# Patient Record
Sex: Female | Born: 1935
Health system: Southern US, Community
[De-identification: ages and names within clinical notes are randomized; demographics above are authoritative.]

## PROBLEM LIST (undated history)

## (undated) DIAGNOSIS — N182 Chronic kidney disease, stage 2 (mild): Secondary | ICD-10-CM

## (undated) DIAGNOSIS — I10 Essential (primary) hypertension: Secondary | ICD-10-CM

## (undated) DIAGNOSIS — F419 Anxiety disorder, unspecified: Secondary | ICD-10-CM

## (undated) DIAGNOSIS — I639 Cerebral infarction, unspecified: Secondary | ICD-10-CM

## (undated) DIAGNOSIS — I4891 Unspecified atrial fibrillation: Secondary | ICD-10-CM

## (undated) DIAGNOSIS — K449 Diaphragmatic hernia without obstruction or gangrene: Secondary | ICD-10-CM

## (undated) DIAGNOSIS — K589 Irritable bowel syndrome without diarrhea: Secondary | ICD-10-CM

## (undated) DIAGNOSIS — K219 Gastro-esophageal reflux disease without esophagitis: Secondary | ICD-10-CM

## (undated) DIAGNOSIS — E119 Type 2 diabetes mellitus without complications: Secondary | ICD-10-CM

## (undated) DIAGNOSIS — E785 Hyperlipidemia, unspecified: Secondary | ICD-10-CM

## (undated) DIAGNOSIS — I69320 Aphasia following cerebral infarction: Secondary | ICD-10-CM

## (undated) DIAGNOSIS — E559 Vitamin D deficiency, unspecified: Secondary | ICD-10-CM

## (undated) HISTORY — DX: Aphasia following cerebral infarction: I69.320

## (undated) HISTORY — PX: CATARACT EXTRACTION, BILATERAL: SHX1313

## (undated) HISTORY — DX: Unspecified atrial fibrillation: I48.91

## (undated) HISTORY — DX: Essential (primary) hypertension: I10

## (undated) HISTORY — DX: Gastro-esophageal reflux disease without esophagitis: K21.9

## (undated) HISTORY — DX: Cerebral infarction, unspecified: I63.9

## (undated) HISTORY — DX: Diaphragmatic hernia without obstruction or gangrene: K44.9

## (undated) HISTORY — DX: Hyperlipidemia, unspecified: E78.5

## (undated) HISTORY — DX: Type 2 diabetes mellitus without complications: E11.9

## (undated) HISTORY — PX: CHOLECYSTECTOMY: SHX55

## (undated) HISTORY — PX: TONSILLECTOMY: SUR1361

## (undated) HISTORY — DX: Irritable bowel syndrome, unspecified: K58.9

## (undated) HISTORY — DX: Vitamin D deficiency, unspecified: E55.9

## (undated) HISTORY — DX: Anxiety disorder, unspecified: F41.9

---

## 1997-10-09 ENCOUNTER — Other Ambulatory Visit: Admission: RE | Admit: 1997-10-09 | Discharge: 1997-10-09 | Payer: Self-pay | Admitting: Internal Medicine

## 1997-11-02 ENCOUNTER — Other Ambulatory Visit: Admission: RE | Admit: 1997-11-02 | Discharge: 1997-11-02 | Payer: Self-pay | Admitting: Internal Medicine

## 1997-11-19 ENCOUNTER — Ambulatory Visit (HOSPITAL_COMMUNITY): Admission: RE | Admit: 1997-11-19 | Discharge: 1997-11-19 | Payer: Self-pay | Admitting: Internal Medicine

## 1998-12-09 ENCOUNTER — Encounter: Payer: Self-pay | Admitting: Internal Medicine

## 1998-12-09 ENCOUNTER — Ambulatory Visit (HOSPITAL_COMMUNITY): Admission: RE | Admit: 1998-12-09 | Discharge: 1998-12-09 | Payer: Self-pay | Admitting: Internal Medicine

## 1999-05-06 ENCOUNTER — Other Ambulatory Visit: Admission: RE | Admit: 1999-05-06 | Discharge: 1999-05-06 | Payer: Self-pay | Admitting: Internal Medicine

## 1999-12-12 ENCOUNTER — Ambulatory Visit (HOSPITAL_COMMUNITY): Admission: RE | Admit: 1999-12-12 | Discharge: 1999-12-12 | Payer: Self-pay | Admitting: Internal Medicine

## 1999-12-12 ENCOUNTER — Encounter: Payer: Self-pay | Admitting: Internal Medicine

## 2000-12-13 ENCOUNTER — Encounter: Payer: Self-pay | Admitting: Internal Medicine

## 2000-12-13 ENCOUNTER — Ambulatory Visit (HOSPITAL_COMMUNITY): Admission: RE | Admit: 2000-12-13 | Discharge: 2000-12-13 | Payer: Self-pay | Admitting: Internal Medicine

## 2001-12-26 ENCOUNTER — Ambulatory Visit (HOSPITAL_COMMUNITY): Admission: RE | Admit: 2001-12-26 | Discharge: 2001-12-26 | Payer: Self-pay | Admitting: Internal Medicine

## 2001-12-26 ENCOUNTER — Encounter: Payer: Self-pay | Admitting: Internal Medicine

## 2002-12-31 ENCOUNTER — Ambulatory Visit (HOSPITAL_COMMUNITY): Admission: RE | Admit: 2002-12-31 | Discharge: 2002-12-31 | Payer: Self-pay | Admitting: Internal Medicine

## 2002-12-31 ENCOUNTER — Encounter: Payer: Self-pay | Admitting: Internal Medicine

## 2003-05-30 HISTORY — PX: COLONOSCOPY: SHX174

## 2003-05-30 HISTORY — PX: UPPER GASTROINTESTINAL ENDOSCOPY: SHX188

## 2004-01-04 ENCOUNTER — Ambulatory Visit (HOSPITAL_COMMUNITY): Admission: RE | Admit: 2004-01-04 | Discharge: 2004-01-04 | Payer: Self-pay | Admitting: Internal Medicine

## 2004-01-05 ENCOUNTER — Ambulatory Visit (HOSPITAL_COMMUNITY): Admission: RE | Admit: 2004-01-05 | Discharge: 2004-01-05 | Payer: Self-pay | Admitting: Gastroenterology

## 2004-01-21 ENCOUNTER — Ambulatory Visit (HOSPITAL_COMMUNITY): Admission: RE | Admit: 2004-01-21 | Discharge: 2004-01-21 | Payer: Self-pay | Admitting: Gastroenterology

## 2004-09-06 ENCOUNTER — Other Ambulatory Visit: Admission: RE | Admit: 2004-09-06 | Discharge: 2004-09-06 | Payer: Self-pay | Admitting: Internal Medicine

## 2005-01-11 ENCOUNTER — Ambulatory Visit (HOSPITAL_COMMUNITY): Admission: RE | Admit: 2005-01-11 | Discharge: 2005-01-11 | Payer: Self-pay | Admitting: Internal Medicine

## 2006-01-12 ENCOUNTER — Ambulatory Visit (HOSPITAL_COMMUNITY): Admission: RE | Admit: 2006-01-12 | Discharge: 2006-01-12 | Payer: Self-pay | Admitting: Internal Medicine

## 2007-01-16 ENCOUNTER — Ambulatory Visit (HOSPITAL_COMMUNITY): Admission: RE | Admit: 2007-01-16 | Discharge: 2007-01-16 | Payer: Self-pay | Admitting: Internal Medicine

## 2007-06-07 ENCOUNTER — Ambulatory Visit (HOSPITAL_COMMUNITY): Admission: RE | Admit: 2007-06-07 | Discharge: 2007-06-07 | Payer: Self-pay | Admitting: Internal Medicine

## 2008-01-17 ENCOUNTER — Ambulatory Visit (HOSPITAL_COMMUNITY): Admission: RE | Admit: 2008-01-17 | Discharge: 2008-01-17 | Payer: Self-pay | Admitting: Internal Medicine

## 2009-01-18 ENCOUNTER — Ambulatory Visit (HOSPITAL_COMMUNITY): Admission: RE | Admit: 2009-01-18 | Discharge: 2009-01-18 | Payer: Self-pay | Admitting: Internal Medicine

## 2009-05-02 ENCOUNTER — Emergency Department (HOSPITAL_COMMUNITY): Admission: EM | Admit: 2009-05-02 | Discharge: 2009-05-03 | Payer: Self-pay | Admitting: Emergency Medicine

## 2010-01-20 ENCOUNTER — Ambulatory Visit (HOSPITAL_COMMUNITY): Admission: RE | Admit: 2010-01-20 | Discharge: 2010-01-20 | Payer: Self-pay | Admitting: Internal Medicine

## 2010-02-21 ENCOUNTER — Inpatient Hospital Stay (HOSPITAL_COMMUNITY): Admission: EM | Admit: 2010-02-21 | Discharge: 2010-02-25 | Payer: Self-pay | Admitting: Emergency Medicine

## 2010-02-21 ENCOUNTER — Ambulatory Visit: Payer: Self-pay | Admitting: Cardiology

## 2010-02-22 ENCOUNTER — Encounter (INDEPENDENT_AMBULATORY_CARE_PROVIDER_SITE_OTHER): Payer: Self-pay | Admitting: Diagnostic Neuroimaging

## 2010-02-22 ENCOUNTER — Ambulatory Visit: Payer: Self-pay | Admitting: Physical Medicine & Rehabilitation

## 2010-02-25 ENCOUNTER — Inpatient Hospital Stay (HOSPITAL_COMMUNITY)
Admission: EM | Admit: 2010-02-25 | Discharge: 2010-03-03 | Payer: Self-pay | Source: Home / Self Care | Admitting: Physical Medicine & Rehabilitation

## 2010-03-07 ENCOUNTER — Ambulatory Visit: Payer: Self-pay | Admitting: Cardiology

## 2010-03-07 DIAGNOSIS — Z8673 Personal history of transient ischemic attack (TIA), and cerebral infarction without residual deficits: Secondary | ICD-10-CM | POA: Insufficient documentation

## 2010-03-07 LAB — CONVERTED CEMR LAB: POC INR: 6.2

## 2010-03-08 ENCOUNTER — Encounter
Admission: RE | Admit: 2010-03-08 | Discharge: 2010-05-18 | Payer: Self-pay | Source: Home / Self Care | Attending: Physical Medicine & Rehabilitation | Admitting: Physical Medicine & Rehabilitation

## 2010-03-11 ENCOUNTER — Ambulatory Visit: Payer: Self-pay | Admitting: Cardiology

## 2010-03-15 DIAGNOSIS — I10 Essential (primary) hypertension: Secondary | ICD-10-CM | POA: Insufficient documentation

## 2010-03-15 DIAGNOSIS — I4891 Unspecified atrial fibrillation: Secondary | ICD-10-CM | POA: Insufficient documentation

## 2010-03-15 DIAGNOSIS — I69920 Aphasia following unspecified cerebrovascular disease: Secondary | ICD-10-CM | POA: Insufficient documentation

## 2010-03-15 DIAGNOSIS — F411 Generalized anxiety disorder: Secondary | ICD-10-CM | POA: Insufficient documentation

## 2010-03-15 DIAGNOSIS — E1129 Type 2 diabetes mellitus with other diabetic kidney complication: Secondary | ICD-10-CM | POA: Insufficient documentation

## 2010-03-16 ENCOUNTER — Ambulatory Visit: Payer: Self-pay | Admitting: Internal Medicine

## 2010-03-16 LAB — CONVERTED CEMR LAB: POC INR: 2.8

## 2010-03-17 ENCOUNTER — Ambulatory Visit: Payer: Self-pay | Admitting: Cardiology

## 2010-03-22 ENCOUNTER — Encounter
Admission: RE | Admit: 2010-03-22 | Discharge: 2010-03-28 | Payer: Self-pay | Source: Home / Self Care | Attending: Physical Medicine & Rehabilitation | Admitting: Physical Medicine & Rehabilitation

## 2010-03-23 ENCOUNTER — Ambulatory Visit: Payer: Self-pay | Admitting: Internal Medicine

## 2010-03-23 LAB — CONVERTED CEMR LAB: POC INR: 3.4

## 2010-03-28 ENCOUNTER — Ambulatory Visit: Payer: Self-pay | Admitting: Physical Medicine & Rehabilitation

## 2010-03-30 ENCOUNTER — Ambulatory Visit: Payer: Self-pay | Admitting: Cardiology

## 2010-04-05 ENCOUNTER — Encounter: Payer: Self-pay | Admitting: Cardiology

## 2010-04-12 ENCOUNTER — Ambulatory Visit: Payer: Self-pay | Admitting: Cardiovascular Disease

## 2010-04-12 LAB — CONVERTED CEMR LAB: POC INR: 3.3

## 2010-04-14 ENCOUNTER — Ambulatory Visit (HOSPITAL_COMMUNITY)
Admission: RE | Admit: 2010-04-14 | Discharge: 2010-04-14 | Payer: Self-pay | Source: Home / Self Care | Admitting: Physical Medicine & Rehabilitation

## 2010-04-26 ENCOUNTER — Ambulatory Visit: Payer: Self-pay | Admitting: Cardiology

## 2010-04-26 LAB — CONVERTED CEMR LAB: POC INR: 2

## 2010-05-10 ENCOUNTER — Ambulatory Visit: Payer: Self-pay | Admitting: Internal Medicine

## 2010-06-07 ENCOUNTER — Ambulatory Visit: Admission: RE | Admit: 2010-06-07 | Discharge: 2010-06-07 | Payer: Self-pay | Source: Home / Self Care

## 2010-06-18 ENCOUNTER — Encounter: Payer: Self-pay | Admitting: Physical Medicine & Rehabilitation

## 2010-06-21 ENCOUNTER — Ambulatory Visit: Admission: RE | Admit: 2010-06-21 | Discharge: 2010-06-21 | Payer: Self-pay | Source: Home / Self Care

## 2010-06-21 ENCOUNTER — Ambulatory Visit
Admission: RE | Admit: 2010-06-21 | Discharge: 2010-06-21 | Payer: Self-pay | Source: Home / Self Care | Attending: Cardiology | Admitting: Cardiology

## 2010-06-21 ENCOUNTER — Encounter: Payer: Self-pay | Admitting: Cardiology

## 2010-06-21 ENCOUNTER — Encounter
Admission: RE | Admit: 2010-06-21 | Discharge: 2010-06-28 | Payer: Self-pay | Source: Home / Self Care | Attending: Physical Medicine & Rehabilitation | Admitting: Physical Medicine & Rehabilitation

## 2010-06-28 ENCOUNTER — Ambulatory Visit
Admission: RE | Admit: 2010-06-28 | Discharge: 2010-06-28 | Payer: Self-pay | Source: Home / Self Care | Attending: Physical Medicine & Rehabilitation | Admitting: Physical Medicine & Rehabilitation

## 2010-06-28 NOTE — Medication Information (Signed)
Summary: ccn/g d  Anticoagulant Therapy  Managed by: Weston Brass, PharmD Referring MD: Daleen Squibb Supervising MD: Antoine Poche MD, [name] Indication 1: CVA Indication 2: Atrial Fibrillation Lab Used: [facility] [place] Site: [address] PT 86.4 INR POC 6.2 INR RANGE 2.0-3.0  Dietary changes: yes       Details: on nectar-thick liquids  Health status changes: no    Bleeding/hemorrhagic complications: no    Recent/future hospitalizations: yes       Details: was in hospital and inpatient rehab for CVA- likely cardioembolic; now aphasic  Any changes in medication regimen? no    Recent/future dental: no  Any missed doses?: no       Is patient compliant with meds? yes      Comments: Recent CVA (10/3). Received Coumadin 5 mg x 3 days inpatient at [facility] and 3 mg x2, 4 mg x2 since discharge.   Allergies (verified): No Known Drug Allergies  Anticoagulation Management History:      The patient is taking warfarin and comes in today for a routine follow up visit.  Positive risk factors for bleeding include an age of 75 years or older and history of CVA/TIA.  The bleeding index is 'intermediate risk'.  Positive CHADS2 values include Age > 75 years old and Prior Stroke/CVA/TIA.  Today's INR is 8.1.  Prothrombin time is 86.4.  Anticoagulation responsible provider: Antoine Poche MD, [name].  INR POC: 6.2.  Cuvette Lot#: [id number].    Anticoagulation Management Assessment/Plan:      The patient's current anticoagulation dose is Warfarin sodium 2 mg tabs: Use as directed by Anticoagualtion Clinic.  The target INR is 2.0-3.0.  The next INR is due 03/11/2010.  Anticoagulation instructions were given to patient's husband.  Results were reviewed/authorized by Weston Brass, PharmD.  She was notified by Weston Brass PharmD.         Current Anticoagulation Instructions: INR from lab- 8.1  Spoke with pt's husband.  Instructed to hold Coumadin until appt on Friday.  Go to ER with any signs of  bleeding. Weston Brass PharmD  March 07, 2010 4:16 PM

## 2010-06-28 NOTE — Medication Information (Signed)
Summary: rov/cs  Anticoagulant Therapy  Managed by: Bethena Midget, RN, BSN Referring MD: Daleen Squibb PCP: Lucky Cowboy Supervising MD: Riley Kill MD, Maisie Fus Indication 1: CVA Indication 2: Atrial Fibrillation Lab Used: [facility] [place] Site: [address] INR POC 2.1 INR RANGE 2.0-3.0  Dietary changes: no    Health status changes: no    Bleeding/hemorrhagic complications: no    Recent/future hospitalizations: no    Any changes in medication regimen? no    Recent/future dental: no  Any missed doses?: no       Is patient compliant with meds? yes       Allergies: No Known Drug Allergies  Anticoagulation Management History:      The patient is taking warfarin and comes in today for a routine follow up visit.  Positive risk factors for bleeding include an age of 75 years or older, history of CVA/TIA, and presence of serious comorbidities.  The bleeding index is 'high risk'.  Positive CHADS2 values include History of HTN, Age > 6 years old, History of Diabetes, and Prior Stroke/CVA/TIA.  Her last INR was 8.1 ratio.  Anticoagulation responsible provider: Riley Kill MD, Maisie Fus.  INR POC: 2.1.  Cuvette Lot#: [id number].  Exp: 03/2011.    Anticoagulation Management Assessment/Plan:      The patient's current anticoagulation dose is Warfarin sodium 2 mg tabs: Use as directed by Anticoagualtion Clinic.  The target INR is 2.0-3.0.  The next INR is due 04/12/2010.  Anticoagulation instructions were given to patient's husband.  Results were reviewed/authorized by Bethena Midget, RN, BSN.  She was notified by Bethena Midget, RN, BSN.         Prior Anticoagulation Instructions: INR 3.4  Skip tomorrow's dose, then take 1 tablet every day of the week, except 1/2 tablet on Sunday.  Return to clinic in 1 week.   Current Anticoagulation Instructions: INR 2.1 Continue 2mg s everyday except  1mg s on Sundays. Recheck in two week.

## 2010-06-28 NOTE — Assessment & Plan Note (Signed)
Summary: eph. gd   Visit Type:  EPH Primary Provider:  Lucky Cowboy  CC:  some LUQ cp since out of the hospital...denies any sob or edema...does state she has some headaches every now and then.  History of Present Illness: [name] returns after suffering an embolic stroke affecting her left middle 3 watery. She presented with in balance, a fascia, and difficulty swallowing.  During her hospitalization, she went into paroxysmal atrial fibrillation. We were consulted. 2-D echocardiogram showed EF of 55-60%, aortic sclerosis without stenosis, mild mitral valve regurgitation and annular calcification. She was begun on Coumadin.  She has done remarkably well since discharge. Other than her speech she has continued to do her routine activities. She was to go back to water aerobics next week. Her swallowing has improved.  Current Medications (verified): 1)  Metoprolol Tartrate 50 Mg Tabs (Metoprolol Tartrate) .... Take One Tablet By Mouth Twice A Day 2)  Warfarin Sodium 2 Mg Tabs (Warfarin Sodium) .... Use As Directed By Anticoagualtion Clinic 3)  Tylenol 325 Mg Tabs (Acetaminophen) .Marland Kitchen.. 1-2 Tabs Q 4 Hour As Needed 4)  Alprazolam 0.5 Mg Tabs (Alprazolam) .... 1/2 -1 Tab Once Daily As Needed  Allergies (verified): No Known Drug Allergies  Past History:  Past Medical History: Last updated: 03/15/2010 ATRIAL FIBRILLATION (ICD-427.31) APHASIA DUE TO CEREBROVASCULAR DISEASE (ICD-438.11) CVA (ICD-434.91) HYPERLIPIDEMIA (ICD-272.4) HYPERTENSION (ICD-401.9) ANXIETY DISORDER (ICD-300.00) DIABETES MELLITUS, TYPE II (ICD-250.00) HIATAL HERNIA (ICD-553.3)      Past Surgical History: Last updated: 03/15/2010  Upper endoscopy....2005 Colonoscopy....2005  Family History: Last updated: 03/15/2010 Family history is noncontributory  and unknown as the patient is aphasic.   Social History: Last updated: 03/15/2010  The patient is married, lives in two-level home with   two steps at  entry.  Bedroom on first level.  Does not use any tobacco  or alcohol.   Review of Systems       negative other than history of present illness  Vital Signs:  Patient profile:   77 year old female Height:      63 inches Weight:      126 pounds BMI:     22.40 Pulse rate:   67 / minute Pulse rhythm:   irregular BP sitting:   142 / 80  (left arm) Cuff size:   large  Vitals Entered By: Danielle Rankin, CMA (March 17, 2010 4:07 PM)  Physical Exam  General:  Well developed, well nourished, in no acute distress. Head:  normocephalic and atraumatic Eyes:  PERRLA/EOM intact; conjunctiva and lids normal. Neck:  Neck supple, no JVD. No masses, thyromegaly or abnormal cervical nodes. Lungs:  Clear bilaterally to auscultation and percussion. Heart:  PMI nondisplaced, irregular rate and rhythm, variable S1-S2, no carotid bruits Msk:  Back normal, normal gait. Muscle strength and tone normal. Pulses:  pulses normal in all 4 extremities Extremities:  No clubbing or cyanosis. Neurologic:  a phasic, otherwise unremarkable. Skin:  Intact without lesions or rashes. Psych:  Normal affect.   Problems:  Medical Problems Added: 1)  Dx of Hyperlipidemia-mixed  (ICD-272.4) 2)  Dx of Coumadin Therapy  (ICD-V58.61)  EKG  Procedure date:  03/17/2010  Findings:      atrial fibrillation,well-controlled ventricular rate  Impression & Recommendations:  Problem # 1:  ATRIAL FIBRILLATION (ICD-427.31) Assessment Unchanged rate control and anticoagulation our goal. Her updated medication list for this problem includes:    Metoprolol Tartrate 50 Mg Tabs (Metoprolol tartrate) .Marland Kitchen... Take one tablet by mouth twice a day  Warfarin Sodium 2 Mg Tabs (Warfarin sodium) ..... Use as directed by anticoagualtion clinic  Problem # 2:  CVA (ICD-434.91) Assessment: Unchanged  Her updated medication list for this problem includes:    Warfarin Sodium 2 Mg Tabs (Warfarin sodium) ..... Use as directed by  anticoagualtion clinic  Problem # 3:  APHASIA DUE TO CEREBROVASCULAR DISEASE (ICD-438.11) Assessment: Unchanged she has a followup with speech therapy next Monday. Her updated medication list for this problem includes:    Warfarin Sodium 2 Mg Tabs (Warfarin sodium) ..... Use as directed by anticoagualtion clinic  Problem # 4:  COUMADIN THERAPY (ICD-V58.61) Assessment: Unchanged continue to have this monitored here at our office.  Problem # 5:  HYPERLIPIDEMIA-MIXED (ICD-272.4) Assessment: Deteriorated  I have asked her to start back on her Crestor. Followup blood work with Dr.McKeown.  Her updated medication list for this problem includes:    Crestor 20 Mg Tabs (Rosuvastatin calcium) .Marland Kitchen... Take 1 tablet by mouth every evening  Patient Instructions: 1)  Your physician recommends that you schedule a follow-up appointment in: 3 months 2)  Your physician has recommended you make the following change in your medication: please start crestor 20mg  1 tab by mouth every evening Prescriptions: CRESTOR 20 MG TABS (ROSUVASTATIN CALCIUM) take 1 tablet by mouth every evening  #30 x 9   Entered by:   Ledon Snare, RN   Authorized by:   Gaylord Shih, MD, [facility]   Signed by:   Ledon Snare, RN on 03/17/2010   Method used:   Electronically to        [facility]  [phone]* (retail)       [address]       [place], [place]  [address]       Ph: [phone] or [phone]       Fax: [phone]   RxID:   [id number]

## 2010-06-28 NOTE — Medication Information (Signed)
Summary: rov/ewj  Anticoagulant Therapy  Managed by: Bethena Midget, RN, BSN Referring MD: Leanord Asal MD: Graciela Husbands MD, Viviann Spare Indication 1: CVA Indication 2: Atrial Fibrillation Lab Used: [facility] [place] Site: [address] INR POC 2.8 INR RANGE 2.0-3.0  Dietary changes: yes       Details: Eating soft diet at present post CVA, encouraged incorporating soft steamed broccolli  Health status changes: no    Bleeding/hemorrhagic complications: no    Recent/future hospitalizations: no    Any changes in medication regimen? no    Recent/future dental: no  Any missed doses?: no       Is patient compliant with meds? yes       Allergies: No Known Drug Allergies  Anticoagulation Management History:      The patient is taking warfarin and comes in today for a routine follow up visit.  Positive risk factors for bleeding include an age of 75 years or older, history of CVA/TIA, and presence of serious comorbidities.  The bleeding index is 'high risk'.  Positive CHADS2 values include History of HTN, Age > 81 years old, History of Diabetes, and Prior Stroke/CVA/TIA.  Her last INR was 8.1 ratio.  Anticoagulation responsible Burhan Barham: Graciela Husbands MD, Viviann Spare.  INR POC: 2.8.  Cuvette Lot#: [id number].  Exp: 03/2011.    Anticoagulation Management Assessment/Plan:      The patient's current anticoagulation dose is Warfarin sodium 2 mg tabs: Use as directed by Anticoagualtion Clinic.  The target INR is 2.0-3.0.  The next INR is due 03/23/2010.  Anticoagulation instructions were given to patient's husband.  Results were reviewed/authorized by Bethena Midget, RN, BSN.  She was notified by Bethena Midget, RN, BSN.         Prior Anticoagulation Instructions: INR 2.3  Start taking 2mg  daily.  Recheck on Wednesday 03/16/10.  Current Anticoagulation Instructions: INR 2.8 Continue 2mg s everyday. Recheck in one week.

## 2010-06-28 NOTE — Medication Information (Signed)
Summary: rov/sp  Anticoagulant Therapy  Managed by: Cloyde Reams, RN, BSN Referring MD: Leanord Asal MD: Riley Kill MD, Maisie Fus Indication 1: CVA Indication 2: Atrial Fibrillation Lab Used: [facility] [place] Site: [address] INR POC 2.3 INR RANGE 2.0-3.0  Dietary changes: no    Health status changes: no    Bleeding/hemorrhagic complications: no    Recent/future hospitalizations: no    Any changes in medication regimen? no    Recent/future dental: no  Any missed doses?: no       Is patient compliant with meds? yes      Comments: Pt has previously been given 29mg  in 7 days.   Allergies: No Known Drug Allergies  Anticoagulation Management History:      The patient is taking warfarin and comes in today for a routine follow up visit.  Positive risk factors for bleeding include an age of 75 years or older and history of CVA/TIA.  The bleeding index is 'intermediate risk'.  Positive CHADS2 values include Age > 46 years old and Prior Stroke/CVA/TIA.  Her last INR was 8.1 ratio.  Anticoagulation responsible provider: Riley Kill MD, Maisie Fus.  INR POC: 2.3.  Cuvette Lot#: [id number].  Exp: 03/2011.    Anticoagulation Management Assessment/Plan:      The patient's current anticoagulation dose is Warfarin sodium 2 mg tabs: Use as directed by Anticoagualtion Clinic.  The target INR is 2.0-3.0.  The next INR is due 03/16/2010.  Anticoagulation instructions were given to patient's husband.  Results were reviewed/authorized by Cloyde Reams, RN, BSN.  She was notified by Cloyde Reams, RN, BSN.         Prior Anticoagulation Instructions: INR from lab- 8.1  Spoke with pt's husband.  Instructed to hold Coumadin until appt on Friday.  Go to ER with any signs of bleeding. Weston Brass PharmD  March 07, 2010 4:16 PM   Current Anticoagulation Instructions: INR 2.3  Start taking 2mg  daily.  Recheck on Wednesday 03/16/10.

## 2010-06-28 NOTE — Medication Information (Signed)
Summary: rov/tm  Anticoagulant Therapy  Managed by: Bethena Midget, RN, BSN Referring MD: Daleen Squibb PCP: Lucky Cowboy Supervising MD: Excell Seltzer MD, Casimiro Needle Indication 1: CVA Indication 2: Atrial Fibrillation Lab Used: [facility] [place] Site: [address] INR POC 3.3 INR RANGE 2.0-3.0  Dietary changes: no    Health status changes: no    Bleeding/hemorrhagic complications: no    Recent/future hospitalizations: no    Any changes in medication regimen? no    Recent/future dental: no  Any missed doses?: no       Is patient compliant with meds? yes       Allergies: No Known Drug Allergies  Anticoagulation Management History:      The patient is taking warfarin and comes in today for a routine follow up visit.  Positive risk factors for bleeding include an age of 75 years or older, history of CVA/TIA, and presence of serious comorbidities.  The bleeding index is 'high risk'.  Positive CHADS2 values include History of HTN, Age > 58 years old, History of Diabetes, and Prior Stroke/CVA/TIA.  Her last INR was 8.1 ratio.  Anticoagulation responsible Lisa Hopkins: Excell Seltzer MD, Casimiro Needle.  INR POC: 3.3.  Cuvette Lot#: [id number].  Exp: 04/2011.    Anticoagulation Management Assessment/Plan:      The patient's current anticoagulation dose is Warfarin sodium 2 mg tabs: Use as directed by Anticoagualtion Clinic.  The target INR is 2.0-3.0.  The next INR is due 04/26/2010.  Anticoagulation instructions were given to patient's husband.  Results were reviewed/authorized by Bethena Midget, RN, BSN.  She was notified by Bethena Midget, RN, BSN.         Prior Anticoagulation Instructions: INR 2.1 Continue 2mg s everyday except  1mg s on Sundays. Recheck in two week.   Current Anticoagulation Instructions: INR 3.3 Skip Wednesday's dose then change dose to 2mg s everyday except 1mg s on Sundays and Wednesdays. Recheck in 2 weeks.

## 2010-06-28 NOTE — Medication Information (Signed)
Summary: rov/tm  Anticoagulant Therapy  Managed by: Bethena Midget, RN, BSN Referring MD: Daleen Squibb PCP: [facility] Cowboy Supervising MD: Ladona Ridgel MD, Sharlot Gowda Indication 1: CVA Indication 2: Atrial Fibrillation Lab Used: [facility] [place] Site: [address] INR POC 3.4 INR RANGE 2.0-3.0  Dietary changes: no    Health status changes: no    Bleeding/hemorrhagic complications: no    Recent/future hospitalizations: no    Any changes in medication regimen? no    Recent/future dental: no  Any missed doses?: no       Is patient compliant with meds? yes       Allergies: No Known Drug Allergies  Anticoagulation Management History:      The patient is taking warfarin and comes in today for a routine follow up visit.  Positive risk factors for bleeding include an age of 75 years or older, history of CVA/TIA, and presence of serious comorbidities.  The bleeding index is 'high risk'.  Positive CHADS2 values include History of HTN, Age > 52 years old, History of Diabetes, and Prior Stroke/CVA/TIA.  Her last INR was 8.1 ratio.  Anticoagulation responsible provider: Ladona Ridgel MD, Sharlot Gowda.  INR POC: 3.4.  Cuvette Lot#: [id number].  Exp: 04/2011.    Anticoagulation Management Assessment/Plan:      The patient's current anticoagulation dose is Warfarin sodium 2 mg tabs: Use as directed by Anticoagualtion Clinic.  The target INR is 2.0-3.0.  The next INR is due 03/30/2010.  Anticoagulation instructions were given to patient's husband.  Results were reviewed/authorized by Bethena Midget, RN, BSN.  She was notified by Haynes Hoehn, PharmD candidate.         Prior Anticoagulation Instructions: INR 2.8 Continue 2mg s everyday. Recheck in one week.   Current Anticoagulation Instructions: INR 3.4  Skip tomorrow's dose, then take 1 tablet every day of the week, except 1/2 tablet on Sunday.  Return to clinic in 1 week.

## 2010-06-28 NOTE — Medication Information (Signed)
Summary: rov/tm  Anticoagulant Therapy  Managed by: Weston Brass, PharmD Referring MD: Daleen Squibb PCP: [facility] Cowboy Supervising MD: Myrtis Ser MD, Tinnie Gens Indication 1: CVA Indication 2: Atrial Fibrillation Lab Used: [facility] [place] Site: [address] INR POC 2.0 INR RANGE 2.0-3.0  Dietary changes: no    Health status changes: no    Bleeding/hemorrhagic complications: yes       Details: blood in urine x 2 times but resolved now  Recent/future hospitalizations: no    Any changes in medication regimen? no    Recent/future dental: no  Any missed doses?: no       Is patient compliant with meds? yes       Allergies: No Known Drug Allergies  Anticoagulation Management History:      The patient is taking warfarin and comes in today for a routine follow up visit.  Positive risk factors for bleeding include an age of 75 years or older, history of CVA/TIA, and presence of serious comorbidities.  The bleeding index is 'high risk'.  Positive CHADS2 values include History of HTN, Age > 42 years old, History of Diabetes, and Prior Stroke/CVA/TIA.  Her last INR was 8.1 ratio.  Anticoagulation responsible provider: Myrtis Ser MD, Tinnie Gens.  INR POC: 2.0.  Cuvette Lot#: [id number].  Exp: 04/2011.    Anticoagulation Management Assessment/Plan:      The patient's current anticoagulation dose is Warfarin sodium 2 mg tabs: Use as directed by Anticoagualtion Clinic.  The target INR is 2.0-3.0.  The next INR is due 05/10/2010.  Anticoagulation instructions were given to patient's husband.  Results were reviewed/authorized by Weston Brass, PharmD.  She was notified by Weston Brass PharmD.         Prior Anticoagulation Instructions: INR 3.3 Skip Wednesday's dose then change dose to 2mg s everyday except 1mg s on Sundays and Wednesdays. Recheck in 2 weeks.  Current Anticoagulation Instructions: INR 2.0  Continue same dose of 1 tablet every day except 1/2 tablet on Sunday and Wednesday.  Recheck INR in  2 weeks.

## 2010-06-28 NOTE — Letter (Signed)
Summary: Generic Letter  Architectural technologist, Main Office  [address]   [place], [place] [address]   Phone: [phone]  Fax: [phone]        March 17, 2010 MRN: [id number]    Lisa Hopkins [address] [place], [place]  [address]    To Whom it May Concern---  ms Foti may return to water aerobics Wed--03/23/10       Sincerely,  Ledon Snare, RN  This letter has been electronically signed by your physician.

## 2010-06-30 NOTE — Assessment & Plan Note (Signed)
Summary: 3 month rov.sl   Visit Type:  3 mo follow up Primary Provider:  Lucky Cowboy  CC:  edema in feet. no other complaints today.  History of Present Illness: Lisa Hopkins returns today for close followup of her chronic atrial fibrillation, history of embolic stroke in September with residual aphasia, and anticoagulation.  Her speech is improved some but she still having some difficulty getting words out. Her husband and her would like to look into switching to Pradaxa. I will ask the Coumadin clinic to address.  She denies any palpitations, chest pain, presyncope or syncope. She denies any bleeding.  Current Medications (verified): 1)  Metoprolol Tartrate 50 Mg Tabs (Metoprolol Tartrate) .... Take One Tablet By Mouth Twice A Day 2)  Warfarin Sodium 2 Mg Tabs (Warfarin Sodium) .... Use As Directed By Anticoagualtion Clinic 3)  Tylenol 325 Mg Tabs (Acetaminophen) .Marland Kitchen.. 1-2 Tabs Q 4 Hour As Needed 4)  Crestor 20 Mg Tabs (Rosuvastatin Calcium) .... Take 1 Tablet By Mouth Every Evening 5)  Vitamin D3 2000 Unit Caps (Cholecalciferol) .... Once Daily 6)  Fish Oil 1000 Mg Caps (Omega-3 Fatty Acids) .... Once Daily  Allergies (verified): No Known Drug Allergies  Past History:  Past Medical History: Last updated: 03/15/2010 ATRIAL FIBRILLATION (ICD-427.31) APHASIA DUE TO CEREBROVASCULAR DISEASE (ICD-438.11) CVA (ICD-434.91) HYPERLIPIDEMIA (ICD-272.4) HYPERTENSION (ICD-401.9) ANXIETY DISORDER (ICD-300.00) DIABETES MELLITUS, TYPE II (ICD-250.00) HIATAL HERNIA (ICD-553.3)      Past Surgical History: Last updated: 03/15/2010  Upper endoscopy....2005 Colonoscopy....2005  Family History: Last updated: 03/15/2010 Family history is noncontributory  and unknown as the patient is aphasic.   Social History: Last updated: 03/15/2010  The patient is married, lives in two-level home with   two steps at entry.  Bedroom on first level.  Does not use any tobacco  or alcohol.    Review of Systems       negative other than history of present illness  Vital Signs:  Patient profile:   77 year old female Height:      63 inches Weight:      122 pounds BMI:     21.69 Pulse rate:   73 / minute Pulse rhythm:   irregular BP sitting:   122 / 66  (left arm) Cuff size:   large  Vitals Entered By: Lisa Hopkins, CMA (June 21, 2010 10:34 AM)  Physical Exam  General:  Well developed, well nourished, in no acute distress. Head:  normocephalic and atraumatic Eyes:  PERRLA/EOM intact; conjunctiva and lids normal. Neck:  Neck supple, no JVD. No masses, thyromegaly or abnormal cervical nodes. Lungs:  Clear bilaterally to auscultation and percussion. Heart:  PMI nondisplaced, irregular rate and rhythm, variable S1-S2, no carotid bruits Msk:  Back normal, normal gait. Muscle strength and tone normal. Pulses:  pulses normal in all 4 extremities Extremities:  No clubbing or cyanosis. Neurologic:  Alert and oriented x 3. Skin:  Intact without lesions or rashes. Psych:  Normal affect.   Impression & Recommendations:  Problem # 1:  ATRIAL FIBRILLATION (ICD-427.31) Assessment Unchanged  Her updated medication list for this problem includes:    Metoprolol Tartrate 50 Mg Tabs (Metoprolol tartrate) .Marland Kitchen... Take one tablet by mouth twice a day    Warfarin Sodium 2 Mg Tabs (Warfarin sodium) ..... Use as directed by anticoagualtion clinic  Orders: EKG w/ Interpretation (93000)  Problem # 2:  COUMADIN THERAPY (ICD-V58.61) Assessment: Unchanged  Problem # 3:  CVA (ICD-434.91) Assessment: Unchanged  Her updated medication list for this problem  includes:    Warfarin Sodium 2 Mg Tabs (Warfarin sodium) ..... Use as directed by anticoagualtion clinic  Patient Instructions: 1)  Your physician recommends that you schedule a follow-up appointment in: September 2012 with Dr. Daleen Squibb 2)  Your physician recommends that you continue on your current medications as directed. Please  refer to the Current Medication list given to you today.

## 2010-06-30 NOTE — Medication Information (Signed)
Summary: rov/sp  Anticoagulant Therapy  Managed by: Weston Brass, PharmD Referring MD: Daleen Squibb PCP: Lucky Cowboy Supervising MD: Shirlee Latch MD, Janyra Barillas Indication 1: CVA Indication 2: Atrial Fibrillation Lab Used: [facility] [place] Site: [address] INR RANGE 2.0-3.0  Dietary changes: no    Health status changes: no    Bleeding/hemorrhagic complications: no    Recent/future hospitalizations: no    Any changes in medication regimen? no    Recent/future dental: no  Any missed doses?: no       Is patient compliant with meds? yes       Allergies: No Known Drug Allergies  Anticoagulation Management History:      Positive risk factors for bleeding include an age of 12 years or older, history of CVA/TIA, and presence of serious comorbidities.  The bleeding index is 'high risk'.  Positive CHADS2 values include History of HTN, Age > 60 years old, History of Diabetes, and Prior Stroke/CVA/TIA.  Her last INR was 8.1 ratio.  Anticoagulation responsible provider: Shirlee Latch MD, Aileen Amore.  Cuvette Lot#: [id number].  Exp: 05/2011.    Anticoagulation Management Assessment/Plan:      The patient's current anticoagulation dose is Warfarin sodium 2 mg tabs: Use as directed by Anticoagualtion Clinic.  The target INR is 2.0-3.0.  The next INR is due 06/07/2010.  Anticoagulation instructions were given to patient's husband.  Results were reviewed/authorized by Weston Brass, PharmD.         Prior Anticoagulation Instructions: INR 2.0  Continue same dose of 1 tablet every day except 1/2 tablet on Sunday and Wednesday.  Recheck INR in 2 weeks.   Current Anticoagulation Instructions: INR 2.1 The patient is to continue with the same dose of coumadin.  This dosage includes:  Take 1/2 tablet (1mg ) on Mon and Wed Take 1 tablet (2mg ) on the rest of the days Recheck INR in 4 weeks.

## 2010-06-30 NOTE — Medication Information (Signed)
Summary: rov/mwb  Anticoagulant Therapy  Managed by: Weston Brass, PharmD Referring MD: Daleen Squibb PCP: Lucky Cowboy Supervising MD: Gala Romney MD, Reuel Boom Indication 1: CVA Indication 2: Atrial Fibrillation Lab Used: [facility] [place] Site: [address] INR POC 1.5 INR RANGE 2.0-3.0  Dietary changes: no    Health status changes: no    Bleeding/hemorrhagic complications: no    Recent/future hospitalizations: no    Any changes in medication regimen? no    Recent/future dental: no  Any missed doses?: no       Is patient compliant with meds? yes       Allergies: No Known Drug Allergies  Anticoagulation Management History:      The patient is taking warfarin and comes in today for a routine follow up visit.  Positive risk factors for bleeding include an age of 75 years or older, history of CVA/TIA, and presence of serious comorbidities.  The bleeding index is 'high risk'.  Positive CHADS2 values include History of HTN, Age > 63 years old, History of Diabetes, and Prior Stroke/CVA/TIA.  Her last INR was 8.1 ratio.  Anticoagulation responsible provider: Shaheen Star MD, Reuel Boom.  INR POC: 1.5.  Cuvette Lot#: [id number].  Exp: 05/2011.    Anticoagulation Management Assessment/Plan:      The patient's current anticoagulation dose is Warfarin sodium 2 mg tabs: Use as directed by Anticoagualtion Clinic.  The target INR is 2.0-3.0.  The next INR is due 06/21/2010.  Anticoagulation instructions were given to patient's husband.  Results were reviewed/authorized by Weston Brass, PharmD.  She was notified by Linward Headland, PharmD candidate.         Prior Anticoagulation Instructions: INR 2.1 The patient is to continue with the same dose of coumadin.  This dosage includes:  Take 1/2 tablet (1mg ) on Mon and Wed Take 1 tablet (2mg ) on the rest of the days Recheck INR in 4 weeks.   Current Anticoagulation Instructions: INR 1.5 (goal INR: 2-3)  Take an extra half tablet today.  Take 1  tablet everyday except 1/2 tablet on Sundays.  Recheck in 2 weeks

## 2010-06-30 NOTE — Medication Information (Signed)
Summary: rov  Anticoagulant Therapy  Managed by: Weston Brass, PharmD Referring MD: Daleen Squibb PCP: Lucky Cowboy Supervising MD: Eden Emms MD, Theron Arista Indication 1: CVA Indication 2: Atrial Fibrillation Lab Used: [facility] [place] Site: [address] INR POC 1.6 INR RANGE 2.0-3.0  Dietary changes: no    Health status changes: no    Bleeding/hemorrhagic complications: no    Recent/future hospitalizations: no    Any changes in medication regimen? no    Recent/future dental: no  Any missed doses?: yes     Details: Missed last Wednesday, pts husband gave her an extra half tablet on Thursday   Is patient compliant with meds? yes       Allergies: No Known Drug Allergies  Anticoagulation Management History:      The patient is taking warfarin and comes in today for a routine follow up visit.  Positive risk factors for bleeding include an age of 75 years or older, history of CVA/TIA, and presence of serious comorbidities.  The bleeding index is 'high risk'.  Positive CHADS2 values include History of HTN, Age > 75 years old, History of Diabetes, and Prior Stroke/CVA/TIA.  Her last INR was 8.1 ratio.  Anticoagulation responsible provider: Eden Emms MD, Theron Arista.  INR POC: 1.6.  Cuvette Lot#: [id number].  Exp: 05/2011.    Anticoagulation Management Assessment/Plan:      The patient's current anticoagulation dose is Warfarin sodium 2 mg tabs: Use as directed by Anticoagualtion Clinic.  The target INR is 2.0-3.0.  The next INR is due 07/01/2010.  Anticoagulation instructions were given to patient's husband.  Results were reviewed/authorized by Weston Brass, PharmD.  She was notified by Stephannie Peters, PharmD Candidate .         Prior Anticoagulation Instructions: INR 1.5 (goal INR: 2-3)  Take an extra half tablet today.  Take 1 tablet everyday except 1/2 tablet on Sundays.  Recheck in 2 weeks  Current Anticoagulation Instructions: INR 1.6 Coumadin 2 mg tablets - Take 1.5 tablets on  1/24 and 1/25, then increase dose to 1 tablet every day. Return to clinic in 2 weeks

## 2010-07-01 ENCOUNTER — Encounter (INDEPENDENT_AMBULATORY_CARE_PROVIDER_SITE_OTHER): Payer: Medicare Other

## 2010-07-01 ENCOUNTER — Encounter: Payer: Self-pay | Admitting: Cardiology

## 2010-07-01 DIAGNOSIS — Z7901 Long term (current) use of anticoagulants: Secondary | ICD-10-CM

## 2010-07-01 DIAGNOSIS — I4891 Unspecified atrial fibrillation: Secondary | ICD-10-CM

## 2010-07-06 NOTE — Medication Information (Signed)
Summary: rov/ewj  Anticoagulant Therapy  Managed by: Windell Hummingbird, RN Referring MD: Daleen Squibb PCP: Lucky Cowboy Supervising MD: Jens Som MD, Arlys John Indication 1: CVA Indication 2: Atrial Fibrillation Lab Used: [facility] [place] [facility] Site: [address] INR POC 2.4 INR RANGE 2.0-3.0  Dietary changes: no    Health status changes: no    Bleeding/hemorrhagic complications: no    Recent/future hospitalizations: no    Any changes in medication regimen? no    Recent/future dental: no  Any missed doses?: no       Is patient compliant with meds? yes       Allergies: No Known Drug Allergies  Anticoagulation Management History:      The patient is taking warfarin and comes in today for a routine follow up visit.  Positive risk factors for bleeding include an age of 75 years or older, history of CVA/TIA, and presence of serious comorbidities.  The bleeding index is 'high risk'.  Positive CHADS2 values include History of HTN, Age > 52 years old, History of Diabetes, and Prior Stroke/CVA/TIA.  Her last INR was 8.1 ratio.  Anticoagulation responsible provider: Jens Som MD, Arlys John.  INR POC: 2.4.  Cuvette Lot#: [id number].  Exp: 05/2011.    Anticoagulation Management Assessment/Plan:      The patient's current anticoagulation dose is Warfarin sodium 2 mg tabs: Use as directed by Anticoagualtion Clinic.  The target INR is 2.0-3.0.  The next INR is due 07/15/2010.  Anticoagulation instructions were given to patient's husband.  Results were reviewed/authorized by Windell Hummingbird, RN.  She was notified by Windell Hummingbird, RN.        Coagulation management information includes: cell [phone].  Prior Anticoagulation Instructions: INR 1.6 Coumadin 2 mg tablets - Take 1.5 tablets on 1/24 and 1/25, then increase dose to 1 tablet every day. Return to clinic in 2 weeks   Current Anticoagulation Instructions: INR 2.4 Continue taking 1 tablet every day. Check INR in 2 weeks.

## 2010-07-12 DIAGNOSIS — I6350 Cerebral infarction due to unspecified occlusion or stenosis of unspecified cerebral artery: Secondary | ICD-10-CM

## 2010-07-12 DIAGNOSIS — I4891 Unspecified atrial fibrillation: Secondary | ICD-10-CM

## 2010-07-15 ENCOUNTER — Encounter: Payer: Self-pay | Admitting: Cardiology

## 2010-07-15 LAB — CONVERTED CEMR LAB: POC INR: 1.8

## 2010-07-18 ENCOUNTER — Encounter: Payer: Self-pay | Admitting: Cardiology

## 2010-07-26 NOTE — Medication Information (Signed)
Summary: Coumadin Clinic  Anticoagulant Therapy  Managed by: Bethena Midget, RN, BSN Referring MD: Daleen Squibb PCP: Lucky Cowboy Supervising MD: Daleen Squibb MD, Maisie Fus Indication 1: CVA Indication 2: Atrial Fibrillation Lab Used: [facility] [place] Site: [address] PT 17.7 INR POC 1.8 INR RANGE 2.0-3.0  Dietary changes: no    Health status changes: no    Bleeding/hemorrhagic complications: no    Recent/future hospitalizations: no    Any changes in medication regimen? no    Recent/future dental: no  Any missed doses?: no       Is patient compliant with meds? yes       Allergies: No Known Drug Allergies  Anticoagulation Management History:      Her anticoagulation is being managed by telephone today.  Positive risk factors for bleeding include an age of 75 years or older, history of CVA/TIA, and presence of serious comorbidities.  The bleeding index is 'high risk'.  Positive CHADS2 values include History of HTN, Age > 90 years old, History of Diabetes, and Prior Stroke/CVA/TIA.  Her last INR was 8.1 ratio.  Prothrombin time is 17.7.  Anticoagulation responsible provider: Daleen Squibb MD, Maisie Fus.  INR POC: 1.8.    Anticoagulation Management Assessment/Plan:      The patient's current anticoagulation dose is Warfarin sodium 2 mg tabs: Use as directed by Anticoagualtion Clinic.  The target INR is 2.0-3.0.  The next INR is due 08/01/2010.  Anticoagulation instructions were given to patient's husband.  Results were reviewed/authorized by Bethena Midget, RN, BSN.  She was notified by Bethena Midget, RN, BSN.         Prior Anticoagulation Instructions: INR 2.4 Continue taking 1 tablet every day. Check INR in 2 weeks.  Current Anticoagulation Instructions: INR 1.8 LMOM to call for dosing.Bethena Midget, RN, BSN  July 18, 2010 1:04 PM  Today take 3mg s then resume 2mg s daily. Recheck in 2 weeks.

## 2010-08-01 ENCOUNTER — Encounter: Payer: Self-pay | Admitting: Cardiology

## 2010-08-01 ENCOUNTER — Encounter (INDEPENDENT_AMBULATORY_CARE_PROVIDER_SITE_OTHER): Payer: Medicare Other

## 2010-08-01 DIAGNOSIS — I4891 Unspecified atrial fibrillation: Secondary | ICD-10-CM

## 2010-08-01 DIAGNOSIS — Z7901 Long term (current) use of anticoagulants: Secondary | ICD-10-CM

## 2010-08-01 DIAGNOSIS — I6789 Other cerebrovascular disease: Secondary | ICD-10-CM

## 2010-08-09 NOTE — Medication Information (Signed)
Summary: rov/tm  Anticoagulant Therapy  Managed by: Cloyde Reams, RN, BSN Referring MD: Daleen Squibb PCP: Lucky Cowboy Supervising MD: Daleen Squibb MD, Maisie Fus Indication 1: CVA Indication 2: Atrial Fibrillation Lab Used: [facility] [place] Site: [address] INR POC 2.4 INR RANGE 2.0-3.0  Dietary changes: no    Health status changes: no    Bleeding/hemorrhagic complications: no    Recent/future hospitalizations: no    Any changes in medication regimen? no    Recent/future dental: no  Any missed doses?: no       Is patient compliant with meds? yes       Allergies: No Known Drug Allergies  Anticoagulation Management History:      The patient is taking warfarin and comes in today for a routine follow up visit.  Positive risk factors for bleeding include an age of 75 years or older, history of CVA/TIA, and presence of serious comorbidities.  The bleeding index is 'high risk'.  Positive CHADS2 values include History of HTN, Age > 38 years old, History of Diabetes, and Prior Stroke/CVA/TIA.  Her last INR was 8.1 ratio.  Anticoagulation responsible provider: Daleen Squibb MD, Maisie Fus.  INR POC: 2.4.  Cuvette Lot#: [id number].  Exp: 05/2011.    Anticoagulation Management Assessment/Plan:      The patient's current anticoagulation dose is Warfarin sodium 2 mg tabs: Use as directed by Anticoagualtion Clinic.  The target INR is 2.0-3.0.  The next INR is due 08/23/2010.  Anticoagulation instructions were given to patient's husband.  Results were reviewed/authorized by Cloyde Reams, RN, BSN.  She was notified by Cloyde Reams RN.         Prior Anticoagulation Instructions: INR 1.8 LMOM to call for dosing.Bethena Midget, RN, BSN  July 18, 2010 1:04 PM  Today take 3mg s then resume 2mg s daily. Recheck in 2 weeks.   Current Anticoagulation Instructions: INR 2.4  Continue on same dosage 2mg  daily.  Recheck in 3 weeks.

## 2010-08-11 LAB — LIPID PANEL
Cholesterol: 113 mg/dL (ref 0–200)
HDL: 43 mg/dL (ref >39)
Total CHOL/HDL Ratio: 2.6 RATIO
VLDL: 27 mg/dL (ref 0–40)

## 2010-08-11 LAB — CARDIAC PANEL(CRET KIN+CKTOT+MB+TROPI)
CK, MB: 2.1 ng/mL (ref 0.3–4.0)
Relative Index: 1.3 (ref 0.0–2.5)

## 2010-08-11 LAB — GLUCOSE, CAPILLARY
Glucose-Capillary: 104 mg/dL — ABNORMAL HIGH (ref 70–99)
Glucose-Capillary: 104 mg/dL — ABNORMAL HIGH (ref 70–99)
Glucose-Capillary: 107 mg/dL — ABNORMAL HIGH (ref 70–99)
Glucose-Capillary: 114 mg/dL — ABNORMAL HIGH (ref 70–99)
Glucose-Capillary: 133 mg/dL — ABNORMAL HIGH (ref 70–99)
Glucose-Capillary: 139 mg/dL — ABNORMAL HIGH (ref 70–99)
Glucose-Capillary: 140 mg/dL — ABNORMAL HIGH (ref 70–99)
Glucose-Capillary: 140 mg/dL — ABNORMAL HIGH (ref 70–99)
Glucose-Capillary: 149 mg/dL — ABNORMAL HIGH (ref 70–99)
Glucose-Capillary: 161 mg/dL — ABNORMAL HIGH (ref 70–99)
Glucose-Capillary: 165 mg/dL — ABNORMAL HIGH (ref 70–99)
Glucose-Capillary: 101 mg/dL — ABNORMAL HIGH (ref 70–99)
Glucose-Capillary: 102 mg/dL — ABNORMAL HIGH (ref 70–99)
Glucose-Capillary: 105 mg/dL — ABNORMAL HIGH (ref 70–99)
Glucose-Capillary: 105 mg/dL — ABNORMAL HIGH (ref 70–99)
Glucose-Capillary: 108 mg/dL — ABNORMAL HIGH (ref 70–99)
Glucose-Capillary: 110 mg/dL — ABNORMAL HIGH (ref 70–99)
Glucose-Capillary: 115 mg/dL — ABNORMAL HIGH (ref 70–99)
Glucose-Capillary: 118 mg/dL — ABNORMAL HIGH (ref 70–99)
Glucose-Capillary: 120 mg/dL — ABNORMAL HIGH (ref 70–99)
Glucose-Capillary: 95 mg/dL (ref 70–99)

## 2010-08-11 LAB — CBC
HCT: 39.7 % (ref 36.0–46.0)
HCT: 39.6 % (ref 36.0–46.0)
Hemoglobin: 13.6 g/dL (ref 12.0–15.0)
Hemoglobin: 13.5 g/dL (ref 12.0–15.0)
MCH: 31.3 pg (ref 26.0–34.0)
MCH: 31.8 pg (ref 26.0–34.0)
MCHC: 34.1 g/dL (ref 30.0–36.0)
MCHC: 34.5 g/dL (ref 30.0–36.0)
MCV: 92.5 fL (ref 78.0–100.0)
Platelets: 197 10*3/uL (ref 150–400)
RBC: 4.32 MIL/uL (ref 3.87–5.11)
RDW: 12.9 % (ref 11.5–15.5)
WBC: 6.7 10*3/uL (ref 4.0–10.5)

## 2010-08-11 LAB — PROTIME-INR
INR: 1.16 (ref 0.00–1.49)
INR: 1.36 (ref 0.00–1.49)
INR: 1.89 — ABNORMAL HIGH (ref 0.00–1.49)
Prothrombin Time: 15 seconds (ref 11.6–15.2)
Prothrombin Time: 22.6 seconds — ABNORMAL HIGH (ref 11.6–15.2)
Prothrombin Time: 29.1 seconds — ABNORMAL HIGH (ref 11.6–15.2)

## 2010-08-11 LAB — DIFFERENTIAL
Basophils Relative: 1 % (ref 0–1)
Eosinophils Absolute: 0.4 10*3/uL (ref 0.0–0.7)
Eosinophils Relative: 3 % (ref 0–5)
Lymphs Abs: 2.8 10*3/uL (ref 0.7–4.0)
Monocytes Absolute: 0.5 10*3/uL (ref 0.1–1.0)
Monocytes Relative: 8 % (ref 3–12)
Monocytes Relative: 6 % (ref 3–12)
Neutrophils Relative %: 45 % (ref 43–77)
Neutrophils Relative %: 67 % (ref 43–77)

## 2010-08-11 LAB — BASIC METABOLIC PANEL
BUN: 10 mg/dL (ref 6–23)
BUN: 11 mg/dL (ref 6–23)
CO2: 23 mEq/L (ref 19–32)
CO2: 27 mEq/L (ref 19–32)
Calcium: 8.8 mg/dL (ref 8.4–10.5)
Creatinine, Ser: 0.65 mg/dL (ref 0.4–1.2)
GFR calc non Af Amer: >60 mL/min (ref >60)
GFR calc non Af Amer: >60 mL/min (ref >60)
Glucose, Bld: 117 mg/dL — ABNORMAL HIGH (ref 70–99)
Glucose, Bld: 118 mg/dL — ABNORMAL HIGH (ref 70–99)
Potassium: 4 mEq/L (ref 3.5–5.1)
Sodium: 137 mEq/L (ref 135–145)

## 2010-08-11 LAB — CK TOTAL AND CKMB (NOT AT ARMC): Total CK: 149 U/L (ref 7–177)

## 2010-08-11 LAB — COMPREHENSIVE METABOLIC PANEL
ALT: 21 U/L (ref 0–35)
ALT: 23 U/L (ref 0–35)
CO2: 24 mEq/L (ref 19–32)
Calcium: 9.1 mg/dL (ref 8.4–10.5)
Calcium: 9.4 mg/dL (ref 8.4–10.5)
Creatinine, Ser: 0.72 mg/dL (ref 0.4–1.2)
GFR calc Af Amer: >60 mL/min (ref >60)
GFR calc non Af Amer: >60 mL/min (ref >60)
Glucose, Bld: 105 mg/dL — ABNORMAL HIGH (ref 70–99)
Glucose, Bld: 120 mg/dL — ABNORMAL HIGH (ref 70–99)
Sodium: 141 mEq/L (ref 135–145)
Total Bilirubin: 0.9 mg/dL (ref 0.3–1.2)
Total Protein: 5.7 g/dL — ABNORMAL LOW (ref 6.0–8.3)

## 2010-08-11 LAB — HEMOGLOBIN A1C: Hgb A1c MFr Bld: 6.5 % — ABNORMAL HIGH (ref <5.7)

## 2010-08-11 LAB — PHOSPHORUS: Phosphorus: 3.2 mg/dL (ref 2.3–4.6)

## 2010-08-11 LAB — APTT: aPTT: 28 seconds (ref 24–37)

## 2010-08-11 LAB — MRSA PCR SCREENING: MRSA by PCR: NEGATIVE

## 2010-08-11 LAB — TROPONIN I: Troponin I: 0.02 ng/mL (ref 0.00–0.06)

## 2010-08-11 LAB — MAGNESIUM: Magnesium: 1.9 mg/dL (ref 1.5–2.5)

## 2010-08-23 ENCOUNTER — Ambulatory Visit (INDEPENDENT_AMBULATORY_CARE_PROVIDER_SITE_OTHER): Payer: Medicare Other | Admitting: *Deleted

## 2010-08-23 DIAGNOSIS — I4891 Unspecified atrial fibrillation: Secondary | ICD-10-CM

## 2010-08-23 DIAGNOSIS — I6350 Cerebral infarction due to unspecified occlusion or stenosis of unspecified cerebral artery: Secondary | ICD-10-CM

## 2010-08-23 NOTE — Patient Instructions (Signed)
INR 2.3  Continue same dose of 1 tablet every day.  Recheck INR in 4 weeks.

## 2010-08-30 LAB — CBC
HCT: 47.9 % — ABNORMAL HIGH (ref 36.0–46.0)
Hemoglobin: 16.5 g/dL — ABNORMAL HIGH (ref 12.0–15.0)
MCHC: 34.4 g/dL (ref 30.0–36.0)
MCV: 95.9 fL (ref 78.0–100.0)
RBC: 4.99 MIL/uL (ref 3.87–5.11)
RDW: 12.7 % (ref 11.5–15.5)

## 2010-08-30 LAB — BASIC METABOLIC PANEL
CO2: 26 mEq/L (ref 19–32)
Chloride: 102 mEq/L (ref 96–112)
GFR calc Af Amer: >60 mL/min (ref >60)
Glucose, Bld: 120 mg/dL — ABNORMAL HIGH (ref 70–99)
Sodium: 138 mEq/L (ref 135–145)

## 2010-08-30 LAB — DIFFERENTIAL
Basophils Relative: 0 % (ref 0–1)
Eosinophils Absolute: 0.1 10*3/uL (ref 0.0–0.7)
Eosinophils Relative: 2 % (ref 0–5)
Monocytes Absolute: 0.5 10*3/uL (ref 0.1–1.0)
Monocytes Relative: 6 % (ref 3–12)

## 2010-09-20 ENCOUNTER — Ambulatory Visit (INDEPENDENT_AMBULATORY_CARE_PROVIDER_SITE_OTHER): Payer: Medicare Other | Admitting: *Deleted

## 2010-09-20 DIAGNOSIS — I4891 Unspecified atrial fibrillation: Secondary | ICD-10-CM

## 2010-09-20 DIAGNOSIS — I6350 Cerebral infarction due to unspecified occlusion or stenosis of unspecified cerebral artery: Secondary | ICD-10-CM

## 2010-10-14 NOTE — Op Note (Signed)
NAME:  Lisa Hopkins, Lisa Hopkins                      ACCOUNT NO.:  [id number]   MEDICAL RECORD NO.:  [id number]                   PATIENT TYPE:  AMB   LOCATION:  ENDO                                 FACILITY:  [facility]   PHYSICIAN:  Bernette Redbird, M.D.                DATE OF BIRTH:  Apr 02, 1925   DATE OF PROCEDURE:  01/21/2004  DATE OF DISCHARGE:                                 OPERATIVE REPORT   PROCEDURE:  Colonoscopy.   INDICATIONS:  A 77 year old female for colon cancer screening.   FINDINGS:  Normal exam to the cecum.   PROCEDURE:  The nature, purpose, and risks of the procedure have been  discussed with the patient who provided written consent. Sedation was  fentanyl 50 mcg and Versed 6 mg IV prior to enduring this procedure and the  upper endoscopy which proceeded it. She had a baseline bradycardia in the  50s and went down in the mid 40s during this exam without evidence of  clinical instability.   The Olympus adjustable tension pediatric video colonoscope was advanced  through slightly fixated sigmoid region, turning the patient into the supine  position to facilitate passage. The cecum was reached with the help of some  external abdominal compression, and the cecum was identified by  visualization of the appendiceal orifice. Pull back was then performed. The  quality of the prep was excellent. It was felt that all areas were well  seen. There was minimal sigmoid diverticulosis, but this was otherwise a  normal examination, without evidence of polyps, cancer, colitis, or vascular  malformations. Retroflexion of the rectum was unremarkable.   No biopsies were obtained. The patient tolerated the procedure well, and  there were no apparent complications.   IMPRESSION:  Screening colonoscopy without worrisome findings (V12.72).  Minimal diverticulosis present.   PLAN:  Consider sigmoidoscopic reevaluation in 5 years.                                               Bernette Redbird,  M.D.    RB/MEDQ  D:  01/21/2004  T:  01/22/2004  Job:  [id number]   cc:   Lucky Cowboy, M.D.  [address], [address]  [place], [place] [address]  Fax: [phone]

## 2010-10-14 NOTE — Op Note (Signed)
NAME:  Lisa Hopkins, Lisa Hopkins                      ACCOUNT NO.:  [id number]   MEDICAL RECORD NO.:  [id number]                   PATIENT TYPE:  AMB   LOCATION:  ENDO                                 FACILITY:  [facility]   PHYSICIAN:  Bernette Redbird, M.D.                DATE OF BIRTH:  07-07-1924   DATE OF PROCEDURE:  01/21/2004  DATE OF DISCHARGE:                                 OPERATIVE REPORT   PROCEDURE:  Upper endoscopy.   INDICATIONS:  A 77 year old with abnormal appearance of the biliary tract on  CT scanning, (dilated biliary ductal system), with negative MRCP, raising  the question of the possibility of an ampullary neoplasm leading to this  clinical scenario.   FINDINGS:  Normal-appearing papilla.   PROCEDURE:  The nature, purpose, risk of the procedure were familiar to the  patient from prior examinations.  She provided written consent.  Sedation  was fentanyl 37.5 mcg and Versed 3.5 mg IV without arrhythmias or  desaturation.  The Olympus small caliber adult video endoscope was easily  passed under direct vision.  The vocal cords were very briefly seen and  appeared grossly normal.  The esophagus was readily entered and had normal  mucosa.  There was no evidence of reflux esophagitis, Barrett's esophagus,  varices, infection or neoplasia.   However, there was a huge hiatal hernia as previously noted, with the  squamocolumnar junction at about 27 cm from the mouth.  Below this was a  very large hiatal hernia, more or less flopped over on itself, thereby  making it hard to enter the abdominal portion of the stomach.  This was  ultimately accomplished.  The gastric mucosa both in the hiatal hernia pouch  and in the main portion of the stomach was normal, without evidence of any  erosions, ulcers, polyps, masses or gastritis.  A retroflexed view showed a  very patulous, (8-10 cm), diaphragmatic hiatus with the herniated portion of  the stomach superior to the lower esophageal  sphincter region, suggesting a  probable para-esophageal component to the hiatal hernia.  The pylorus,  duodenal bulb and second and third portions of the duodenum looked normal.  I was able to identify what I believe was the major papilla which had a  normal tangential appearance.  I did not see any evidence of mass effect,  tumor or note any friability in that area although of course, a  perpendicular view was not possible with the end viewing gastroscope.  We  had considered using the duodenoscope for this patient but it was felt that  with her large hiatal hernia and altered anatomy, passage of the scope out  of the hiatal hernia sac would be difficult and would expose the patient to  additional risk without probably making a difference in our clinical  impression, so the gastroscope was removed and the procedure was terminated  at that point.  No biopsies were obtained.  The  patient tolerated the  procedure well and there were no apparent complications.   IMPRESSION:  1. Abnormal appearance of the biliary system without worrisome findings of     the papilla on current examination.     Thus, it does not appear that an ampullary tumor is accounting for the     dilated ductal system.  2. Large hiatal hernia with possible para-esophageal component.   PLAN:  Proceed to screening colonoscopy.                                               Bernette Redbird, M.D.    RB/MEDQ  D:  01/21/2004  T:  01/22/2004  Job:  [id number]   cc:   Lucky Cowboy, M.D.  [address]  [place], [place] [address]  Fax: [phone]

## 2010-10-18 ENCOUNTER — Ambulatory Visit (INDEPENDENT_AMBULATORY_CARE_PROVIDER_SITE_OTHER): Payer: Medicare Other | Admitting: *Deleted

## 2010-10-18 DIAGNOSIS — I6350 Cerebral infarction due to unspecified occlusion or stenosis of unspecified cerebral artery: Secondary | ICD-10-CM

## 2010-10-18 DIAGNOSIS — I4891 Unspecified atrial fibrillation: Secondary | ICD-10-CM

## 2010-10-18 LAB — POCT INR: INR: 1.9

## 2010-11-15 ENCOUNTER — Ambulatory Visit (INDEPENDENT_AMBULATORY_CARE_PROVIDER_SITE_OTHER): Payer: Medicare Other | Admitting: *Deleted

## 2010-11-15 DIAGNOSIS — I6350 Cerebral infarction due to unspecified occlusion or stenosis of unspecified cerebral artery: Secondary | ICD-10-CM

## 2010-11-15 DIAGNOSIS — I4891 Unspecified atrial fibrillation: Secondary | ICD-10-CM

## 2010-12-02 ENCOUNTER — Other Ambulatory Visit: Payer: Self-pay | Admitting: Cardiology

## 2010-12-06 ENCOUNTER — Ambulatory Visit (INDEPENDENT_AMBULATORY_CARE_PROVIDER_SITE_OTHER): Payer: Medicare Other | Admitting: *Deleted

## 2010-12-06 DIAGNOSIS — I6350 Cerebral infarction due to unspecified occlusion or stenosis of unspecified cerebral artery: Secondary | ICD-10-CM

## 2010-12-06 DIAGNOSIS — I4891 Unspecified atrial fibrillation: Secondary | ICD-10-CM

## 2010-12-06 LAB — PROTIME-INR
INR: 5.11 (ref <1.50)
Prothrombin Time: 47.9 seconds — ABNORMAL HIGH (ref 11.6–15.2)

## 2010-12-13 ENCOUNTER — Other Ambulatory Visit (HOSPITAL_COMMUNITY): Payer: Self-pay | Admitting: Internal Medicine

## 2010-12-13 ENCOUNTER — Encounter: Payer: Medicare Other | Admitting: *Deleted

## 2010-12-13 ENCOUNTER — Ambulatory Visit (INDEPENDENT_AMBULATORY_CARE_PROVIDER_SITE_OTHER): Payer: Medicare Other | Admitting: *Deleted

## 2010-12-13 DIAGNOSIS — Z1231 Encounter for screening mammogram for malignant neoplasm of breast: Secondary | ICD-10-CM

## 2010-12-13 DIAGNOSIS — I6350 Cerebral infarction due to unspecified occlusion or stenosis of unspecified cerebral artery: Secondary | ICD-10-CM

## 2010-12-13 DIAGNOSIS — I4891 Unspecified atrial fibrillation: Secondary | ICD-10-CM

## 2010-12-13 LAB — POCT INR: INR: 3.5

## 2010-12-27 ENCOUNTER — Ambulatory Visit (INDEPENDENT_AMBULATORY_CARE_PROVIDER_SITE_OTHER): Payer: Medicare Other | Admitting: *Deleted

## 2010-12-27 DIAGNOSIS — I4891 Unspecified atrial fibrillation: Secondary | ICD-10-CM

## 2010-12-27 DIAGNOSIS — I6350 Cerebral infarction due to unspecified occlusion or stenosis of unspecified cerebral artery: Secondary | ICD-10-CM

## 2010-12-27 LAB — POCT INR: INR: 2.7

## 2011-01-06 ENCOUNTER — Encounter: Payer: Self-pay | Admitting: Cardiology

## 2011-01-23 ENCOUNTER — Ambulatory Visit (INDEPENDENT_AMBULATORY_CARE_PROVIDER_SITE_OTHER): Payer: Medicare Other | Admitting: *Deleted

## 2011-01-23 ENCOUNTER — Ambulatory Visit (HOSPITAL_COMMUNITY)
Admission: RE | Admit: 2011-01-23 | Discharge: 2011-01-23 | Disposition: A | Discharge status: Home / Self Care | Payer: Medicare Other | Source: Ambulatory Visit | Attending: Internal Medicine | Admitting: Internal Medicine

## 2011-01-23 DIAGNOSIS — I4891 Unspecified atrial fibrillation: Secondary | ICD-10-CM

## 2011-01-23 DIAGNOSIS — Z1231 Encounter for screening mammogram for malignant neoplasm of breast: Secondary | ICD-10-CM

## 2011-01-23 DIAGNOSIS — I6350 Cerebral infarction due to unspecified occlusion or stenosis of unspecified cerebral artery: Secondary | ICD-10-CM

## 2011-01-23 LAB — POCT INR: INR: 2.7

## 2011-02-02 ENCOUNTER — Encounter: Payer: Self-pay | Admitting: Cardiology

## 2011-02-02 ENCOUNTER — Ambulatory Visit (INDEPENDENT_AMBULATORY_CARE_PROVIDER_SITE_OTHER): Payer: Medicare Other | Admitting: Cardiology

## 2011-02-02 VITALS — BP 120/75 | HR 68 | Resp 14 | Ht 59.0 in | Wt 121.0 lb

## 2011-02-02 DIAGNOSIS — I4891 Unspecified atrial fibrillation: Secondary | ICD-10-CM

## 2011-02-02 DIAGNOSIS — I6350 Cerebral infarction due to unspecified occlusion or stenosis of unspecified cerebral artery: Secondary | ICD-10-CM

## 2011-02-02 NOTE — Assessment & Plan Note (Signed)
Stable. Continue anticoagulation.

## 2011-02-02 NOTE — Assessment & Plan Note (Signed)
Stable. No change in treatment. 

## 2011-02-02 NOTE — Patient Instructions (Signed)
Your physician recommends that you schedule a follow-up appointment in: as needed or in 1 year with Dr. Daleen Squibb.

## 2011-02-02 NOTE — Progress Notes (Signed)
HPI Lisa Hopkins returns today for evaluation of her chronic A. Fib and anticoagulation. She has no complaints except for occasional palpitations. He's had no symptoms of recurrent stroke or TIAs. He very compliant with her medications.  EKG today shows chronic A. Fib with a well-controlled ventricular rate. Stable. Past Medical History  Diagnosis Date  . Atrial fibrillation   . Aphasia as late effect of cerebrovascular accident   . CVA (cerebral infarction)   . Hyperlipidemia   . HTN (hypertension)   . Anxiety   . Diabetes mellitus type II   . Hiatal hernia     Past Surgical History  Procedure Date  . Upper gastrointestinal endoscopy 2005  . Colonoscopy 2005    No family history on file.  History   Social History  . Marital Status: Married    Spouse Name: N/A    Number of Children: N/A  . Years of Education: N/A   Occupational History  . Not on file.   Social History Main Topics  . Smoking status: Never Smoker   . Smokeless tobacco: Not on file  . Alcohol Use: No  . Drug Use: Not on file  . Sexually Active: Not on file   Other Topics Concern  . Not on file   Social History Narrative  . No narrative on file    No Known Allergies  Current Outpatient Prescriptions  Medication Sig Dispense Refill  . acetaminophen (TYLENOL) 325 MG tablet Take 650 mg by mouth every 4 (four) hours as needed.        . Cholecalciferol (VITAMIN D) 2000 UNITS CAPS Take 1 capsule by mouth daily.        . metoprolol (LOPRESSOR) 50 MG tablet Take 50 mg by mouth 2 (two) times daily.        . nitrofurantoin (MACRODANTIN) 100 MG capsule Take 100 mg by mouth 2 (two) times daily.        . rosuvastatin (CRESTOR) 20 MG tablet Take 20 mg by mouth every evening.        . warfarin (COUMADIN) 2 MG tablet AS DIRECTED BY  ANTICOAGULATION  CLINIC  35 tablet  3    ROS Negative other than HPI.   PE General Appearance: well developed, well nourished in no acute distress, frail HEENT: symmetrical  face, PERRLA, good dentition  Neck: no JVD, thyromegaly, or adenopathy, trachea midline Chest: symmetric without deformity Cardiac: PMI non-displaced, irregular rate and rhythm, normal S1, S2, no gallop or murmur Lung: clear to ausculation and percussion Vascular: all pulses full without bruits  Abdominal: nondistended, nontender, good bowel sounds, no HSM, no bruits Extremities: no cyanosis, clubbing or edema, no sign of DVT, no varicosities  Skin: normal color, no rashes Neuro: alert and oriented x 3, non-focal Pysch: normal affect  Filed Vitals:   02/02/11 0947  BP: 120/75  Pulse: 68  Resp: 14  Height: 4\' 11"  (1.499 m)  Weight: 121 lb (54.885 kg)    EKG  Labs and Studies Reviewed.   Lab Results  Component Value Date   WBC 6.7 02/28/2010   HGB 13.6 02/28/2010   HCT 39.7 02/28/2010   MCV 92.5 02/28/2010   PLT 247 02/28/2010      Chemistry      Component Value Date/Time   NA 141 02/28/2010 0549   K 3.5 02/28/2010 0549   CL 107 02/28/2010 0549   CO2 27 02/28/2010 0549   BUN 13 02/28/2010 0549   CREATININE 0.66 02/28/2010 0549  Component Value Date/Time   CALCIUM 9.1 02/28/2010 0549   ALKPHOS 64 02/28/2010 0549   AST 26 02/28/2010 0549   ALT 21 02/28/2010 0549   BILITOT 0.6 02/28/2010 0549       Lab Results  Component Value Date   CHOL  Value: 113        ATP III CLASSIFICATION:  <200     mg/dL   Desirable  409-811  mg/dL   Borderline High  >=914    mg/dL   High        7/82/9562   Lab Results  Component Value Date   HDL 43 02/22/2010   Lab Results  Component Value Date   LDLCALC  Value: 43        Total Cholesterol/HDL:CHD Risk Coronary Heart Disease Risk Table                     Men   Women  1/2 Average Risk   3.4   3.3  Average Risk       5.0   4.4  2 X Average Risk   9.6   7.1  3 X Average Risk  23.4   11.0        Use the calculated Patient Ratio above and the CHD Risk Table to determine the patient's CHD Risk.        ATP III CLASSIFICATION (LDL):  <100     mg/dL    Optimal  130-865  mg/dL   Near or Above                    Optimal  130-159  mg/dL   Borderline  784-696  mg/dL   High  >295     mg/dL   Very High 2/84/1324   Lab Results  Component Value Date   TRIG 136 02/22/2010   Lab Results  Component Value Date   CHOLHDL 2.6 02/22/2010   Lab Results  Component Value Date   HGBA1C  Value: 6.5 (NOTE)                                                                       According to the ADA Clinical Practice Recommendations for 2011, when HbA1c is used as a screening test:   >=6.5%   Diagnostic of Diabetes Mellitus           (if abnormal result  is confirmed)  5.7-6.4%   Increased risk of developing Diabetes Mellitus  References:Diagnosis and Classification of Diabetes Mellitus,Diabetes Care,2011,34(Suppl 1):S62-S69 and Standards of Medical Care in         Diabetes - 2011,Diabetes Care,2011,34  (Suppl 1):S11-S61.* 02/21/2010   Lab Results  Component Value Date   ALT 21 02/28/2010   AST 26 02/28/2010   ALKPHOS 64 02/28/2010   BILITOT 0.6 02/28/2010   No results found for this basename: TSH

## 2011-02-14 ENCOUNTER — Encounter: Payer: Medicare Other | Attending: Physical Medicine & Rehabilitation | Admitting: Physical Medicine & Rehabilitation

## 2011-02-14 DIAGNOSIS — I6330 Cerebral infarction due to thrombosis of unspecified cerebral artery: Secondary | ICD-10-CM

## 2011-02-14 DIAGNOSIS — I69920 Aphasia following unspecified cerebrovascular disease: Secondary | ICD-10-CM | POA: Insufficient documentation

## 2011-02-14 DIAGNOSIS — R482 Apraxia: Secondary | ICD-10-CM | POA: Insufficient documentation

## 2011-02-14 NOTE — Assessment & Plan Note (Signed)
HISTORY:  Lisa Hopkins is back regarding her left MCA infarct.  She had been doing quite well.  Still having some language issues, but is working through these and really doing quite well in general.  She had some concerns over family members suggesting that she was transferring out of her car too slowly.  She is driving during the day without any issues. She does some exercises on her own for her speech.  REVIEW OF SYSTEMS:  Notable for the above.  Full 12-point review is written health and history section of the chart.  Does report some intermittent dizziness.  SOCIAL HISTORY:  The patient is married, living with her husband here today again.  PHYSICAL EXAMINATION:  VITAL SIGNS:  Blood pressure is 134/71, pulse 68, respiratory rate 18, and she is satting 95% on room air. GENERAL:  The patient is pleasant and alert.  She has some word-finding deficits and occasionally substituting correctly words.  The most part when she takes her time she does very well with her expression.  She has good balance today and strength is 5/5. HEART:  Regular. CHEST:  Clear. ABDOMEN:  Soft and nontender.  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  PLAN: 1. Really have nothing new to offer.  I gave her written prescription     which she states her ability to drive locally during the day     without supervision. 2. Recommend ongoing speech and language exercises at home.  She will     call with any problems or questions.     Ranelle Oyster, M.D. Electronically Signed    ZTS/MedQ D:  02/14/2011 11:14:02  T:  02/14/2011 14:46:15  Job #:  [id number]  cc:   Lucky Cowboy, M.D. Fax: [phone]

## 2011-02-21 ENCOUNTER — Ambulatory Visit (INDEPENDENT_AMBULATORY_CARE_PROVIDER_SITE_OTHER): Payer: Medicare Other | Admitting: *Deleted

## 2011-02-21 DIAGNOSIS — I4891 Unspecified atrial fibrillation: Secondary | ICD-10-CM

## 2011-02-21 DIAGNOSIS — I6350 Cerebral infarction due to unspecified occlusion or stenosis of unspecified cerebral artery: Secondary | ICD-10-CM

## 2011-02-28 ENCOUNTER — Ambulatory Visit (INDEPENDENT_AMBULATORY_CARE_PROVIDER_SITE_OTHER): Payer: Medicare Other | Admitting: *Deleted

## 2011-02-28 DIAGNOSIS — I6350 Cerebral infarction due to unspecified occlusion or stenosis of unspecified cerebral artery: Secondary | ICD-10-CM

## 2011-02-28 DIAGNOSIS — I4891 Unspecified atrial fibrillation: Secondary | ICD-10-CM

## 2011-03-25 ENCOUNTER — Other Ambulatory Visit: Payer: Self-pay | Admitting: Cardiology

## 2011-03-28 ENCOUNTER — Ambulatory Visit (INDEPENDENT_AMBULATORY_CARE_PROVIDER_SITE_OTHER): Payer: Medicare Other | Admitting: *Deleted

## 2011-03-28 DIAGNOSIS — I4891 Unspecified atrial fibrillation: Secondary | ICD-10-CM

## 2011-03-28 DIAGNOSIS — I6350 Cerebral infarction due to unspecified occlusion or stenosis of unspecified cerebral artery: Secondary | ICD-10-CM

## 2011-05-09 ENCOUNTER — Encounter: Payer: Medicare Other | Admitting: *Deleted

## 2011-05-10 ENCOUNTER — Ambulatory Visit (INDEPENDENT_AMBULATORY_CARE_PROVIDER_SITE_OTHER): Payer: Medicare Other | Admitting: *Deleted

## 2011-05-10 DIAGNOSIS — I6350 Cerebral infarction due to unspecified occlusion or stenosis of unspecified cerebral artery: Secondary | ICD-10-CM

## 2011-05-10 DIAGNOSIS — I4891 Unspecified atrial fibrillation: Secondary | ICD-10-CM

## 2011-05-10 LAB — POCT INR: INR: 2.4

## 2011-06-20 ENCOUNTER — Ambulatory Visit (INDEPENDENT_AMBULATORY_CARE_PROVIDER_SITE_OTHER): Payer: Medicare Other

## 2011-06-20 DIAGNOSIS — I4891 Unspecified atrial fibrillation: Secondary | ICD-10-CM

## 2011-06-20 DIAGNOSIS — I6350 Cerebral infarction due to unspecified occlusion or stenosis of unspecified cerebral artery: Secondary | ICD-10-CM

## 2011-08-08 ENCOUNTER — Ambulatory Visit (INDEPENDENT_AMBULATORY_CARE_PROVIDER_SITE_OTHER): Payer: Medicare Other | Admitting: Pharmacist

## 2011-08-08 DIAGNOSIS — I4891 Unspecified atrial fibrillation: Secondary | ICD-10-CM

## 2011-08-08 DIAGNOSIS — I6350 Cerebral infarction due to unspecified occlusion or stenosis of unspecified cerebral artery: Secondary | ICD-10-CM

## 2011-09-14 ENCOUNTER — Ambulatory Visit (INDEPENDENT_AMBULATORY_CARE_PROVIDER_SITE_OTHER): Payer: Medicare Other

## 2011-09-14 DIAGNOSIS — I6350 Cerebral infarction due to unspecified occlusion or stenosis of unspecified cerebral artery: Secondary | ICD-10-CM

## 2011-09-14 DIAGNOSIS — I4891 Unspecified atrial fibrillation: Secondary | ICD-10-CM

## 2011-09-14 LAB — POCT INR: INR: 1.5

## 2011-10-05 ENCOUNTER — Ambulatory Visit (INDEPENDENT_AMBULATORY_CARE_PROVIDER_SITE_OTHER): Payer: Medicare Other | Admitting: *Deleted

## 2011-10-05 DIAGNOSIS — I6350 Cerebral infarction due to unspecified occlusion or stenosis of unspecified cerebral artery: Secondary | ICD-10-CM

## 2011-10-05 DIAGNOSIS — I4891 Unspecified atrial fibrillation: Secondary | ICD-10-CM

## 2011-10-05 LAB — POCT INR: INR: 3

## 2011-10-10 ENCOUNTER — Other Ambulatory Visit: Payer: Self-pay | Admitting: Cardiology

## 2011-10-26 ENCOUNTER — Ambulatory Visit (INDEPENDENT_AMBULATORY_CARE_PROVIDER_SITE_OTHER): Payer: Medicare Other

## 2011-10-26 DIAGNOSIS — I4891 Unspecified atrial fibrillation: Secondary | ICD-10-CM

## 2011-10-26 DIAGNOSIS — I6350 Cerebral infarction due to unspecified occlusion or stenosis of unspecified cerebral artery: Secondary | ICD-10-CM

## 2011-10-26 LAB — POCT INR: INR: 3.4

## 2011-11-09 ENCOUNTER — Encounter (HOSPITAL_COMMUNITY): Payer: Self-pay | Admitting: Emergency Medicine

## 2011-11-09 ENCOUNTER — Emergency Department (HOSPITAL_COMMUNITY)
Admission: EM | Admit: 2011-11-09 | Discharge: 2011-11-09 | Disposition: A | Discharge status: Home / Self Care | Payer: Medicare Other | Attending: Emergency Medicine | Admitting: Emergency Medicine

## 2011-11-09 DIAGNOSIS — Z8673 Personal history of transient ischemic attack (TIA), and cerebral infarction without residual deficits: Secondary | ICD-10-CM | POA: Insufficient documentation

## 2011-11-09 DIAGNOSIS — E785 Hyperlipidemia, unspecified: Secondary | ICD-10-CM | POA: Insufficient documentation

## 2011-11-09 DIAGNOSIS — G589 Mononeuropathy, unspecified: Secondary | ICD-10-CM | POA: Insufficient documentation

## 2011-11-09 DIAGNOSIS — Z7901 Long term (current) use of anticoagulants: Secondary | ICD-10-CM | POA: Insufficient documentation

## 2011-11-09 DIAGNOSIS — G629 Polyneuropathy, unspecified: Secondary | ICD-10-CM

## 2011-11-09 DIAGNOSIS — E119 Type 2 diabetes mellitus without complications: Secondary | ICD-10-CM | POA: Insufficient documentation

## 2011-11-09 DIAGNOSIS — I4891 Unspecified atrial fibrillation: Secondary | ICD-10-CM | POA: Insufficient documentation

## 2011-11-09 DIAGNOSIS — I10 Essential (primary) hypertension: Secondary | ICD-10-CM | POA: Insufficient documentation

## 2011-11-09 DIAGNOSIS — Z79899 Other long term (current) drug therapy: Secondary | ICD-10-CM | POA: Insufficient documentation

## 2011-11-09 DIAGNOSIS — R209 Unspecified disturbances of skin sensation: Secondary | ICD-10-CM | POA: Insufficient documentation

## 2011-11-09 LAB — DIFFERENTIAL
Basophils Relative: 1 % (ref 0–1)
Lymphs Abs: 3 10*3/uL (ref 0.7–4.0)
Monocytes Absolute: 0.7 10*3/uL (ref 0.1–1.0)
Monocytes Relative: 10 % (ref 3–12)
Neutro Abs: 2.7 10*3/uL (ref 1.7–7.7)

## 2011-11-09 LAB — CARDIAC PANEL(CRET KIN+CKTOT+MB+TROPI)
CK, MB: 2.1 ng/mL (ref 0.3–4.0)
Troponin I: <0.3 ng/mL (ref <0.30)

## 2011-11-09 LAB — BASIC METABOLIC PANEL
BUN: 18 mg/dL (ref 6–23)
Chloride: 100 mEq/L (ref 96–112)
Creatinine, Ser: 0.83 mg/dL (ref 0.50–1.10)
GFR calc Af Amer: 73 mL/min — ABNORMAL LOW (ref >90)
Glucose, Bld: 106 mg/dL — ABNORMAL HIGH (ref 70–99)

## 2011-11-09 LAB — CBC
HCT: 45.9 % (ref 36.0–46.0)
Hemoglobin: 15.6 g/dL — ABNORMAL HIGH (ref 12.0–15.0)
MCH: 31.8 pg (ref 26.0–34.0)
MCHC: 34 g/dL (ref 30.0–36.0)

## 2011-11-09 LAB — PROTIME-INR: INR: 2.15 — ABNORMAL HIGH (ref 0.00–1.49)

## 2011-11-09 NOTE — ED Notes (Signed)
Pt. Is alert and oriented, denies pain but claimed of numbness on left arm, skin intact.

## 2011-11-09 NOTE — ED Notes (Signed)
Patient states that she has had numbness and tinglining to her left arm. The patient reports that it started last night and continued until this am. The patient denies any of these complaints at this time.

## 2011-11-09 NOTE — Discharge Instructions (Signed)
As discussed, it is very important that you speak with your physician tomorrow to insure appropriate ongoing care of your neuropathy.  Please be sure to discuss how he did overnight, and to consider additional medication he continues for relief of your condition.  You have a neuropathy of the ulnar nerve.

## 2011-11-09 NOTE — ED Provider Notes (Signed)
History     CSN: [id number]  Arrival date & time 11/09/11  1732   First MD Initiated Contact with Patient 11/09/11 2101      Chief Complaint  Patient presents with  . Numbness    (Consider location/radiation/quality/duration/timing/severity/associated sxs/prior treatment) HPI The patient presents with concerns of numbness in her left arm.  She notes that twice in the past 24 hours, with no current symptoms, she has had dysesthesia throughout the left arm.  The dysesthesia pins from the shoulder to the distal edge of the fourth and fifth digits.  She denies any pain, weakness, chest pain, dyspnea, lightheadedness. She states that she frequently falls asleep with her head in full flexion or cocked to the side.  As she has no current Sx, there have been no attempts at medication. Past Medical History  Diagnosis Date  . Atrial fibrillation   . Aphasia as late effect of cerebrovascular accident   . CVA (cerebral infarction)   . Hyperlipidemia   . HTN (hypertension)   . Anxiety   . Diabetes mellitus type II   . Hiatal hernia     Past Surgical History  Procedure Date  . Upper gastrointestinal endoscopy 2005  . Colonoscopy 2005    History reviewed. No pertinent family history.  History  Substance Use Topics  . Smoking status: Never Smoker   . Smokeless tobacco: Not on file  . Alcohol Use: No    OB History    Grav Para Term Preterm Abortions TAB SAB Ect Mult Living                  Review of Systems  Constitutional:       HPI  HENT:       HPI otherwise negative  Eyes: Negative.   Respiratory:       HPI, otherwise negative  Cardiovascular:       HPI, otherwise nmegative  Gastrointestinal: Negative for vomiting.  Genitourinary:       HPI, otherwise negative  Musculoskeletal:       HPI, otherwise negative  Skin: Negative.   Neurological: Negative for syncope.    Allergies  Review of patient's allergies indicates no known allergies.  Home Medications    Current Outpatient Rx  Name Route Sig Dispense Refill  . ACETAMINOPHEN 325 MG PO TABS Oral Take 650 mg by mouth every 4 (four) hours as needed.      Marland Kitchen VITAMIN D 2000 UNITS PO CAPS Oral Take 1 capsule by mouth daily.      Marland Kitchen METOPROLOL TARTRATE 50 MG PO TABS Oral Take 50 mg by mouth 2 (two) times daily.      Marland Kitchen ROSUVASTATIN CALCIUM 20 MG PO TABS Oral Take 20 mg by mouth every evening.      . WARFARIN SODIUM 2 MG PO TABS Oral Take 1-2 mg by mouth See admin instructions. 2mg  daily except Friday takes 1mg       BP 164/80  Pulse 77  Temp 97.8 F (36.6 C) (Oral)  Resp 17  SpO2 96%  Physical Exam  Nursing note and vitals reviewed. Constitutional: She is oriented to person, place, and time. She appears well-developed and well-nourished. No distress.  HENT:  Head: Normocephalic and atraumatic.  Eyes: Conjunctivae and EOM are normal.  Cardiovascular: Normal rate.  An irregularly irregular rhythm present.  Pulmonary/Chest: Effort normal and breath sounds normal. No stridor. No respiratory distress.  Abdominal: She exhibits no distension.  Musculoskeletal: She exhibits no edema.  Neurological: She is  alert and oriented to person, place, and time. She has normal strength. She displays no atrophy and no tremor. No cranial nerve deficit or sensory deficit. She displays no seizure activity. Coordination normal.       She notes the dysesthesia throughout the left ulnar nerve distribution, but has objective sensation intact  Skin: Skin is warm and dry.  Psychiatric: She has a normal mood and affect.    ED Course  Procedures (including critical care time)  Labs Reviewed  APTT - Abnormal; Notable for the following:    aPTT 45 (*)     All other components within normal limits  PROTIME-INR - Abnormal; Notable for the following:    Prothrombin Time 24.4 (*)     INR 2.15 (*)     All other components within normal limits  CBC - Abnormal; Notable for the following:    Hemoglobin 15.6 (*)     All  other components within normal limits  DIFFERENTIAL - Abnormal; Notable for the following:    Neutrophils Relative 41 (*)     All other components within normal limits  BASIC METABOLIC PANEL - Abnormal; Notable for the following:    Glucose, Bld 106 (*)     GFR calc non Af Amer 63 (*)     GFR calc Af Amer 73 (*)     All other components within normal limits  CARDIAC PANEL(CRET KIN+CKTOT+MB+TROPI)   No results found.   1. Neuropathy      Date: 11/09/2011  Rate: 84  Rhythm: atrial fibrillation  QRS Axis: right  Intervals: afib  ST/T Wave abnormalities: nonspecific T wave changes  Conduction Disutrbances:none  Narrative Interpretation:   Old EKG Reviewed: unchanged  ABNORMAL  Cardiac: 75afib, abnormal  Pulse ox: 99% ra, normal  MDM  This very pleasant elderly female presents with concerns over new dysesthesia in her left arm.  Given the patient's description, and insistence on symptoms only throughout his duration of the ulnar nerve there is suspicion of radiculopathy.  Although the patient is A. fib, she is appropriately anticoagulated and absence of any other notable complaints or physical exam findings his reassuring.  I discussed, at length, with the patient and her family members the need for further evaluation and management, specifically therapy possibly via orthopedics or her primary care physician to decrease inflammation.  The patient cannot take ibuprofen, was advised to use ice packs while continuing to have her condition managed by her other physicians.      Gerhard Munch, MD 11/09/11 2355

## 2011-11-16 ENCOUNTER — Ambulatory Visit (INDEPENDENT_AMBULATORY_CARE_PROVIDER_SITE_OTHER): Payer: Medicare Other | Admitting: *Deleted

## 2011-11-16 DIAGNOSIS — I4891 Unspecified atrial fibrillation: Secondary | ICD-10-CM

## 2011-11-16 DIAGNOSIS — I6350 Cerebral infarction due to unspecified occlusion or stenosis of unspecified cerebral artery: Secondary | ICD-10-CM

## 2011-12-07 ENCOUNTER — Ambulatory Visit (INDEPENDENT_AMBULATORY_CARE_PROVIDER_SITE_OTHER): Payer: Medicare Other

## 2011-12-07 DIAGNOSIS — I6350 Cerebral infarction due to unspecified occlusion or stenosis of unspecified cerebral artery: Secondary | ICD-10-CM

## 2011-12-07 DIAGNOSIS — I4891 Unspecified atrial fibrillation: Secondary | ICD-10-CM

## 2012-01-02 ENCOUNTER — Other Ambulatory Visit (HOSPITAL_COMMUNITY): Payer: Self-pay | Admitting: Internal Medicine

## 2012-01-02 DIAGNOSIS — Z1231 Encounter for screening mammogram for malignant neoplasm of breast: Secondary | ICD-10-CM

## 2012-01-04 ENCOUNTER — Ambulatory Visit (INDEPENDENT_AMBULATORY_CARE_PROVIDER_SITE_OTHER): Payer: Medicare Other | Admitting: *Deleted

## 2012-01-04 DIAGNOSIS — I6350 Cerebral infarction due to unspecified occlusion or stenosis of unspecified cerebral artery: Secondary | ICD-10-CM

## 2012-01-04 DIAGNOSIS — I4891 Unspecified atrial fibrillation: Secondary | ICD-10-CM

## 2012-01-18 ENCOUNTER — Ambulatory Visit (INDEPENDENT_AMBULATORY_CARE_PROVIDER_SITE_OTHER): Payer: Medicare Other | Admitting: Pharmacist

## 2012-01-18 DIAGNOSIS — I6350 Cerebral infarction due to unspecified occlusion or stenosis of unspecified cerebral artery: Secondary | ICD-10-CM

## 2012-01-18 DIAGNOSIS — I4891 Unspecified atrial fibrillation: Secondary | ICD-10-CM

## 2012-01-18 LAB — POCT INR: INR: 2.3

## 2012-01-26 ENCOUNTER — Ambulatory Visit (HOSPITAL_COMMUNITY)
Admission: RE | Admit: 2012-01-26 | Discharge: 2012-01-26 | Disposition: A | Discharge status: Home / Self Care | Payer: Medicare Other | Source: Ambulatory Visit | Attending: Internal Medicine | Admitting: Internal Medicine

## 2012-01-26 DIAGNOSIS — Z1231 Encounter for screening mammogram for malignant neoplasm of breast: Secondary | ICD-10-CM | POA: Insufficient documentation

## 2012-02-15 ENCOUNTER — Ambulatory Visit (INDEPENDENT_AMBULATORY_CARE_PROVIDER_SITE_OTHER): Payer: Medicare Other

## 2012-02-15 DIAGNOSIS — I6350 Cerebral infarction due to unspecified occlusion or stenosis of unspecified cerebral artery: Secondary | ICD-10-CM

## 2012-02-15 DIAGNOSIS — I4891 Unspecified atrial fibrillation: Secondary | ICD-10-CM

## 2012-02-15 LAB — POCT INR: INR: 2.7

## 2012-03-12 ENCOUNTER — Encounter (HOSPITAL_COMMUNITY): Payer: Self-pay | Admitting: *Deleted

## 2012-03-12 ENCOUNTER — Inpatient Hospital Stay (HOSPITAL_COMMUNITY)
Admission: EM | Admit: 2012-03-12 | Discharge: 2012-03-17 | DRG: 392 | Disposition: A | Discharge status: Home Health | Payer: Medicare Other | Attending: Internal Medicine | Admitting: Internal Medicine

## 2012-03-12 ENCOUNTER — Observation Stay (HOSPITAL_COMMUNITY): Payer: Medicare Other

## 2012-03-12 DIAGNOSIS — D689 Coagulation defect, unspecified: Secondary | ICD-10-CM

## 2012-03-12 DIAGNOSIS — K922 Gastrointestinal hemorrhage, unspecified: Secondary | ICD-10-CM

## 2012-03-12 DIAGNOSIS — Z8673 Personal history of transient ischemic attack (TIA), and cerebral infarction without residual deficits: Secondary | ICD-10-CM | POA: Diagnosis present

## 2012-03-12 DIAGNOSIS — E1129 Type 2 diabetes mellitus with other diabetic kidney complication: Secondary | ICD-10-CM | POA: Diagnosis present

## 2012-03-12 DIAGNOSIS — E860 Dehydration: Secondary | ICD-10-CM

## 2012-03-12 DIAGNOSIS — I69920 Aphasia following unspecified cerebrovascular disease: Secondary | ICD-10-CM

## 2012-03-12 DIAGNOSIS — I4891 Unspecified atrial fibrillation: Secondary | ICD-10-CM

## 2012-03-12 DIAGNOSIS — F411 Generalized anxiety disorder: Secondary | ICD-10-CM

## 2012-03-12 DIAGNOSIS — K529 Noninfective gastroenteritis and colitis, unspecified: Principal | ICD-10-CM | POA: Diagnosis present

## 2012-03-12 DIAGNOSIS — K625 Hemorrhage of anus and rectum: Secondary | ICD-10-CM

## 2012-03-12 DIAGNOSIS — Z7901 Long term (current) use of anticoagulants: Secondary | ICD-10-CM

## 2012-03-12 DIAGNOSIS — R1031 Right lower quadrant pain: Secondary | ICD-10-CM

## 2012-03-12 DIAGNOSIS — I6350 Cerebral infarction due to unspecified occlusion or stenosis of unspecified cerebral artery: Secondary | ICD-10-CM

## 2012-03-12 DIAGNOSIS — I10 Essential (primary) hypertension: Secondary | ICD-10-CM | POA: Diagnosis present

## 2012-03-12 DIAGNOSIS — R1032 Left lower quadrant pain: Secondary | ICD-10-CM

## 2012-03-12 DIAGNOSIS — E119 Type 2 diabetes mellitus without complications: Secondary | ICD-10-CM | POA: Diagnosis present

## 2012-03-12 DIAGNOSIS — K449 Diaphragmatic hernia without obstruction or gangrene: Secondary | ICD-10-CM

## 2012-03-12 DIAGNOSIS — E785 Hyperlipidemia, unspecified: Secondary | ICD-10-CM

## 2012-03-12 LAB — CBC WITH DIFFERENTIAL/PLATELET
Basophils Absolute: 0 10*3/uL (ref 0.0–0.1)
Basophils Relative: 0 % (ref 0–1)
Eosinophils Absolute: 0 10*3/uL (ref 0.0–0.7)
Eosinophils Relative: 0 % (ref 0–5)
HCT: 47.5 % — ABNORMAL HIGH (ref 36.0–46.0)
Hemoglobin: 16.9 g/dL — ABNORMAL HIGH (ref 12.0–15.0)
Lymphocytes Relative: 13 % (ref 12–46)
Lymphs Abs: 1.7 10*3/uL (ref 0.7–4.0)
MCH: 33 pg (ref 26.0–34.0)
MCHC: 35.6 g/dL (ref 30.0–36.0)
MCV: 92.8 fL (ref 78.0–100.0)
Monocytes Absolute: 0.9 10*3/uL (ref 0.1–1.0)
Monocytes Relative: 7 % (ref 3–12)
Neutro Abs: 10.5 10*3/uL — ABNORMAL HIGH (ref 1.7–7.7)
Neutrophils Relative %: 80 % — ABNORMAL HIGH (ref 43–77)
Platelets: 195 10*3/uL (ref 150–400)
RBC: 5.12 MIL/uL — ABNORMAL HIGH (ref 3.87–5.11)
RDW: 12.7 % (ref 11.5–15.5)
WBC: 13.2 10*3/uL — ABNORMAL HIGH (ref 4.0–10.5)

## 2012-03-12 LAB — BASIC METABOLIC PANEL WITH GFR
BUN: 14 mg/dL (ref 6–23)
CO2: 26 meq/L (ref 19–32)
Calcium: 9.8 mg/dL (ref 8.4–10.5)
Chloride: 100 meq/L (ref 96–112)
Creatinine, Ser: 0.72 mg/dL (ref 0.50–1.10)
GFR calc Af Amer: 88 mL/min — ABNORMAL LOW
GFR calc non Af Amer: 76 mL/min — ABNORMAL LOW
Glucose, Bld: 140 mg/dL — ABNORMAL HIGH (ref 70–99)
Potassium: 3.9 meq/L (ref 3.5–5.1)
Sodium: 138 meq/L (ref 135–145)

## 2012-03-12 LAB — PROTIME-INR
INR: 2.24 — ABNORMAL HIGH (ref 0.00–1.49)
Prothrombin Time: 23.8 s — ABNORMAL HIGH (ref 11.6–15.2)

## 2012-03-12 LAB — GLUCOSE, CAPILLARY: Glucose-Capillary: 129 mg/dL — ABNORMAL HIGH (ref 70–99)

## 2012-03-12 LAB — TYPE AND SCREEN: ABO/RH(D): O POS

## 2012-03-12 LAB — APTT: aPTT: 42 s — ABNORMAL HIGH (ref 24–37)

## 2012-03-12 MED ORDER — HYDROCODONE-ACETAMINOPHEN 5-325 MG PO TABS: 1.0000 | ORAL_TABLET | ORAL | Status: DC | PRN

## 2012-03-12 MED ORDER — ACETAMINOPHEN 325 MG PO TABS
650.0000 mg | ORAL_TABLET | Freq: Four times a day (QID) | ORAL | Status: DC | PRN
  Administered 2012-03-13: 650 mg via ORAL
  Filled 2012-03-12: qty 2

## 2012-03-12 MED ORDER — HYDROMORPHONE HCL PF 1 MG/ML IJ SOLN: 1.0000 mg | INTRAMUSCULAR | Status: DC | PRN

## 2012-03-12 MED ORDER — ONDANSETRON HCL 4 MG PO TABS
4.0000 mg | ORAL_TABLET | Freq: Four times a day (QID) | ORAL | Status: DC | PRN
  Filled 2012-03-12: qty 1

## 2012-03-12 MED ORDER — ONDANSETRON HCL 4 MG/2ML IJ SOLN
4.0000 mg | Freq: Four times a day (QID) | INTRAMUSCULAR | Status: DC | PRN
  Administered 2012-03-15: 4 mg via INTRAVENOUS
  Filled 2012-03-12: qty 2

## 2012-03-12 MED ORDER — SODIUM CHLORIDE 0.9 % IV BOLUS (SEPSIS)
1000.0000 mL | Freq: Once | INTRAVENOUS | Status: AC
  Administered 2012-03-12: 1000 mL via INTRAVENOUS

## 2012-03-12 MED ORDER — PANTOPRAZOLE SODIUM 40 MG PO TBEC
40.0000 mg | DELAYED_RELEASE_TABLET | Freq: Two times a day (BID) | ORAL | Status: DC
  Administered 2012-03-12 – 2012-03-17 (×11): 40 mg via ORAL
  Filled 2012-03-12 (×13): qty 1

## 2012-03-12 MED ORDER — ATORVASTATIN CALCIUM 20 MG PO TABS
20.0000 mg | ORAL_TABLET | ORAL | Status: DC
  Administered 2012-03-12 – 2012-03-16 (×3): 20 mg via ORAL
  Filled 2012-03-12 (×3): qty 1

## 2012-03-12 MED ORDER — INSULIN ASPART 100 UNIT/ML ~~LOC~~ SOLN
0.0000 [IU] | Freq: Every day | SUBCUTANEOUS | Status: DC
  Administered 2012-03-16: 1 [IU] via SUBCUTANEOUS

## 2012-03-12 MED ORDER — INSULIN ASPART 100 UNIT/ML ~~LOC~~ SOLN
0.0000 [IU] | Freq: Three times a day (TID) | SUBCUTANEOUS | Status: DC
  Administered 2012-03-13 (×3): 1 [IU] via SUBCUTANEOUS
  Administered 2012-03-14: 2 [IU] via SUBCUTANEOUS
  Administered 2012-03-15: 1 [IU] via SUBCUTANEOUS
  Administered 2012-03-16: 2 [IU] via SUBCUTANEOUS
  Administered 2012-03-16: 1 [IU] via SUBCUTANEOUS

## 2012-03-12 MED ORDER — SODIUM CHLORIDE 0.9 % IJ SOLN
3.0000 mL | Freq: Two times a day (BID) | INTRAMUSCULAR | Status: DC
  Administered 2012-03-12 – 2012-03-16 (×4): 3 mL via INTRAVENOUS

## 2012-03-12 MED ORDER — SODIUM CHLORIDE 0.9 % IV SOLN
INTRAVENOUS | Status: DC
  Administered 2012-03-12: 75 mL/h via INTRAVENOUS
  Administered 2012-03-13: 09:00:00 via INTRAVENOUS

## 2012-03-12 MED ORDER — IOHEXOL 300 MG/ML  SOLN
80.0000 mL | Freq: Once | INTRAMUSCULAR | Status: AC | PRN
  Administered 2012-03-12: 80 mL via INTRAVENOUS

## 2012-03-12 MED ORDER — ACETAMINOPHEN 650 MG RE SUPP: 650.0000 mg | Freq: Four times a day (QID) | RECTAL | Status: DC | PRN

## 2012-03-12 MED ORDER — METRONIDAZOLE IN NACL 5-0.79 MG/ML-% IV SOLN
500.0000 mg | Freq: Three times a day (TID) | INTRAVENOUS | Status: DC
  Administered 2012-03-12 – 2012-03-16 (×11): 500 mg via INTRAVENOUS
  Filled 2012-03-12 (×13): qty 100

## 2012-03-12 MED ORDER — METOPROLOL TARTRATE 50 MG PO TABS
50.0000 mg | ORAL_TABLET | Freq: Two times a day (BID) | ORAL | Status: DC
  Administered 2012-03-12 – 2012-03-17 (×10): 50 mg via ORAL
  Filled 2012-03-12 (×11): qty 1

## 2012-03-12 MED ORDER — ALUM & MAG HYDROXIDE-SIMETH 200-200-20 MG/5ML PO SUSP
30.0000 mL | Freq: Four times a day (QID) | ORAL | Status: DC | PRN
Start: 2012-03-12 — End: 2012-03-17

## 2012-03-12 MED ORDER — CIPROFLOXACIN IN D5W 400 MG/200ML IV SOLN
400.0000 mg | Freq: Two times a day (BID) | INTRAVENOUS | Status: DC
  Administered 2012-03-12 – 2012-03-15 (×7): 400 mg via INTRAVENOUS
  Filled 2012-03-12 (×10): qty 200

## 2012-03-12 NOTE — Progress Notes (Signed)
Upon assessment, RN found a small amount of blood on the backside of the patient's gown and blood on the patient's bedpad. Patient is not actively bleeding. RN will monitor patient closely for more bleeding.

## 2012-03-12 NOTE — ED Notes (Signed)
Pt reports she started having rectal bleeding last night. Reports weakness. Pt sometimes slow to answer, unsure if this is pts baseline. Hand grips and leg pushes strong bil. Upon rectal temperature 99.2 pt has noticeable dried blood around rectum. Pt reports lower abdominal pain, cannot rate.

## 2012-03-12 NOTE — Progress Notes (Addendum)
CT scan of abdomen reviewed as requested during sign out by Dr. Isidoro Donning, I now see she has already reviewed this and started patient on empiric cipro and flagyl for treatment.  Will check back later this evening to see if C.Diff PCR is back.  If C.Diff PCR is back and positive then I would plan to D/C cipro, continue flagyl, and would then add PO vancomycin for aggressive treatment given the severe symptoms and appearance on CT scan of her colitis.

## 2012-03-12 NOTE — Plan of Care (Signed)
Reviewed CT abd and pelvis consistent with severe colitis. I called in the patient's room to review the results, talked to patient's husband and RN. I will start her on Cipro and flagyl IV. GI will follow in AM. Check C diff PCR.   Khristi Schiller M.D. [facility] 03/12/2012, 7:51 PM  Pager: [phone]

## 2012-03-12 NOTE — H&P (Signed)
History and Physical       Hospital Admission Note Date: 03/12/2012  Patient name: Lisa Hopkins Medical record number: [id number] Date of birth: September 18, 1924 Age: 78 y.o. Gender: female PCP: Nadean Corwin, MD    Chief Complaint:  Rectal bleeding last night with abdominal pain for last several days  HPI: Patient is 78 year old female with history of atrial fibrillation on Coumadin, history of CVA, hypertension, hyperlipidemia, diabetes mellitus, hiatal hernia, GERD presented to ED with a several episodes of rectal bleeding last night. History was obtained from the patient, her husband and son present in the room. Her husband also reported that patient has been complaining of lower abdominal pain for several days, intermittent crampy and worse after eating dinner and at night. She denied any diarrhea, fever, chills, nausea, vomiting or hematemesis. Patient had two episodes of large amount of rectal bleeding. She's not currently actively bleeding. Hemoglobin is 16.9 on the labs checked in the ED with a hematocrit of 37.5, leukocytosis with white count of 13.2, no imaging is available.  Review of Systems:  Constitutional: Denies fever, chills, diaphoresis, and fatigue.  HEENT: Denies photophobia, eye pain, redness, hearing loss, ear pain, congestion, sore throat, rhinorrhea, sneezing, mouth sores, trouble swallowing, neck pain, neck stiffness and tinnitus.   Respiratory: Denies SOB, DOE, cough, chest tightness,  and wheezing.   Cardiovascular: Denies chest pain, palpitations and leg swelling.  Gastrointestinal: Please see history of present illness Genitourinary: Denies dysuria, urgency, frequency, hematuria, flank pain and difficulty urinating.  Musculoskeletal: Denies myalgias, back pain, joint swelling, arthralgias and gait problem.  Skin: Denies pallor, rash and wound.  Neurological: Denies dizziness, seizures, syncope,  weakness, light-headedness, numbness and headaches.  Hematological: Denies adenopathy. Easy bruising, personal or family bleeding history  Psychiatric/Behavioral: Denies suicidal ideation, mood changes, confusion, nervousness, sleep disturbance and agitation  Past Medical History: Past Medical History  Diagnosis Date  . Atrial fibrillation   . Aphasia as late effect of cerebrovascular accident   . CVA (cerebral infarction)   . Hyperlipidemia   . HTN (hypertension)   . Anxiety   . Diabetes mellitus type II   . Hiatal hernia    Past Surgical History  Procedure Date  . Upper gastrointestinal endoscopy 2005  . Colonoscopy 2005  . Cholecystectomy   . Cataract extraction, bilateral   . Tonsillectomy     Medications: Prior to Admission medications   Medication Sig Start Date End Date Taking? Authorizing Provider  acetaminophen (TYLENOL) 325 MG tablet Take 650 mg by mouth every 4 (four) hours as needed.     Yes Historical Provider, MD  alum hydroxide-mag trisilicate (ANTACID) 80-20 MG CHEW Chew 1 tablet by mouth 3 (three) times daily as needed.   Yes Historical Provider, MD  Cholecalciferol (VITAMIN D) 2000 UNITS CAPS Take 2 capsules by mouth daily.    Yes Historical Provider, MD  metoprolol (LOPRESSOR) 50 MG tablet Take 50 mg by mouth 2 (two) times daily.     Yes Historical Provider, MD  rosuvastatin (CRESTOR) 20 MG tablet Take 20 mg by mouth every evening.    Yes Historical Provider, MD  warfarin (COUMADIN) 2 MG tablet Take 1-2 mg by mouth See admin instructions. 2mg  daily except Friday takes 1mg    Yes Historical Provider, MD    Allergies:  No Known Allergies  Social History:  reports that she has never smoked. She has never used smokeless tobacco. She reports that she does not drink alcohol. Her drug history not on file.patient lives at home  with her husband and is functional with ADL's  Family History: History reviewed. Both parents deceased, no history of GI CA in patient's  immediate family  Physical Exam: Blood pressure 173/82, pulse 101, temperature 97.9 F (36.6 C), temperature source Rectal, resp. rate 18, SpO2 98.00%. General: Alert, awake, oriented x3, in no acute distress. HEENT: normocephalic, atraumatic, anicteric sclera, pink conjunctiva, pupils equal and reactive to light and accomodation, oropharynx clear Neck: supple, no masses or lymphadenopathy, no goiter, no bruits  Heart: iregularly iregular, tachy Lungs: Clear to auscultation bilaterally, no wheezing, rales or rhonchi. Abdomen: Soft, bilateral lower quadrant tenderness, nondistended, positive bowel sounds, no masses. Extremities: No clubbing, cyanosis or edema with positive pedal pulses. Neuro: Grossly intact, no focal neurological deficits, strength 5/5 upper and lower extremities bilaterally Psych: alert and oriented x 3, normal mood and affect Skin: no rashes or lesions, warm and dry   LABS on Admission:  Basic Metabolic Panel:  Lab 03/12/12 1610  NA 138  K 3.9  CL 100  CO2 26  GLUCOSE 140*  BUN 14  CREATININE 0.72  CALCIUM 9.8  MG --  PHOS --   CBC:  Lab 03/12/12 1250  WBC 13.2*  NEUTROABS 10.5*  HGB 16.9*  HCT 47.5*  MCV 92.8  PLT 195   CBG:  Lab 03/12/12 1218  GLUCAP 129*     Radiological Exams on Admission: No results found.  Assessment/Plan Principal Problem:  *Lower GI bleed with lower abdominal pain: Differential diagnoses include infectious/inflammatory/ischemic colitis given abdominal pain, rectal bleeding and leukocytosis vs due to anticoagulation. Patient has no prior history of GI bleed. - admit to telemetry, serial H&H, I will obtain stat CT abdomen and pelvis for further workup  - Will continue gentle hydration, place on clear liquid diet, pain control and antiemetics - Discussed with gastroenterology Deboraha Sprang), Dr Evette Cristal, will follow patient in AM, pending imaging currently - Hold coumadin  Active Problems:  DIABETES MELLITUS, TYPE II -  Obtain hemoglobin A1c, place on sliding scale insulin   hyperlipidemia: Continue Lipitor    HYPERTENSION: - Restart metoprolol    Atrial fibrillation:  - Will restart metoprolol for rate control - Hold Coumadin. Since patient is not currently actively bleeding, hemoglobin is stable, there is no need for urgent reversal as she has a history of CVA. INR is therapeutic at 2.24   CVA: Stable - Hold Coumadin for now    HIATAL HERNIA with GERD - Place on PPI   Dehydration: Continue gentle hydration  DVT prophylaxis: SCD's  CODE STATUS: FC  Further plan will depend as patient's clinical course evolves and further radiologic and laboratory data become available.   Time Spent on Admission: 1 hour  Mattias Walmsley M.D. [facility] 03/12/2012, 4:07 PM Pager: [phone]  If 7PM-7AM, please contact night-coverage [email or web address] Password [email or web address]

## 2012-03-12 NOTE — ED Notes (Signed)
Report given to grace, rn

## 2012-03-12 NOTE — ED Provider Notes (Signed)
History     CSN: [id number]  Arrival date & time 03/12/12  1146   First MD Initiated Contact with Patient 03/12/12 1223      Chief Complaint  Patient presents with  . Rectal Bleeding    (Consider location/radiation/quality/duration/timing/severity/associated sxs/prior treatment) HPI Pt with history of atrial fibrillation brought to the ED with husband. Pt reports she has had several episodes of bright red blood per rectum over night, none since this AM. She denies any lightheadedness or dizziness. She has had some lower abdominal pain. No difficulty urinating.   Past Medical History  Diagnosis Date  . Atrial fibrillation   . Aphasia as late effect of cerebrovascular accident   . CVA (cerebral infarction)   . Hyperlipidemia   . HTN (hypertension)   . Anxiety   . Diabetes mellitus type II   . Hiatal hernia     Past Surgical History  Procedure Date  . Upper gastrointestinal endoscopy 2005  . Colonoscopy 2005    History reviewed. No pertinent family history.  History  Substance Use Topics  . Smoking status: Never Smoker   . Smokeless tobacco: Not on file  . Alcohol Use: No    OB History    Grav Para Term Preterm Abortions TAB SAB Ect Mult Living                  Review of Systems All other systems reviewed and are negative except as noted in HPI.   Allergies  Review of patient's allergies indicates no known allergies.  Home Medications   Current Outpatient Rx  Name Route Sig Dispense Refill  . ACETAMINOPHEN 325 MG PO TABS Oral Take 650 mg by mouth every 4 (four) hours as needed.      Marland Kitchen ALUM HYDROXIDE-MAG TRISILICATE 80-20 MG PO CHEW Oral Chew 1 tablet by mouth 3 (three) times daily as needed.    Marland Kitchen VITAMIN D 2000 UNITS PO CAPS Oral Take 2 capsules by mouth daily.     Marland Kitchen METOPROLOL TARTRATE 50 MG PO TABS Oral Take 50 mg by mouth 2 (two) times daily.      Marland Kitchen ROSUVASTATIN CALCIUM 20 MG PO TABS Oral Take 20 mg by mouth every evening.     . WARFARIN SODIUM 2 MG  PO TABS Oral Take 1-2 mg by mouth See admin instructions. 2mg  daily except Friday takes 1mg       BP 179/99  Pulse 73  Temp 99.2 F (37.3 C) (Rectal)  Resp 18  SpO2 96%  Physical Exam  Nursing note and vitals reviewed. Constitutional: She appears well-developed and well-nourished.  HENT:  Head: Normocephalic and atraumatic.  Eyes: EOM are normal. Pupils are equal, round, and reactive to light.  Neck: Normal range of motion. Neck supple.  Cardiovascular: Normal rate, normal heart sounds and intact distal pulses.   Pulmonary/Chest: Effort normal and breath sounds normal.  Abdominal: Bowel sounds are normal. She exhibits no distension and no mass. There is tenderness (lower abdomen tender). There is no rebound and no guarding.  Genitourinary: Guaiac positive stool.  Musculoskeletal: Normal range of motion. She exhibits no edema and no tenderness.  Neurological: She is alert. She has normal strength. No cranial nerve deficit or sensory deficit.  Skin: Skin is warm and dry. No rash noted.  Psychiatric: She has a normal mood and affect.    ED Course  Procedures (including critical care time)  Labs Reviewed  CBC WITH DIFFERENTIAL - Abnormal; Notable for the following:    WBC  13.2 (*)     RBC 5.12 (*)     Hemoglobin 16.9 (*)     HCT 47.5 (*)     Neutrophils Relative 80 (*)     Neutro Abs 10.5 (*)     All other components within normal limits  BASIC METABOLIC PANEL - Abnormal; Notable for the following:    Glucose, Bld 140 (*)     GFR calc non Af Amer 76 (*)     GFR calc Af Amer 88 (*)     All other components within normal limits  PROTIME-INR - Abnormal; Notable for the following:    Prothrombin Time 23.8 (*)     INR 2.24 (*)     All other components within normal limits  APTT - Abnormal; Notable for the following:    aPTT 42 (*)     All other components within normal limits  GLUCOSE, CAPILLARY - Abnormal; Notable for the following:    Glucose-Capillary 129 (*)     All  other components within normal limits  TYPE AND SCREEN  ABO/RH   No results found.   No diagnosis found.    MDM   Date: 03/12/2012  Rate: 88  Rhythm: atrial fibrillation  QRS Axis: normal  Intervals: normal  ST/T Wave abnormalities: nonspecific T wave changes  Conduction Disutrbances:none  Narrative Interpretation:   Old EKG Reviewed: unchanged  2:35 PM Hgb here is high, likely hemo-concentrated. Will continue with hydration and admit for observation. Discussed with Dr. Isidoro Donning who agrees with plan to hold off on reversing coumadin for now due to hemodynamic stability and lack of continued bleeding now.         Vyla Pint B. Bernette Mayers, MD 03/12/12 1436

## 2012-03-12 NOTE — ED Notes (Signed)
Pt alert. Respirations even and unlabored, bilateral symmetrical rise and fall of chest. Skin warm and dry. In no acute distress. Denies needs.   

## 2012-03-13 DIAGNOSIS — I69920 Aphasia following unspecified cerebrovascular disease: Secondary | ICD-10-CM

## 2012-03-13 DIAGNOSIS — F411 Generalized anxiety disorder: Secondary | ICD-10-CM

## 2012-03-13 LAB — BASIC METABOLIC PANEL
BUN: 8 mg/dL (ref 6–23)
Chloride: 102 mEq/L (ref 96–112)
Glucose, Bld: 157 mg/dL — ABNORMAL HIGH (ref 70–99)
Potassium: 3.5 mEq/L (ref 3.5–5.1)

## 2012-03-13 LAB — CBC
HCT: 44.2 % (ref 36.0–46.0)
Hemoglobin: 15.3 g/dL — ABNORMAL HIGH (ref 12.0–15.0)
MCH: 32.1 pg (ref 26.0–34.0)
MCHC: 34.6 g/dL (ref 30.0–36.0)

## 2012-03-13 LAB — URINE CULTURE: Colony Count: >=100000

## 2012-03-13 LAB — GLUCOSE, CAPILLARY
Glucose-Capillary: 113 mg/dL — ABNORMAL HIGH (ref 70–99)
Glucose-Capillary: 125 mg/dL — ABNORMAL HIGH (ref 70–99)
Glucose-Capillary: 134 mg/dL — ABNORMAL HIGH (ref 70–99)

## 2012-03-13 MED ORDER — SODIUM CHLORIDE 0.9 % IV SOLN
INTRAVENOUS | Status: AC
  Administered 2012-03-13: 21:00:00 via INTRAVENOUS

## 2012-03-13 NOTE — Progress Notes (Signed)
4:50 AM update: No results yet on C.Diff PCR.

## 2012-03-13 NOTE — Progress Notes (Signed)
Patient ID: Lisa Hopkins, female   DOB: 21-Jul-1924, 78 y.o.   MRN: [id number]  [facility] PROGRESS NOTE  Lisa Hopkins:[id number] DOB: [birth year] DOA: 03/12/2012 PCP: Nadean Corwin, MD  Brief narrative: Patient is 78 year old female with history of atrial fibrillation on Coumadin, history of CVA, hypertension, hyperlipidemia, diabetes mellitus, hiatal hernia, GERD presented to ED with a several episodes of rectal bleeding night prior to admission. History was obtained from the patient, her husband and son present in the room. Her husband also reported that patient has been complaining of lower abdominal pain for several days, intermittent crampy and worse after eating dinner and at night. She denied any diarrhea, fever, chills, nausea, vomiting or hematemesis.  Principal Problem:  *Lower GI bleed - unclear etiology and possibly related to colitis - C. Diff pending at this time - continue supportive care as well as antibiotics - CBC in AM  Active Problems:  DIABETES MELLITUS, TYPE II - trend CBG's   Other and unspecified hyperlipidemia - continue statin   HYPERTENSION - stable - continue to monitor vitals per floor protocol   Atrial fibrillation - reate controlled  Consultants:  GI  Procedures/Studies: Ct Abdomen Pelvis W Contrast 03/12/2012   IMPRESSION:   1.  Marked wall thickening and mucosal enhancement in the descending and sigmoid colon, compatible with severe colitis. Surrounding stranding and mild ascites.  The celiac trunk and mesenteric arteries appear patent, and no intramural gas is observed in the colon.  This colitis is probably infectious rather than ischemic, and if the patient has been on recent antibiotics then checking for C difficile toxins may be warranted.  I doubt that the appearance is due to bowel wall hematoma.  2.  Large hiatal hernia, chronic.   Antibiotics:  Flagyl 10/15 -->  Cipro 10/15 -->  Code Status:  Full Family Communication: Pt at bedside Disposition Plan: Home when medically stable  HPI/Subjective: No events overnight.   Objective: Filed Vitals:   03/12/12 1700 03/12/12 2045 03/13/12 0447 03/13/12 1331  BP:  153/97 142/71 107/77  Pulse:  75 78 70  Temp:  99.3 F (37.4 C) 98.8 F (37.1 C) 98 F (36.7 C)  TempSrc:  Oral Oral Oral  Resp:  18 16 18   Height: 5\' 4"  (1.626 m)     Weight: 55.4 kg (122 lb 2.2 oz)  55.974 kg (123 lb 6.4 oz)   SpO2:  98% 96% 98%    Intake/Output Summary (Last 24 hours) at 03/13/12 1743 Last data filed at 03/13/12 1600  Gross per 24 hour  Intake   1870 ml  Output   2125 ml  Net   -255 ml    Exam:   General:  Pt is alert, follows commands appropriately, not in acute distress  Cardiovascular: Regular rate and rhythm, S1/S2, no murmurs, no rubs, no gallops  Respiratory: Clear to auscultation bilaterally, no wheezing, no crackles, no rhonchi  Abdomen: Soft, non tender, non distended, bowel sounds present, no guarding  Extremities: No edema, pulses DP and PT palpable bilaterally  Neuro: Grossly nonfocal  Data Reviewed: Basic Metabolic Panel:  Lab 03/13/12 9562 03/12/12 1250  NA 136 138  K 3.5 3.9  CL 102 100  CO2 23 26  GLUCOSE 157* 140*  BUN 8 14  CREATININE 0.80 0.72  CALCIUM 8.6 9.8  MG -- --  PHOS -- --   CBC:  Lab 03/13/12 0835 03/13/12 0100 03/12/12 1710 03/12/12 1250  WBC 13.9* -- -- 13.2*  NEUTROABS -- -- --  10.5*  HGB 15.3* 15.1* 16.9* 16.9*  HCT 44.2 44.3 47.9* 47.5*  MCV 92.9 -- -- 92.8  PLT 200 -- -- 195   CBG:  Lab 03/13/12 1144 03/13/12 0755 03/12/12 2041 03/12/12 1749 03/12/12 1218  GLUCAP 144* 125* 113* 113* 129*    No results found for this or any previous visit (from the past 240 hour(s)).   Scheduled Meds:   . atorvastatin  20 mg Oral QODAY  . ciprofloxacin  400 mg Intravenous Q12H  . insulin aspart  0-5 Units Subcutaneous QHS  . insulin aspart  0-9 Units Subcutaneous TID WC  . metoprolol   50 mg Oral BID  . metronidazole  500 mg Intravenous Q8H  . pantoprazole  40 mg Oral BID AC  . sodium chloride  3 mL Intravenous Q12H   Continuous Infusions:   . sodium chloride 75 mL/hr at 03/13/12 0845     Lisa Presto, MD  [facility] Pager [phone]  If 7PM-7AM, please contact night-coverage [email or web address] Password [email or web address] 03/13/2012, 5:43 PM   LOS: 1 day

## 2012-03-13 NOTE — Consult Note (Signed)
Subjective:   HPI  The patient is an 78 year old female who presented to the emergency room with complaints of rectal bleeding. She had been having some abdominal pain for a few days with associated bloating and cramping and then started to pass blood in the stool. She does have a history of a prior CVA and atrial fibrillation and has been on Coumadin. A CT scan of the abdomen was done which shows wall thickening of the sigmoid and descending colon compatible with colitis. The etiology of this colitis could be infectious versus ischemic. She does not recall being on any antibiotics in the last few months.  Review of Systems Denies chest pain or shortness of breath  Past Medical History  Diagnosis Date  . Atrial fibrillation   . Aphasia as late effect of cerebrovascular accident   . CVA (cerebral infarction)   . Hyperlipidemia   . HTN (hypertension)   . Anxiety   . Diabetes mellitus type II   . Hiatal hernia    Past Surgical History  Procedure Date  . Upper gastrointestinal endoscopy 2005  . Colonoscopy 2005  . Cholecystectomy   . Cataract extraction, bilateral   . Tonsillectomy    History   Social History  . Marital Status: Married    Spouse Name: N/A    Number of Children: N/A  . Years of Education: N/A   Occupational History  . Not on file.   Social History Main Topics  . Smoking status: Never Smoker   . Smokeless tobacco: Never Used  . Alcohol Use: No  . Drug Use: Not on file  . Sexually Active: No   Other Topics Concern  . Not on file   Social History Narrative  . No narrative on file   family history is not on file. Current facility-administered medications:0.9 %  sodium chloride infusion, , Intravenous, Continuous, Ripudeep K Rai, MD, Last Rate: 75 mL/hr at 03/13/12 0845;  acetaminophen (TYLENOL) suppository 650 mg, 650 mg, Rectal, Q6H PRN, Ripudeep K Rai, MD;  acetaminophen (TYLENOL) tablet 650 mg, 650 mg, Oral, Q6H PRN, Ripudeep K Rai, MD alum & mag  hydroxide-simeth (MAALOX/MYLANTA) 200-200-20 MG/5ML suspension 30 mL, 30 mL, Oral, Q6H PRN, Ripudeep K Rai, MD;  atorvastatin (LIPITOR) tablet 20 mg, 20 mg, Oral, QODAY, Ripudeep K Rai, MD, 20 mg at 03/12/12 1753;  ciprofloxacin (CIPRO) IVPB 400 mg, 400 mg, Intravenous, Q12H, Ripudeep K Rai, MD, 400 mg at 03/12/12 2143;  HYDROcodone-acetaminophen (NORCO/VICODIN) 5-325 MG per tablet 1-2 tablet, 1-2 tablet, Oral, Q4H PRN, Ripudeep K Rai, MD HYDROmorphone (DILAUDID) injection 1 mg, 1 mg, Intravenous, Q4H PRN, Ripudeep K Rai, MD;  insulin aspart (novoLOG) injection 0-5 Units, 0-5 Units, Subcutaneous, QHS, Ripudeep K Rai, MD;  insulin aspart (novoLOG) injection 0-9 Units, 0-9 Units, Subcutaneous, TID WC, Ripudeep K Rai, MD, 1 Units at 03/13/12 0845;  iohexol (OMNIPAQUE) 300 MG/ML solution 80 mL, 80 mL, Intravenous, Once PRN, Medication Radiologist, MD, 80 mL at 03/12/12 1852 metoprolol (LOPRESSOR) tablet 50 mg, 50 mg, Oral, BID, Ripudeep K Rai, MD, 50 mg at 03/12/12 1752;  metroNIDAZOLE (FLAGYL) IVPB 500 mg, 500 mg, Intravenous, Q8H, Ripudeep K Rai, MD, 500 mg at 03/13/12 0530;  ondansetron (ZOFRAN) injection 4 mg, 4 mg, Intravenous, Q6H PRN, Ripudeep K Rai, MD;  ondansetron (ZOFRAN) tablet 4 mg, 4 mg, Oral, Q6H PRN, Ripudeep K Rai, MD pantoprazole (PROTONIX) EC tablet 40 mg, 40 mg, Oral, BID AC, Ripudeep K Rai, MD, 40 mg at 03/13/12 0845;  sodium chloride 0.9 %  bolus 1,000 mL, 1,000 mL, Intravenous, Once, Charles B. Bernette Mayers, MD, 1,000 mL at 03/12/12 1250;  sodium chloride 0.9 % injection 3 mL, 3 mL, Intravenous, Q12H, Ripudeep K Rai, MD, 3 mL at 03/12/12 2144 No Known Allergies   Objective:     BP 142/71  Pulse 78  Temp 98.8 F (37.1 C) (Oral)  Resp 16  Ht 5\' 4"  (1.626 m)  Wt 55.974 kg (123 lb 6.4 oz)  BMI 21.18 kg/m2  SpO2 96%  She is in no acute distress  Heart irregular rhythm  Lungs clear  Abdomen: Bowel sounds are present, soft, mild tenderness in the lower abdomen without rebound or  guarding  Laboratory No components found with this basename: d1      Assessment:     Rectal bleeding secondary to colitis in the sigmoid and descending colon. This could be infectious versus ischemic.      Plan:     Supportive care. Check Clostridium difficile PCR test. Empiric Cipro and Flagyl until we know the results of the Clostridium difficile PCR test. Bowel rest for now.    Component Value Date/Time   WBC 13.9* 03/13/2012 0835   HGB 15.3* 03/13/2012 0835   HCT 44.2 03/13/2012 0835   PLT 200 03/13/2012 0835   ALT 21 02/28/2010 0549   AST 26 02/28/2010 0549   NA 136 03/13/2012 0835   K 3.5 03/13/2012 0835   CL 102 03/13/2012 0835   CREATININE 0.80 03/13/2012 0835   BUN 8 03/13/2012 0835   CO2 23 03/13/2012 0835   CALCIUM 8.6 03/13/2012 0835   PHOS 3.2 02/21/2010 2024   ALKPHOS 64 02/28/2010 0549

## 2012-03-13 NOTE — Care Management Note (Signed)
    Page 1 of 1   03/17/2012     11:55:35 AM   CARE MANAGEMENT NOTE 03/17/2012  Patient:  Lisa Hopkins, Lisa Hopkins   Account Number:  [id number]  Date Initiated:  03/13/2012  Documentation initiated by:  Lanier Clam  Subjective/Objective Assessment:   ADMITTED W/RECTAL BLEED.COLITIS.     Action/Plan:   FROM HOME W/SPOUSE.INDEP PTA.   Anticipated DC Date:  03/14/2012   Anticipated DC Plan:  HOME/SELF CARE      DC Planning Services  CM consult      Choice offered to / List presented to:             Status of service:  Completed, signed off Medicare Important Message given?   (If response is "NO", the following Medicare IM given date fields will be blank) Date Medicare IM given:   Date Additional Medicare IM given:    Discharge Disposition:    Per UR Regulation:  Reviewed for med. necessity/level of care/duration of stay  If discussed at Long Length of Stay Meetings, dates discussed:    Comments:  03/17/2012 Raynelle Bring BSN CCM [phone] Pt for discharge today/no [facility] needs or orders noted   03/13/12 KATHY MAHABIR RN,BSN NCM [phone] GI FOLLOWING-?INFECTIOUS VS ISCHEMIC.

## 2012-03-14 LAB — GLUCOSE, CAPILLARY
Glucose-Capillary: 118 mg/dL — ABNORMAL HIGH (ref 70–99)
Glucose-Capillary: 151 mg/dL — ABNORMAL HIGH (ref 70–99)
Glucose-Capillary: 151 mg/dL — ABNORMAL HIGH (ref 70–99)

## 2012-03-14 LAB — BASIC METABOLIC PANEL
BUN: 6 mg/dL (ref 6–23)
CO2: 26 mEq/L (ref 19–32)
Calcium: 8.5 mg/dL (ref 8.4–10.5)
Chloride: 107 mEq/L (ref 96–112)
Creatinine, Ser: 0.83 mg/dL (ref 0.50–1.10)
Glucose, Bld: 115 mg/dL — ABNORMAL HIGH (ref 70–99)

## 2012-03-14 LAB — CBC
HCT: 39.6 % (ref 36.0–46.0)
MCH: 31.7 pg (ref 26.0–34.0)
MCV: 93 fL (ref 78.0–100.0)
Platelets: 188 10*3/uL (ref 150–400)
RBC: 4.26 MIL/uL (ref 3.87–5.11)
WBC: 11.9 10*3/uL — ABNORMAL HIGH (ref 4.0–10.5)

## 2012-03-14 MED ORDER — ZOLPIDEM TARTRATE 5 MG PO TABS
5.0000 mg | ORAL_TABLET | Freq: Every evening | ORAL | Status: DC | PRN
  Administered 2012-03-14: 5 mg via ORAL
  Filled 2012-03-14: qty 1

## 2012-03-14 NOTE — Progress Notes (Signed)
[facility] Progress Note  Subjective: Some generalized abdominal discomfort  Objective: Vital signs in last 24 hours: Temp:  [97.8 F (36.6 C)-98 F (36.7 C)] 97.8 F (36.6 C) (10/17 0438) Pulse Rate:  [59-78] 78  (10/17 1022) Resp:  [18] 18  (10/17 0438) BP: (107-148)/(59-87) 134/59 mmHg (10/17 1022) SpO2:  [95 %-98 %] 95 % (10/17 0438) Weight change:    PE: No distress Abdomen: bowel sounds present, soft, mild discomfort Lab Results: Results for orders placed during the hospital encounter of 03/12/12 (from the past 24 hour(s))  GLUCOSE, CAPILLARY     Status: Abnormal   Collection Time   03/13/12 11:44 AM      Component Value Range   Glucose-Capillary 144 (*) 70 - 99 mg/dL   Comment 1 Notify RN    GLUCOSE, CAPILLARY     Status: Abnormal   Collection Time   03/13/12  4:36 PM      Component Value Range   Glucose-Capillary 134 (*) 70 - 99 mg/dL   Comment 1 Notify RN    GLUCOSE, CAPILLARY     Status: Abnormal   Collection Time   03/13/12  9:01 PM      Component Value Range   Glucose-Capillary 122 (*) 70 - 99 mg/dL  CBC     Status: Abnormal   Collection Time   03/14/12  4:12 AM      Component Value Range   WBC 11.9 (*) 4.0 - 10.5 K/uL   RBC 4.26  3.87 - 5.11 MIL/uL   Hemoglobin 13.5  12.0 - 15.0 g/dL   HCT 95.6  21.3 - 08.6 %   MCV 93.0  78.0 - 100.0 fL   MCH 31.7  26.0 - 34.0 pg   MCHC 34.1  30.0 - 36.0 g/dL   RDW 57.8  46.9 - 62.9 %   Platelets 188  150 - 400 K/uL  BASIC METABOLIC PANEL     Status: Abnormal   Collection Time   03/14/12  4:12 AM      Component Value Range   Sodium 139  135 - 145 mEq/L   Potassium 3.6  3.5 - 5.1 mEq/L   Chloride 107  96 - 112 mEq/L   CO2 26  19 - 32 mEq/L   Glucose, Bld 115 (*) 70 - 99 mg/dL   BUN 6  6 - 23 mg/dL   Creatinine, Ser 5.28  0.50 - 1.10 mg/dL   Calcium 8.5  8.4 - 41.3 mg/dL   GFR calc non Af Amer 63 (*) >90 mL/min   GFR calc Af Amer 72 (*) >90 mL/min  GLUCOSE, CAPILLARY     Status: Abnormal   Collection Time   03/14/12  8:16 AM      Component Value Range   Glucose-Capillary 118 (*) 70 - 99 mg/dL  GLUCOSE, CAPILLARY     Status: Abnormal   Collection Time   03/14/12  8:29 AM      Component Value Range   Glucose-Capillary 161 (*) 70 - 99 mg/dL   Comment 1 Notify RN      Studies/Results: @RISRSLT24 @    Assessment: Colitis, ischemic vs infectious  Plan: Continue antibiotics, and supportive care, follow clinically.    GANEM,SALEM F 03/14/2012, 11:06 AM  Lab Results  Component Value Date   HGB 13.5 03/14/2012   HGB 15.3* 03/13/2012   HGB 15.1* 03/13/2012   HCT 39.6 03/14/2012   HCT 44.2 03/13/2012   HCT 44.3 03/13/2012   ALKPHOS 64 02/28/2010  ALKPHOS 84 02/21/2010   AST 26 02/28/2010   AST 27 02/21/2010   ALT 21 02/28/2010   ALT 23 02/21/2010

## 2012-03-14 NOTE — Progress Notes (Signed)
Patient ID: Lisa Hopkins, female   DOB: 01-Dec-1924, 78 y.o.   MRN: [id number]  [facility] PROGRESS NOTE  GENEVIVE FARES JYN:[id number] DOB: 02/23/1925 DOA: 03/12/2012 PCP: Nadean Corwin, MD  Brief narrative:  Patient is 78 year old female with history of atrial fibrillation on Coumadin, history of CVA, hypertension, hyperlipidemia, diabetes mellitus, hiatal hernia, GERD presented to ED with a several episodes of rectal bleeding night prior to admission. History was obtained from the patient, her husband and son present in the room. Her husband also reported that patient has been complaining of lower abdominal pain for several days, intermittent crampy and worse after eating dinner and at night. She denied any diarrhea, fever, chills, nausea, vomiting or hematemesis.   Principal Problem:  *Lower GI bleed  - related to colitis  - C. Diff pending at this time but current antibiotic therapy adequate in coverage - continue supportive care as well as antibiotics  - CBC in AM  Active Problems:  DIABETES MELLITUS, TYPE II  - trend CBG's  Other and unspecified hyperlipidemia  - continue statin  HYPERTENSION  - stable  - continue to monitor vitals per floor protocol  Atrial fibrillation  - reate controlled   Consultants:  GI  Procedures/Studies:  Ct Abdomen Pelvis W Contrast  03/12/2012  IMPRESSION:  1. Marked wall thickening and mucosal enhancement in the descending and sigmoid colon, compatible with severe colitis. Surrounding stranding and mild ascites. The celiac trunk and mesenteric arteries appear patent, and no intramural gas is observed in the colon. This colitis is probably infectious rather than ischemic, and if the patient has been on recent antibiotics then checking for C difficile toxins may be warranted. I doubt that the appearance is due to bowel wall hematoma.  2. Large hiatal hernia, chronic.   Antibiotics:  Flagyl 10/15 -->  Cipro 10/15  -->  Code Status: Full  Family Communication: Pt at bedside  Disposition Plan: Home when medically stable   HPI/Subjective: No events overnight.   Objective: Filed Vitals:   03/13/12 2103 03/14/12 0438 03/14/12 1022 03/14/12 1434  BP: 148/87 134/59 134/59 150/60  Pulse: 68 59 78 62  Temp: 97.9 F (36.6 C) 97.8 F (36.6 C)  97.6 F (36.4 C)  TempSrc: Oral Oral  Oral  Resp: 18 18  16   Height:      Weight:      SpO2: 95% 95%  97%    Intake/Output Summary (Last 24 hours) at 03/14/12 1544 Last data filed at 03/14/12 1321  Gross per 24 hour  Intake   2280 ml  Output   2125 ml  Net    155 ml    Exam:   General:  Pt is alert, follows commands appropriately, not in acute distress  Cardiovascular: Regular rate and rhythm, S1/S2, no murmurs, no rubs, no gallops  Respiratory: Clear to auscultation bilaterally, no wheezing, no crackles, no rhonchi  Abdomen: Soft, non tender, non distended, bowel sounds present, no guarding  Extremities: No edema, pulses DP and PT palpable bilaterally  Neuro: Grossly nonfocal  Data Reviewed: Basic Metabolic Panel:  Lab 03/14/12 8657 03/13/12 0835 03/12/12 1250  NA 139 136 138  K 3.6 3.5 3.9  CL 107 102 100  CO2 26 23 26   GLUCOSE 115* 157* 140*  BUN 6 8 14   CREATININE 0.83 0.80 0.72  CALCIUM 8.5 8.6 9.8  MG -- -- --  PHOS -- -- --   Liver Function Tests: No results found for this basename: AST:5,ALT:5,ALKPHOS:5,BILITOT:5,PROT:5,ALBUMIN:5 in  the last 168 hours No results found for this basename: LIPASE:5,AMYLASE:5 in the last 168 hours No results found for this basename: AMMONIA:5 in the last 168 hours CBC:  Lab 03/14/12 0412 03/13/12 0835 03/13/12 0100 03/12/12 1710 03/12/12 1250  WBC 11.9* 13.9* -- -- 13.2*  NEUTROABS -- -- -- -- 10.5*  HGB 13.5 15.3* 15.1* 16.9* 16.9*  HCT 39.6 44.2 44.3 47.9* 47.5*  MCV 93.0 92.9 -- -- 92.8  PLT 188 200 -- -- 195   Cardiac Enzymes: No results found for this basename:  CKTOTAL:5,CKMB:5,CKMBINDEX:5,TROPONINI:5 in the last 168 hours BNP: No components found with this basename: POCBNP:5 CBG:  Lab 03/14/12 1150 03/14/12 0829 03/14/12 0816 03/13/12 2101 03/13/12 1636  GLUCAP 151* 161* 118* 122* 134*    Recent Results (from the past 240 hour(s))  URINE CULTURE     Status: Normal   Collection Time   03/12/12  7:03 PM      Component Value Range Status Comment   Specimen Description URINE, CLEAN CATCH   Final    Special Requests NONE   Final    Culture  Setup Time 03/13/2012 01:48   Final    Colony Count >=100,000 COLONIES/ML   Final    Culture     Final    Value: Multiple bacterial morphotypes present, none predominant. Suggest appropriate recollection if clinically indicated.   Report Status 03/13/2012 FINAL   Final      Scheduled Meds:   . atorvastatin  20 mg Oral QODAY  . ciprofloxacin  400 mg Intravenous Q12H  . insulin aspart  0-5 Units Subcutaneous QHS  . insulin aspart  0-9 Units Subcutaneous TID WC  . metoprolol  50 mg Oral BID  . metronidazole  500 mg Intravenous Q8H  . pantoprazole  40 mg Oral BID AC  . sodium chloride  3 mL Intravenous Q12H   Continuous Infusions:   . sodium chloride Stopped (03/14/12 0416)  . DISCONTD: sodium chloride 75 mL/hr at 03/13/12 0845     Debbora Presto, MD  [facility] Pager [phone]  If 7PM-7AM, please contact night-coverage [email or web address] Password [email or web address] 03/14/2012, 3:44 PM   LOS: 2 days

## 2012-03-14 NOTE — Evaluation (Signed)
Physical Therapy Evaluation Patient Details Name: Lisa Hopkins MRN: [id number] DOB: 1924-06-26 Today's Date: 03/14/2012 Time: 0943-1000 PT Time Calculation (min): 17 min  PT Assessment / Plan / Recommendation Clinical Impression  Pt. was admitted 10/15 with rectal bleed from colitis. Pt. has a h/o stroke with mild expressive deficits. Pt. reports being independent PTA with no AD. Pt. will benefit from PT while in hospital. Spouse not present to discuss DC plans. Pt may benefit fromHHPT at DC unless  spouse unable to provide 24/7 supervision. rec. OT consuklt in hospital.    PT Assessment  Patient needs continued PT services    Follow Up Recommendations  Home health PT    Does the patient have the potential to tolerate intense rehabilitation      Barriers to Discharge        Equipment Recommendations  Rolling walker with 5" wheels    Recommendations for Other Services OT consult   Frequency Min 3X/week    Precautions / Restrictions Precautions Precautions: Fall Precaution Comments: contact orange   Pertinent Vitals/Pain No c/o      Mobility  Transfers Transfers: Sit to Stand;Stand to Sit Sit to Stand: 4: Min guard;From chair/3-in-1;Without upper extremity assist Stand to Sit: 4: Min guard;To chair/3-in-1;With upper extremity assist;With armrests Ambulation/Gait Ambulation/Gait Assistance: 4: Min assist Ambulation Distance (Feet): 50 Feet Assistive device: 1 person hand held assist Ambulation/Gait Assistance Details: pt required steady assist, pt held onto bed, leaned onto window seat to look outmof window  Gait Pattern: Step-through pattern;Decreased stride length Gait velocity: decreased General Gait Details: at times pt would laterally shift to attempt to regain balance.    Shoulder Instructions     Exercises     PT Diagnosis: Difficulty walking;Generalized weakness  PT Problem List: Decreased strength;Decreased activity tolerance;Decreased  balance;Decreased mobility;Decreased safety awareness;Decreased knowledge of use of DME PT Treatment Interventions: DME instruction;Gait training;Stair training;Functional mobility training;Therapeutic activities;Patient/family education   PT Goals Acute Rehab PT Goals PT Goal Formulation: With patient Time For Goal Achievement: 03/28/12 Potential to Achieve Goals: Good Pt will go Supine/Side to Sit: Independently PT Goal: Supine/Side to Sit - Progress: Goal set today Pt will go Sit to Supine/Side: Independently PT Goal: Sit to Supine/Side - Progress: Goal set today Pt will go Sit to Stand: with supervision PT Goal: Sit to Stand - Progress: Goal set today Pt will go Stand to Sit: with supervision PT Goal: Stand to Sit - Progress: Goal set today Pt will Ambulate: >150 feet;with supervision;with least restrictive assistive device PT Goal: Ambulate - Progress: Goal set today Pt will Go Up / Down Stairs: 3-5 stairs;with supervision;with rail(s) PT Goal: Up/Down Stairs - Progress: Goal set today  Visit Information  Last PT Received On: 03/14/12 Assistance Needed: +1    Subjective Data  Subjective: that made me tired. Patient Stated Goal: i want to go home.   Prior Functioning  Home Living Lives With: Spouse Available Help at Discharge: Family Type of Home: House Home Access: Stairs to enter Secretary/administrator of Steps: 4-6 Entrance Stairs-Rails: Right Home Layout: One level Firefighter: Standard Home Adaptive Equipment: None Prior Function Level of Independence: Independent Able to Take Stairs?: Yes Driving: Yes Vocation: Retired Musician: Expressive difficulties (some wordfinding difficulty)    Cognition  Overall Cognitive Status: Impaired Arousal/Alertness: Awake/alert Orientation Level: Appears intact for tasks assessed Behavior During Session: [facility] for tasks performed Cognition - Other Comments: slow reponses to questions      Extremity/Trunk Assessment Right Upper Extremity Assessment RUE  ROM/Strength/Tone: [facility] for tasks assessed RUE Sensation: WFL - Light Touch Left Upper Extremity Assessment LUE ROM/Strength/Tone: WFL for tasks assessed LUE Sensation: WFL - Light Touch Right Lower Extremity Assessment RLE ROM/Strength/Tone: WFL for tasks assessed RLE Sensation: WFL - Light Touch Left Lower Extremity Assessment LLE ROM/Strength/Tone: WFL for tasks assessed LLE Sensation: WFL - Light Touch   Balance Static Sitting Balance Static Sitting - Balance Support: Feet unsupported;No upper extremity supported Static Sitting - Level of Assistance: 6: Modified independent (Device/Increase time) Static Standing Balance Static Standing - Balance Support: No upper extremity supported Static Standing - Level of Assistance: 4: Min assist Static Standing - Comment/# of Minutes: pt wavers while standing , gentle steady assist fromPT, noted more loss with turns  End of Session PT - End of Session Activity Tolerance: Patient limited by fatigue Patient left: in chair;with call bell/phone within reach;with chair alarm set Nurse Communication: Mobility status  GP     Rada Hay 03/14/2012, 10:21 AM  [phone]

## 2012-03-15 LAB — CBC
HCT: 40.3 % (ref 36.0–46.0)
Hemoglobin: 13.8 g/dL (ref 12.0–15.0)
MCH: 31.8 pg (ref 26.0–34.0)
MCHC: 34.2 g/dL (ref 30.0–36.0)
MCV: 92.9 fL (ref 78.0–100.0)
Platelets: 193 K/uL (ref 150–400)
RBC: 4.34 MIL/uL (ref 3.87–5.11)
RDW: 12.8 % (ref 11.5–15.5)
WBC: 9.8 K/uL (ref 4.0–10.5)

## 2012-03-15 LAB — GLUCOSE, CAPILLARY
Glucose-Capillary: 112 mg/dL — ABNORMAL HIGH (ref 70–99)
Glucose-Capillary: 150 mg/dL — ABNORMAL HIGH (ref 70–99)
Glucose-Capillary: 105 mg/dL — ABNORMAL HIGH (ref 70–99)
Glucose-Capillary: 120 mg/dL — ABNORMAL HIGH (ref 70–99)

## 2012-03-15 LAB — BASIC METABOLIC PANEL WITH GFR
BUN: 5 mg/dL — ABNORMAL LOW (ref 6–23)
CO2: 26 meq/L (ref 19–32)
Calcium: 8.8 mg/dL (ref 8.4–10.5)
Chloride: 105 meq/L (ref 96–112)
Creatinine, Ser: 0.79 mg/dL (ref 0.50–1.10)
GFR calc Af Amer: 86 mL/min — ABNORMAL LOW
GFR calc non Af Amer: 74 mL/min — ABNORMAL LOW
Glucose, Bld: 112 mg/dL — ABNORMAL HIGH (ref 70–99)
Potassium: 3.4 meq/L — ABNORMAL LOW (ref 3.5–5.1)
Sodium: 139 meq/L (ref 135–145)

## 2012-03-15 MED ORDER — HYDRALAZINE HCL 20 MG/ML IJ SOLN
5.0000 mg | Freq: Four times a day (QID) | INTRAMUSCULAR | Status: DC | PRN
  Administered 2012-03-15: 5 mg via INTRAVENOUS
  Filled 2012-03-15: qty 1

## 2012-03-15 MED ORDER — POTASSIUM CHLORIDE CRYS ER 20 MEQ PO TBCR
40.0000 meq | EXTENDED_RELEASE_TABLET | Freq: Once | ORAL | Status: AC
  Administered 2012-03-15: 40 meq via ORAL
  Filled 2012-03-15: qty 2

## 2012-03-15 NOTE — Progress Notes (Signed)
[facility] Progress Note  Subjective: Feels better. Tolerating solid food.  Objective: Vital signs in last 24 hours: Temp:  [97.3 F (36.3 C)-98.8 F (37.1 C)] 98.8 F (37.1 C) (10/18 0509) Pulse Rate:  [60-78] 60  (10/18 0509) Resp:  [16] 16  (10/18 0509) BP: (120-161)/(59-90) 135/66 mmHg (10/18 0509) SpO2:  [95 %-97 %] 95 % (10/18 0509) Weight change:    PE: Abdomen with bowel sounds, soft Lab Results: Results for orders placed during the hospital encounter of 03/12/12 (from the past 24 hour(s))  GLUCOSE, CAPILLARY     Status: Abnormal   Collection Time   03/14/12 11:50 AM      Component Value Range   Glucose-Capillary 151 (*) 70 - 99 mg/dL  GLUCOSE, CAPILLARY     Status: Normal   Collection Time   03/14/12  3:57 PM      Component Value Range   Glucose-Capillary 74  70 - 99 mg/dL  GLUCOSE, CAPILLARY     Status: Abnormal   Collection Time   03/14/12  9:40 PM      Component Value Range   Glucose-Capillary 151 (*) 70 - 99 mg/dL  CBC     Status: Normal   Collection Time   03/15/12  4:15 AM      Component Value Range   WBC 9.8  4.0 - 10.5 K/uL   RBC 4.34  3.87 - 5.11 MIL/uL   Hemoglobin 13.8  12.0 - 15.0 g/dL   HCT 16.1  09.6 - 04.5 %   MCV 92.9  78.0 - 100.0 fL   MCH 31.8  26.0 - 34.0 pg   MCHC 34.2  30.0 - 36.0 g/dL   RDW 40.9  81.1 - 91.4 %   Platelets 193  150 - 400 K/uL  BASIC METABOLIC PANEL     Status: Abnormal   Collection Time   03/15/12  4:15 AM      Component Value Range   Sodium 139  135 - 145 mEq/L   Potassium 3.4 (*) 3.5 - 5.1 mEq/L   Chloride 105  96 - 112 mEq/L   CO2 26  19 - 32 mEq/L   Glucose, Bld 112 (*) 70 - 99 mg/dL   BUN 5 (*) 6 - 23 mg/dL   Creatinine, Ser 7.82  0.50 - 1.10 mg/dL   Calcium 8.8  8.4 - 95.6 mg/dL   GFR calc non Af Amer 74 (*) >90 mL/min   GFR calc Af Amer 86 (*) >90 mL/min  GLUCOSE, CAPILLARY     Status: Abnormal   Collection Time   03/15/12  7:37 AM      Component Value Range   Glucose-Capillary 112  (*) 70 - 99 mg/dL    Studies/Results: @RISRSLT24 @    Assessment: Colitis. Ischemic vs infectious. Clinically improving  Plan: If she tolerates diet and continues to improve, she can probably go home tomorrow.    GANEM,SALEM F 03/15/2012, 9:10 AM

## 2012-03-15 NOTE — Progress Notes (Signed)
Physical Therapy Treatment Patient Details Name: Lisa Hopkins MRN: [id number] DOB: 1925/05/28 Today's Date: 03/15/2012 Time: 7829-5621 PT Time Calculation (min): 16 min  PT Assessment / Plan / Recommendation Comments on Treatment Session  Pt ambulated in hallway requiring min assist at times for slight unsteadiness.  Pt reports feeling tired and weak and too fatigued to perform exercises so assisted back to bed.    Follow Up Recommendations  Home health PT     Does the patient have the potential to tolerate intense rehabilitation     Barriers to Discharge        Equipment Recommendations  Rolling walker with 5" wheels    Recommendations for Other Services    Frequency     Plan Discharge plan remains appropriate;Frequency remains appropriate    Precautions / Restrictions Precautions Precautions: Fall   Pertinent Vitals/Pain Pt reports no pain at rest.    Mobility  Bed Mobility Bed Mobility: Supine to Sit;Sit to Supine Supine to Sit: 4: Min assist;HOB elevated Sit to Supine: 5: Supervision Details for Bed Mobility Assistance: min for trunk  Transfers Transfers: Sit to Stand;Stand to Sit Sit to Stand: 4: Min guard;From bed;With upper extremity assist Stand to Sit: 4: Min guard;To bed;With upper extremity assist Details for Transfer Assistance: verbal cues for safe technique, also cued to bring RW when backing up to bed Ambulation/Gait Ambulation/Gait Assistance: 4: Min assist Ambulation Distance (Feet): 400 Feet Assistive device: Rolling walker Ambulation/Gait Assistance Details: min assist x3 for slight LOB, otherwise min/guard, verbal cues for use of RW Gait Pattern: Step-through pattern;Decreased stride length Gait velocity: decreased    Exercises     PT Diagnosis:    PT Problem List:   PT Treatment Interventions:     PT Goals Acute Rehab PT Goals PT Goal: Supine/Side to Sit - Progress: Progressing toward goal PT Goal: Sit to Supine/Side - Progress:  Progressing toward goal PT Goal: Sit to Stand - Progress: Progressing toward goal PT Goal: Stand to Sit - Progress: Progressing toward goal PT Goal: Ambulate - Progress: Progressing toward goal  Visit Information  Last PT Received On: 03/15/12 Assistance Needed: +1    Subjective Data  Subjective: I'm worn out.   Cognition  Overall Cognitive Status: Appears within functional limits for tasks assessed/performed    Balance     End of Session PT - End of Session Activity Tolerance: Patient limited by fatigue Patient left: in bed;with call bell/phone within reach;Other (comment) (RN to check IV)   GP     Dariella Gillihan,KATHrine E 03/15/2012, 3:11 PM Pager: [phone]

## 2012-03-15 NOTE — Progress Notes (Signed)
Pt declined PT this am due to "not feeling well" and c/o nausea.  NT in room assisting pt back to bed. Felecia Shelling  PTA [facility]  Acute  Rehab Pager     [phone]

## 2012-03-15 NOTE — Progress Notes (Signed)
Patient ID: Lisa Hopkins, female   DOB: [birth year], 78 y.o.   MRN: [id number]  [facility] PROGRESS NOTE  RAZAN SILER ONG:[id number] DOB: 12-Feb-1925 DOA: 03/12/2012 PCP: Nadean Corwin, MD  Brief narrative:  Patient is 78 year old female with history of atrial fibrillation on Coumadin, history of CVA, hypertension, hyperlipidemia, diabetes mellitus, hiatal hernia, GERD presented to ED with a several episodes of rectal bleeding night prior to admission. History was obtained from the patient, her husband and son present in the room. Her husband also reported that patient has been complaining of lower abdominal pain for several days, intermittent crampy and worse after eating dinner and at night. She denied any diarrhea, fever, chills, nausea, vomiting or hematemesis.   Principal Problem:  *Lower GI bleed  - related to colitis  - continue supportive care as well as antibiotics  - CBC in AM  Active Problems:  DIABETES MELLITUS, TYPE II  - trend CBG's  Other and unspecified hyperlipidemia  - continue statin  HYPERTENSION  - stable  - continue to monitor vitals per floor protocol  Atrial fibrillation  - reate controlled   Consultants:  GI  Procedures/Studies:  Ct Abdomen Pelvis W Contrast  03/12/2012  IMPRESSION:  1. Marked wall thickening and mucosal enhancement in the descending and sigmoid colon, compatible with severe colitis. Surrounding stranding and mild ascites. The celiac trunk and mesenteric arteries appear patent, and no intramural gas is observed in the colon. This colitis is probably infectious rather than ischemic, and if the patient has been on recent antibiotics then checking for C difficile toxins may be warranted. I doubt that the appearance is due to bowel wall hematoma.  2. Large hiatal hernia, chronic.   Antibiotics:  Flagyl 10/15 -->  Cipro 10/15 -->  Code Status: Full  Family Communication: Pt at bedside  Disposition Plan: Home likely  in AM  HPI/Subjective: No events overnight.   Objective: Filed Vitals:   03/14/12 2120 03/14/12 2355 03/15/12 0053 03/15/12 0509  BP: 154/87 161/90 120/66 135/66  Pulse: 76   60  Temp: 97.3 F (36.3 C)   98.8 F (37.1 C)  TempSrc: Oral   Oral  Resp: 16   16  Height:      Weight:      SpO2: 97%   95%    Intake/Output Summary (Last 24 hours) at 03/15/12 1152 Last data filed at 03/15/12 0947  Gross per 24 hour  Intake   1380 ml  Output   1775 ml  Net   -395 ml    Exam:   General:  Pt is alert, follows commands appropriately, not in acute distress  Cardiovascular: Regular rate and rhythm, S1/S2, no murmurs, no rubs, no gallops  Respiratory: Clear to auscultation bilaterally, no wheezing, no crackles, no rhonchi  Abdomen: Soft, non tender, non distended, bowel sounds present, no guarding  Extremities: No edema, pulses DP and PT palpable bilaterally  Neuro: Grossly nonfocal  Data Reviewed: Basic Metabolic Panel:  Lab 03/15/12 4401 03/14/12 0412 03/13/12 0835 03/12/12 1250  NA 139 139 136 138  K 3.4* 3.6 3.5 3.9  CL 105 107 102 100  CO2 26 26 23 26   GLUCOSE 112* 115* 157* 140*  BUN 5* 6 8 14   CREATININE 0.79 0.83 0.80 0.72  CALCIUM 8.8 8.5 8.6 9.8  MG -- -- -- --  PHOS -- -- -- --   Liver Function Tests: No results found for this basename: AST:5,ALT:5,ALKPHOS:5,BILITOT:5,PROT:5,ALBUMIN:5 in the last 168 hours No results found  for this basename: LIPASE:5,AMYLASE:5 in the last 168 hours No results found for this basename: AMMONIA:5 in the last 168 hours CBC:  Lab 03/15/12 0415 03/14/12 0412 03/13/12 0835 03/13/12 0100 03/12/12 1710 03/12/12 1250  WBC 9.8 11.9* 13.9* -- -- 13.2*  NEUTROABS -- -- -- -- -- 10.5*  HGB 13.8 13.5 15.3* 15.1* 16.9* --  HCT 40.3 39.6 44.2 44.3 47.9* --  MCV 92.9 93.0 92.9 -- -- 92.8  PLT 193 188 200 -- -- 195   Cardiac Enzymes: No results found for this basename: CKTOTAL:5,CKMB:5,CKMBINDEX:5,TROPONINI:5 in the last 168  hours BNP: No components found with this basename: POCBNP:5 CBG:  Lab 03/15/12 0737 03/14/12 2140 03/14/12 1557 03/14/12 1150 03/14/12 0829  GLUCAP 112* 151* 74 151* 161*    Recent Results (from the past 240 hour(s))  URINE CULTURE     Status: Normal   Collection Time   03/12/12  7:03 PM      Component Value Range Status Comment   Specimen Description URINE, CLEAN CATCH   Final    Special Requests NONE   Final    Culture  Setup Time 03/13/2012 01:48   Final    Colony Count >=100,000 COLONIES/ML   Final    Culture     Final    Value: Multiple bacterial morphotypes present, none predominant. Suggest appropriate recollection if clinically indicated.   Report Status 03/13/2012 FINAL   Final      Scheduled Meds:   . atorvastatin  20 mg Oral QODAY  . ciprofloxacin  400 mg Intravenous Q12H  . insulin aspart  0-5 Units Subcutaneous QHS  . insulin aspart  0-9 Units Subcutaneous TID WC  . metoprolol  50 mg Oral BID  . metronidazole  500 mg Intravenous Q8H  . pantoprazole  40 mg Oral BID AC  . potassium chloride  40 mEq Oral Once  . sodium chloride  3 mL Intravenous Q12H   Continuous Infusions:    Debbora Presto, MD  [facility] Pager [phone]  If 7PM-7AM, please contact night-coverage [email or web address] Password [email or web address] 03/15/2012, 11:52 AM   LOS: 3 days

## 2012-03-15 NOTE — Progress Notes (Signed)
Patient evaluated for long-term disease management services with [facility] as benefit of her [facility] insurance. Spoke with patient and husband to explain to services. Patient will receive a post discharge transition of care call and will be evaluated for monthly home visits for assessments and for education.      Raiford Noble, RN,BSN, [facility] Liaison, [phone]

## 2012-03-16 LAB — BASIC METABOLIC PANEL
CO2: 24 mEq/L (ref 19–32)
Calcium: 9 mg/dL (ref 8.4–10.5)
GFR calc non Af Amer: 76 mL/min — ABNORMAL LOW (ref >90)
Potassium: 3.5 mEq/L (ref 3.5–5.1)
Sodium: 137 mEq/L (ref 135–145)

## 2012-03-16 LAB — GLUCOSE, CAPILLARY

## 2012-03-16 LAB — CBC
Hemoglobin: 14.6 g/dL (ref 12.0–15.0)
MCH: 32.9 pg (ref 26.0–34.0)
MCHC: 35.9 g/dL (ref 30.0–36.0)
Platelets: 250 10*3/uL (ref 150–400)
RBC: 4.44 MIL/uL (ref 3.87–5.11)

## 2012-03-16 MED ORDER — CIPROFLOXACIN HCL 500 MG PO TABS
500.0000 mg | ORAL_TABLET | Freq: Two times a day (BID) | ORAL | Status: DC
  Administered 2012-03-16 – 2012-03-17 (×2): 500 mg via ORAL
  Filled 2012-03-16 (×4): qty 1

## 2012-03-16 MED ORDER — METRONIDAZOLE 500 MG PO TABS
500.0000 mg | ORAL_TABLET | Freq: Three times a day (TID) | ORAL | Status: DC
  Administered 2012-03-16 – 2012-03-17 (×3): 500 mg via ORAL
  Filled 2012-03-16 (×6): qty 1

## 2012-03-16 MED ORDER — POLYETHYLENE GLYCOL 3350 17 G PO PACK
17.0000 g | PACK | Freq: Every day | ORAL | Status: DC
  Administered 2012-03-16 – 2012-03-17 (×2): 17 g via ORAL
  Filled 2012-03-16 (×2): qty 1

## 2012-03-16 NOTE — Progress Notes (Signed)
Patient ID: Lisa Hopkins, female   DOB: [birth year], 78 y.o.   MRN: [id number]  [facility] PROGRESS NOTE  Lisa Hopkins WUJ:[id number] DOB: July 09, 1924 DOA: 03/12/2012 PCP: Nadean Corwin, MD  Brief narrative:  Patient is 78 year old female with history of atrial fibrillation on Coumadin, history of CVA, hypertension, hyperlipidemia, diabetes mellitus, hiatal hernia, GERD presented to ED with a several episodes of rectal bleeding night prior to admission. History was obtained from the patient, her husband and son present in the room. Her husband also reported that patient has been complaining of lower abdominal pain for several days, intermittent crampy and worse after eating dinner and at night. She denied any diarrhea, fever, chills, nausea, vomiting or hematemesis.   Principal Problem:  *Lower GI bleed  - related to colitis  - pt clinically improving and tolerating current diet well - continue supportive care as well as antibiotics  - CBC in AM  Active Problems:  DIABETES MELLITUS, TYPE II  - trend CBG's  Other and unspecified hyperlipidemia  - continue statin  HYPERTENSION  - stable  - continue to monitor vitals per floor protocol  Atrial fibrillation  - reate controlled   Consultants:  GI  Procedures/Studies:  Ct Abdomen Pelvis W Contrast  03/12/2012  IMPRESSION:  1. Marked wall thickening and mucosal enhancement in the descending and sigmoid colon, compatible with severe colitis. Surrounding stranding and mild ascites. The celiac trunk and mesenteric arteries appear patent, and no intramural gas is observed in the colon. This colitis is probably infectious rather than ischemic, and if the patient has been on recent antibiotics then checking for C difficile toxins may be warranted. I doubt that the appearance is due to bowel wall hematoma.  2. Large hiatal hernia, chronic.   Antibiotics:  Flagyl 10/15 -->  Cipro 10/15 -->  Code Status: Full  Family  Communication: Pt at bedside  Disposition Plan: Home likely in AM   HPI/Subjective: No events overnight.   Objective: Filed Vitals:   03/15/12 1407 03/15/12 2158 03/16/12 0504 03/16/12 1356  BP: 108/57 110/78 147/80 155/80  Pulse: 96 97 72 67  Temp: 97.5 F (36.4 C) 97.7 F (36.5 C) 97.2 F (36.2 C) 97.8 F (36.6 C)  TempSrc: Oral Oral Oral Oral  Resp: 18 18 18 16   Height:      Weight:      SpO2: 96% 96% 93% 99%    Intake/Output Summary (Last 24 hours) at 03/16/12 2015 Last data filed at 03/16/12 1900  Gross per 24 hour  Intake    783 ml  Output    600 ml  Net    183 ml    Exam:   General:  Pt is alert, follows commands appropriately, not in acute distress  Cardiovascular: Regular rate and rhythm, S1/S2, no murmurs, no rubs, no gallops  Respiratory: Clear to auscultation bilaterally, no wheezing, no crackles, no rhonchi  Abdomen: Soft, non tender, non distended, bowel sounds present, no guarding  Extremities: No edema, pulses DP and PT palpable bilaterally  Neuro: Grossly nonfocal  Data Reviewed: Basic Metabolic Panel:  Lab 03/16/12 9562 03/15/12 0415 03/14/12 0412 03/13/12 0835 03/12/12 1250  NA 137 139 139 136 138  K 3.5 3.4* 3.6 3.5 3.9  CL 104 105 107 102 100  CO2 24 26 26 23 26   GLUCOSE 113* 112* 115* 157* 140*  BUN 9 5* 6 8 14   CREATININE 0.73 0.79 0.83 0.80 0.72  CALCIUM 9.0 8.8 8.5 8.6 9.8  MG -- -- -- -- --  PHOS -- -- -- -- --   CBC:  Lab 03/16/12 0525 03/15/12 0415 03/14/12 0412 03/13/12 0835 03/13/12 0100 03/12/12 1250  WBC 7.5 9.8 11.9* 13.9* -- 13.2*  NEUTROABS -- -- -- -- -- 10.5*  HGB 14.6 13.8 13.5 15.3* 15.1* --  HCT 40.7 40.3 39.6 44.2 44.3 --  MCV 91.7 92.9 93.0 92.9 -- 92.8  PLT 250 193 188 200 -- 195   CBG:  Lab 03/16/12 1711 03/16/12 1124 03/16/12 0754 03/15/12 2236 03/15/12 1607  GLUCAP 158* 151* 111* 120* 105*    Recent Results (from the past 240 hour(s))  URINE CULTURE     Status: Normal   Collection Time    03/12/12  7:03 PM      Component Value Range Status Comment   Specimen Description URINE, CLEAN CATCH   Final    Special Requests NONE   Final    Culture  Setup Time 03/13/2012 01:48   Final    Colony Count >=100,000 COLONIES/ML   Final    Culture     Final    Value: Multiple bacterial morphotypes present, none predominant. Suggest appropriate recollection if clinically indicated.   Report Status 03/13/2012 FINAL   Final      Scheduled Meds:   . atorvastatin  20 mg Oral QODAY  . ciprofloxacin  500 mg Oral BID  . insulin aspart  0-5 Units Subcutaneous QHS  . insulin aspart  0-9 Units Subcutaneous TID WC  . metoprolol  50 mg Oral BID  . metroNIDAZOLE  500 mg Oral Q8H  . pantoprazole  40 mg Oral BID AC  . polyethylene glycol  17 g Oral Daily  . sodium chloride  3 mL Intravenous Q12H  . DISCONTD: ciprofloxacin  400 mg Intravenous Q12H  . DISCONTD: metronidazole  500 mg Intravenous Q8H   Continuous Infusions:    Lisa Presto, MD  [facility] Pager [phone]  If 7PM-7AM, please contact night-coverage [email or web address] Password [email or web address] 03/16/2012, 8:15 PM   LOS: 4 days

## 2012-03-16 NOTE — Progress Notes (Signed)
She is feeling better. Tolerating food.  Abdomen. Soft with just some mild discomfort.  IMP: Doing well  Plan. I think she can be discharged. Follow up with her pcp Dr Astrid Divine

## 2012-03-17 LAB — BASIC METABOLIC PANEL
BUN: 13 mg/dL (ref 6–23)
Calcium: 9.1 mg/dL (ref 8.4–10.5)
Creatinine, Ser: 0.89 mg/dL (ref 0.50–1.10)
GFR calc Af Amer: 67 mL/min — ABNORMAL LOW (ref >90)
GFR calc non Af Amer: 57 mL/min — ABNORMAL LOW (ref >90)
Potassium: 3.8 mEq/L (ref 3.5–5.1)

## 2012-03-17 LAB — CBC
HCT: 40.7 % (ref 36.0–46.0)
MCHC: 34.4 g/dL (ref 30.0–36.0)
RDW: 12.8 % (ref 11.5–15.5)

## 2012-03-17 MED ORDER — HYDROCODONE-ACETAMINOPHEN 5-325 MG PO TABS: 1.0000 | ORAL_TABLET | ORAL | Status: DC | PRN

## 2012-03-17 MED ORDER — POLYETHYLENE GLYCOL 3350 17 G PO PACK: 17.0000 g | PACK | Freq: Every day | ORAL | Status: DC

## 2012-03-17 MED ORDER — PANTOPRAZOLE SODIUM 40 MG PO TBEC: 40.0000 mg | DELAYED_RELEASE_TABLET | Freq: Every day | ORAL | Status: DC

## 2012-03-17 MED ORDER — CIPROFLOXACIN HCL 500 MG PO TABS: 500.0000 mg | ORAL_TABLET | Freq: Two times a day (BID) | ORAL | Status: DC

## 2012-03-17 MED ORDER — METRONIDAZOLE 500 MG PO TABS: 500.0000 mg | ORAL_TABLET | Freq: Three times a day (TID) | ORAL | Status: DC

## 2012-03-17 NOTE — Discharge Summary (Signed)
Physician Discharge Summary  Lisa Hopkins ZOX:[id number] DOB: 08-03-1924 DOA: 03/12/2012  PCP: Nadean Corwin, MD  Admit date: 03/12/2012 Discharge date: 03/17/2012  Recommendations for Outpatient Follow-up:  1. Pt will need to follow up with PCP in 2-3 weeks post discharge 2. Please obtain BMP to evaluate electrolytes and kidney function 3. Please also check CBC to evaluate Hg and Hct levels 4. Pt was discharged home on Flagyl and Ciprofloxacin to complete the therapy for 10 more days  Discharge Diagnoses: Lower GI bleed secondary to acute infectious colitis Principal Problem:  *Lower GI bleed Active Problems:  DIABETES MELLITUS, TYPE II  Other and unspecified hyperlipidemia  HYPERTENSION  Atrial fibrillation  CVA  HIATAL HERNIA  Abdominal pain, bilateral lower quadrant  Dehydration  Discharge Condition: Stable  Diet recommendation: Heart healthy diet discussed in details   Brief narrative:  Patient is 78 year old female with history of atrial fibrillation on Coumadin, history of CVA, hypertension, hyperlipidemia, diabetes mellitus, hiatal hernia, GERD presented to ED with a several episodes of rectal bleeding night prior to admission. History was obtained from the patient, her husband and son present in the room. Her husband also reported that patient has been complaining of lower abdominal pain for several days, intermittent crampy and worse after eating dinner and at night. She denied any diarrhea, fever, chills, nausea, vomiting or hematemesis.   Principal Problem:  *Lower GI bleed  - related to colitis  - pt clinically improving and tolerating current diet well  - continued supportive care as well as antibiotics  - pt responded well to the therapy and was feeling back to her usual health baseline Active Problems:  DIABETES MELLITUS, TYPE II  - trended CBG's during the hospital stay and was within target range  Other and unspecified hyperlipidemia  -  continue statin  HYPERTENSION  - stable  - continue to monitor vitals per floor protocol  Atrial fibrillation  - rate controlled   Consultants:  GI  Procedures/Studies:  Ct Abdomen Pelvis W Contrast  03/12/2012  IMPRESSION:  1. Marked wall thickening and mucosal enhancement in the descending and sigmoid colon, compatible with severe colitis. Surrounding stranding and mild ascites. The celiac trunk and mesenteric arteries appear patent, and no intramural gas is observed in the colon. This colitis is probably infectious rather than ischemic, and if the patient has been on recent antibiotics then checking for C difficile toxins may be warranted. I doubt that the appearance is due to bowel wall hematoma.  2. Large hiatal hernia, chronic.   Antibiotics:  Flagyl 10/15 -->  Cipro 10/15 -->  Code Status: Full  Family Communication: Pt at bedside  Disposition Plan: Home today  Discharge Exam: Filed Vitals:   03/17/12 0533  BP: 127/98  Pulse: 78  Temp: 97.7 F (36.5 C)  Resp: 16   Filed Vitals:   03/16/12 0504 03/16/12 1356 03/16/12 2115 03/17/12 0533  BP: 147/80 155/80 149/83 127/98  Pulse: 72 67 69 78  Temp: 97.2 F (36.2 C) 97.8 F (36.6 C) 97.4 F (36.3 C) 97.7 F (36.5 C)  TempSrc: Oral Oral Oral Oral  Resp: 18 16 18 16   Height:      Weight:      SpO2: 93% 99% 97% 96%    General: Pt is alert, follows commands appropriately, not in acute distress Cardiovascular: Regular rate and rhythm, S1/S2 +, no murmurs, no rubs, no gallops Respiratory: Clear to auscultation bilaterally, no wheezing, no crackles, no rhonchi Abdominal: Soft, non tender, non distended,  bowel sounds +, no guarding Extremities: no edema, no cyanosis, pulses palpable bilaterally DP and PT Neuro: Grossly nonfocal  Discharge Instructions  Discharge Orders    Future Orders Please Complete By Expires   Diet - low sodium heart healthy      Increase activity slowly          Medication List     As  of 03/17/2012  9:59 AM    TAKE these medications         acetaminophen 325 MG tablet   Commonly known as: TYLENOL   Take 650 mg by mouth every 4 (four) hours as needed.      ANTACID 80-20 MG Chew   Generic drug: alum hydroxide-mag trisilicate   Chew 1 tablet by mouth 3 (three) times daily as needed.      ciprofloxacin 500 MG tablet   Commonly known as: CIPRO   Take 1 tablet (500 mg total) by mouth 2 (two) times daily.      HYDROcodone-acetaminophen 5-325 MG per tablet   Commonly known as: NORCO/VICODIN   Take 1-2 tablets by mouth every 4 (four) hours as needed.      metoprolol 50 MG tablet   Commonly known as: LOPRESSOR   Take 50 mg by mouth 2 (two) times daily.      metroNIDAZOLE 500 MG tablet   Commonly known as: FLAGYL   Take 1 tablet (500 mg total) by mouth every 8 (eight) hours.      pantoprazole 40 MG tablet   Commonly known as: PROTONIX   Take 1 tablet (40 mg total) by mouth daily.      polyethylene glycol packet   Commonly known as: MIRALAX / GLYCOLAX   Take 17 g by mouth daily.      rosuvastatin 20 MG tablet   Commonly known as: CRESTOR   Take 20 mg by mouth every evening.      Vitamin D 2000 UNITS Caps   Take 2 capsules by mouth daily.      warfarin 2 MG tablet   Commonly known as: COUMADIN   Take 1-2 mg by mouth See admin instructions. 2mg  daily except Friday takes 1mg            Follow-up Information    Follow up with MCKEOWN,WILLIAM DAVID, MD. In 2 weeks.   Contact information:   [address] [place] [address] [phone]       Follow up with Graylin Shiver, MD. In 2 weeks.   Contact information:   [address], [address] [place] [address] [phone]           The results of significant diagnostics from this hospitalization (including imaging, microbiology, ancillary and laboratory) are listed below for reference.     Microbiology: Recent Results (from the past 240 hour(s))  URINE CULTURE      Status: Normal   Collection Time   03/12/12  7:03 PM      Component Value Range Status Comment   Specimen Description URINE, CLEAN CATCH   Final    Special Requests NONE   Final    Culture  Setup Time 03/13/2012 01:48   Final    Colony Count >=100,000 COLONIES/ML   Final    Culture     Final    Value: Multiple bacterial morphotypes present, none predominant. Suggest appropriate recollection if clinically indicated.   Report Status 03/13/2012 FINAL   Final      Labs: Basic Metabolic Panel:  Lab 03/17/12  0530 03/16/12 0525 03/15/12 0415 03/14/12 0412 03/13/12 0835  NA 136 137 139 139 136  K 3.8 3.5 3.4* 3.6 3.5  CL 103 104 105 107 102  CO2 23 24 26 26 23   GLUCOSE 116* 113* 112* 115* 157*  BUN 13 9 5* 6 8  CREATININE 0.89 0.73 0.79 0.83 0.80  CALCIUM 9.1 9.0 8.8 8.5 8.6  MG -- -- -- -- --  PHOS -- -- -- -- --   Liver Function Tests: No results found for this basename: AST:5,ALT:5,ALKPHOS:5,BILITOT:5,PROT:5,ALBUMIN:5 in the last 168 hours No results found for this basename: LIPASE:5,AMYLASE:5 in the last 168 hours No results found for this basename: AMMONIA:5 in the last 168 hours CBC:  Lab 03/17/12 0530 03/16/12 0525 03/15/12 0415 03/14/12 0412 03/13/12 0835 03/12/12 1250  WBC 6.9 7.5 9.8 11.9* 13.9* --  NEUTROABS -- -- -- -- -- 10.5*  HGB 14.0 14.6 13.8 13.5 15.3* --  HCT 40.7 40.7 40.3 39.6 44.2 --  MCV 92.3 91.7 92.9 93.0 92.9 --  PLT 230 250 193 188 200 --   Cardiac Enzymes: No results found for this basename: CKTOTAL:5,CKMB:5,CKMBINDEX:5,TROPONINI:5 in the last 168 hours BNP: BNP (last 3 results) No results found for this basename: PROBNP:3 in the last 8760 hours CBG:  Lab 03/17/12 0731 03/16/12 2138 03/16/12 1711 03/16/12 1124 03/16/12 0754  GLUCAP 112* 116* 158* 151* 111*     SIGNED: Time coordinating discharge: Over 30 minutes  Debbora Presto, MD  [facility] 03/17/2012, 9:59 AM Pager [phone]  If 7PM-7AM, please contact  night-coverage [email or web address] Password [email or web address]

## 2012-03-21 ENCOUNTER — Other Ambulatory Visit: Payer: Self-pay | Admitting: *Deleted

## 2012-03-21 ENCOUNTER — Other Ambulatory Visit: Payer: Self-pay | Admitting: Cardiology

## 2012-04-02 ENCOUNTER — Ambulatory Visit (INDEPENDENT_AMBULATORY_CARE_PROVIDER_SITE_OTHER): Payer: Medicare Other

## 2012-04-02 DIAGNOSIS — I4891 Unspecified atrial fibrillation: Secondary | ICD-10-CM

## 2012-04-02 DIAGNOSIS — I6350 Cerebral infarction due to unspecified occlusion or stenosis of unspecified cerebral artery: Secondary | ICD-10-CM

## 2012-04-02 LAB — POCT INR: INR: 2.6

## 2012-04-19 ENCOUNTER — Ambulatory Visit (INDEPENDENT_AMBULATORY_CARE_PROVIDER_SITE_OTHER): Payer: Medicare Other | Admitting: *Deleted

## 2012-04-19 DIAGNOSIS — I4891 Unspecified atrial fibrillation: Secondary | ICD-10-CM

## 2012-04-19 DIAGNOSIS — I6350 Cerebral infarction due to unspecified occlusion or stenosis of unspecified cerebral artery: Secondary | ICD-10-CM

## 2012-04-19 LAB — POCT INR: INR: 2.6

## 2012-05-16 ENCOUNTER — Ambulatory Visit (INDEPENDENT_AMBULATORY_CARE_PROVIDER_SITE_OTHER): Payer: Medicare Other

## 2012-05-16 DIAGNOSIS — I6350 Cerebral infarction due to unspecified occlusion or stenosis of unspecified cerebral artery: Secondary | ICD-10-CM

## 2012-05-16 DIAGNOSIS — I4891 Unspecified atrial fibrillation: Secondary | ICD-10-CM

## 2012-05-16 LAB — POCT INR: INR: 2.6

## 2012-06-27 ENCOUNTER — Ambulatory Visit (INDEPENDENT_AMBULATORY_CARE_PROVIDER_SITE_OTHER): Payer: Medicare Other

## 2012-06-27 DIAGNOSIS — I4891 Unspecified atrial fibrillation: Secondary | ICD-10-CM

## 2012-06-27 DIAGNOSIS — I6350 Cerebral infarction due to unspecified occlusion or stenosis of unspecified cerebral artery: Secondary | ICD-10-CM

## 2012-06-27 LAB — POCT INR: INR: 2.6

## 2012-06-27 MED ORDER — WARFARIN SODIUM 2 MG PO TABS: ORAL_TABLET | ORAL | Status: DC

## 2012-06-28 MED ORDER — WARFARIN SODIUM 2 MG PO TABS: ORAL_TABLET | ORAL | Status: DC

## 2012-06-28 NOTE — Addendum Note (Signed)
Addended by: Migdalia Dk on: 06/28/2012 03:05 PM   Modules accepted: Orders

## 2012-08-09 ENCOUNTER — Ambulatory Visit (INDEPENDENT_AMBULATORY_CARE_PROVIDER_SITE_OTHER): Payer: Medicare Other | Admitting: *Deleted

## 2012-08-09 DIAGNOSIS — I6350 Cerebral infarction due to unspecified occlusion or stenosis of unspecified cerebral artery: Secondary | ICD-10-CM

## 2012-08-09 DIAGNOSIS — I4891 Unspecified atrial fibrillation: Secondary | ICD-10-CM

## 2012-08-09 LAB — POCT INR: INR: 3.2

## 2012-08-22 ENCOUNTER — Ambulatory Visit (INDEPENDENT_AMBULATORY_CARE_PROVIDER_SITE_OTHER): Payer: Medicare Other

## 2012-08-22 DIAGNOSIS — I6350 Cerebral infarction due to unspecified occlusion or stenosis of unspecified cerebral artery: Secondary | ICD-10-CM

## 2012-08-22 DIAGNOSIS — I4891 Unspecified atrial fibrillation: Secondary | ICD-10-CM

## 2012-09-19 ENCOUNTER — Ambulatory Visit (INDEPENDENT_AMBULATORY_CARE_PROVIDER_SITE_OTHER): Payer: Medicare Other | Admitting: *Deleted

## 2012-09-19 DIAGNOSIS — I6350 Cerebral infarction due to unspecified occlusion or stenosis of unspecified cerebral artery: Secondary | ICD-10-CM

## 2012-09-19 DIAGNOSIS — I4891 Unspecified atrial fibrillation: Secondary | ICD-10-CM

## 2012-10-10 ENCOUNTER — Ambulatory Visit (INDEPENDENT_AMBULATORY_CARE_PROVIDER_SITE_OTHER): Payer: Medicare Other

## 2012-10-10 DIAGNOSIS — I4891 Unspecified atrial fibrillation: Secondary | ICD-10-CM

## 2012-10-10 DIAGNOSIS — I6350 Cerebral infarction due to unspecified occlusion or stenosis of unspecified cerebral artery: Secondary | ICD-10-CM

## 2012-10-31 ENCOUNTER — Ambulatory Visit (INDEPENDENT_AMBULATORY_CARE_PROVIDER_SITE_OTHER): Payer: Medicare Other | Admitting: *Deleted

## 2012-10-31 DIAGNOSIS — I4891 Unspecified atrial fibrillation: Secondary | ICD-10-CM

## 2012-10-31 DIAGNOSIS — I6350 Cerebral infarction due to unspecified occlusion or stenosis of unspecified cerebral artery: Secondary | ICD-10-CM

## 2012-11-12 ENCOUNTER — Ambulatory Visit (INDEPENDENT_AMBULATORY_CARE_PROVIDER_SITE_OTHER): Payer: Medicare Other | Admitting: *Deleted

## 2012-11-12 DIAGNOSIS — I4891 Unspecified atrial fibrillation: Secondary | ICD-10-CM

## 2012-11-12 DIAGNOSIS — I6350 Cerebral infarction due to unspecified occlusion or stenosis of unspecified cerebral artery: Secondary | ICD-10-CM

## 2012-11-28 ENCOUNTER — Ambulatory Visit (INDEPENDENT_AMBULATORY_CARE_PROVIDER_SITE_OTHER): Payer: Medicare Other | Admitting: *Deleted

## 2012-11-28 DIAGNOSIS — I6350 Cerebral infarction due to unspecified occlusion or stenosis of unspecified cerebral artery: Secondary | ICD-10-CM

## 2012-11-28 DIAGNOSIS — I4891 Unspecified atrial fibrillation: Secondary | ICD-10-CM

## 2012-12-19 ENCOUNTER — Ambulatory Visit (INDEPENDENT_AMBULATORY_CARE_PROVIDER_SITE_OTHER): Payer: Medicare Other

## 2012-12-19 DIAGNOSIS — I4891 Unspecified atrial fibrillation: Secondary | ICD-10-CM

## 2012-12-19 DIAGNOSIS — I6350 Cerebral infarction due to unspecified occlusion or stenosis of unspecified cerebral artery: Secondary | ICD-10-CM

## 2012-12-19 LAB — POCT INR: INR: 3.6

## 2012-12-24 ENCOUNTER — Telehealth: Payer: Self-pay | Admitting: *Deleted

## 2012-12-24 MED ORDER — WARFARIN SODIUM 2 MG PO TABS: ORAL_TABLET | ORAL | Status: DC

## 2012-12-24 NOTE — Telephone Encounter (Signed)
Wants RX on Warfarin

## 2012-12-24 NOTE — Addendum Note (Signed)
Addended by: Raul Del on: 12/24/2012 04:36 PM   Modules accepted: Orders

## 2012-12-24 NOTE — Telephone Encounter (Signed)
Pt called again for refill on Warfarin

## 2012-12-31 ENCOUNTER — Other Ambulatory Visit (HOSPITAL_COMMUNITY): Payer: Self-pay | Admitting: Internal Medicine

## 2012-12-31 DIAGNOSIS — Z1231 Encounter for screening mammogram for malignant neoplasm of breast: Secondary | ICD-10-CM

## 2013-01-02 ENCOUNTER — Ambulatory Visit (INDEPENDENT_AMBULATORY_CARE_PROVIDER_SITE_OTHER): Payer: Medicare Other

## 2013-01-02 DIAGNOSIS — I6350 Cerebral infarction due to unspecified occlusion or stenosis of unspecified cerebral artery: Secondary | ICD-10-CM

## 2013-01-02 DIAGNOSIS — I4891 Unspecified atrial fibrillation: Secondary | ICD-10-CM

## 2013-01-02 LAB — POCT INR: INR: 3.5

## 2013-01-16 ENCOUNTER — Ambulatory Visit (INDEPENDENT_AMBULATORY_CARE_PROVIDER_SITE_OTHER): Payer: Medicare Other | Admitting: *Deleted

## 2013-01-16 DIAGNOSIS — I4891 Unspecified atrial fibrillation: Secondary | ICD-10-CM

## 2013-01-16 DIAGNOSIS — I6350 Cerebral infarction due to unspecified occlusion or stenosis of unspecified cerebral artery: Secondary | ICD-10-CM

## 2013-01-28 ENCOUNTER — Ambulatory Visit (HOSPITAL_COMMUNITY)
Admission: RE | Admit: 2013-01-28 | Discharge: 2013-01-28 | Disposition: A | Discharge status: Home / Self Care | Payer: Medicare Other | Source: Ambulatory Visit | Attending: Internal Medicine | Admitting: Internal Medicine

## 2013-01-28 DIAGNOSIS — Z1231 Encounter for screening mammogram for malignant neoplasm of breast: Secondary | ICD-10-CM | POA: Insufficient documentation

## 2013-02-13 ENCOUNTER — Ambulatory Visit (INDEPENDENT_AMBULATORY_CARE_PROVIDER_SITE_OTHER): Payer: Medicare Other | Admitting: Pharmacist

## 2013-02-13 DIAGNOSIS — I6350 Cerebral infarction due to unspecified occlusion or stenosis of unspecified cerebral artery: Secondary | ICD-10-CM

## 2013-02-13 DIAGNOSIS — I4891 Unspecified atrial fibrillation: Secondary | ICD-10-CM

## 2013-02-13 LAB — PROTIME-INR
INR: 8.4 ratio (ref 0.8–1.0)
Prothrombin Time: 84.8 s (ref 10.2–12.4)

## 2013-02-13 LAB — POCT INR: INR: >8

## 2013-02-18 ENCOUNTER — Ambulatory Visit (INDEPENDENT_AMBULATORY_CARE_PROVIDER_SITE_OTHER): Payer: Medicare Other

## 2013-02-18 DIAGNOSIS — I6350 Cerebral infarction due to unspecified occlusion or stenosis of unspecified cerebral artery: Secondary | ICD-10-CM

## 2013-02-18 DIAGNOSIS — I4891 Unspecified atrial fibrillation: Secondary | ICD-10-CM

## 2013-02-25 ENCOUNTER — Ambulatory Visit (INDEPENDENT_AMBULATORY_CARE_PROVIDER_SITE_OTHER): Payer: Medicare Other

## 2013-02-25 DIAGNOSIS — I4891 Unspecified atrial fibrillation: Secondary | ICD-10-CM

## 2013-02-25 DIAGNOSIS — I6350 Cerebral infarction due to unspecified occlusion or stenosis of unspecified cerebral artery: Secondary | ICD-10-CM

## 2013-03-07 ENCOUNTER — Ambulatory Visit (INDEPENDENT_AMBULATORY_CARE_PROVIDER_SITE_OTHER): Payer: Medicare Other | Admitting: Pharmacist

## 2013-03-07 DIAGNOSIS — I4891 Unspecified atrial fibrillation: Secondary | ICD-10-CM

## 2013-03-07 DIAGNOSIS — I6350 Cerebral infarction due to unspecified occlusion or stenosis of unspecified cerebral artery: Secondary | ICD-10-CM

## 2013-03-07 LAB — POCT INR: INR: 1.8

## 2013-03-17 ENCOUNTER — Ambulatory Visit (INDEPENDENT_AMBULATORY_CARE_PROVIDER_SITE_OTHER): Payer: Medicare Other | Admitting: General Practice

## 2013-03-17 DIAGNOSIS — I4891 Unspecified atrial fibrillation: Secondary | ICD-10-CM

## 2013-03-17 DIAGNOSIS — I6350 Cerebral infarction due to unspecified occlusion or stenosis of unspecified cerebral artery: Secondary | ICD-10-CM

## 2013-03-27 ENCOUNTER — Ambulatory Visit (INDEPENDENT_AMBULATORY_CARE_PROVIDER_SITE_OTHER): Payer: Medicare Other | Admitting: General Practice

## 2013-03-27 DIAGNOSIS — I6350 Cerebral infarction due to unspecified occlusion or stenosis of unspecified cerebral artery: Secondary | ICD-10-CM

## 2013-03-27 DIAGNOSIS — I4891 Unspecified atrial fibrillation: Secondary | ICD-10-CM

## 2013-04-08 ENCOUNTER — Ambulatory Visit (INDEPENDENT_AMBULATORY_CARE_PROVIDER_SITE_OTHER): Payer: Medicare Other | Admitting: General Practice

## 2013-04-08 DIAGNOSIS — I4891 Unspecified atrial fibrillation: Secondary | ICD-10-CM

## 2013-04-08 DIAGNOSIS — I6350 Cerebral infarction due to unspecified occlusion or stenosis of unspecified cerebral artery: Secondary | ICD-10-CM

## 2013-04-22 ENCOUNTER — Ambulatory Visit (INDEPENDENT_AMBULATORY_CARE_PROVIDER_SITE_OTHER): Payer: Medicare Other | Admitting: General Practice

## 2013-04-22 DIAGNOSIS — I6350 Cerebral infarction due to unspecified occlusion or stenosis of unspecified cerebral artery: Secondary | ICD-10-CM

## 2013-04-22 DIAGNOSIS — I4891 Unspecified atrial fibrillation: Secondary | ICD-10-CM

## 2013-04-22 LAB — POCT INR: INR: 2

## 2013-04-27 ENCOUNTER — Encounter: Payer: Self-pay | Admitting: Physician Assistant

## 2013-04-27 DIAGNOSIS — E559 Vitamin D deficiency, unspecified: Secondary | ICD-10-CM | POA: Insufficient documentation

## 2013-04-27 DIAGNOSIS — I10 Essential (primary) hypertension: Secondary | ICD-10-CM

## 2013-04-27 DIAGNOSIS — E782 Mixed hyperlipidemia: Secondary | ICD-10-CM | POA: Insufficient documentation

## 2013-04-27 DIAGNOSIS — E785 Hyperlipidemia, unspecified: Secondary | ICD-10-CM

## 2013-04-27 DIAGNOSIS — E1169 Type 2 diabetes mellitus with other specified complication: Secondary | ICD-10-CM | POA: Insufficient documentation

## 2013-04-30 ENCOUNTER — Ambulatory Visit (INDEPENDENT_AMBULATORY_CARE_PROVIDER_SITE_OTHER): Payer: Medicare Other | Admitting: Physician Assistant

## 2013-04-30 ENCOUNTER — Encounter: Payer: Self-pay | Admitting: Physician Assistant

## 2013-04-30 VITALS — BP 126/62 | HR 68 | Temp 98.7°F | Resp 16 | Ht 62.0 in | Wt 119.0 lb

## 2013-04-30 DIAGNOSIS — E785 Hyperlipidemia, unspecified: Secondary | ICD-10-CM

## 2013-04-30 DIAGNOSIS — E559 Vitamin D deficiency, unspecified: Secondary | ICD-10-CM

## 2013-04-30 DIAGNOSIS — R7309 Other abnormal glucose: Secondary | ICD-10-CM

## 2013-04-30 DIAGNOSIS — E119 Type 2 diabetes mellitus without complications: Secondary | ICD-10-CM

## 2013-04-30 DIAGNOSIS — E782 Mixed hyperlipidemia: Secondary | ICD-10-CM

## 2013-04-30 DIAGNOSIS — I10 Essential (primary) hypertension: Secondary | ICD-10-CM

## 2013-04-30 DIAGNOSIS — K219 Gastro-esophageal reflux disease without esophagitis: Secondary | ICD-10-CM

## 2013-04-30 LAB — CBC WITH DIFFERENTIAL/PLATELET
Eosinophils Relative: 1 % (ref 0–5)
HCT: 43.4 % (ref 36.0–46.0)
Lymphocytes Relative: 36 % (ref 12–46)
Lymphs Abs: 2.7 10*3/uL (ref 0.7–4.0)
MCV: 94.3 fL (ref 78.0–100.0)
Monocytes Absolute: 0.7 10*3/uL (ref 0.1–1.0)
Neutro Abs: 4 10*3/uL (ref 1.7–7.7)
RBC: 4.6 MIL/uL (ref 3.87–5.11)
RDW: 13.5 % (ref 11.5–15.5)
WBC: 7.5 10*3/uL (ref 4.0–10.5)

## 2013-04-30 LAB — BASIC METABOLIC PANEL WITH GFR
BUN: 19 mg/dL (ref 6–23)
CO2: 33 mEq/L — ABNORMAL HIGH (ref 19–32)
Chloride: 98 mEq/L (ref 96–112)
Creat: 0.89 mg/dL (ref 0.50–1.10)
GFR, Est African American: 68 mL/min
Glucose, Bld: 125 mg/dL — ABNORMAL HIGH (ref 70–99)
Potassium: 3.9 mEq/L (ref 3.5–5.3)
Sodium: 140 mEq/L (ref 135–145)

## 2013-04-30 LAB — HEPATIC FUNCTION PANEL
AST: 23 U/L (ref 0–37)
Albumin: 4.3 g/dL (ref 3.5–5.2)
Alkaline Phosphatase: 69 U/L (ref 39–117)
Bilirubin, Direct: 0.2 mg/dL (ref 0.0–0.3)
Total Protein: 7.2 g/dL (ref 6.0–8.3)

## 2013-04-30 NOTE — Patient Instructions (Addendum)
Hyoscyamine is best for stomach cramping, bloating, and nausea but it can cause constipation.  Hydrococo/Acetamine 5/325- is more for joint pain Take pantoprazole before food on an empty stomach.  Follow up with Dr. Matthias Hughs  Diet for Gastroesophageal Reflux Disease, Adult Reflux (acid reflux) is when acid from your stomach flows up into the esophagus. When acid comes in contact with the esophagus, the acid causes irritation and soreness (inflammation) in the esophagus. When reflux happens often or so severely that it causes damage to the esophagus, it is called gastroesophageal reflux disease (GERD). Nutrition therapy can help ease the discomfort of GERD. FOODS OR DRINKS TO AVOID OR LIMIT  Smoking or chewing tobacco. Nicotine is one of the most potent stimulants to acid production in the gastrointestinal tract.  Caffeinated and decaffeinated coffee and black tea.  Regular or low-calorie carbonated beverages or energy drinks (caffeine-free carbonated beverages are allowed).   Strong spices, such as black pepper, white pepper, red pepper, cayenne, curry powder, and chili powder.  Peppermint or spearmint.  Chocolate.  High-fat foods, including meats and fried foods. Extra added fats including oils, butter, salad dressings, and nuts. Limit these to less than 8 tsp per day.  Fruits and vegetables if they are not tolerated, such as citrus fruits or tomatoes.  Alcohol.  Any food that seems to aggravate your condition. If you have questions regarding your diet, call your caregiver or a registered dietitian. OTHER THINGS THAT MAY HELP GERD INCLUDE:   Eating your meals slowly, in a relaxed setting.  Eating 5 to 6 small meals per day instead of 3 large meals.  Eliminating food for a period of time if it causes distress.  Not lying down until 3 hours after eating a meal.  Keeping the head of your bed raised 6 to 9 inches (15 to 23 cm) by using a foam wedge or blocks under the legs  of the bed. Lying flat may make symptoms worse.  Being physically active. Weight loss may be helpful in reducing reflux in overweight or obese adults.  Wear loose fitting clothing EXAMPLE MEAL PLAN This meal plan is approximately 2,000 calories based on https://www.bern[facility].org/ meal planning guidelines. Breakfast   cup cooked oatmeal.  1 cup strawberries.  1 cup low-fat milk.  1 oz almonds. Snack  1 cup cucumber slices.  6 oz yogurt (made from low-fat or fat-free milk). Lunch  2 slice whole-wheat bread.  2 oz sliced [place].  2 tsp mayonnaise.  1 cup blueberries.  1 cup snap peas. Snack  6 whole-wheat crackers.  1 oz string cheese. Dinner   cup brown rice.  1 cup mixed veggies.  1 tsp olive oil.  3 oz grilled fish. Document Released: 05/15/2005 Document Revised: 08/07/2011 Document Reviewed: 03/31/2011 [facility] Patient Information 2014 [place], [place].

## 2013-04-30 NOTE — Progress Notes (Signed)
HPI Patient presents for 3 month follow up with hypertension, hyperlipidemia, prediabetes and vitamin D. Patient's blood pressure has been controlled at home. Patient denies chest pain, shortness of breath, dizziness.  Patient's cholesterol is diet controlled. In addition they are on crestor 20 and denies myalgias. The cholesterol last visit was 83. The patient has been working on diet and exercise for prediabetes, and denies changes in vision, polys, and paresthesias.  A1C 6.5 (6.3) When she eats, 30 mins later she continues to have GERD and ab bloating. She is on Protonix, levsin and still not helping.  Patient is on Vitamin D supplement. Vitamin D 88 Current Medications:  Current Outpatient Prescriptions on File Prior to Visit  Medication Sig Dispense Refill  . acetaminophen (TYLENOL) 325 MG tablet Take 650 mg by mouth every 4 (four) hours as needed.        Marland Kitchen alum hydroxide-mag trisilicate (ANTACID) 80-20 MG CHEW Chew 1 tablet by mouth 3 (three) times daily as needed.      . Cholecalciferol (VITAMIN D) 2000 UNITS CAPS Take 2 capsules by mouth daily.       . hydrochlorothiazide (HYDRODIURIL) 25 MG tablet Take 25 mg by mouth every other day.      Marland Kitchen HYDROcodone-acetaminophen (NORCO/VICODIN) 5-325 MG per tablet Take 1-2 tablets by mouth every 4 (four) hours as needed.  30 tablet  0  . metoprolol (LOPRESSOR) 50 MG tablet Take 50 mg by mouth 2 (two) times daily.        . pantoprazole (PROTONIX) 40 MG tablet Take 1 tablet (40 mg total) by mouth daily.  30 tablet  0  . polyethylene glycol (MIRALAX / GLYCOLAX) packet Take 17 g by mouth daily.  14 each  1  . rosuvastatin (CRESTOR) 20 MG tablet Take 20 mg by mouth every evening.       . warfarin (COUMADIN) 2 MG tablet Take as directed by anticoagulation clinic  35 tablet  3   No current facility-administered medications on file prior to visit.   Medical History:  Past Medical History  Diagnosis Date  . Atrial fibrillation   . Aphasia as late  effect of cerebrovascular accident   . CVA (cerebral infarction)   . Hyperlipidemia   . HTN (hypertension)   . Anxiety   . Diabetes mellitus type II   . Hiatal hernia   . GERD (gastroesophageal reflux disease)   . IBS (irritable bowel syndrome)   . Vitamin D deficiency    Allergies:  Allergies  Allergen Reactions  . Ace Inhibitors     Cough  . Fosamax [Alendronate Sodium]     Heart burn  . Norvasc [Amlodipine Besylate]     edema  . Zocor [Simvastatin]     Elevated CPK    ROS Constitutional: Denies fever, chills, weight loss/gain, headaches, insomnia, fatigue, night sweats, and change in appetite. Eyes: Denies redness, blurred vision, diplopia, discharge, itchy, watery eyes.  ENT: Denies discharge, congestion, post nasal drip, sore throat, earache, dental pain, Tinnitus, Vertigo, Sinus pain, snoring.  Cardio: Denies chest pain, palpitations, irregular heartbeat,  dyspnea, diaphoresis, orthopnea, PND, claudication, edema Respiratory: denies cough, dyspnea,pleurisy, hoarseness, wheezing.  Gastrointestinal: + GERD, bloating, mild dysphagia, cramps. Denies pain, nausea, vomiting, diarrhea, constipation, hematemesis, melena, hematochezia,  hemorrhoids Genitourinary: Denies dysuria, frequency, urgency, nocturia, hesitancy, discharge, hematuria, flank pain Musculoskeletal: Denies arthralgia, myalgia, stiffness, Jt. Swelling, pain, limp, and strain/sprain. Skin: Denies pruritis, rash, hives, warts, acne, eczema, changing in skin lesion Neuro: Denies Weakness, tremor, incoordination, spasms, paresthesia, pain  Psychiatric: Denies confusion, memory loss, sensory loss Endocrine: Denies change in weight, skin, hair change, nocturia, and paresthesia, Diabetic Polys, Denies visual blurring, hyper /hypo glycemic episodes.  Heme/Lymph: Denies Excessive bleeding, bruising, enlarged lymph nodes  Family history- Review and unchanged Social history- Review and unchanged Physical Exam: Filed  Vitals:   04/30/13 1327  BP: 126/62  Pulse: 68  Temp: 98.7 F (37.1 C)  Resp: 16   Filed Weights   04/30/13 1327  Weight: 119 lb (53.978 kg)   General Appearance: Well nourished, in no apparent distress. Eyes: PERRLA, EOMs, conjunctiva no swelling or erythema, normal fundi and vessels. Sinuses: No Frontal/maxillary tenderness ENT/Mouth: Ext aud canals clear, with TMs without erythema, bulging.No erythema, swelling, or exudate on post pharynx.  Tonsils not swollen or erythematous. Hearing normal.  Neck: Supple, thyroid normal.  Respiratory: Respiratory effort normal, BS equal bilaterally without rales, rhonchi, wheezing or stridor.  Cardio: Heart sounds normal, irrefular rate and rhythm without murmurs, rubs or gallops.  Abdomen: Flat, soft, with bowel sounds.+ epigastric tenderness without rebound, hernias, masses, or organomegaly.  Lymphatics: Non tender without lymphadenopathy.  Musculoskeletal: Full ROM all peripheral extremities, joint stability, 5/5 strength, and normal gait. Skin: Warm, dry without rashes, lesions, ecchymosis.  Neuro: Cranial nerves intact, reflexes equal bilaterally. Normal muscle tone, no cerebellar symptoms. Sensation intact.  Psych: Awake and oriented X 3, normal affect, Insight and Judgment appropriate.   Assessment and Plan:  Hypertension: Continue medication, monitor blood pressure at home. Continue DASH diet. Cholesterol: Continue diet and exercise. Check cholesterol.  Pre-diabetes-Continue diet and exercise. Check A1C Vitamin D Def- check level and continue medications.  GERD- check Hpylori, cont protonix and follow up with GI.  Afib- follows up with coumadin clinic  Quentin Mulling 1:35 PM

## 2013-05-01 ENCOUNTER — Ambulatory Visit: Payer: Self-pay | Admitting: Physician Assistant

## 2013-05-01 LAB — HELICOBACTER PYLORI ABS-IGG+IGA, BLD
H Pylori IgG: 0.57 {ISR}
HELICOBACTER PYLORI AB, IGA: 1.8 U/mL (ref <9.0)

## 2013-05-01 LAB — HEMOGLOBIN A1C: Hgb A1c MFr Bld: 6.4 % — ABNORMAL HIGH (ref <5.7)

## 2013-05-01 LAB — TSH: TSH: 0.624 u[IU]/mL (ref 0.350–4.500)

## 2013-05-02 ENCOUNTER — Telehealth: Payer: Self-pay | Admitting: *Deleted

## 2013-05-06 NOTE — Telephone Encounter (Signed)
DONE

## 2013-05-13 ENCOUNTER — Ambulatory Visit (INDEPENDENT_AMBULATORY_CARE_PROVIDER_SITE_OTHER): Payer: Medicare Other | Admitting: *Deleted

## 2013-05-13 DIAGNOSIS — I4891 Unspecified atrial fibrillation: Secondary | ICD-10-CM

## 2013-05-13 DIAGNOSIS — I6350 Cerebral infarction due to unspecified occlusion or stenosis of unspecified cerebral artery: Secondary | ICD-10-CM

## 2013-05-13 LAB — POCT INR: INR: 1.9

## 2013-05-26 ENCOUNTER — Other Ambulatory Visit: Payer: Self-pay | Admitting: Physician Assistant

## 2013-05-26 MED ORDER — ROSUVASTATIN CALCIUM 20 MG PO TABS: 20.0000 mg | ORAL_TABLET | Freq: Every evening | ORAL | Status: DC

## 2013-05-29 ENCOUNTER — Other Ambulatory Visit: Payer: Self-pay | Admitting: Internal Medicine

## 2013-05-30 ENCOUNTER — Other Ambulatory Visit: Payer: Self-pay | Admitting: Internal Medicine

## 2013-06-03 ENCOUNTER — Ambulatory Visit (INDEPENDENT_AMBULATORY_CARE_PROVIDER_SITE_OTHER): Payer: Medicare Other | Admitting: *Deleted

## 2013-06-03 DIAGNOSIS — I6350 Cerebral infarction due to unspecified occlusion or stenosis of unspecified cerebral artery: Secondary | ICD-10-CM

## 2013-06-03 DIAGNOSIS — I4891 Unspecified atrial fibrillation: Secondary | ICD-10-CM

## 2013-06-03 LAB — POCT INR: INR: 2.1

## 2013-06-18 ENCOUNTER — Encounter: Payer: Self-pay | Admitting: Cardiology

## 2013-06-18 ENCOUNTER — Ambulatory Visit (INDEPENDENT_AMBULATORY_CARE_PROVIDER_SITE_OTHER): Payer: Medicare Other | Admitting: Cardiology

## 2013-06-18 ENCOUNTER — Encounter: Payer: Self-pay | Admitting: Gastroenterology

## 2013-06-18 VITALS — BP 126/76 | HR 74 | Ht 62.0 in | Wt 121.0 lb

## 2013-06-18 DIAGNOSIS — R109 Unspecified abdominal pain: Secondary | ICD-10-CM

## 2013-06-18 DIAGNOSIS — I4891 Unspecified atrial fibrillation: Secondary | ICD-10-CM

## 2013-06-18 DIAGNOSIS — K219 Gastro-esophageal reflux disease without esophagitis: Secondary | ICD-10-CM

## 2013-06-18 NOTE — Patient Instructions (Addendum)
**Note De-Identified  Obfuscation** Your physician recommends that you continue on your current medications as directed. Please refer to the Current Medication list given to you today.  Your physician is referring you to a Gastroenterologist for your abdominal pain and reflux  Your physician wants you to follow-up in: 1 year. You will receive a reminder letter in the mail two months in advance. If you don't receive a letter, please call our office to schedule the follow-up appointment.

## 2013-06-18 NOTE — Progress Notes (Signed)
Patient ID: Lisa Hopkins, female   DOB: 15-Sep-1924, 80 y.o.   MRN: [id number]     Patient Name: Lisa Hopkins Date of Encounter: 06/18/2013  Primary Care Provider:  Nadean Corwin, MD Primary Cardiologist:  Tobias Alexander, H   Problem List   Past Medical History  Diagnosis Date  . Atrial fibrillation   . Aphasia as late effect of cerebrovascular accident   . CVA (cerebral infarction)   . Hyperlipidemia   . HTN (hypertension)   . Anxiety   . Diabetes mellitus type II   . Hiatal hernia   . GERD (gastroesophageal reflux disease)   . IBS (irritable bowel syndrome)   . Vitamin D deficiency    Past Surgical History  Procedure Laterality Date  . Upper gastrointestinal endoscopy  2005  . Colonoscopy  2005  . Cholecystectomy    . Cataract extraction, bilateral    . Tonsillectomy      Allergies  Allergies  Allergen Reactions  . Ace Inhibitors     Cough  . Fosamax [Alendronate Sodium]     Heart burn  . Norvasc [Amlodipine Besylate]     edema  . Zocor [Simvastatin]     Elevated CPK    HPI  Lisa Hopkins is a pleasant 80 year old female with h/o chronic A. Fib and anticoagulation post CVA in 2010. She has been stable since then, no recurrent stroke, no DOE, CP, LE edema or DOE or palpitations.  She very compliant with her medications. She is complaining of lower abdominal pain that progresses to epigastric pain and retrosternal burning. She used to experience it daily after dinner, but recently it has become less frequent. She denies change in apettite, diarhea, bloody or black tarry stools.  She has h/o GERD and acute ischemic colitis 2 years ago, it seems that she didn't follow with a gastroenterologist since then.  Home Medications  Prior to Admission medications   Medication Sig Start Date End Date Taking? Authorizing Provider  acetaminophen (TYLENOL) 325 MG tablet Take 650 mg by mouth every 4 (four) hours as needed.     Yes Historical Provider, MD   alum hydroxide-mag trisilicate (ANTACID) 80-20 MG CHEW Chew 1 tablet by mouth 3 (three) times daily as needed.   Yes Historical Provider, MD  Cholecalciferol (VITAMIN D) 2000 UNITS CAPS Take 2 capsules by mouth daily.    Yes Historical Provider, MD  hydrochlorothiazide (HYDRODIURIL) 25 MG tablet TAKE ONE TABLET BY MOUTH IN THE MORNING FOR BLOOD PRESSURE AND FLUID 05/30/13  Yes Lucky Cowboy, MD  HYDROcodone-acetaminophen (NORCO/VICODIN) 5-325 MG per tablet Take 1-2 tablets by mouth every 4 (four) hours as needed. 03/17/12  Yes Dorothea Ogle, MD  metoprolol (LOPRESSOR) 50 MG tablet Take 50 mg by mouth 2 (two) times daily.     Yes Historical Provider, MD  pantoprazole (PROTONIX) 40 MG tablet Take 1 tablet (40 mg total) by mouth daily. 03/17/12  Yes Dorothea Ogle, MD  polyethylene glycol (MIRALAX / Ethelene Hal) packet Take 17 g by mouth daily. 03/17/12  Yes Dorothea Ogle, MD  rosuvastatin (CRESTOR) 20 MG tablet Take 1 tablet (20 mg total) by mouth every evening. 05/26/13  Yes Quentin Mulling, PA-C  warfarin (COUMADIN) 2 MG tablet Take as directed by anticoagulation clinic 12/24/12  Yes Gaylord Shih, MD    Family History  Family History  Problem Relation Age of Onset  . Diabetes Mother   . Stroke Mother   . Hypertension Brother   . Heart  disease Brother   . Arthritis Sister     Rhematoid  . Arthritis Sister     Rhematoid    Social History  History   Social History  . Marital Status: Married    Spouse Name: N/A    Number of Children: N/A  . Years of Education: N/A   Occupational History  . Not on file.   Social History Main Topics  . Smoking status: Never Smoker   . Smokeless tobacco: Never Used  . Alcohol Use: No  . Drug Use: No  . Sexual Activity: No   Other Topics Concern  . Not on file   Social History Narrative  . No narrative on file     Review of Systems, as per HPI, otherwise negative General:  No chills, fever, night sweats or weight changes.  Cardiovascular:   No chest pain, dyspnea on exertion, edema, orthopnea, palpitations, paroxysmal nocturnal dyspnea. Dermatological: No rash, lesions/masses Respiratory: No cough, dyspnea Urologic: No hematuria, dysuria Abdominal:   No nausea, vomiting, diarrhea, bright red blood per rectum, melena, or hematemesis Neurologic:  No visual changes, wkns, changes in mental status. All other systems reviewed and are otherwise negative except as noted above.  Physical Exam  Blood pressure 126/76, pulse 74, height 5\' 2"  (1.575 m), weight 121 lb (54.885 kg).  General: Pleasant, NAD Psych: Normal affect. Neuro: Alert and oriented X 3. Moves all extremities spontaneously. HEENT: Normal  Neck: Supple without bruits or JVD. Lungs:  Resp regular and unlabored, CTA. Heart: RRR no s3, s4, or murmurs. Abdomen: Soft, non-tender, non-distended, BS + x 4.  Extremities: No clubbing, cyanosis or edema. DP/PT/Radials 2+ and equal bilaterally.  Labs:  No results found for this basename: CKTOTAL, CKMB, TROPONINI,  in the last 72 hours Lab Results  Component Value Date   WBC 7.5 04/30/2013   HGB 15.2* 04/30/2013   HCT 43.4 04/30/2013   MCV 94.3 04/30/2013   PLT 241 04/30/2013   No results found for this basename: NA, K, CL, CO2, BUN, CREATININE, CALCIUM, LABALBU, PROT, BILITOT, ALKPHOS, ALT, AST, GLUCOSE,  in the last 168 hours Lab Results  Component Value Date   CHOL 163 04/30/2013   HDL 50 04/30/2013   LDLCALC 81 04/30/2013   TRIG 161* 04/30/2013   No results found for this basename: DDIMER   No components found with this basename: POCBNP,   Accessory Clinical Findings  echocardiogram  ECG - Atrial fibrillation, non-specific ST-T wave changes  Procedures/Studies:  Ct Abdomen Pelvis W Contrast  03/12/2012  IMPRESSION:  1. Marked wall thickening and mucosal enhancement in the descending and sigmoid colon, compatible with severe colitis. Surrounding stranding and mild ascites. The celiac trunk and mesenteric arteries  appear patent, and no intramural gas is observed in the colon. This colitis is probably infectious rather than ischemic, and if the patient has been on recent antibiotics then checking for C difficile toxins may be warranted. I doubt that the appearance is due to bowel wall hematoma.  2. Large hiatal hernia, chronic.    Echocardiogram 02/22/2013  - Left ventricle: The cavity size was normal. Systolic function was normal. The estimated ejection fraction was in the range of 55% to 60%. Wall motion was normal; there were no regional wall motion abnormalities. Doppler parameters are consistent with abnormal left ventricular relaxation (grade 1 diastolic dysfunction). Doppler parameters are consistent with elevated mean left atrial filling pressure. - Aortic valve: Mild regurgitation. - Mitral valve: Moderately calcified annulus. Mild regurgitation. - Right ventricle:  Systolic pressure was increased. - Right atrium: The atrium was mildly dilated. - Tricuspid valve: Mild regurgitation. - Pulmonary arteries: PA peak pressure: 46mm Hg (S). Impressions:  - The right ventricular systolic pressure was increased consistent with moderate pulmonary hypertension.    Assessment & Plan  1. Chronic a-fib, h/o CVA on chronic anticoagulation with coumadine - rate controlled, complaint to her meds, no more strokes  2. Hypertension - well controlled  3. Hyperlipidemia - at goal  4. Pulmonary hypertension - the patient is asymptomatic and well compensated  5. Abdominal pain - we will refer to GI specialist considering her h/o GERD and infectious colitis in 2013 with no follow up.  In case the patient requires EGD or colonoscopy there is no contraindication from cardiac standpoint to undergo such surgery.  Follow up in 1 year  Lars Masson, MD, [facility] 06/18/2013, 4:03 PM

## 2013-06-20 ENCOUNTER — Telehealth: Payer: Self-pay | Admitting: *Deleted

## 2013-06-20 ENCOUNTER — Other Ambulatory Visit: Payer: Self-pay | Admitting: Cardiology

## 2013-06-20 ENCOUNTER — Other Ambulatory Visit: Payer: Self-pay | Admitting: Internal Medicine

## 2013-06-20 NOTE — Telephone Encounter (Signed)
Patient called. She was seen by Dr Nelson,cardiologist, and was referred to Dr Melvia Heapsobert Kaplan.  Pt states she does not want to GI at this time and asked our office to cancel appointment. OK per Dr Oneta RackMcKeown and done.

## 2013-06-23 ENCOUNTER — Ambulatory Visit: Payer: Medicare Other | Admitting: Gastroenterology

## 2013-06-26 ENCOUNTER — Ambulatory Visit: Payer: Medicare Other | Admitting: Gastroenterology

## 2013-07-01 ENCOUNTER — Ambulatory Visit (INDEPENDENT_AMBULATORY_CARE_PROVIDER_SITE_OTHER): Payer: Medicare Other

## 2013-07-01 DIAGNOSIS — Z79899 Other long term (current) drug therapy: Secondary | ICD-10-CM | POA: Insufficient documentation

## 2013-07-01 DIAGNOSIS — Z5181 Encounter for therapeutic drug level monitoring: Secondary | ICD-10-CM

## 2013-07-01 DIAGNOSIS — I6350 Cerebral infarction due to unspecified occlusion or stenosis of unspecified cerebral artery: Secondary | ICD-10-CM

## 2013-07-01 DIAGNOSIS — I4891 Unspecified atrial fibrillation: Secondary | ICD-10-CM

## 2013-07-01 LAB — POCT INR: INR: 1.7

## 2013-07-09 ENCOUNTER — Other Ambulatory Visit: Payer: Self-pay | Admitting: Physician Assistant

## 2013-07-25 ENCOUNTER — Ambulatory Visit (INDEPENDENT_AMBULATORY_CARE_PROVIDER_SITE_OTHER): Payer: Medicare Other | Admitting: Pharmacist

## 2013-07-25 DIAGNOSIS — Z5181 Encounter for therapeutic drug level monitoring: Secondary | ICD-10-CM

## 2013-07-25 DIAGNOSIS — I4891 Unspecified atrial fibrillation: Secondary | ICD-10-CM

## 2013-07-25 DIAGNOSIS — I6350 Cerebral infarction due to unspecified occlusion or stenosis of unspecified cerebral artery: Secondary | ICD-10-CM

## 2013-07-25 LAB — POCT INR: INR: 2.1

## 2013-07-31 ENCOUNTER — Ambulatory Visit: Payer: Self-pay | Admitting: Internal Medicine

## 2013-08-05 ENCOUNTER — Ambulatory Visit (INDEPENDENT_AMBULATORY_CARE_PROVIDER_SITE_OTHER): Payer: Medicare Other | Admitting: Pharmacist

## 2013-08-05 DIAGNOSIS — I6350 Cerebral infarction due to unspecified occlusion or stenosis of unspecified cerebral artery: Secondary | ICD-10-CM

## 2013-08-05 DIAGNOSIS — I4891 Unspecified atrial fibrillation: Secondary | ICD-10-CM

## 2013-08-05 DIAGNOSIS — Z5181 Encounter for therapeutic drug level monitoring: Secondary | ICD-10-CM

## 2013-08-05 LAB — POCT INR: INR: 2

## 2013-08-07 ENCOUNTER — Encounter: Payer: Self-pay | Admitting: Internal Medicine

## 2013-08-07 ENCOUNTER — Ambulatory Visit (INDEPENDENT_AMBULATORY_CARE_PROVIDER_SITE_OTHER): Payer: Medicare Other | Admitting: Internal Medicine

## 2013-08-07 VITALS — BP 120/64 | HR 60 | Temp 97.7°F | Resp 16 | Ht 62.0 in | Wt 122.4 lb

## 2013-08-07 DIAGNOSIS — Z79899 Other long term (current) drug therapy: Secondary | ICD-10-CM

## 2013-08-07 DIAGNOSIS — I10 Essential (primary) hypertension: Secondary | ICD-10-CM

## 2013-08-07 DIAGNOSIS — E785 Hyperlipidemia, unspecified: Secondary | ICD-10-CM

## 2013-08-07 DIAGNOSIS — E1129 Type 2 diabetes mellitus with other diabetic kidney complication: Secondary | ICD-10-CM

## 2013-08-07 DIAGNOSIS — E559 Vitamin D deficiency, unspecified: Secondary | ICD-10-CM

## 2013-08-07 LAB — BASIC METABOLIC PANEL WITH GFR
BUN: 13 mg/dL (ref 6–23)
CALCIUM: 10 mg/dL (ref 8.4–10.5)
CO2: 34 meq/L — AB (ref 19–32)
Chloride: 98 mEq/L (ref 96–112)
Creat: 0.8 mg/dL (ref 0.50–1.10)
GFR, EST AFRICAN AMERICAN: 77 mL/min
GFR, Est Non African American: 67 mL/min
Glucose, Bld: 126 mg/dL — ABNORMAL HIGH (ref 70–99)
Potassium: 3.6 mEq/L (ref 3.5–5.3)
SODIUM: 139 meq/L (ref 135–145)

## 2013-08-07 LAB — CBC WITH DIFFERENTIAL/PLATELET
BASOS PCT: 1 % (ref 0–1)
Basophils Absolute: 0.1 10*3/uL (ref 0.0–0.1)
Eosinophils Absolute: 0.1 10*3/uL (ref 0.0–0.7)
Eosinophils Relative: 2 % (ref 0–5)
HCT: 43.9 % (ref 36.0–46.0)
HEMOGLOBIN: 15 g/dL (ref 12.0–15.0)
LYMPHS ABS: 2.5 10*3/uL (ref 0.7–4.0)
Lymphocytes Relative: 38 % (ref 12–46)
MCH: 31.5 pg (ref 26.0–34.0)
MCHC: 34.2 g/dL (ref 30.0–36.0)
MCV: 92.2 fL (ref 78.0–100.0)
Monocytes Absolute: 0.7 10*3/uL (ref 0.1–1.0)
Monocytes Relative: 10 % (ref 3–12)
NEUTROS ABS: 3.2 10*3/uL (ref 1.7–7.7)
NEUTROS PCT: 49 % (ref 43–77)
Platelets: 209 10*3/uL (ref 150–400)
RBC: 4.76 MIL/uL (ref 3.87–5.11)
RDW: 13.4 % (ref 11.5–15.5)
WBC: 6.6 10*3/uL (ref 4.0–10.5)

## 2013-08-07 LAB — LIPID PANEL
CHOL/HDL RATIO: 2.5 ratio
Cholesterol: 118 mg/dL (ref 0–200)
HDL: 47 mg/dL (ref >39)
LDL Cholesterol: 50 mg/dL (ref 0–99)
Triglycerides: 107 mg/dL (ref <150)
VLDL: 21 mg/dL (ref 0–40)

## 2013-08-07 LAB — HEPATIC FUNCTION PANEL
ALT: 19 U/L (ref 0–35)
AST: 23 U/L (ref 0–37)
Albumin: 4.2 g/dL (ref 3.5–5.2)
Alkaline Phosphatase: 62 U/L (ref 39–117)
BILIRUBIN INDIRECT: 0.5 mg/dL (ref 0.2–1.2)
BILIRUBIN TOTAL: 0.7 mg/dL (ref 0.2–1.2)
Bilirubin, Direct: 0.2 mg/dL (ref 0.0–0.3)
Total Protein: 6.8 g/dL (ref 6.0–8.3)

## 2013-08-07 LAB — HEMOGLOBIN A1C
Hgb A1c MFr Bld: 6.5 % — ABNORMAL HIGH (ref <5.7)
Mean Plasma Glucose: 140 mg/dL — ABNORMAL HIGH (ref <117)

## 2013-08-07 LAB — TSH: TSH: 0.709 u[IU]/mL (ref 0.350–4.500)

## 2013-08-07 LAB — MAGNESIUM: MAGNESIUM: 1.8 mg/dL (ref 1.5–2.5)

## 2013-08-07 NOTE — Progress Notes (Signed)
Patient ID: Lisa Hopkins, female   DOB: 01/29/1925, 80 y.o.   MRN: [id number]    This very nice 80 y.o. MWF presents for 3 month follow up with Hypertension,Hx/o Embolic CVA associated with pAfib, Hyperlipidemia, GERD, Pre-Diabetes and Vitamin D Deficiency.    HTN predates since 821993. BP has been controlled at home. Today's BP: 120/64 mmHg.  She had a negative Heart Cath in 1992. Patient denies any cardiac type chest pain, palpitations, dyspnea/orthopnea/PND, dizziness, claudication, or dependent edema.   Hyperlipidemia is controlled with diet & meds. Last lipids at goal as below.  Patient denies myalgias or other med SE's.  Lab Results  Component Value Date   CHOL 163 04/30/2013   HDL 50 04/30/2013   LDLCALC 81 04/30/2013   TRIG 161* 04/30/2013   CHOLHDL 3.3 04/30/2013    Also, the patient has history of PreDiabetes with A1c 6.4% since 2010 and with last A1c of 6.5% in Dec 2014. Patient denies any symptoms of reactive hypoglycemia, diabetic polys, paresthesias or visual blurring.   Other problems include GERD controlled with diet and meds. Further, Patient has history of Vitamin D Deficiency of 35 in 2008 and last vitamin D was 3388 in July 2014. Patient supplements vitamin D without any suspected side-effects.  Medication Sig  . acetaminophen (TYLENOL) 325 MG tablet Take 650 mg by mouth every 4 (four) hours as needed.    Marland Kitchen. alum hydroxide-mag trisilicate (ANTACID) 80-20 MG CHEW Chew 1 tablet by mouth 3 (three) times daily as needed.  . Cholecalciferol (VITAMIN D) 2000 UNITS CAPS Take 1 capsule by mouth daily.   . hydrochlorothiazide (HYDRODIURIL) 25 MG tablet TAKE ONE TABLET BY MOUTH IN THE MORNING FOR BLOOD PRESSURE AND FLUID  . HYDROcodone-acetaminophen (NORCO/VICODIN) 5-325 MG per tablet Take 1-2 tablets by mouth every 4 (four) hours as needed.  . metoprolol (LOPRESSOR) 50 MG tablet TAKE ONE TABLET BY MOUTH TWICE DAILY  . pantoprazole (PROTONIX) 40 MG tablet Take 1 tablet (40 mg total) by  mouth daily.  . polyethylene glycol (MIRALAX / GLYCOLAX) packet Take 17 g by mouth daily.  . ranitidine (ZANTAC) 300 MG tablet TAKE ONE TABLET BY MOUTH TWICE DAILY  . rosuvastatin (CRESTOR) 20 MG tablet Take 1 tablet (20 mg total) by mouth every evening.  . warfarin (COUMADIN) 2 MG tablet TAKE AS DIRECTED BY ANTICOAGULATION CLINIC    Allergies  Allergen Reactions  . Ace Inhibitors     Cough  . Fosamax [Alendronate Sodium]     Heart burn  . Norvasc [Amlodipine Besylate]     edema  . Zocor [Simvastatin]     Elevated CPK    PMHx:   Past Medical History  Diagnosis Date  . Atrial fibrillation   . Aphasia as late effect of cerebrovascular accident   . CVA (cerebral infarction)   . Hyperlipidemia   . HTN (hypertension)   . Anxiety   . Diabetes mellitus type II   . Hiatal hernia   . GERD (gastroesophageal reflux disease)   . IBS (irritable bowel syndrome)   . Vitamin D deficiency     FHx:    Reviewed / unchanged  SHx:    Reviewed / unchanged  Systems Review: Constitutional: Denies fever, chills, wt changes, headaches, insomnia, fatigue, night sweats, change in appetite. Eyes: Denies redness, blurred vision, diplopia, discharge, itchy, watery eyes.  ENT: Denies discharge, congestion, post nasal drip, epistaxis, sore throat, earache, hearing loss, dental pain, tinnitus, vertigo, sinus pain, snoring.  CV: Denies chest pain, palpitations,  irregular heartbeat, syncope, dyspnea, diaphoresis, orthopnea, PND, claudication, edema. Respiratory: denies cough, dyspnea, DOE, pleurisy, hoarseness, laryngitis, wheezing.  Gastrointestinal: Denies dysphagia, odynophagia, heartburn, reflux, water brash, abdominal pain or cramps, nausea, vomiting, bloating, diarrhea, constipation, hematemesis, melena, hematochezia,  or hemorrhoids. Genitourinary: Denies dysuria, frequency, urgency, nocturia, hesitancy, discharge, hematuria, flank pain. Musculoskeletal: Denies arthralgias, myalgias, stiffness, jt.  swelling, pain, limp, strain/sprain.  Skin: Denies pruritus, rash, hives, warts, acne, eczema, change in skin lesion(s). Neuro: No weakness, tremor, incoordination, spasms, paresthesia, or pain. Psychiatric: Denies confusion, memory loss, or sensory loss. Endo: Denies change in weight, skin, hair change.  Heme/Lymph: No excessive bleeding, bruising, orenlarged lymph nodes.   Exam:  BP 120/64  Pulse 60  Temp(Src) 97.7 F (36.5 C) (Temporal)  Resp 16  Ht 5\' 2"  (1.575 m)  Wt 122 lb 6.4 oz (55.52 kg)  BMI 22.38 kg/m2  Appears well nourished - in no distress. Eyes: PERRLA, EOMs, conjunctiva no swelling or erythema. Sinuses: No frontal/maxillary tenderness ENT/Mouth: EAC's clear, TM's nl w/o erythema, bulging. Nares clear w/o erythema, swelling, exudates. Oropharynx clear without erythema or exudates. Oral hygiene is good. Tongue normal, non obstructing. Hearing intact.  Neck: Supple. Thyroid nl. Car 2+/2+ without bruits, nodes or JVD. Chest: Respirations nl with BS clear & equal w/o rales, rhonchi, wheezing or stridor.  Cor: Heart sounds normal w/ regular rate and rhythm without sig. murmurs, gallops, clicks, or rubs. Peripheral pulses normal and equal  without edema.  Abdomen: Soft & bowel sounds normal. Non-tender w/o guarding, rebound, hernias, masses, or organomegaly.  Lymphatics: Unremarkable.  Musculoskeletal: Full ROM all peripheral extremities, joint stability, 5/5 strength, and normal gait.  Skin: Warm, dry without exposed rashes, lesions, ecchymosis apparent.  Neuro: Cranial nerves intact, reflexes equal bilaterally. Sensory-motor testing grossly intact. Tendon reflexes grossly intact. Mild dysnomia and word search. Pysch: Alert & oriented x 3. Insight and judgement nl & appropriate. No ideations.  Assessment and Plan:  1. Hypertension - Continue monitor blood pressure at home. Continue diet/meds same.  2. Hyperlipidemia - Continue diet/meds, exercise,& lifestyle  modifications. Continue monitor periodic cholesterol/liver & renal functions   3. Pre-diabetes/Insulin Resistance - Continue diet, exercise, lifestyle modifications. Monitor appropriate labs.  4. Vitamin D Deficiency - Continue supplementation.  5. CVA  Recommended regular exercise, BP monitoring, weight control, and discussed med and SE's. Recommended labs to assess and monitor clinical status. Further disposition pending results of labs.

## 2013-08-07 NOTE — Patient Instructions (Signed)

## 2013-08-08 ENCOUNTER — Telehealth: Payer: Self-pay | Admitting: *Deleted

## 2013-08-08 LAB — INSULIN, FASTING: Insulin fasting, serum: 19 u[IU]/mL (ref 3–28)

## 2013-08-08 LAB — VITAMIN D 25 HYDROXY (VIT D DEFICIENCY, FRACTURES): Vit D, 25-Hydroxy: 100 ng/mL — ABNORMAL HIGH (ref 30–89)

## 2013-08-08 MED ORDER — FLUCONAZOLE 150 MG PO TABS: 150.0000 mg | ORAL_TABLET | ORAL | Status: DC

## 2013-08-08 NOTE — Telephone Encounter (Signed)
Spouse called and states patient's vaginal area red and irritated.  RX sent to  [facility] for Diflucan 150 mg per Dr Oneta RackMcKeown.

## 2013-08-22 ENCOUNTER — Ambulatory Visit (INDEPENDENT_AMBULATORY_CARE_PROVIDER_SITE_OTHER): Payer: Medicare Other | Admitting: Pharmacist

## 2013-08-22 DIAGNOSIS — I4891 Unspecified atrial fibrillation: Secondary | ICD-10-CM

## 2013-08-22 DIAGNOSIS — Z5181 Encounter for therapeutic drug level monitoring: Secondary | ICD-10-CM

## 2013-08-22 DIAGNOSIS — I6350 Cerebral infarction due to unspecified occlusion or stenosis of unspecified cerebral artery: Secondary | ICD-10-CM

## 2013-08-22 LAB — POCT INR: INR: 3.1

## 2013-08-28 ENCOUNTER — Other Ambulatory Visit: Payer: Self-pay | Admitting: Internal Medicine

## 2013-08-28 MED ORDER — TRIAMCINOLONE ACETONIDE 0.1 % EX CREA: 1.0000 "application " | TOPICAL_CREAM | Freq: Two times a day (BID) | CUTANEOUS | Status: DC

## 2013-09-12 ENCOUNTER — Ambulatory Visit (INDEPENDENT_AMBULATORY_CARE_PROVIDER_SITE_OTHER): Payer: Medicare Other | Admitting: *Deleted

## 2013-09-12 DIAGNOSIS — I4891 Unspecified atrial fibrillation: Secondary | ICD-10-CM

## 2013-09-12 DIAGNOSIS — I6350 Cerebral infarction due to unspecified occlusion or stenosis of unspecified cerebral artery: Secondary | ICD-10-CM

## 2013-09-12 DIAGNOSIS — Z5181 Encounter for therapeutic drug level monitoring: Secondary | ICD-10-CM

## 2013-09-12 LAB — POCT INR: INR: 1.5

## 2013-09-15 ENCOUNTER — Other Ambulatory Visit: Payer: Self-pay | Admitting: Internal Medicine

## 2013-09-26 ENCOUNTER — Ambulatory Visit (INDEPENDENT_AMBULATORY_CARE_PROVIDER_SITE_OTHER): Payer: Medicare Other

## 2013-09-26 DIAGNOSIS — I4891 Unspecified atrial fibrillation: Secondary | ICD-10-CM

## 2013-09-26 DIAGNOSIS — Z5181 Encounter for therapeutic drug level monitoring: Secondary | ICD-10-CM

## 2013-09-26 DIAGNOSIS — I6350 Cerebral infarction due to unspecified occlusion or stenosis of unspecified cerebral artery: Secondary | ICD-10-CM

## 2013-09-26 LAB — POCT INR: INR: 2.1

## 2013-10-10 ENCOUNTER — Ambulatory Visit (INDEPENDENT_AMBULATORY_CARE_PROVIDER_SITE_OTHER): Payer: Medicare Other

## 2013-10-10 DIAGNOSIS — I4891 Unspecified atrial fibrillation: Secondary | ICD-10-CM

## 2013-10-10 DIAGNOSIS — I6350 Cerebral infarction due to unspecified occlusion or stenosis of unspecified cerebral artery: Secondary | ICD-10-CM

## 2013-10-10 DIAGNOSIS — Z5181 Encounter for therapeutic drug level monitoring: Secondary | ICD-10-CM

## 2013-10-10 LAB — POCT INR: INR: 2

## 2013-10-27 ENCOUNTER — Other Ambulatory Visit: Payer: Self-pay | Admitting: Physician Assistant

## 2013-11-06 ENCOUNTER — Other Ambulatory Visit: Payer: Self-pay | Admitting: Physician Assistant

## 2013-11-06 ENCOUNTER — Ambulatory Visit (INDEPENDENT_AMBULATORY_CARE_PROVIDER_SITE_OTHER): Payer: Medicare Other

## 2013-11-06 DIAGNOSIS — Z5181 Encounter for therapeutic drug level monitoring: Secondary | ICD-10-CM

## 2013-11-06 DIAGNOSIS — I6350 Cerebral infarction due to unspecified occlusion or stenosis of unspecified cerebral artery: Secondary | ICD-10-CM

## 2013-11-06 DIAGNOSIS — I4891 Unspecified atrial fibrillation: Secondary | ICD-10-CM

## 2013-11-06 LAB — POCT INR: INR: 2.3

## 2013-11-11 ENCOUNTER — Encounter: Payer: Self-pay | Admitting: Emergency Medicine

## 2013-11-11 ENCOUNTER — Ambulatory Visit (INDEPENDENT_AMBULATORY_CARE_PROVIDER_SITE_OTHER): Payer: Medicare Other | Admitting: Emergency Medicine

## 2013-11-11 VITALS — BP 120/68 | HR 66 | Temp 98.2°F | Resp 16 | Ht 62.0 in | Wt 122.0 lb

## 2013-11-11 DIAGNOSIS — I10 Essential (primary) hypertension: Secondary | ICD-10-CM

## 2013-11-11 DIAGNOSIS — I4891 Unspecified atrial fibrillation: Secondary | ICD-10-CM

## 2013-11-11 DIAGNOSIS — K219 Gastro-esophageal reflux disease without esophagitis: Secondary | ICD-10-CM

## 2013-11-11 DIAGNOSIS — E782 Mixed hyperlipidemia: Secondary | ICD-10-CM

## 2013-11-11 DIAGNOSIS — R7309 Other abnormal glucose: Secondary | ICD-10-CM

## 2013-11-11 DIAGNOSIS — R002 Palpitations: Secondary | ICD-10-CM

## 2013-11-11 LAB — CBC WITH DIFFERENTIAL/PLATELET
Basophils Absolute: 0.1 10*3/uL (ref 0.0–0.1)
Basophils Relative: 1 % (ref 0–1)
Eosinophils Absolute: 0.1 10*3/uL (ref 0.0–0.7)
Eosinophils Relative: 2 % (ref 0–5)
HEMATOCRIT: 43.8 % (ref 36.0–46.0)
Hemoglobin: 14.9 g/dL (ref 12.0–15.0)
LYMPHS ABS: 2.8 10*3/uL (ref 0.7–4.0)
LYMPHS PCT: 39 % (ref 12–46)
MCH: 31.5 pg (ref 26.0–34.0)
MCHC: 34 g/dL (ref 30.0–36.0)
MCV: 92.6 fL (ref 78.0–100.0)
MONO ABS: 0.5 10*3/uL (ref 0.1–1.0)
MONOS PCT: 7 % (ref 3–12)
NEUTROS PCT: 51 % (ref 43–77)
Neutro Abs: 3.7 10*3/uL (ref 1.7–7.7)
Platelets: 203 10*3/uL (ref 150–400)
RBC: 4.73 MIL/uL (ref 3.87–5.11)
RDW: 13.5 % (ref 11.5–15.5)
WBC: 7.3 10*3/uL (ref 4.0–10.5)

## 2013-11-11 LAB — HEPATIC FUNCTION PANEL
ALBUMIN: 4 g/dL (ref 3.5–5.2)
ALT: 18 U/L (ref 0–35)
AST: 23 U/L (ref 0–37)
Alkaline Phosphatase: 61 U/L (ref 39–117)
Bilirubin, Direct: 0.2 mg/dL (ref 0.0–0.3)
Indirect Bilirubin: 0.6 mg/dL (ref 0.2–1.2)
TOTAL PROTEIN: 6.7 g/dL (ref 6.0–8.3)
Total Bilirubin: 0.8 mg/dL (ref 0.2–1.2)

## 2013-11-11 LAB — LIPID PANEL
Cholesterol: 119 mg/dL (ref 0–200)
HDL: 43 mg/dL (ref >39)
LDL Cholesterol: 56 mg/dL (ref 0–99)
TRIGLYCERIDES: 99 mg/dL (ref <150)
Total CHOL/HDL Ratio: 2.8 Ratio
VLDL: 20 mg/dL (ref 0–40)

## 2013-11-11 LAB — BASIC METABOLIC PANEL WITH GFR
BUN: 14 mg/dL (ref 6–23)
CO2: 31 meq/L (ref 19–32)
Calcium: 9.8 mg/dL (ref 8.4–10.5)
Chloride: 101 mEq/L (ref 96–112)
Creat: 0.91 mg/dL (ref 0.50–1.10)
GFR, Est African American: 66 mL/min
GFR, Est Non African American: 57 mL/min — ABNORMAL LOW
Glucose, Bld: 111 mg/dL — ABNORMAL HIGH (ref 70–99)
Potassium: 4.2 mEq/L (ref 3.5–5.3)
SODIUM: 140 meq/L (ref 135–145)

## 2013-11-11 NOTE — Progress Notes (Signed)
Subjective:    Patient ID: Lisa Hopkins, female    DOB: Jul 30, 1924, 80 y.o.   MRN: [id number]  HPI Comments: 80 yo WF presents for 3 month F/U for HTN, Cholesterol, Pre-Dm, D. Deficient. She is only 1/2 Crestor QHS. She has not been taking Protonix BID as directed and has noticed increased Reflux/ epigastric pressure. She notes she keeps active at home. She is not checking BP. She denies any CV complaints otherwise.  WBC             6.6   08/07/2013 HGB            15.0   08/07/2013 HCT            43.9   08/07/2013 PLT             209   08/07/2013 GLUCOSE         126   08/07/2013 CHOL            118   08/07/2013 TRIG            107   08/07/2013 HDL              47   08/07/2013 LDLCALC          50   08/07/2013 ALT              19   08/07/2013 AST              23   08/07/2013 NA              139   08/07/2013 K               3.6   08/07/2013 CL               98   08/07/2013 CREATININE     0.80   08/07/2013 BUN              13   08/07/2013 CO2              34   08/07/2013 TSH           0.709   08/07/2013 INR             2.3   11/06/2013 HGBA1C          6.5   08/07/2013   Hyperlipidemia  Gastrophageal Reflux     Medication List       This list is accurate as of: 11/11/13 11:59 PM.  Always use your most recent med list.               acetaminophen 325 MG tablet  Commonly known as:  TYLENOL  Take 650 mg by mouth every 4 (four) hours as needed.     hydrochlorothiazide 25 MG tablet  Commonly known as:  HYDRODIURIL  TAKE ONE TABLET BY MOUTH IN THE MORNING FOR BLOOD PRESSURE AND FLUID     HYDROcodone-acetaminophen 5-325 MG per tablet  Commonly known as:  NORCO/VICODIN  Take 1-2 tablets by mouth every 4 (four) hours as needed.     metoprolol 50 MG tablet  Commonly known as:  LOPRESSOR  TAKE ONE TABLET BY MOUTH TWICE DAILY     pantoprazole 40 MG tablet  Commonly known as:  PROTONIX  TAKE ONE TABLET BY MOUTH ONCE DAILY FOR  ACID  INDIGESTION     polyethylene glycol packet   Commonly known as:  MIRALAX / GLYCOLAX  Take 17  g by mouth daily.     ranitidine 300 MG tablet  Commonly known as:  ZANTAC  TAKE ONE TABLET BY MOUTH TWICE DAILY     rosuvastatin 20 MG tablet  Commonly known as:  CRESTOR  Take 1 tablet (20 mg total) by mouth every evening.     triamcinolone cream 0.1 %  Commonly known as:  KENALOG  Apply 1 application topically 2 (two) times daily. To skin rash     Vitamin D 2000 UNITS Caps  Take 1 capsule by mouth daily.     warfarin 2 MG tablet  Commonly known as:  COUMADIN  TAKE AS DIRECTED BY ANTICOAGULATION CLINIC       Allergies  Allergen Reactions  . Ace Inhibitors     Cough  . Fosamax [Alendronate Sodium]     Heart burn  . Norvasc [Amlodipine Besylate]     edema  . Zocor [Simvastatin]     Elevated CPK   Past Medical History  Diagnosis Date  . Atrial fibrillation   . Aphasia as late effect of cerebrovascular accident   . CVA (cerebral infarction)   . Hyperlipidemia   . HTN (hypertension)   . Anxiety   . Diabetes mellitus type II   . Hiatal hernia   . GERD (gastroesophageal reflux disease)   . IBS (irritable bowel syndrome)   . Vitamin D deficiency       Review of Systems  Gastrointestinal:       Genella RifeGerd  All other systems reviewed and are negative.  BP 120/68  Pulse 66  Temp(Src) 98.2 F (36.8 C) (Temporal)  Resp 16  Ht 5\' 2"  (1.575 m)  Wt 122 lb (55.339 kg)  BMI 22.31 kg/m2     Objective:   Physical Exam  Nursing note and vitals reviewed. Constitutional: She is oriented to person, place, and time. She appears well-developed and well-nourished. No distress.  HENT:  Head: Normocephalic and atraumatic.  Right Ear: External ear normal.  Left Ear: External ear normal.  Nose: Nose normal.  Mouth/Throat: Oropharynx is clear and moist.  Eyes: Conjunctivae and EOM are normal.  Neck: Normal range of motion. Neck supple. No JVD present. No thyromegaly present.  Cardiovascular: Normal rate, normal heart sounds  and intact distal pulses.   AFIB no change  Pulmonary/Chest: Effort normal and breath sounds normal.  Abdominal: Soft. Bowel sounds are normal. She exhibits no distension. There is no tenderness. There is no rebound.  Musculoskeletal: Normal range of motion. She exhibits no edema and no tenderness.  Lymphadenopathy:    She has no cervical adenopathy.  Neurological: She is alert and oriented to person, place, and time. No cranial nerve deficit.  Skin: Skin is warm and dry. No rash noted. No erythema. No pallor.  Psychiatric: She has a normal mood and affect. Her behavior is normal. Judgment and thought content normal.    EKG NSCSPT WNL       Assessment & Plan:  1.  3 month F/U for HTN, Cholesterol, Pre-Dm, D. Deficient. Needs healthy diet, cardio QD and obtain healthy weight. Check Labs, Check BP if >130/80 call office   2.? Palpitations- Patient asymptomatic, EKG stable will continue to monitor. ? If relfux contributing to epigastric pressure, w/c if SX increase or ER.   3.? GERD- Diet/ hygiene discussed, May try OTC Nexium and call with results, increase Protonix to BID

## 2013-11-11 NOTE — Patient Instructions (Addendum)
Diet for Gastroesophageal Reflux Disease, Adult Reflux is when stomach acid flows up into the esophagus. The esophagus becomes irritated and sore (inflammation). When reflux happens often and is severe, it is called gastroesophageal reflux disease (GERD). What you eat can help ease any discomfort caused by GERD. FOODS OR DRINKS TO AVOID OR LIMIT  Coffee and black tea, with or without caffeine.  Bubbly (carbonated) drinks with caffeine or energy drinks.  Strong spices, such as pepper, cayenne pepper, curry, or chili powder.  Peppermint or spearmint.  Chocolate.  High-fat foods, such as meats, fried food, oils, butter, or nuts.  Fruits and vegetables that cause discomfort. This includes citrus fruits and tomatoes.  Alcohol. If a certain food or drink irritates your GERD, avoid eating or drinking it. THINGS THAT MAY HELP GERD INCLUDE:  Eat meals slowly.  Eat 5 to 6 small meals a day, not 3 large meals.  Do not eat food for a certain amount of time if it causes discomfort.  Wait 3 hours after eating before lying down.  Keep the head of your bed raised 6 to 9 inches (15 23 centimeters). Put a foam wedge or blocks under the legs of the bed.  Stay active. Weight loss, if needed, may help ease your discomfort.  Wear loose-fitting clothing.  Do not smoke or chew tobacco. Document Released: 11/14/2011 Document Reviewed: 11/14/2011 [facility] CenterExitCare Patient Information 2014 [place], [place]. Palpitations  A palpitation is the feeling that your heartbeat is irregular. It may feel like your heart is fluttering or skipping a beat. It may also feel like your heart is beating faster than normal. This is usually not a serious problem. In some cases, you may need more medical tests. HOME CARE  Avoid:  Caffeine in coffee, tea, soft drinks, diet pills, and energy drinks.  Chocolate.  Alcohol.  Stop smoking if you smoke.  Reduce your stress and anxiety. Try:  A method that measures bodily  functions so you can learn to control them (biofeedback).  Yoga.  Meditation.  Physical activity such as swimming, jogging, or walking.  Get plenty of rest and sleep. GET HELP RIGHT AWAY IF:   You have chest pain.  You feel short of breath.  You have a very bad headache.  You feel dizzy or pass out (faint).  Your fast or irregular heartbeat continues after 24 hours.  Your palpitations occur more often. MAKE SURE YOU:   Understand these instructions.  Will watch your condition.  Will get help right away if you are not doing well or get worse. Document Released: 02/22/2008 Document Revised: 11/14/2011 Document Reviewed: 07/14/2011 [facility] Patient Information 2014 [place]Care, [place].

## 2013-11-12 LAB — HEMOGLOBIN A1C
HEMOGLOBIN A1C: 6.8 % — AB (ref <5.7)
MEAN PLASMA GLUCOSE: 148 mg/dL — AB (ref <117)

## 2013-11-12 LAB — TSH: TSH: 0.419 u[IU]/mL (ref 0.350–4.500)

## 2013-11-13 ENCOUNTER — Other Ambulatory Visit: Payer: Self-pay | Admitting: Emergency Medicine

## 2013-11-13 MED ORDER — HYDROCODONE-ACETAMINOPHEN 5-325 MG PO TABS: 1.0000 | ORAL_TABLET | ORAL | Status: DC | PRN

## 2013-11-18 ENCOUNTER — Other Ambulatory Visit: Payer: Self-pay | Admitting: Emergency Medicine

## 2013-11-18 ENCOUNTER — Other Ambulatory Visit: Payer: Self-pay

## 2013-11-18 MED ORDER — ROSUVASTATIN CALCIUM 20 MG PO TABS: 20.0000 mg | ORAL_TABLET | Freq: Every evening | ORAL | Status: DC

## 2013-11-18 MED ORDER — PANTOPRAZOLE SODIUM 40 MG PO TBEC: DELAYED_RELEASE_TABLET | ORAL | Status: DC

## 2013-12-02 ENCOUNTER — Other Ambulatory Visit: Payer: Self-pay | Admitting: Internal Medicine

## 2013-12-11 ENCOUNTER — Ambulatory Visit (INDEPENDENT_AMBULATORY_CARE_PROVIDER_SITE_OTHER): Payer: Medicare Other | Admitting: *Deleted

## 2013-12-11 DIAGNOSIS — I4891 Unspecified atrial fibrillation: Secondary | ICD-10-CM

## 2013-12-11 DIAGNOSIS — Z5181 Encounter for therapeutic drug level monitoring: Secondary | ICD-10-CM

## 2013-12-11 DIAGNOSIS — I6350 Cerebral infarction due to unspecified occlusion or stenosis of unspecified cerebral artery: Secondary | ICD-10-CM

## 2013-12-11 LAB — POCT INR: INR: 2.5

## 2013-12-15 ENCOUNTER — Other Ambulatory Visit: Payer: Self-pay | Admitting: Internal Medicine

## 2013-12-26 ENCOUNTER — Other Ambulatory Visit: Payer: Self-pay | Admitting: Cardiology

## 2013-12-30 ENCOUNTER — Other Ambulatory Visit: Payer: Self-pay | Admitting: Physician Assistant

## 2014-01-22 ENCOUNTER — Emergency Department (HOSPITAL_BASED_OUTPATIENT_CLINIC_OR_DEPARTMENT_OTHER): Payer: Medicare Other

## 2014-01-22 ENCOUNTER — Encounter (HOSPITAL_BASED_OUTPATIENT_CLINIC_OR_DEPARTMENT_OTHER): Payer: Self-pay | Admitting: Emergency Medicine

## 2014-01-22 ENCOUNTER — Emergency Department (HOSPITAL_BASED_OUTPATIENT_CLINIC_OR_DEPARTMENT_OTHER)
Admission: EM | Admit: 2014-01-22 | Discharge: 2014-01-22 | Disposition: A | Discharge status: Home / Self Care | Payer: Medicare Other | Attending: Emergency Medicine | Admitting: Emergency Medicine

## 2014-01-22 DIAGNOSIS — W010XXA Fall on same level from slipping, tripping and stumbling without subsequent striking against object, initial encounter: Secondary | ICD-10-CM | POA: Insufficient documentation

## 2014-01-22 DIAGNOSIS — E785 Hyperlipidemia, unspecified: Secondary | ICD-10-CM | POA: Diagnosis not present

## 2014-01-22 DIAGNOSIS — S7000XA Contusion of unspecified hip, initial encounter: Secondary | ICD-10-CM | POA: Diagnosis not present

## 2014-01-22 DIAGNOSIS — S79929A Unspecified injury of unspecified thigh, initial encounter: Secondary | ICD-10-CM

## 2014-01-22 DIAGNOSIS — Y929 Unspecified place or not applicable: Secondary | ICD-10-CM | POA: Insufficient documentation

## 2014-01-22 DIAGNOSIS — E119 Type 2 diabetes mellitus without complications: Secondary | ICD-10-CM | POA: Diagnosis not present

## 2014-01-22 DIAGNOSIS — Z8659 Personal history of other mental and behavioral disorders: Secondary | ICD-10-CM | POA: Insufficient documentation

## 2014-01-22 DIAGNOSIS — IMO0002 Reserved for concepts with insufficient information to code with codable children: Secondary | ICD-10-CM | POA: Insufficient documentation

## 2014-01-22 DIAGNOSIS — S0993XA Unspecified injury of face, initial encounter: Secondary | ICD-10-CM | POA: Insufficient documentation

## 2014-01-22 DIAGNOSIS — S0990XA Unspecified injury of head, initial encounter: Secondary | ICD-10-CM | POA: Diagnosis not present

## 2014-01-22 DIAGNOSIS — K219 Gastro-esophageal reflux disease without esophagitis: Secondary | ICD-10-CM | POA: Diagnosis not present

## 2014-01-22 DIAGNOSIS — Z8673 Personal history of transient ischemic attack (TIA), and cerebral infarction without residual deficits: Secondary | ICD-10-CM | POA: Insufficient documentation

## 2014-01-22 DIAGNOSIS — S79919A Unspecified injury of unspecified hip, initial encounter: Secondary | ICD-10-CM | POA: Diagnosis present

## 2014-01-22 DIAGNOSIS — I10 Essential (primary) hypertension: Secondary | ICD-10-CM | POA: Insufficient documentation

## 2014-01-22 DIAGNOSIS — Z7901 Long term (current) use of anticoagulants: Secondary | ICD-10-CM | POA: Insufficient documentation

## 2014-01-22 DIAGNOSIS — W1809XA Striking against other object with subsequent fall, initial encounter: Secondary | ICD-10-CM | POA: Insufficient documentation

## 2014-01-22 DIAGNOSIS — I4891 Unspecified atrial fibrillation: Secondary | ICD-10-CM | POA: Diagnosis not present

## 2014-01-22 DIAGNOSIS — Z79899 Other long term (current) drug therapy: Secondary | ICD-10-CM | POA: Insufficient documentation

## 2014-01-22 DIAGNOSIS — S7002XA Contusion of left hip, initial encounter: Secondary | ICD-10-CM

## 2014-01-22 DIAGNOSIS — Y9389 Activity, other specified: Secondary | ICD-10-CM | POA: Diagnosis not present

## 2014-01-22 DIAGNOSIS — S199XXA Unspecified injury of neck, initial encounter: Secondary | ICD-10-CM

## 2014-01-22 HISTORY — DX: Cerebral infarction, unspecified: I63.9

## 2014-01-22 NOTE — ED Notes (Signed)
Pt sts she slipped and fell 1 week ago and has had right hip pain since. She sts the pain is worse when she walks. No bruising noted.

## 2014-01-22 NOTE — Discharge Instructions (Signed)
Contusion °A contusion is a deep bruise. Contusions are the result of an injury that caused bleeding under the skin. The contusion may turn blue, purple, or yellow. Minor injuries will give you a painless contusion, but more severe contusions may stay painful and swollen for a few weeks.  °CAUSES  °A contusion is usually caused by a blow, trauma, or direct force to an area of the body. °SYMPTOMS  °· Swelling and redness of the injured area. °· Bruising of the injured area. °· Tenderness and soreness of the injured area. °· Pain. °DIAGNOSIS  °The diagnosis can be made by taking a history and physical exam. An X-ray, CT scan, or MRI may be needed to determine if there were any associated injuries, such as fractures. °TREATMENT  °Specific treatment will depend on what area of the body was injured. In general, the best treatment for a contusion is resting, icing, elevating, and applying cold compresses to the injured area. Over-the-counter medicines may also be recommended for pain control. Ask your caregiver what the best treatment is for your contusion. °HOME CARE INSTRUCTIONS  °· Put ice on the injured area. °¨ Put ice in a plastic bag. °¨ Place a towel between your skin and the bag. °¨ Leave the ice on for 15-20 minutes, 3-4 times a day, or as directed by your health care provider. °· Only take over-the-counter or prescription medicines for pain, discomfort, or fever as directed by your caregiver. Your caregiver may recommend avoiding anti-inflammatory medicines (aspirin, ibuprofen, and naproxen) for 48 hours because these medicines may increase bruising. °· Rest the injured area. °· If possible, elevate the injured area to reduce swelling. °SEEK IMMEDIATE MEDICAL CARE IF:  °· You have increased bruising or swelling. °· You have pain that is getting worse. °· Your swelling or pain is not relieved with medicines. °MAKE SURE YOU:  °· Understand these instructions. °· Will watch your condition. °· Will get help right  away if you are not doing well or get worse. °Document Released: 02/22/2005 Document Revised: 05/20/2013 Document Reviewed: 03/20/2011 °ExitCare® Patient Information ©2015 [employer] LLC. This information is not intended to replace advice given to you by your health care provider. Make sure you discuss any questions you have with your health care provider. ° °

## 2014-01-22 NOTE — ED Provider Notes (Signed)
CSN: [id number]     Arrival date & time 01/22/14  1334 History   First MD Initiated Contact with Patient 01/22/14 1351     Chief Complaint  Patient presents with  . Hip Pain     (Consider location/radiation/quality/duration/timing/severity/associated sxs/prior Treatment) HPI Comments: Patient presents with right hip pain. She states about a week ago she tripped and fell over a table. She's had some constant throbbing pain in her left hip since then. It's worse with ambulation. She does state that she hit her head when she fell and she's had some intermittent headaches since then. She also has some pain in her lower neck. She denies any numbness or weakness in her extremities. She denies any other injuries from the fall. Of note she is on Coumadin.  Patient is a 80 y.o. female presenting with hip pain.  Hip Pain Associated symptoms include headaches. Pertinent negatives include no chest pain, no abdominal pain and no shortness of breath.    Past Medical History  Diagnosis Date  . Atrial fibrillation   . Aphasia as late effect of cerebrovascular accident   . CVA (cerebral infarction)   . Hyperlipidemia   . HTN (hypertension)   . Anxiety   . Diabetes mellitus type II   . Hiatal hernia   . GERD (gastroesophageal reflux disease)   . IBS (irritable bowel syndrome)   . Vitamin D deficiency   . Stroke    Past Surgical History  Procedure Laterality Date  . Upper gastrointestinal endoscopy  2005  . Colonoscopy  2005  . Cholecystectomy    . Cataract extraction, bilateral    . Tonsillectomy     Family History  Problem Relation Age of Onset  . Diabetes Mother   . Stroke Mother   . Hypertension Brother   . Heart disease Brother   . Arthritis Sister     Rhematoid  . Arthritis Sister     Rhematoid   History  Substance Use Topics  . Smoking status: Never Smoker   . Smokeless tobacco: Never Used  . Alcohol Use: No   OB History   Grav Para Term Preterm Abortions TAB SAB Ect  Mult Living                 Review of Systems  Constitutional: Negative for fever, chills, diaphoresis and fatigue.  HENT: Negative for congestion, rhinorrhea and sneezing.   Eyes: Negative.   Respiratory: Negative for cough, chest tightness and shortness of breath.   Cardiovascular: Negative for chest pain and leg swelling.  Gastrointestinal: Negative for nausea, vomiting, abdominal pain, diarrhea and blood in stool.  Genitourinary: Negative for frequency, hematuria, flank pain and difficulty urinating.  Musculoskeletal: Positive for arthralgias and neck pain. Negative for back pain.  Skin: Negative for rash.  Neurological: Positive for headaches. Negative for dizziness, speech difficulty, weakness and numbness.      Allergies  Ace inhibitors; Fosamax; Norvasc; and Zocor  Home Medications   Prior to Admission medications   Medication Sig Start Date End Date Taking? Authorizing Provider  acetaminophen (TYLENOL) 325 MG tablet Take 650 mg by mouth every 4 (four) hours as needed.      Historical Provider, MD  Cholecalciferol (VITAMIN D) 2000 UNITS CAPS Take 1 capsule by mouth daily.     Historical Provider, MD  hydrochlorothiazide (HYDRODIURIL) 25 MG tablet TAKE ONE TABLET BY MOUTH IN THE MORNING FOR BLOOD PRESSURE AND FLUID 12/02/13   Quentin Mulling, PA-C  HYDROcodone-acetaminophen (NORCO/VICODIN) 5-325 MG per tablet  Take 1-2 tablets by mouth every 4 (four) hours as needed. 11/13/13   Melissa R Smith, PA-C  metoprolol (LOPRESSOR) 50 MG tablet TAKE ONE TABLET BY MOUTH TWICE DAILY 10/27/13   Lucky Cowboy, MD  pantoprazole (PROTONIX) 40 MG tablet TAKE ONE TABLET BY MOUTH ONCE DAILY FOR  ACID  INDIGESTION 11/18/13   Melissa R Smith, PA-C  pantoprazole (PROTONIX) 40 MG tablet TAKE ONE TABLET BY MOUTH TWICE DAILY FOR ACID INDIGESTION 12/15/13   Melissa R Smith, PA-C  polyethylene glycol (MIRALAX / GLYCOLAX) packet Take 17 g by mouth daily. 03/17/12   Dorothea Ogle, MD  ranitidine (ZANTAC) 300  MG tablet TAKE ONE TABLET BY MOUTH TWICE DAILY 12/30/13   Lucky Cowboy, MD  rosuvastatin (CRESTOR) 20 MG tablet Take 1 tablet (20 mg total) by mouth every evening. 11/18/13   Quentin Mulling, PA-C  triamcinolone cream (KENALOG) 0.1 % Apply 1 application topically 2 (two) times daily. To skin rash 08/28/13   Lucky Cowboy, MD  warfarin (COUMADIN) 2 MG tablet TAKE AS DIRECTED BY ANTICOAGULATION CLINIC 12/26/13   Lars Masson, MD   BP 146/75  Pulse 65  Temp(Src) 97.6 F (36.4 C) (Oral)  Resp 18  Ht  (1.6 m)  Wt 120 lb (54.432 kg)  BMI 21.26 kg/m2  SpO2 95% Physical Exam  Constitutional: She is oriented to person, place, and time. She appears well-developed and well-nourished.  HENT:  Head: Normocephalic and atraumatic.  Eyes: Pupils are equal, round, and reactive to light.  Neck: Normal range of motion. Neck supple.  Mild tenderness to the lower cervical spine. No pain to the thoracic or lumbosacral spine. No step-offs or deformities.  Cardiovascular: Normal rate, regular rhythm and normal heart sounds.   Pulmonary/Chest: Effort normal and breath sounds normal. No respiratory distress. She has no wheezes. She has no rales. She exhibits no tenderness.  Abdominal: Soft. Bowel sounds are normal. There is no tenderness. There is no rebound and no guarding.  Musculoskeletal: Normal range of motion. She exhibits no edema.  Mild pain or range of motion the right hip. There is no pain on palpation of the pelvis. She's neurovascularly intact distally. There is no pain to the knee or ankle. There is no other pain on palpation or range of motion extremities  Lymphadenopathy:    She has no cervical adenopathy.  Neurological: She is alert and oriented to person, place, and time.  Skin: Skin is warm and dry. No rash noted.  Psychiatric: She has a normal mood and affect.    ED Course  Procedures (including critical care time) Labs Review Labs Reviewed - No data to display  Imaging  Review Dg Hip Complete Right  01/22/2014   CLINICAL DATA:  Pain post trauma  EXAM: RIGHT HIP - COMPLETE 2+ VIEW  COMPARISON:  None.  FINDINGS: Frontal pelvis as well as frontal and lateral right hip images were obtained. There is no fracture or dislocation. Hip joint spaces appear normal bilaterally. There is osteoarthritic change in the pubic symphysis region. No erosive change.  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 Question prior trauma in this area. Hip joints appear symmetric and within normal limits bilaterally. No acute fracture or dislocation.   Electronically Signed   By: Bretta Bang M.D.   On: 01/22/2014 14:34   Ct Head Wo Contrast  01/22/2014   CLINICAL DATA:  Status post fall. Posterior neck pain. Facial pain.  EXAM: CT HEAD WITHOUT CONTRAST  CT CERVICAL SPINE WITHOUT CONTRAST  TECHNIQUE: Multidetector CT imaging of the head and cervical spine was performed following the standard protocol without intravenous contrast. Multiplanar CT image reconstructions of the cervical spine were also generated.  COMPARISON:  CT head and cervical spine 02/21/2010.  FINDINGS: CT HEAD FINDINGS  Cortical atrophy and chronic microvascular ischemic change are again seen. Remote right cerebellar and left MCA infarct involving the anterior sylvian division are again seen. There is no evidence of acute infarction, hemorrhage, mass lesion, mass effect, midline shift or abnormal extra-axial fluid collection. No hydrocephalus or pneumocephalus. The calvarium is intact. Imaged paranasal sinuses and mastoid air cells are clear.  CT CERVICAL SPINE FINDINGS  No fracture or malalignment of the cervical spine is identified. Loss of disc space height and endplate spurring are worst at C4-5, C5-6 and C6-7. There is some scarring in the lung apices, unchanged. Low attenuating lesion in the right lobe of the thyroid measuring 0.9 cm is incidentally noted.  IMPRESSION: No acute finding head or cervical spine.    Electronically Signed   By: Drusilla Kanner M.D.   On: 01/22/2014 14:41   Ct Cervical Spine Wo Contrast  01/22/2014   CLINICAL DATA:  Status post fall. Posterior neck pain. Facial pain.  EXAM: CT HEAD WITHOUT CONTRAST  CT CERVICAL SPINE WITHOUT CONTRAST  TECHNIQUE: Multidetector CT imaging of the head and cervical spine was performed following the standard protocol without intravenous contrast. Multiplanar CT image reconstructions of the cervical spine were also generated.  COMPARISON:  CT head and cervical spine 02/21/2010.  FINDINGS: CT HEAD FINDINGS  Cortical atrophy and chronic microvascular ischemic change are again seen. Remote right cerebellar and left MCA infarct involving the anterior sylvian division are again seen. There is no evidence of acute infarction, hemorrhage, mass lesion, mass effect, midline shift or abnormal extra-axial fluid collection. No hydrocephalus or pneumocephalus. The calvarium is intact. Imaged paranasal sinuses and mastoid air cells are clear.  CT CERVICAL SPINE FINDINGS  No fracture or malalignment of the cervical spine is identified. Loss of disc space height and endplate spurring are worst at C4-5, C5-6 and C6-7. There is some scarring in the lung apices, unchanged. Low attenuating lesion in the right lobe of the thyroid measuring 0.9 cm is incidentally noted.  IMPRESSION: No acute finding head or cervical spine.   Electronically Signed   By: Drusilla Kanner M.D.   On: 01/22/2014 14:41     EKG Interpretation None      MDM   Final diagnoses:  Contusion, hip, left, initial encounter    Patient presents after a fall. I did a head CT given that she did hit her head and she's on Coumadin. Her head CT is negative. X-rays of her cervical spine did not show any evidence of injuries. She does have a lesion in the thyroid which I did advise the patient and her primary care physician can keep an eye on this. She is able to ambulate in the ED. There is no evidence of  fracture seen on x-rays. She was discharged home in good condition. She was encouraged to followup with her primary care physician about a week to reassess the pain. She has hydrocodone at home to use for pain, therefore she is not requesting any new pain medications.    Rolan Bucco, MD 01/22/14 [id number]

## 2014-01-22 NOTE — ED Notes (Signed)
Pt ambulated unassisted and with steady gait around exam room. Husband sts pt is ambulating mostly per her norm but he can tell it hurts her to walk.

## 2014-01-23 ENCOUNTER — Ambulatory Visit (INDEPENDENT_AMBULATORY_CARE_PROVIDER_SITE_OTHER): Payer: Medicare Other

## 2014-01-23 DIAGNOSIS — Z5181 Encounter for therapeutic drug level monitoring: Secondary | ICD-10-CM

## 2014-01-23 DIAGNOSIS — I4891 Unspecified atrial fibrillation: Secondary | ICD-10-CM

## 2014-01-23 DIAGNOSIS — I6350 Cerebral infarction due to unspecified occlusion or stenosis of unspecified cerebral artery: Secondary | ICD-10-CM

## 2014-01-23 LAB — POCT INR: INR: 2.7

## 2014-01-31 ENCOUNTER — Other Ambulatory Visit: Payer: Self-pay | Admitting: Internal Medicine

## 2014-02-17 ENCOUNTER — Ambulatory Visit (INDEPENDENT_AMBULATORY_CARE_PROVIDER_SITE_OTHER): Payer: Medicare Other | Admitting: Internal Medicine

## 2014-02-17 ENCOUNTER — Encounter: Payer: Self-pay | Admitting: Internal Medicine

## 2014-02-17 VITALS — BP 122/74 | HR 70 | Temp 98.6°F | Resp 16 | Ht 62.0 in | Wt 123.0 lb

## 2014-02-17 DIAGNOSIS — Z1331 Encounter for screening for depression: Secondary | ICD-10-CM

## 2014-02-17 DIAGNOSIS — E559 Vitamin D deficiency, unspecified: Secondary | ICD-10-CM

## 2014-02-17 DIAGNOSIS — I6350 Cerebral infarction due to unspecified occlusion or stenosis of unspecified cerebral artery: Secondary | ICD-10-CM

## 2014-02-17 DIAGNOSIS — E785 Hyperlipidemia, unspecified: Secondary | ICD-10-CM

## 2014-02-17 DIAGNOSIS — E1129 Type 2 diabetes mellitus with other diabetic kidney complication: Secondary | ICD-10-CM

## 2014-02-17 DIAGNOSIS — Z0000 Encounter for general adult medical examination without abnormal findings: Secondary | ICD-10-CM

## 2014-02-17 DIAGNOSIS — Z79899 Other long term (current) drug therapy: Secondary | ICD-10-CM

## 2014-02-17 DIAGNOSIS — I482 Chronic atrial fibrillation, unspecified: Secondary | ICD-10-CM

## 2014-02-17 DIAGNOSIS — Z789 Other specified health status: Secondary | ICD-10-CM

## 2014-02-17 DIAGNOSIS — I10 Essential (primary) hypertension: Secondary | ICD-10-CM

## 2014-02-17 DIAGNOSIS — Z1212 Encounter for screening for malignant neoplasm of rectum: Secondary | ICD-10-CM

## 2014-02-17 LAB — CBC WITH DIFFERENTIAL/PLATELET
BASOS ABS: 0.1 10*3/uL (ref 0.0–0.1)
BASOS PCT: 1 % (ref 0–1)
Eosinophils Absolute: 0.1 10*3/uL (ref 0.0–0.7)
Eosinophils Relative: 1 % (ref 0–5)
HEMATOCRIT: 42.9 % (ref 36.0–46.0)
Hemoglobin: 14.8 g/dL (ref 12.0–15.0)
LYMPHS PCT: 40 % (ref 12–46)
Lymphs Abs: 3 10*3/uL (ref 0.7–4.0)
MCH: 31.8 pg (ref 26.0–34.0)
MCHC: 34.5 g/dL (ref 30.0–36.0)
MCV: 92.1 fL (ref 78.0–100.0)
MONO ABS: 0.6 10*3/uL (ref 0.1–1.0)
Monocytes Relative: 8 % (ref 3–12)
NEUTROS ABS: 3.8 10*3/uL (ref 1.7–7.7)
NEUTROS PCT: 50 % (ref 43–77)
Platelets: 190 10*3/uL (ref 150–400)
RBC: 4.66 MIL/uL (ref 3.87–5.11)
RDW: 13.6 % (ref 11.5–15.5)
WBC: 7.6 10*3/uL (ref 4.0–10.5)

## 2014-02-17 NOTE — Patient Instructions (Signed)
Recommend the book "The END of DIETING" by Dr Baker Janus   and the book "The END of DIABETES " by Dr Excell Seltzer  At [email or web address] - get book & Audio CD's      Being diabetic has a  300% increased risk for heart attack, stroke, cancer, and alzheimer- type vascular dementia. It is very important that you work harder with diet by avoiding all foods that are white except chicken & fish. Avoid white rice (brown & wild rice is OK), white potatoes (sweetpotatoes in moderation is OK), White bread or wheat bread or anything made out of white flour like bagels, donuts, rolls, buns, biscuits, cakes, pastries, cookies, pizza crust, and pasta (made from white flour & egg whites) - vegetarian pasta or spinach or wheat pasta is OK. Multigrain breads like Arnold's or [employer], or multigrain sandwich thins or flatbreads.  Diet, exercise and weight loss can reverse and cure diabetes in the early stages.  Diet, exercise and weight loss is very important in the control and prevention of complications of diabetes which affects every system in your body, ie. Brain - dementia/stroke, eyes - glaucoma/blindness, heart - heart attack/heart failure, kidneys - dialysis, stomach - gastric paralysis, intestines - malabsorption, nerves - severe painful neuritis, circulation - gangrene & loss of a leg(s), and finally cancer and Alzheimers.    I recommend avoid fried & greasy foods,  sweets/candy, white rice (brown or wild rice or Quinoa is OK), white potatoes (sweet potatoes are OK) - anything made from white flour - bagels, doughnuts, rolls, buns, biscuits,white and wheat breads, pizza crust and traditional pasta made of white flour & egg white(vegetarian pasta or spinach or wheat pasta is OK).  Multi-grain bread is OK - like multi-grain flat bread or sandwich thins. Avoid alcohol in excess. Exercise is also important.    Eat all the vegetables you want - avoid meat, especially red meat and dairy - especially cheese.  Cheese  is the most concentrated form of trans-fats which is the worst thing to clog up our arteries. Veggie cheese is OK which can be found in the fresh produce section at [employer] or [employer] or [employer]  Preventive Care for Adults A healthy lifestyle and preventive care can promote health and wellness. Preventive health guidelines for women include the following key practices.  A routine yearly physical is a good way to check with your health care provider about your health and preventive screening. It is a chance to share any concerns and updates on your health and to receive a thorough exam.  Visit your dentist for a routine exam and preventive care every 6 months. Brush your teeth twice a day and floss once a day. Good oral hygiene prevents tooth decay and gum disease.  The frequency of eye exams is based on your age, health, family medical history, use of contact lenses, and other factors. Follow your health care provider's recommendations for frequency of eye exams.  Eat a healthy diet. Foods like vegetables, fruits, whole grains, low-fat dairy products, and lean protein foods contain the nutrients you need without too many calories. Decrease your intake of foods high in solid fats, added sugars, and salt. Eat the right amount of calories for you.Get information about a proper diet from your health care provider, if necessary.  Regular physical exercise is one of the most important things you can do for your health. Most adults should get at least 150 minutes of moderate-intensity exercise (any activity that increases  your heart rate and causes you to sweat) each week. In addition, most adults need muscle-strengthening exercises on 2 or more days a week.  Maintain a healthy weight. The body mass index (BMI) is a screening tool to identify possible weight problems. It provides an estimate of body fat based on height and weight. Your health care provider can find your BMI and can help you  achieve or maintain a healthy weight.For adults 20 years and older:  A BMI below 18.5 is considered underweight.  A BMI of 18.5 to 24.9 is normal.  A BMI of 25 to 29.9 is considered overweight.  A BMI of 30 and above is considered obese.  Maintain normal blood lipids and cholesterol levels by exercising and minimizing your intake of saturated fat. Eat a balanced diet with plenty of fruit and vegetables. Blood tests for lipids and cholesterol should begin at age 43 and be repeated every 5 years. If your lipid or cholesterol levels are high, you are over 50, or you are at high risk for heart disease, you may need your cholesterol levels checked more frequently.Ongoing high lipid and cholesterol levels should be treated with medicines if diet and exercise are not working.  If you smoke, find out from your health care provider how to quit. If you do not use tobacco, do not start.  Lung cancer screening is recommended for adults aged 40-80 years who are at high risk for developing lung cancer because of a history of smoking. A yearly low-dose CT scan of the lungs is recommended for people who have at least a 30-pack-year history of smoking and are a current smoker or have quit within the past 15 years. A pack year of smoking is smoking an average of 1 pack of cigarettes a day for 1 year (for example: 1 pack a day for 30 years or 2 packs a day for 15 years). Yearly screening should continue until the smoker has stopped smoking for at least 15 years. Yearly screening should be stopped for people who develop a health problem that would prevent them from having lung cancer treatment.  If you are pregnant, do not drink alcohol. If you are breastfeeding, be very cautious about drinking alcohol. If you are not pregnant and choose to drink alcohol, do not have more than 1 drink per day. One drink is considered to be 12 ounces (355 mL) of beer, 5 ounces (148 mL) of wine, or 1.5 ounces (44 mL) of liquor.  Avoid  use of street drugs. Do not share needles with anyone. Ask for help if you need support or instructions about stopping the use of drugs.  High blood pressure causes heart disease and increases the risk of stroke. Your blood pressure should be checked at least every 1 to 2 years. Ongoing high blood pressure should be treated with medicines if weight loss and exercise do not work.  If you are 74-43 years old, ask your health care provider if you should take aspirin to prevent strokes.  Diabetes screening involves taking a blood sample to check your fasting blood sugar level. This should be done once every 3 years, after age 105, if you are within normal weight and without risk factors for diabetes. Testing should be considered at a younger age or be carried out more frequently if you are overweight and have at least 1 risk factor for diabetes.  Breast cancer screening is essential preventive care for women. You should practice "breast self-awareness." This means understanding the  normal appearance and feel of your breasts and may include breast self-examination. Any changes detected, no matter how small, should be reported to a health care provider. Women in their 77s and 30s should have a clinical breast exam (CBE) by a health care provider as part of a regular health exam every 1 to 3 years. After age 52, women should have a CBE every year. Starting at age 59, women should consider having a mammogram (breast X-ray test) every year. Women who have a family history of breast cancer should talk to their health care provider about genetic screening. Women at a high risk of breast cancer should talk to their health care providers about having an MRI and a mammogram every year.  Breast cancer gene (BRCA)-related cancer risk assessment is recommended for women who have family members with BRCA-related cancers. BRCA-related cancers include breast, ovarian, tubal, and peritoneal cancers. Having family members with  these cancers may be associated with an increased risk for harmful changes (mutations) in the breast cancer genes BRCA1 and BRCA2. Results of the assessment will determine the need for genetic counseling and BRCA1 and BRCA2 testing.  Routine pelvic exams to screen for cancer are no longer recommended for nonpregnant women who are considered low risk for cancer of the pelvic organs (ovaries, uterus, and vagina) and who do not have symptoms. Ask your health care provider if a screening pelvic exam is right for you.  If you have had past treatment for cervical cancer or a condition that could lead to cancer, you need Pap tests and screening for cancer for at least 20 years after your treatment. If Pap tests have been discontinued, your risk factors (such as having a new sexual partner) need to be reassessed to determine if screening should be resumed. Some women have medical problems that increase the chance of getting cervical cancer. In these cases, your health care provider may recommend more frequent screening and Pap tests.  The HPV test is an additional test that may be used for cervical cancer screening. The HPV test looks for the virus that can cause the cell changes on the cervix. The cells collected during the Pap test can be tested for HPV. The HPV test could be used to screen women aged 43 years and older, and should be used in women of any age who have unclear Pap test results. After the age of 50, women should have HPV testing at the same frequency as a Pap test.  Colorectal cancer can be detected and often prevented. Most routine colorectal cancer screening begins at the age of 30 years and continues through age 66 years. However, your health care provider may recommend screening at an earlier age if you have risk factors for colon cancer. On a yearly basis, your health care provider may provide home test kits to check for hidden blood in the stool. Use of a small camera at the end of a tube, to  directly examine the colon (sigmoidoscopy or colonoscopy), can detect the earliest forms of colorectal cancer. Talk to your health care provider about this at age 43, when routine screening begins. Direct exam of the colon should be repeated every 5-10 years through age 56 years, unless early forms of pre-cancerous polyps or small growths are found.  People who are at an increased risk for hepatitis B should be screened for this virus. You are considered at high risk for hepatitis B if:  You were born in a country where hepatitis B occurs  often. Talk with your health care provider about which countries are considered high risk.  Your parents were born in a high-risk country and you have not received a shot to protect against hepatitis B (hepatitis B vaccine).  You have HIV or AIDS.  You use needles to inject street drugs.  You live with, or have sex with, someone who has hepatitis B.  You get hemodialysis treatment.  You take certain medicines for conditions like cancer, organ transplantation, and autoimmune conditions.  Hepatitis C blood testing is recommended for all people born from [birth year] through 1965 and any individual with known risks for hepatitis C.  Practice safe sex. Use condoms and avoid high-risk sexual practices to reduce the spread of sexually transmitted infections (STIs). STIs include gonorrhea, chlamydia, syphilis, trichomonas, herpes, HPV, and human immunodeficiency virus (HIV). Herpes, HIV, and HPV are viral illnesses that have no cure. They can result in disability, cancer, and death.  You should be screened for sexually transmitted illnesses (STIs) including gonorrhea and chlamydia if:  You are sexually active and are younger than 24 years.  You are older than 24 years and your health care provider tells you that you are at risk for this type of infection.  Your sexual activity has changed since you were last screened and you are at an increased risk for chlamydia or  gonorrhea. Ask your health care provider if you are at risk.  If you are at risk of being infected with HIV, it is recommended that you take a prescription medicine daily to prevent HIV infection. This is called preexposure prophylaxis (PrEP). You are considered at risk if:  You are a heterosexual woman, are sexually active, and are at increased risk for HIV infection.  You take drugs by injection.  You are sexually active with a partner who has HIV.  Talk with your health care provider about whether you are at high risk of being infected with HIV. If you choose to begin PrEP, you should first be tested for HIV. You should then be tested every 3 months for as long as you are taking PrEP.  Osteoporosis is a disease in which the bones lose minerals and strength with aging. This can result in serious bone fractures or breaks. The risk of osteoporosis can be identified using a bone density scan. Women ages 35 years and over and women at risk for fractures or osteoporosis should discuss screening with their health care providers. Ask your health care provider whether you should take a calcium supplement or vitamin D to reduce the rate of osteoporosis.  Menopause can be associated with physical symptoms and risks. Hormone replacement therapy is available to decrease symptoms and risks. You should talk to your health care provider about whether hormone replacement therapy is right for you.  Use sunscreen. Apply sunscreen liberally and repeatedly throughout the day. You should seek shade when your shadow is shorter than you. Protect yourself by wearing long sleeves, pants, a wide-brimmed hat, and sunglasses year round, whenever you are outdoors.  Once a month, do a whole body skin exam, using a mirror to look at the skin on your back. Tell your health care provider of new moles, moles that have irregular borders, moles that are larger than a pencil eraser, or moles that have changed in shape or  color.  Stay current with required vaccines (immunizations).  Influenza vaccine. All adults should be immunized every year.  Tetanus, diphtheria, and acellular pertussis (Td, Tdap) vaccine. Pregnant women should receive  1 dose of Tdap vaccine during each pregnancy. The dose should be obtained regardless of the length of time since the last dose. Immunization is preferred during the 27th-36th week of gestation. An adult who has not previously received Tdap or who does not know her vaccine status should receive 1 dose of Tdap. This initial dose should be followed by tetanus and diphtheria toxoids (Td) booster doses every 10 years. Adults with an unknown or incomplete history of completing a 3-dose immunization series with Td-containing vaccines should begin or complete a primary immunization series including a Tdap dose. Adults should receive a Td booster every 10 years.  Varicella vaccine. An adult without evidence of immunity to varicella should receive 2 doses or a second dose if she has previously received 1 dose. Pregnant females who do not have evidence of immunity should receive the first dose after pregnancy. This first dose should be obtained before leaving the health care facility. The second dose should be obtained 4-8 weeks after the first dose.  Human papillomavirus (HPV) vaccine. Females aged 13-26 years who have not received the vaccine previously should obtain the 3-dose series. The vaccine is not recommended for use in pregnant females. However, pregnancy testing is not needed before receiving a dose. If a female is found to be pregnant after receiving a dose, no treatment is needed. In that case, the remaining doses should be delayed until after the pregnancy. Immunization is recommended for any person with an immunocompromised condition through the age of 32 years if she did not get any or all doses earlier. During the 3-dose series, the second dose should be obtained 4-8 weeks after the  first dose. The third dose should be obtained 24 weeks after the first dose and 16 weeks after the second dose.  Zoster vaccine. One dose is recommended for adults aged 26 years or older unless certain conditions are present.  Measles, mumps, and rubella (MMR) vaccine. Adults born before [birth year] generally are considered immune to measles and mumps. Adults born in [birth year] or later should have 1 or more doses of MMR vaccine unless there is a contraindication to the vaccine or there is laboratory evidence of immunity to each of the three diseases. A routine second dose of MMR vaccine should be obtained at least 28 days after the first dose for students attending postsecondary schools, health care workers, or international travelers. People who received inactivated measles vaccine or an unknown type of measles vaccine during 1963-1967 should receive 2 doses of MMR vaccine. People who received inactivated mumps vaccine or an unknown type of mumps vaccine before 1979 and are at high risk for mumps infection should consider immunization with 2 doses of MMR vaccine. For females of childbearing age, rubella immunity should be determined. If there is no evidence of immunity, females who are not pregnant should be vaccinated. If there is no evidence of immunity, females who are pregnant should delay immunization until after pregnancy. Unvaccinated health care workers born before [birth year] who lack laboratory evidence of measles, mumps, or rubella immunity or laboratory confirmation of disease should consider measles and mumps immunization with 2 doses of MMR vaccine or rubella immunization with 1 dose of MMR vaccine.  Pneumococcal 13-valent conjugate (PCV13) vaccine. When indicated, a person who is uncertain of her immunization history and has no record of immunization should receive the PCV13 vaccine. An adult aged 47 years or older who has certain medical conditions and has not been previously immunized should receive 1 dose of  PCV13 vaccine. This PCV13 should be followed with a dose of pneumococcal polysaccharide (PPSV23) vaccine. The PPSV23 vaccine dose should be obtained at least 8 weeks after the dose of PCV13 vaccine. An adult aged 66 years or older who has certain medical conditions and previously received 1 or more doses of PPSV23 vaccine should receive 1 dose of PCV13. The PCV13 vaccine dose should be obtained 1 or more years after the last PPSV23 vaccine dose.  Pneumococcal polysaccharide (PPSV23) vaccine. When PCV13 is also indicated, PCV13 should be obtained first. All adults aged 41 years and older should be immunized. An adult younger than age 20 years who has certain medical conditions should be immunized. Any person who resides in a nursing home or long-term care facility should be immunized. An adult smoker should be immunized. People with an immunocompromised condition and certain other conditions should receive both PCV13 and PPSV23 vaccines. People with human immunodeficiency virus (HIV) infection should be immunized as soon as possible after diagnosis. Immunization during chemotherapy or radiation therapy should be avoided. Routine use of PPSV23 vaccine is not recommended for American Indians, [place] Natives, or people younger than 65 years unless there are medical conditions that require PPSV23 vaccine. When indicated, people who have unknown immunization and have no record of immunization should receive PPSV23 vaccine. One-time revaccination 5 years after the first dose of PPSV23 is recommended for people aged 19-64 years who have chronic kidney failure, nephrotic syndrome, asplenia, or immunocompromised conditions. People who received 1-2 doses of PPSV23 before age 33 years should receive another dose of PPSV23 vaccine at age 19 years or later if at least 5 years have passed since the previous dose. Doses of PPSV23 are not needed for people immunized with PPSV23 at or after age 15 years.  Meningococcal vaccine.  Adults with asplenia or persistent complement component deficiencies should receive 2 doses of quadrivalent meningococcal conjugate (MenACWY-D) vaccine. The doses should be obtained at least 2 months apart. Microbiologists working with certain meningococcal bacteria, [place] recruits, people at risk during an outbreak, and people who travel to or live in countries with a high rate of meningitis should be immunized. A first-year college student up through age 52 years who is living in a residence hall should receive a dose if she did not receive a dose on or after her 16th birthday. Adults who have certain high-risk conditions should receive one or more doses of vaccine.  Hepatitis A vaccine. Adults who wish to be protected from this disease, have certain high-risk conditions, work with hepatitis A-infected animals, work in hepatitis A research labs, or travel to or work in countries with a high rate of hepatitis A should be immunized. Adults who were previously unvaccinated and who anticipate close contact with an international adoptee during the first 60 days after arrival in the [place] from a country with a high rate of hepatitis A should be immunized.  Hepatitis B vaccine. Adults who wish to be protected from this disease, have certain high-risk conditions, may be exposed to blood or other infectious body fluids, are household contacts or sex partners of hepatitis B positive people, are clients or workers in certain care facilities, or travel to or work in countries with a high rate of hepatitis B should be immunized.  Haemophilus influenzae type b (Hib) vaccine. A previously unvaccinated person with asplenia or sickle cell disease or having a scheduled splenectomy should receive 1 dose of Hib vaccine. Regardless of previous immunization, a recipient of a hematopoietic stem  cell transplant should receive a 3-dose series 6-12 months after her successful transplant. Hib vaccine is not recommended for  adults with HIV infection. Preventive Services / Frequency  Ages 65 years and over  Blood pressure check.** / Every 1 to 2 years.  Lipid and cholesterol check.** / Every 5 years beginning at age 20 years.  Lung cancer screening. / Every year if you are aged 55-80 years and have a 30-pack-year history of smoking and currently smoke or have quit within the past 15 years. Yearly screening is stopped once you have quit smoking for at least 15 years or develop a health problem that would prevent you from having lung cancer treatment.  Clinical breast exam.** / Every year after age 40 years.  BRCA-related cancer risk assessment.** / For women who have family members with a BRCA-related cancer (breast, ovarian, tubal, or peritoneal cancers).  Mammogram.** / Every year beginning at age 40 years and continuing for as long as you are in good health. Consult with your health care provider.  Pap test.** / Every 3 years starting at age 30 years through age 65 or 70 years with 3 consecutive normal Pap tests. Testing can be stopped between 65 and 70 years with 3 consecutive normal Pap tests and no abnormal Pap or HPV tests in the past 10 years.  HPV screening.** / Every 3 years from ages 30 years through ages 65 or 70 years with a history of 3 consecutive normal Pap tests. Testing can be stopped between 65 and 70 years with 3 consecutive normal Pap tests and no abnormal Pap or HPV tests in the past 10 years.  Fecal occult blood test (FOBT) of stool. / Every year beginning at age 50 years and continuing until age 75 years. You may not need to do this test if you get a colonoscopy every 10 years.  Flexible sigmoidoscopy or colonoscopy.** / Every 5 years for a flexible sigmoidoscopy or every 10 years for a colonoscopy beginning at age 50 years and continuing until age 75 years.  Hepatitis C blood test.** / For all people born from 1945 through 1965 and any individual with known risks for hepatitis  C.  Osteoporosis screening.** / A one-time screening for women ages 65 years and over and women at risk for fractures or osteoporosis.  Skin self-exam. / Monthly.  Influenza vaccine. / Every year.  Tetanus, diphtheria, and acellular pertussis (Tdap/Td) vaccine.** / 1 dose of Td every 10 years.  Varicella vaccine.** / Consult your health care provider.  Zoster vaccine.** / 1 dose for adults aged 60 years or older.  Pneumococcal 13-valent conjugate (PCV13) vaccine.** / Consult your health care provider.  Pneumococcal polysaccharide (PPSV23) vaccine.** / 1 dose for all adults aged 65 years and older.  Meningococcal vaccine.** / Consult your health care provider.  Hepatitis A vaccine.** / Consult your health care provider.  Hepatitis B vaccine.** / Consult your health care provider.  Haemophilus influenzae type b (Hib) vaccine.** / Consult your health care provider.  

## 2014-02-17 NOTE — Progress Notes (Signed)
Patient ID: Lisa Hopkins, female   DOB: 09-Nov-1924, 80 y.o.   MRN: [id number]  MEDICARE ANNUAL WELLNESS VISIT AND CPE   Assessment:   1. Routine general medical examination at a health care facility   2. HYPERTENSION  - Microalbumin / creatinine urine ratio - EKG 12-Lead - [place], RETROPERITNL ABD,  LTD - TSH  3. Hyperlipidemia  - Lipid panel  4. T2 NIDDM w/ CKD III (GFR 59)  - Hemoglobin A1c - Insulin, fasting  5. Vitamin D deficiency  - Vit D  25 hydroxy (rtn osteoporosis monitoring)  6. Chronic atrial fibrillation  - Warfarin Therapy followed at [facility]   7. CVA   8. Encounter for long-term (current) use of other medications  - Urine Microscopic - CBC with Differential - BASIC METABOLIC PANEL WITH GFR - Hepatic function panel - Magnesium  9. Screening for malignant neoplasm of the rectum  - POC Hemoccult Bld/Stl (3-Cd Home Screen); Future  10. Screening for depression   11. Other specified conditions influencing health status(V49.89)  -Screening for falls  Plan:   During the course of the visit the patient was educated and counseled about appropriate screening and preventive services including:    Pneumococcal vaccine   Influenza vaccine  Td vaccine  Screening electrocardiogram  Screening mammography  Bone densitometry screening  Colorectal cancer screening  Diabetes screening  Glaucoma screening  Nutrition counseling   Advanced directives: given information/requested  Screening recommendations, referrals:  Vaccinations: DT vaccine 06/28/2003 Influenza vaccine HD 01/30/2013 Pneumococcal vaccine 04/27/2004 Shingles vaccine 04/27/2009 Hep B vaccine not indicated  Nutrition assessed and recommended  Colonoscopy 2007 Mammogram 01/28/2013 Pap smear not indicated Pelvic exam not indicated Recommended yearly ophthalmology/optometry visit for glaucoma screening and checkup Recommended yearly dental visit for hygiene and  checkup Advanced directives - yes  Conditions/risks identified: BMI: Discussed weight loss, diet, and increase physical activity.  Increase physical activity: AHA recommends 150 minutes of physical activity a week.  Medications reviewed DEXA- 01/21/2008 Diabetes at goal, ACE/ARB therapy No Urinary Incontinence is not an issue: discussed non pharmacology and pharmacology options.  Fall risk: moderate- discussed PT, home fall assessment, medications.   Subjective:   Lisa Hopkins is a 80 y.o. female who presents for Medicare Annual Wellness Visit and complete physical.    Date of last medicare wellness visit is unknown.  She has had elevated blood pressure since 1993. Her blood pressure has been controlled at home, today their BP is BP: 122/74 mmHg. 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 She's been on coumadin since.  She does not workout. She denies chest pain, shortness of breath, dizziness.  She is on cholesterol medication and denies myalgias. Her cholesterol is at goal. The cholesterol last visit was:   Lab Results  Component Value Date   CHOL 119 11/11/2013   HDL 43 11/11/2013   LDLCALC 56 11/11/2013   TRIG 99 11/11/2013   CHOLHDL 2.8 11/11/2013   She has had diabetes for 12 years (2003). She has been working on diet and exercise for diabetes, and denies foot ulcerations, hyperglycemia, hypoglycemia , nausea, paresthesia of the feet, polydipsia, polyuria and visual disturbances. Last A1C in the office was:  Lab Results  Component Value Date   HGBA1C 6.8* 11/11/2013   Patient is on Vitamin D supplement.  (D 35 in 2011) Lab Results  Component Value Date   VD25OH 100* 08/07/2013  Names of Other Physician/Practitioners you currently use: 1. [facility]- here for primary care 2. Dr Izell [place], eye doctor, last visit 2015 3. Dr  Irene [place], dentist, last visit 2015  Patient Care Team: Lucky Cowboy, MD as PCP - General (Internal Medicine) Delon Sacramento, MD as Consulting Physician (Ophthalmology) Lars Masson, MD as Consulting Physician (Cardiology) Florencia Reasons, MD as Consulting Physician (Gastroenterology) Claudie Revering, MD as Consulting Physician (Dermatology)  Medication Review Medication Sig  . acetaminophen (TYLENOL) 325 MG tablet Take 650 mg by mouth every 4 (four) hours as needed.    . Cholecalciferol (VITAMIN D) 2000 UNITS CAPS Take 1 capsule by mouth daily.   . hydrochlorothiazide (HYDRODIURIL) 25 MG tablet TAKE ONE TABLET BY MOUTH IN THE MORNING FOR BLOOD PRESSURE AND FLUID  . HYDROcodone-acetaminophen (NORCO/VICODIN) 5-325 MG per tablet Take 1-2 tablets by mouth every 4 (four) hours as needed.  . metoprolol (LOPRESSOR) 50 MG tablet TAKE ONE TABLET BY MOUTH TWICE DAILY  . pantoprazole (PROTONIX) 40 MG tablet TAKE ONE TABLET BY MOUTH TWICE DAILY FOR ACID INDIGESTION  . polyethylene glycol (MIRALAX / GLYCOLAX) packet Take 17 g by mouth daily.  . ranitidine (ZANTAC) 300 MG tablet TAKE ONE TABLET BY MOUTH TWICE DAILY  . rosuvastatin (CRESTOR) 20 MG tablet Take 1 tablet (20 mg total) by mouth every evening.  . triamcinolone cream (KENALOG) 0.1 % Apply 1 application topically 2 (two) times daily. To skin rash  . warfarin (COUMADIN) 2 MG tablet TAKE AS DIRECTED BY ANTICOAGULATION CLINIC   Current Problems (verified) Patient Active Problem List   Diagnosis Date Noted  . Encounter for long-term (current) use of other medications 08/07/2013  . Encounter for therapeutic drug monitoring 07/01/2013  . Hyperlipidemia   . Vitamin D deficiency   . T2 NIDDM w/ CKD III (GFR 59) 03/15/2010  . ANXIETY DISORDER 03/15/2010  . HYPERTENSION 03/15/2010  . Atrial fibrillation 03/15/2010  . APHASIA DUE TO CEREBROVASCULAR DISEASE 03/15/2010  . HIATAL HERNIA 03/15/2010  . CVA 03/07/2010    Screening Tests Health Maintenance  Topic Date Due  . Foot Exam  12/23/1936  . Ophthalmology Exam  12/23/1936  . Urine Microalbumin  12/23/1936  . Tetanus/tdap  06/27/2013  . Influenza Vaccine  12/27/2013  . Hemoglobin A1c  05/13/2014  . Colonoscopy  11/26/2015  . Pneumococcal Polysaccharide Vaccine Age 28 And Over  Completed  . Zostavax  Completed   Immunization History  Administered Date(s) Administered  . Influenza-Unspecified 01/30/2013  . Pneumococcal-Unspecified 04/27/2004  . Td 06/28/2003  . Zoster 04/27/2009   Preventative care: Last colonoscopy: 2007 Last mammogram: 01/28/2013 Last pap smear/pelvic exam: >20 yrs   DEXA:01/21/2008  History reviewed: allergies, current medications, past family history, past medical history, past social history, past surgical history and problem list  Risk Factors: Osteoporosis: postmenopausal estrogen deficiency and amenorrhea History of fracture in the past year: no  Tobacco History  Substance Use Topics  . Smoking status: Never Smoker   . Smokeless tobacco: Never Used  . Alcohol Use: No   She does not smoke.  Patient is not a former smoker. Are there smokers in your home (other than you)?  No  Alcohol Current alcohol use: none  Caffeine Current caffeine use: denies use  Exercise  Current exercise: none  Nutrition/Diet Current diet: in general, a "healthy" diet    Cardiac risk factors: advanced age (older than 31 for men, 23 for women), diabetes mellitus, dyslipidemia, hypertension and sedentary lifestyle.  Depression Screen (Note: if answer to either of the following is "Yes", a more complete depression screening is indicated)   Q1: Over the past two weeks, have you felt down, depressed or hopeless? No  Q2: Over the past two weeks, have you felt little interest or pleasure in doing things? No  Have you lost interest or pleasure in daily life? No  Do you often feel hopeless? No  Do you cry easily over simple  problems? No  Activities of Daily Living In your present state of health, do you have any difficulty performing the following activities?:  Driving? No Managing money?  No Feeding yourself? No Getting from bed to chair? No Climbing a flight of stairs? No Preparing food and eating?: No Bathing or showering? No Getting dressed: No Getting to the toilet? No Using the toilet:No Moving around from place to place: No In the past year have you fallen or had a near fall?:No  Are you sexually active?  No  Do you have more than one partner?  No  Vision Difficulties: No  Hearing Difficulties: No Do you often ask people to speak up or repeat themselves? No Do you experience ringing or noises in your ears? No Do you have difficulty understanding soft or whispered voices? No  Cognition  Do you feel that you have a problem with memory?Yes  Do you often misplace items? No  Do you feel safe at home?  Yes  Advanced directives Does patient have a Health Care Power of Attorney? Yes Does patient have a Living Will? Yes   Objective:     Blood pressure 122/74, pulse 70, temperature 98.6 F (37 C), temperature source Temporal, resp. rate 16, height 5\' 2"  (1.575 m), weight 123 lb (55.792 kg). Body mass index is 22.49 kg/(m^2).  General appearance: alert, no distress, WD/WN,  female Cognitive Testing  Alert? Yes  Normal Appearance?Yes  Oriented to person? Yes  Place? Yes   Time? Yes  Recall of three objects?  No  Can perform simple calculations? Yes  Displays appropriate judgment?Yes  Can read the correct time from a watch face?Yes  HEENT: normocephalic, sclerae anicteric, TMs pearly, nares patent, no discharge or erythema, pharynx normal Oral cavity: MMM, no lesions Neck: supple, no lymphadenopathy, no thyromegaly, no masses Heart: RRR, normal S1, S2, no murmurs Lungs: CTA bilaterally, no wheezes, rhonchi, or rales Abdomen: +bs, soft, non tender, non distended, no masses, no  hepatomegaly, no splenomegaly Musculoskeletal: nontender, no swelling, no obvious deformity Extremities: no edema, no cyanosis, no clubbing Pulses: 2+ symmetric, upper and lower extremities, normal cap refill Neurological: alert, oriented x 3, CN2-12 intact, strength normal upper extremities and lower extremities, sensation normal throughout, DTRs 2+ throughout, no cerebellar signs, gait normal Psychiatric: normal affect, behavior normal, pleasant  Breast:   nontender, no masses or lumps, no skin changes, no nipple discharge or inversion, no axillary lymphadenopathy Gyn: defer  Rectal: defer  Medicare Attestation I have personally reviewed: The patient's medical and social history Their use of alcohol, tobacco or illicit drugs Their current medications and supplements The patient's functional ability including ADLs,fall risks, home safety risks, cognitive, and hearing and visual impairment Diet and physical activities Evidence for depression or mood disorders  The patient's weight, height, BMI, and visual acuity have been recorded in the chart.  I have made referrals, counseling, and provided education to the patient based on review of the above and I have provided the patient with a written personalized care plan for preventive services.  Thailan Sava DAVID, MD   02/17/2014

## 2014-02-18 LAB — URINALYSIS, MICROSCOPIC ONLY
CRYSTALS: NONE SEEN
Casts: NONE SEEN

## 2014-02-18 LAB — BASIC METABOLIC PANEL WITH GFR
BUN: 19 mg/dL (ref 6–23)
CHLORIDE: 98 meq/L (ref 96–112)
CO2: 33 mEq/L — ABNORMAL HIGH (ref 19–32)
Calcium: 10.2 mg/dL (ref 8.4–10.5)
Creat: 0.86 mg/dL (ref 0.50–1.10)
GFR, EST NON AFRICAN AMERICAN: 61 mL/min
GFR, Est African American: 70 mL/min
Glucose, Bld: 117 mg/dL — ABNORMAL HIGH (ref 70–99)
POTASSIUM: 3.5 meq/L (ref 3.5–5.3)
Sodium: 140 mEq/L (ref 135–145)

## 2014-02-18 LAB — VITAMIN D 25 HYDROXY (VIT D DEFICIENCY, FRACTURES): Vit D, 25-Hydroxy: 101 ng/mL — ABNORMAL HIGH (ref 30–89)

## 2014-02-18 LAB — HEMOGLOBIN A1C
Hgb A1c MFr Bld: 6.7 % — ABNORMAL HIGH (ref <5.7)
MEAN PLASMA GLUCOSE: 146 mg/dL — AB (ref <117)

## 2014-02-18 LAB — LIPID PANEL
Cholesterol: 119 mg/dL (ref 0–200)
HDL: 48 mg/dL (ref >39)
LDL Cholesterol: 43 mg/dL (ref 0–99)
TRIGLYCERIDES: 141 mg/dL (ref <150)
Total CHOL/HDL Ratio: 2.5 Ratio
VLDL: 28 mg/dL (ref 0–40)

## 2014-02-18 LAB — TSH: TSH: 1.164 u[IU]/mL (ref 0.350–4.500)

## 2014-02-18 LAB — HEPATIC FUNCTION PANEL
ALT: 18 U/L (ref 0–35)
AST: 21 U/L (ref 0–37)
Albumin: 4.1 g/dL (ref 3.5–5.2)
Alkaline Phosphatase: 60 U/L (ref 39–117)
BILIRUBIN DIRECT: 0.2 mg/dL (ref 0.0–0.3)
BILIRUBIN INDIRECT: 0.6 mg/dL (ref 0.2–1.2)
TOTAL PROTEIN: 6.8 g/dL (ref 6.0–8.3)
Total Bilirubin: 0.8 mg/dL (ref 0.2–1.2)

## 2014-02-18 LAB — MAGNESIUM: Magnesium: 1.6 mg/dL (ref 1.5–2.5)

## 2014-02-18 LAB — MICROALBUMIN / CREATININE URINE RATIO
CREATININE, URINE: 104.8 mg/dL
MICROALB/CREAT RATIO: 11.5 mg/g (ref 0.0–30.0)
Microalb, Ur: 1.2 mg/dL (ref <2.0)

## 2014-02-18 LAB — INSULIN, FASTING: Insulin fasting, serum: 18 u[IU]/mL (ref 2.0–19.6)

## 2014-03-02 ENCOUNTER — Other Ambulatory Visit (INDEPENDENT_AMBULATORY_CARE_PROVIDER_SITE_OTHER): Payer: Medicare Other | Admitting: *Deleted

## 2014-03-02 DIAGNOSIS — Z1212 Encounter for screening for malignant neoplasm of rectum: Secondary | ICD-10-CM

## 2014-03-02 LAB — POC HEMOCCULT BLD/STL (HOME/3-CARD/SCREEN)
Card #2 Fecal Occult Blod, POC: NEGATIVE
FECAL OCCULT BLD: NEGATIVE
FECAL OCCULT BLD: NEGATIVE

## 2014-03-03 DIAGNOSIS — M7070 Other bursitis of hip, unspecified hip: Secondary | ICD-10-CM | POA: Insufficient documentation

## 2014-03-04 ENCOUNTER — Other Ambulatory Visit: Payer: Self-pay | Admitting: Internal Medicine

## 2014-03-05 ENCOUNTER — Ambulatory Visit (INDEPENDENT_AMBULATORY_CARE_PROVIDER_SITE_OTHER): Payer: Medicare Other | Admitting: *Deleted

## 2014-03-05 ENCOUNTER — Other Ambulatory Visit: Payer: Self-pay | Admitting: Physician Assistant

## 2014-03-05 DIAGNOSIS — H6122 Impacted cerumen, left ear: Secondary | ICD-10-CM

## 2014-03-05 DIAGNOSIS — Z23 Encounter for immunization: Secondary | ICD-10-CM

## 2014-03-06 ENCOUNTER — Ambulatory Visit (INDEPENDENT_AMBULATORY_CARE_PROVIDER_SITE_OTHER): Payer: Medicare Other | Admitting: Pharmacist

## 2014-03-06 DIAGNOSIS — I482 Chronic atrial fibrillation, unspecified: Secondary | ICD-10-CM

## 2014-03-06 DIAGNOSIS — I6350 Cerebral infarction due to unspecified occlusion or stenosis of unspecified cerebral artery: Secondary | ICD-10-CM

## 2014-03-06 DIAGNOSIS — I639 Cerebral infarction, unspecified: Secondary | ICD-10-CM

## 2014-03-06 DIAGNOSIS — I4891 Unspecified atrial fibrillation: Secondary | ICD-10-CM

## 2014-03-06 DIAGNOSIS — Z5181 Encounter for therapeutic drug level monitoring: Secondary | ICD-10-CM

## 2014-03-06 LAB — POCT INR: INR: 3.5

## 2014-03-10 ENCOUNTER — Other Ambulatory Visit: Payer: Self-pay

## 2014-03-10 MED ORDER — HYDROCHLOROTHIAZIDE 25 MG PO TABS: 25.0000 mg | ORAL_TABLET | Freq: Every day | ORAL | Status: DC

## 2014-03-13 DIAGNOSIS — M7060 Trochanteric bursitis, unspecified hip: Secondary | ICD-10-CM | POA: Insufficient documentation

## 2014-03-20 ENCOUNTER — Ambulatory Visit (INDEPENDENT_AMBULATORY_CARE_PROVIDER_SITE_OTHER): Payer: Medicare Other

## 2014-03-20 DIAGNOSIS — I639 Cerebral infarction, unspecified: Secondary | ICD-10-CM

## 2014-03-20 DIAGNOSIS — I6350 Cerebral infarction due to unspecified occlusion or stenosis of unspecified cerebral artery: Secondary | ICD-10-CM

## 2014-03-20 DIAGNOSIS — Z5181 Encounter for therapeutic drug level monitoring: Secondary | ICD-10-CM

## 2014-03-20 DIAGNOSIS — I4891 Unspecified atrial fibrillation: Secondary | ICD-10-CM

## 2014-03-20 LAB — POCT INR: INR: 2.7

## 2014-03-23 ENCOUNTER — Other Ambulatory Visit: Payer: Self-pay | Admitting: Internal Medicine

## 2014-03-23 DIAGNOSIS — Z1231 Encounter for screening mammogram for malignant neoplasm of breast: Secondary | ICD-10-CM

## 2014-04-09 ENCOUNTER — Ambulatory Visit (HOSPITAL_COMMUNITY)
Admission: RE | Admit: 2014-04-09 | Discharge: 2014-04-09 | Disposition: A | Discharge status: Home / Self Care | Payer: Medicare Other | Source: Ambulatory Visit | Attending: Internal Medicine | Admitting: Internal Medicine

## 2014-04-09 DIAGNOSIS — Z1231 Encounter for screening mammogram for malignant neoplasm of breast: Secondary | ICD-10-CM | POA: Diagnosis not present

## 2014-04-17 ENCOUNTER — Ambulatory Visit (INDEPENDENT_AMBULATORY_CARE_PROVIDER_SITE_OTHER): Payer: Medicare Other

## 2014-04-17 DIAGNOSIS — Z5181 Encounter for therapeutic drug level monitoring: Secondary | ICD-10-CM

## 2014-04-17 DIAGNOSIS — I4891 Unspecified atrial fibrillation: Secondary | ICD-10-CM

## 2014-04-17 DIAGNOSIS — I639 Cerebral infarction, unspecified: Secondary | ICD-10-CM

## 2014-04-17 DIAGNOSIS — I6350 Cerebral infarction due to unspecified occlusion or stenosis of unspecified cerebral artery: Secondary | ICD-10-CM

## 2014-04-17 LAB — POCT INR: INR: 2.2

## 2014-05-08 ENCOUNTER — Other Ambulatory Visit: Payer: Self-pay | Admitting: *Deleted

## 2014-05-08 MED ORDER — RANITIDINE HCL 300 MG PO TABS: 300.0000 mg | ORAL_TABLET | Freq: Two times a day (BID) | ORAL | Status: DC

## 2014-05-15 ENCOUNTER — Ambulatory Visit (INDEPENDENT_AMBULATORY_CARE_PROVIDER_SITE_OTHER): Payer: Medicare Other

## 2014-05-15 DIAGNOSIS — I6350 Cerebral infarction due to unspecified occlusion or stenosis of unspecified cerebral artery: Secondary | ICD-10-CM

## 2014-05-15 DIAGNOSIS — Z5181 Encounter for therapeutic drug level monitoring: Secondary | ICD-10-CM

## 2014-05-15 DIAGNOSIS — I639 Cerebral infarction, unspecified: Secondary | ICD-10-CM

## 2014-05-15 DIAGNOSIS — I4891 Unspecified atrial fibrillation: Secondary | ICD-10-CM

## 2014-05-15 LAB — POCT INR: INR: 2.4

## 2014-05-19 ENCOUNTER — Other Ambulatory Visit: Payer: Self-pay | Admitting: Emergency Medicine

## 2014-06-08 ENCOUNTER — Emergency Department (HOSPITAL_COMMUNITY): Payer: Medicare Other

## 2014-06-08 ENCOUNTER — Encounter (HOSPITAL_COMMUNITY): Payer: Self-pay | Admitting: Emergency Medicine

## 2014-06-08 ENCOUNTER — Emergency Department (HOSPITAL_COMMUNITY)
Admission: EM | Admit: 2014-06-08 | Discharge: 2014-06-08 | Disposition: A | Discharge status: Home / Self Care | Payer: Medicare Other | Attending: Emergency Medicine | Admitting: Emergency Medicine

## 2014-06-08 DIAGNOSIS — Z7952 Long term (current) use of systemic steroids: Secondary | ICD-10-CM | POA: Diagnosis not present

## 2014-06-08 DIAGNOSIS — R1013 Epigastric pain: Secondary | ICD-10-CM

## 2014-06-08 DIAGNOSIS — E559 Vitamin D deficiency, unspecified: Secondary | ICD-10-CM | POA: Diagnosis not present

## 2014-06-08 DIAGNOSIS — Z79899 Other long term (current) drug therapy: Secondary | ICD-10-CM | POA: Diagnosis not present

## 2014-06-08 DIAGNOSIS — E876 Hypokalemia: Secondary | ICD-10-CM | POA: Diagnosis not present

## 2014-06-08 DIAGNOSIS — I10 Essential (primary) hypertension: Secondary | ICD-10-CM | POA: Diagnosis not present

## 2014-06-08 DIAGNOSIS — Z8673 Personal history of transient ischemic attack (TIA), and cerebral infarction without residual deficits: Secondary | ICD-10-CM | POA: Insufficient documentation

## 2014-06-08 DIAGNOSIS — I4891 Unspecified atrial fibrillation: Secondary | ICD-10-CM | POA: Diagnosis not present

## 2014-06-08 DIAGNOSIS — R197 Diarrhea, unspecified: Secondary | ICD-10-CM | POA: Diagnosis not present

## 2014-06-08 DIAGNOSIS — E785 Hyperlipidemia, unspecified: Secondary | ICD-10-CM | POA: Insufficient documentation

## 2014-06-08 DIAGNOSIS — E119 Type 2 diabetes mellitus without complications: Secondary | ICD-10-CM | POA: Diagnosis not present

## 2014-06-08 DIAGNOSIS — R51 Headache: Secondary | ICD-10-CM | POA: Insufficient documentation

## 2014-06-08 DIAGNOSIS — Z8659 Personal history of other mental and behavioral disorders: Secondary | ICD-10-CM | POA: Insufficient documentation

## 2014-06-08 DIAGNOSIS — Z9089 Acquired absence of other organs: Secondary | ICD-10-CM | POA: Insufficient documentation

## 2014-06-08 DIAGNOSIS — K219 Gastro-esophageal reflux disease without esophagitis: Secondary | ICD-10-CM | POA: Insufficient documentation

## 2014-06-08 DIAGNOSIS — R519 Headache, unspecified: Secondary | ICD-10-CM

## 2014-06-08 DIAGNOSIS — Z7901 Long term (current) use of anticoagulants: Secondary | ICD-10-CM | POA: Diagnosis not present

## 2014-06-08 LAB — CBC WITH DIFFERENTIAL/PLATELET
BASOS PCT: 0 % (ref 0–1)
Basophils Absolute: 0 10*3/uL (ref 0.0–0.1)
EOS PCT: 2 % (ref 0–5)
Eosinophils Absolute: 0.1 10*3/uL (ref 0.0–0.7)
HEMATOCRIT: 45.2 % (ref 36.0–46.0)
Hemoglobin: 15.2 g/dL — ABNORMAL HIGH (ref 12.0–15.0)
LYMPHS ABS: 2 10*3/uL (ref 0.7–4.0)
Lymphocytes Relative: 25 % (ref 12–46)
MCH: 32.1 pg (ref 26.0–34.0)
MCHC: 33.6 g/dL (ref 30.0–36.0)
MCV: 95.6 fL (ref 78.0–100.0)
Monocytes Absolute: 0.7 10*3/uL (ref 0.1–1.0)
Monocytes Relative: 8 % (ref 3–12)
NEUTROS PCT: 65 % (ref 43–77)
Neutro Abs: 5 10*3/uL (ref 1.7–7.7)
Platelets: 190 10*3/uL (ref 150–400)
RBC: 4.73 MIL/uL (ref 3.87–5.11)
RDW: 12.7 % (ref 11.5–15.5)
WBC: 7.8 10*3/uL (ref 4.0–10.5)

## 2014-06-08 LAB — COMPREHENSIVE METABOLIC PANEL
ALBUMIN: 3.7 g/dL (ref 3.5–5.2)
ALT: 19 U/L (ref 0–35)
AST: 26 U/L (ref 0–37)
Alkaline Phosphatase: 66 U/L (ref 39–117)
Anion gap: 10 (ref 5–15)
BUN: 19 mg/dL (ref 6–23)
CO2: 32 mmol/L (ref 19–32)
CREATININE: 0.74 mg/dL (ref 0.50–1.10)
Calcium: 9.6 mg/dL (ref 8.4–10.5)
Chloride: 97 mEq/L (ref 96–112)
GFR, EST AFRICAN AMERICAN: 86 mL/min — AB (ref >90)
GFR, EST NON AFRICAN AMERICAN: 74 mL/min — AB (ref >90)
Glucose, Bld: 155 mg/dL — ABNORMAL HIGH (ref 70–99)
Potassium: 3.2 mmol/L — ABNORMAL LOW (ref 3.5–5.1)
SODIUM: 139 mmol/L (ref 135–145)
Total Bilirubin: 0.9 mg/dL (ref 0.3–1.2)
Total Protein: 6.9 g/dL (ref 6.0–8.3)

## 2014-06-08 LAB — URINALYSIS, ROUTINE W REFLEX MICROSCOPIC
Bilirubin Urine: NEGATIVE
Glucose, UA: NEGATIVE mg/dL
Ketones, ur: NEGATIVE mg/dL
LEUKOCYTES UA: NEGATIVE
NITRITE: NEGATIVE
PH: 5.5 (ref 5.0–8.0)
Protein, ur: NEGATIVE mg/dL
Specific Gravity, Urine: 1.012 (ref 1.005–1.030)
Urobilinogen, UA: 0.2 mg/dL (ref 0.0–1.0)

## 2014-06-08 LAB — URINE MICROSCOPIC-ADD ON

## 2014-06-08 LAB — TROPONIN I: Troponin I: <0.03 ng/mL (ref <0.031)

## 2014-06-08 LAB — LIPASE, BLOOD: Lipase: 47 U/L (ref 11–59)

## 2014-06-08 LAB — PROTIME-INR
INR: 2.14 — ABNORMAL HIGH (ref 0.00–1.49)
Prothrombin Time: 24.1 seconds — ABNORMAL HIGH (ref 11.6–15.2)

## 2014-06-08 MED ORDER — IOHEXOL 300 MG/ML  SOLN
25.0000 mL | Freq: Once | INTRAMUSCULAR | Status: AC | PRN
  Administered 2014-06-08: 25 mL via INTRAVENOUS

## 2014-06-08 MED ORDER — SODIUM CHLORIDE 0.9 % IV BOLUS (SEPSIS)
500.0000 mL | Freq: Once | INTRAVENOUS | Status: AC
  Administered 2014-06-08: 500 mL via INTRAVENOUS

## 2014-06-08 MED ORDER — IOHEXOL 300 MG/ML  SOLN
80.0000 mL | Freq: Once | INTRAMUSCULAR | Status: AC | PRN
  Administered 2014-06-08: 80 mL via INTRAVENOUS

## 2014-06-08 NOTE — ED Provider Notes (Signed)
CSN: [id number]     Arrival date & time 06/08/14  0946 History   First MD Initiated Contact with Patient 06/08/14 1036     Chief Complaint  Patient presents with  . Headache  . foul urine      (Consider location/radiation/quality/duration/timing/severity/associated sxs/prior Treatment) Patient is a 81 y.o. female presenting with headaches. The history is provided by the patient.  Headache Pain location:  Frontal Quality:  Dull Severity currently:  0/10 Pain scale at highest: mild. Onset quality:  Gradual Duration:  2 hours Progression:  Resolved Chronicity:  New Associated symptoms: abdominal pain and diarrhea   Associated symptoms: no back pain, no congestion, no cough, no dizziness, no pain, no fatigue, no fever, no nausea, no neck pain and no vomiting   Abdominal pain:    Location:  Epigastric   Quality:  Aching   Severity:  Moderate   Onset quality:  Sudden   Duration:  2 hours   Timing:  Constant   Progression:  Resolved   Chronicity:  Recurrent   Past Medical History  Diagnosis Date  . Atrial fibrillation   . Aphasia as late effect of cerebrovascular accident   . CVA (cerebral infarction)   . Hyperlipidemia   . HTN (hypertension)   . Anxiety   . Diabetes mellitus type II   . Hiatal hernia   . GERD (gastroesophageal reflux disease)   . IBS (irritable bowel syndrome)   . Vitamin D deficiency   . Stroke    Past Surgical History  Procedure Laterality Date  . Upper gastrointestinal endoscopy  2005  . Colonoscopy  2005  . Cholecystectomy    . Cataract extraction, bilateral    . Tonsillectomy     Family History  Problem Relation Age of Onset  . Diabetes Mother   . Stroke Mother   . Hypertension Brother   . Heart disease Brother   . Arthritis Sister     Rhematoid  . Arthritis Sister     Rhematoid   History  Substance Use Topics  . Smoking status: Never Smoker   . Smokeless tobacco: Never Used  . Alcohol Use: No   OB History    No data available      Review of Systems  Constitutional: Negative for fever and fatigue.  HENT: Negative for congestion and drooling.   Eyes: Negative for pain.  Respiratory: Negative for cough and shortness of breath.   Cardiovascular: Negative for chest pain.  Gastrointestinal: Positive for abdominal pain and diarrhea. Negative for nausea and vomiting.  Genitourinary: Negative for dysuria and hematuria.  Musculoskeletal: Negative for back pain, gait problem and neck pain.  Skin: Negative for color change.  Neurological: Positive for headaches. Negative for dizziness.  Hematological: Negative for adenopathy.  Psychiatric/Behavioral: Negative for behavioral problems.  All other systems reviewed and are negative.     Allergies  Ace inhibitors; Fosamax; Norvasc; and Zocor  Home Medications   Prior to Admission medications   Medication Sig Start Date End Date Taking? Authorizing Provider  acetaminophen (TYLENOL) 325 MG tablet Take 650 mg by mouth every 4 (four) hours as needed.     Yes Historical Provider, MD  Cholecalciferol (VITAMIN D) 2000 UNITS CAPS Take 1 capsule by mouth daily.    Yes Historical Provider, MD  hydrochlorothiazide (HYDRODIURIL) 25 MG tablet Take 1 tablet (25 mg total) by mouth daily. 03/10/14  Yes Quentin Mulling, PA-C  HYDROcodone-acetaminophen (NORCO/VICODIN) 5-325 MG per tablet Take 1-2 tablets by mouth every 4 (four) hours  as needed. 11/13/13  Yes Melissa R Smith, PA-C  metoprolol (LOPRESSOR) 50 MG tablet TAKE ONE TABLET BY MOUTH TWICE DAILY 02/01/14 02/02/15 Yes Lucky Cowboy, MD  pantoprazole (PROTONIX) 40 MG tablet TAKE ONE TABLET BY MOUTH ONCE DAILY FOR ACID INDIGESTION. 05/19/14  Yes Lucky Cowboy, MD  polyethylene glycol [facility] / GLYCOLAX) packet Take 17 g by mouth daily. Patient taking differently: Take 17 g by mouth daily as needed for moderate constipation or severe constipation.  03/17/12  Yes Dorothea Ogle, MD  rosuvastatin (CRESTOR) 20 MG tablet Take 1 tablet (20  mg total) by mouth every evening. 11/18/13  Yes Quentin Mulling, PA-C  warfarin (COUMADIN) 2 MG tablet TAKE AS DIRECTED BY ANTICOAGULATION CLINIC 12/26/13  Yes Lars Masson, MD  ranitidine (ZANTAC) 300 MG tablet Take 1 tablet (300 mg total) by mouth 2 (two) times daily. Patient not taking: Reported on 06/08/2014 05/08/14   Lucky Cowboy, MD  triamcinolone cream (KENALOG) 0.1 % Apply 1 application topically 2 (two) times daily. To skin rash Patient not taking: Reported on 06/08/2014 08/28/13   Lucky Cowboy, MD   BP 156/61 mmHg  Pulse 60  Resp 20  SpO2 100% Physical Exam  Constitutional: She is oriented to person, place, and time. She appears well-developed and well-nourished.  HENT:  Head: Normocephalic and atraumatic.  Mouth/Throat: Oropharynx is clear and moist. No oropharyngeal exudate.  Eyes: Conjunctivae and EOM are normal. Pupils are equal, round, and reactive to light.  Neck: Normal range of motion. Neck supple.  Cardiovascular: Normal rate, regular rhythm, normal heart sounds and intact distal pulses.  Exam reveals no gallop and no friction rub.   No murmur heard. Pulmonary/Chest: Effort normal and breath sounds normal. No respiratory distress. She has no wheezes.  Abdominal: Soft. Bowel sounds are normal. There is no tenderness. There is no rebound and no guarding.  Musculoskeletal: Normal range of motion. She exhibits no edema or tenderness.  Neurological: She is alert and oriented to person, place, and time.  alert, oriented x3 speech: normal in context and clarity memory: intact grossly cranial nerves II-XII: intact motor strength: full proximally and distally sensation: intact to light touch diffusely  cerebellar: finger-to-nose and heel-to-shin intact gait: normal forwards and backwards, normal tandem gait   Skin: Skin is warm and dry.  Psychiatric: She has a normal mood and affect. Her behavior is normal.  Nursing note and vitals reviewed.   ED Course   Procedures (including critical care time) Labs Review Labs Reviewed  URINALYSIS, ROUTINE W REFLEX MICROSCOPIC - Abnormal; Notable for the following:    Hgb urine dipstick SMALL (*)    All other components within normal limits  CBC WITH DIFFERENTIAL - Abnormal; Notable for the following:    Hemoglobin 15.2 (*)    All other components within normal limits  COMPREHENSIVE METABOLIC PANEL - Abnormal; Notable for the following:    Potassium 3.2 (*)    Glucose, Bld 155 (*)    GFR calc non Af Amer 74 (*)    GFR calc Af Amer 86 (*)    All other components within normal limits  PROTIME-INR - Abnormal; Notable for the following:    Prothrombin Time 24.1 (*)    INR 2.14 (*)    All other components within normal limits  LIPASE, BLOOD  TROPONIN I  URINE MICROSCOPIC-ADD ON    Imaging Review Ct Head Wo Contrast  06/08/2014   CLINICAL DATA:  Headache since yesterday.  EXAM: CT HEAD WITHOUT CONTRAST  TECHNIQUE:  Contiguous axial images were obtained from the base of the skull through the vertex without intravenous contrast.  COMPARISON:  01/22/2014.  FINDINGS: No evidence of an acute infarct, acute hemorrhage, mass lesion, mass effect or hydrocephalus. Remote infarcts in the right cerebellum and left frontal lobe. Atrophy. Confluent periventricular low attenuation. Visualized portions of the paranasal sinuses and mastoid air cells are clear.  IMPRESSION: 1. No acute intracranial abnormality. 2. Atrophy and chronic microvascular white matter ischemic changes. 3. Remote infarcts in the left frontal lobe and right cerebellum.   Electronically Signed   By: Leanna Battles M.D.   On: 06/08/2014 13:06   Ct Abdomen Pelvis W Contrast  06/08/2014   CLINICAL DATA:  Worsening epigastric pain.  EXAM: CT ABDOMEN AND PELVIS WITH CONTRAST  TECHNIQUE: Multidetector CT imaging of the abdomen and pelvis was performed using the standard protocol following bolus administration of intravenous contrast.  CONTRAST:  80mL  OMNIPAQUE IOHEXOL 300 MG/ML SOLN, 25mL OMNIPAQUE IOHEXOL 300 MG/ML SOLN  COMPARISON:  03/12/2012.  FINDINGS: Lower chest: Lung bases show no acute findings. There is a large hiatal hernia containing most of the stomach, as on 03/12/2012. Heart is at the upper limits of normal in size to mildly enlarged. Coronary artery calcification. No pericardial or pleural effusion.  Hepatobiliary: Liver is unremarkable. Cholecystectomy. Mild biliary ductal dilatation is unchanged.  Pancreas: Negative.  Spleen: Negative.  Adrenals/Urinary Tract: Adrenal glands and right kidney are unremarkable. Scarring in the left kidney, which is otherwise unremarkable. Ureters are decompressed. Bladder is grossly unremarkable.  Stomach/Bowel: Large hiatal hernia contains most of the stomach, as discussed above. Stomach, small bowel and colon are otherwise unremarkable. Appendix is not readily visualized but there are no inflammatory changes in the right lower quadrant.  Vascular/Lymphatic: Atherosclerotic calcification of the arterial vasculature without abdominal aortic aneurysm. No pathologically enlarged lymph nodes.  Reproductive: Calcified lesions in the uterus are likely due to fibroids. Ovaries are visualized.  Other: No free fluid.  Mesenteries and peritoneum are unremarkable.  Musculoskeletal: No worrisome lytic or sclerotic lesions. Degenerative changes are seen at the symphysis pubis and spine.  IMPRESSION: 1. No acute findings to explain the patient's pain. 2. Large hiatal hernia contains the majority of the stomach, unchanged from 03/12/2012. 3. Coronary artery calcification.   Electronically Signed   By: Leanna Battles M.D.   On: 06/08/2014 13:12     EKG Interpretation   Date/Time:  Monday June 08 2014 09:57:45 EST Ventricular Rate:  66 PR Interval:    QRS Duration: 88 QT Interval:  441 QTC Calculation: 462 R Axis:   60 Text Interpretation:  Atrial fibrillation Ventricular premature complex  Baseline wander in  lead(s) V2 V6 No significant change since last tracing  Confirmed by Ender Rorke  MD, Leigh Blas ([id number]) on 06/08/2014 10:45:02 AM      MDM   Final diagnoses:  Headache  Epigastric pain  Hypokalemia    11:29 AM 81 y.o. female w hx of afib on coumadin, CVA, DM who presents with abdominal pain and headache. She states that she was having daily stomachaches a few months ago which had resolved. Last night after eating she began having epigastric pain and developed a gradual onset headache which lasted a few hours. Her symptoms have since resolved but she was referred to the ER by her doctor. She denies any fevers or vomiting. She did have some nonbloody diarrhea yesterday. She is afebrile and vital signs are unremarkable here. We'll get screening labs and imaging.  2:40 PM:  I interpreted/reviewed the labs and/or imaging which were non-contributory. She remains asx.  Pt hungry on exam, wanting to eat. She continues to appear well. Only mild hypokalemia noted which can be supplemented w/ her diet.  I have discussed the diagnosis/risks/treatment options with the patient and believe the pt to be eligible for discharge home to follow-up with her pcp. We also discussed returning to the ED immediately if new or worsening sx occur. We discussed the sx which are most concerning (e.g., worsening pain, return of HA, fever) that necessitate immediate return. Medications administered to the patient during their visit and any new prescriptions provided to the patient are listed below.  Medications given during this visit Medications  sodium chloride 0.9 % bolus 500 mL (0 mLs Intravenous Stopped 06/08/14 1240)  iohexol (OMNIPAQUE) 300 MG/ML solution 25 mL (25 mLs Intravenous Contrast Given 06/08/14 1200)  iohexol (OMNIPAQUE) 300 MG/ML solution 80 mL (80 mLs Intravenous Contrast Given 06/08/14 1248)    Discharge Medication List as of 06/08/2014  2:39 PM       Purvis SheffieldForrest Brendy Ficek, MD 06/08/14 1557

## 2014-06-08 NOTE — ED Notes (Signed)
I made patient aware that we need a urine sample 

## 2014-06-08 NOTE — Discharge Instructions (Signed)
Abdominal Pain, Women °Abdominal (stomach, pelvic, or belly) pain can be caused by many things. It is important to tell your doctor: °· The location of the pain. °· Does it come and go or is it present all the time? °· Are there things that start the pain (eating certain foods, exercise)? °· Are there other symptoms associated with the pain (fever, nausea, vomiting, diarrhea)? °All of this is helpful to know when trying to find the cause of the pain. °CAUSES  °· Stomach: virus or bacteria infection, or ulcer. °· Intestine: appendicitis (inflamed appendix), regional ileitis (Crohn's disease), ulcerative colitis (inflamed colon), irritable bowel syndrome, diverticulitis (inflamed diverticulum of the colon), or cancer of the stomach or intestine. °· Gallbladder disease or stones in the gallbladder. °· Kidney disease, kidney stones, or infection. °· Pancreas infection or cancer. °· Fibromyalgia (pain disorder). °· Diseases of the female organs: °¨ Uterus: fibroid (non-cancerous) tumors or infection. °¨ Fallopian tubes: infection or tubal pregnancy. °¨ Ovary: cysts or tumors. °¨ Pelvic adhesions (scar tissue). °¨ Endometriosis (uterus lining tissue growing in the pelvis and on the pelvic organs). °¨ Pelvic congestion syndrome (female organs filling up with blood just before the menstrual period). °¨ Pain with the menstrual period. °¨ Pain with ovulation (producing an egg). °¨ Pain with an IUD (intrauterine device, birth control) in the uterus. °¨ Cancer of the female organs. °· Functional pain (pain not caused by a disease, may improve without treatment). °· Psychological pain. °· Depression. °DIAGNOSIS  °Your doctor will decide the seriousness of your pain by doing an examination. °· Blood tests. °· X-rays. °· Ultrasound. °· CT scan (computed tomography, special type of X-ray). °· MRI (magnetic resonance imaging). °· Cultures, for infection. °· Barium enema (dye inserted in the large intestine, to better view it with  X-rays). °· Colonoscopy (looking in intestine with a lighted tube). °· Laparoscopy (minor surgery, looking in abdomen with a lighted tube). °· Major abdominal exploratory surgery (looking in abdomen with a large incision). °TREATMENT  °The treatment will depend on the cause of the pain.  °· Many cases can be observed and treated at home. °· Over-the-counter medicines recommended by your caregiver. °· Prescription medicine. °· Antibiotics, for infection. °· Birth control pills, for painful periods or for ovulation pain. °· Hormone treatment, for endometriosis. °· Nerve blocking injections. °· Physical therapy. °· Antidepressants. °· Counseling with a psychologist or psychiatrist. °· Minor or major surgery. °HOME CARE INSTRUCTIONS  °· Do not take laxatives, unless directed by your caregiver. °· Take over-the-counter pain medicine only if ordered by your caregiver. Do not take aspirin because it can cause an upset stomach or bleeding. °· Try a clear liquid diet (broth or water) as ordered by your caregiver. Slowly move to a bland diet, as tolerated, if the pain is related to the stomach or intestine. °· Have a thermometer and take your temperature several times a day, and record it. °· Bed rest and sleep, if it helps the pain. °· Avoid sexual intercourse, if it causes pain. °· Avoid stressful situations. °· Keep your follow-up appointments and tests, as your caregiver orders. °· If the pain does not go away with medicine or surgery, you may try: °¨ Acupuncture. °¨ Relaxation exercises (yoga, meditation). °¨ Group therapy. °¨ Counseling. °SEEK MEDICAL CARE IF:  °· You notice certain foods cause stomach pain. °· Your home care treatment is not helping your pain. °· You need stronger pain medicine. °· You want your IUD removed. °· You feel faint or   lightheaded. °· You develop nausea and vomiting. °· You develop a rash. °· You are having side effects or an allergy to your medicine. °SEEK IMMEDIATE MEDICAL CARE IF:  °· Your  pain does not go away or gets worse. °· You have a fever. °· Your pain is felt only in portions of the abdomen. The right side could possibly be appendicitis. The left lower portion of the abdomen could be colitis or diverticulitis. °· You are passing blood in your stools (bright red or black tarry stools, with or without vomiting). °· You have blood in your urine. °· You develop chills, with or without a fever. °· You pass out. °MAKE SURE YOU:  °· Understand these instructions. °· Will watch your condition. °· Will get help right away if you are not doing well or get worse. °Document Released: 03/12/2007 Document Revised: 09/29/2013 Document Reviewed: 04/01/2009 °[employer]® Patient Information ©2015 [employer]. This information is not intended to replace advice given to you by your health care provider. Make sure you discuss any questions you have with your health care provider. ° °

## 2014-06-08 NOTE — ED Notes (Signed)
Patient transported to CT 

## 2014-06-08 NOTE — ED Notes (Addendum)
Pt states that last night she had upper abd pain/lower chest pain that started. She states that she took two pills and went to sleep. Pt states that she felt nauseated and had really bad headache.  Pt denies chest pain at this time only headache. Pt also c/o foul odor with her urine but denies any urinary problems x 2-3 days.

## 2014-06-18 ENCOUNTER — Encounter: Payer: Self-pay | Admitting: Physician Assistant

## 2014-06-18 ENCOUNTER — Ambulatory Visit (INDEPENDENT_AMBULATORY_CARE_PROVIDER_SITE_OTHER): Payer: Medicare Other | Admitting: Physician Assistant

## 2014-06-18 VITALS — BP 122/72 | HR 72 | Temp 97.7°F | Resp 16 | Ht 62.0 in | Wt 123.0 lb

## 2014-06-18 DIAGNOSIS — R1032 Left lower quadrant pain: Secondary | ICD-10-CM

## 2014-06-18 DIAGNOSIS — E559 Vitamin D deficiency, unspecified: Secondary | ICD-10-CM

## 2014-06-18 DIAGNOSIS — I6350 Cerebral infarction due to unspecified occlusion or stenosis of unspecified cerebral artery: Secondary | ICD-10-CM

## 2014-06-18 DIAGNOSIS — R42 Dizziness and giddiness: Secondary | ICD-10-CM

## 2014-06-18 DIAGNOSIS — F411 Generalized anxiety disorder: Secondary | ICD-10-CM

## 2014-06-18 DIAGNOSIS — N183 Chronic kidney disease, stage 3 unspecified: Secondary | ICD-10-CM

## 2014-06-18 DIAGNOSIS — R197 Diarrhea, unspecified: Secondary | ICD-10-CM

## 2014-06-18 DIAGNOSIS — I4891 Unspecified atrial fibrillation: Secondary | ICD-10-CM

## 2014-06-18 DIAGNOSIS — E1122 Type 2 diabetes mellitus with diabetic chronic kidney disease: Secondary | ICD-10-CM

## 2014-06-18 DIAGNOSIS — M25551 Pain in right hip: Secondary | ICD-10-CM

## 2014-06-18 DIAGNOSIS — I10 Essential (primary) hypertension: Secondary | ICD-10-CM

## 2014-06-18 DIAGNOSIS — I69920 Aphasia following unspecified cerebrovascular disease: Secondary | ICD-10-CM

## 2014-06-18 DIAGNOSIS — I639 Cerebral infarction, unspecified: Secondary | ICD-10-CM

## 2014-06-18 DIAGNOSIS — Z79899 Other long term (current) drug therapy: Secondary | ICD-10-CM

## 2014-06-18 DIAGNOSIS — E785 Hyperlipidemia, unspecified: Secondary | ICD-10-CM

## 2014-06-18 LAB — HEPATIC FUNCTION PANEL
ALBUMIN: 4 g/dL (ref 3.5–5.2)
ALT: 15 U/L (ref 0–35)
AST: 20 U/L (ref 0–37)
Alkaline Phosphatase: 72 U/L (ref 39–117)
BILIRUBIN DIRECT: 0.2 mg/dL (ref 0.0–0.3)
Indirect Bilirubin: 0.6 mg/dL (ref 0.2–1.2)
TOTAL PROTEIN: 6.5 g/dL (ref 6.0–8.3)
Total Bilirubin: 0.8 mg/dL (ref 0.2–1.2)

## 2014-06-18 LAB — LIPID PANEL
CHOL/HDL RATIO: 2.7 ratio
CHOLESTEROL: 129 mg/dL (ref 0–200)
HDL: 47 mg/dL (ref >39)
LDL Cholesterol: 52 mg/dL (ref 0–99)
TRIGLYCERIDES: 151 mg/dL — AB (ref <150)
VLDL: 30 mg/dL (ref 0–40)

## 2014-06-18 LAB — BASIC METABOLIC PANEL WITH GFR
BUN: 13 mg/dL (ref 6–23)
CO2: 31 mEq/L (ref 19–32)
Calcium: 9.8 mg/dL (ref 8.4–10.5)
Chloride: 101 mEq/L (ref 96–112)
Creat: 0.83 mg/dL (ref 0.50–1.10)
GFR, Est African American: 73 mL/min
GFR, Est Non African American: 64 mL/min
Glucose, Bld: 135 mg/dL — ABNORMAL HIGH (ref 70–99)
Potassium: 3.5 mEq/L (ref 3.5–5.3)
Sodium: 141 mEq/L (ref 135–145)

## 2014-06-18 LAB — MAGNESIUM: Magnesium: 1.8 mg/dL (ref 1.5–2.5)

## 2014-06-18 MED ORDER — METRONIDAZOLE 250 MG PO TABS: 250.0000 mg | ORAL_TABLET | Freq: Three times a day (TID) | ORAL | Status: DC

## 2014-06-18 MED ORDER — CIPROFLOXACIN HCL 500 MG PO TABS: 500.0000 mg | ORAL_TABLET | Freq: Two times a day (BID) | ORAL | Status: DC

## 2014-06-18 NOTE — Progress Notes (Signed)
Assessment and Plan:  Hypertension: Continue medication, monitor blood pressure at home. Continue DASH diet.  Reminder to go to the ER if any CP, SOB, nausea, dizziness, severe HA, changes vision/speech, left arm numbness and tingling and jaw pain. Cholesterol: Continue diet and exercise. Check cholesterol.  Diabetes with diabetic chronic kidney disease and with other circulatory complications-Continue diet and exercise. Check A1C Vitamin D Def- check level and continue medications.  Afib- follows next week to get coumadin checked, rate controlled AB pain with LLQ tenderness, diarrhea with blood-will put on cipro/flagyl for possible colitis/diverticulitis, get coumadin checked next week, will decrease dose for 1 week while on ABX, if pain continues needs CT/GI referral.  Dizziness/hypotension- especially while having diarrhea discontinue the HCTZ for the time being Right leg pain likely referred pain- hip full ROM, from bursitis vs back- conservative management for now.   Continue diet and meds as discussed. Further disposition pending results of labs. Discussed med's effects and SE's. Follow up 1 month.  OVER 40 minutes of exam, counseling, chart review.    HPI 81 y.o. female  presents for 3 month follow up with hypertension, hyperlipidemia, diabetes and vitamin D. Her husband is here with her today, she is a poor historian.  Her blood pressure has been controlled at home, today their BP is BP: 122/72 mmHg She does not workout. She denies chest pain, shortness of breath, dizziness.  She has a history of Afib and had a CVA in 2005, she has presistent dsypahasia and walks with a cane, she is on coumadin, she is on 1 tablet M,W,F, S and 1/2 tablet the rest of the days and follow with the coumadin clinic.  She is on cholesterol medication and denies myalgias. Her cholesterol is at goal. The cholesterol was:  02/17/2014: Cholesterol, Total 119; HDL Cholesterol by NMR 48; LDL (calc) 43; Triglycerides  141 She has not been working on diet and exercise for Diabetes with diabetic chronic kidney disease last GFR 74, she is not on bASA due to coumadin use, she is not on ACE/ARB due to allergy, and denies polydipsia, polyuria and visual disturbances. Last A1C was: 02/17/2014: Hemoglobin-A1c 6.7* Patient is on Vitamin D supplement. 02/17/2014: Vit D, 25-Hydroxy 101* She complains of right leg pain, takes hydrocodone and tylenol for it that helps occ.  She was in the ER Jan 11 for diarrhea with blood in it, had hypokalemia at that time, given fluid bolus and discharged. She continues to complain of lower AB pain with eating her evening meal. She has some nausea and dizziness with making the bed in the morning.  Lab Results  Component Value Date   CREATININE 0.74 06/08/2014   BUN 19 06/08/2014   NA 139 06/08/2014   K 3.2* 06/08/2014   CL 97 06/08/2014   CO2 32 06/08/2014    Current Medications:  Current Outpatient Prescriptions on File Prior to Visit  Medication Sig Dispense Refill  . acetaminophen (TYLENOL) 325 MG tablet Take 650 mg by mouth every 4 (four) hours as needed.      . Cholecalciferol (VITAMIN D) 2000 UNITS CAPS Take 1 capsule by mouth daily.     . hydrochlorothiazide (HYDRODIURIL) 25 MG tablet Take 1 tablet (25 mg total) by mouth daily. 90 tablet 6  . HYDROcodone-acetaminophen (NORCO/VICODIN) 5-325 MG per tablet Take 1-2 tablets by mouth every 4 (four) hours as needed. 60 tablet 0  . metoprolol (LOPRESSOR) 50 MG tablet TAKE ONE TABLET BY MOUTH TWICE DAILY 60 tablet 99  .  pantoprazole (PROTONIX) 40 MG tablet TAKE ONE TABLET BY MOUTH ONCE DAILY FOR ACID INDIGESTION. 30 tablet 3  . polyethylene glycol (MIRALAX / GLYCOLAX) packet Take 17 g by mouth daily. (Patient taking differently: Take 17 g by mouth daily as needed for moderate constipation or severe constipation. ) 14 each 1  . ranitidine (ZANTAC) 300 MG tablet Take 1 tablet (300 mg total) by mouth 2 (two) times daily. 60 tablet 3  .  rosuvastatin (CRESTOR) 20 MG tablet Take 1 tablet (20 mg total) by mouth every evening. 90 tablet 3  . triamcinolone cream (KENALOG) 0.1 % Apply 1 application topically 2 (two) times daily. To skin rash 30 g 3  . warfarin (COUMADIN) 2 MG tablet TAKE AS DIRECTED BY ANTICOAGULATION CLINIC 35 tablet 3   No current facility-administered medications on file prior to visit.   Medical History:  Past Medical History  Diagnosis Date  . Atrial fibrillation   . Aphasia as late effect of cerebrovascular accident   . CVA (cerebral infarction)   . Hyperlipidemia   . HTN (hypertension)   . Anxiety   . Diabetes mellitus type II   . Hiatal hernia   . GERD (gastroesophageal reflux disease)   . IBS (irritable bowel syndrome)   . Vitamin D deficiency   . Stroke    Allergies:  Allergies  Allergen Reactions  . Ace Inhibitors     Cough  . Fosamax [Alendronate Sodium]     Heart burn  . Norvasc [Amlodipine Besylate]     edema  . Zocor [Simvastatin]     Elevated CPK    Review of Systems:  Review of Systems  Constitutional: Positive for malaise/fatigue. Negative for fever, chills, weight loss and diaphoresis.  HENT: Negative.   Eyes: Negative.   Respiratory: Negative.   Cardiovascular: Negative.   Gastrointestinal: Positive for nausea, abdominal pain (LLQ), diarrhea and blood in stool (x 1). Negative for heartburn, vomiting, constipation and melena.  Genitourinary: Negative.   Musculoskeletal: Positive for back pain, joint pain (right hip) and falls (in August on right hip, normal Xrays). Negative for myalgias and neck pain.  Skin: Negative.   Neurological: Positive for dizziness and weakness. Negative for tingling, tremors, sensory change, speech change, focal weakness, seizures and loss of consciousness.  Psychiatric/Behavioral: Negative.     Family history- Review and unchanged Social history- Review and unchanged Physical Exam: BP 122/72 mmHg  Pulse 72  Temp(Src) 97.7 F (36.5 C)   Resp 16  Ht  (1.575 m)  Wt 123 lb (55.792 kg)  BMI 22.49 kg/m2 Wt Readings from Last 3 Encounters:  06/18/14 123 lb (55.792 kg)  02/17/14 123 lb (55.792 kg)  01/22/14 120 lb (54.432 kg)   General Appearance: well nourished, no acute distress Eyes: PERRLA, EOMs, conjunctiva no swelling or erythema Sinuses: No Frontal/maxillary tenderness ENT/Mouth: Ext aud canals clear, TMs without erythema, bulging. No erythema, swelling, or exudate on post pharynx.  Tonsils not swollen or erythematous.  Neck: Supple, thyroid normal.  Respiratory: Respiratory effort normal, BS equal bilaterally without rales, rhonchi, wheezing or stridor.  Cardio: Irreg, Irreg with no MRGs. Decrease bilateral pulses with minimal edema.  Abdomen: Soft, decreased BS, + LLQ tenderness with guarding without rebound, hernias, masses. Lymphatics: Non tender without lymphadenopathy.  Musculoskeletal: Full ROM bilateral hips without pain, + greater trochanteric right tenderness with palpation, 4/5 strength, gait unsteady with cane Skin: Warm, dry without rashes, lesions, ecchymosis.  Neuro: Cranial nerves intact. No cerebellar symptoms.  Psych: Awake and oriented  X 2, normal affect.    Quentin Mullingollier, Amanda, PA-C 10:17 AM [facility]

## 2014-06-18 NOTE — Patient Instructions (Signed)
Please stop the fluid pill, hydrochlorothiazide for 1 week OR you can take it every other day if she starts to get swelling in her legs. This pill is causing dizziness.   Please get on antbiotics given, I think you may have an infection in your intestines but the antbiotics I'm putting you on can increase your coumadin level so please decrease your dose like this:  Take 1 pill Monday and Wednesday but 1/2 pill the other days and follow up with the coumadin clinic. Call them and let them know that you were put on antibiotics for possible diverticulitis/colon infection.   Hip pain is like bursitis or lower back pain.  If not better we can refer you.   Sciatica with Rehab The sciatic nerve runs from the back down the leg and is responsible for sensation and control of the muscles in the back (posterior) side of the thigh, lower leg, and foot. Sciatica is a condition that is characterized by inflammation of this nerve.  SYMPTOMS   Signs of nerve damage, including numbness and/or weakness along the posterior side of the lower extremity.  Pain in the back of the thigh that may also travel down the leg.  Pain that worsens when sitting for long periods of time.  Occasionally, pain in the back or buttock. CAUSES  Inflammation of the sciatic nerve is the cause of sciatica. The inflammation is due to something irritating the nerve. Common sources of irritation include:  Sitting for long periods of time.  Direct trauma to the nerve.  Arthritis of the spine.  Herniated or ruptured disk.  Slipping of the vertebrae (spondylolisthesis).  Pressure from soft tissues, such as muscles or ligament-like tissue (fascia). RISK INCREASES WITH:  Sports that place pressure or stress on the spine (football or weightlifting).  Poor strength and flexibility.  Failure to warm up properly before activity.  Family history of low back pain or disk disorders.  Previous back injury or surgery.  Poor body  mechanics, especially when lifting, or poor posture. PREVENTION   Warm up and stretch properly before activity.  Maintain physical fitness:  Strength, flexibility, and endurance.  Cardiovascular fitness.  Learn and use proper technique, especially with posture and lifting. When possible, have coach correct improper technique.  Avoid activities that place stress on the spine. PROGNOSIS If treated properly, then sciatica usually resolves within 6 weeks. However, occasionally surgery is necessary.  RELATED COMPLICATIONS   Permanent nerve damage, including pain, numbness, tingle, or weakness.  Chronic back pain.  Risks of surgery: infection, bleeding, nerve damage, or damage to surrounding tissues. TREATMENT Treatment initially involves resting from any activities that aggravate your symptoms. The use of ice and medication may help reduce pain and inflammation. The use of strengthening and stretching exercises may help reduce pain with activity. These exercises may be performed at home or with referral to a therapist. A therapist may recommend further treatments, such as transcutaneous electronic nerve stimulation (TENS) or ultrasound. Your caregiver may recommend corticosteroid injections to help reduce inflammation of the sciatic nerve. If symptoms persist despite non-surgical (conservative) treatment, then surgery may be recommended. MEDICATION  If pain medication is necessary, then nonsteroidal anti-inflammatory medications, such as aspirin and ibuprofen, or other minor pain relievers, such as acetaminophen, are often recommended.  Do not take pain medication for 7 days before surgery.  Prescription pain relievers may be given if deemed necessary by your caregiver. Use only as directed and only as much as you need.  Ointments applied  to the skin may be helpful.  Corticosteroid injections may be given by your caregiver. These injections should be reserved for the most serious cases,  because they may only be given a certain number of times. HEAT AND COLD  Cold treatment (icing) relieves pain and reduces inflammation. Cold treatment should be applied for 10 to 15 minutes every 2 to 3 hours for inflammation and pain and immediately after any activity that aggravates your symptoms. Use ice packs or massage the area with a piece of ice (ice massage).  Heat treatment may be used prior to performing the stretching and strengthening activities prescribed by your caregiver, physical therapist, or athletic trainer. Use a heat pack or soak the injury in warm water. SEEK MEDICAL CARE IF:  Treatment seems to offer no benefit, or the condition worsens.  Any medications produce adverse side effects. EXERCISES  RANGE OF MOTION (ROM) AND STRETCHING EXERCISES - Sciatica Most people with sciatic will find that their symptoms worsen with either excessive bending forward (flexion) or arching at the low back (extension). The exercises which will help resolve your symptoms will focus on the opposite motion. Your physician, physical therapist or athletic trainer will help you determine which exercises will be most helpful to resolve your low back pain. Do not complete any exercises without first consulting with your clinician. Discontinue any exercises which worsen your symptoms until you speak to your clinician. If you have pain, numbness or tingling which travels down into your buttocks, leg or foot, the goal of the therapy is for these symptoms to move closer to your back and eventually resolve. Occasionally, these leg symptoms will get better, but your low back pain may worsen; this is typically an indication of progress in your rehabilitation. Be certain to be very alert to any changes in your symptoms and the activities in which you participated in the 24 hours prior to the change. Sharing this information with your clinician will allow him/her to most efficiently treat your condition. These  exercises may help you when beginning to rehabilitate your injury. Your symptoms may resolve with or without further involvement from your physician, physical therapist or athletic trainer. While completing these exercises, remember:   Restoring tissue flexibility helps normal motion to return to the joints. This allows healthier, less painful movement and activity.  An effective stretch should be held for at least 30 seconds.  A stretch should never be painful. You should only feel a gentle lengthening or release in the stretched tissue. FLEXION RANGE OF MOTION AND STRETCHING EXERCISES: STRETCH - Flexion, Single Knee to Chest   Lie on a firm bed or floor with both legs extended in front of you.  Keeping one leg in contact with the floor, bring your opposite knee to your chest. Hold your leg in place by either grabbing behind your thigh or at your knee.  Pull until you feel a gentle stretch in your low back. Hold __________ seconds.  Slowly release your grasp and repeat the exercise with the opposite side. Repeat __________ times. Complete this exercise __________ times per day.  STRETCH - Flexion, Double Knee to Chest  Lie on a firm bed or floor with both legs extended in front of you.  Keeping one leg in contact with the floor, bring your opposite knee to your chest.  Tense your stomach muscles to support your back and then lift your other knee to your chest. Hold your legs in place by either grabbing behind your thighs or at  your knees.  Pull both knees toward your chest until you feel a gentle stretch in your low back. Hold __________ seconds.  Tense your stomach muscles and slowly return one leg at a time to the floor. Repeat __________ times. Complete this exercise __________ times per day.  STRETCH - Low Trunk Rotation   Lie on a firm bed or floor. Keeping your legs in front of you, bend your knees so they are both pointed toward the ceiling and your feet are flat on the  floor.  Extend your arms out to the side. This will stabilize your upper body by keeping your shoulders in contact with the floor.  Gently and slowly drop both knees together to one side until you feel a gentle stretch in your low back. Hold for __________ seconds.  Tense your stomach muscles to support your low back as you bring your knees back to the starting position. Repeat the exercise to the other side. Repeat __________ times. Complete this exercise __________ times per day  EXTENSION RANGE OF MOTION AND FLEXIBILITY EXERCISES: STRETCH - Extension, Prone on Elbows  Lie on your stomach on the floor, a bed will be too soft. Place your palms about shoulder width apart and at the height of your head.  Place your elbows under your shoulders. If this is too painful, stack pillows under your chest.  Allow your body to relax so that your hips drop lower and make contact more completely with the floor.  Hold this position for __________ seconds.  Slowly return to lying flat on the floor. Repeat __________ times. Complete this exercise __________ times per day.  RANGE OF MOTION - Extension, Prone Press Ups  Lie on your stomach on the floor, a bed will be too soft. Place your palms about shoulder width apart and at the height of your head.  Keeping your back as relaxed as possible, slowly straighten your elbows while keeping your hips on the floor. You may adjust the placement of your hands to maximize your comfort. As you gain motion, your hands will come more underneath your shoulders.  Hold this position __________ seconds.  Slowly return to lying flat on the floor. Repeat __________ times. Complete this exercise __________ times per day.  STRENGTHENING EXERCISES - Sciatica  These exercises may help you when beginning to rehabilitate your injury. These exercises should be done near your "sweet spot." This is the neutral, low-back arch, somewhere between fully rounded and fully arched,  that is your least painful position. When performed in this safe range of motion, these exercises can be used for people who have either a flexion or extension based injury. These exercises may resolve your symptoms with or without further involvement from your physician, physical therapist or athletic trainer. While completing these exercises, remember:   Muscles can gain both the endurance and the strength needed for everyday activities through controlled exercises.  Complete these exercises as instructed by your physician, physical therapist or athletic trainer. Progress with the resistance and repetition exercises only as your caregiver advises.  You may experience muscle soreness or fatigue, but the pain or discomfort you are trying to eliminate should never worsen during these exercises. If this pain does worsen, stop and make certain you are following the directions exactly. If the pain is still present after adjustments, discontinue the exercise until you can discuss the trouble with your clinician. STRENGTHENING - Deep Abdominals, Pelvic Tilt   Lie on a firm bed or floor. Keeping your legs  in front of you, bend your knees so they are both pointed toward the ceiling and your feet are flat on the floor.  Tense your lower abdominal muscles to press your low back into the floor. This motion will rotate your pelvis so that your tail bone is scooping upwards rather than pointing at your feet or into the floor.  With a gentle tension and even breathing, hold this position for __________ seconds. Repeat __________ times. Complete this exercise __________ times per day.  STRENGTHENING - Abdominals, Crunches   Lie on a firm bed or floor. Keeping your legs in front of you, bend your knees so they are both pointed toward the ceiling and your feet are flat on the floor. Cross your arms over your chest.  Slightly tip your chin down without bending your neck.  Tense your abdominals and slowly lift  your trunk high enough to just clear your shoulder blades. Lifting higher can put excessive stress on the low back and does not further strengthen your abdominal muscles.  Control your return to the starting position. Repeat __________ times. Complete this exercise __________ times per day.  STRENGTHENING - Quadruped, Opposite UE/LE Lift  Assume a hands and knees position on a firm surface. Keep your hands under your shoulders and your knees under your hips. You may place padding under your knees for comfort.  Find your neutral spine and gently tense your abdominal muscles so that you can maintain this position. Your shoulders and hips should form a rectangle that is parallel with the floor and is not twisted.  Keeping your trunk steady, lift your right hand no higher than your shoulder and then your left leg no higher than your hip. Make sure you are not holding your breath. Hold this position __________ seconds.  Continuing to keep your abdominal muscles tense and your back steady, slowly return to your starting position. Repeat with the opposite arm and leg. Repeat __________ times. Complete this exercise __________ times per day.  STRENGTHENING - Abdominals and Quadriceps, Straight Leg Raise   Lie on a firm bed or floor with both legs extended in front of you.  Keeping one leg in contact with the floor, bend the other knee so that your foot can rest flat on the floor.  Find your neutral spine, and tense your abdominal muscles to maintain your spinal position throughout the exercise.  Slowly lift your straight leg off the floor about 6 inches for a count of 15, making sure to not hold your breath.  Still keeping your neutral spine, slowly lower your leg all the way to the floor. Repeat this exercise with each leg __________ times. Complete this exercise __________ times per day. POSTURE AND BODY MECHANICS CONSIDERATIONS - Sciatica Keeping correct posture when sitting, standing or  completing your activities will reduce the stress put on different body tissues, allowing injured tissues a chance to heal and limiting painful experiences. The following are general guidelines for improved posture. Your physician or physical therapist will provide you with any instructions specific to your needs. While reading these guidelines, remember:  The exercises prescribed by your provider will help you have the flexibility and strength to maintain correct postures.  The correct posture provides the optimal environment for your joints to work. All of your joints have less wear and tear when properly supported by a spine with good posture. This means you will experience a healthier, less painful body.  Correct posture must be practiced with all of your  activities, especially prolonged sitting and standing. Correct posture is as important when doing repetitive low-stress activities (typing) as it is when doing a single heavy-load activity (lifting). RESTING POSITIONS Consider which positions are most painful for you when choosing a resting position. If you have pain with flexion-based activities (sitting, bending, stooping, squatting), choose a position that allows you to rest in a less flexed posture. You would want to avoid curling into a fetal position on your side. If your pain worsens with extension-based activities (prolonged standing, working overhead), avoid resting in an extended position such as sleeping on your stomach. Most people will find more comfort when they rest with their spine in a more neutral position, neither too rounded nor too arched. Lying on a non-sagging bed on your side with a pillow between your knees, or on your back with a pillow under your knees will often provide some relief. Keep in mind, being in any one position for a prolonged period of time, no matter how correct your posture, can still lead to stiffness. PROPER SITTING POSTURE In order to minimize stress and  discomfort on your spine, you must sit with correct posture Sitting with good posture should be effortless for a healthy body. Returning to good posture is a gradual process. Many people can work toward this most comfortably by using various supports until they have the flexibility and strength to maintain this posture on their own. When sitting with proper posture, your ears will fall over your shoulders and your shoulders will fall over your hips. You should use the back of the chair to support your upper back. Your low back will be in a neutral position, just slightly arched. You may place a small pillow or folded towel at the base of your low back for support.  When working at a desk, create an environment that supports good, upright posture. Without extra support, muscles fatigue and lead to excessive strain on joints and other tissues. Keep these recommendations in mind: CHAIR:   A chair should be able to slide under your desk when your back makes contact with the back of the chair. This allows you to work closely.  The chair's height should allow your eyes to be level with the upper part of your monitor and your hands to be slightly lower than your elbows. BODY POSITION  Your feet should make contact with the floor. If this is not possible, use a foot rest.  Keep your ears over your shoulders. This will reduce stress on your neck and low back. INCORRECT SITTING POSTURES   If you are feeling tired and unable to assume a healthy sitting posture, do not slouch or slump. This puts excessive strain on your back tissues, causing more damage and pain. Healthier options include:  Using more support, like a lumbar pillow.  Switching tasks to something that requires you to be upright or walking.  Talking a brief walk.  Lying down to rest in a neutral-spine position. PROLONGED STANDING WHILE SLIGHTLY LEANING FORWARD  When completing a task that requires you to lean forward while standing in one  place for a long time, place either foot up on a stationary 2-4 inch high object to help maintain the best posture. When both feet are on the ground, the low back tends to lose its slight inward curve. If this curve flattens (or becomes too large), then the back and your other joints will experience too much stress, fatigue more quickly and can cause pain.  CORRECT  STANDING POSTURES Proper standing posture should be assumed with all daily activities, even if they only take a few moments, like when brushing your teeth. As in sitting, your ears should fall over your shoulders and your shoulders should fall over your hips. You should keep a slight tension in your abdominal muscles to brace your spine. Your tailbone should point down to the ground, not behind your body, resulting in an over-extended swayback posture.  INCORRECT STANDING POSTURES  Common incorrect standing postures include a forward head, locked knees and/or an excessive swayback. WALKING Walk with an upright posture. Your ears, shoulders and hips should all line-up. PROLONGED ACTIVITY IN A FLEXED POSITION When completing a task that requires you to bend forward at your waist or lean over a low surface, try to find a way to stabilize 3 of 4 of your limbs. You can place a hand or elbow on your thigh or rest a knee on the surface you are reaching across. This will provide you more stability so that your muscles do not fatigue as quickly. By keeping your knees relaxed, or slightly bent, you will also reduce stress across your low back. CORRECT LIFTING TECHNIQUES DO :   Assume a wide stance. This will provide you more stability and the opportunity to get as close as possible to the object which you are lifting.  Tense your abdominals to brace your spine; then bend at the knees and hips. Keeping your back locked in a neutral-spine position, lift using your leg muscles. Lift with your legs, keeping your back straight.  Test the weight of  unknown objects before attempting to lift them.  Try to keep your elbows locked down at your sides in order get the best strength from your shoulders when carrying an object.  Always ask for help when lifting heavy or awkward objects. INCORRECT LIFTING TECHNIQUES DO NOT:   Lock your knees when lifting, even if it is a small object.  Bend and twist. Pivot at your feet or move your feet when needing to change directions.  Assume that you cannot safely pick up a paperclip without proper posture. Document Released: 05/15/2005 Document Revised: 09/29/2013 Document Reviewed: 08/27/2008 [facility] Patient Information 2015 [place], [place]. This information is not intended to replace advice given to you by your health care provider. Make sure you discuss any questions you have with your health care provider.

## 2014-06-19 LAB — CBC WITH DIFFERENTIAL/PLATELET
Basophils Absolute: 0.1 10*3/uL (ref 0.0–0.1)
Basophils Relative: 1 % (ref 0–1)
Eosinophils Absolute: 0.2 10*3/uL (ref 0.0–0.7)
Eosinophils Relative: 3 % (ref 0–5)
HEMATOCRIT: 44 % (ref 36.0–46.0)
HEMOGLOBIN: 14.9 g/dL (ref 12.0–15.0)
LYMPHS PCT: 39 % (ref 12–46)
Lymphs Abs: 2.7 10*3/uL (ref 0.7–4.0)
MCH: 31.9 pg (ref 26.0–34.0)
MCHC: 33.9 g/dL (ref 30.0–36.0)
MCV: 94.2 fL (ref 78.0–100.0)
MPV: 9.8 fL (ref 8.6–12.4)
Monocytes Absolute: 0.7 10*3/uL (ref 0.1–1.0)
Monocytes Relative: 10 % (ref 3–12)
NEUTROS ABS: 3.2 10*3/uL (ref 1.7–7.7)
Neutrophils Relative %: 47 % (ref 43–77)
PLATELETS: 226 10*3/uL (ref 150–400)
RBC: 4.67 MIL/uL (ref 3.87–5.11)
RDW: 12.9 % (ref 11.5–15.5)
WBC: 6.9 10*3/uL (ref 4.0–10.5)

## 2014-06-19 LAB — HEMOGLOBIN A1C
HEMOGLOBIN A1C: 6.7 % — AB (ref <5.7)
Mean Plasma Glucose: 146 mg/dL — ABNORMAL HIGH (ref <117)

## 2014-06-19 LAB — VITAMIN D 25 HYDROXY (VIT D DEFICIENCY, FRACTURES): VIT D 25 HYDROXY: 66 ng/mL (ref 30–100)

## 2014-06-19 LAB — TSH: TSH: 0.789 u[IU]/mL (ref 0.350–4.500)

## 2014-06-24 ENCOUNTER — Encounter: Payer: Self-pay | Admitting: Cardiology

## 2014-06-24 ENCOUNTER — Ambulatory Visit (INDEPENDENT_AMBULATORY_CARE_PROVIDER_SITE_OTHER): Payer: Medicare Other | Admitting: *Deleted

## 2014-06-24 ENCOUNTER — Ambulatory Visit (INDEPENDENT_AMBULATORY_CARE_PROVIDER_SITE_OTHER): Payer: Medicare Other | Admitting: Cardiology

## 2014-06-24 VITALS — BP 138/86 | HR 64 | Ht 62.0 in | Wt 125.8 lb

## 2014-06-24 DIAGNOSIS — I4891 Unspecified atrial fibrillation: Secondary | ICD-10-CM

## 2014-06-24 DIAGNOSIS — I10 Essential (primary) hypertension: Secondary | ICD-10-CM

## 2014-06-24 DIAGNOSIS — Z5181 Encounter for therapeutic drug level monitoring: Secondary | ICD-10-CM

## 2014-06-24 DIAGNOSIS — I481 Persistent atrial fibrillation: Secondary | ICD-10-CM

## 2014-06-24 DIAGNOSIS — E785 Hyperlipidemia, unspecified: Secondary | ICD-10-CM

## 2014-06-24 DIAGNOSIS — I4819 Other persistent atrial fibrillation: Secondary | ICD-10-CM

## 2014-06-24 DIAGNOSIS — I639 Cerebral infarction, unspecified: Secondary | ICD-10-CM

## 2014-06-24 DIAGNOSIS — I6350 Cerebral infarction due to unspecified occlusion or stenosis of unspecified cerebral artery: Secondary | ICD-10-CM

## 2014-06-24 DIAGNOSIS — I5031 Acute diastolic (congestive) heart failure: Secondary | ICD-10-CM

## 2014-06-24 LAB — POCT INR: INR: 2

## 2014-06-24 MED ORDER — FUROSEMIDE 20 MG PO TABS: 20.0000 mg | ORAL_TABLET | Freq: Every day | ORAL | Status: DC

## 2014-06-24 MED ORDER — POTASSIUM CHLORIDE CRYS ER 10 MEQ PO TBCR: 10.0000 meq | EXTENDED_RELEASE_TABLET | Freq: Every day | ORAL | Status: DC

## 2014-06-24 NOTE — Patient Instructions (Signed)
Your physician has recommended you make the following change in your medication:   STOP TAKING HCTZ NOW  START TAKING LASIX 20 MG ONCE DAILY  START TAKING POTASSIUM CHLORIDE 10 mEq ONCE DAILY    Your physician recommends that you schedule a follow-up appointment in: ONE MONTH WITH DR Delton SeeNELSON

## 2014-06-24 NOTE — Progress Notes (Signed)
Patient ID: Lisa Hopkins, female   DOB: [birth year], 81 y.o.   MRN: [id number] Patient ID: Lisa Hopkins, female   DOB: 1925-01-10, 81 y.o.   MRN: [id number]     Patient Name: Lisa Hopkins Date of Encounter: 06/24/2014  Primary Care Provider:  Nadean Corwin, MD Primary Cardiologist:  Lars Masson   Problem List   Past Medical History  Diagnosis Date  . Atrial fibrillation   . Aphasia as late effect of cerebrovascular accident   . CVA (cerebral infarction)   . Hyperlipidemia   . HTN (hypertension)   . Anxiety   . Diabetes mellitus type II   . Hiatal hernia   . GERD (gastroesophageal reflux disease)   . IBS (irritable bowel syndrome)   . Vitamin D deficiency   . Stroke    Past Surgical History  Procedure Laterality Date  . Upper gastrointestinal endoscopy  2005  . Colonoscopy  2005  . Cholecystectomy    . Cataract extraction, bilateral    . Tonsillectomy      Allergies  Allergies  Allergen Reactions  . Ace Inhibitors     Cough  . Fosamax [Alendronate Sodium]     Heart burn  . Norvasc [Amlodipine Besylate]     edema  . Zocor [Simvastatin]     Elevated CPK    HPI  Lisa Hopkins is a pleasant 81 year old female with h/o chronic A. Fib and anticoagulation post CVA in 2010. She has been stable since then, no recurrent stroke, no DOE, CP, LE edema or DOE or palpitations.  She very compliant with her medications. She is complaining of lower abdominal pain that progresses to epigastric pain and retrosternal burning. She used to experience it daily after dinner, but recently it has become less frequent. She denies change in apettite, diarhea, bloody or black tarry stools.  She has h/o GERD and acute ischemic colitis 2 years ago, it seems that she didn't follow with a gastroenterologist since then.  06/24/2014 - the patient had an episode of colitis 1 week ago, for which she received metronidazole and ciprofloxacine. She has noticed LE edema  since she started it. IT was discontinued. She has noticed worsening SOB, no chest pain, no palpitations. Two falls in the last year, both mechanical. Complaint with her meds.   Home Medications  Prior to Admission medications   Medication Sig Start Date End Date Taking? Authorizing Provider  acetaminophen (TYLENOL) 325 MG tablet Take 650 mg by mouth every 4 (four) hours as needed.     Yes Historical Provider, MD  alum hydroxide-mag trisilicate (ANTACID) 80-20 MG CHEW Chew 1 tablet by mouth 3 (three) times daily as needed.   Yes Historical Provider, MD  Cholecalciferol (VITAMIN D) 2000 UNITS CAPS Take 2 capsules by mouth daily.    Yes Historical Provider, MD  hydrochlorothiazide (HYDRODIURIL) 25 MG tablet TAKE ONE TABLET BY MOUTH IN THE MORNING FOR BLOOD PRESSURE AND FLUID 05/30/13  Yes Lucky Cowboy, MD  HYDROcodone-acetaminophen (NORCO/VICODIN) 5-325 MG per tablet Take 1-2 tablets by mouth every 4 (four) hours as needed. 03/17/12  Yes Dorothea Ogle, MD  metoprolol (LOPRESSOR) 50 MG tablet Take 50 mg by mouth 2 (two) times daily.     Yes Historical Provider, MD  pantoprazole (PROTONIX) 40 MG tablet Take 1 tablet (40 mg total) by mouth daily. 03/17/12  Yes Dorothea Ogle, MD  polyethylene glycol (MIRALAX / Ethelene Hal) packet Take 17 g by mouth daily. 03/17/12  Yes Iskra M  Izola Price, MD  rosuvastatin (CRESTOR) 20 MG tablet Take 1 tablet (20 mg total) by mouth every evening. 05/26/13  Yes Quentin Mulling, PA-C  warfarin (COUMADIN) 2 MG tablet Take as directed by anticoagulation clinic 12/24/12  Yes Gaylord Shih, MD    Family History  Family History  Problem Relation Age of Onset  . Diabetes Mother   . Stroke Mother   . Hypertension Brother   . Heart disease Brother   . Arthritis Sister     Rhematoid  . Arthritis Sister     Rhematoid    Social History  History   Social History  . Marital Status: Married    Spouse Name: N/A    Number of Children: N/A  . Years of Education: N/A    Occupational History  . Not on file.   Social History Main Topics  . Smoking status: Never Smoker   . Smokeless tobacco: Never Used  . Alcohol Use: No  . Drug Use: No  . Sexual Activity: No   Other Topics Concern  . Not on file   Social History Narrative     Review of Systems, as per HPI, otherwise negative General:  No chills, fever, night sweats or weight changes.  Cardiovascular:  No chest pain, dyspnea on exertion, edema, orthopnea, palpitations, paroxysmal nocturnal dyspnea. Dermatological: No rash, lesions/masses Respiratory: No cough, dyspnea Urologic: No hematuria, dysuria Abdominal:   No nausea, vomiting, diarrhea, bright red blood per rectum, melena, or hematemesis Neurologic:  No visual changes, wkns, changes in mental status. All other systems reviewed and are otherwise negative except as noted above.  Physical Exam  Blood pressure 138/86, pulse 64, height 5\' 2"  (1.575 m), weight 125 lb 12.8 oz (57.063 kg).  General: Pleasant, NAD Psych: Normal affect. Neuro: Alert and oriented X 3. Moves all extremities spontaneously. HEENT: Normal  Neck: Supple without bruits or JVD. Lungs:  Resp regular and unlabored, CTA. Heart: RRR no s3, s4, or murmurs. Abdomen: Soft, non-tender, non-distended, BS + x 4.  Extremities: No clubbing, cyanosis or edema. DP/PT/Radials 2+ and equal bilaterally.  Labs:  No results for input(s): CKTOTAL, CKMB, TROPONINI in the last 72 hours. Lab Results  Component Value Date   WBC 6.9 06/18/2014   HGB 14.9 06/18/2014   HCT 44.0 06/18/2014   MCV 94.2 06/18/2014   PLT 226 06/18/2014     Recent Labs Lab 06/18/14 1039  NA 141  K 3.5  CL 101  CO2 31  BUN 13  CREATININE 0.83  CALCIUM 9.8  PROT 6.5  BILITOT 0.8  ALKPHOS 72  ALT 15  AST 20  GLUCOSE 135*   Lab Results  Component Value Date   CHOL 129 06/18/2014   HDL 47 06/18/2014   LDLCALC 52 06/18/2014   TRIG 151* 06/18/2014   No results found for: DDIMER Invalid  input(s): POCBNP  Accessory Clinical Findings  echocardiogram  ECG - Atrial fibrillation, non-specific ST-T wave changes  Procedures/Studies:  Ct Abdomen Pelvis W Contrast  03/12/2012  IMPRESSION:  1. Marked wall thickening and mucosal enhancement in the descending and sigmoid colon, compatible with severe colitis. Surrounding stranding and mild ascites. The celiac trunk and mesenteric arteries appear patent, and no intramural gas is observed in the colon. This colitis is probably infectious rather than ischemic, and if the patient has been on recent antibiotics then checking for C difficile toxins may be warranted. I doubt that the appearance is due to bowel wall hematoma.  2. Large hiatal hernia, chronic.  Echocardiogram 02/22/2013  - Left ventricle: The cavity size was normal. Systolic function was normal. The estimated ejection fraction was in the range of 55% to 60%. Wall motion was normal; there were no regional wall motion abnormalities. Doppler parameters are consistent with abnormal left ventricular relaxation (grade 1 diastolic dysfunction). Doppler parameters are consistent with elevated mean left atrial filling pressure. - Aortic valve: Mild regurgitation. - Mitral valve: Moderately calcified annulus. Mild regurgitation. - Right ventricle: Systolic pressure was increased. - Right atrium: The atrium was mildly dilated. - Tricuspid valve: Mild regurgitation. - Pulmonary arteries: PA peak pressure: 46mm Hg (S). Impressions:  - The right ventricular systolic pressure was increased consistent with moderate pulmonary hypertension.    Assessment & Plan  1. Chronic a-fib, h/o CVA on chronic anticoagulation with coumadine - rate controlled, complaint to her meds, no more strokes  2. Acute diastolic CHF - D/C HCTZ and start Lasix 20 mg po daily with KCL 10 mEq daily K 3.5 on lab on 06/18/2014).  3. Hypertension - well controlled  4. Hyperlipidemia - at goal on labs  from 06/18/2014.  5. Pulmonary hypertension - the patient is asymptomatic and well compensated  6. Abdominal pain - episodes of colitis, s/p ATB treatment 1 week ago.   Follow up in 1 month.  Lars Masson, MD, [facility] 06/24/2014, 12:28 PM

## 2014-06-26 ENCOUNTER — Telehealth: Payer: Self-pay

## 2014-06-26 NOTE — Telephone Encounter (Signed)
Per Quentin MullingAmanda Collier, PA , returned call to patients husband regarding medication instructions, no answer, left detailed message on machine, advised that patient can d/c cipro and flagyl, continue on the lasix and coumadin as prescribed by cardiology and instructed, advised do not take the HCTZ only the lasix and advised that we will have her follow up with GI

## 2014-06-26 NOTE — Telephone Encounter (Signed)
Received paper note  from front office staff, husband called wanted instructions regarding medications, I attempted to return call and there was no answer, lmom for Fayrene FearingJames to return call, advised him that he can call back or after hours for instructions if needed.

## 2014-07-02 ENCOUNTER — Ambulatory Visit (INDEPENDENT_AMBULATORY_CARE_PROVIDER_SITE_OTHER): Payer: Medicare Other | Admitting: Internal Medicine

## 2014-07-02 ENCOUNTER — Encounter: Payer: Self-pay | Admitting: Internal Medicine

## 2014-07-02 ENCOUNTER — Ambulatory Visit: Payer: Self-pay | Admitting: Internal Medicine

## 2014-07-02 VITALS — BP 116/78 | HR 72 | Temp 98.1°F | Resp 16 | Ht 62.0 in | Wt 126.4 lb

## 2014-07-02 DIAGNOSIS — R7303 Prediabetes: Secondary | ICD-10-CM

## 2014-07-02 DIAGNOSIS — Z5181 Encounter for therapeutic drug level monitoring: Secondary | ICD-10-CM

## 2014-07-02 DIAGNOSIS — R7309 Other abnormal glucose: Secondary | ICD-10-CM

## 2014-07-02 DIAGNOSIS — R609 Edema, unspecified: Secondary | ICD-10-CM

## 2014-07-02 DIAGNOSIS — I10 Essential (primary) hypertension: Secondary | ICD-10-CM

## 2014-07-02 LAB — BASIC METABOLIC PANEL WITH GFR
BUN: 14 mg/dL (ref 6–23)
CALCIUM: 9.6 mg/dL (ref 8.4–10.5)
CO2: 30 mEq/L (ref 19–32)
Chloride: 103 mEq/L (ref 96–112)
Creat: 0.83 mg/dL (ref 0.50–1.10)
GFR, EST NON AFRICAN AMERICAN: 64 mL/min
GFR, Est African American: 73 mL/min
Glucose, Bld: 115 mg/dL — ABNORMAL HIGH (ref 70–99)
Potassium: 3.7 mEq/L (ref 3.5–5.3)
SODIUM: 141 meq/L (ref 135–145)

## 2014-07-02 LAB — CBC WITH DIFFERENTIAL/PLATELET
BASOS ABS: 0.1 10*3/uL (ref 0.0–0.1)
Basophils Relative: 1 % (ref 0–1)
Eosinophils Absolute: 0.2 10*3/uL (ref 0.0–0.7)
Eosinophils Relative: 2 % (ref 0–5)
HEMATOCRIT: 42.8 % (ref 36.0–46.0)
HEMOGLOBIN: 14.3 g/dL (ref 12.0–15.0)
LYMPHS ABS: 3 10*3/uL (ref 0.7–4.0)
Lymphocytes Relative: 40 % (ref 12–46)
MCH: 31.4 pg (ref 26.0–34.0)
MCHC: 33.4 g/dL (ref 30.0–36.0)
MCV: 94.1 fL (ref 78.0–100.0)
MPV: 9.7 fL (ref 8.6–12.4)
Monocytes Absolute: 0.8 10*3/uL (ref 0.1–1.0)
Monocytes Relative: 11 % (ref 3–12)
NEUTROS ABS: 3.5 10*3/uL (ref 1.7–7.7)
NEUTROS PCT: 46 % (ref 43–77)
PLATELETS: 211 10*3/uL (ref 150–400)
RBC: 4.55 MIL/uL (ref 3.87–5.11)
RDW: 13.3 % (ref 11.5–15.5)
WBC: 7.6 10*3/uL (ref 4.0–10.5)

## 2014-07-02 LAB — MAGNESIUM: Magnesium: 1.8 mg/dL (ref 1.5–2.5)

## 2014-07-02 NOTE — Patient Instructions (Signed)
Edema  Edema is an abnormal buildup of fluids. It is more common in your legs and thighs. Painless swelling of the feet and ankles is more likely as a person ages. It also is common in looser skin, like around your eyes.  HOME CARE   · Keep the affected body part above the level of the heart while lying down.  · Do not sit still or stand for a long time.  · Do not put anything right under your knees when you lie down.  · Do not wear tight clothes on your upper legs.  · Exercise your legs to help the puffiness (swelling) go down.  · Wear elastic bandages or support stockings as told by your doctor.  · A low-salt diet may help lessen the puffiness.  · Only take medicine as told by your doctor.  GET HELP IF:  · Treatment is not working.  · You have heart, liver, or kidney disease and notice that your skin looks puffy or shiny.  · You have puffiness in your legs that does not get better when you raise your legs.  · You have sudden weight gain for no reason.  GET HELP RIGHT AWAY IF:   · You have shortness of breath or chest pain.  · You cannot breathe when you lie down.  · You have pain, redness, or warmth in the areas that are puffy.  · You have heart, liver, or kidney disease and get edema all of a sudden.  · You have a fever and your symptoms get worse all of a sudden.  MAKE SURE YOU:   · Understand these instructions.  · Will watch your condition.  · Will get help right away if you are not doing well or get worse.  Document Released: 11/01/2007 Document Revised: 05/20/2013 Document Reviewed: 03/07/2013  [employer]® Patient Information ©2015 [employer]. This information is not intended to replace advice given to you by your health care provider. Make sure you discuss any questions you have with your health care provider.

## 2014-07-02 NOTE — Progress Notes (Signed)
Subjective:    Patient ID: [name], female    DOB: [birth year], 81 y.o.   MRN: [id number]  HPI This very nice 81 yo MWF with multiple co-morbidies including , HTN, ASHD, ASCVD s/p embolic CVA w/ residual dysphasia/dysnomia who had recent exascerbation of dependent edema & had HCTZ switched by Dr Delton SeeNelson to Furosemide and apparently is improving with lessening of her dependent edema. Pt's husb questions te need to continue her meds and he is assured to continue same. Patient is likewise advised to continue her coumadin .   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 Lastly, pr's GERD & IBS are controlled on her current regimen.   Medication Sig  . acetaminophen 325 MG tablet Take 650 mg  every 4 hours as needed  . VITAMIN D 2000 U Take 1 cap daily.   . furosemide  20 MG  Take 1 tab (20 mg total) by mouth daily.  . [id number] MG Take 1-2 tab every 4  hours as needed  . metoprolol (LOPRESSOR) 50 MG tablet TAKE ONE TAB TWICE DAILY  . pantoprazole (PROTONIX) 40 MG tablet TAKE ONE TAB ONCE DAILY FORINDIGESTION.  .  KCl / K-DUR 10 MEQ tab Take 1 tab daily.  . ranitidine 300 MG tab Take 1 tablet (300 mg total) by mouth 2 (two) times daily.  . rosuvastatin  20 MG tab Take 1 tablet (20 mg total) by mouth every evening.  . warfarin 2 MG tablet TAKE AS DIRECTED BY ANTICOAGULATION CLINIC   Allergies  Allergen Reactions  . Ace Inhibitors Cough  . Fosamax [Alendronate Sodium] Heart burn  . Norvasc [Amlodipine Besylate] edema  . Zocor [Simvastatin] Elevated CPK   Past Medical History  Diagnosis Date  . Atrial fibrillation   . Aphasia as late effect of cerebrovascular accident   . CVA (cerebral infarction)   . Hyperlipidemia   . HTN (hypertension)   . Anxiety   . Diabetes mellitus type II   . Hiatal hernia   . GERD (gastroesophageal reflux disease)   . IBS (irritable bowel syndrome)   . Vitamin D deficiency    . Stroke    Past Surgical History  Procedure Laterality Date  . Upper gastrointestinal endoscopy  2005  . Colonoscopy  2005  . Cholecystectomy    . Cataract extraction, bilateral    . Tonsillectomy     Review of Systems 10 pt systems review negative except as above.    Objective:   Physical Exam BP 116/78 mmHg  Pulse 72  Temp(Src) 98.1 F (36.7 C)  Resp 16  Ht 5\' 2"  (1.575 m)  Wt 126 lb 6.4 oz (57.335 kg)  BMI 23.11 kg/m2  HEENT - Eac's patent. TM's Nl. EOM's full. PERRLA. NasoOroPharynx clear. Neck - supple. Nl Thyroid. Carotids 2+ & No bruits, nodes, JVD Chest - Clear equal BS w/o Rales, rhonchi, wheezes. Cor - Nl HS. RRR w/o sig MGR. PP 1(+). No edema. Abd - No palpable organomegaly, masses or tenderness. BS nl. MS- FROM w/o deformities. Muscle power, tone and bulk Nl. Gait Nl. Neuro - No obvious Cr N abnormalities. Sensory, motor and Cerebellar functions appear Nl w/o focal abnormalities. (+) dysnomia. Psyche - Mental status normal & appropriate.  No delusions, ideations or obvious mood abnormalities.    Assessment & Plan:   1. Essential hypertension   2. Edema   3. Prediabetes  - Hemoglobin A1c  4. Encounter  for therapeutic drug monitoring  - CBC with Differential/Platelet - BASIC METABOLIC PANEL WITH GFR - Magnesium

## 2014-07-03 LAB — HEMOGLOBIN A1C
HEMOGLOBIN A1C: 6.6 % — AB (ref <5.7)
MEAN PLASMA GLUCOSE: 143 mg/dL — AB (ref <117)

## 2014-07-06 ENCOUNTER — Other Ambulatory Visit: Payer: Self-pay | Admitting: Cardiology

## 2014-07-10 ENCOUNTER — Ambulatory Visit (INDEPENDENT_AMBULATORY_CARE_PROVIDER_SITE_OTHER): Payer: Medicare Other | Admitting: *Deleted

## 2014-07-10 DIAGNOSIS — I481 Persistent atrial fibrillation: Secondary | ICD-10-CM

## 2014-07-10 DIAGNOSIS — I4891 Unspecified atrial fibrillation: Secondary | ICD-10-CM

## 2014-07-10 DIAGNOSIS — I4819 Other persistent atrial fibrillation: Secondary | ICD-10-CM

## 2014-07-10 DIAGNOSIS — I6350 Cerebral infarction due to unspecified occlusion or stenosis of unspecified cerebral artery: Secondary | ICD-10-CM

## 2014-07-10 DIAGNOSIS — Z5181 Encounter for therapeutic drug level monitoring: Secondary | ICD-10-CM

## 2014-07-10 DIAGNOSIS — I639 Cerebral infarction, unspecified: Secondary | ICD-10-CM

## 2014-07-10 LAB — POCT INR: INR: 2.3

## 2014-07-24 ENCOUNTER — Encounter: Payer: Self-pay | Admitting: Cardiology

## 2014-07-24 ENCOUNTER — Ambulatory Visit (INDEPENDENT_AMBULATORY_CARE_PROVIDER_SITE_OTHER): Payer: Medicare Other | Admitting: Pharmacist

## 2014-07-24 ENCOUNTER — Ambulatory Visit (INDEPENDENT_AMBULATORY_CARE_PROVIDER_SITE_OTHER): Payer: Medicare Other | Admitting: Cardiology

## 2014-07-24 VITALS — BP 124/72 | HR 66 | Ht 62.0 in | Wt 127.0 lb

## 2014-07-24 DIAGNOSIS — I4819 Other persistent atrial fibrillation: Secondary | ICD-10-CM

## 2014-07-24 DIAGNOSIS — I5033 Acute on chronic diastolic (congestive) heart failure: Secondary | ICD-10-CM

## 2014-07-24 DIAGNOSIS — Z5181 Encounter for therapeutic drug level monitoring: Secondary | ICD-10-CM

## 2014-07-24 DIAGNOSIS — I6350 Cerebral infarction due to unspecified occlusion or stenosis of unspecified cerebral artery: Secondary | ICD-10-CM

## 2014-07-24 DIAGNOSIS — I482 Chronic atrial fibrillation, unspecified: Secondary | ICD-10-CM

## 2014-07-24 DIAGNOSIS — I481 Persistent atrial fibrillation: Secondary | ICD-10-CM

## 2014-07-24 DIAGNOSIS — I4891 Unspecified atrial fibrillation: Secondary | ICD-10-CM

## 2014-07-24 DIAGNOSIS — I639 Cerebral infarction, unspecified: Secondary | ICD-10-CM

## 2014-07-24 DIAGNOSIS — I10 Essential (primary) hypertension: Secondary | ICD-10-CM

## 2014-07-24 LAB — BASIC METABOLIC PANEL
BUN: 14 mg/dL (ref 6–23)
CO2: 33 mEq/L — ABNORMAL HIGH (ref 19–32)
Calcium: 10.1 mg/dL (ref 8.4–10.5)
Chloride: 102 mEq/L (ref 96–112)
Creatinine, Ser: 0.87 mg/dL (ref 0.40–1.20)
GFR: 65.37 mL/min (ref >60.00)
Glucose, Bld: 104 mg/dL — ABNORMAL HIGH (ref 70–99)
Potassium: 3.5 mEq/L (ref 3.5–5.1)
Sodium: 140 mEq/L (ref 135–145)

## 2014-07-24 LAB — POCT INR: INR: 3.2

## 2014-07-24 MED ORDER — FUROSEMIDE 20 MG PO TABS: ORAL_TABLET | ORAL | Status: DC

## 2014-07-24 NOTE — Progress Notes (Signed)
Patient ID: Lisa Hopkins, female   DOB: 1924/09/22, 81 y.o.   MRN: [id number]    Patient Name: Lisa Hopkins Date of Encounter: 07/24/2014  Primary Care Provider:  Nadean Corwin, MD Primary Cardiologist:  Lars Masson   Problem List   Past Medical History  Diagnosis Date  . Atrial fibrillation   . Aphasia as late effect of cerebrovascular accident   . CVA (cerebral infarction)   . Hyperlipidemia   . HTN (hypertension)   . Anxiety   . Diabetes mellitus type II   . Hiatal hernia   . GERD (gastroesophageal reflux disease)   . IBS (irritable bowel syndrome)   . Vitamin D deficiency   . Stroke    Past Surgical History  Procedure Laterality Date  . Upper gastrointestinal endoscopy  2005  . Colonoscopy  2005  . Cholecystectomy    . Cataract extraction, bilateral    . Tonsillectomy      Allergies  Allergies  Allergen Reactions  . Ace Inhibitors     Cough  . Fosamax [Alendronate Sodium]     Heart burn  . Norvasc [Amlodipine Besylate]     edema  . Zocor [Simvastatin]     Elevated CPK    HPI  [name] is a pleasant 81 year old female with h/o chronic A. Fib and anticoagulation post CVA in 2010. She has been stable since then, no recurrent stroke, no DOE, CP, LE edema or DOE or palpitations.  She very compliant with her medications. She is complaining of lower abdominal pain that progresses to epigastric pain and retrosternal burning. She used to experience it daily after dinner, but recently it has become less frequent. She denies change in apettite, diarhea, bloody or black tarry stools.  She has h/o GERD and acute ischemic colitis 2 years ago, it seems that she didn't follow with a gastroenterologist since then.  06/24/2014 - the patient had an episode of colitis 1 week ago, for which she received metronidazole and ciprofloxacine. She has noticed LE edema since she started it. IT was discontinued. She has noticed worsening SOB, no chest pain,  no palpitations. Two falls in the last year, both mechanical. Complaint with her meds.   07/24/2014 - the patient is coming after 3 weeks, LE edema has improved, she is experiencing difficulty swallowing large KCl pills and having diarrhea. Otherwise denies DOE, orthopnea, or PND.   Home Medications  Prior to Admission medications   Medication Sig Start Date End Date Taking? Authorizing Provider  acetaminophen (TYLENOL) 325 MG tablet Take 650 mg by mouth every 4 (four) hours as needed.     Yes Historical Provider, MD  alum hydroxide-mag trisilicate (ANTACID) 80-20 MG CHEW Chew 1 tablet by mouth 3 (three) times daily as needed.   Yes Historical Provider, MD  Cholecalciferol (VITAMIN D) 2000 UNITS CAPS Take 2 capsules by mouth daily.    Yes Historical Provider, MD  hydrochlorothiazide (HYDRODIURIL) 25 MG tablet TAKE ONE TABLET BY MOUTH IN THE MORNING FOR BLOOD PRESSURE AND FLUID 05/30/13  Yes Lucky Cowboy, MD  HYDROcodone-acetaminophen (NORCO/VICODIN) 5-325 MG per tablet Take 1-2 tablets by mouth every 4 (four) hours as needed. 03/17/12  Yes Dorothea Ogle, MD  metoprolol (LOPRESSOR) 50 MG tablet Take 50 mg by mouth 2 (two) times daily.     Yes Historical Provider, MD  pantoprazole (PROTONIX) 40 MG tablet Take 1 tablet (40 mg total) by mouth daily. 03/17/12  Yes Dorothea Ogle, MD  polyethylene glycol (  MIRALAX / GLYCOLAX) packet Take 17 g by mouth daily. 03/17/12  Yes Dorothea Ogle, MD  rosuvastatin (CRESTOR) 20 MG tablet Take 1 tablet (20 mg total) by mouth every evening. 05/26/13  Yes Quentin Mulling, PA-C  warfarin (COUMADIN) 2 MG tablet Take as directed by anticoagulation clinic 12/24/12  Yes Gaylord Shih, MD    Family History  Family History  Problem Relation Age of Onset  . Diabetes Mother   . Stroke Mother   . Hypertension Brother   . Heart disease Brother   . Arthritis Sister     Rhematoid  . Arthritis Sister     Rhematoid    Social History  History   Social History  .  Marital Status: Married    Spouse Name: N/A  . Number of Children: N/A  . Years of Education: N/A   Occupational History  . Not on file.   Social History Main Topics  . Smoking status: Never Smoker   . Smokeless tobacco: Never Used  . Alcohol Use: No  . Drug Use: No  . Sexual Activity: No   Other Topics Concern  . Not on file   Social History Narrative     Review of Systems, as per HPI, otherwise negative General:  No chills, fever, night sweats or weight changes.  Cardiovascular:  No chest pain, dyspnea on exertion, edema, orthopnea, palpitations, paroxysmal nocturnal dyspnea. Dermatological: No rash, lesions/masses Respiratory: No cough, dyspnea Urologic: No hematuria, dysuria Abdominal:   No nausea, vomiting, diarrhea, bright red blood per rectum, melena, or hematemesis Neurologic:  No visual changes, wkns, changes in mental status. All other systems reviewed and are otherwise negative except as noted above.  Physical Exam  Blood pressure 124/72, pulse 66, height 5\' 2"  (1.575 m), weight 127 lb (57.607 kg), SpO2 99 %.  General: Pleasant, NAD Psych: Normal affect. Neuro: Alert and oriented X 3. Moves all extremities spontaneously. HEENT: Normal  Neck: Supple without bruits or JVD. Lungs:  Resp regular and unlabored, CTA. Heart: RRR no s3, s4, or murmurs. Abdomen: Soft, non-tender, non-distended, BS + x 4.  Extremities: No clubbing, cyanosis , trivial peri-malleolar edema, . DP/PT/Radials 2+ and equal bilaterally.  Labs:  No results for input(s): CKTOTAL, CKMB, TROPONINI in the last 72 hours. Lab Results  Component Value Date   WBC 7.6 07/02/2014   HGB 14.3 07/02/2014   HCT 42.8 07/02/2014   MCV 94.1 07/02/2014   PLT 211 07/02/2014    No results for input(s): NA, K, CL, CO2, BUN, CREATININE, CALCIUM, PROT, BILITOT, ALKPHOS, ALT, AST, GLUCOSE in the last 168 hours.  Invalid input(s): LABALBU Lab Results  Component Value Date   CHOL 129 06/18/2014   HDL 47  06/18/2014   LDLCALC 52 06/18/2014   TRIG 151* 06/18/2014   No results found for: DDIMER Invalid input(s): POCBNP  Accessory Clinical Findings  echocardiogram  ECG - Atrial fibrillation, non-specific ST-T wave changes  Procedures/Studies:  Ct Abdomen Pelvis W Contrast  03/12/2012  IMPRESSION:  1. Marked wall thickening and mucosal enhancement in the descending and sigmoid colon, compatible with severe colitis. Surrounding stranding and mild ascites. The celiac trunk and mesenteric arteries appear patent, and no intramural gas is observed in the colon. This colitis is probably infectious rather than ischemic, and if the patient has been on recent antibiotics then checking for C difficile toxins may be warranted. I doubt that the appearance is due to bowel wall hematoma.  2. Large hiatal hernia, chronic.  Echocardiogram 02/22/2013  - Left ventricle: The cavity size was normal. Systolic function was normal. The estimated ejection fraction was in the range of 55% to 60%. Wall motion was normal; there were no regional wall motion abnormalities. Doppler parameters are consistent with abnormal left ventricular relaxation (grade 1 diastolic dysfunction). Doppler parameters are consistent with elevated mean left atrial filling pressure. - Aortic valve: Mild regurgitation. - Mitral valve: Moderately calcified annulus. Mild regurgitation. - Right ventricle: Systolic pressure was increased. - Right atrium: The atrium was mildly dilated. - Tricuspid valve: Mild regurgitation. - Pulmonary arteries: PA peak pressure: 46mm Hg (S). Impressions:  - The right ventricular systolic pressure was increased consistent with moderate pulmonary hypertension.    Assessment & Plan  1. Acute diastolic CHF - D/C HCTZ and started Lasix 20 mg po daily with KCL 10 mEq daily K 3.5 on lab on 06/18/2014). Almost resolved LE edema, we will decrease Lasix to 20 mg po every other day and d/c KCl/ start diet  high in sodium.   2. Chronic a-fib, h/o CVA on chronic anticoagulation with coumadine - rate controlled, complaint to her meds, no more strokes  3. Hypertension - well controlled  4. Hyperlipidemia - at goal on labs from 06/18/2014.  5. Pulmonary hypertension - the patient is asymptomatic and well compensated  6. Abdominal pain - episodes of colitis, s/p ATB treatment 1 week ago.   BMP today, follow up in 3 months.   Lars Masson, MD, [facility] 07/24/2014, 12:18 PM

## 2014-07-24 NOTE — Patient Instructions (Signed)
**Note De-Identified  Obfuscation** Your physician has recommended you make the following change in your medication: Stop taking Potassium and decrease Lasix to 20 mg every other day (take on Tues, Thurs and Sat)  Your physician recommends that you return for lab work in: today Designer, jewellery(BMET)  Your physician recommends that you schedule a follow-up appointment in: 3 months

## 2014-08-04 ENCOUNTER — Encounter: Payer: Self-pay | Admitting: Internal Medicine

## 2014-08-04 ENCOUNTER — Ambulatory Visit (INDEPENDENT_AMBULATORY_CARE_PROVIDER_SITE_OTHER): Payer: Medicare Other | Admitting: Internal Medicine

## 2014-08-04 VITALS — BP 126/82 | HR 64 | Temp 97.8°F | Resp 16 | Ht 62.0 in | Wt 124.0 lb

## 2014-08-04 DIAGNOSIS — N390 Urinary tract infection, site not specified: Secondary | ICD-10-CM

## 2014-08-04 DIAGNOSIS — I5032 Chronic diastolic (congestive) heart failure: Secondary | ICD-10-CM

## 2014-08-04 DIAGNOSIS — I5030 Unspecified diastolic (congestive) heart failure: Secondary | ICD-10-CM | POA: Insufficient documentation

## 2014-08-04 DIAGNOSIS — N952 Postmenopausal atrophic vaginitis: Secondary | ICD-10-CM

## 2014-08-04 MED ORDER — ESTROGENS, CONJUGATED 0.625 MG/GM VA CREA: 1.0000 | TOPICAL_CREAM | Freq: Every day | VAGINAL | Status: DC

## 2014-08-04 NOTE — Progress Notes (Signed)
Patient ID: Lisa Hopkins, female   DOB: 10/20/1924, 81 y.o.   MRN: [id number]  Assessment and Plan:   1. Chronic diastolic congestive heart failure -restart lasix 20 mg every other day -patient to have f/u with Dr. Delton See in 3 weeks to recheck BMP   2. Urinary tract infection without hematuria, site unspecified -Dysuria reported. Will r/o UTI, suspect it may be due to atrophic vaginitis - Urinalysis, Routine w reflex microscopic  3. Atrophic vaginitis -start premarin to determine whether this is causing itching and dysuria.  Will d/c triamcinolone cream - conjugated estrogens (PREMARIN) vaginal cream; Place 1 Applicatorful vaginally daily. Use daily for 2 weeks and then use 3 days a week  Dispense: 42.5 g; Refill: 12     HPI 81 y.o.female presents for 1 month follow up of leg edema.  She has been seeing Dr. Delton See from Cardiology who has started her on lasix and wants her to take it every other day.  The Patient reports that they have been doing well.  female is not taking their medication. They thought they were supposed to d/c lasix per Dr. Delton See.  Dr. Lindaann Slough note indicated that they were supposed to take every other day.  They are not having difficulty with their medications.  They report no adverse reactions.    Also complains of a rash around the vagina.  The rash is itchy, red, and sometimes painful.  She has been using triamcinolone cream.  It has not helped.  She has had some pain with urinating.  She also reports foul smelling urine.  Has had some mild abdominal pain.  It is suprapubic pain.  Also loose stools 3x per day.  This diarrhea has been going on for some time.  She has not tried any medications for it.      Past Medical History  Diagnosis Date  . Atrial fibrillation   . Aphasia as late effect of cerebrovascular accident   . CVA (cerebral infarction)   . Hyperlipidemia   . HTN (hypertension)   . Anxiety   . Diabetes mellitus type II   . Hiatal hernia   .  GERD (gastroesophageal reflux disease)   . IBS (irritable bowel syndrome)   . Vitamin D deficiency   . Stroke      Allergies  Allergen Reactions  . Ace Inhibitors     Cough  . Fosamax [Alendronate Sodium]     Heart burn  . Norvasc [Amlodipine Besylate]     edema  . Zocor [Simvastatin]     Elevated CPK      Current Outpatient Prescriptions on File Prior to Visit  Medication Sig Dispense Refill  . acetaminophen (TYLENOL) 325 MG tablet Take 650 mg by mouth every 4 (four) hours as needed (PAIN).     . Cholecalciferol (VITAMIN D) 2000 UNITS CAPS Take 1 capsule by mouth daily.     . Magnesium Hydroxide (MAGNESIA PO) Take 1 tablet by mouth.    . metoprolol (LOPRESSOR) 50 MG tablet TAKE ONE TABLET BY MOUTH TWICE DAILY 60 tablet 99  . pantoprazole (PROTONIX) 40 MG tablet TAKE ONE TABLET BY MOUTH ONCE DAILY FOR ACID INDIGESTION. 30 tablet 3  . ranitidine (ZANTAC) 300 MG tablet Take 1 tablet (300 mg total) by mouth 2 (two) times daily. 60 tablet 3  . rosuvastatin (CRESTOR) 20 MG tablet Take 1 tablet (20 mg total) by mouth every evening. 90 tablet 3  . warfarin (COUMADIN) 2 MG tablet TAKE AS DIRECTED BY ANTICOAGULATION CLINIC  35 tablet 3   No current facility-administered medications on file prior to visit.    ROS: all negative except above.   Physical Exam: Filed Weights   08/04/14 1442  Weight: 124 lb (56.246 kg)   BP 126/82 mmHg  Pulse 64  Temp(Src) 97.8 F (36.6 C) (Temporal)  Resp 16  Ht 5\' 2"  (1.575 m)  Wt 124 lb (56.246 kg)  BMI 22.67 kg/m2 General Appearance: Well developed well nourished, non-toxic appearing in no apparent distress. Eyes: PERRLA, EOMs, conjunctiva w/ no swelling or erythema or discharge Sinuses: No Frontal/maxillary tenderness ENT/Mouth: Ear canals clear without swelling or erythema.  TM's normal bilaterally with no retractions, bulging, or loss of landmarks.   Neck: Supple, thyroid normal, no notable JVD  Respiratory: Respiratory effort normal,  Clear breath sounds anteriorly and posteriorly bilaterally without rales, rhonchi, wheezing or stridor. No retractions or accessory muscle usage. Cardio:Ireggular ireg with no MRGs noted, 2+ edema of the bilateral ankles Abdomen: Soft, + BS.  Minimal suprapubic tenderness, no guarding, rebound, hernias, masses.  Musculoskeletal: Full ROM, 5/5 strength, normal gait.  Skin: Warm, dry without rashes  Neuro: Awake and oriented X 3, Cranial nerves intact. Normal muscle tone, no cerebellar symptoms. Sensation intact.  Psych: normal affect, Insight and Judgment appropriate.     FORCUCCI, Tona Qualley, PA-C 2:47 PM [facility]

## 2014-08-04 NOTE — Patient Instructions (Signed)
RESTART LASIX 20 mg Every other day.  Eat salt on a day when you are taking the medication.  Use new medication for rash.  Call if you have any problems.    Atrophic Vaginitis Atrophic vaginitis is a problem of low levels of estrogen in women. This problem can happen at any age. It is most common in women who have gone through menopause ("the change").  HOW WILL I KNOW IF I HAVE THIS PROBLEM? You may have:  Trouble with peeing (urinating), such as:  Going to the bathroom often.  A hard time holding your pee until you reach a bathroom.  Leaking pee.  Having pain when you pee.  Itching or a burning feeling.  Vaginal bleeding and spotting.  Pain during sex.  Dryness of the vagina.  A yellow, bad-smelling fluid (discharge) coming from the vagina. HOW WILL MY DOCTOR CHECK FOR THIS PROBLEM?  During your exam, your doctor will likely find the problem.  If there is a vaginal fluid, it may be checked for infection. HOW WILL THIS PROBLEM BE TREATED? Keep the vulvar skin as clean as possible. Moisturizers and lubricants can help with some of the symptoms. Estrogen replacement can help. There are 2 ways to take estrogen:  Systemic estrogen gets estrogen to your whole body. It takes many weeks or months before the symptoms get better.  You take an estrogen pill.  You use a skin patch. This is a patch that you put on your skin.  If you still have your uterus, your doctor may ask you to take a hormone. Talk to your doctor about the right medicine for you.  Estrogen cream.  This puts estrogen only at the part of your body where you apply it. The cream is put into the vagina or put on the vulvar skin. For some women, estrogen cream works faster than pills or the patch. CAN ALL WOMEN WITH THIS PROBLEM USE ESTROGEN? No. Women with certain types of cancer, liver problems, or problems with blood clots should not take estrogen. Your doctor can help you decide the best treatment for your  symptoms. Document Released: 11/01/2007 Document Revised: 05/20/2013 Document Reviewed: 11/01/2007 [facility] Patient Information 2015 [employer], [place]. This information is not intended to replace advice given to you by your health care provider. Make sure you discuss any questions you have with your health care provider.

## 2014-08-05 LAB — URINALYSIS, ROUTINE W REFLEX MICROSCOPIC
Bilirubin Urine: NEGATIVE
Glucose, UA: NEGATIVE mg/dL
Ketones, ur: NEGATIVE mg/dL
LEUKOCYTES UA: NEGATIVE
Nitrite: NEGATIVE
Protein, ur: NEGATIVE mg/dL
Specific Gravity, Urine: 1.023 (ref 1.005–1.030)
Urobilinogen, UA: 0.2 mg/dL (ref 0.0–1.0)
pH: 5 (ref 5.0–8.0)

## 2014-08-05 LAB — URINALYSIS, MICROSCOPIC ONLY
Bacteria, UA: NONE SEEN
Casts: NONE SEEN

## 2014-08-14 ENCOUNTER — Telehealth: Payer: Self-pay | Admitting: *Deleted

## 2014-08-14 NOTE — Telephone Encounter (Signed)
Patient called with c/o increasing vaginal redness and rawness.  Very uncomfortable.  Per Terri Piedraourtney Forcucci, PA-C, advised patient to d/c Premarin and use A&D Ointment, steroid cream she has at home and will schedule GYN referral for patient for additional eval and Tx.

## 2014-08-17 ENCOUNTER — Other Ambulatory Visit: Payer: Self-pay | Admitting: *Deleted

## 2014-08-17 DIAGNOSIS — N952 Postmenopausal atrophic vaginitis: Secondary | ICD-10-CM

## 2014-08-17 DIAGNOSIS — N949 Unspecified condition associated with female genital organs and menstrual cycle: Secondary | ICD-10-CM

## 2014-08-21 ENCOUNTER — Ambulatory Visit (INDEPENDENT_AMBULATORY_CARE_PROVIDER_SITE_OTHER): Payer: Medicare Other | Admitting: Pharmacist

## 2014-08-21 DIAGNOSIS — Z5181 Encounter for therapeutic drug level monitoring: Secondary | ICD-10-CM | POA: Diagnosis not present

## 2014-08-21 DIAGNOSIS — I481 Persistent atrial fibrillation: Secondary | ICD-10-CM

## 2014-08-21 DIAGNOSIS — I6350 Cerebral infarction due to unspecified occlusion or stenosis of unspecified cerebral artery: Secondary | ICD-10-CM

## 2014-08-21 DIAGNOSIS — I4891 Unspecified atrial fibrillation: Secondary | ICD-10-CM

## 2014-08-21 DIAGNOSIS — I4819 Other persistent atrial fibrillation: Secondary | ICD-10-CM

## 2014-08-21 DIAGNOSIS — I639 Cerebral infarction, unspecified: Secondary | ICD-10-CM

## 2014-08-21 LAB — POCT INR: INR: 2.8

## 2014-08-25 ENCOUNTER — Telehealth: Payer: Self-pay

## 2014-08-25 MED ORDER — HYDROCHLOROTHIAZIDE 25 MG PO TABS: 25.0000 mg | ORAL_TABLET | Freq: Every day | ORAL | Status: DC

## 2014-08-25 NOTE — Telephone Encounter (Signed)
Patient's spouse called stating that patient has swelling in both feet. Per Dr.McKeown, he can call in HCTZ 25mg  1 tablet QD #90 x1rf. Patient's spouse aware. Rx sent to [facility].

## 2014-09-18 ENCOUNTER — Ambulatory Visit (INDEPENDENT_AMBULATORY_CARE_PROVIDER_SITE_OTHER): Payer: Medicare Other | Admitting: *Deleted

## 2014-09-18 DIAGNOSIS — I4891 Unspecified atrial fibrillation: Secondary | ICD-10-CM | POA: Diagnosis not present

## 2014-09-18 DIAGNOSIS — I639 Cerebral infarction, unspecified: Secondary | ICD-10-CM | POA: Diagnosis not present

## 2014-09-18 DIAGNOSIS — Z5181 Encounter for therapeutic drug level monitoring: Secondary | ICD-10-CM

## 2014-09-18 DIAGNOSIS — I4819 Other persistent atrial fibrillation: Secondary | ICD-10-CM

## 2014-09-18 DIAGNOSIS — I481 Persistent atrial fibrillation: Secondary | ICD-10-CM | POA: Diagnosis not present

## 2014-09-18 DIAGNOSIS — I6350 Cerebral infarction due to unspecified occlusion or stenosis of unspecified cerebral artery: Secondary | ICD-10-CM

## 2014-09-18 LAB — POCT INR: INR: 2.9

## 2014-09-24 ENCOUNTER — Ambulatory Visit: Payer: Self-pay | Admitting: Internal Medicine

## 2014-09-25 ENCOUNTER — Ambulatory Visit: Payer: Medicare Other | Admitting: Internal Medicine

## 2014-09-25 ENCOUNTER — Other Ambulatory Visit: Payer: Self-pay | Admitting: Internal Medicine

## 2014-09-25 ENCOUNTER — Encounter: Payer: Self-pay | Admitting: Internal Medicine

## 2014-09-25 DIAGNOSIS — K219 Gastro-esophageal reflux disease without esophagitis: Secondary | ICD-10-CM | POA: Insufficient documentation

## 2014-09-25 NOTE — Progress Notes (Signed)
Patient ID: Lisa GalloFrances H Dulak, female   DOB: [birth year], 81 y.o.   MRN: [id number]   Stephenie Acres  O      S  H  O  W

## 2014-10-01 ENCOUNTER — Ambulatory Visit: Payer: Medicare Other | Admitting: Internal Medicine

## 2014-10-01 ENCOUNTER — Encounter: Payer: Self-pay | Admitting: Internal Medicine

## 2014-10-01 VITALS — BP 120/72 | HR 60 | Temp 97.0°F | Resp 16 | Ht 62.0 in | Wt 120.2 lb

## 2014-10-01 DIAGNOSIS — K219 Gastro-esophageal reflux disease without esophagitis: Secondary | ICD-10-CM

## 2014-10-01 DIAGNOSIS — Z1331 Encounter for screening for depression: Secondary | ICD-10-CM

## 2014-10-01 DIAGNOSIS — Z79899 Other long term (current) drug therapy: Secondary | ICD-10-CM

## 2014-10-01 DIAGNOSIS — Z9181 History of falling: Secondary | ICD-10-CM

## 2014-10-01 DIAGNOSIS — I639 Cerebral infarction, unspecified: Secondary | ICD-10-CM

## 2014-10-01 DIAGNOSIS — E559 Vitamin D deficiency, unspecified: Secondary | ICD-10-CM

## 2014-10-01 DIAGNOSIS — I10 Essential (primary) hypertension: Secondary | ICD-10-CM

## 2014-10-01 DIAGNOSIS — Z0000 Encounter for general adult medical examination without abnormal findings: Secondary | ICD-10-CM

## 2014-10-01 DIAGNOSIS — I4819 Other persistent atrial fibrillation: Secondary | ICD-10-CM

## 2014-10-01 DIAGNOSIS — E785 Hyperlipidemia, unspecified: Secondary | ICD-10-CM

## 2014-10-01 DIAGNOSIS — E1122 Type 2 diabetes mellitus with diabetic chronic kidney disease: Secondary | ICD-10-CM

## 2014-10-01 DIAGNOSIS — R7303 Prediabetes: Secondary | ICD-10-CM

## 2014-10-01 LAB — BASIC METABOLIC PANEL WITH GFR
BUN: 18 mg/dL (ref 6–23)
CO2: 29 mEq/L (ref 19–32)
Calcium: 10 mg/dL (ref 8.4–10.5)
Chloride: 100 mEq/L (ref 96–112)
Creat: 0.94 mg/dL (ref 0.50–1.10)
GFR, EST AFRICAN AMERICAN: 63 mL/min
GFR, Est Non African American: 55 mL/min — ABNORMAL LOW
GLUCOSE: 123 mg/dL — AB (ref 70–99)
Potassium: 3.8 mEq/L (ref 3.5–5.3)
Sodium: 141 mEq/L (ref 135–145)

## 2014-10-01 LAB — CBC WITH DIFFERENTIAL/PLATELET
BASOS ABS: 0 10*3/uL (ref 0.0–0.1)
BASOS PCT: 0 % (ref 0–1)
EOS ABS: 0.1 10*3/uL (ref 0.0–0.7)
Eosinophils Relative: 2 % (ref 0–5)
HEMATOCRIT: 44.9 % (ref 36.0–46.0)
Hemoglobin: 15.2 g/dL — ABNORMAL HIGH (ref 12.0–15.0)
Lymphocytes Relative: 42 % (ref 12–46)
Lymphs Abs: 2.8 10*3/uL (ref 0.7–4.0)
MCH: 31.1 pg (ref 26.0–34.0)
MCHC: 33.9 g/dL (ref 30.0–36.0)
MCV: 92 fL (ref 78.0–100.0)
MONO ABS: 0.6 10*3/uL (ref 0.1–1.0)
MPV: 10.4 fL (ref 8.6–12.4)
Monocytes Relative: 9 % (ref 3–12)
Neutro Abs: 3.1 10*3/uL (ref 1.7–7.7)
Neutrophils Relative %: 47 % (ref 43–77)
PLATELETS: 206 10*3/uL (ref 150–400)
RBC: 4.88 MIL/uL (ref 3.87–5.11)
RDW: 13.3 % (ref 11.5–15.5)
WBC: 6.7 10*3/uL (ref 4.0–10.5)

## 2014-10-01 LAB — HEPATIC FUNCTION PANEL
ALK PHOS: 83 U/L (ref 39–117)
ALT: 16 U/L (ref 0–35)
AST: 24 U/L (ref 0–37)
Albumin: 4.1 g/dL (ref 3.5–5.2)
BILIRUBIN TOTAL: 0.9 mg/dL (ref 0.2–1.2)
Bilirubin, Direct: 0.2 mg/dL (ref 0.0–0.3)
Indirect Bilirubin: 0.7 mg/dL (ref 0.2–1.2)
Total Protein: 6.9 g/dL (ref 6.0–8.3)

## 2014-10-01 LAB — HEMOGLOBIN A1C
HEMOGLOBIN A1C: 6.6 % — AB (ref <5.7)
Mean Plasma Glucose: 143 mg/dL — ABNORMAL HIGH (ref <117)

## 2014-10-01 LAB — LIPID PANEL
CHOLESTEROL: 108 mg/dL (ref 0–200)
HDL: 44 mg/dL — AB (ref >=46)
LDL Cholesterol: 40 mg/dL (ref 0–99)
Total CHOL/HDL Ratio: 2.5 Ratio
Triglycerides: 118 mg/dL (ref <150)
VLDL: 24 mg/dL (ref 0–40)

## 2014-10-01 LAB — MAGNESIUM: Magnesium: 1.7 mg/dL (ref 1.5–2.5)

## 2014-10-01 LAB — TSH: TSH: 0.554 u[IU]/mL (ref 0.350–4.500)

## 2014-10-01 NOTE — Progress Notes (Signed)
Patient ID: Lisa GalloFrances H Hopkins, female   DOB: 11/22/1924, 81 y.o.   MRN: [id number]  MEDICARE ANNUAL WELLNESS VISIT AND CPE  Assessment:    1. Essential hypertension -dash diet -cont to monitor at home -cont to take medications -stand slowly when going from sitting to standing - TSH  2. Hyperlipidemia -cont meds -diet and exercise - Lipid panel  3. Prediabetes -cont diet and exercise - Hemoglobin A1c - Insulin, random  4. Vitamin D deficiency -cont supplement - Vit D  25 hydroxy (rtn osteoporosis monitoring)  5. Type 2 diabetes mellitus with diabetic chronic kidney disease -diet and exercise -A1c and insulin level   6. Gastroesophageal reflux disease, esophagitis presence not specified -avoid certain foods -ranitidine as needed  7. Persistent atrial fibrillation -followed by Dr. Delton SeeNelson -cont coumadin -checked at cards office  8. CVA  -nearly back to baseline -expressive aphasia improving  9. Depression screen -negative today  10. At low risk for fall -no falls reported at this time  11. Medication management  - CBC with Differential/Platelet - BASIC METABOLIC PANEL WITH GFR - Hepatic function panel - Magnesium     Plan:   During the course of the visit the patient was educated and counseled about appropriate screening and preventive services including:    Pneumococcal vaccine   Influenza vaccine  Td vaccine  Screening electrocardiogram  Bone densitometry screening  Colorectal cancer screening  Diabetes screening  Glaucoma screening  Nutrition counseling   Advanced directives: requested  Screening recommendations, referrals: Vaccinations:  Immunization History  Administered Date(s) Administered  . Influenza, High Dose Seasonal PF 03/05/2014  . Influenza-Unspecified 01/30/2013  . Pneumococcal-Unspecified 04/27/2004  . Td 06/28/2003  . Zoster 04/27/2009  Prevnar vaccine requested, Will give when we have it back in stock Hep  B vaccine not indicated  Nutrition assessed and recommended  Colonoscopy not indicated Recommended yearly ophthalmology/optometry visit for glaucoma screening and checkup Recommended yearly dental visit for hygiene and checkup Advanced directives - requested  Conditions/risks identified: BMI: Discussed weight loss, diet, and increase physical activity.  Increase physical activity: AHA recommends 150 minutes of physical activity a week.  Medications reviewed Diabetes is at goal, ACE/ARB therapy: No, Reason not on Ace Inhibitor/ARB therapy:  not indicated Urinary Incontinence is not an issue: discussed non pharmacology and pharmacology options.  Fall risk: moderate- discussed PT, home fall assessment, medications.   Subjective:    [facility]  [facility].  Date of last medicare wellness visit is unknown. This very nice 81 y.o. MWF presents for follow up with Hypertension, Hyperlipidemia, T2_NIDDM w/CKD2 and Vitamin D Deficiency.    Patient is treated for HTN & BP has been controlled at home. Today's BP: 120/72 mmHg. Patient has had no complaints of any cardiac type chest pain, palpitations, dyspnea/orthopnea/PND, dizziness, claudication, or dependent edema.   Hyperlipidemia is controlled with diet & meds. Patient denies myalgias or other med SE's. Last Lipids were Total  Chol 129; HDL 47; LDL  52; Trig 151 on 06/18/2014:   Also, the patient has history of T2_NIDDM and has had no symptoms of reactive hypoglycemia, diabetic polys, paresthesias or visual blurring.  Last A1c was 6.6% on 07/02/2014.   Further, the patient also has history of Vitamin D Deficiency and supplements vitamin D without any suspected side-effects. Last vitamin D was  66 on 06/18/2014  She did have a rash at her last visit which she had had for approximately 1 year and has not  had any issues with it since she saw Dr. Gaye Alken.   She does get mildly dizzy and lightheaded when  she stands up quickly and then passes.    Names of Other Physician/Practitioners you currently use: 1. [facility] here for primary care 2. Hecker, eye doctor, last visit twice yearly 2016 3. Dr. Irene Limbo, dentist, last visit yesterday  Patient Care Team: Lucky Cowboy, MD as PCP - General (Internal Medicine) Mateo Flow, MD as Consulting Physician (Ophthalmology) Lars Masson, MD as Consulting Physician (Cardiology) Bernette Redbird, MD as Consulting Physician (Gastroenterology) Cherlyn Roberts, MD as Consulting Physician (Dermatology) Richardean Chimera, MD as Consulting Physician (Obstetrics and Gynecology)  Medication Review: Medication Sig  . acetaminophen (TYLENOL) 325 MG tablet Take 650 mg by mouth every 4 (four) hours as needed (PAIN).   . Cholecalciferol (VITAMIN D) 2000 UNITS CAPS Take 1 capsule by mouth daily.   Marland Kitchen conjugated estrogens (PREMARIN) vaginal cream Place 1 Applicatorful vaginally daily. Use daily for 2 weeks and then use 3 days a week  . hydrochlorothiazide (HYDRODIURIL) 25 MG tablet Take 1 tablet (25 mg total) by mouth daily.  . Magnesium Hydroxide (MAGNESIA PO) Take 1 tablet by mouth.  . metoprolol (LOPRESSOR) 50 MG tablet TAKE ONE TABLET BY MOUTH TWICE DAILY  . pantoprazole (PROTONIX) 40 MG tablet TAKE ONE TABLET BY MOUTH ONCE DAILY FOR ACID INDIGESTION.  . ranitidine (ZANTAC) 300 MG tablet Take 1 tablet (300 mg total) by mouth 2 (two) times daily.  . rosuvastatin (CRESTOR) 20 MG tablet Take 1 tablet (20 mg total) by mouth every evening.  . warfarin (COUMADIN) 2 MG tablet TAKE AS DIRECTED BY ANTICOAGULATION CLINIC   Current Problems (verified) Patient Active Problem List   Diagnosis Date Noted  . GERD 09/25/2014  . Diastolic CHF 08/04/2014  . Prediabetes 07/02/2014  . Medication management 07/01/2013  . Hyperlipidemia   . Vitamin D deficiency   . T2_NIDDM w/ CKD2 (GFR 64 ml/min) 03/15/2010  . Anxiety state  03/15/2010  . Essential hypertension 03/15/2010  . Atrial fibrillation 03/15/2010  . APHASIA DUE TO CEREBROVASCULAR DISEASE 03/15/2010  . CVA  03/07/2010   Screening Tests Health Maintenance  Topic Date Due  . FOOT EXAM  12/23/1934  . OPHTHALMOLOGY EXAM  12/23/1934  . PNA vac Low Risk Adult (2 of 2 - PCV13) 04/27/2005  . TETANUS/TDAP  06/27/2013  . INFLUENZA VACCINE  12/28/2014  . HEMOGLOBIN A1C  12/31/2014  . URINE MICROALBUMIN  02/18/2015  . COLONOSCOPY  11/26/2015  . DEXA SCAN  Completed  . ZOSTAVAX  Completed   Immunization History  Administered Date(s) Administered  . Influenza, High Dose Seasonal PF 03/05/2014  . Influenza-Unspecified 01/30/2013  . Pneumococcal-Unspecified 04/27/2004  . Td 06/28/2003  . Zoster 04/27/2009   Preventative care: Last colonoscopy: 2007  History reviewed: allergies, current medications, past family history, past medical history, past social history, past surgical history and problem list  Risk Factors: Tobacco History  Substance Use Topics  . Smoking status: Never Smoker   . Smokeless tobacco: Never Used  . Alcohol Use: No   She does not smoke.  Patient is not a former smoker. Are there smokers in your home (other than you)?  No Alcohol Current alcohol use: none  Caffeine Current caffeine use: denies use  Exercise Current exercise: none  Nutrition/Diet Current diet: well balanced  Cardiac risk factors: advanced age (older than 30 for men, 55 for women), diabetes mellitus, dyslipidemia, hypertension and sedentary lifestyle.  Depression Screen (Note:  if answer to either of the following is "Yes", a more complete depression screening is indicated)   Q1: Over the past two weeks, have you felt down, depressed or hopeless? No  Q2: Over the past two weeks, have you felt little interest or pleasure in doing things? No  Have you lost interest or pleasure in daily life? No  Do you often feel hopeless? No  Do you cry easily over  simple problems? No  Activities of Daily Living In your present state of health, do you have any difficulty performing the following activities?:  Driving? No Managing money?  No Feeding yourself? No Getting from bed to chair? No Climbing a flight of stairs? No Preparing food and eating?: No Bathing or showering? No Getting dressed: No Getting to the toilet? No Using the toilet:No Moving around from place to place: No In the past year have you fallen or had a near fall?:No   Are you sexually active?  Yes  Do you have more than one partner?  No  Vision Difficulties: No  Hearing Difficulties: No Do you often ask people to speak up or repeat themselves? No Do you experience ringing or noises in your ears? No Do you have difficulty understanding soft or whispered voices? Sometimes.  Cognition  Do you feel that you have a problem with memory?No  Do you often misplace items? No  Do you feel safe at home?  Yes  Advanced directives Does patient have a Health Care Power of Attorney? No Does patient have a Living Will? No  Past Medical History  Diagnosis Date  . Atrial fibrillation   . Aphasia as late effect of cerebrovascular accident   . CVA (cerebral infarction)   . Hyperlipidemia   . HTN (hypertension)   . Anxiety   . Diabetes mellitus type II   . Hiatal hernia   . GERD (gastroesophageal reflux disease)   . IBS (irritable bowel syndrome)   . Vitamin D deficiency   . Stroke    Past Surgical History  Procedure Laterality Date  . Upper gastrointestinal endoscopy  2005  . Colonoscopy  2005  . Cholecystectomy    . Cataract extraction, bilateral    . Tonsillectomy      Review of Systems  Constitutional: Negative for fever, chills and malaise/fatigue.  HENT: Negative for congestion, ear pain, sore throat and tinnitus.   Eyes: Negative for blurred vision and double vision.  Respiratory: Negative for cough, shortness of breath and wheezing.   Cardiovascular:  Positive for leg swelling (goes away with elevation.  Patient takes fluid pill PRN). Negative for chest pain and palpitations.  Gastrointestinal: Negative for heartburn, nausea, abdominal pain, diarrhea, constipation, blood in stool and melena.  Genitourinary: Negative for dysuria, urgency, frequency and hematuria.  Musculoskeletal: Negative.   Skin: Negative.   Neurological: Negative for dizziness, tremors, loss of consciousness, weakness and headaches.  Psychiatric/Behavioral: Negative for depression. The patient is not nervous/anxious and does not have insomnia.      Objective:     BP 120/72 mmHg  Pulse 60  Temp(Src) 97 F (36.1 C)  Resp 16  Ht  (1.575 m)  Wt 120 lb 3.2 oz (54.522 kg)  BMI 21.98 kg/m2  General Appearance: Well nourished, alert, WD/WN, female and in no apparent distress. Eyes: PERRLA, EOMs, conjunctiva no swelling or erythema, normal fundi and vessels. Sinuses: No frontal/maxillary tenderness ENT/Mouth: EACs patent / TMs  nl. Nares clear without erythema, swelling, mucoid exudates. Oral hygiene is good. No  erythema, swelling, or exudate. Tongue normal, non-obstructing. Tonsils not swollen or erythematous. Hearing normal.  Neck: Supple, thyroid normal. No bruits, nodes or JVD. Respiratory: Respiratory effort normal.  BS equal and clear bilateral without rales, rhonci, wheezing or stridor. Cardio: Heart sounds are normal with regular rate and rhythm and no murmurs, rubs or gallops. Peripheral pulses are normal and equal bilaterally without edema. No aortic or femoral bruits. Chest: symmetric with normal excursions and percussion. Breasts: Symmetric, without lumps, nipple discharge, retractions, or fibrocystic changes.  Abdomen: Flat, soft  with nl bowel sounds. Nontender, no guarding, rebound, hernias, masses, or organomegaly.  Lymphatics: Non tender without lymphadenopathy.  Genitourinary:  Musculoskeletal: Full ROM all peripheral extremities, joint stability,  5/5 strength, and normal gait. Skin: Warm and dry without rashes, lesions, cyanosis, clubbing or  ecchymosis.  Neuro: Cranial nerves intact, reflexes equal bilaterally. Normal muscle tone, no cerebellar symptoms. Sensation intact.  Pysch: Alert and oriented X 3, normal affect, Insight and Judgment appropriate.   Cognitive Testing  Alert? Yes  Normal Appearance?Yes  Oriented to person? Yes  Place? Yes   Time? Yes  Recall of three objects?  Yes  Can perform simple calculations? Yes  Displays appropriate judgment? Yes  Can read the correct time from a watch/clock?Yes  Medicare Attestation I have personally reviewed: The patient's medical and social history Their use of alcohol, tobacco or illicit drugs Their current medications and supplements The patient's functional ability including ADLs,fall risks, home safety risks, cognitive, and hearing and visual impairment Diet and physical activities Evidence for depression or mood disorders  The patient's weight, height, BMI, and visual acuity have been recorded in the chart.  I have made referrals, counseling, and provided education to the patient based on review of the above and I have provided the patient with a written personalized care plan for preventive services.  Over 40 minutes of exam, counseling, chart review was performed.  Terri PiedraFORCUCCI, COURTNEY, PA-C   10/01/2014

## 2014-10-01 NOTE — Patient Instructions (Addendum)
Orthostatic Hypotension Orthostatic hypotension is a sudden drop in blood pressure. It happens when you quickly stand up from a seated or lying position. You may feel dizzy or light-headed. This can last for just a few seconds or for up to a few minutes. It is usually not a serious problem. However, if this happens frequently or gets worse, it can be a sign of something more serious. CAUSES  Different things can cause orthostatic hypotension, including:   Loss of body fluids (dehydration).  Medicines that lower blood pressure.  Sudden changes in posture, such as standing up quickly after you have been sitting or lying down.  Taking too much of your medicine. SIGNS AND SYMPTOMS   Light-headedness or dizziness.   Fainting or near-fainting.   A fast heart rate.   Weakness.   Feeling tired (fatigue).  DIAGNOSIS  Your health care provider may do several things to help diagnose your condition and identify the cause. These may include:   Taking a medical history and doing a physical exam.  Checking your blood pressure. Your health care provider will check your blood pressure when you are:  Lying down.  Sitting.  Standing.  Using tilt table testing. In this test, you lie down on a table that moves from a lying position to a standing position. You will be strapped onto the table. This test monitors your blood pressure and heart rate when you are in different positions. TREATMENT  Treatment will vary depending on the cause. Possible treatments include:   Changing the dosage of your medicines.  Wearing compression stockings on your lower legs.  Standing up slowly after sitting or lying down.  Eating more salt.  Eating frequent, small meals.  In some cases, getting IV fluids.  Taking medicine to enhance fluid retention. HOME CARE INSTRUCTIONS  Only take over-the-counter or prescription medicines as directed by your health care provider.  Follow your health care  provider's instructions for changing the dosage of your current medicines.  Do not stop or adjust your medicine on your own.  Stand up slowly after sitting or lying down. This allows your body to adjust to the different position.  Wear compression stockings as directed.  Eat extra salt as directed.  Do not add extra salt to your diet unless directed to by your health care provider.  Eat frequent, small meals.  Avoid standing suddenly after eating.  Avoid hot showers or excessive heat as directed by your health care provider.  Keep all follow-up appointments. SEEK MEDICAL CARE IF:  You continue to feel dizzy or light-headed after standing.  You feel groggy or confused.  You feel cold, clammy, or sick to your stomach (nauseous).  You have blurred vision.  You feel short of breath. SEEK IMMEDIATE MEDICAL CARE IF:   You faint after standing.  You have chest pain.  You have difficulty breathing.   You lose feeling or movement in your arms or legs.   You have slurred speech or difficulty talking, or you are unable to talk.  MAKE SURE YOU:   Understand these instructions.  Will watch your condition.  Will get help right away if you are not doing well or get worse. Document Released: 05/05/2002 Document Revised: 05/20/2013 Document Reviewed: 03/07/2013 [facility] Patient Information 2015 [place]. This information is not intended to replace advice given to you by your health care provider. Make sure you discuss any questions you have with your health care provider.   ++++++++++++++++++++++++++++++++++  Recommend Low dose or  baby Aspirin 81 mg daily   To reduce risk of Colon Cancer 20 %, Skin Cancer 26 % , Melanoma 46% and   Pancreatic cancer 60%  +++++++++++++++++++++++++++++++++ Vitamin D goal is between 70-100.   Please make sure that you are taking your Vitamin D as directed.   It is very important as a natural antiinflammatory   helping hair,  skin, and nails, as well as reducing stroke and heart attack risk.   It helps your bones and helps with mood.  It also decreases numerous cancer risks so please take it as directed.   Low Vit D is associated with a 200-300% higher risk for CANCER   and 200-300% higher risk for HEART   ATTACK  &  STROKE.    ..................................................................................Marland Kitchen.  It is also associated with higher death rate at younger ages,   autoimmune diseases like Rheumatoid arthritis, Lupus, Multiple Sclerosis.     Also many other serious conditions, like depression, Alzheimer's  Dementia, infertility, muscle aches, fatigue, fibromyalgia - just to name a few.  ++++++++++++++++++++++++++++++++++++++++ Recommend the book "The END of DIETING" by Dr Monico HoarJoel Fuhrman   & the book "The END of DIABETES " by Dr Monico HoarJoel Fuhrman  At [email or web address] - get book & Audio CD's     Being diabetic has a  300% increased risk for heart attack, stroke, cancer, and alzheimer- type vascular dementia. It is very important that you work harder with diet by avoiding all foods that are white. Avoid white rice (brown & wild rice is OK), white potatoes (sweetpotatoes in moderation is OK), White bread or wheat bread or anything made out of white flour like bagels, donuts, rolls, buns, biscuits, cakes, pastries, cookies, pizza crust, and pasta (made from white flour & egg whites) - vegetarian pasta or spinach or wheat pasta is OK. Multigrain breads like Arnold's or Pepperidge Farm, or multigrain sandwich thins or flatbreads.  Diet, exercise and weight loss can reverse and cure diabetes in the early stages.  Diet, exercise and weight loss is very important in the control and prevention of complications of diabetes which affects every system in your body, ie. Brain - dementia/stroke, eyes - glaucoma/blindness, heart - heart attack/heart failure, kidneys - dialysis, stomach - gastric paralysis, intestines -  malabsorption, nerves - severe painful neuritis, circulation - gangrene & loss of a leg(s), and finally cancer and Alzheimers.    I recommend avoid fried & greasy foods,  sweets/candy, white rice (brown or wild rice or Quinoa is OK), white potatoes (sweet potatoes are OK) - anything made from white flour - bagels, doughnuts, rolls, buns, biscuits,white and wheat breads, pizza crust and traditional pasta made of white flour & egg white(vegetarian pasta or spinach or wheat pasta is OK).  Multi-grain bread is OK - like multi-grain flat bread or sandwich thins. Avoid alcohol in excess. Exercise is also important.    Eat all the vegetables you want - avoid meat, especially red meat and dairy - especially cheese.  Cheese is the most concentrated form of trans-fats which is the worst thing to clog up our arteries. Veggie cheese is OK which can be found in the fresh produce section at [facility] or [employer] or [employer]  ++++++++++++++++++++++++++++++++++++++++++++++++++++++++

## 2014-10-02 LAB — INSULIN, RANDOM: Insulin: 15 u[IU]/mL (ref 2.0–19.6)

## 2014-10-02 LAB — VITAMIN D 25 HYDROXY (VIT D DEFICIENCY, FRACTURES): VIT D 25 HYDROXY: 66 ng/mL (ref 30–100)

## 2014-10-02 NOTE — Addendum Note (Signed)
Addended by: Terri PiedraFORCUCCI, Oleg Oleson A on: 10/02/2014 08:49 AM   Modules accepted: Level of Service

## 2014-10-21 ENCOUNTER — Ambulatory Visit: Payer: Medicare Other | Admitting: Cardiology

## 2014-10-22 ENCOUNTER — Ambulatory Visit (INDEPENDENT_AMBULATORY_CARE_PROVIDER_SITE_OTHER): Payer: Medicare Other

## 2014-10-22 DIAGNOSIS — I4819 Other persistent atrial fibrillation: Secondary | ICD-10-CM

## 2014-10-22 DIAGNOSIS — I481 Persistent atrial fibrillation: Secondary | ICD-10-CM

## 2014-10-22 DIAGNOSIS — I4891 Unspecified atrial fibrillation: Secondary | ICD-10-CM

## 2014-10-22 DIAGNOSIS — I6350 Cerebral infarction due to unspecified occlusion or stenosis of unspecified cerebral artery: Secondary | ICD-10-CM

## 2014-10-22 DIAGNOSIS — I639 Cerebral infarction, unspecified: Secondary | ICD-10-CM | POA: Diagnosis not present

## 2014-10-22 DIAGNOSIS — Z79899 Other long term (current) drug therapy: Secondary | ICD-10-CM | POA: Diagnosis not present

## 2014-10-22 LAB — POCT INR: INR: 2.8

## 2014-11-26 ENCOUNTER — Ambulatory Visit (INDEPENDENT_AMBULATORY_CARE_PROVIDER_SITE_OTHER): Payer: Medicare Other | Admitting: Cardiology

## 2014-11-26 ENCOUNTER — Encounter: Payer: Self-pay | Admitting: Cardiology

## 2014-11-26 VITALS — BP 114/80 | HR 56 | Ht 62.0 in | Wt 125.0 lb

## 2014-11-26 DIAGNOSIS — I10 Essential (primary) hypertension: Secondary | ICD-10-CM

## 2014-11-26 DIAGNOSIS — I5032 Chronic diastolic (congestive) heart failure: Secondary | ICD-10-CM | POA: Diagnosis not present

## 2014-11-26 DIAGNOSIS — Z7901 Long term (current) use of anticoagulants: Secondary | ICD-10-CM | POA: Diagnosis not present

## 2014-11-26 DIAGNOSIS — I5033 Acute on chronic diastolic (congestive) heart failure: Secondary | ICD-10-CM

## 2014-11-26 DIAGNOSIS — R600 Localized edema: Secondary | ICD-10-CM

## 2014-11-26 MED ORDER — POTASSIUM CHLORIDE ER 10 MEQ PO TBCR: 10.0000 meq | EXTENDED_RELEASE_TABLET | Freq: Every day | ORAL | Status: DC

## 2014-11-26 MED ORDER — FUROSEMIDE 20 MG PO TABS: 20.0000 mg | ORAL_TABLET | Freq: Every day | ORAL | Status: DC

## 2014-11-26 NOTE — Patient Instructions (Signed)
Medication Instructions:   STOP HYDROCHLOROTHIAZIDE NOW   START LASIX 20 MG ONCE DAILY  START POTASSIUM CHLORIDE 10 mEq ONCE DAILY    Labwork:  BMET---CHECK PRIOR TO YOUR 4 MONTH FOLLOW-UP APPOINTMENT WITH DR Delton SeeNELSON      Follow-Up:  4 MONTHS WITH DR NELSON---PLEASE HAVE YOUR LAB APPOINTMENT SCHEDULED AND DONE PRIOR TO THIS VISIT

## 2014-11-26 NOTE — Progress Notes (Signed)
Patient ID: NIJIA BRITTEN, female   DOB: [birth year], 81 y.o.   MRN: [id number] Patient ID: GRETCHYN BRACKINS, female   DOB: 1924-05-31, 81 y.o.   MRN: [id number]    Patient Name: ARBIE DERDERIAN Date of Encounter: 11/26/2014  Primary Care Provider:  Nadean Corwin, MD Primary Cardiologist:  Lars Masson   Problem List   Past Medical History  Diagnosis Date  . Atrial fibrillation   . Aphasia as late effect of cerebrovascular accident   . CVA (cerebral infarction)   . Hyperlipidemia   . HTN (hypertension)   . Anxiety   . Diabetes mellitus type II   . Hiatal hernia   . GERD (gastroesophageal reflux disease)   . IBS (irritable bowel syndrome)   . Vitamin D deficiency   . Stroke    Past Surgical History  Procedure Laterality Date  . Upper gastrointestinal endoscopy  2005  . Colonoscopy  2005  . Cholecystectomy    . Cataract extraction, bilateral    . Tonsillectomy      Allergies  Allergies  Allergen Reactions  . Ace Inhibitors     Cough  . Fosamax [Alendronate Sodium]     Heart burn  . Norvasc [Amlodipine Besylate]     edema  . Zocor [Simvastatin]     Elevated CPK   CC: LE edema  HPI  Mrs. Bassette is a pleasant 81 year old female with h/o chronic A. Fib and anticoagulation post CVA in 2010. She has been stable since then, no recurrent stroke, no DOE, CP, LE edema or DOE or palpitations.  She very compliant with her medications. She is complaining of lower abdominal pain that progresses to epigastric pain and retrosternal burning. She used to experience it daily after dinner, but recently it has become less frequent. She denies change in apettite, diarhea, bloody or black tarry stools.  She has h/o GERD and acute ischemic colitis 2 years ago, it seems that she didn't follow with a gastroenterologist since then.  06/24/2014 - the patient had an episode of colitis 1 week ago, for which she received metronidazole and ciprofloxacine. She has noticed  LE edema since she started it. IT was discontinued. She has noticed worsening SOB, no chest pain, no palpitations. Two falls in the last year, both mechanical. Complaint with her meds.   07/24/2014 - the patient is coming after 3 weeks, LE edema has improved, she is experiencing difficulty swallowing large KCl pills and having diarrhea. Otherwise denies DOE, orthopnea, or PND.   11/26/2014 - the patient is coming after 4 months, her complain is LE edema that developed 1 month ago and stays all the time. No orthopnea, PND, some palpitations, no dizziness. No chest pain.no bleeding complications with warfarin, no falls.   Home Medications  Prior to Admission medications   Medication Sig Start Date End Date Taking? Authorizing Provider  acetaminophen (TYLENOL) 325 MG tablet Take 650 mg by mouth every 4 (four) hours as needed.     Yes Historical Provider, MD  alum hydroxide-mag trisilicate (ANTACID) 80-20 MG CHEW Chew 1 tablet by mouth 3 (three) times daily as needed.   Yes Historical Provider, MD  Cholecalciferol (VITAMIN D) 2000 UNITS CAPS Take 2 capsules by mouth daily.    Yes Historical Provider, MD  hydrochlorothiazide (HYDRODIURIL) 25 MG tablet TAKE ONE TABLET BY MOUTH IN THE MORNING FOR BLOOD PRESSURE AND FLUID 05/30/13  Yes Lucky Cowboy, MD  HYDROcodone-acetaminophen (NORCO/VICODIN) 5-325 MG per tablet Take 1-2 tablets by mouth  every 4 (four) hours as needed. 03/17/12  Yes Dorothea Ogle, MD  metoprolol (LOPRESSOR) 50 MG tablet Take 50 mg by mouth 2 (two) times daily.     Yes Historical Provider, MD  pantoprazole (PROTONIX) 40 MG tablet Take 1 tablet (40 mg total) by mouth daily. 03/17/12  Yes Dorothea Ogle, MD  polyethylene glycol (MIRALAX / Ethelene Hal) packet Take 17 g by mouth daily. 03/17/12  Yes Dorothea Ogle, MD  rosuvastatin (CRESTOR) 20 MG tablet Take 1 tablet (20 mg total) by mouth every evening. 05/26/13  Yes Quentin Mulling, PA-C  warfarin (COUMADIN) 2 MG tablet Take as directed by  anticoagulation clinic 12/24/12  Yes Gaylord Shih, MD   Family History  Family History  Problem Relation Age of Onset  . Diabetes Mother   . Stroke Mother   . Hypertension Brother   . Heart disease Brother   . Arthritis Sister     Rhematoid  . Arthritis Sister     Rhematoid   Social History  History   Social History  . Marital Status: Married    Spouse Name: N/A  . Number of Children: N/A  . Years of Education: N/A   Occupational History  . Not on file.   Social History Main Topics  . Smoking status: Never Smoker   . Smokeless tobacco: Never Used  . Alcohol Use: No  . Drug Use: No  . Sexual Activity: No   Other Topics Concern  . Not on file   Social History Narrative    Review of Systems, as per HPI, otherwise negative General:  No chills, fever, night sweats or weight changes.  Cardiovascular:  No chest pain, dyspnea on exertion, edema, orthopnea, palpitations, paroxysmal nocturnal dyspnea. Dermatological: No rash, lesions/masses Respiratory: No cough, dyspnea Urologic: No hematuria, dysuria Abdominal:   No nausea, vomiting, diarrhea, bright red blood per rectum, melena, or hematemesis Neurologic:  No visual changes, wkns, changes in mental status. All other systems reviewed and are otherwise negative except as noted above.  Physical Exam  Blood pressure 114/80, pulse 56, height 5\' 2"  (1.575 m), weight 125 lb (56.7 kg).  General: Pleasant, NAD Psych: Normal affect. Neuro: Alert and oriented X 3. Moves all extremities spontaneously. HEENT: Normal  Neck: Supple without bruits or JVD. Lungs:  Resp regular and unlabored, CTA. Heart: RRR no s3, s4, or murmurs. Abdomen: Soft, non-tender, non-distended, BS + x 4.  Extremities: No clubbing, cyanosis , trivial peri-malleolar edema, . DP/PT/Radials 2+ and equal bilaterally.  Labs:  No results for input(s): CKTOTAL, CKMB, TROPONINI in the last 72 hours. Lab Results  Component Value Date   WBC 6.7 10/01/2014     HGB 15.2* 10/01/2014   HCT 44.9 10/01/2014   MCV 92.0 10/01/2014   PLT 206 10/01/2014    No results for input(s): NA, K, CL, CO2, BUN, CREATININE, CALCIUM, PROT, BILITOT, ALKPHOS, ALT, AST, GLUCOSE in the last 168 hours.  Invalid input(s): LABALBU Lab Results  Component Value Date   CHOL 108 10/01/2014   HDL 44* 10/01/2014   LDLCALC 40 10/01/2014   TRIG 118 10/01/2014   No results found for: DDIMER Invalid input(s): POCBNP  Accessory Clinical Findings  Echocardiogram - 2011 Left ventricle: The cavity size was normal. Systolic function was  normal. The estimated ejection fraction was in the range of 55% to  60%. Wall motion was normal; there were no regional wall motion  abnormalities. Doppler parameters are consistent with abnormal  left ventricular relaxation (grade 1  diastolic dysfunction).  Doppler parameters are consistent with elevated mean left atrial  filling pressure. - Aortic valve: Mild regurgitation. - Mitral valve: Moderately calcified annulus. Mild regurgitation. - Right ventricle: Systolic pressure was increased. - Right atrium: The atrium was mildly dilated. - Tricuspid valve: Mild regurgitation. - Pulmonary arteries: PA peak pressure: 46mm Hg (S). Impressions:  - The right ventricular systolic pressure was increased consistent  with moderate pulmonary hypertension.  ECG - Atrial fibrillation, non-specific ST-T wave changes  Procedures/Studies:  Ct Abdomen Pelvis W Contrast  03/12/2012  IMPRESSION:  1. Marked wall thickening and mucosal enhancement in the descending and sigmoid colon, compatible with severe colitis. Surrounding stranding and mild ascites. The celiac trunk and mesenteric arteries appear patent, and no intramural gas is observed in the colon. This colitis is probably infectious rather than ischemic, and if the patient has been on recent antibiotics then checking for C difficile toxins may be warranted. I doubt that  the appearance is due to bowel wall hematoma.  2. Large hiatal hernia, chronic.    Echocardiogram 02/22/2013  - Left ventricle: The cavity size was normal. Systolic function was normal. The estimated ejection fraction was in the range of 55% to 60%. Wall motion was normal; there were no regional wall motion abnormalities. Doppler parameters are consistent with abnormal left ventricular relaxation (grade 1 diastolic dysfunction). Doppler parameters are consistent with elevated mean left atrial filling pressure. - Aortic valve: Mild regurgitation. - Mitral valve: Moderately calcified annulus. Mild regurgitation. - Right ventricle: Systolic pressure was increased. - Right atrium: The atrium was mildly dilated. - Tricuspid valve: Mild regurgitation. - Pulmonary arteries: PA peak pressure: 46mm Hg (S). Impressions:  - The right ventricular systolic pressure was increased consistent with moderate pulmonary hypertension.    Assessment & Plan  1. Acute diastolic CHF - D/C HCTZ and start Lasix 20 mg po daily with KCL 10 mEq daily, Crea o.9, Follow up in 4 months with BMP prior to the visit.  2. Chronic a-fib, h/o CVA on chronic anticoagulation with coumadine - rate controlled, complaint to her meds, no more strokes  3. Hypertension - well controlled  4. Hyperlipidemia - at goal on labs from 09/2014.  5. Pulmonary hypertension - the patient is asymptomatic and well compensated  6. Abdominal pain - episodes of colitis, s/p ATB treatment 1 week ago.   Follow up in 4 months with BMP prior to the visit.   Lars Masson, MD, [facility] 11/26/2014, 9:26 AM

## 2014-12-01 ENCOUNTER — Other Ambulatory Visit: Payer: Self-pay | Admitting: *Deleted

## 2014-12-01 MED ORDER — ROSUVASTATIN CALCIUM 20 MG PO TABS: 20.0000 mg | ORAL_TABLET | Freq: Every evening | ORAL | Status: DC

## 2014-12-03 ENCOUNTER — Ambulatory Visit (INDEPENDENT_AMBULATORY_CARE_PROVIDER_SITE_OTHER): Payer: Medicare Other | Admitting: *Deleted

## 2014-12-03 DIAGNOSIS — Z79899 Other long term (current) drug therapy: Secondary | ICD-10-CM

## 2014-12-03 DIAGNOSIS — I6350 Cerebral infarction due to unspecified occlusion or stenosis of unspecified cerebral artery: Secondary | ICD-10-CM

## 2014-12-03 DIAGNOSIS — I4891 Unspecified atrial fibrillation: Secondary | ICD-10-CM | POA: Diagnosis not present

## 2014-12-03 DIAGNOSIS — I481 Persistent atrial fibrillation: Secondary | ICD-10-CM | POA: Diagnosis not present

## 2014-12-03 DIAGNOSIS — I639 Cerebral infarction, unspecified: Secondary | ICD-10-CM | POA: Diagnosis not present

## 2014-12-03 DIAGNOSIS — I4819 Other persistent atrial fibrillation: Secondary | ICD-10-CM

## 2014-12-03 LAB — POCT INR: INR: 2.5

## 2014-12-07 ENCOUNTER — Other Ambulatory Visit: Payer: Self-pay | Admitting: Internal Medicine

## 2014-12-29 ENCOUNTER — Other Ambulatory Visit: Payer: Self-pay | Admitting: Cardiology

## 2015-01-07 ENCOUNTER — Other Ambulatory Visit: Payer: Self-pay | Admitting: Physician Assistant

## 2015-01-07 ENCOUNTER — Ambulatory Visit (INDEPENDENT_AMBULATORY_CARE_PROVIDER_SITE_OTHER): Payer: Medicare Other

## 2015-01-07 DIAGNOSIS — I481 Persistent atrial fibrillation: Secondary | ICD-10-CM | POA: Diagnosis not present

## 2015-01-07 DIAGNOSIS — I4891 Unspecified atrial fibrillation: Secondary | ICD-10-CM | POA: Diagnosis not present

## 2015-01-07 DIAGNOSIS — I6350 Cerebral infarction due to unspecified occlusion or stenosis of unspecified cerebral artery: Secondary | ICD-10-CM

## 2015-01-07 DIAGNOSIS — I639 Cerebral infarction, unspecified: Secondary | ICD-10-CM | POA: Diagnosis not present

## 2015-01-07 DIAGNOSIS — I4819 Other persistent atrial fibrillation: Secondary | ICD-10-CM

## 2015-01-07 DIAGNOSIS — Z79899 Other long term (current) drug therapy: Secondary | ICD-10-CM | POA: Diagnosis not present

## 2015-01-07 LAB — POCT INR: INR: 1.8

## 2015-01-25 ENCOUNTER — Other Ambulatory Visit: Payer: Self-pay | Admitting: Internal Medicine

## 2015-01-28 ENCOUNTER — Ambulatory Visit (INDEPENDENT_AMBULATORY_CARE_PROVIDER_SITE_OTHER): Payer: Medicare Other

## 2015-01-28 DIAGNOSIS — I639 Cerebral infarction, unspecified: Secondary | ICD-10-CM | POA: Diagnosis not present

## 2015-01-28 DIAGNOSIS — Z79899 Other long term (current) drug therapy: Secondary | ICD-10-CM

## 2015-01-28 DIAGNOSIS — I4891 Unspecified atrial fibrillation: Secondary | ICD-10-CM | POA: Diagnosis not present

## 2015-01-28 DIAGNOSIS — I481 Persistent atrial fibrillation: Secondary | ICD-10-CM | POA: Diagnosis not present

## 2015-01-28 DIAGNOSIS — I4819 Other persistent atrial fibrillation: Secondary | ICD-10-CM

## 2015-01-28 DIAGNOSIS — I6350 Cerebral infarction due to unspecified occlusion or stenosis of unspecified cerebral artery: Secondary | ICD-10-CM

## 2015-01-28 LAB — POCT INR: INR: 2.9

## 2015-02-02 ENCOUNTER — Other Ambulatory Visit: Payer: Self-pay | Admitting: Internal Medicine

## 2015-02-06 ENCOUNTER — Other Ambulatory Visit: Payer: Self-pay | Admitting: Physician Assistant

## 2015-02-06 ENCOUNTER — Other Ambulatory Visit: Payer: Self-pay | Admitting: Internal Medicine

## 2015-02-18 ENCOUNTER — Ambulatory Visit (INDEPENDENT_AMBULATORY_CARE_PROVIDER_SITE_OTHER): Payer: Medicare Other | Admitting: Internal Medicine

## 2015-02-18 ENCOUNTER — Encounter: Payer: Self-pay | Admitting: Internal Medicine

## 2015-02-18 VITALS — BP 110/82 | HR 76 | Temp 97.9°F | Resp 16 | Ht 61.5 in | Wt 122.8 lb

## 2015-02-18 DIAGNOSIS — Z6822 Body mass index (BMI) 22.0-22.9, adult: Secondary | ICD-10-CM

## 2015-02-18 DIAGNOSIS — Z1389 Encounter for screening for other disorder: Secondary | ICD-10-CM | POA: Diagnosis not present

## 2015-02-18 DIAGNOSIS — M7072 Other bursitis of hip, left hip: Secondary | ICD-10-CM

## 2015-02-18 DIAGNOSIS — Z0000 Encounter for general adult medical examination without abnormal findings: Secondary | ICD-10-CM

## 2015-02-18 DIAGNOSIS — I10 Essential (primary) hypertension: Secondary | ICD-10-CM | POA: Diagnosis not present

## 2015-02-18 DIAGNOSIS — Z9181 History of falling: Secondary | ICD-10-CM

## 2015-02-18 DIAGNOSIS — E559 Vitamin D deficiency, unspecified: Secondary | ICD-10-CM

## 2015-02-18 DIAGNOSIS — I639 Cerebral infarction, unspecified: Secondary | ICD-10-CM

## 2015-02-18 DIAGNOSIS — Z789 Other specified health status: Secondary | ICD-10-CM | POA: Diagnosis not present

## 2015-02-18 DIAGNOSIS — Z1212 Encounter for screening for malignant neoplasm of rectum: Secondary | ICD-10-CM

## 2015-02-18 DIAGNOSIS — I4891 Unspecified atrial fibrillation: Secondary | ICD-10-CM

## 2015-02-18 DIAGNOSIS — N189 Chronic kidney disease, unspecified: Secondary | ICD-10-CM

## 2015-02-18 DIAGNOSIS — Z79899 Other long term (current) drug therapy: Secondary | ICD-10-CM

## 2015-02-18 DIAGNOSIS — Z23 Encounter for immunization: Secondary | ICD-10-CM

## 2015-02-18 DIAGNOSIS — I5030 Unspecified diastolic (congestive) heart failure: Secondary | ICD-10-CM

## 2015-02-18 DIAGNOSIS — E1122 Type 2 diabetes mellitus with diabetic chronic kidney disease: Secondary | ICD-10-CM

## 2015-02-18 DIAGNOSIS — M7071 Other bursitis of hip, right hip: Secondary | ICD-10-CM

## 2015-02-18 DIAGNOSIS — E785 Hyperlipidemia, unspecified: Secondary | ICD-10-CM

## 2015-02-18 DIAGNOSIS — K219 Gastro-esophageal reflux disease without esophagitis: Secondary | ICD-10-CM

## 2015-02-18 DIAGNOSIS — Z1331 Encounter for screening for depression: Secondary | ICD-10-CM

## 2015-02-18 LAB — LIPID PANEL
CHOL/HDL RATIO: 3.5 ratio (ref <=5.0)
CHOLESTEROL: 117 mg/dL — AB (ref 125–200)
HDL: 33 mg/dL — AB (ref >=46)
LDL Cholesterol: 54 mg/dL (ref <130)
TRIGLYCERIDES: 149 mg/dL (ref <150)
VLDL: 30 mg/dL (ref <30)

## 2015-02-18 LAB — CBC WITH DIFFERENTIAL/PLATELET
BASOS ABS: 0 10*3/uL (ref 0.0–0.1)
BASOS PCT: 0 % (ref 0–1)
EOS ABS: 0.2 10*3/uL (ref 0.0–0.7)
EOS PCT: 3 % (ref 0–5)
HCT: 45.8 % (ref 36.0–46.0)
Hemoglobin: 15.2 g/dL — ABNORMAL HIGH (ref 12.0–15.0)
Lymphocytes Relative: 35 % (ref 12–46)
Lymphs Abs: 2.3 10*3/uL (ref 0.7–4.0)
MCH: 30.9 pg (ref 26.0–34.0)
MCHC: 33.2 g/dL (ref 30.0–36.0)
MCV: 93.1 fL (ref 78.0–100.0)
MONOS PCT: 6 % (ref 3–12)
MPV: 10.1 fL (ref 8.6–12.4)
Monocytes Absolute: 0.4 10*3/uL (ref 0.1–1.0)
NEUTROS PCT: 56 % (ref 43–77)
Neutro Abs: 3.8 10*3/uL (ref 1.7–7.7)
PLATELETS: 213 10*3/uL (ref 150–400)
RBC: 4.92 MIL/uL (ref 3.87–5.11)
RDW: 13.5 % (ref 11.5–15.5)
WBC: 6.7 10*3/uL (ref 4.0–10.5)

## 2015-02-18 LAB — BASIC METABOLIC PANEL WITH GFR
BUN: 17 mg/dL (ref 7–25)
CHLORIDE: 103 mmol/L (ref 98–110)
CO2: 26 mmol/L (ref 20–31)
CREATININE: 0.86 mg/dL (ref 0.60–0.88)
Calcium: 9.6 mg/dL (ref 8.6–10.4)
GFR, EST AFRICAN AMERICAN: 70 mL/min (ref >=60)
GFR, Est Non African American: 61 mL/min (ref >=60)
Glucose, Bld: 167 mg/dL — ABNORMAL HIGH (ref 65–99)
Potassium: 4 mmol/L (ref 3.5–5.3)
SODIUM: 143 mmol/L (ref 135–146)

## 2015-02-18 LAB — MAGNESIUM: MAGNESIUM: 1.7 mg/dL (ref 1.5–2.5)

## 2015-02-18 LAB — HEPATIC FUNCTION PANEL
ALT: 15 U/L (ref 6–29)
AST: 20 U/L (ref 10–35)
Albumin: 4 g/dL (ref 3.6–5.1)
Alkaline Phosphatase: 93 U/L (ref 33–130)
BILIRUBIN DIRECT: 0.2 mg/dL (ref <=0.2)
BILIRUBIN TOTAL: 0.7 mg/dL (ref 0.2–1.2)
Indirect Bilirubin: 0.5 mg/dL (ref 0.2–1.2)
Total Protein: 6.7 g/dL (ref 6.1–8.1)

## 2015-02-18 MED ORDER — PREDNISONE 20 MG PO TABS: ORAL_TABLET | ORAL | Status: DC

## 2015-02-18 NOTE — Progress Notes (Signed)
Patient ID: Lisa Hopkins, female   DOB: 1925-04-23, 81 y.o.   MRN: [id number]   Comprehensive Examination  This very nice 81 y.o. MWF presents for complete evakluation & examination.  Patient has been followed for HTN,ASCVD hx/o CVA, T2_NIDDM  W/CKD 2, Hyperlipidemia, and Vitamin D Deficiency.    HTN predates since  90. In 1992 , patient had a normal heart cath. 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 Stroke sequela persists with mild dysnomia/aphasia.  Patient's BP has been controlled at home and patient denies any cardiac symptoms as chest pain, palpitations, shortness of breath, dizziness or ankle swelling. Today's BP: 110/82 mmHg    Patient's hyperlipidemia is controlled with diet and medications. Patient denies myalgias or other medication SE's. Last lipids were at goal - Cholesterol 108; HDL 44*; LDL 40; Triglycerides 118 on 10/01/2014:   Patient has T2_NIDDM w/ CKD 2 (GFR 64 ml/min) dx'd in 2003 and patient denies reactive hypoglycemic symptoms, visual blurring, diabetic polys, or paresthesias. Last A1c was 6.6% on 10/01/2014.    Finally, patient has history of Vitamin D Deficiency of 35 in 2011 and last Vitamin D was 66 on 10/01/2014.      Medication Sig  . acetaminophen (TYLENOL) 325 MG tablet Take 650 mg by mouth every 4 (four) hours as needed (PAIN).   Marland Kitchen VITAMIN D 2000 UNITS  Take 1 capsule by mouth daily.   . furosemide (LASIX) 20 MG tablet Take 1 tablet (20 mg total) by mouth daily.  . Magnesium Hydroxide (MAGNESIA ) Take 1 tablet by mouth.  . metoprolol (LOPRESSOR) 50 MG tablet TAKE ONE TABLET BY MOUTH TWICE DAILY  . pantoprazole (PROTONIX) 40 MG tablet TAKE ONE TABLET BY MOUTH ONCE DAILY FOR ACID INDIGESTION.  Marland Kitchen potassium chloride (K-DUR) 10 MEQ tablet Take 1 tablet (10 mEq total) by mouth daily.  . ranitidine (ZANTAC) 300 MG tablet TAKE ONE TABLET BY MOUTH TWICE DAILY  . rosuvastatin (CRESTOR) 20 MG tablet Take 1 tablet (20 mg total) by mouth every evening.  . warfarin  (COUMADIN) 2 MG tablet TAKE AS DIRECTED BY ANTICOAGULATION CLINIC   Allergies  Allergen Reactions  . Ace Inhibitors     Cough  . Fosamax [Alendronate Sodium]     Heart burn  . Norvasc [Amlodipine Besylate]     edema  . Zocor [Simvastatin]     Elevated CPK   Past Medical History  Diagnosis Date  . Atrial fibrillation   . Aphasia as late effect of cerebrovascular accident   . CVA (cerebral infarction)   . Hyperlipidemia   . HTN (hypertension)   . Anxiety   . Diabetes mellitus type II   . Hiatal hernia   . GERD (gastroesophageal reflux disease)   . IBS (irritable bowel syndrome)   . Vitamin D deficiency   . Stroke    Health Maintenance  Topic Date Due  . FOOT EXAM  12/23/1934  . OPHTHALMOLOGY EXAM  12/23/1934  . PNA vac Low Risk Adult (2 of 2 - PCV13) 04/27/2005  . TETANUS/TDAP  06/27/2013  . INFLUENZA VACCINE  12/28/2014  . URINE MICROALBUMIN  02/18/2015  . HEMOGLOBIN A1C  04/03/2015  . COLONOSCOPY  11/26/2015  . DEXA SCAN  Completed  . ZOSTAVAX  Completed   Immunization History  Administered Date(s) Administered  . Influenza, High Dose Seasonal PF 03/05/2014  . Influenza-Unspecified 01/30/2013  . Pneumococcal-Unspecified 04/27/2004  . Td 06/28/2003  . Zoster 04/27/2009   Past Surgical History  Procedure Laterality Date  .  Upper gastrointestinal endoscopy  2005  . Colonoscopy  2005  . Cholecystectomy    . Cataract extraction, bilateral    . Tonsillectomy     Family History  Problem Relation Age of Onset  . Diabetes Mother   . Stroke Mother   . Hypertension Brother   . Heart disease Brother   . Arthritis Sister     Rhematoid  . Arthritis Sister     Rhematoid   Social History  Substance Use Topics  . Smoking status: Never Smoker   . Smokeless tobacco: Never Used  . Alcohol Use: No    ROS Constitutional: Denies fever, chills, weight loss/gain, headaches, insomnia,  night sweats, and change in appetite. Does c/o fatigue. Eyes: Denies redness,  blurred vision, diplopia, discharge, itchy, watery eyes.  ENT: Denies discharge, congestion, post nasal drip, epistaxis, sore throat, earache, hearing loss, dental pain, Tinnitus, Vertigo, Sinus pain, snoring.  Cardio: Denies chest pain, palpitations, irregular heartbeat, syncope, dyspnea, diaphoresis, orthopnea, PND, claudication, edema Respiratory: denies cough, dyspnea, DOE, pleurisy, hoarseness, laryngitis, wheezing.  Gastrointestinal: Denies dysphagia, heartburn, reflux, water brash, pain, cramps, nausea, vomiting, bloating, diarrhea, constipation, hematemesis, melena, hematochezia, jaundice, hemorrhoids Genitourinary: Denies dysuria, frequency, urgency, nocturia, hesitancy, discharge, hematuria, flank pain Breast: Breast lumps, nipple discharge, bleeding.  Musculoskeletal: Denies arthralgia, myalgia, stiffness, Jt. Swelling, pain, limp, and strain/sprain. Denies falls. Skin: Denies puritis, rash, hives, warts, acne, eczema, changing in skin lesion Neuro: No weakness, tremor, incoordination, spasms, paresthesia, pain Psychiatric: Denies confusion, memory loss, sensory loss. Denies Depression. Endocrine: Denies change in weight, skin, hair change, nocturia, and paresthesia, diabetic polys, visual blurring, hyper / hypo glycemic episodes.  Heme/Lymph: No excessive bleeding, bruising, enlarged lymph nodes.  Physical Exam  BP 110/82 mmHg  Pulse 76  Temp(Src) 97.9 F (36.6 C)  Resp 16  Ht 5' 1.5" (1.562 m)  Wt 122 lb 12.8 oz (55.702 kg)  BMI 22.83 kg/m2  General Appearance: Well nourished and in no apparent distress. Eyes: PERRLA, EOMs, conjunctiva no swelling or erythema, normal fundi and vessels. Sinuses: No frontal/maxillary tenderness ENT/Mouth: EACs patent / TMs  nl. Nares clear without erythema, swelling, mucoid exudates. Oral hygiene is good. No erythema, swelling, or exudate. Tongue normal, non-obstructing. Tonsils not swollen or erythematous. Hearing normal.  Neck: Supple,  thyroid normal. No bruits, nodes or JVD. Respiratory: Respiratory effort normal.  BS equal and clear bilateral without rales, rhonci, wheezing or stridor. Cardio: Heart sounds are normal with regular rate and rhythm and no murmurs, rubs or gallops. Peripheral pulses are normal and equal bilaterally without edema. No aortic or femoral bruits. Chest: symmetric with normal excursions and percussion. Breasts: Symmetric, without lumps, nipple discharge, retractions, or fibrocystic changes.  Abdomen: Flat, soft, with bowel sounds. Nontender, no guarding, rebound, hernias, masses, or organomegaly.  Lymphatics: Non tender without lymphadenopathy.  Genitourinary:  Musculoskeletal: Full ROM all peripheral extremities, joint stability, 5/5 strength, and normal gait. Skin: Warm and dry without rashes, lesions, cyanosis, clubbing or  ecchymosis.  Neuro: Cranial nerves intact, reflexes equal bilaterally. Normal muscle tone, no cerebellar symptoms. Sensation sl decreased  bilaterallyn by monofilament testing to the toes bilaterally. Mild aphasia/dysnomia.  Pysch: Awake and oriented X 3, normal affect, Insight and Judgment appropriate.   Assessment and Plan  1. Essential hypertension  - EKG 12-Lead - [place], RETROPERITNL ABD,  LTD - TSH  2. Hyperlipidemia  - Lipid panel  3. Type 2 diabetes mellitus with diabetic chronic kidney disease  - Microalbumin / creatinine urine ratio - [facility] DIABETES FOOT EXAM -  LOW EXTREMITY NEUR EXAM DOCUM - Hemoglobin A1c - Insulin, random  4. Vitamin D deficiency  - Vit D  25 hydroxy (rtn osteoporosis monitoring)  5. Atrial fibrillation, unspecified   6. CVA    7. Diastolic congestive heart failure   8. Gastroesophageal reflux disease   9. Screening for rectal cancer  - POC Hemoccult Bld/Stl  10. Body mass index (BMI) of 22.0-22.9    11. Depression screen   12. At low risk for fall   13. Medication management  - Urinalysis, Routine w reflex  microscopic (not at [facility]) - CBC with Differential/Platelet - BASIC METABOLIC PANEL WITH GFR - Hepatic function panel - Magnesium   Continue prudent diet as discussed, weight control, BP monitoring, regular exercise, and medications. Discussed med's effects and SE's. Screening labs and tests as requested with regular follow-up as recommended.  Over 40 minutes of exam, counseling, chart review was performed.

## 2015-02-18 NOTE — Patient Instructions (Signed)
Recommend Adult Low dose Aspirin or   coated  Aspirin 81 mg daily   To reduce risk of Colon Cancer 20 %,   Skin Cancer 26 % ,   Melanoma 46%   and   Pancreatic cancer 60%  ++++++++++++++++++  Vitamin D goal   is between 70-100.   Please make sure that you are taking your Vitamin D as directed.   It is very important as a natural anti-inflammatory   helping hair, skin, and nails, as well as reducing stroke and heart attack risk.   It helps your bones and helps with mood.  It also decreases numerous cancer risks so please take it as directed.   Low Vit D is associated with a 200-300% higher risk for CANCER   and 200-300% higher risk for HEART   ATTACK  &  STROKE.   ......................................  It is also associated with higher death rate at younger ages,   autoimmune diseases like Rheumatoid arthritis, Lupus, Multiple Sclerosis.     Also many other serious conditions, like depression, Alzheimer's  Dementia, infertility, muscle aches, fatigue, fibromyalgia - just to name a few.  +++++++++++++++++++  Recommend the book "The END of DIETING" by Dr Joel Fuhrman   & the book "The END of DIABETES " by Dr Joel Fuhrman  At [employer] - get book & Audio CD's     Being diabetic has a  300% increased risk for heart attack, stroke, cancer, and alzheimer- type vascular dementia. It is very important that you work harder with diet by avoiding all foods that are white. Avoid white rice (brown & wild rice is OK), white potatoes (sweetpotatoes in moderation is OK), White bread or wheat bread or anything made out of white flour like bagels, donuts, rolls, buns, biscuits, cakes, pastries, cookies, pizza crust, and pasta (made from white flour & egg whites) - vegetarian pasta or spinach or wheat pasta is OK. Multigrain breads like Arnold's or [employer], or multigrain sandwich thins or flatbreads.  Diet, exercise and weight loss can reverse and cure diabetes in the early  stages.  Diet, exercise and weight loss is very important in the control and prevention of complications of diabetes which affects every system in your body, ie. Brain - dementia/stroke, eyes - glaucoma/blindness, heart - heart attack/heart failure, kidneys - dialysis, stomach - gastric paralysis, intestines - malabsorption, nerves - severe painful neuritis, circulation - gangrene & loss of a leg(s), and finally cancer and Alzheimers.    I recommend avoid fried & greasy foods,  sweets/candy, white rice (brown or wild rice or Quinoa is OK), white potatoes (sweet potatoes are OK) - anything made from white flour - bagels, doughnuts, rolls, buns, biscuits,white and wheat breads, pizza crust and traditional pasta made of white flour & egg white(vegetarian pasta or spinach or wheat pasta is OK).  Multi-grain bread is OK - like multi-grain flat bread or sandwich thins. Avoid alcohol in excess. Exercise is also important.    Eat all the vegetables you want - avoid meat, especially red meat and dairy - especially cheese.  Cheese is the most concentrated form of trans-fats which is the worst thing to clog up our arteries. Veggie cheese is OK which can be found in the fresh produce section at [employer] or [employer] or [employer]  ++++++++++++++++++++++++++   Preventive Care for Adults  A healthy lifestyle and preventive care can promote health and wellness. Preventive health guidelines for women include the following key practices.  A routine   to check with your health care Lisa Hopkins about your health and preventive screening. It is a chance to share any concerns and updates on your health and to receive a thorough exam.  Visit your dentist for a routine exam and preventive care every 6 months. Brush your teeth twice a day and floss once a day. Good oral hygiene prevents tooth decay and gum disease.  The frequency of eye exams is based on your age, health, family medical history, use  of contact lenses, and other factors. Follow your health care Lisa Hopkins's recommendations for frequency of eye exams.  Eat a healthy diet. Foods like vegetables, fruits, whole grains, low-fat dairy products, and lean protein foods contain the nutrients you need without too many calories. Decrease your intake of foods high in solid fats, added sugars, and salt. Eat the right amount of calories for you.Get information about a proper diet from your health care Lisa Hopkins, if necessary.  Regular physical exercise is one of the most important things you can do for your health. Most adults should get at least 150 minutes of moderate-intensity exercise (any activity that increases your heart rate and causes you to sweat) each week. In addition, most adults need muscle-strengthening exercises on 2 or more days a week.  Maintain a healthy weight. The body mass index (BMI) is a screening tool to identify possible weight problems. It provides an estimate of body fat based on height and weight. Your health care Lisa Hopkins can find your BMI and can help you achieve or maintain a healthy weight.For adults 20 years and older:  A BMI below 18.5 is considered underweight.  A BMI of 18.5 to 24.9 is normal.  A BMI of 25 to 29.9 is considered overweight.  A BMI of 30 and above is considered obese.  Maintain normal blood lipids and cholesterol levels by exercising and minimizing your intake of saturated fat. Eat a balanced diet with plenty of fruit and vegetables. If your lipid or cholesterol levels are high, you are over 50, or you are at high risk for heart disease, you may need your cholesterol levels checked more frequently.Ongoing high lipid and cholesterol levels should be treated with medicines if diet and exercise are not working.  If you smoke, find out from your health care Lisa Hopkins how to quit. If you do not use tobacco, do not start.  Lung cancer screening is recommended for adults aged 55-80 years who are  at high risk for developing lung cancer because of a history of smoking. A yearly low-dose CT scan of the lungs is recommended for people who have at least a 30-pack-year history of smoking and are a current smoker or have quit within the past 15 years. A pack year of smoking is smoking an average of 1 pack of cigarettes a day for 1 year (for example: 1 pack a day for 30 years or 2 packs a day for 15 years). Yearly screening should continue until the smoker has stopped smoking for at least 15 years. Yearly screening should be stopped for people who develop a health problem that would prevent them from having lung cancer treatment.  Avoid use of street drugs. Do not share needles with anyone. Ask for help if you need support or instructions about stopping the use of drugs.  High blood pressure causes heart disease and increases the risk of stroke.  Ongoing high blood pressure should be treated with medicines if weight loss and exercise do not work.  If you are 55-79   years old, ask your health care Torria Fromer if you should take aspirin to prevent strokes.  Diabetes screening involves taking a blood sample to check your fasting blood sugar level. This should be done once every 3 years, after age 45, if you are within normal weight and without risk factors for diabetes. Testing should be considered at a younger age or be carried out more frequently if you are overweight and have at least 1 risk factor for diabetes.  Breast cancer screening is essential preventive care for women. You should practice "breast self-awareness." This means understanding the normal appearance and feel of your breasts and may include breast self-examination. Any changes detected, no matter how small, should be reported to a health care Jago Carton. Women in their 20s and 30s should have a clinical breast exam (CBE) by a health care Sheza Strickland as part of a regular health exam every 1 to 3 years. After age 40, women should have a CBE every  year. Starting at age 40, women should consider having a mammogram (breast X-ray test) every year. Women who have a family history of breast cancer should talk to their health care Ran Tullis about genetic screening. Women at a high risk of breast cancer should talk to their health care providers about having an MRI and a mammogram every year.  Breast cancer gene (BRCA)-related cancer risk assessment is recommended for women who have family members with BRCA-related cancers. BRCA-related cancers include breast, ovarian, tubal, and peritoneal cancers. Having family members with these cancers may be associated with an increased risk for harmful changes (mutations) in the breast cancer genes BRCA1 and BRCA2. Results of the assessment will determine the need for genetic counseling and BRCA1 and BRCA2 testing.  Routine pelvic exams to screen for cancer are no longer recommended for nonpregnant women who are considered low risk for cancer of the pelvic organs (ovaries, uterus, and vagina) and who do not have symptoms. Ask your health care Jensine Luz if a screening pelvic exam is right for you.  If you have had past treatment for cervical cancer or a condition that could lead to cancer, you need Pap tests and screening for cancer for at least 20 years after your treatment. If Pap tests have been discontinued, your risk factors (such as having a new sexual partner) need to be reassessed to determine if screening should be resumed. Some women have medical problems that increase the chance of getting cervical cancer. In these cases, your health care Camilla Skeen may recommend more frequent screening and Pap tests.    Colorectal cancer can be detected and often prevented. Most routine colorectal cancer screening begins at the age of 50 years and continues through age 75 years. However, your health care Elda Dunkerson may recommend screening at an earlier age if you have risk factors for colon cancer. On a yearly basis, your health  care Anni Hocevar may provide home test kits to check for hidden blood in the stool. Use of a small camera at the end of a tube, to directly examine the colon (sigmoidoscopy or colonoscopy), can detect the earliest forms of colorectal cancer. Talk to your health care Okley Magnussen about this at age 50, when routine screening begins. Direct exam of the colon should be repeated every 5-10 years through age 75 years, unless early forms of pre-cancerous polyps or small growths are found.  Osteoporosis is a disease in which the bones lose minerals and strength with aging. This can result in serious bone fractures or breaks. The risk of osteoporosis   can be identified using a bone density scan. Women ages 65 years and over and women at risk for fractures or osteoporosis should discuss screening with their health care providers. Ask your health care Veeda Virgo whether you should take a calcium supplement or vitamin D to reduce the rate of osteoporosis.  Menopause can be associated with physical symptoms and risks. Hormone replacement therapy is available to decrease symptoms and risks. You should talk to your health care Jarmel Linhardt about whether hormone replacement therapy is right for you.  Use sunscreen. Apply sunscreen liberally and repeatedly throughout the day. You should seek shade when your shadow is shorter than you. Protect yourself by wearing long sleeves, pants, a wide-brimmed hat, and sunglasses year round, whenever you are outdoors.  Once a month, do a whole body skin exam, using a mirror to look at the skin on your back. Tell your health care Laticia Vannostrand of new moles, moles that have irregular borders, moles that are larger than a pencil eraser, or moles that have changed in shape or color.  Stay current with required vaccines (immunizations).  Influenza vaccine. All adults should be immunized every year.  Tetanus, diphtheria, and acellular pertussis (Td, Tdap) vaccine. Pregnant women should receive 1 dose of  Tdap vaccine during each pregnancy. The dose should be obtained regardless of the length of time since the last dose. Immunization is preferred during the 27th-36th week of gestation. An adult who has not previously received Tdap or who does not know her vaccine status should receive 1 dose of Tdap. This initial dose should be followed by tetanus and diphtheria toxoids (Td) booster doses every 10 years. Adults with an unknown or incomplete history of completing a 3-dose immunization series with Td-containing vaccines should begin or complete a primary immunization series including a Tdap dose. Adults should receive a Td booster every 10 years.    Zoster vaccine. One dose is recommended for adults aged 60 years or older unless certain conditions are present.    Pneumococcal 13-valent conjugate (PCV13) vaccine. When indicated, a person who is uncertain of her immunization history and has no record of immunization should receive the PCV13 vaccine. An adult aged 19 years or older who has certain medical conditions and has not been previously immunized should receive 1 dose of PCV13 vaccine. This PCV13 should be followed with a dose of pneumococcal polysaccharide (PPSV23) vaccine. The PPSV23 vaccine dose should be obtained at least 8 weeks after the dose of PCV13 vaccine. An adult aged 19 years or older who has certain medical conditions and previously received 1 or more doses of PPSV23 vaccine should receive 1 dose of PCV13. The PCV13 vaccine dose should be obtained 1 or more years after the last PPSV23 vaccine dose.    Pneumococcal polysaccharide (PPSV23) vaccine. When PCV13 is also indicated, PCV13 should be obtained first. All adults aged 65 years and older should be immunized. An adult younger than age 65 years who has certain medical conditions should be immunized. Any person who resides in a nursing home or long-term care facility should be immunized. An adult smoker should be immunized. People with an  immunocompromised condition and certain other conditions should receive both PCV13 and PPSV23 vaccines. People with human immunodeficiency virus (HIV) infection should be immunized as soon as possible after diagnosis. Immunization during chemotherapy or radiation therapy should be avoided. Routine use of PPSV23 vaccine is not recommended for American Indians, [place] Natives, or people younger than 65 years unless there are medical conditions that require   PPSV23 vaccine. When indicated, people who have unknown immunization and have no record of immunization should receive PPSV23 vaccine. One-time revaccination 5 years after the first dose of PPSV23 is recommended for people aged 19-64 years who have chronic kidney failure, nephrotic syndrome, asplenia, or immunocompromised conditions. People who received 1-2 doses of PPSV23 before age 65 years should receive another dose of PPSV23 vaccine at age 65 years or later if at least 5 years have passed since the previous dose. Doses of PPSV23 are not needed for people immunized with PPSV23 at or after age 65 years.   Preventive Services / Frequency  Ages 65 years and over  Blood pressure check.  Lipid and cholesterol check.  Lung cancer screening. / Every year if you are aged 55-80 years and have a 30-pack-year history of smoking and currently smoke or have quit within the past 15 years. Yearly screening is stopped once you have quit smoking for at least 15 years or develop a health problem that would prevent you from having lung cancer treatment.  Clinical breast exam.** / Every year after age 40 years.  BRCA-related cancer risk assessment.** / For women who have family members with a BRCA-related cancer (breast, ovarian, tubal, or peritoneal cancers).  Mammogram.** / Every year beginning at age 40 years and continuing for as long as you are in good health. Consult with your health care Mashayla Lavin.  Pap test.** / Every 3 years starting at age 30 years  through age 65 or 70 years with 3 consecutive normal Pap tests. Testing can be stopped between 65 and 70 years with 3 consecutive normal Pap tests and no abnormal Pap or HPV tests in the past 10 years.  Fecal occult blood test (FOBT) of stool. / Every year beginning at age 50 years and continuing until age 75 years. You may not need to do this test if you get a colonoscopy every 10 years.  Flexible sigmoidoscopy or colonoscopy.** / Every 5 years for a flexible sigmoidoscopy or every 10 years for a colonoscopy beginning at age 50 years and continuing until age 75 years.  Hepatitis C blood test.** / For all people born from 1945 through 1965 and any individual with known risks for hepatitis C.  Osteoporosis screening.** / A one-time screening for women ages 65 years and over and women at risk for fractures or osteoporosis.  Skin self-exam. / Monthly.  Influenza vaccine. / Every year.  Tetanus, diphtheria, and acellular pertussis (Tdap/Td) vaccine.** / 1 dose of Td every 10 years.  Zoster vaccine.** / 1 dose for adults aged 60 years or older.  Pneumococcal 13-valent conjugate (PCV13) vaccine.** / Consult your health care Stavros Cail.  Pneumococcal polysaccharide (PPSV23) vaccine.** / 1 dose for all adults aged 65 years and older. Screening for abdominal aortic aneurysm (AAA)  by ultrasound is recommended for people who have history of high blood pressure or who are current or former smokers. 

## 2015-02-19 LAB — URINALYSIS, MICROSCOPIC ONLY
Bacteria, UA: NONE SEEN [HPF]
CRYSTALS: NONE SEEN [HPF]
Yeast: NONE SEEN [HPF]

## 2015-02-19 LAB — TSH: TSH: 1.218 u[IU]/mL (ref 0.350–4.500)

## 2015-02-19 LAB — URINALYSIS, ROUTINE W REFLEX MICROSCOPIC
Bilirubin Urine: NEGATIVE
Glucose, UA: NEGATIVE
Ketones, ur: NEGATIVE
Leukocytes, UA: NEGATIVE
Nitrite: NEGATIVE
Protein, ur: NEGATIVE
Specific Gravity, Urine: 1.014 (ref 1.001–1.035)
pH: 5 (ref 5.0–8.0)

## 2015-02-19 LAB — INSULIN, RANDOM: INSULIN: 38.6 u[IU]/mL — AB (ref 2.0–19.6)

## 2015-02-19 LAB — VITAMIN D 25 HYDROXY (VIT D DEFICIENCY, FRACTURES): Vit D, 25-Hydroxy: 56 ng/mL (ref 30–100)

## 2015-02-19 LAB — MICROALBUMIN / CREATININE URINE RATIO
Creatinine, Urine: 80.5 mg/dL
MICROALB/CREAT RATIO: 33.5 mg/g — AB (ref 0.0–30.0)
Microalb, Ur: 2.7 mg/dL — ABNORMAL HIGH (ref <2.0)

## 2015-02-19 LAB — HEMOGLOBIN A1C
Hgb A1c MFr Bld: 6.7 % — ABNORMAL HIGH
Mean Plasma Glucose: 146 mg/dL — ABNORMAL HIGH

## 2015-02-23 ENCOUNTER — Ambulatory Visit (INDEPENDENT_AMBULATORY_CARE_PROVIDER_SITE_OTHER): Payer: Medicare Other

## 2015-02-23 DIAGNOSIS — I4891 Unspecified atrial fibrillation: Secondary | ICD-10-CM | POA: Diagnosis not present

## 2015-02-23 DIAGNOSIS — I639 Cerebral infarction, unspecified: Secondary | ICD-10-CM | POA: Diagnosis not present

## 2015-02-23 DIAGNOSIS — I6350 Cerebral infarction due to unspecified occlusion or stenosis of unspecified cerebral artery: Secondary | ICD-10-CM

## 2015-02-23 DIAGNOSIS — Z79899 Other long term (current) drug therapy: Secondary | ICD-10-CM

## 2015-02-23 LAB — POCT INR: INR: 4.3

## 2015-03-05 ENCOUNTER — Ambulatory Visit (INDEPENDENT_AMBULATORY_CARE_PROVIDER_SITE_OTHER): Payer: Medicare Other | Admitting: *Deleted

## 2015-03-05 DIAGNOSIS — Z79899 Other long term (current) drug therapy: Secondary | ICD-10-CM | POA: Diagnosis not present

## 2015-03-05 DIAGNOSIS — I6350 Cerebral infarction due to unspecified occlusion or stenosis of unspecified cerebral artery: Secondary | ICD-10-CM | POA: Diagnosis not present

## 2015-03-05 DIAGNOSIS — I63529 Cerebral infarction due to unspecified occlusion or stenosis of unspecified anterior cerebral artery: Secondary | ICD-10-CM | POA: Diagnosis not present

## 2015-03-05 DIAGNOSIS — I4891 Unspecified atrial fibrillation: Secondary | ICD-10-CM | POA: Diagnosis not present

## 2015-03-05 LAB — POCT INR: INR: 3.3

## 2015-03-08 ENCOUNTER — Other Ambulatory Visit: Payer: Self-pay | Admitting: Internal Medicine

## 2015-03-18 ENCOUNTER — Other Ambulatory Visit: Payer: Self-pay

## 2015-03-18 DIAGNOSIS — Z1231 Encounter for screening mammogram for malignant neoplasm of breast: Secondary | ICD-10-CM

## 2015-03-23 ENCOUNTER — Ambulatory Visit (INDEPENDENT_AMBULATORY_CARE_PROVIDER_SITE_OTHER): Payer: Medicare Other | Admitting: *Deleted

## 2015-03-23 DIAGNOSIS — Z79899 Other long term (current) drug therapy: Secondary | ICD-10-CM

## 2015-03-23 DIAGNOSIS — I4891 Unspecified atrial fibrillation: Secondary | ICD-10-CM

## 2015-03-23 DIAGNOSIS — I63529 Cerebral infarction due to unspecified occlusion or stenosis of unspecified anterior cerebral artery: Secondary | ICD-10-CM

## 2015-03-23 DIAGNOSIS — I6350 Cerebral infarction due to unspecified occlusion or stenosis of unspecified cerebral artery: Secondary | ICD-10-CM

## 2015-03-23 LAB — POCT INR: INR: 2

## 2015-03-30 ENCOUNTER — Other Ambulatory Visit: Payer: Medicare Other

## 2015-03-30 ENCOUNTER — Ambulatory Visit: Payer: Medicare Other | Admitting: Cardiology

## 2015-04-02 ENCOUNTER — Emergency Department (HOSPITAL_COMMUNITY)
Admission: EM | Admit: 2015-04-02 | Discharge: 2015-04-02 | Disposition: A | Discharge status: Home / Self Care | Payer: Medicare Other | Attending: Emergency Medicine | Admitting: Emergency Medicine

## 2015-04-02 ENCOUNTER — Emergency Department (HOSPITAL_COMMUNITY): Payer: Medicare Other

## 2015-04-02 ENCOUNTER — Encounter (HOSPITAL_COMMUNITY): Payer: Self-pay | Admitting: Emergency Medicine

## 2015-04-02 DIAGNOSIS — E785 Hyperlipidemia, unspecified: Secondary | ICD-10-CM | POA: Insufficient documentation

## 2015-04-02 DIAGNOSIS — Z8673 Personal history of transient ischemic attack (TIA), and cerebral infarction without residual deficits: Secondary | ICD-10-CM | POA: Diagnosis not present

## 2015-04-02 DIAGNOSIS — Z8659 Personal history of other mental and behavioral disorders: Secondary | ICD-10-CM | POA: Diagnosis not present

## 2015-04-02 DIAGNOSIS — E119 Type 2 diabetes mellitus without complications: Secondary | ICD-10-CM | POA: Insufficient documentation

## 2015-04-02 DIAGNOSIS — Z79899 Other long term (current) drug therapy: Secondary | ICD-10-CM | POA: Diagnosis not present

## 2015-04-02 DIAGNOSIS — K449 Diaphragmatic hernia without obstruction or gangrene: Secondary | ICD-10-CM | POA: Diagnosis not present

## 2015-04-02 DIAGNOSIS — I10 Essential (primary) hypertension: Secondary | ICD-10-CM | POA: Diagnosis not present

## 2015-04-02 DIAGNOSIS — R109 Unspecified abdominal pain: Secondary | ICD-10-CM

## 2015-04-02 DIAGNOSIS — R1013 Epigastric pain: Secondary | ICD-10-CM | POA: Insufficient documentation

## 2015-04-02 DIAGNOSIS — Z7901 Long term (current) use of anticoagulants: Secondary | ICD-10-CM | POA: Insufficient documentation

## 2015-04-02 DIAGNOSIS — K219 Gastro-esophageal reflux disease without esophagitis: Secondary | ICD-10-CM | POA: Diagnosis not present

## 2015-04-02 LAB — URINALYSIS, ROUTINE W REFLEX MICROSCOPIC
GLUCOSE, UA: NEGATIVE mg/dL
Ketones, ur: NEGATIVE mg/dL
Leukocytes, UA: NEGATIVE
Nitrite: NEGATIVE
Protein, ur: 30 mg/dL — AB
SPECIFIC GRAVITY, URINE: 1.03 (ref 1.005–1.030)
UROBILINOGEN UA: 1 mg/dL (ref 0.0–1.0)
pH: 6 (ref 5.0–8.0)

## 2015-04-02 LAB — URINE MICROSCOPIC-ADD ON

## 2015-04-02 LAB — I-STAT TROPONIN, ED: TROPONIN I, POC: 0 ng/mL (ref 0.00–0.08)

## 2015-04-02 LAB — COMPREHENSIVE METABOLIC PANEL
ALT: 18 U/L (ref 14–54)
ANION GAP: 9 (ref 5–15)
AST: 24 U/L (ref 15–41)
Albumin: 3.7 g/dL (ref 3.5–5.0)
Alkaline Phosphatase: 113 U/L (ref 38–126)
BILIRUBIN TOTAL: 1.2 mg/dL (ref 0.3–1.2)
BUN: 18 mg/dL (ref 6–20)
CALCIUM: 9.6 mg/dL (ref 8.9–10.3)
CO2: 29 mmol/L (ref 22–32)
Chloride: 103 mmol/L (ref 101–111)
Creatinine, Ser: 0.96 mg/dL (ref 0.44–1.00)
GFR calc Af Amer: 59 mL/min — ABNORMAL LOW (ref >60)
GFR, EST NON AFRICAN AMERICAN: 51 mL/min — AB (ref >60)
Glucose, Bld: 211 mg/dL — ABNORMAL HIGH (ref 65–99)
POTASSIUM: 4.3 mmol/L (ref 3.5–5.1)
Sodium: 141 mmol/L (ref 135–145)
TOTAL PROTEIN: 7.1 g/dL (ref 6.5–8.1)

## 2015-04-02 LAB — CBC
HEMATOCRIT: 44.9 % (ref 36.0–46.0)
Hemoglobin: 14.8 g/dL (ref 12.0–15.0)
MCH: 31.8 pg (ref 26.0–34.0)
MCHC: 33 g/dL (ref 30.0–36.0)
MCV: 96.4 fL (ref 78.0–100.0)
Platelets: 199 10*3/uL (ref 150–400)
RBC: 4.66 MIL/uL (ref 3.87–5.11)
RDW: 13.5 % (ref 11.5–15.5)
WBC: 9.5 10*3/uL (ref 4.0–10.5)

## 2015-04-02 LAB — LIPASE, BLOOD: Lipase: 41 U/L (ref 11–51)

## 2015-04-02 NOTE — ED Provider Notes (Signed)
CSN: [id number]     Arrival date & time 04/02/15  1007 History   First MD Initiated Contact with Patient 04/02/15 1223     Chief Complaint  Patient presents with  . Abdominal Pain     (Consider location/radiation/quality/duration/timing/severity/associated sxs/prior Treatment) Patient is a 81 y.o. female presenting with abdominal pain. The history is provided by the patient (The patient states that she had her wisdom teeth taken out yesterday. Today she complained of some epigastric pain after eating soup. Patient does not have any pain now).  Abdominal Pain Pain location:  Epigastric Pain quality: aching   Pain radiates to:  Does not radiate Pain severity:  Moderate Onset quality:  Sudden Timing:  Constant Chronicity:  New Context: not alcohol use   Associated symptoms: no chest pain, no cough, no diarrhea, no fatigue and no hematuria     Past Medical History  Diagnosis Date  . Atrial fibrillation (HCC)   . Aphasia as late effect of cerebrovascular accident   . CVA (cerebral infarction)   . Hyperlipidemia   . HTN (hypertension)   . Anxiety   . Diabetes mellitus type II   . Hiatal hernia   . GERD (gastroesophageal reflux disease)   . IBS (irritable bowel syndrome)   . Vitamin D deficiency   . Stroke [facility](HCC)    Past Surgical History  Procedure Laterality Date  . Upper gastrointestinal endoscopy  2005  . Colonoscopy  2005  . Cholecystectomy    . Cataract extraction, bilateral    . Tonsillectomy     Family History  Problem Relation Age of Onset  . Diabetes Mother   . Stroke Mother   . Hypertension Brother   . Heart disease Brother   . Arthritis Sister     Rhematoid  . Arthritis Sister     Rhematoid   Social History  Substance Use Topics  . Smoking status: Never Smoker   . Smokeless tobacco: Never Used  . Alcohol Use: No   OB History    No data available     Review of Systems  Constitutional: Negative for appetite change and fatigue.  HENT: Negative for  congestion, ear discharge and sinus pressure.   Eyes: Negative for discharge.  Respiratory: Negative for cough.   Cardiovascular: Negative for chest pain.  Gastrointestinal: Positive for abdominal pain. Negative for diarrhea.  Genitourinary: Negative for frequency and hematuria.  Musculoskeletal: Negative for back pain.  Skin: Negative for rash.  Neurological: Negative for seizures and headaches.  Psychiatric/Behavioral: Negative for hallucinations.      Allergies  Ace inhibitors; Fosamax; Norvasc; and Zocor  Home Medications   Prior to Admission medications   Medication Sig Start Date End Date Taking? Authorizing Provider  acetaminophen (TYLENOL) 325 MG tablet Take 325-650 mg by mouth every 4 (four) hours as needed for mild pain, moderate pain or headache.    Yes Historical Provider, MD  Cholecalciferol (VITAMIN D) 2000 UNITS CAPS Take 2,000 Units by mouth daily.    Yes Historical Provider, MD  hydrochlorothiazide (HYDRODIURIL) 25 MG tablet Take 25 mg by mouth daily.   Yes Historical Provider, MD  Magnesium Hydroxide (MAGNESIA PO) Take 1 tablet by mouth daily.    Yes Historical Provider, MD  metoprolol (LOPRESSOR) 50 MG tablet TAKE ONE TABLET BY MOUTH TWICE DAILY 03/08/15  Yes Quentin MullingAmanda Collier, PA-C  pantoprazole (PROTONIX) 40 MG tablet TAKE ONE TABLET BY MOUTH ONCE DAILY FOR ACID INDIGESTION. 02/03/15  Yes [name], MD  ranitidine (ZANTAC) 300 MG tablet  TAKE ONE TABLET BY MOUTH TWICE DAILY 02/06/15  Yes Lucky Cowboy, MD  rosuvastatin (CRESTOR) 20 MG tablet Take 1 tablet (20 mg total) by mouth every evening. 12/01/14  Yes Lucky Cowboy, MD  warfarin (COUMADIN) 2 MG tablet TAKE AS DIRECTED BY ANTICOAGULATION CLINIC Patient taking differently: TAKE  BY MOUTH DAILY EXCEPT TAKE  ON TUES,THURS,SAT 12/29/14  Yes Lars Masson, MD  furosemide (LASIX) 20 MG tablet Take 1 tablet (20 mg total) by mouth daily. Patient not taking: Reported on 04/02/2015 11/26/14   Lars Masson,  MD  pantoprazole (PROTONIX) 40 MG tablet TAKE ONE TABLET BY MOUTH ONCE DAILY FOR ACID INDIGESTION Patient not taking: Reported on 04/02/2015 01/25/15   Toni Amend Forcucci, PA-C  potassium chloride (K-DUR) 10 MEQ tablet Take 1 tablet (10 mEq total) by mouth daily. Patient not taking: Reported on 04/02/2015 11/26/14   Lars Masson, MD  predniSONE (DELTASONE) 20 MG tablet 1 tab 3 x day for 3 days, then 1 tab 2 x day for 3 days, then 1 tab 1 x day for 5 days Patient not taking: Reported on 04/02/2015 02/18/15   Lucky Cowboy, MD   BP 129/55 mmHg  Pulse 67  Temp(Src) 97.7 F (36.5 C) (Oral)  Resp 16  SpO2 94% Physical Exam  Constitutional: She is oriented to person, place, and time. She appears well-developed.  HENT:  Head: Normocephalic.  Eyes: Conjunctivae and EOM are normal. No scleral icterus.  Neck: Neck supple. No thyromegaly present.  Cardiovascular: Normal rate and regular rhythm.  Exam reveals no gallop and no friction rub.   No murmur heard. Pulmonary/Chest: No stridor. She has no wheezes. She has no rales. She exhibits no tenderness.  Abdominal: She exhibits no distension. There is no tenderness. There is no rebound.  Musculoskeletal: Normal range of motion. She exhibits no edema.  Lymphadenopathy:    She has no cervical adenopathy.  Neurological: She is oriented to person, place, and time. She exhibits normal muscle tone. Coordination normal.  Skin: No rash noted. No erythema.  Psychiatric: She has a normal mood and affect. Her behavior is normal.    ED Course  Procedures (including critical care time) Labs Review Labs Reviewed  COMPREHENSIVE METABOLIC PANEL - Abnormal; Notable for the following:    Glucose, Bld 211 (*)    GFR calc non Af Amer 51 (*)    GFR calc Af Amer 59 (*)    All other components within normal limits  URINALYSIS, ROUTINE W REFLEX MICROSCOPIC (NOT AT [facility]) - Abnormal; Notable for the following:    Color, Urine AMBER (*)    APPearance CLOUDY (*)     Hgb urine dipstick MODERATE (*)    Bilirubin Urine SMALL (*)    Protein, ur 30 (*)    All other components within normal limits  URINE MICROSCOPIC-ADD ON - Abnormal; Notable for the following:    Squamous Epithelial / LPF MANY (*)    All other components within normal limits  LIPASE, BLOOD  CBC  I-STAT TROPOININ, ED    Imaging Review Dg Abd Acute W/chest  04/02/2015  CLINICAL DATA:  Upper chest pain.  Large hiatal hernia. EXAM: DG ABDOMEN ACUTE W/ 1V CHEST COMPARISON:  03/01/2010, 06/08/2014 FINDINGS: Very large hiatal hernia again noted with an air-fluid level. Left hemidiaphragm remains elevated. Stable cardiomegaly. No superimposed pneumonia, edema or CHF. No large effusion or pneumothorax. Prior cholecystectomy evident. Normal bowel gas pattern without obstruction or ileus. No free air. Benign pelvic calcifications. No acute osseous  finding. IMPRESSION: Stable cardiomegaly without CHF or pneumonia Large hiatal hernia and left hemidiaphragm elevation Negative for free air or obstruction Electronically Signed   By: Judie Petit.  Shick M.D.   On: 04/02/2015 13:48   I have personally reviewed and evaluated these images and lab results as part of my medical decision-making.   EKG Interpretation None      MDM   Final diagnoses:  Abdominal pain in female  Hiatal hernia      EKG labs unremarkable. Abdominal series shows large hiatal hernia. Patient is already on protonic and Zantac. Doubt coronary artery disease patient will be discharged for follow-up with PCP  Bethann Berkshire, MD 04/02/15 [phone]

## 2015-04-02 NOTE — Discharge Instructions (Signed)
Follow up with your md if any problems °

## 2015-04-02 NOTE — ED Notes (Signed)
Bed: WA05 Expected date:  Expected time:  Means of arrival:  Comments: Hold for triage 

## 2015-04-02 NOTE — ED Notes (Signed)
Per pt, states she ate a bowl of potatoe soup for dinner-starting having epigastric pain/indegestion-no relief with tums-states she took percocet and it relieved pain a little

## 2015-04-07 ENCOUNTER — Other Ambulatory Visit: Payer: Self-pay | Admitting: Physician Assistant

## 2015-04-13 ENCOUNTER — Ambulatory Visit (INDEPENDENT_AMBULATORY_CARE_PROVIDER_SITE_OTHER): Payer: Medicare Other | Admitting: Pharmacist

## 2015-04-13 ENCOUNTER — Ambulatory Visit
Admission: RE | Admit: 2015-04-13 | Discharge: 2015-04-13 | Disposition: A | Discharge status: Home / Self Care | Payer: Medicare Other | Source: Ambulatory Visit

## 2015-04-13 DIAGNOSIS — I6350 Cerebral infarction due to unspecified occlusion or stenosis of unspecified cerebral artery: Secondary | ICD-10-CM | POA: Diagnosis not present

## 2015-04-13 DIAGNOSIS — Z1231 Encounter for screening mammogram for malignant neoplasm of breast: Secondary | ICD-10-CM

## 2015-04-13 DIAGNOSIS — I4891 Unspecified atrial fibrillation: Secondary | ICD-10-CM | POA: Diagnosis not present

## 2015-04-13 DIAGNOSIS — Z79899 Other long term (current) drug therapy: Secondary | ICD-10-CM

## 2015-04-13 DIAGNOSIS — I63529 Cerebral infarction due to unspecified occlusion or stenosis of unspecified anterior cerebral artery: Secondary | ICD-10-CM

## 2015-04-13 LAB — POCT INR: INR: 4

## 2015-04-20 ENCOUNTER — Other Ambulatory Visit: Payer: Medicare Other

## 2015-04-20 ENCOUNTER — Ambulatory Visit (INDEPENDENT_AMBULATORY_CARE_PROVIDER_SITE_OTHER): Payer: Medicare Other | Admitting: Cardiology

## 2015-04-20 DIAGNOSIS — I5033 Acute on chronic diastolic (congestive) heart failure: Secondary | ICD-10-CM | POA: Diagnosis not present

## 2015-04-20 DIAGNOSIS — I481 Persistent atrial fibrillation: Secondary | ICD-10-CM | POA: Diagnosis not present

## 2015-04-20 DIAGNOSIS — I4819 Other persistent atrial fibrillation: Secondary | ICD-10-CM

## 2015-04-20 DIAGNOSIS — I10 Essential (primary) hypertension: Secondary | ICD-10-CM | POA: Diagnosis not present

## 2015-04-20 DIAGNOSIS — I5032 Chronic diastolic (congestive) heart failure: Secondary | ICD-10-CM

## 2015-04-20 MED ORDER — METOPROLOL TARTRATE 25 MG PO TABS: 25.0000 mg | ORAL_TABLET | Freq: Two times a day (BID) | ORAL | Status: DC

## 2015-04-20 NOTE — Patient Instructions (Addendum)
Your physician has recommended you make the following change in your medication:    DECREASE YOUR METOPROLOL TO 25 MG TWICE DAILY    Your physician wants you to follow-up in: ONE YEAR WITH DR Johnell ComingsNELSON You will receive a reminder letter in the mail two months in advance. If you don't receive a letter, please call our office to schedule the follow-up appointment.

## 2015-04-20 NOTE — Progress Notes (Signed)
Patient ID: Lisa Hopkins, female   DOB: [birth year], 81 y.o.   MRN: [id number]    Patient Name: Lisa Hopkins Date of Encounter: 04/20/2015  Primary Care Provider:  Nadean Corwin, MD Primary Cardiologist:  Lars Masson  Problem List   Past Medical History  Diagnosis Date  . Atrial fibrillation (HCC)   . Aphasia as late effect of cerebrovascular accident   . CVA (cerebral infarction)   . Hyperlipidemia   . HTN (hypertension)   . Anxiety   . Diabetes mellitus type II   . Hiatal hernia   . GERD (gastroesophageal reflux disease)   . IBS (irritable bowel syndrome)   . Vitamin D deficiency   . Stroke [facility])    Past Surgical History  Procedure Laterality Date  . Upper gastrointestinal endoscopy  2005  . Colonoscopy  2005  . Cholecystectomy    . Cataract extraction, bilateral    . Tonsillectomy      Allergies  Allergies  Allergen Reactions  . Ace Inhibitors     Cough  . Fosamax [Alendronate Sodium]     Heart burn  . Norvasc [Amlodipine Besylate]     edema  . Zocor [Simvastatin]     Elevated CPK   CC: LE edema  HPI  Lisa Hopkins is a pleasant 81 year old female with h/o chronic A. Fib and anticoagulation post CVA in 2010. She has been stable since then, no recurrent stroke, no DOE, CP, LE edema or DOE or palpitations.  She very compliant with her medications. She is complaining of lower abdominal pain that progresses to epigastric pain and retrosternal burning. She used to experience it daily after dinner, but recently it has become less frequent. She denies change in apettite, diarhea, bloody or black tarry stools.  She has h/o GERD and acute ischemic colitis 2 years ago, it seems that she didn't follow with a gastroenterologist since then.  06/24/2014 - the patient had an episode of colitis 1 week ago, for which she received metronidazole and ciprofloxacine. She has noticed LE edema since she started it. IT was discontinued. She has noticed  worsening SOB, no chest pain, no palpitations. Two falls in the last year, both mechanical. Complaint with her meds.   07/24/2014 - the patient is coming after 3 weeks, LE edema has improved, she is experiencing difficulty swallowing large KCl pills and having diarrhea. Otherwise denies DOE, orthopnea, or PND.   11/26/2014 - the patient is coming after 4 months, her complain is LE edema that developed 1 month ago and stays all the time. No orthopnea, PND, some palpitations, no dizziness. No chest pain.no bleeding complications with warfarin, no falls.   04/20/2015 - the patient is coming after 4 months, she feels well, no chest pain, but stable DOE, she used to walk, now only occasionally as she gets tired and SOB easily. Her husband is turning 79 years old the next week. She denies palpitations or falls, LE edema has improved with lasix 20 mg po daily, repeat Crea and K normal. No bleeding with Warfarin.   Home Medications  Prior to Admission medications   Medication Sig Start Date End Date Taking? Authorizing Provider  acetaminophen (TYLENOL) 325 MG tablet Take 650 mg by mouth every 4 (four) hours as needed.     Yes Historical Provider, MD  alum hydroxide-mag trisilicate (ANTACID) 80-20 MG CHEW Chew 1 tablet by mouth 3 (three) times daily as needed.   Yes Historical Provider, MD  Cholecalciferol (VITAMIN D)  2000 UNITS CAPS Take 2 capsules by mouth daily.    Yes Historical Provider, MD  hydrochlorothiazide (HYDRODIURIL) 25 MG tablet TAKE ONE TABLET BY MOUTH IN THE MORNING FOR BLOOD PRESSURE AND FLUID 05/30/13  Yes [name], MD  HYDROcodone-acetaminophen (NORCO/VICODIN) 5-325 MG per tablet Take 1-2 tablets by mouth every 4 (four) hours as needed. 03/17/12  Yes Dorothea OgleIskra M Myers, MD  metoprolol (LOPRESSOR) 50 MG tablet Take 50 mg by mouth 2 (two) times daily.     Yes Historical Provider, MD  pantoprazole (PROTONIX) 40 MG tablet Take 1 tablet (40 mg total) by mouth daily. 03/17/12  Yes Dorothea OgleIskra M Myers,  MD  polyethylene glycol (MIRALAX / Ethelene HalGLYCOLAX) packet Take 17 g by mouth daily. 03/17/12  Yes Dorothea OgleIskra M Myers, MD  rosuvastatin (CRESTOR) 20 MG tablet Take 1 tablet (20 mg total) by mouth every evening. 05/26/13  Yes Quentin MullingAmanda Collier, PA-C  warfarin (COUMADIN) 2 MG tablet Take as directed by anticoagulation clinic 12/24/12  Yes Gaylord Shihhomas C Wall, MD   Family History  Family History  Problem Relation Age of Onset  . Diabetes Mother   . Stroke Mother   . Hypertension Brother   . Heart disease Brother   . Arthritis Sister     Rhematoid  . Arthritis Sister     Rhematoid   Social History  Social History   Social History  . Marital Status: Married    Spouse Name: N/A  . Number of Children: N/A  . Years of Education: N/A   Occupational History  . Not on file.   Social History Main Topics  . Smoking status: Never Smoker   . Smokeless tobacco: Never Used  . Alcohol Use: No  . Drug Use: No  . Sexual Activity: No   Other Topics Concern  . Not on file   Social History Narrative    Review of Systems, as per HPI, otherwise negative General:  No chills, fever, night sweats or weight changes.  Cardiovascular:  No chest pain, dyspnea on exertion, edema, orthopnea, palpitations, paroxysmal nocturnal dyspnea. Dermatological: No rash, lesions/masses Respiratory: No cough, dyspnea Urologic: No hematuria, dysuria Abdominal:   No nausea, vomiting, diarrhea, bright red blood per rectum, melena, or hematemesis Neurologic:  No visual changes, wkns, changes in mental status. All other systems reviewed and are otherwise negative except as noted above.  Physical Exam  There were no vitals taken for this visit.  General: Pleasant, NAD Psych: Normal affect. Neuro: Alert and oriented X 3. Moves all extremities spontaneously. HEENT: Normal  Neck: Supple without bruits or JVD. Lungs:  Resp regular and unlabored, CTA. Heart: RRR no s3, s4, or murmurs. Abdomen: Soft, non-tender, non-distended, BS + x  4.  Extremities: No clubbing, cyanosis , trivial peri-malleolar edema, . DP/PT/Radials 2+ and equal bilaterally.  Labs:  No results for input(s): CKTOTAL, CKMB, TROPONINI in the last 72 hours. Lab Results  Component Value Date   WBC 9.5 04/02/2015   HGB 14.8 04/02/2015   HCT 44.9 04/02/2015   MCV 96.4 04/02/2015   PLT 199 04/02/2015    No results for input(s): NA, K, CL, CO2, BUN, CREATININE, CALCIUM, PROT, BILITOT, ALKPHOS, ALT, AST, GLUCOSE in the last 168 hours.  Invalid input(s): LABALBU Lab Results  Component Value Date   CHOL 117* 02/18/2015   HDL 33* 02/18/2015   LDLCALC 54 02/18/2015   TRIG 149 02/18/2015   No results found for: DDIMER Invalid input(s): POCBNP  Accessory Clinical Findings  ECG - Atrial fibrillation, non-specific ST-T  wave changes  Procedures/Studies:  Ct Abdomen Pelvis W Contrast  03/12/2012  IMPRESSION:  1. Marked wall thickening and mucosal enhancement in the descending and sigmoid colon, compatible with severe colitis. Surrounding stranding and mild ascites. The celiac trunk and mesenteric arteries appear patent, and no intramural gas is observed in the colon. This colitis is probably infectious rather than ischemic, and if the patient has been on recent antibiotics then checking for C difficile toxins may be warranted. I doubt that the appearance is due to bowel wall hematoma.  2. Large hiatal hernia, chronic.    Echocardiogram 02/22/2013  - Left ventricle: The cavity size was normal. Systolic function was normal. The estimated ejection fraction was in the range of 55% to 60%. Wall motion was normal; there were no regional wall motion abnormalities. Doppler parameters are consistent with abnormal left ventricular relaxation (grade 1 diastolic dysfunction). Doppler parameters are consistent with elevated mean left atrial filling pressure. - Aortic valve: Mild regurgitation. - Mitral valve: Moderately calcified annulus. Mild regurgitation. -  Right ventricle: Systolic pressure was increased. - Right atrium: The atrium was mildly dilated. - Tricuspid valve: Mild regurgitation. - Pulmonary arteries: PA peak pressure: 46mm Hg (S). Impressions:  - The right ventricular systolic pressure was increased consistent with moderate pulmonary hypertension.    Assessment & Plan  1. Acute diastolic CHF - continue Lasix 20 mg po daily with KCL 10 mEq daily, Crea stable on 04/02/15.  2. Chronic a-fib, h/o CVA on chronic anticoagulation with coumadine - rate controlled, complaint to her meds, no more strokes  3. Hypertension - well controlled, however fatigue and bradycardia, decrease lopressor to 25 mg po BID.  4. Hyperlipidemia - at goal on labs from 09/2014.  5. Pulmonary hypertension - the patient is mildly asymptomatic and well compensated  Follow up in 1 year.   Lars Masson, MD, [facility] 04/20/2015, 5:38 PM

## 2015-04-21 LAB — BASIC METABOLIC PANEL
BUN: 16 mg/dL (ref 7–25)
CO2: 28 mmol/L (ref 20–31)
Calcium: 9.6 mg/dL (ref 8.6–10.4)
Chloride: 101 mmol/L (ref 98–110)
Creat: 0.8 mg/dL (ref 0.60–0.88)
Glucose, Bld: 102 mg/dL — ABNORMAL HIGH (ref 65–99)
Potassium: 4.2 mmol/L (ref 3.5–5.3)
Sodium: 138 mmol/L (ref 135–146)

## 2015-04-29 ENCOUNTER — Ambulatory Visit (INDEPENDENT_AMBULATORY_CARE_PROVIDER_SITE_OTHER): Payer: Medicare Other | Admitting: *Deleted

## 2015-04-29 DIAGNOSIS — I481 Persistent atrial fibrillation: Secondary | ICD-10-CM | POA: Diagnosis not present

## 2015-04-29 DIAGNOSIS — I6350 Cerebral infarction due to unspecified occlusion or stenosis of unspecified cerebral artery: Secondary | ICD-10-CM | POA: Diagnosis not present

## 2015-04-29 DIAGNOSIS — I4891 Unspecified atrial fibrillation: Secondary | ICD-10-CM | POA: Diagnosis not present

## 2015-04-29 DIAGNOSIS — I4819 Other persistent atrial fibrillation: Secondary | ICD-10-CM

## 2015-04-29 DIAGNOSIS — Z79899 Other long term (current) drug therapy: Secondary | ICD-10-CM | POA: Diagnosis not present

## 2015-04-29 LAB — POCT INR: INR: 2.4

## 2015-05-25 ENCOUNTER — Encounter: Payer: Self-pay | Admitting: Internal Medicine

## 2015-05-25 ENCOUNTER — Ambulatory Visit: Payer: Medicare Other | Admitting: Internal Medicine

## 2015-05-25 VITALS — BP 138/80 | HR 72 | Temp 98.0°F | Resp 16 | Ht 61.5 in | Wt 124.0 lb

## 2015-05-25 DIAGNOSIS — Z79899 Other long term (current) drug therapy: Secondary | ICD-10-CM

## 2015-05-25 DIAGNOSIS — E119 Type 2 diabetes mellitus without complications: Secondary | ICD-10-CM

## 2015-05-25 DIAGNOSIS — E785 Hyperlipidemia, unspecified: Secondary | ICD-10-CM

## 2015-05-25 DIAGNOSIS — E559 Vitamin D deficiency, unspecified: Secondary | ICD-10-CM

## 2015-05-25 DIAGNOSIS — I4819 Other persistent atrial fibrillation: Secondary | ICD-10-CM

## 2015-05-25 DIAGNOSIS — I10 Essential (primary) hypertension: Secondary | ICD-10-CM

## 2015-05-25 LAB — BASIC METABOLIC PANEL WITH GFR
BUN: 13 mg/dL (ref 7–25)
CO2: 27 mmol/L (ref 20–31)
CREATININE: 1.01 mg/dL — AB (ref 0.60–0.88)
Calcium: 9.7 mg/dL (ref 8.6–10.4)
Chloride: 103 mmol/L (ref 98–110)
GFR, EST AFRICAN AMERICAN: 57 mL/min — AB (ref >=60)
GFR, Est Non African American: 50 mL/min — ABNORMAL LOW (ref >=60)
Glucose, Bld: 121 mg/dL — ABNORMAL HIGH (ref 65–99)
Potassium: 4.1 mmol/L (ref 3.5–5.3)
Sodium: 139 mmol/L (ref 135–146)

## 2015-05-25 LAB — HEPATIC FUNCTION PANEL
ALBUMIN: 3.5 g/dL — AB (ref 3.6–5.1)
ALT: 13 U/L (ref 6–29)
AST: 19 U/L (ref 10–35)
Alkaline Phosphatase: 92 U/L (ref 33–130)
BILIRUBIN TOTAL: 0.6 mg/dL (ref 0.2–1.2)
Bilirubin, Direct: 0.1 mg/dL (ref <=0.2)
Indirect Bilirubin: 0.5 mg/dL (ref 0.2–1.2)
TOTAL PROTEIN: 6.2 g/dL (ref 6.1–8.1)

## 2015-05-25 LAB — CBC WITH DIFFERENTIAL/PLATELET
BASOS PCT: 1 % (ref 0–1)
Basophils Absolute: 0.1 10*3/uL (ref 0.0–0.1)
EOS ABS: 0.3 10*3/uL (ref 0.0–0.7)
Eosinophils Relative: 4 % (ref 0–5)
HCT: 44.8 % (ref 36.0–46.0)
Hemoglobin: 14.8 g/dL (ref 12.0–15.0)
LYMPHS ABS: 2.5 10*3/uL (ref 0.7–4.0)
Lymphocytes Relative: 40 % (ref 12–46)
MCH: 31.3 pg (ref 26.0–34.0)
MCHC: 33 g/dL (ref 30.0–36.0)
MCV: 94.7 fL (ref 78.0–100.0)
MPV: 9.7 fL (ref 8.6–12.4)
Monocytes Absolute: 0.6 10*3/uL (ref 0.1–1.0)
Monocytes Relative: 9 % (ref 3–12)
NEUTROS ABS: 2.9 10*3/uL (ref 1.7–7.7)
NEUTROS PCT: 46 % (ref 43–77)
PLATELETS: 204 10*3/uL (ref 150–400)
RBC: 4.73 MIL/uL (ref 3.87–5.11)
RDW: 13 % (ref 11.5–15.5)
WBC: 6.3 10*3/uL (ref 4.0–10.5)

## 2015-05-25 LAB — HEMOGLOBIN A1C
Hgb A1c MFr Bld: 6.7 % — ABNORMAL HIGH (ref <5.7)
MEAN PLASMA GLUCOSE: 146 mg/dL — AB (ref <117)

## 2015-05-25 LAB — LIPID PANEL
CHOL/HDL RATIO: 2.8 ratio (ref <=5.0)
CHOLESTEROL: 121 mg/dL — AB (ref 125–200)
HDL: 43 mg/dL — AB (ref >=46)
LDL Cholesterol: 51 mg/dL (ref <130)
Triglycerides: 137 mg/dL (ref <150)
VLDL: 27 mg/dL (ref <30)

## 2015-05-25 LAB — TSH: TSH: 0.631 u[IU]/mL (ref 0.350–4.500)

## 2015-05-25 NOTE — Progress Notes (Signed)
Patient ID: Lisa Hopkins, female   DOB: 11/29/1924, 81 y.o.   MRN: [id number]  Assessment and Plan:  Hypertension:  -Continue medication,  -monitor blood pressure at home.  -Continue DASH diet.   -Reminder to go to the ER if any CP, SOB, nausea, dizziness, severe HA, changes vision/speech, left arm numbness and tingling, and jaw pain.  Cholesterol: -cut crestor to 10 mg daily due to cost and well controlled cholesterol historically.   -Continue diet and exercise.  -Check cholesterol.   Diabetes: -Continue diet and exercise.  -Check A1C  Vitamin D Def: -check level -continue medications.   Arthritis -325 mg tylenol prior to bedtime  Patient declined vaccines today.  Will offer them again at next visit.   Continue diet and meds as discussed. Further disposition pending results of labs.  HPI 81 y.o. female  presents for 3 month follow up with hypertension, hyperlipidemia, prediabetes and vitamin D.   Her blood pressure has been controlled at home, today their BP is BP: 138/80 mmHg.   She does not workout. She denies chest pain, shortness of breath, dizziness.   She is on cholesterol medication and denies myalgias. Her cholesterol is at goal. The cholesterol last visit was:   Lab Results  Component Value Date   CHOL 117* 02/18/2015   HDL 33* 02/18/2015   LDLCALC 54 02/18/2015   TRIG 149 02/18/2015   CHOLHDL 3.5 02/18/2015     She has been working on diet and exercise for prediabetes, and denies foot ulcerations, hyperglycemia, hypoglycemia , increased appetite, nausea, paresthesia of the feet, polydipsia, polyuria, visual disturbances, vomiting and weight loss. Last A1C in the office was:  Lab Results  Component Value Date   HGBA1C 6.7* 02/18/2015    Patient is on Vitamin D supplement.  Lab Results  Component Value Date   VD25OH 4 02/18/2015     She reports that she has been having some trouble with her right knee waking her up at night time.  She is not taking  anything for it.  It feels weaker and feels like it is consistent with her normal arthritis pain.  She did Hopkins Lisa Hopkins on November 22 and was doing very well.  Coumadin was stable.  And Lopressor decreased to 25 mg.  She has not fallen in the last month.  She also reports that she hasn't been on any antibiotics.  She does hold on to things as she is walking.    Crestor has been increasing in price.  Patients husband is unsure whether they can afford to keep paying for this medication.    She declines vaccines today although she is due for both Tdap and also for Prevnar 13.  She understands the risks and benefits.    Current Medications:  Current Outpatient Prescriptions on File Prior to Visit  Medication Sig Dispense Refill  . acetaminophen (TYLENOL) 325 MG tablet Take 325-650 mg by mouth every 4 (four) hours as needed for mild pain, moderate pain or headache.     . Cholecalciferol (VITAMIN D) 2000 UNITS CAPS Take 2,000 Units by mouth daily.     . furosemide (LASIX) 20 MG tablet Take 1 tablet (20 mg total) by mouth daily. 90 tablet 3  . hydrochlorothiazide (HYDRODIURIL) 25 MG tablet Take 25 mg by mouth daily.    . Magnesium Hydroxide (MAGNESIA PO) Take 1 tablet by mouth daily.     . metoprolol tartrate (LOPRESSOR) 25 MG tablet Take 1 tablet (25 mg total) by mouth  2 (two) times daily. 180 tablet 3  . pantoprazole (PROTONIX) 40 MG tablet TAKE ONE TABLET BY MOUTH ONCE DAILY FOR ACID INDIGESTION. 30 tablet 3  . potassium chloride (K-DUR) 10 MEQ tablet Take 1 tablet (10 mEq total) by mouth daily. 90 tablet 3  . ranitidine (ZANTAC) 300 MG tablet TAKE ONE TABLET BY MOUTH TWICE DAILY 180 tablet 1  . rosuvastatin (CRESTOR) 20 MG tablet Take 1 tablet (20 mg total) by mouth every evening. 90 tablet 3  . warfarin (COUMADIN) 2 MG tablet TAKE AS DIRECTED BY ANTICOAGULATION CLINIC (Patient taking differently: TAKE 2MG  BY MOUTH DAILY EXCEPT TAKE 1MG  ON TUES,THURS,SAT) 35 tablet 3   No current  facility-administered medications on file prior to visit.    Medical History:  Past Medical History  Diagnosis Date  . Atrial fibrillation (HCC)   . Aphasia as late effect of cerebrovascular accident   . CVA (cerebral infarction)   . Hyperlipidemia   . HTN (hypertension)   . Anxiety   . Diabetes mellitus type II   . Hiatal hernia   . GERD (gastroesophageal reflux disease)   . IBS (irritable bowel syndrome)   . Vitamin D deficiency   . Stroke [facility](HCC)     Allergies:  Allergies  Allergen Reactions  . Ace Inhibitors     Cough  . Fosamax [Alendronate Sodium]     Heart burn  . Norvasc [Amlodipine Besylate]     edema  . Zocor [Simvastatin]     Elevated CPK     Review of Systems:  Review of Systems  Constitutional: Negative for fever, chills and weight loss.  HENT: Negative for congestion, ear pain and sore throat.   Respiratory: Negative for cough, shortness of breath and wheezing.   Cardiovascular: Negative for chest pain, palpitations and leg swelling.  Gastrointestinal: Negative for heartburn, diarrhea, constipation, blood in stool and melena.  Genitourinary: Negative.   Neurological: Negative for dizziness, sensory change, loss of consciousness and headaches.  Psychiatric/Behavioral: Negative for depression. The patient is not nervous/anxious and does not have insomnia.     Family history- Review and unchanged  Social history- Review and unchanged  Physical Exam: BP 138/80 mmHg  Pulse 72  Temp(Src) 98 F (36.7 C) (Temporal)  Resp 16  Ht 5' 1.5" (1.562 m)  Wt 124 lb (56.246 kg)  BMI 23.05 kg/m2 Wt Readings from Last 3 Encounters:  05/25/15 124 lb (56.246 kg)  02/18/15 122 lb 12.8 oz (55.702 kg)  11/26/14 125 lb (56.7 kg)    General Appearance: Well nourished well developed, in no apparent distress. Eyes: PERRLA, EOMs, conjunctiva no swelling or erythema ENT/Mouth: Ear canals normal without obstruction, swelling, erythma, discharge.  TMs normal bilaterally.   Oropharynx moist, clear, without exudate, or postoropharyngeal swelling. Neck: Supple, thyroid normal,no cervical adenopathy  Respiratory: Respiratory effort normal, Breath sounds clear A&P without rhonchi, wheeze, or rale.  No retractions, no accessory usage. Cardio: Ireg ireg with no RGs. Brisk peripheral pulses without edema.  Abdomen: Soft, + BS,  Non tender, no guarding, rebound, hernias, masses. Musculoskeletal: Full ROM, 5/5 strength, Normal gait Skin: Warm, dry without rashes, lesions, ecchymosis.  Neuro: Awake and oriented X 3, Cranial nerves intact. Normal muscle tone, no cerebellar symptoms. Psych: Normal affect, Mildly poor short term memory Insight and Judgment appropriate.    Terri Piedraourtney Forcucci, PA-C 11:24 AM [facility]

## 2015-05-25 NOTE — Patient Instructions (Signed)
Please take a 1/2 tablet per day of the crestor before bedtime.  Please make sure you drink plenty of water.    Please cut back on starchy foods like bread, rice, potatoes, and sweets.

## 2015-05-27 ENCOUNTER — Ambulatory Visit (INDEPENDENT_AMBULATORY_CARE_PROVIDER_SITE_OTHER): Payer: Medicare Other | Admitting: *Deleted

## 2015-05-27 DIAGNOSIS — Z79899 Other long term (current) drug therapy: Secondary | ICD-10-CM

## 2015-05-27 DIAGNOSIS — I4819 Other persistent atrial fibrillation: Secondary | ICD-10-CM

## 2015-05-27 DIAGNOSIS — I4891 Unspecified atrial fibrillation: Secondary | ICD-10-CM

## 2015-05-27 DIAGNOSIS — I6350 Cerebral infarction due to unspecified occlusion or stenosis of unspecified cerebral artery: Secondary | ICD-10-CM | POA: Diagnosis not present

## 2015-05-27 DIAGNOSIS — I481 Persistent atrial fibrillation: Secondary | ICD-10-CM

## 2015-05-27 LAB — POCT INR: INR: 3

## 2015-06-22 ENCOUNTER — Other Ambulatory Visit: Payer: Self-pay | Admitting: Internal Medicine

## 2015-06-23 ENCOUNTER — Other Ambulatory Visit: Payer: Self-pay | Admitting: Internal Medicine

## 2015-06-23 DIAGNOSIS — K589 Irritable bowel syndrome without diarrhea: Secondary | ICD-10-CM

## 2015-06-23 MED ORDER — HYOSCYAMINE SULFATE 0.125 MG PO TABS: ORAL_TABLET | ORAL | Status: DC

## 2015-06-24 ENCOUNTER — Other Ambulatory Visit: Payer: Self-pay | Admitting: Internal Medicine

## 2015-06-24 ENCOUNTER — Ambulatory Visit (INDEPENDENT_AMBULATORY_CARE_PROVIDER_SITE_OTHER): Payer: Medicare Other | Admitting: *Deleted

## 2015-06-24 DIAGNOSIS — K589 Irritable bowel syndrome without diarrhea: Secondary | ICD-10-CM

## 2015-06-24 DIAGNOSIS — I481 Persistent atrial fibrillation: Secondary | ICD-10-CM | POA: Diagnosis not present

## 2015-06-24 DIAGNOSIS — Z79899 Other long term (current) drug therapy: Secondary | ICD-10-CM

## 2015-06-24 DIAGNOSIS — I4891 Unspecified atrial fibrillation: Secondary | ICD-10-CM

## 2015-06-24 DIAGNOSIS — I6350 Cerebral infarction due to unspecified occlusion or stenosis of unspecified cerebral artery: Secondary | ICD-10-CM

## 2015-06-24 DIAGNOSIS — I4819 Other persistent atrial fibrillation: Secondary | ICD-10-CM

## 2015-06-24 LAB — POCT INR: INR: 3

## 2015-06-24 MED ORDER — DICYCLOMINE HCL 20 MG PO TABS: ORAL_TABLET | ORAL | Status: DC

## 2015-06-25 ENCOUNTER — Telehealth: Payer: Self-pay | Admitting: *Deleted

## 2015-06-25 NOTE — Telephone Encounter (Signed)
Patient's husband called to inform us that the patient is will be taking Dicyclomine  as needed for bloating, cramping, and diarrhea. She got the prescription yesterday.  Advised that the medication does not interfere with Coumadin & he verbalized understanding.

## 2015-06-29 ENCOUNTER — Other Ambulatory Visit: Payer: Self-pay | Admitting: Internal Medicine

## 2015-06-29 MED ORDER — ATORVASTATIN CALCIUM 40 MG PO TABS: ORAL_TABLET | ORAL | Status: DC

## 2015-07-02 ENCOUNTER — Other Ambulatory Visit: Payer: Self-pay | Admitting: Cardiology

## 2015-07-26 ENCOUNTER — Other Ambulatory Visit: Payer: Self-pay | Admitting: Internal Medicine

## 2015-07-29 ENCOUNTER — Ambulatory Visit (INDEPENDENT_AMBULATORY_CARE_PROVIDER_SITE_OTHER): Payer: Medicare Other | Admitting: *Deleted

## 2015-07-29 DIAGNOSIS — I481 Persistent atrial fibrillation: Secondary | ICD-10-CM

## 2015-07-29 DIAGNOSIS — I6350 Cerebral infarction due to unspecified occlusion or stenosis of unspecified cerebral artery: Secondary | ICD-10-CM

## 2015-07-29 DIAGNOSIS — I4891 Unspecified atrial fibrillation: Secondary | ICD-10-CM

## 2015-07-29 DIAGNOSIS — Z79899 Other long term (current) drug therapy: Secondary | ICD-10-CM | POA: Diagnosis not present

## 2015-07-29 DIAGNOSIS — I4819 Other persistent atrial fibrillation: Secondary | ICD-10-CM

## 2015-07-29 LAB — POCT INR: INR: 2.3

## 2015-08-24 ENCOUNTER — Other Ambulatory Visit: Payer: Self-pay | Admitting: Internal Medicine

## 2015-08-27 ENCOUNTER — Ambulatory Visit (INDEPENDENT_AMBULATORY_CARE_PROVIDER_SITE_OTHER): Payer: Medicare Other | Admitting: Internal Medicine

## 2015-08-27 ENCOUNTER — Encounter: Payer: Self-pay | Admitting: Internal Medicine

## 2015-08-27 VITALS — BP 118/66 | HR 82 | Temp 97.7°F | Resp 14 | Ht 61.5 in | Wt 122.0 lb

## 2015-08-27 DIAGNOSIS — E1129 Type 2 diabetes mellitus with other diabetic kidney complication: Secondary | ICD-10-CM | POA: Diagnosis not present

## 2015-08-27 DIAGNOSIS — I4891 Unspecified atrial fibrillation: Secondary | ICD-10-CM

## 2015-08-27 DIAGNOSIS — I10 Essential (primary) hypertension: Secondary | ICD-10-CM | POA: Diagnosis not present

## 2015-08-27 DIAGNOSIS — N182 Chronic kidney disease, stage 2 (mild): Secondary | ICD-10-CM

## 2015-08-27 DIAGNOSIS — E785 Hyperlipidemia, unspecified: Secondary | ICD-10-CM | POA: Diagnosis not present

## 2015-08-27 DIAGNOSIS — K219 Gastro-esophageal reflux disease without esophagitis: Secondary | ICD-10-CM | POA: Diagnosis not present

## 2015-08-27 DIAGNOSIS — E1122 Type 2 diabetes mellitus with diabetic chronic kidney disease: Secondary | ICD-10-CM | POA: Diagnosis not present

## 2015-08-27 DIAGNOSIS — Z79899 Other long term (current) drug therapy: Secondary | ICD-10-CM

## 2015-08-27 DIAGNOSIS — E559 Vitamin D deficiency, unspecified: Secondary | ICD-10-CM

## 2015-08-27 LAB — CBC WITH DIFFERENTIAL/PLATELET
Basophils Absolute: 0 10*3/uL (ref 0.0–0.1)
Basophils Relative: 0 % (ref 0–1)
EOS PCT: 4 % (ref 0–5)
Eosinophils Absolute: 0.3 10*3/uL (ref 0.0–0.7)
HEMATOCRIT: 46.5 % — AB (ref 36.0–46.0)
HEMOGLOBIN: 16 g/dL — AB (ref 12.0–15.0)
LYMPHS ABS: 2.5 10*3/uL (ref 0.7–4.0)
Lymphocytes Relative: 35 % (ref 12–46)
MCH: 32 pg (ref 26.0–34.0)
MCHC: 34.4 g/dL (ref 30.0–36.0)
MCV: 93 fL (ref 78.0–100.0)
MONO ABS: 0.6 10*3/uL (ref 0.1–1.0)
MPV: 9.8 fL (ref 8.6–12.4)
Monocytes Relative: 9 % (ref 3–12)
NEUTROS ABS: 3.7 10*3/uL (ref 1.7–7.7)
NEUTROS PCT: 52 % (ref 43–77)
Platelets: 216 10*3/uL (ref 150–400)
RBC: 5 MIL/uL (ref 3.87–5.11)
RDW: 13.7 % (ref 11.5–15.5)
WBC: 7.2 10*3/uL (ref 4.0–10.5)

## 2015-08-27 NOTE — Progress Notes (Signed)
Patient ID: Lisa Hopkins, female   DOB: 21-May-1925, 82 y.o.   MRN: [id number]   This very nice 82 y.o. MWF presents for  follow up with Hypertension, Hyperlipidemia, Pre-Diabetes and Vitamin D Deficiency. Patient also has hx/o GERD which recently has been quiescent.    Patient is treated for HTN circa 1992 & BP has been controlled at home. Today's BP: 118/66 mmHg. 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 Patient has residual poor ST recall and dysnomia. She does use a cane or walker due to unstable gait and is considered a moderate to high fall risk. Her [facility]. Patient is followed by Dr Eloy EndK. Nelson.   Patient has had no complaints of any cardiac type chest pain, palpitations, dyspnea/orthopnea/PND, dizziness, claudication, or dependent edema.   Hyperlipidemia is controlled with diet & meds. Patient denies myalgias or other med SE's. Last Lipids were  Cholesterol 121*; HDL 43*; LDL 51; Triglycerides 137 on 05/25/2015.   Also, the patient has history of T2_NIDDM w/CKD 2 since 2003 and she's attempted to control this with diet and has had no symptoms of reactive hypoglycemia, diabetic polys, paresthesias or visual blurring.  Last A1c was no at goal with  6.7% on 05/25/2015.   Further, the patient also has history of Vitamin D Deficiency and supplements vitamin D without any suspected side-effects. Last vitamin D was 56 on 02/18/2015.   Medication Sig  . acetaminophen (TYLENOL) 325 MG tablet Take 325-650 mg by mouth every 4 (four) hours as needed for mild pain, moderate pain or headache.   Marland Kitchen. atorvastatin (LIPITOR) 40 MG tablet Take 1/2 tablet to 1 tablet daily for cholesterol.  . Cholecalciferol (VITAMIN D) 2000 UNITS CAPS Take 2,000 Units by mouth daily.   Marland Kitchen. dicyclomine (BENTYL) 20 MG tablet Take 1 tablet 3 x day if needed for nausea, bloating , cramping or diarrhea  . furosemide (LASIX) 20 MG tablet  Take 1 tablet (20 mg total) by mouth daily.  . Magnesium Hydroxide (MAGNESIA PO) Take 1 tablet by mouth daily.   . metoprolol tartrate (LOPRESSOR) 25 MG tablet Take 1 tablet (25 mg total) by mouth 2 (two) times daily.  . pantoprazole (PROTONIX) 40 MG tablet TAKE ONE TABLET BY MOUTH ONCE DAILY FOR  ACID  INDIGESTION  . potassium chloride (K-DUR) 10 MEQ tablet Take 1 tablet (10 mEq total) by mouth daily.  . ranitidine (ZANTAC) 300 MG tablet TAKE ONE TABLET BY MOUTH TWICE DAILY  . warfarin (COUMADIN) 2 MG tablet TAKE AS DIRECTED BY ANTICOAGULATION CLINIC.   Allergies  Allergen Reactions  . Ace Inhibitors     Cough  . Fosamax [Alendronate Sodium]     Heart burn  . Norvasc [Amlodipine Besylate]     edema  . Zocor [Simvastatin]     Elevated CPK   PMHx:   Past Medical History  Diagnosis Date  . Atrial fibrillation (HCC)   . Aphasia as late effect of cerebrovascular accident   . CVA (cerebral infarction)   . Hyperlipidemia   . HTN (hypertension)   . Anxiety   . Diabetes mellitus type II   . Hiatal hernia   . GERD (gastroesophageal reflux disease)   . IBS (irritable bowel syndrome)   . Vitamin D deficiency   . Stroke [facility](HCC)    Immunization History  Administered Date(s) Administered  . DT 02/18/2015  . Influenza, High Dose Seasonal PF 03/05/2014, 02/18/2015  .  Influenza-Unspecified 01/30/2013  . Pneumococcal-Unspecified 04/27/2004  . Td 06/28/2003  . Zoster 04/27/2009   Past Surgical History  Procedure Laterality Date  . Upper gastrointestinal endoscopy  2005  . Colonoscopy  2005  . Cholecystectomy    . Cataract extraction, bilateral    . Tonsillectomy     FHx:    Reviewed / unchanged  SHx:    Reviewed / unchanged  Systems Review:  Constitutional: Denies fever, chills, wt changes, headaches, insomnia, fatigue, night sweats, change in appetite. Eyes: Denies redness, blurred vision, diplopia, discharge, itchy, watery eyes.  ENT: Denies discharge, congestion, post nasal  drip, epistaxis, sore throat, earache, hearing loss, dental pain, tinnitus, vertigo, sinus pain, snoring.  CV: Denies chest pain, palpitations, irregular heartbeat, syncope, dyspnea, diaphoresis, orthopnea, PND, claudication or edema. Respiratory: denies cough, dyspnea, DOE, pleurisy, hoarseness, laryngitis, wheezing.  Gastrointestinal: Denies dysphagia, odynophagia, heartburn, reflux, water brash, abdominal pain or cramps, nausea, vomiting, bloating, diarrhea, constipation, hematemesis, melena, hematochezia  or hemorrhoids. Genitourinary: Denies dysuria, frequency, urgency, nocturia, hesitancy, discharge, hematuria or flank pain. Musculoskeletal: Denies arthralgias, myalgias, stiffness, jt. swelling, pain, limping or strain/sprain.  Skin: Denies pruritus, rash, hives, warts, acne, eczema or change in skin lesion(s). Neuro: No weakness, tremor, incoordination, spasms, paresthesia or pain. Psychiatric: Denies confusion, memory loss or sensory loss. Endo: Denies change in weight, skin or hair change.  Heme/Lymph: No excessive bleeding, bruising or enlarged lymph nodes.  Physical Exam  BP 118/66 mmHg  Pulse 82  Temp(Src) 97.7 F (36.5 C) (Temporal)  Resp 14  Ht 5' 1.5" (1.562 m)  Wt 122 lb (55.339 kg)  BMI 22.68 kg/m2  SpO2 95%  Appears well nourished and in no distress. Eyes: PERRLA, EOMs, conjunctiva no swelling or erythema. Sinuses: No frontal/maxillary tenderness ENT/Mouth: EAC's clear, TM's nl w/o erythema, bulging. Nares clear w/o erythema, swelling, exudates. Oropharynx clear without erythema or exudates. Oral hygiene is good. Tongue normal, non obstructing. Hearing intact.  Neck: Supple. Thyroid nl. Car 2+/2+ without bruits, nodes or JVD. Chest: Respirations nl with BS clear & equal w/o rales, rhonchi, wheezing or stridor.  Cor: Heart sounds softw/ sl irregular rate and rhythm without sig. murmurs, gallops, clicks, or rubs. Peripheral pulses normal and equal  without edema.   Abdomen: Soft & bowel sounds normal. Non-tender w/o guarding, rebound, hernias, masses, or organomegaly.  Lymphatics: Unremarkable.  Musculoskeletal: Full ROM all peripheral extremities, joint stability, 5/5 strength, and normal gait.  Skin: Warm, dry without exposed rashes, lesions or ecchymosis apparent.  Neuro: Cranial nerves intact, reflexes equal bilaterally. Sensory-motor testing grossly intact. Tendon reflexes grossly intact.  Pysch: Alert & oriented x 3.  Insight and judgement nl & appropriate. No ideations.  Assessment and Plan:  1. Essential hypertension  - TSH  2. Hyperlipidemia  - Lipid panel - TSH  3. Type 2 diabetes mellitus with stage 2 chronic kidney disease, without long-term current use of insulin (HCC)  - Hemoglobin A1c - Insulin, random  4. Vitamin D deficiency  - VITAMIN D 25 Hydroxy   5. Gastroesophageal reflux disease   6. Atrial fibrillation, unspecified type (HCC)   7. Medication management  - CBC with Differential/Platelet - BASIC METABOLIC PANEL WITH GFR - Hepatic function panel - Magnesium   Recommended regular exercise, BP monitoring, weight control, and discussed med and SE's. Recommended labs to assess and monitor clinical status. Further disposition pending results of labs. Over 30 minutes of exam, counseling, chart review was performed

## 2015-08-27 NOTE — Patient Instructions (Signed)

## 2015-08-28 LAB — BASIC METABOLIC PANEL WITH GFR
BUN: 17 mg/dL (ref 7–25)
CHLORIDE: 101 mmol/L (ref 98–110)
CO2: 25 mmol/L (ref 20–31)
Calcium: 9.9 mg/dL (ref 8.6–10.4)
Creat: 0.96 mg/dL — ABNORMAL HIGH (ref 0.60–0.88)
GFR, EST NON AFRICAN AMERICAN: 53 mL/min — AB (ref >=60)
GFR, Est African American: 61 mL/min (ref >=60)
GLUCOSE: 137 mg/dL — AB (ref 65–99)
POTASSIUM: 4.1 mmol/L (ref 3.5–5.3)
SODIUM: 140 mmol/L (ref 135–146)

## 2015-08-28 LAB — HEMOGLOBIN A1C
Hgb A1c MFr Bld: 6.8 % — ABNORMAL HIGH (ref <5.7)
Mean Plasma Glucose: 148 mg/dL

## 2015-08-28 LAB — LIPID PANEL
CHOL/HDL RATIO: 3.2 ratio (ref <=5.0)
CHOLESTEROL: 141 mg/dL (ref 125–200)
HDL: 44 mg/dL — ABNORMAL LOW (ref >=46)
LDL CALC: 75 mg/dL (ref <130)
Triglycerides: 111 mg/dL (ref <150)
VLDL: 22 mg/dL (ref <30)

## 2015-08-28 LAB — VITAMIN D 25 HYDROXY (VIT D DEFICIENCY, FRACTURES): VIT D 25 HYDROXY: 53 ng/mL (ref 30–100)

## 2015-08-28 LAB — HEPATIC FUNCTION PANEL
ALK PHOS: 137 U/L — AB (ref 33–130)
ALT: 15 U/L (ref 6–29)
AST: 16 U/L (ref 10–35)
Albumin: 4.1 g/dL (ref 3.6–5.1)
BILIRUBIN DIRECT: 0.2 mg/dL (ref <=0.2)
BILIRUBIN INDIRECT: 0.7 mg/dL (ref 0.2–1.2)
TOTAL PROTEIN: 6.8 g/dL (ref 6.1–8.1)
Total Bilirubin: 0.9 mg/dL (ref 0.2–1.2)

## 2015-08-28 LAB — MAGNESIUM: Magnesium: 1.8 mg/dL (ref 1.5–2.5)

## 2015-08-28 LAB — TSH: TSH: 1.13 m[IU]/L

## 2015-08-30 LAB — INSULIN, RANDOM: Insulin: 17 u[IU]/mL (ref 2.0–19.6)

## 2015-09-09 ENCOUNTER — Ambulatory Visit (INDEPENDENT_AMBULATORY_CARE_PROVIDER_SITE_OTHER): Payer: Medicare Other | Admitting: *Deleted

## 2015-09-09 DIAGNOSIS — Z79899 Other long term (current) drug therapy: Secondary | ICD-10-CM | POA: Diagnosis not present

## 2015-09-09 DIAGNOSIS — I4891 Unspecified atrial fibrillation: Secondary | ICD-10-CM | POA: Diagnosis not present

## 2015-09-09 LAB — POCT INR: INR: 1.9

## 2015-10-05 ENCOUNTER — Ambulatory Visit (INDEPENDENT_AMBULATORY_CARE_PROVIDER_SITE_OTHER): Payer: Medicare Other | Admitting: Internal Medicine

## 2015-10-05 ENCOUNTER — Encounter: Payer: Self-pay | Admitting: Internal Medicine

## 2015-10-05 VITALS — BP 124/80 | HR 68 | Temp 97.4°F | Resp 16 | Ht 61.5 in | Wt 122.4 lb

## 2015-10-05 DIAGNOSIS — J0410 Acute tracheitis without obstruction: Secondary | ICD-10-CM

## 2015-10-05 MED ORDER — AZITHROMYCIN 250 MG PO TABS: ORAL_TABLET | ORAL | Status: DC

## 2015-10-05 MED ORDER — PROMETHAZINE-PHENYLEPHRINE 6.25-5 MG/5ML PO SYRP: ORAL_SOLUTION | ORAL | Status: DC

## 2015-10-05 MED ORDER — PREDNISONE 20 MG PO TABS: ORAL_TABLET | ORAL | Status: DC

## 2015-10-05 NOTE — Patient Instructions (Signed)
Cut coumadin dose in 1/2 for next 10 days   for being on Azithromycin

## 2015-10-05 NOTE — Progress Notes (Signed)
Subjective:    Patient ID: Lisa Hopkins, female    DOB: [birth year], 82 y.o.   MRN: [id number]  HPI This delightful 82 yo MWF with multiple medical co-morbidities presented acutely with a 1+ week hx/o head & chest congestion. Denied fever, chills, sweats and dyspnea.   Medication Sig  . acetaminophen  325 MG tablet Take 325-650 mg by mouth every 4 (four) hours as needed for mild pain, moderate pain or headache.   Marland Kitchen atorvastatin (LIPITOR) 40 MG tablet Take 1/2 tablet to 1 tablet daily for cholesterol.  Marland Kitchen VITAMIN D 2000 UNITS CAPS Take 2,000 Units by mouth daily.   Marland Kitchen dicyclomine (BENTYL) 20 MG tablet Take 1 tablet 3 x day if needed for nausea, bloating , cramping or diarrhea  . furosemide (LASIX) 20 MG tablet Take 1 tablet (20 mg total) by mouth daily.  . Magnesium Hydroxide (MAGNESIA PO) Take 1 tablet by mouth daily.   . metoprolol tartrate  25 MG tablet Take 1 tablet (25 mg total) by mouth 2 (two) times daily.  . pantoprazole  40 MG tablet TAKE ONE TABLET BY MOUTH ONCE DAILY FOR  ACID  INDIGESTION  . potassium chloride 10 MEQ tablet Take 1 tablet (10 mEq total) by mouth daily.  . Ranitidine 300 MG tablet TAKE ONE TABLET BY MOUTH TWICE DAILY  . warfarin (COUMADIN) 2 MG tablet TAKE AS DIRECTED BY ANTICOAGULATION CLINIC.   Allergies  Allergen Reactions  . Ace Inhibitors     Cough  . Fosamax [Alendronate Sodium]     Heart burn  . Norvasc [Amlodipine Besylate]     edema  . Zocor [Simvastatin]     Elevated CPK   Past Medical History  Diagnosis Date  . Atrial fibrillation (HCC)   . Aphasia as late effect of cerebrovascular accident   . CVA (cerebral infarction)   . Hyperlipidemia   . HTN (hypertension)   . Anxiety   . Diabetes mellitus type II   . Hiatal hernia   . GERD (gastroesophageal reflux disease)   . IBS (irritable bowel syndrome)   . Vitamin D deficiency   . Stroke [facility])    Review of Systems  10 point systems review negative except as above.    Objective:   Physical Exam  BP 124/80 mmHg  Pulse 68  Temp(Src) 97.4 F (36.3 C)  Resp 16  Ht 5' 1.5" (1.562 m)  Wt 122 lb 6.4 oz (55.52 kg)  BMI 22.76 kg/m2   Congested cough. No respiratory stridor. O2 sat 94-95% on Rm Air at rest  HEENT - Eac's patent. TM's Nl. EOM's full. PERRLA. NasoOroPharynx clear. Neck - supple. Nl Thyroid. Sl Maxillary tenderness. Carotids 2+ & No bruits, nodes, JVD Chest - Bilateral scattered coarse rales and  Rhonchi an forced post tussive expiratory wheezes. Cor - Nl HS. RRR w/o sig MGR. PP 1(+). No edema. MS- FROM w/o deformities. Muscle power, tone and bulk Nl. Gait Nl. Neuro - No obvious Cr N abnormalities. Sensory, motor and Cerebellar functions appear Nl w/o focal abnormalities.    Assessment & Plan:   1. Tracheitis  - predniSONE (DELTASONE) 20 MG tablet; 1 tab 3 x day for 2 days, then 1 tab 2 x day for 2 days, then 1 tab 1 x day for 3 days  Dispense: 13 tablet; Refill: 0 - azithromycin (ZITHROMAX) 250 MG tablet; Take 2 tablets (500 mg) on  Day 1,  followed by 1 tablet (250 mg) once daily on Days 2 through 5.  Dispense: 6 each; Refill: 1 - Rx Phenergan VC  8 oz

## 2015-10-07 ENCOUNTER — Ambulatory Visit (INDEPENDENT_AMBULATORY_CARE_PROVIDER_SITE_OTHER): Payer: Medicare Other | Admitting: *Deleted

## 2015-10-07 DIAGNOSIS — Z79899 Other long term (current) drug therapy: Secondary | ICD-10-CM

## 2015-10-07 DIAGNOSIS — I4891 Unspecified atrial fibrillation: Secondary | ICD-10-CM | POA: Diagnosis not present

## 2015-10-07 LAB — POCT INR: INR: 1.3

## 2015-10-11 ENCOUNTER — Ambulatory Visit (INDEPENDENT_AMBULATORY_CARE_PROVIDER_SITE_OTHER): Payer: Medicare Other

## 2015-10-11 DIAGNOSIS — Z79899 Other long term (current) drug therapy: Secondary | ICD-10-CM | POA: Diagnosis not present

## 2015-10-11 DIAGNOSIS — I4891 Unspecified atrial fibrillation: Secondary | ICD-10-CM

## 2015-10-11 LAB — POCT INR: INR: 2.6

## 2015-10-31 ENCOUNTER — Other Ambulatory Visit: Payer: Self-pay | Admitting: Internal Medicine

## 2015-11-02 ENCOUNTER — Other Ambulatory Visit: Payer: Self-pay | Admitting: Internal Medicine

## 2015-11-04 ENCOUNTER — Ambulatory Visit (INDEPENDENT_AMBULATORY_CARE_PROVIDER_SITE_OTHER): Payer: Medicare Other | Admitting: *Deleted

## 2015-11-04 DIAGNOSIS — Z79899 Other long term (current) drug therapy: Secondary | ICD-10-CM | POA: Diagnosis not present

## 2015-11-04 DIAGNOSIS — I4891 Unspecified atrial fibrillation: Secondary | ICD-10-CM | POA: Diagnosis not present

## 2015-11-04 LAB — POCT INR: INR: 2.2

## 2015-12-02 ENCOUNTER — Ambulatory Visit (INDEPENDENT_AMBULATORY_CARE_PROVIDER_SITE_OTHER): Payer: Medicare Other | Admitting: *Deleted

## 2015-12-02 DIAGNOSIS — Z79899 Other long term (current) drug therapy: Secondary | ICD-10-CM | POA: Diagnosis not present

## 2015-12-02 DIAGNOSIS — I4891 Unspecified atrial fibrillation: Secondary | ICD-10-CM

## 2015-12-02 LAB — POCT INR: INR: 2

## 2015-12-03 ENCOUNTER — Ambulatory Visit (INDEPENDENT_AMBULATORY_CARE_PROVIDER_SITE_OTHER): Payer: Medicare Other | Admitting: Physician Assistant

## 2015-12-03 ENCOUNTER — Encounter: Payer: Self-pay | Admitting: Physician Assistant

## 2015-12-03 VITALS — BP 126/60 | HR 65 | Temp 97.9°F | Resp 14 | Ht 61.5 in | Wt 122.4 lb

## 2015-12-03 DIAGNOSIS — I4891 Unspecified atrial fibrillation: Secondary | ICD-10-CM | POA: Diagnosis not present

## 2015-12-03 DIAGNOSIS — E785 Hyperlipidemia, unspecified: Secondary | ICD-10-CM | POA: Diagnosis not present

## 2015-12-03 DIAGNOSIS — I5030 Unspecified diastolic (congestive) heart failure: Secondary | ICD-10-CM

## 2015-12-03 DIAGNOSIS — I6310 Cerebral infarction due to embolism of unspecified precerebral artery: Secondary | ICD-10-CM

## 2015-12-03 DIAGNOSIS — Z0001 Encounter for general adult medical examination with abnormal findings: Secondary | ICD-10-CM | POA: Diagnosis not present

## 2015-12-03 DIAGNOSIS — R6889 Other general symptoms and signs: Secondary | ICD-10-CM | POA: Diagnosis not present

## 2015-12-03 DIAGNOSIS — E1122 Type 2 diabetes mellitus with diabetic chronic kidney disease: Secondary | ICD-10-CM

## 2015-12-03 DIAGNOSIS — F411 Generalized anxiety disorder: Secondary | ICD-10-CM | POA: Diagnosis not present

## 2015-12-03 DIAGNOSIS — K219 Gastro-esophageal reflux disease without esophagitis: Secondary | ICD-10-CM

## 2015-12-03 DIAGNOSIS — E559 Vitamin D deficiency, unspecified: Secondary | ICD-10-CM

## 2015-12-03 DIAGNOSIS — E1140 Type 2 diabetes mellitus with diabetic neuropathy, unspecified: Secondary | ICD-10-CM

## 2015-12-03 DIAGNOSIS — Z79899 Other long term (current) drug therapy: Secondary | ICD-10-CM

## 2015-12-03 DIAGNOSIS — I700 Atherosclerosis of aorta: Secondary | ICD-10-CM

## 2015-12-03 DIAGNOSIS — M7070 Other bursitis of hip, unspecified hip: Secondary | ICD-10-CM

## 2015-12-03 DIAGNOSIS — N182 Chronic kidney disease, stage 2 (mild): Secondary | ICD-10-CM | POA: Diagnosis not present

## 2015-12-03 DIAGNOSIS — I10 Essential (primary) hypertension: Secondary | ICD-10-CM

## 2015-12-03 DIAGNOSIS — I69920 Aphasia following unspecified cerebrovascular disease: Secondary | ICD-10-CM

## 2015-12-03 DIAGNOSIS — Z0000 Encounter for general adult medical examination without abnormal findings: Secondary | ICD-10-CM

## 2015-12-03 LAB — CBC WITH DIFFERENTIAL/PLATELET
BASOS ABS: 72 {cells}/uL (ref 0–200)
BASOS PCT: 1 %
Eosinophils Absolute: 144 cells/uL (ref 15–500)
Eosinophils Relative: 2 %
HCT: 45.7 % — ABNORMAL HIGH (ref 35.0–45.0)
Hemoglobin: 15.6 g/dL — ABNORMAL HIGH (ref 11.7–15.5)
LYMPHS ABS: 2952 {cells}/uL (ref 850–3900)
LYMPHS PCT: 41 %
MCH: 32.4 pg (ref 27.0–33.0)
MCHC: 34.1 g/dL (ref 32.0–36.0)
MCV: 94.8 fL (ref 80.0–100.0)
MONOS PCT: 8 %
MPV: 10.1 fL (ref 7.5–12.5)
Monocytes Absolute: 576 cells/uL (ref 200–950)
NEUTROS PCT: 48 %
Neutro Abs: 3456 cells/uL (ref 1500–7800)
PLATELETS: 183 10*3/uL (ref 140–400)
RBC: 4.82 MIL/uL (ref 3.80–5.10)
RDW: 13.5 % (ref 11.0–15.0)
WBC: 7.2 10*3/uL (ref 3.8–10.8)

## 2015-12-03 LAB — TSH: TSH: 0.85 mIU/L

## 2015-12-03 LAB — HEMOGLOBIN A1C
Hgb A1c MFr Bld: 6.7 % — ABNORMAL HIGH (ref <5.7)
Mean Plasma Glucose: 146 mg/dL

## 2015-12-03 NOTE — Progress Notes (Signed)
MEDICARE ANNUAL WELLNESS VISIT AND FOLLOW UP  Assessment:    Medicare annual wellness visit, subsequent  - Declines prevnar at this time  Essential hypertension -     CBC with Differential/Platelet -     BASIC METABOLIC PANEL WITH GFR -     Hepatic function panel -     TSH -      DASH diet  Hyperlipidemia -     Lipid panel -- continue medications, check lipids, decrease fatty foods, increase activity.   Type 2 diabetes mellitus with stage 2 chronic kidney disease, without long-term current use of insulin (HCC) -     Hemoglobin A1c -  Discussed general issues about diabetes pathophysiology and management., Educational material distributed., Suggested low cholesterol diet., Encouraged aerobic exercise., Discussed foot care., Reminded to get yearly retinal exam.   Atrial fibrillation, unspecified type (HCC)  - on coumadin continue follow cards  Diastolic congestive heart failure, unspecified congestive heart failure chronicity (HCC)  - weight stable, continue medications, follow cards  Vitamin D deficiency  Medication management -     Magnesium  APHASIA DUE TO CEREBROVASCULAR DISEASE  -Control blood pressure, cholesterol, glucose, increase exercise.   Cerebrovascular accident (CVA) due to embolism of precerebral artery (HCC)  -Control blood pressure, cholesterol, glucose, increase exercise.   Anxiety state  - controlled  Bursitis of hip, unspecified laterality  - resolving  Gastroesophageal reflux disease, esophagitis presence not specified  -Continue PPI/H2 blocker, diet discussed  Type 2 diabetes mellitus with diabetic neuropathy, without long-term current use of insulin (HCC)  - Discussed general issues about diabetes pathophysiology and management., Educational material distributed., Suggested low cholesterol diet., Encouraged aerobic exercise., Discussed foot care., Reminded to get yearly retinal exam.  Atherosclerosis of Aorta  -  Control blood pressure,  cholesterol, glucose, increase exercise.     Over 30 minutes of exam, counseling, chart review, and critical decision making was performed  Future Appointments Date Time Provider Department Center  12/30/2015 11:15 AM [facility]  03/16/2016 10:00 AM [name], MD [facility] None    Plan:   During the course of the visit the patient was educated and counseled about appropriate screening and preventive services including:    Pneumococcal vaccine   Influenza vaccine  Td vaccine  Prevnar 13  Screening electrocardiogram  Screening mammography  Bone densitometry screening  Colorectal cancer screening  Diabetes screening  Glaucoma screening  Nutrition counseling   Advanced directives: given info/requested copies   Subjective:   Lisa Hopkins is a 82 y.o. female who presents for Medicare Annual Wellness Visit and 3 month follow up on hypertension, diabetes, hyperlipidemia, vitamin D def.   Her blood pressure has been controlled at home, today their BP is BP: 126/60 mmHg  She has afib, on coumadin, has history of CVA with poor ST recall and dysomnia. Follows with cardiology, is moderate-high fall risk.  She has history of diastolic heart failure is on lasix and weight is stable.   She does not workout. She denies chest pain, shortness of breath, dizziness. She does complain of right eye pain/HA yesterday, took 1 tylenol that helped, states feeling better today, no changes in vision.   She is on cholesterol medication and denies myalgias. Her cholesterol is at goal. The cholesterol last visit was:   Lab Results  Component Value Date   CHOL 141 08/27/2015   HDL 44* 08/27/2015   LDLCALC 75 08/27/2015   TRIG 111 08/27/2015   CHOLHDL 3.2 08/27/2015  She has been working on diet and exercise for Diabetes with diabetic chronic kidney disease, she is not on bASA, she is not on ACE/ARB, and denies polydipsia and polyuria.  Last A1C was:  Lab Results  Component Value Date   HGBA1C 6.8* 08/27/2015    Lab Results  Component Value Date   GFRNONAA 53* 08/27/2015    Patient is on Vitamin D supplement.   Lab Results  Component Value Date   VD25OH 53 08/27/2015     BMI is Body mass index is 22.76 kg/(m^2).  Medication Review Current Outpatient Prescriptions on File Prior to Visit  Medication Sig Dispense Refill  . acetaminophen (TYLENOL) 325 MG tablet Take 325-650 mg by mouth every 4 (four) hours as needed for mild pain, moderate pain or headache.     Marland Kitchen atorvastatin (LIPITOR) 40 MG tablet Take 1/2 tablet to 1 tablet daily for cholesterol. 90 tablet 1  . Cholecalciferol (VITAMIN D) 2000 UNITS CAPS Take 2,000 Units by mouth daily.     Marland Kitchen dicyclomine (BENTYL) 20 MG tablet Take 1 tablet 3 x day if needed for nausea, bloating , cramping or diarrhea 90 tablet 1  . furosemide (LASIX) 20 MG tablet Take 1 tablet (20 mg total) by mouth daily. 90 tablet 3  . Magnesium Hydroxide (MAGNESIA PO) Take 1 tablet by mouth daily.     . metoprolol tartrate (LOPRESSOR) 25 MG tablet Take 1 tablet (25 mg total) by mouth 2 (two) times daily. 180 tablet 3  . pantoprazole (PROTONIX) 40 MG tablet TAKE ONE TABLET BY MOUTH ONCE DAILY FOR  ACID  INDIGESTION 90 tablet 1  . potassium chloride (K-DUR) 10 MEQ tablet Take 1 tablet (10 mEq total) by mouth daily. 90 tablet 3  . ranitidine (ZANTAC) 300 MG tablet TAKE ONE TABLET BY MOUTH TWICE DAILY 180 tablet 1  . warfarin (COUMADIN) 2 MG tablet TAKE AS DIRECTED BY ANTICOAGULATION CLINIC. 35 tablet 3  . azithromycin (ZITHROMAX) 250 MG tablet Take 2 tablets (500 mg) on  Day 1,  followed by 1 tablet (250 mg) once daily on Days 2 through 5. (Patient not taking: Reported on 12/03/2015) 6 each 1   No current facility-administered medications on file prior to visit.    Allergies: Allergies  Allergen Reactions  . Ace Inhibitors     Cough  . Fosamax [Alendronate Sodium]     Heart burn  . Norvasc  [Amlodipine Besylate]     edema  . Zocor [Simvastatin]     Elevated CPK    Current Problems (verified) has T2_NIDDM w/ CKD2 (GFR 64 ml/min); Anxiety state; Essential hypertension; Atrial fibrillation (HCC); CVA ; APHASIA DUE TO CEREBROVASCULAR DISEASE; Hyperlipidemia; Vitamin D deficiency; Medication management; Diastolic CHF (HCC); GERD; Medicare annual wellness visit, subsequent; and Bursitis of hip on her problem list.  Screening Tests Immunization History  Administered Date(s) Administered  . DT 02/18/2015  . Influenza, High Dose Seasonal PF 03/05/2014, 02/18/2015  . Influenza-Unspecified 01/30/2013  . Pneumococcal-Unspecified 04/27/2004  . Td 06/28/2003  . Zoster 04/27/2009    Preventative care: Last colonoscopy: 2007, will not get another due to age Last mammogram: 03/2015 Last pap smear/pelvic exam: remote   DEXA:Jan 20 2005  Prior vaccinations: TD or Tdap: 2016  Influenza: 2016 Pneumococcal: 2005 Prevnar13: DUE, declines today Shingles/Zostavax: 2010  Names of Other Physician/Practitioners you currently use: 1. [facility]- here for primary care 2. Elmer Picker, eye doctor, last visit July 2017 3. Bentson, dentist, last visit May 2017 Patient Care  Team: [name], MD as PCP - General (Internal Medicine) Mateo FlowKathryn Hecker, MD as Consulting Physician (Ophthalmology) Lars MassonKatarina H Nelson, MD as Consulting Physician (Cardiology) Bernette Redbirdobert Buccini, MD as Consulting Physician (Gastroenterology) Cherlyn RobertsFrederick Lupton, MD as Consulting Physician (Dermatology) Richardean ChimeraJohn McComb, MD as Consulting Physician (Obstetrics and Gynecology)  Surgical: She  has past surgical history that includes Upper gastrointestinal endoscopy (2005); Colonoscopy (2005); Cholecystectomy; Cataract extraction, bilateral; and Tonsillectomy. Family Her family history includes Arthritis in her sister and sister; Diabetes in her mother; Heart disease in her brother; Hypertension in  her brother; Stroke in her mother. Social history  She reports that she has never smoked. She has never used smokeless tobacco. She reports that she does not drink alcohol or use illicit drugs.  MEDICARE WELLNESS OBJECTIVES: Physical activity: Current Exercise Habits: The patient does not participate in regular exercise at present Cardiac risk factors: Cardiac Risk Factors include: advanced age (>2955men, 33>65 women);diabetes mellitus;dyslipidemia;hypertension;sedentary lifestyle Depression/mood screen:   Depression screen [facility] 12/03/2015  Decreased Interest 0  Down, Depressed, Hopeless 0  PHQ - 2 Score 0    ADLs:  In your present state of health, do you have any difficulty performing the following activities: 12/03/2015 10/05/2015  Hearing? Malvin JohnsY Y  Vision? N N  Difficulty concentrating or making decisions? Malvin JohnsY Y  Walking or climbing stairs? N N  Dressing or bathing? N N  Doing errands, shopping? Malvin JohnsY Y  Preparing Food and eating ? N -  Using the Toilet? N -  In the past six months, have you accidently leaked urine? N -  Do you have problems with loss of bowel control? N -  Managing your Medications? N -  Managing your Finances? Y -  Housekeeping or managing your Housekeeping? N -    Cognitive Testing  Alert? Yes  Normal Appearance?Yes  Oriented to person? Yes  Place? Yes   Time? Yes  Recall of three objects?  1/3  Can perform simple calculations? Yes  Displays appropriate judgment?Yes  Can read the correct time from a watch face?Yes  EOL planning: Does patient have an advance directive?: Yes Type of Advance Directive: Healthcare Power of Attorney, Living will Does patient want to make changes to advanced directive?: No - Patient declined Copy of advanced directive(s) in chart?: No - copy requested   Objective:   Today's Vitals   12/03/15 1037  BP: 126/60  Pulse: 65  Temp: 97.9 F (36.6 C)  TempSrc: Temporal  Resp: 14  Height: 5' 1.5" (1.562 m)  Weight: 122 lb 6.4 oz (55.52  kg)  SpO2: 97%   Body mass index is 22.76 kg/(m^2).  General appearance: alert, no distress, WD/WN,  female HEENT: normocephalic, sclerae anicteric, TMs pearly, nares patent, no discharge or erythema, pharynx normal Oral cavity: MMM, no lesions Neck: supple, no lymphadenopathy, no thyromegaly, no masses Heart: Irreg irreg, normal S1, S2, no murmurs Lungs: CTA bilaterally, no wheezes, rhonchi, or rales Abdomen: +bs, soft, non tender, non distended, no masses, no hepatomegaly, no splenomegaly Musculoskeletal: nontender, no swelling, no obvious deformity Extremities: mild edema right leg, no warmth/redness/swelling, no cyanosis, no clubbing Pulses: 2+ symmetric, upper and lower extremities, normal cap refill Neurological: alert, oriented x 3, CN2-12 intact, strength normal upper extremities and lower extremities, sensation decreased bilateral feet to mid shin DTRs 2+ throughout, no cerebellar signs, gait normal Psychiatric: normal affect, behavior normal, anomia, pleasant  Breast: defer Gyn: defer Rectal: defer  Diabetic Foot Exam - Simple   Simple Foot Form  Diabetic Foot exam was  performed with the following findings:  Yes 12/03/2015 10:59 AM  Visual Inspection  See comments:  Yes  Sensation Testing  See comments:  Yes  Pulse Check  Posterior Tibialis and Dorsalis pulse intact bilaterally:  Yes  Comments  normal DP and PT pulses, no trophic changes or ulcerative lesions, reduced sensation at bilateral feet to mid shin and bunions bilateral feet       Medicare Attestation I have personally reviewed: The patient's medical and social history Their use of alcohol, tobacco or illicit drugs Their current medications and supplements The patient's functional ability including ADLs,fall risks, home safety risks, cognitive, and hearing and visual impairment Diet and physical activities Evidence for depression or mood disorders  The patient's weight, height, BMI, and visual acuity have  been recorded in the chart.  I have made referrals, counseling, and provided education to the patient based on review of the above and I have provided the patient with a written personalized care plan for preventive services.     Quentin Mulling, PA-C   12/03/2015

## 2015-12-03 NOTE — Patient Instructions (Signed)
Melatonin is okay to take at night 30 mins before bed 5-10 mg  Pneumococcal Vaccine, Polyvalent suspension for injection What is this medicine? PNEUMOCOCCAL VACCINE (NEU mo KOK al vak SEEN) is a vaccine used to prevent pneumococcus bacterial infections. These bacteria can cause serious infections like pneumonia, meningitis, and blood infections. This vaccine will lower your chance of getting pneumonia. If you do get pneumonia, it can make your symptoms milder and your illness shorter. This vaccine will not treat an infection and will not cause infection. This vaccine is recommended for infants and young children, adults with certain medical conditions, and adults 65 years or older. This medicine may be used for other purposes; ask your health care provider or pharmacist if you have questions. What should I tell my health care provider before I take this medicine? They need to know if you have any of these conditions: -bleeding problems -fever -immune system problems -an unusual or allergic reaction to pneumococcal vaccine, diphtheria toxoid, other vaccines, latex, other medicines, foods, dyes, or preservatives -pregnant or trying to get pregnant -breast-feeding How should I use this medicine? This vaccine is for injection into a muscle. It is given by a health care professional. A copy of Vaccine Information Statements will be given before each vaccination. Read this sheet carefully each time. The sheet may change frequently. Talk to your pediatrician regarding the use of this medicine in children. While this drug may be prescribed for children as young as 496 weeks old for selected conditions, precautions do apply. Overdosage: If you think you have taken too much of this medicine contact a poison control center or emergency room at once. NOTE: This medicine is only for you. Do not share this medicine with others. What if I miss a dose? It is important not to miss your dose. Call your doctor or  health care professional if you are unable to keep an appointment. What may interact with this medicine? -medicines for cancer chemotherapy -medicines that suppress your immune function -steroid medicines like prednisone or cortisone This list may not describe all possible interactions. Give your health care provider a list of all the medicines, herbs, non-prescription drugs, or dietary supplements you use. Also tell them if you smoke, drink alcohol, or use illegal drugs. Some items may interact with your medicine. What should I watch for while using this medicine? Mild fever and pain should go away in 3 days or less. Report any unusual symptoms to your doctor or health care professional. What side effects may I notice from receiving this medicine? Side effects that you should report to your doctor or health care professional as soon as possible: -allergic reactions like skin rash, itching or hives, swelling of the face, lips, or tongue -breathing problems -confused -fast or irregular heartbeat -fever over 102 degrees F -seizures -unusual bleeding or bruising -unusual muscle weakness Side effects that usually do not require medical attention (report to your doctor or health care professional if they continue or are bothersome): -aches and pains -diarrhea -fever of 102 degrees F or less -headache -irritable -loss of appetite -pain, tender at site where injected -trouble sleeping This list may not describe all possible side effects. Call your doctor for medical advice about side effects. You may report side effects to FDA at 1-800-FDA-1088. Where should I keep my medicine? This does not apply. This vaccine is given in a clinic, pharmacy, doctor's office, or other health care setting and will not be stored at home. NOTE: This sheet is a  summary. It may not cover all possible information. If you have questions about this medicine, talk to your doctor, pharmacist, or health care provider.     2016, Elsevier/Gold Standard. (2014-02-19 10:27:27)

## 2015-12-04 LAB — BASIC METABOLIC PANEL WITH GFR
BUN: 13 mg/dL (ref 7–25)
CO2: 26 mmol/L (ref 20–31)
Calcium: 9.5 mg/dL (ref 8.6–10.4)
Chloride: 104 mmol/L (ref 98–110)
Creat: 1.08 mg/dL — ABNORMAL HIGH (ref 0.60–0.88)
GFR, EST AFRICAN AMERICAN: 53 mL/min — AB (ref >=60)
GFR, EST NON AFRICAN AMERICAN: 46 mL/min — AB (ref >=60)
Glucose, Bld: 145 mg/dL — ABNORMAL HIGH (ref 65–99)
POTASSIUM: 3.9 mmol/L (ref 3.5–5.3)
Sodium: 140 mmol/L (ref 135–146)

## 2015-12-04 LAB — LIPID PANEL
CHOL/HDL RATIO: 3.2 ratio (ref <=5.0)
Cholesterol: 138 mg/dL (ref 125–200)
HDL: 43 mg/dL — AB (ref >=46)
LDL CALC: 71 mg/dL (ref <130)
TRIGLYCERIDES: 119 mg/dL (ref <150)
VLDL: 24 mg/dL (ref <30)

## 2015-12-04 LAB — HEPATIC FUNCTION PANEL
ALK PHOS: 110 U/L (ref 33–130)
ALT: 16 U/L (ref 6–29)
AST: 20 U/L (ref 10–35)
Albumin: 4 g/dL (ref 3.6–5.1)
BILIRUBIN DIRECT: 0.2 mg/dL (ref <=0.2)
BILIRUBIN INDIRECT: 0.6 mg/dL (ref 0.2–1.2)
BILIRUBIN TOTAL: 0.8 mg/dL (ref 0.2–1.2)
Total Protein: 6.5 g/dL (ref 6.1–8.1)

## 2015-12-04 LAB — MAGNESIUM: Magnesium: 1.8 mg/dL (ref 1.5–2.5)

## 2015-12-05 ENCOUNTER — Other Ambulatory Visit: Payer: Self-pay | Admitting: Cardiology

## 2015-12-30 ENCOUNTER — Ambulatory Visit (INDEPENDENT_AMBULATORY_CARE_PROVIDER_SITE_OTHER): Payer: Medicare Other | Admitting: *Deleted

## 2015-12-30 DIAGNOSIS — I6310 Cerebral infarction due to embolism of unspecified precerebral artery: Secondary | ICD-10-CM | POA: Diagnosis not present

## 2015-12-30 DIAGNOSIS — Z79899 Other long term (current) drug therapy: Secondary | ICD-10-CM

## 2015-12-30 DIAGNOSIS — I4891 Unspecified atrial fibrillation: Secondary | ICD-10-CM

## 2015-12-30 LAB — POCT INR: INR: 2.1

## 2016-01-09 ENCOUNTER — Other Ambulatory Visit: Payer: Self-pay | Admitting: Cardiology

## 2016-01-09 DIAGNOSIS — I5032 Chronic diastolic (congestive) heart failure: Secondary | ICD-10-CM

## 2016-01-09 DIAGNOSIS — I10 Essential (primary) hypertension: Secondary | ICD-10-CM

## 2016-01-11 ENCOUNTER — Other Ambulatory Visit: Payer: Self-pay | Admitting: Cardiology

## 2016-01-27 ENCOUNTER — Ambulatory Visit (INDEPENDENT_AMBULATORY_CARE_PROVIDER_SITE_OTHER): Payer: Medicare Other | Admitting: Pharmacist

## 2016-01-27 DIAGNOSIS — Z79899 Other long term (current) drug therapy: Secondary | ICD-10-CM

## 2016-01-27 DIAGNOSIS — I4891 Unspecified atrial fibrillation: Secondary | ICD-10-CM | POA: Diagnosis not present

## 2016-01-27 LAB — POCT INR: INR: 2.6

## 2016-02-24 ENCOUNTER — Other Ambulatory Visit: Payer: Self-pay | Admitting: Internal Medicine

## 2016-02-24 ENCOUNTER — Ambulatory Visit (INDEPENDENT_AMBULATORY_CARE_PROVIDER_SITE_OTHER): Payer: Medicare Other | Admitting: *Deleted

## 2016-02-24 DIAGNOSIS — I4891 Unspecified atrial fibrillation: Secondary | ICD-10-CM | POA: Diagnosis not present

## 2016-02-24 DIAGNOSIS — Z79899 Other long term (current) drug therapy: Secondary | ICD-10-CM

## 2016-02-24 LAB — POCT INR: INR: 1.8

## 2016-03-06 ENCOUNTER — Other Ambulatory Visit: Payer: Self-pay | Admitting: Cardiology

## 2016-03-08 ENCOUNTER — Other Ambulatory Visit: Payer: Self-pay | Admitting: Cardiology

## 2016-03-16 ENCOUNTER — Ambulatory Visit (INDEPENDENT_AMBULATORY_CARE_PROVIDER_SITE_OTHER): Payer: Medicare Other | Admitting: Internal Medicine

## 2016-03-16 ENCOUNTER — Encounter: Payer: Self-pay | Admitting: Internal Medicine

## 2016-03-16 VITALS — BP 162/86 | HR 52 | Temp 97.2°F | Resp 16 | Ht 61.5 in | Wt 126.6 lb

## 2016-03-16 DIAGNOSIS — E1122 Type 2 diabetes mellitus with diabetic chronic kidney disease: Secondary | ICD-10-CM | POA: Diagnosis not present

## 2016-03-16 DIAGNOSIS — Z136 Encounter for screening for cardiovascular disorders: Secondary | ICD-10-CM | POA: Diagnosis not present

## 2016-03-16 DIAGNOSIS — N182 Chronic kidney disease, stage 2 (mild): Secondary | ICD-10-CM | POA: Diagnosis not present

## 2016-03-16 DIAGNOSIS — Z23 Encounter for immunization: Secondary | ICD-10-CM

## 2016-03-16 DIAGNOSIS — E559 Vitamin D deficiency, unspecified: Secondary | ICD-10-CM | POA: Diagnosis not present

## 2016-03-16 DIAGNOSIS — Z1212 Encounter for screening for malignant neoplasm of rectum: Secondary | ICD-10-CM

## 2016-03-16 DIAGNOSIS — E782 Mixed hyperlipidemia: Secondary | ICD-10-CM

## 2016-03-16 DIAGNOSIS — Z79899 Other long term (current) drug therapy: Secondary | ICD-10-CM

## 2016-03-16 DIAGNOSIS — K219 Gastro-esophageal reflux disease without esophagitis: Secondary | ICD-10-CM

## 2016-03-16 DIAGNOSIS — I10 Essential (primary) hypertension: Secondary | ICD-10-CM

## 2016-03-16 DIAGNOSIS — I4819 Other persistent atrial fibrillation: Secondary | ICD-10-CM

## 2016-03-16 LAB — HEPATIC FUNCTION PANEL
ALT: 17 U/L (ref 6–29)
AST: 22 U/L (ref 10–35)
Albumin: 4.1 g/dL (ref 3.6–5.1)
Alkaline Phosphatase: 121 U/L (ref 33–130)
BILIRUBIN INDIRECT: 0.6 mg/dL (ref 0.2–1.2)
Bilirubin, Direct: 0.2 mg/dL (ref <=0.2)
TOTAL PROTEIN: 6.7 g/dL (ref 6.1–8.1)
Total Bilirubin: 0.8 mg/dL (ref 0.2–1.2)

## 2016-03-16 LAB — BASIC METABOLIC PANEL WITH GFR
BUN: 16 mg/dL (ref 7–25)
CALCIUM: 9.6 mg/dL (ref 8.6–10.4)
CO2: 26 mmol/L (ref 20–31)
CREATININE: 1 mg/dL — AB (ref 0.60–0.88)
Chloride: 105 mmol/L (ref 98–110)
GFR, EST AFRICAN AMERICAN: 58 mL/min — AB (ref >=60)
GFR, EST NON AFRICAN AMERICAN: 50 mL/min — AB (ref >=60)
GLUCOSE: 164 mg/dL — AB (ref 65–99)
Potassium: 4 mmol/L (ref 3.5–5.3)
SODIUM: 141 mmol/L (ref 135–146)

## 2016-03-16 LAB — LIPID PANEL
CHOLESTEROL: 137 mg/dL (ref 125–200)
HDL: 46 mg/dL (ref >=46)
LDL Cholesterol: 63 mg/dL (ref <130)
TRIGLYCERIDES: 141 mg/dL (ref <150)
Total CHOL/HDL Ratio: 3 Ratio (ref <=5.0)
VLDL: 28 mg/dL (ref <30)

## 2016-03-16 LAB — MAGNESIUM: MAGNESIUM: 2 mg/dL (ref 1.5–2.5)

## 2016-03-16 LAB — CBC WITH DIFFERENTIAL/PLATELET
BASOS ABS: 63 {cells}/uL (ref 0–200)
Basophils Relative: 1 %
EOS PCT: 3 %
Eosinophils Absolute: 189 cells/uL (ref 15–500)
HEMATOCRIT: 46.5 % — AB (ref 35.0–45.0)
HEMOGLOBIN: 15.8 g/dL — AB (ref 11.7–15.5)
LYMPHS PCT: 44 %
Lymphs Abs: 2772 cells/uL (ref 850–3900)
MCH: 31.8 pg (ref 27.0–33.0)
MCHC: 34 g/dL (ref 32.0–36.0)
MCV: 93.6 fL (ref 80.0–100.0)
MPV: 10.3 fL (ref 7.5–12.5)
Monocytes Absolute: 441 cells/uL (ref 200–950)
Monocytes Relative: 7 %
NEUTROS PCT: 45 %
Neutro Abs: 2835 cells/uL (ref 1500–7800)
Platelets: 202 10*3/uL (ref 140–400)
RBC: 4.97 MIL/uL (ref 3.80–5.10)
RDW: 13.2 % (ref 11.0–15.0)
WBC: 6.3 10*3/uL (ref 3.8–10.8)

## 2016-03-16 LAB — TSH: TSH: 1.37 mIU/L

## 2016-03-16 NOTE — Patient Instructions (Signed)

## 2016-03-16 NOTE — Progress Notes (Signed)
[facility] Lisa CowboyWilliam Yarelin Hopkins, M.D.    Lisa CarrelAmanda R. Steffanie Hopkins, P.A.-C      Lisa Hopkins, P.A.-C  [facility]                [address]                [place], [place] [id number] Telephone [phone] Telefax [phone]   Comprehensive Evaluation &  Examination     This very nice 82 y.o. MWF presents for a  comprehensive evaluation and management of multiple medical co-morbidities.  Patient has been followed for HTN, T2_NIDDM, Hyperlipidemia and Vitamin D Deficiency. Patient also has GERD & IBS generally controlled with diet.      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  WRT her CVA, she has persistent dysnomia and poor ST recall.  She does use a walker to stabilize ger gait & balance. Patient's BP has been controlled at home and patient denies any cardiac symptoms as chest pain, palpitations, shortness of breath, dizziness or ankle swelling. Today's BP is 162/86.       Patient's hyperlipidemia is controlled with diet and medications. Patient denies myalgias or other medication SE's. Last lipids were at goal: Lab Results  Component Value Date   CHOL 138 12/03/2015   HDL 43 (L) 12/03/2015   LDLCALC 71 12/03/2015   TRIG 119 12/03/2015   CHOLHDL 3.2 12/03/2015      Patient has T2_NIDDM/CKD2 predating circa 2003 and has not well controlled her A1c's  and patient denies reactive hypoglycemic symptoms, visual blurring, diabetic polys, or paresthesias. Last A1c was not at goal: Lab Results  Component Value Date   HGBA1C 6.7 (H) 12/03/2015      Finally, patient has history of Vitamin D Deficiency and last Vitamin D was still low: Lab Results  Component Value Date   VD25OH 8453 08/27/2015   Current Outpatient Prescriptions on File Prior to Visit  Medication Sig  . acetaminophen  (TYLENOL) 325 MG tablet Take 325-650 mg by mouth every 4 (four) hours as needed for mild pain, moderate pain or headache.   Marland Kitchen. atorvastatin (LIPITOR) 40 MG tablet Take 1/2 tablet to 1 tablet daily for cholesterol.  . Cholecalciferol (VITAMIN D) 2000 UNITS CAPS Take 2,000 Units by mouth daily.   Marland Kitchen. dicyclomine (BENTYL) 20 MG tablet Take 1 tablet 3 x day if needed for nausea, bloating , cramping or diarrhea  . furosemide (LASIX) 20 MG tablet TAKE ONE TABLET BY MOUTH ONCE DAILY  . Magnesium Hydroxide (MAGNESIA PO) Take 1 tablet by mouth daily.   . metoprolol tartrate (LOPRESSOR) 25 MG tablet Take 1 tablet (25 mg total) by mouth 2 (two) times daily.  . pantoprazole (PROTONIX) 40 MG tablet TAKE ONE TABLET BY MOUTH ONCE DAILY FOR  ACID  INDIGESTION  . potassium chloride (K-DUR) 10 MEQ tablet TAKE ONE TABLET BY MOUTH ONCE DAILY  . ranitidine (ZANTAC) 300 MG tablet TAKE ONE TABLET BY MOUTH TWICE DAILY  . warfarin (COUMADIN) 2 MG tablet TAKE AS DIRECTED BY ANTICOAGULATION CLINIC   No current facility-administered medications on file prior to visit.    Allergies  Allergen Reactions  . Ace Inhibitors     Cough  . Fosamax [Alendronate Sodium]     Heart burn  . Norvasc [Amlodipine Besylate]     edema  .  Zocor [Simvastatin]     Elevated CPK   Past Medical History:  Diagnosis Date  . Anxiety   . Aphasia as late effect of cerebrovascular accident   . Atrial fibrillation (HCC)   . CVA (cerebral infarction)   . Diabetes mellitus type II   . GERD (gastroesophageal reflux disease)   . Hiatal hernia   . HTN (hypertension)   . Hyperlipidemia   . IBS (irritable bowel syndrome)   . Stroke (HCC)   . Vitamin D deficiency    Health Maintenance  Topic Date Due  . PNA vac Low Risk Adult (2 of 2 - PCV13) 04/27/2005  . INFLUENZA VACCINE  12/28/2015  . URINE MICROALBUMIN  02/18/2016  . HEMOGLOBIN A1C  06/04/2016  . OPHTHALMOLOGY EXAM  11/29/2016  . FOOT EXAM  12/02/2016  . TETANUS/TDAP  02/17/2025  .  DEXA SCAN  Completed  . ZOSTAVAX  Completed   Immunization History  Administered Date(s) Administered  . DT 02/18/2015  . Influenza, High Dose Seasonal PF 03/05/2014, 02/18/2015  . Influenza-Unspecified 01/30/2013  . Pneumococcal-Unspecified 04/27/2004  . Td 06/28/2003  . Zoster 04/27/2009   Past Surgical History:  Procedure Laterality Date  . CATARACT EXTRACTION, BILATERAL    . CHOLECYSTECTOMY    . COLONOSCOPY  2005  . TONSILLECTOMY    . UPPER GASTROINTESTINAL ENDOSCOPY  2005   Family History  Problem Relation Age of Onset  . Diabetes Mother   . Stroke Mother   . Hypertension Brother   . Heart disease Brother   . Arthritis Sister     Rhematoid  . Arthritis Sister     Rhematoid   Social History  Substance Use Topics  . Smoking status: Never Smoker  . Smokeless tobacco: Never Used  . Alcohol use No    ROS Constitutional: Denies fever, chills, weight loss/gain, headaches, insomnia,  night sweats, and change in appetite. Does c/o fatigue. Eyes: Denies redness, blurred vision, diplopia, discharge, itchy, watery eyes.  ENT: Denies discharge, congestion, post nasal drip, epistaxis, sore throat, earache, hearing loss, dental pain, Tinnitus, Vertigo, Sinus pain, snoring.  Cardio: Denies chest pain, palpitations, irregular heartbeat, syncope, dyspnea, diaphoresis, orthopnea, PND, claudication, edema Respiratory: denies cough, dyspnea, DOE, pleurisy, hoarseness, laryngitis, wheezing.  Gastrointestinal: Denies dysphagia, heartburn, reflux, water brash, pain, cramps, nausea, vomiting, bloating, diarrhea, constipation, hematemesis, melena, hematochezia, jaundice, hemorrhoids Genitourinary: Denies dysuria, frequency, urgency, nocturia, hesitancy, discharge, hematuria, flank pain Breast: Breast lumps, nipple discharge, bleeding.  Musculoskeletal: Denies arthralgia, myalgia, stiffness, Jt. Swelling, pain, limp, and strain/sprain. Denies falls. Skin: Denies puritis, rash, hives, warts,  acne, eczema, changing in skin lesion Neuro: No weakness, tremor, incoordination, spasms, paresthesia, pain Psychiatric: Denies confusion, memory loss, sensory loss. Denies Depression. Endocrine: Denies change in weight, skin, hair change, nocturia, and paresthesia, diabetic polys, visual blurring, hyper / hypo glycemic episodes.  Heme/Lymph: No excessive bleeding, bruising, enlarged lymph nodes.  Physical Exam  BP162/86 - reck'd at 142/82  P  52   T 97.2 F   R 16  Ht 5' 1.5"  Wt 126 lb 9.6 oz  BMI 23.53   General Appearance: Well nourished and in no apparent distress.  Eyes: PERRLA, EOMs, conjunctiva no swelling or erythema, normal fundi and vessels. Sinuses: No frontal/maxillary tenderness ENT/Mouth: EACs patent / TMs  nl. Nares clear without erythema, swelling, mucoid exudates. Oral hygiene is good. No erythema, swelling, or exudate. Tongue normal, non-obstructing. Tonsils not swollen or erythematous. Hearing normal.  Neck: Supple, thyroid normal. No bruits, nodes or JVD. Respiratory: Respiratory  effort normal.  BS equal and clear bilateral without rales, rhonci, wheezing or stridor. Cardio: Heart sounds are distant with irregular rate and rhythm and no murmurs, rubs or gallops. Peripheral pulses are normal and equal bilaterally without edema. No aortic or femoral bruits. Chest: symmetric with normal excursions and percussion. Breasts: Atrophic without lumps, nipple discharge, retractions, or fibrocystic changes.  Abdomen: Flat, soft with bowel sounds active. Nontender, no guarding, rebound, hernias, masses, or organomegaly.  Lymphatics: Non tender without lymphadenopathy.  Musculoskeletal: Full ROM all peripheral extremities, joint stability, 5/5 strength, and normal gait. Skin: Warm and dry without rashes, lesions, cyanosis, clubbing or  ecchymosis.  Neuro: Cranial nerves intact, reflexes equal bilaterally. Normal muscle tone, no cerebellar symptoms. Sensation intact to touch,  vibratory and Monofilament to the toes bilaterally.. Mild to moderate dysnomia. Pysch:  Affect flat. Poor short term recall.   Assessment and Plan  1. Essential hypertension  - Microalbumin / creatinine urine ratio - EKG 12-Lead - TSH  2. Mixed hyperlipidemia  - Lipid panel - TSH  3. Type 2 diabetes mellitus with stage 2 chronic kidney disease, without long-term current use of insulin (HCC)  - Hemoglobin A1c - Insulin, random - HM DIABETES FOOT EXAM - LOW EXTREMITY NEUR EXAM DOCUM  4. Vitamin D deficiency  - VITAMIN D 25 Hydroxy  5. Persistent atrial fibrillation (HCC)   6. Gastroesophageal reflux disease   7. Screening for rectal cancer   8. Screening for ischemic heart disease   9. Medication management  - Urinalysis, Routine w reflex microscopic  - CBC with Differential/Platelet - BASIC METABOLIC PANEL WITH GFR - Hepatic function panel - Magnesium  10. Need for prophylactic vaccination and inoculation against influenza  - Flu vaccine HIGH DOSE PF (Fluzone High dose)       Continue prudent diet as discussed, weight control, BP monitoring, regular exercise, and medications. Discussed med's effects and SE's. Screening labs and tests as requested with regular follow-up as recommended. Over 40 minutes of exam, counseling, chart review and high complex critical decision making was performed.

## 2016-03-17 LAB — URINALYSIS, ROUTINE W REFLEX MICROSCOPIC
BILIRUBIN URINE: NEGATIVE
GLUCOSE, UA: NEGATIVE
Hgb urine dipstick: NEGATIVE
KETONES UR: NEGATIVE
Leukocytes, UA: NEGATIVE
Nitrite: NEGATIVE
PROTEIN: NEGATIVE
Specific Gravity, Urine: 1.016 (ref 1.001–1.035)
pH: 5 (ref 5.0–8.0)

## 2016-03-17 LAB — HEMOGLOBIN A1C
Hgb A1c MFr Bld: 6.5 % — ABNORMAL HIGH (ref <5.7)
Mean Plasma Glucose: 140 mg/dL

## 2016-03-17 LAB — MICROALBUMIN / CREATININE URINE RATIO
CREATININE, URINE: 108 mg/dL (ref 20–320)
Microalb Creat Ratio: 7 mcg/mg creat (ref <30)
Microalb, Ur: 0.8 mg/dL

## 2016-03-17 LAB — VITAMIN D 25 HYDROXY (VIT D DEFICIENCY, FRACTURES): Vit D, 25-Hydroxy: 53 ng/mL (ref 30–100)

## 2016-03-17 LAB — INSULIN, RANDOM: Insulin: 20.5 u[IU]/mL — ABNORMAL HIGH (ref 2.0–19.6)

## 2016-03-28 ENCOUNTER — Ambulatory Visit (INDEPENDENT_AMBULATORY_CARE_PROVIDER_SITE_OTHER): Payer: Medicare Other | Admitting: *Deleted

## 2016-03-28 DIAGNOSIS — I4891 Unspecified atrial fibrillation: Secondary | ICD-10-CM | POA: Diagnosis not present

## 2016-03-28 DIAGNOSIS — Z79899 Other long term (current) drug therapy: Secondary | ICD-10-CM

## 2016-03-28 LAB — POCT INR: INR: 2.3

## 2016-04-03 ENCOUNTER — Other Ambulatory Visit: Payer: Self-pay | Admitting: Cardiology

## 2016-04-03 DIAGNOSIS — I5032 Chronic diastolic (congestive) heart failure: Secondary | ICD-10-CM

## 2016-04-03 DIAGNOSIS — I10 Essential (primary) hypertension: Secondary | ICD-10-CM

## 2016-04-07 ENCOUNTER — Other Ambulatory Visit: Payer: Self-pay | Admitting: *Deleted

## 2016-04-07 DIAGNOSIS — Z1212 Encounter for screening for malignant neoplasm of rectum: Secondary | ICD-10-CM

## 2016-04-07 LAB — POC HEMOCCULT BLD/STL (HOME/3-CARD/SCREEN)
FECAL OCCULT BLD: NEGATIVE
FECAL OCCULT BLD: NEGATIVE
FECAL OCCULT BLD: NEGATIVE

## 2016-04-19 ENCOUNTER — Encounter: Payer: Self-pay | Admitting: Cardiology

## 2016-04-24 ENCOUNTER — Telehealth: Payer: Self-pay | Admitting: *Deleted

## 2016-04-24 NOTE — Telephone Encounter (Signed)
Spouse called and reported patient will run out of her Pantoprazole 40 mg a week early.  Per Dr Oneta RackMcKeown, she can continue her Ranitidine 300 mg BID and try OTC Omeprazole 20 mg tablets 2 per day for her GERD, until the RX can be filled.

## 2016-04-28 ENCOUNTER — Ambulatory Visit (INDEPENDENT_AMBULATORY_CARE_PROVIDER_SITE_OTHER): Payer: Medicare Other | Admitting: Cardiology

## 2016-04-28 ENCOUNTER — Ambulatory Visit (INDEPENDENT_AMBULATORY_CARE_PROVIDER_SITE_OTHER): Payer: Medicare Other | Admitting: *Deleted

## 2016-04-28 VITALS — BP 126/86 | HR 59 | Ht 61.5 in | Wt 126.0 lb

## 2016-04-28 DIAGNOSIS — I5032 Chronic diastolic (congestive) heart failure: Secondary | ICD-10-CM

## 2016-04-28 DIAGNOSIS — Z7901 Long term (current) use of anticoagulants: Secondary | ICD-10-CM

## 2016-04-28 DIAGNOSIS — I4891 Unspecified atrial fibrillation: Secondary | ICD-10-CM | POA: Diagnosis not present

## 2016-04-28 DIAGNOSIS — Z79899 Other long term (current) drug therapy: Secondary | ICD-10-CM

## 2016-04-28 DIAGNOSIS — E782 Mixed hyperlipidemia: Secondary | ICD-10-CM

## 2016-04-28 DIAGNOSIS — I10 Essential (primary) hypertension: Secondary | ICD-10-CM

## 2016-04-28 LAB — POCT INR: INR: 2.7

## 2016-04-28 MED ORDER — METOPROLOL TARTRATE 25 MG PO TABS: 12.5000 mg | ORAL_TABLET | Freq: Two times a day (BID) | ORAL | 3 refills | Status: DC

## 2016-04-28 NOTE — Patient Instructions (Signed)
Medication Instructions:   STOP TAKING ATORVASTATIN NOW  DECREASE YOUR METOPROLOL TARTRATE TO 12.5 MG TWICE DAILY     Follow-Up:  Your physician wants you to follow-up in: 6 MONTHS WITH DR Johnell ComingsNELSON You will receive a reminder letter in the mail two months in advance. If you don't receive a letter, please call our office to schedule the follow-up appointment.      If you need a refill on your cardiac medications before your next appointment, please call your pharmacy.

## 2016-04-28 NOTE — Progress Notes (Signed)
Patient ID: ISA MERKT, female   DOB: 09/19/1924, 82 y.o.   MRN: [id number]    Patient Name: Lisa Hopkins Date of Encounter: 04/28/2016  Primary Care Provider:  Nadean Corwin, MD Primary Cardiologist:  Tobias Alexander  Problem List   Past Medical History:  Diagnosis Date  . Anxiety   . Aphasia as late effect of cerebrovascular accident   . Atrial fibrillation (HCC)   . CVA (cerebral infarction)   . Diabetes mellitus type II   . GERD (gastroesophageal reflux disease)   . Hiatal hernia   . HTN (hypertension)   . Hyperlipidemia   . IBS (irritable bowel syndrome)   . Stroke (HCC)   . Vitamin D deficiency    Past Surgical History:  Procedure Laterality Date  . CATARACT EXTRACTION, BILATERAL    . CHOLECYSTECTOMY    . COLONOSCOPY  2005  . TONSILLECTOMY    . UPPER GASTROINTESTINAL ENDOSCOPY  2005   Allergies  Allergies  Allergen Reactions  . Ace Inhibitors     Cough  . Fosamax [Alendronate Sodium]     Heart burn  . Norvasc [Amlodipine Besylate]     edema  . Zocor [Simvastatin]     Elevated CPK   CC: LE edema  HPI  Lisa Hopkins is a pleasant 82 year old female with h/o chronic A. Fib and anticoagulation post CVA in 2010. She has been stable since then, no recurrent stroke, no DOE, CP, LE edema or DOE or palpitations.  She very compliant with her medications. She is complaining of lower abdominal pain that progresses to epigastric pain and retrosternal burning. She used to experience it daily after dinner, but recently it has become less frequent. She denies change in apettite, diarhea, bloody or black tarry stools.  She has h/o GERD and acute ischemic colitis 2 years ago, it seems that she didn't follow with a gastroenterologist since then.  06/24/2014 - the patient had an episode of colitis 1 week ago, for which she received metronidazole and ciprofloxacine. She has noticed LE edema since she started it. IT was discontinued. She has noticed worsening  SOB, no chest pain, no palpitations. Two falls in the last year, both mechanical. Complaint with her meds.   04/28/2016 - this is one year follow-up, the patient states that she has been doing well, she is more forgetful now, she denies any chest pain or shortness of breath, she feels dizzy when she stands up especially in the morning but hasn't had any falls. She denies any orthopnea or personal nocturnal dyspnea, but complains of mild lower extremity edema that is slightly painful. Her atorvastatin was recently discontinued. No bleeding with Warfarin.   Home Medications  Prior to Admission medications   Medication Sig Start Date End Date Taking? Authorizing Provider  acetaminophen (TYLENOL) 325 MG tablet Take 650 mg by mouth every 4 (four) hours as needed.     Yes Historical Provider, MD  alum hydroxide-mag trisilicate (ANTACID) 80-20 MG CHEW Chew 1 tablet by mouth 3 (three) times daily as needed.   Yes Historical Provider, MD  Cholecalciferol (VITAMIN D) 2000 UNITS CAPS Take 2 capsules by mouth daily.    Yes Historical Provider, MD  hydrochlorothiazide (HYDRODIURIL) 25 MG tablet TAKE ONE TABLET BY MOUTH IN THE MORNING FOR BLOOD PRESSURE AND FLUID 05/30/13  Yes Lucky Cowboy, MD  HYDROcodone-acetaminophen (NORCO/VICODIN) 5-325 MG per tablet Take 1-2 tablets by mouth every 4 (four) hours as needed. 03/17/12  Yes Dorothea Ogle, MD  metoprolol (  LOPRESSOR) 50 MG tablet Take 50 mg by mouth 2 (two) times daily.     Yes Historical Provider, MD  pantoprazole (PROTONIX) 40 MG tablet Take 1 tablet (40 mg total) by mouth daily. 03/17/12  Yes Dorothea Ogle, MD  polyethylene glycol (MIRALAX / Ethelene Hal) packet Take 17 g by mouth daily. 03/17/12  Yes Dorothea Ogle, MD  rosuvastatin (CRESTOR) 20 MG tablet Take 1 tablet (20 mg total) by mouth every evening. 05/26/13  Yes Quentin Mulling, PA-C  warfarin (COUMADIN) 2 MG tablet Take as directed by anticoagulation clinic 12/24/12  Yes Gaylord Shih, MD   Family  History  Family History  Problem Relation Age of Onset  . Diabetes Mother   . Stroke Mother   . Hypertension Brother   . Heart disease Brother   . Arthritis Sister     Rhematoid  . Arthritis Sister     Rhematoid   Social History  Social History   Social History  . Marital status: Married    Spouse name: N/A  . Number of children: N/A  . Years of education: N/A   Occupational History  . Not on file.   Social History Main Topics  . Smoking status: Never Smoker  . Smokeless tobacco: Never Used  . Alcohol use No  . Drug use: No  . Sexual activity: No   Other Topics Concern  . Not on file   Social History Narrative  . No narrative on file    Review of Systems, as per HPI, otherwise negative General:  No chills, fever, night sweats or weight changes.  Cardiovascular:  No chest pain, dyspnea on exertion, edema, orthopnea, palpitations, paroxysmal nocturnal dyspnea. Dermatological: No rash, lesions/masses Respiratory: No cough, dyspnea Urologic: No hematuria, dysuria Abdominal:   No nausea, vomiting, diarrhea, bright red blood per rectum, melena, or hematemesis Neurologic:  No visual changes, wkns, changes in mental status. All other systems reviewed and are otherwise negative except as noted above.  Physical Exam  Blood pressure 126/86, pulse (!) 59, height 5' 1.5" (1.562 m), weight 126 lb (57.2 kg).  General: Pleasant, NAD Psych: Normal affect. Neuro: Alert and oriented X 3. Moves all extremities spontaneously. HEENT: Normal  Neck: Supple without bruits or JVD. Lungs:  Resp regular and unlabored, CTA. Heart: RRR no s3, s4, or murmurs. Abdomen: Soft, non-tender, non-distended, BS + x 4.  Extremities: No clubbing, cyanosis , trivial peri-malleolar edema, . DP/PT/Radials 2+ and equal bilaterally.  Labs:  No results for input(s): CKTOTAL, CKMB, TROPONINI in the last 72 hours. Lab Results  Component Value Date   WBC 6.3 03/16/2016   HGB 15.8 (H) 03/16/2016    HCT 46.5 (H) 03/16/2016   MCV 93.6 03/16/2016   PLT 202 03/16/2016    No results for input(s): NA, K, CL, CO2, BUN, CREATININE, CALCIUM, PROT, BILITOT, ALKPHOS, ALT, AST, GLUCOSE in the last 168 hours.  Invalid input(s): LABALBU Lab Results  Component Value Date   CHOL 137 03/16/2016   HDL 46 03/16/2016   LDLCALC 63 03/16/2016   TRIG 141 03/16/2016   No results found for: DDIMER Invalid input(s): POCBNP  Accessory Clinical Findings  ECG - Atrial fibrillation, non-specific ST-T wave changes  Procedures/Studies:  Ct Abdomen Pelvis W Contrast  03/12/2012  IMPRESSION:  1. Marked wall thickening and mucosal enhancement in the descending and sigmoid colon, compatible with severe colitis. Surrounding stranding and mild ascites. The celiac trunk and mesenteric arteries appear patent, and no intramural gas is observed in the  colon. This colitis is probably infectious rather than ischemic, and if the patient has been on recent antibiotics then checking for C difficile toxins may be warranted. I doubt that the appearance is due to bowel wall hematoma.  2. Large hiatal hernia, chronic.    Echocardiogram 02/22/2013  - Left ventricle: The cavity size was normal. Systolic function was normal. The estimated ejection fraction was in the range of 55% to 60%. Wall motion was normal; there were no regional wall motion abnormalities. Doppler parameters are consistent with abnormal left ventricular relaxation (grade 1 diastolic dysfunction). Doppler parameters are consistent with elevated mean left atrial filling pressure. - Aortic valve: Mild regurgitation. - Mitral valve: Moderately calcified annulus. Mild regurgitation. - Right ventricle: Systolic pressure was increased. - Right atrium: The atrium was mildly dilated. - Tricuspid valve: Mild regurgitation. - Pulmonary arteries: PA peak pressure: 46mm Hg (S). Impressions:  - The right ventricular systolic pressure was increased  consistent with moderate pulmonary hypertension.    Assessment & Plan  1. Acute diastolic CHF - continue Lasix 20 mg po daily with KCL 10 mEq daily, Crea stable on 03/16/16, she is advised to take an extra Lasix 20 mg in the afternoon on the days when her swelling is worse..  2. Chronic a-fib, h/o CVA on chronic anticoagulation with coumadine - rate slow with dizziness, will decrease metoprolol to 12.5 mg by mouth twice a day, no more strokes.  3. Hypertension - well controlled, however fatigue and bradycardia, decrease lopressor to 12.5 mg po BID.  4. Hyperlipidemia - at goal on labs from 10/17, however her atorvastatin was discontinued as her memory is deteriorating.  5. Pulmonary hypertension - the patient is mildly asymptomatic and well compensated  Follow up in 6 months.   Tobias Alexander, MD, [facility] 04/28/2016, 9:31 AM

## 2016-05-25 ENCOUNTER — Ambulatory Visit (INDEPENDENT_AMBULATORY_CARE_PROVIDER_SITE_OTHER): Payer: Medicare Other | Admitting: *Deleted

## 2016-05-25 DIAGNOSIS — Z79899 Other long term (current) drug therapy: Secondary | ICD-10-CM | POA: Diagnosis not present

## 2016-05-25 DIAGNOSIS — I4891 Unspecified atrial fibrillation: Secondary | ICD-10-CM | POA: Diagnosis not present

## 2016-05-25 LAB — POCT INR: INR: 1.4

## 2016-06-05 ENCOUNTER — Ambulatory Visit (INDEPENDENT_AMBULATORY_CARE_PROVIDER_SITE_OTHER): Payer: Medicare Other | Admitting: *Deleted

## 2016-06-05 DIAGNOSIS — Z79899 Other long term (current) drug therapy: Secondary | ICD-10-CM

## 2016-06-05 DIAGNOSIS — I4891 Unspecified atrial fibrillation: Secondary | ICD-10-CM | POA: Diagnosis not present

## 2016-06-05 LAB — POCT INR: INR: 2.1

## 2016-06-16 ENCOUNTER — Encounter (HOSPITAL_COMMUNITY): Payer: Self-pay

## 2016-06-16 ENCOUNTER — Emergency Department (HOSPITAL_COMMUNITY): Payer: Medicare Other

## 2016-06-16 ENCOUNTER — Emergency Department (HOSPITAL_COMMUNITY)
Admission: EM | Admit: 2016-06-16 | Discharge: 2016-06-16 | Disposition: A | Discharge status: Home / Self Care | Payer: Medicare Other | Attending: Emergency Medicine | Admitting: Emergency Medicine

## 2016-06-16 DIAGNOSIS — N390 Urinary tract infection, site not specified: Secondary | ICD-10-CM | POA: Insufficient documentation

## 2016-06-16 DIAGNOSIS — I110 Hypertensive heart disease with heart failure: Secondary | ICD-10-CM | POA: Insufficient documentation

## 2016-06-16 DIAGNOSIS — Z7901 Long term (current) use of anticoagulants: Secondary | ICD-10-CM | POA: Diagnosis not present

## 2016-06-16 DIAGNOSIS — R51 Headache: Secondary | ICD-10-CM | POA: Diagnosis not present

## 2016-06-16 DIAGNOSIS — I5030 Unspecified diastolic (congestive) heart failure: Secondary | ICD-10-CM | POA: Diagnosis not present

## 2016-06-16 DIAGNOSIS — Z8673 Personal history of transient ischemic attack (TIA), and cerebral infarction without residual deficits: Secondary | ICD-10-CM | POA: Diagnosis not present

## 2016-06-16 DIAGNOSIS — E119 Type 2 diabetes mellitus without complications: Secondary | ICD-10-CM | POA: Insufficient documentation

## 2016-06-16 DIAGNOSIS — R4182 Altered mental status, unspecified: Secondary | ICD-10-CM | POA: Diagnosis not present

## 2016-06-16 LAB — CBC
HEMATOCRIT: 50 % — AB (ref 36.0–46.0)
HEMOGLOBIN: 17.2 g/dL — AB (ref 12.0–15.0)
MCH: 32.1 pg (ref 26.0–34.0)
MCHC: 34.4 g/dL (ref 30.0–36.0)
MCV: 93.3 fL (ref 78.0–100.0)
Platelets: 220 10*3/uL (ref 150–400)
RBC: 5.36 MIL/uL — AB (ref 3.87–5.11)
RDW: 13 % (ref 11.5–15.5)
WBC: 9.4 10*3/uL (ref 4.0–10.5)

## 2016-06-16 LAB — URINALYSIS, ROUTINE W REFLEX MICROSCOPIC
Bilirubin Urine: NEGATIVE
Glucose, UA: NEGATIVE mg/dL
Ketones, ur: NEGATIVE mg/dL
Nitrite: NEGATIVE
PROTEIN: 30 mg/dL — AB
SPECIFIC GRAVITY, URINE: 1.026 (ref 1.005–1.030)
pH: 5 (ref 5.0–8.0)

## 2016-06-16 LAB — I-STAT TROPONIN, ED: TROPONIN I, POC: 0.01 ng/mL (ref 0.00–0.08)

## 2016-06-16 LAB — DIFFERENTIAL
Basophils Absolute: 0 10*3/uL (ref 0.0–0.1)
Basophils Relative: 0 %
EOS ABS: 0.2 10*3/uL (ref 0.0–0.7)
EOS PCT: 2 %
LYMPHS ABS: 3.8 10*3/uL (ref 0.7–4.0)
LYMPHS PCT: 40 %
MONOS PCT: 7 %
Monocytes Absolute: 0.7 10*3/uL (ref 0.1–1.0)
Neutro Abs: 4.8 10*3/uL (ref 1.7–7.7)
Neutrophils Relative %: 51 %

## 2016-06-16 LAB — I-STAT CHEM 8, ED
BUN: 20 mg/dL (ref 6–20)
CALCIUM ION: 1.17 mmol/L (ref 1.15–1.40)
CREATININE: 1 mg/dL (ref 0.44–1.00)
Chloride: 101 mmol/L (ref 101–111)
Glucose, Bld: 161 mg/dL — ABNORMAL HIGH (ref 65–99)
HEMATOCRIT: 49 % — AB (ref 36.0–46.0)
HEMOGLOBIN: 16.7 g/dL — AB (ref 12.0–15.0)
Potassium: 4.2 mmol/L (ref 3.5–5.1)
Sodium: 138 mmol/L (ref 135–145)
TCO2: 26 mmol/L (ref 0–100)

## 2016-06-16 LAB — COMPREHENSIVE METABOLIC PANEL
ALK PHOS: 132 U/L — AB (ref 38–126)
ALT: 17 U/L (ref 14–54)
ANION GAP: 12 (ref 5–15)
AST: 22 U/L (ref 15–41)
Albumin: 4.1 g/dL (ref 3.5–5.0)
BILIRUBIN TOTAL: 1.2 mg/dL (ref 0.3–1.2)
BUN: 17 mg/dL (ref 6–20)
CALCIUM: 10 mg/dL (ref 8.9–10.3)
CO2: 24 mmol/L (ref 22–32)
Chloride: 102 mmol/L (ref 101–111)
Creatinine, Ser: 0.95 mg/dL (ref 0.44–1.00)
GFR calc Af Amer: 60 mL/min — ABNORMAL LOW (ref >60)
GFR, EST NON AFRICAN AMERICAN: 52 mL/min — AB (ref >60)
GLUCOSE: 156 mg/dL — AB (ref 65–99)
POTASSIUM: 4.3 mmol/L (ref 3.5–5.1)
Sodium: 138 mmol/L (ref 135–145)
TOTAL PROTEIN: 7.2 g/dL (ref 6.5–8.1)

## 2016-06-16 LAB — PROTIME-INR
INR: 1.56
Prothrombin Time: 18.9 seconds — ABNORMAL HIGH (ref 11.4–15.2)

## 2016-06-16 LAB — APTT: aPTT: 42 seconds — ABNORMAL HIGH (ref 24–36)

## 2016-06-16 MED ORDER — CEPHALEXIN 500 MG PO CAPS: 500.0000 mg | ORAL_CAPSULE | Freq: Three times a day (TID) | ORAL | 0 refills | Status: DC

## 2016-06-16 MED ORDER — CEPHALEXIN 250 MG PO CAPS
500.0000 mg | ORAL_CAPSULE | Freq: Once | ORAL | Status: AC
  Administered 2016-06-16: 500 mg via ORAL
  Filled 2016-06-16: qty 2

## 2016-06-16 NOTE — Discharge Instructions (Signed)
Give regular dose of coumadin today.   Give 1/2 tab of coumadin daily for next 6 days.  Resume your regular coumadin dosage next Friday.

## 2016-06-16 NOTE — ED Notes (Signed)
Patient transported to CT 

## 2016-06-16 NOTE — ED Notes (Signed)
Pt placed on bedpan

## 2016-06-16 NOTE — ED Notes (Signed)
Pt tolerated swallowing oral meds.

## 2016-06-16 NOTE — ED Triage Notes (Signed)
Pt. S son stated that pt. . Was confused yesterday and also today.  Wednesday is the last time she was normal.   She stated, "I had a headache all day yesterday."   She denies any pain at this time.  Pt. Is alert to self and family she is confused to time and place.  Skin is pink, warm and dry.  No acute distress noted.   No slurred speech noted.  Pt. Is moving all extremities.  No weakness noted.

## 2016-06-16 NOTE — ED Provider Notes (Signed)
[facility]ENCY DEPT Provider Note   CSN: [id number] Arrival date & time: 06/16/16  4098     History   Chief Complaint Chief Complaint  Patient presents with  . Altered Mental Status    HPI Lisa Hopkins is a 83 y.o. female. Chief complaint is confusion  HPI:  Patient family states that she has seemed "disoriented" since yesterday. Today is Friday. With the heavy snow on Tuesday and Wednesday she is not left the house. History morning she awakened and got up for a few minutes and then laid down to bed again for about 3 more hours. Her husband states that this is unusual for her. She also complained of a headache yesterday as well as today. 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 This has not changed.  She's not had any illness with fevers chills cough nausea vomiting. She is only complaining of a headache. No change in medications.  Past Medical History:  Diagnosis Date  . Anxiety   . Aphasia as late effect of cerebrovascular accident   . Atrial fibrillation (HCC)   . CVA (cerebral infarction)   . Diabetes mellitus type II   . GERD (gastroesophageal reflux disease)   . Hiatal hernia   . HTN (hypertension)   . Hyperlipidemia   . IBS (irritable bowel syndrome)   . Stroke (HCC)   . Vitamin D deficiency     Patient Active Problem List   Diagnosis Date Noted  . Atherosclerosis of aorta (HCC) 12/03/2015  . Medicare annual wellness visit, subsequent 02/18/2015  . GERD 09/25/2014  . Diastolic CHF (HCC) 08/04/2014  . Bursitis of hip 03/03/2014  . Medication management 07/01/2013  . Hyperlipidemia   . Vitamin D deficiency   . T2_NIDDM w/ CKD2 (GFR 64 ml/min) 03/15/2010  . Anxiety state 03/15/2010  . Essential hypertension 03/15/2010  . Atrial fibrillation (HCC) 03/15/2010  . APHASIA DUE TO CEREBROVASCULAR DISEASE 03/15/2010  . CVA  03/07/2010    Past Surgical  History:  Procedure Laterality Date  . CATARACT EXTRACTION, BILATERAL    . CHOLECYSTECTOMY    . COLONOSCOPY  2005  . TONSILLECTOMY    . UPPER GASTROINTESTINAL ENDOSCOPY  2005    OB History    No data available       Home Medications    Prior to Admission medications   Medication Sig Start Date End Date Taking? Authorizing Provider  acetaminophen (TYLENOL) 325 MG tablet Take 325-650 mg by mouth every 4 (four) hours as needed for mild pain, moderate pain or headache.    Yes Historical Provider, MD  Cholecalciferol (VITAMIN D) 2000 UNITS CAPS Take 2,000 Units by mouth daily.    Yes Historical Provider, MD  furosemide (LASIX) 20 MG tablet TAKE ONE TABLET BY MOUTH ONCE DAILY 03/07/16  Yes Lars Masson, MD  metoprolol tartrate (LOPRESSOR) 25 MG tablet Take 0.5 tablets (12.5 mg total) by mouth 2 (two) times daily. Patient taking differently: Take 25 mg by mouth 2 (two) times daily.  04/28/16 07/27/16 Yes Lars Masson, MD  pantoprazole (PROTONIX) 40 MG tablet TAKE ONE TABLET BY MOUTH ONCE DAILY FOR  ACID  INDIGESTION 02/24/16  Yes Lucky Cowboy, MD  potassium chloride (K-DUR) 10 MEQ tablet TAKE ONE TABLET BY MOUTH ONCE DAILY 04/04/16  Yes Lars Masson, MD  ranitidine (ZANTAC) 300 MG tablet TAKE ONE TABLET BY MOUTH TWICE DAILY 11/02/15  Yes Lucky Cowboy, MD  warfarin (COUMADIN) 2 MG tablet TAKE AS DIRECTED BY ANTICOAGULATION CLINIC 01/11/16  Yes Lars Masson, MD  cephALEXin (KEFLEX) 500 MG capsule Take 1 capsule (500 mg total) by mouth 3 (three) times daily. 06/16/16   Rolland Porter, MD  dicyclomine (BENTYL) 20 MG tablet Take 1 tablet 3 x day if needed for nausea, bloating , cramping or diarrhea Patient not taking: Reported on 06/16/2016 06/24/15   Lucky Cowboy, MD  Magnesium Hydroxide (MAGNESIA PO) Take 1 tablet by mouth daily.     Historical Provider, MD    Family History Family History  Problem Relation Age of Onset  . Diabetes Mother   . Stroke Mother   . Hypertension  Brother   . Heart disease Brother   . Arthritis Sister     Rhematoid  . Arthritis Sister     Rhematoid    Social History Social History  Substance Use Topics  . Smoking status: Never Smoker  . Smokeless tobacco: Never Used  . Alcohol use No     Allergies   Ace inhibitors; Fosamax [alendronate sodium]; Norvasc [amlodipine besylate]; and Zocor [simvastatin]   Review of Systems Review of Systems  Constitutional: Negative for appetite change, chills, diaphoresis, fatigue and fever.  HENT: Negative for mouth sores, sore throat and trouble swallowing.   Eyes: Negative for visual disturbance.  Respiratory: Negative for cough, chest tightness, shortness of breath and wheezing.   Cardiovascular: Negative for chest pain.  Gastrointestinal: Negative for abdominal distention, abdominal pain, diarrhea, nausea and vomiting.  Endocrine: Negative for polydipsia, polyphagia and polyuria.  Genitourinary: Negative for dysuria, frequency and hematuria.  Musculoskeletal: Negative for gait problem.  Skin: Negative for color change, pallor and rash.  Neurological: Positive for headaches. Negative for dizziness, syncope and light-headedness.  Hematological: Does not bruise/bleed easily.  Psychiatric/Behavioral: Negative for behavioral problems and confusion.     Physical Exam Updated Vital Signs BP 162/97   Pulse 81   Temp 97.9 F (36.6 C) (Oral)   Resp 18   SpO2 96%   Physical Exam  Constitutional: She is oriented to person, place, and time. She appears well-developed and well-nourished. No distress.  HENT:  Head: Normocephalic.  Eyes: Conjunctivae are normal. Pupils are equal, round, and reactive to light. No scleral icterus.  Neck: Normal range of motion. Neck supple. No thyromegaly present.  Cardiovascular: Normal rate and regular rhythm.  Exam reveals no gallop and no friction rub.   No murmur heard. Pulmonary/Chest: Effort normal and breath sounds normal. No respiratory  distress. She has no wheezes. She has no rales.  Abdominal: Soft. Bowel sounds are normal. She exhibits no distension. There is no tenderness. There is no rebound.  Musculoskeletal: Normal range of motion.  Neurological: She is alert and oriented to person, place, and time.  Expressive aphasia. No additional focal neurological deficits.  Skin: Skin is warm and dry. No rash noted.  Psychiatric: She has a normal mood and affect. Her behavior is normal.     ED Treatments / Results  Labs (all labs ordered are listed, but only abnormal results are displayed) Labs Reviewed  PROTIME-INR - Abnormal; Notable for the following:       Result Value   Prothrombin Time 18.9 (*)    All other components within normal limits  APTT - Abnormal; Notable for the following:    aPTT 42 (*)    All other components within normal limits  CBC - Abnormal; Notable for the following:    RBC 5.36 (*)  Hemoglobin 17.2 (*)    HCT 50.0 (*)    All other components within normal limits  COMPREHENSIVE METABOLIC PANEL - Abnormal; Notable for the following:    Glucose, Bld 156 (*)    Alkaline Phosphatase 132 (*)    GFR calc non Af Amer 52 (*)    GFR calc Af Amer 60 (*)    All other components within normal limits  URINALYSIS, ROUTINE W REFLEX MICROSCOPIC - Abnormal; Notable for the following:    Color, Urine AMBER (*)    APPearance CLOUDY (*)    Hgb urine dipstick SMALL (*)    Protein, ur 30 (*)    Leukocytes, UA SMALL (*)    Bacteria, UA MANY (*)    Squamous Epithelial / LPF TOO NUMEROUS TO COUNT (*)    All other components within normal limits  I-STAT CHEM 8, ED - Abnormal; Notable for the following:    Glucose, Bld 161 (*)    Hemoglobin 16.7 (*)    HCT 49.0 (*)    All other components within normal limits  DIFFERENTIAL  I-STAT TROPOININ, ED  CBG MONITORING, ED    EKG  EKG Interpretation None       Radiology Ct Head Wo Contrast  Result Date: 06/16/2016 CLINICAL DATA:  Confusion.  Headaches.  EXAM: CT HEAD WITHOUT CONTRAST TECHNIQUE: Contiguous axial images were obtained from the base of the skull through the vertex without intravenous contrast. COMPARISON:  06/08/2014 FINDINGS: Brain: Right cerebellar hemisphere lacunar infarct and right cerebellar hemisphere encephalomalacia are unchanged from previous exam, image 5 of series 201. Left frontal lobe encephalomalacia is also unchanged from previous exam. There is prominence of the sulci and ventricles identified. There is mild diffuse low-attenuation within the subcortical and periventricular white matter compatible with chronic microvascular disease. No evidence of acute infarction, hemorrhage, hydrocephalus, extra-axial collection or mass lesion/mass effect. Vascular: No hyperdense vessel or unexpected calcification. Skull: Normal. Negative for fracture or focal lesion. Sinuses/Orbits: No acute finding. Other: None. IMPRESSION: 1. No acute intracranial abnormalities. 2. Left frontal lobe and right cerebellar hemisphere encephalomalacia. 3. Atrophy and chronic small vessel ischemic change. Electronically Signed   By: Signa Kellaylor  Stroud M.D.   On: 06/16/2016 12:18    Procedures Procedures (including critical care time)  Medications Ordered in ED Medications  cephALEXin (KEFLEX) capsule 500 mg (not administered)     Initial Impression / Assessment and Plan / ED Course  I have reviewed the triage vital signs and the nursing notes.  Pertinent labs & imaging results that were available during my care of the patient were reviewed by me and considered in my medical decision making (see chart for details).   Studies reassuring. Cath urine unfortunately does show large amount squamous cells but does suggest infection. Patient asks when I discussed this finding with her "is that why it burns when I empty my bladder". Symptoms and findings consistent with UTI causing patient's confusion. Appropriate for discharge. Her INR is 1.56. We'll give regular  dose Coumadin today. She takes a full tablet alternating with half tablet. For the next 6 days while on antibiotic will have her take only half tablet and follow-up with Coumadin clinic.  Final Clinical Impressions(s) / ED Diagnoses   Final diagnoses:  Altered mental status, unspecified altered mental status type  Urinary tract infection without hematuria, site unspecified    New Prescriptions New Prescriptions   CEPHALEXIN (KEFLEX) 500 MG CAPSULE    Take 1 capsule (500 mg total) by mouth 3 (three)  times daily.     Rolland Porter, MD 06/16/16 1325

## 2016-06-20 ENCOUNTER — Ambulatory Visit (INDEPENDENT_AMBULATORY_CARE_PROVIDER_SITE_OTHER): Payer: Medicare Other | Admitting: Internal Medicine

## 2016-06-20 ENCOUNTER — Encounter: Payer: Self-pay | Admitting: Internal Medicine

## 2016-06-20 VITALS — BP 152/84 | HR 60 | Temp 97.3°F | Resp 16 | Ht 61.5 in | Wt 122.2 lb

## 2016-06-20 DIAGNOSIS — N3000 Acute cystitis without hematuria: Secondary | ICD-10-CM

## 2016-06-20 DIAGNOSIS — B373 Candidiasis of vulva and vagina: Secondary | ICD-10-CM | POA: Diagnosis not present

## 2016-06-20 DIAGNOSIS — I10 Essential (primary) hypertension: Secondary | ICD-10-CM

## 2016-06-20 DIAGNOSIS — B3731 Acute candidiasis of vulva and vagina: Secondary | ICD-10-CM

## 2016-06-20 MED ORDER — FLUCONAZOLE 150 MG PO TABS: ORAL_TABLET | ORAL | 2 refills | Status: DC

## 2016-06-20 NOTE — Progress Notes (Signed)
[facility]   Lucky Cowboy, M.D.    Dyanne Carrel. Steffanie Dunn, P.A.-C      Terri Piedra, P.A.-C  [facility]                [address]                [place], [place]. [address] Telephone [phone] Telefax [phone]  Subjective:    Patient ID: Lisa Hopkins, female    DOB: [birth year], 83 y.o.   MRN: [id number]  HPI  This very nice 83 yo MWF with HTN, HLD, T2_DM, cAfib/hx/o embolic CVA on coumadin was eval at ER on 06/17/2015 for AMS and was found to have an elevated BP 162/97 and a UTI (not cultured) and treated with Keflex and she presents today improved at her baseline with dysnomia and poor ST recall. Also at home her BP's have been reported sl elevated in the range 150's/80-90's per her husband.   Medication Sig  . acetaminophen  325 MG tablet Take 325-650 mg every 4  hours as needed  . cephALEXin ( 500 MG capsule Take 1 cap 3  times daily.  Marland Kitchen VITAMIN D 2000 UNITS CAPS Take 2,000 Units  daily.   Marland Kitchen dicyclomine  20 MG tablet Take 1 tablet 3 x day if needed for nausea, bloating , cramping or diarrhea  . furosemide  20 MG tablet TAKE ONE TAB ONCE DAILY  . Milk of MAGNESIA  Take 1 tablet by mouth daily.   . metoprolol tartrate  25 MG tablet Take 1/2 tab  2 times daily.   . pantoprazole  40 MG tablet TAKE ONE TABONCE DAILY FOR  ACID  INDIGESTION  . K-DUR 10 MEQ tablet TAKE ONE TAB ONCE DAILY  . ranitidine  300 MG tablet TAKE ONE TABTWICE DAILY  . warfarin  2 MG tablet TAKE AS DIRECTED BY ANTICOAGULATION CLINIC - advised at ER to only take 1/2 tab daily while on Keflex   Allergies  Allergen Reactions  . Ace Inhibitors     Cough  . Fosamax [Alendronate Sodium]     Heart burn  . Norvasc [Amlodipine Besylate]     edema  . Zocor [Simvastatin]     Elevated CPK   Past Medical History:  Diagnosis Date  . Anxiety   . Aphasia as late effect of cerebrovascular accident   . Atrial fibrillation (HCC)   . CVA  (cerebral infarction)   . Diabetes mellitus type II   . GERD (gastroesophageal reflux disease)   . Hiatal hernia   . HTN (hypertension)   . Hyperlipidemia   . IBS (irritable bowel syndrome)   . Stroke (HCC)   . Vitamin D deficiency    Past Surgical History:  Procedure Laterality Date  . CATARACT EXTRACTION, BILATERAL    . CHOLECYSTECTOMY    . COLONOSCOPY  2005  . TONSILLECTOMY    . UPPER GASTROINTESTINAL ENDOSCOPY  2005   Review of Systems  Patient's limited recall & comprehension precludes reasonable reliability, but as can be ascertained from her husband, a 10 point systems review negative except for c/o vaginal itching w/o discharge.    Objective:   Physical Exam  BP (!) 152/84   Pulse 60   Temp 97.3 F (36.3 C)   Resp 16   Ht 5' 1.5" (1.562 m)   Wt 122 lb 3.2 oz (55.4 kg)   BMI 22.72 kg/m   BP rechecked at 157/94  HEENT -  Eac's patent. TM's Nl. EOM's full. PERRLA. NasoOroPharynx clear. Neck - supple. Nl Thyroid. Carotids 2+ & No bruits, nodes, JVD Chest - Clear equal BS w/o Rales, rhonchi, wheezes. Cor - Nl HS. RRR w/o sig MGR. PP 1(+). No edema. MS- FROM w/o deformities. Muscle power, tone and bulk Nl. Gait Nl. Neuro - No obvious Cr N abnormalities. Sensory, motor and Cerebellar functions appear Nl w/o focal abnormalities. Psyche - Mental status indifferent. Limited comprehension.    Assessment & Plan:   1. Essential hypertension  - Advised to increase Metoprolol 25 mg to 1 whole tablet 2 x/daily and continue BP monitoring until return in 1 week for previously scheduled OV.   2. Acute cystitis without hematuria  - Recheck U/A w/C&S at next OV  3. Candida vaginitis  - Empiric treat with  fluconazole (DIFLUCAN) 150 MG tablet; Take 1 tablet / week for yeast vaginal infection  Dispense: 1 tablet; Refill: 2

## 2016-06-21 ENCOUNTER — Other Ambulatory Visit: Payer: Self-pay | Admitting: Cardiology

## 2016-06-21 DIAGNOSIS — H40013 Open angle with borderline findings, low risk, bilateral: Secondary | ICD-10-CM | POA: Diagnosis not present

## 2016-06-21 DIAGNOSIS — Z961 Presence of intraocular lens: Secondary | ICD-10-CM | POA: Diagnosis not present

## 2016-06-21 DIAGNOSIS — H35033 Hypertensive retinopathy, bilateral: Secondary | ICD-10-CM | POA: Diagnosis not present

## 2016-06-21 DIAGNOSIS — H5310 Unspecified subjective visual disturbances: Secondary | ICD-10-CM | POA: Diagnosis not present

## 2016-06-21 NOTE — Telephone Encounter (Signed)
Rx refill sent to pharmacy. 

## 2016-06-22 ENCOUNTER — Ambulatory Visit: Payer: Self-pay | Admitting: Internal Medicine

## 2016-06-23 ENCOUNTER — Ambulatory Visit (INDEPENDENT_AMBULATORY_CARE_PROVIDER_SITE_OTHER): Payer: Medicare Other | Admitting: *Deleted

## 2016-06-23 DIAGNOSIS — Z79899 Other long term (current) drug therapy: Secondary | ICD-10-CM

## 2016-06-23 DIAGNOSIS — I4891 Unspecified atrial fibrillation: Secondary | ICD-10-CM

## 2016-06-23 LAB — POCT INR: INR: 1.6

## 2016-06-29 ENCOUNTER — Encounter: Payer: Self-pay | Admitting: Internal Medicine

## 2016-06-29 ENCOUNTER — Ambulatory Visit (INDEPENDENT_AMBULATORY_CARE_PROVIDER_SITE_OTHER): Payer: Medicare Other | Admitting: Internal Medicine

## 2016-06-29 VITALS — BP 142/96 | HR 62 | Temp 97.8°F | Resp 14 | Ht 61.5 in | Wt 122.0 lb

## 2016-06-29 DIAGNOSIS — F0391 Unspecified dementia with behavioral disturbance: Secondary | ICD-10-CM

## 2016-06-29 DIAGNOSIS — I10 Essential (primary) hypertension: Secondary | ICD-10-CM | POA: Diagnosis not present

## 2016-06-29 DIAGNOSIS — R300 Dysuria: Secondary | ICD-10-CM

## 2016-06-29 LAB — CBC WITH DIFFERENTIAL/PLATELET
Basophils Absolute: 0 cells/uL (ref 0–200)
Basophils Relative: 0 %
Eosinophils Absolute: 216 cells/uL (ref 15–500)
Eosinophils Relative: 3 %
HCT: 48.2 % — ABNORMAL HIGH (ref 35.0–45.0)
Hemoglobin: 16.3 g/dL — ABNORMAL HIGH (ref 11.7–15.5)
Lymphocytes Relative: 40 %
Lymphs Abs: 2880 cells/uL (ref 850–3900)
MCH: 31.5 pg (ref 27.0–33.0)
MCHC: 33.8 g/dL (ref 32.0–36.0)
MCV: 93.1 fL (ref 80.0–100.0)
MPV: 9.9 fL (ref 7.5–12.5)
Monocytes Absolute: 648 cells/uL (ref 200–950)
Monocytes Relative: 9 %
Neutro Abs: 3456 cells/uL (ref 1500–7800)
Neutrophils Relative %: 48 %
Platelets: 212 10*3/uL (ref 140–400)
RBC: 5.18 MIL/uL — ABNORMAL HIGH (ref 3.80–5.10)
RDW: 13.1 % (ref 11.0–15.0)
WBC: 7.2 10*3/uL (ref 3.8–10.8)

## 2016-06-29 LAB — URINALYSIS, ROUTINE W REFLEX MICROSCOPIC
Bilirubin Urine: NEGATIVE
Glucose, UA: NEGATIVE
Ketones, ur: NEGATIVE
Leukocytes, UA: NEGATIVE
Nitrite: NEGATIVE
Protein, ur: NEGATIVE
Specific Gravity, Urine: 1.023 (ref 1.001–1.035)
pH: 5 (ref 5.0–8.0)

## 2016-06-29 LAB — COMPREHENSIVE METABOLIC PANEL
ALBUMIN: 4 g/dL (ref 3.6–5.1)
ALT: 16 U/L (ref 6–29)
AST: 20 U/L (ref 10–35)
Alkaline Phosphatase: 116 U/L (ref 33–130)
BUN: 17 mg/dL (ref 7–25)
CHLORIDE: 102 mmol/L (ref 98–110)
CO2: 27 mmol/L (ref 20–31)
CREATININE: 1.06 mg/dL — AB (ref 0.60–0.88)
Calcium: 9.8 mg/dL (ref 8.6–10.4)
GLUCOSE: 118 mg/dL — AB (ref 65–99)
Potassium: 4.3 mmol/L (ref 3.5–5.3)
Sodium: 140 mmol/L (ref 135–146)
Total Bilirubin: 0.5 mg/dL (ref 0.2–1.2)
Total Protein: 7 g/dL (ref 6.1–8.1)

## 2016-06-29 MED ORDER — NYSTATIN 100000 UNIT/GM EX CREA: 1.0000 "application " | TOPICAL_CREAM | Freq: Four times a day (QID) | CUTANEOUS | 0 refills | Status: DC

## 2016-06-29 MED ORDER — LOSARTAN POTASSIUM 50 MG PO TABS: 50.0000 mg | ORAL_TABLET | Freq: Every day | ORAL | 11 refills | Status: DC

## 2016-06-29 NOTE — Progress Notes (Signed)
Assessment and Plan: Concern that patient has been having visual hallucinations and has had some slowing of her cognitive faculties per my own opinion and of her husband.  She did have ER workup on the 19th and had CT scan with severe atrophy of the right hemisphere and also the left frontal lobe and hallucinations could be due to dementia vs. Delirium.  No sedating medications are on board.  Also could be leftover from recent UTI if the keflex did not have good coverage as Urine in ER not cultured.  If urine negative today will likely try seroquel to see if this stops hallucinations.  Will also order home health as I don't believe either the patient or her husband are capable of managing their medications anymore.    1. Dysuria  - Urinalysis, Routine w reflex microscopic - Urine culture - CBC with Differential/Platelet - Comprehensive metabolic panel  2. Essential hypertension  - losartan (COZAAR) 50 MG tablet; Take 1 tablet (50 mg total) by mouth daily.  Dispense: 30 tablet; Refill: 11     HPI 83 y.o.female presents for 1 week follow-up of hypertension and also for dysuria.  She finished the keflex.  She also took two tablets of diflucan.  She is still having vulvar itching and some increased urinary frequency.  She reports that she has a headache today, but has been having some issues with her vision.  She reports that she feels like she is seeing people on both sides of her.  She has not had auditory hallucinations.  She reports that she did go to see Dr. Elmer Picker this week.  They did not increase her metoprolol as suggested by Dr. Oneta Rack at their last visit.  Her husband notes that he has been giving her tylenol in the morning for her headaches.    Past Medical History:  Diagnosis Date  . Anxiety   . Aphasia as late effect of cerebrovascular accident   . Atrial fibrillation (HCC)   . CVA (cerebral infarction)   . Diabetes mellitus type II   . GERD (gastroesophageal reflux disease)   .  Hiatal hernia   . HTN (hypertension)   . Hyperlipidemia   . IBS (irritable bowel syndrome)   . Stroke (HCC)   . Vitamin D deficiency      Allergies  Allergen Reactions  . Ace Inhibitors     Cough  . Fosamax [Alendronate Sodium]     Heart burn  . Norvasc [Amlodipine Besylate]     edema  . Zocor [Simvastatin]     Elevated CPK      Current Outpatient Prescriptions on File Prior to Visit  Medication Sig Dispense Refill  . acetaminophen (TYLENOL) 325 MG tablet Take 325-650 mg by mouth every 4 (four) hours as needed for mild pain, moderate pain or headache.     . Cholecalciferol (VITAMIN D) 2000 UNITS CAPS Take 2,000 Units by mouth daily.     Marland Kitchen dicyclomine (BENTYL) 20 MG tablet Take 1 tablet 3 x day if needed for nausea, bloating , cramping or diarrhea 90 tablet 1  . furosemide (LASIX) 20 MG tablet TAKE ONE TABLET BY MOUTH ONCE DAILY 90 tablet 1  . Magnesium Hydroxide (MAGNESIA PO) Take 1 tablet by mouth daily.     . metoprolol tartrate (LOPRESSOR) 25 MG tablet Take 0.5 tablets (12.5 mg total) by mouth 2 (two) times daily. (Patient taking differently: Take 25 mg by mouth 2 (two) times daily. ) 180 tablet 3  . pantoprazole (PROTONIX) 40  MG tablet TAKE ONE TABLET BY MOUTH ONCE DAILY FOR  ACID  INDIGESTION 90 tablet 1  . potassium chloride (K-DUR) 10 MEQ tablet TAKE ONE TABLET BY MOUTH ONCE DAILY 90 tablet 0  . ranitidine (ZANTAC) 300 MG tablet TAKE ONE TABLET BY MOUTH TWICE DAILY 180 tablet 1  . warfarin (COUMADIN) 2 MG tablet TAKE AS DIRECTED BY ANTICOAGULATION CLINIC 35 tablet 3   No current facility-administered medications on file prior to visit.     Review of Systems  Constitutional: Positive for malaise/fatigue. Negative for chills and fever.  HENT: Negative for congestion, hearing loss, sinus pain and sore throat.   Respiratory: Negative for cough, shortness of breath and wheezing.   Cardiovascular: Negative for chest pain, palpitations and leg swelling.  Gastrointestinal:  Negative for abdominal pain, blood in stool, constipation, diarrhea, heartburn, melena, nausea and vomiting.  Genitourinary: Positive for frequency. Negative for dysuria, hematuria and urgency.  Neurological: Negative for dizziness, sensory change and loss of consciousness.  Psychiatric/Behavioral: Positive for hallucinations and memory loss. Negative for depression. The patient is not nervous/anxious.       Physical Exam: Filed Weights   06/29/16 1048  Weight: 122 lb (55.3 kg)   BP (!) 142/96   Pulse 62   Temp 97.8 F (36.6 C) (Temporal)   Resp 14   Ht 5' 1.5" (1.562 m)   Wt 122 lb (55.3 kg)   BMI 22.68 kg/m  General Appearance: Well developed well nourished, non-toxic appearing in no apparent distress. Eyes: PERRLA, EOMs, conjunctiva w/ no swelling or erythema or discharge.  Normal peripheral visual fields.   Sinuses: No Frontal/maxillary tenderness ENT/Mouth: Ear canals clear without swelling or erythema.  TM's normal bilaterally with no retractions, bulging, or loss of landmarks.   Neck: Supple, thyroid normal, no notable JVD  Respiratory: Respiratory effort normal, Clear breath sounds anteriorly and posteriorly bilaterally without rales, rhonchi, wheezing or stridor. No retractions or accessory muscle usage. Cardio: RRR with no MRGs.   Abdomen: Soft, + BS.  Non tender, no guarding, rebound, hernias, masses.  Musculoskeletal: Full ROM, 5/5 strength, normal gait.  Skin: Warm, dry without rashes  Neuro: Awake and oriented X 3, Cranial nerves intact. Normal muscle tone and strength testing in all limbs is equal and symmetric and 4/5. no cerebellar symptoms. Sensation intact.  Psych: Aloof affect, Insight and Judgment appropriate.     Terri Piedraourtney Forcucci, PA-C 11:06 AM [facility]

## 2016-06-29 NOTE — Patient Instructions (Signed)
Please start taking losartan once daily with lunch.  Please continue to take metoprolol 1/2 tablet in the morning and in the evenings.   Please use the nystatin cream twice daily on your genitals to help with itching and burning.  I will be sending somebody to the house to help you organize your medications so they are easier to take.  Please continue to check your blood pressure 3 times per day.

## 2016-06-30 LAB — URINALYSIS, MICROSCOPIC ONLY
Bacteria, UA: NONE SEEN [HPF]
RBC / HPF: NONE SEEN RBC/HPF (ref <=2)
Yeast: NONE SEEN [HPF]

## 2016-07-01 LAB — URINE CULTURE

## 2016-07-03 ENCOUNTER — Other Ambulatory Visit: Payer: Self-pay | Admitting: Internal Medicine

## 2016-07-03 MED ORDER — QUETIAPINE FUMARATE 25 MG PO TABS: 25.0000 mg | ORAL_TABLET | Freq: Every day | ORAL | 0 refills | Status: DC

## 2016-07-04 ENCOUNTER — Other Ambulatory Visit: Payer: Self-pay | Admitting: *Deleted

## 2016-07-04 DIAGNOSIS — I10 Essential (primary) hypertension: Secondary | ICD-10-CM

## 2016-07-04 DIAGNOSIS — Z79899 Other long term (current) drug therapy: Secondary | ICD-10-CM

## 2016-07-04 DIAGNOSIS — I5032 Chronic diastolic (congestive) heart failure: Secondary | ICD-10-CM

## 2016-07-04 DIAGNOSIS — I4891 Unspecified atrial fibrillation: Secondary | ICD-10-CM

## 2016-07-04 MED ORDER — POTASSIUM CHLORIDE ER 10 MEQ PO TBCR: 10.0000 meq | EXTENDED_RELEASE_TABLET | Freq: Every day | ORAL | 0 refills | Status: DC

## 2016-07-04 MED ORDER — QUETIAPINE FUMARATE 25 MG PO TABS: 25.0000 mg | ORAL_TABLET | Freq: Every day | ORAL | 0 refills | Status: DC

## 2016-07-04 MED ORDER — LOSARTAN POTASSIUM 50 MG PO TABS: 50.0000 mg | ORAL_TABLET | Freq: Every day | ORAL | 11 refills | Status: DC

## 2016-07-04 MED ORDER — PANTOPRAZOLE SODIUM 40 MG PO TBEC: DELAYED_RELEASE_TABLET | ORAL | 1 refills | Status: DC

## 2016-07-04 MED ORDER — FUROSEMIDE 20 MG PO TABS: 20.0000 mg | ORAL_TABLET | Freq: Every day | ORAL | 1 refills | Status: DC

## 2016-07-04 MED ORDER — METOPROLOL TARTRATE 25 MG PO TABS: 12.5000 mg | ORAL_TABLET | Freq: Two times a day (BID) | ORAL | 3 refills | Status: DC

## 2016-07-04 MED ORDER — RANITIDINE HCL 300 MG PO TABS: 300.0000 mg | ORAL_TABLET | Freq: Two times a day (BID) | ORAL | 1 refills | Status: DC

## 2016-07-07 ENCOUNTER — Ambulatory Visit (INDEPENDENT_AMBULATORY_CARE_PROVIDER_SITE_OTHER): Payer: Medicare Other | Admitting: *Deleted

## 2016-07-07 DIAGNOSIS — I4891 Unspecified atrial fibrillation: Secondary | ICD-10-CM | POA: Diagnosis not present

## 2016-07-07 DIAGNOSIS — Z79899 Other long term (current) drug therapy: Secondary | ICD-10-CM

## 2016-07-07 LAB — POCT INR: INR: 2.2

## 2016-07-10 ENCOUNTER — Other Ambulatory Visit: Payer: Self-pay | Admitting: *Deleted

## 2016-07-10 MED ORDER — WARFARIN SODIUM 2 MG PO TABS: ORAL_TABLET | ORAL | 3 refills | Status: DC

## 2016-07-14 ENCOUNTER — Ambulatory Visit (INDEPENDENT_AMBULATORY_CARE_PROVIDER_SITE_OTHER): Payer: Medicare Other | Admitting: Internal Medicine

## 2016-07-14 ENCOUNTER — Encounter: Payer: Self-pay | Admitting: Internal Medicine

## 2016-07-14 VITALS — BP 148/94 | HR 80 | Temp 97.8°F | Resp 16 | Ht 61.5 in

## 2016-07-14 DIAGNOSIS — I10 Essential (primary) hypertension: Secondary | ICD-10-CM | POA: Diagnosis not present

## 2016-07-14 DIAGNOSIS — R441 Visual hallucinations: Secondary | ICD-10-CM

## 2016-07-14 MED ORDER — LOSARTAN POTASSIUM 50 MG PO TABS: ORAL_TABLET | ORAL | 11 refills | Status: DC

## 2016-07-14 MED ORDER — QUETIAPINE FUMARATE 25 MG PO TABS: 12.5000 mg | ORAL_TABLET | Freq: Every day | ORAL | 0 refills | Status: DC

## 2016-07-14 NOTE — Progress Notes (Signed)
Assessment and Plan:   1. Essential hypertension -still unsure whether patient is taking medications correctly.  At risk of over medicating and dropping BP too low will allow for second 50 mg of losartan at lunch time if BP >150/90. - losartan (COZAAR) 50 MG tablet; Take 1 tablet in the morning.  Check blood pressure at lunch.  If blood pressure is over 140/90 please give another tablet.  Dispense: 60 tablet; Refill: 11  2.  Visual Hallucinations secondary to dementia -improved with seroquel -does cause significant daytime sedation.  Will try to do 12.5 mg of medications    HPI 83 y.o.female presents for 2 week follow-up of elevated blood pressure and visual hallucinations.  She was started on seroquel 25 mg.  She reports that she is no longer seeing men standing next to her.  She is sleeping more though. She is also dizzy when she first wakes up.  Her husband reports that she sleeps a lot during the day as well.  She is taking her medications as listed on her medication list but she is still running approximately 150/90+. She has had somebody comeby to help her get established with home health for help with medication compliance.  They have not come back in a week.    Past Medical History:  Diagnosis Date  . Anxiety   . Aphasia as late effect of cerebrovascular accident   . Atrial fibrillation (HCC)   . CVA (cerebral infarction)   . Diabetes mellitus type II   . GERD (gastroesophageal reflux disease)   . Hiatal hernia   . HTN (hypertension)   . Hyperlipidemia   . IBS (irritable bowel syndrome)   . Stroke (HCC)   . Vitamin D deficiency      Allergies  Allergen Reactions  . Ace Inhibitors     Cough  . Fosamax [Alendronate Sodium]     Heart burn  . Norvasc [Amlodipine Besylate]     edema  . Zocor [Simvastatin]     Elevated CPK      Current Outpatient Prescriptions on File Prior to Visit  Medication Sig Dispense Refill  . acetaminophen (TYLENOL) 325 MG tablet Take 325-650  mg by mouth every 4 (four) hours as needed for mild pain, moderate pain or headache.     . Cholecalciferol (VITAMIN D) 2000 UNITS CAPS Take 2,000 Units by mouth daily.     Marland Kitchen dicyclomine (BENTYL) 20 MG tablet Take 1 tablet 3 x day if needed for nausea, bloating , cramping or diarrhea 90 tablet 1  . furosemide (LASIX) 20 MG tablet Take 1 tablet (20 mg total) by mouth daily. 90 tablet 1  . losartan (COZAAR) 50 MG tablet Take 1 tablet (50 mg total) by mouth daily. 30 tablet 11  . Magnesium Hydroxide (MAGNESIA PO) Take 1 tablet by mouth daily.     . metoprolol tartrate (LOPRESSOR) 25 MG tablet Take 0.5 tablets (12.5 mg total) by mouth 2 (two) times daily. 180 tablet 3  . nystatin cream (MYCOSTATIN) Apply 1 application topically 4 (four) times daily. Apply to affected area every 4-6 hours x 10 days 30 g 0  . pantoprazole (PROTONIX) 40 MG tablet TAKE ONE TABLET BY MOUTH ONCE DAILY FOR  ACID  INDIGESTION 90 tablet 1  . potassium chloride (K-DUR) 10 MEQ tablet Take 1 tablet (10 mEq total) by mouth daily. 90 tablet 0  . QUEtiapine (SEROQUEL) 25 MG tablet Take 1 tablet (25 mg total) by mouth at bedtime. 30 tablet 0  .  ranitidine (ZANTAC) 300 MG tablet Take 1 tablet (300 mg total) by mouth 2 (two) times daily. 180 tablet 1  . warfarin (COUMADIN) 2 MG tablet TAKE AS DIRECTED BY ANTICOAGULATION CLINIC 35 tablet 3   No current facility-administered medications on file prior to visit.     Review of Systems  Unable to perform ROS: Dementia  See above which is a combination of information provided by patient and husband.   Physical Exam: There were no vitals filed for this visit. BP (!) 148/94   Pulse 80   Temp 97.8 F (36.6 C) (Temporal)   Resp 16   Ht 5' 1.5" (1.562 m)  General Appearance: Well dresses, appears older than state age, white female appearing in no apparent distress. Eyes: PERRLA, EOMs, conjunctiva w/ no swelling or erythema or discharge Sinuses: No Frontal/maxillary  tenderness ENT/Mouth: Ear canals clear without swelling or erythema.  TM's normal bilaterally with no retractions, bulging, or loss of landmarks.   Neck: Supple, thyroid normal, no notable JVD  Respiratory: Respiratory effort normal, Clear breath sounds anteriorly and posteriorly bilaterally without rales, rhonchi, wheezing or stridor. No retractions or accessory muscle usage. Cardio: ireg ireg with no MRGs. Trace peripheral edema   Abdomen: Soft, + BS.  Non tender, no guarding, rebound, hernias, masses.  Musculoskeletal: Full ROM, 5/5 strength, slow unsteady gait with no assistive devices.  Skin: Warm, dry without rashes  Neuro: Awake and oriented X 3, Cranial nerves intact. Normal muscle tone, no cerebellar symptoms. Sensation intact.  Psych: normal affect, Insight and Judgment appropriate.     Terri Piedraourtney Forcucci, PA-C 11:42 AM [facility]

## 2016-07-14 NOTE — Patient Instructions (Signed)
Please cut the seroquel in half and take 30 minutes prior to bedtime.  Please take the blood pressure at lunch time.  If you see a reading above 140/90 please give a second tablet of losartan.   I will send somebody out to the house to help with physical therapy.  Try using either a travel pillow or possibly using pillows on both sides of the recliner to help keep the neck aligned.  Try to sleep in the bed NOT the recliner.

## 2016-07-20 ENCOUNTER — Other Ambulatory Visit: Payer: Self-pay | Admitting: *Deleted

## 2016-07-20 DIAGNOSIS — I69320 Aphasia following cerebral infarction: Secondary | ICD-10-CM | POA: Diagnosis not present

## 2016-07-20 DIAGNOSIS — F419 Anxiety disorder, unspecified: Secondary | ICD-10-CM | POA: Diagnosis not present

## 2016-07-20 DIAGNOSIS — K589 Irritable bowel syndrome without diarrhea: Secondary | ICD-10-CM | POA: Diagnosis not present

## 2016-07-20 DIAGNOSIS — I5030 Unspecified diastolic (congestive) heart failure: Secondary | ICD-10-CM | POA: Diagnosis not present

## 2016-07-20 DIAGNOSIS — I130 Hypertensive heart and chronic kidney disease with heart failure and stage 1 through stage 4 chronic kidney disease, or unspecified chronic kidney disease: Secondary | ICD-10-CM | POA: Diagnosis not present

## 2016-07-20 DIAGNOSIS — N182 Chronic kidney disease, stage 2 (mild): Secondary | ICD-10-CM | POA: Diagnosis not present

## 2016-07-20 DIAGNOSIS — F0391 Unspecified dementia with behavioral disturbance: Secondary | ICD-10-CM | POA: Diagnosis not present

## 2016-07-20 DIAGNOSIS — I4891 Unspecified atrial fibrillation: Secondary | ICD-10-CM | POA: Diagnosis not present

## 2016-07-20 DIAGNOSIS — E785 Hyperlipidemia, unspecified: Secondary | ICD-10-CM | POA: Diagnosis not present

## 2016-07-20 DIAGNOSIS — E1122 Type 2 diabetes mellitus with diabetic chronic kidney disease: Secondary | ICD-10-CM | POA: Diagnosis not present

## 2016-07-25 ENCOUNTER — Ambulatory Visit (INDEPENDENT_AMBULATORY_CARE_PROVIDER_SITE_OTHER): Payer: Medicare Other | Admitting: *Deleted

## 2016-07-25 DIAGNOSIS — Z79899 Other long term (current) drug therapy: Secondary | ICD-10-CM

## 2016-07-25 DIAGNOSIS — I4891 Unspecified atrial fibrillation: Secondary | ICD-10-CM | POA: Diagnosis not present

## 2016-07-25 LAB — POCT INR: INR: 2.2

## 2016-07-26 ENCOUNTER — Telehealth: Payer: Self-pay | Admitting: *Deleted

## 2016-07-26 NOTE — Telephone Encounter (Signed)
Speech therapist called and requested an order for visits 1 time a week x 4 weeks for cognition.  Per Dr Oneta RackMcKeown, it is OK.  Lisa Hopkins is aware.

## 2016-08-12 ENCOUNTER — Other Ambulatory Visit: Payer: Self-pay | Admitting: Internal Medicine

## 2016-08-12 MED ORDER — QUETIAPINE FUMARATE 25 MG PO TABS: ORAL_TABLET | ORAL | 1 refills | Status: DC

## 2016-08-22 ENCOUNTER — Ambulatory Visit (INDEPENDENT_AMBULATORY_CARE_PROVIDER_SITE_OTHER): Payer: Medicare Other | Admitting: *Deleted

## 2016-08-22 DIAGNOSIS — I4891 Unspecified atrial fibrillation: Secondary | ICD-10-CM

## 2016-08-22 DIAGNOSIS — Z79899 Other long term (current) drug therapy: Secondary | ICD-10-CM | POA: Diagnosis not present

## 2016-08-22 LAB — POCT INR: INR: 2.3

## 2016-08-23 ENCOUNTER — Encounter: Payer: Self-pay | Admitting: Internal Medicine

## 2016-08-23 ENCOUNTER — Ambulatory Visit (INDEPENDENT_AMBULATORY_CARE_PROVIDER_SITE_OTHER): Payer: Medicare Other | Admitting: Internal Medicine

## 2016-08-23 VITALS — BP 124/76 | HR 72 | Temp 98.2°F | Resp 14 | Ht 61.5 in | Wt 124.0 lb

## 2016-08-23 DIAGNOSIS — R300 Dysuria: Secondary | ICD-10-CM

## 2016-08-23 MED ORDER — LEVOFLOXACIN 750 MG PO TABS: 750.0000 mg | ORAL_TABLET | Freq: Every day | ORAL | 0 refills | Status: DC

## 2016-08-23 NOTE — Progress Notes (Signed)
Assessment and Plan:   1. Dysuria -patient with lower abdominal pressure and foul smelling odor.  She does also get mildly choked up on food and swallowing.  She is at risk for aspiration.  Will need to cover for possible respiratory source.  Recommended soft food diet.   - levofloxacin (LEVAQUIN) 750 MG tablet; Take 1 tablet (750 mg total) by mouth daily. X 7 days  Dispense: 7 tablet; Refill: 0 - Urinalysis, Routine w reflex microscopic - Culture, Urine    HPI 83 y.o.female presents for feelings of malaise and also a low grade fever.  We were initially called by home health for concerns for a UTI.  She reports that she has some malodorous urine and some surpapubic pain.  She reports that she is not having pain or frequency out of the ordinary.  She is moving her bowels regularly.  She has had low grade temps.  She is drinking prune juice.    Past Medical History:  Diagnosis Date  . Anxiety   . Aphasia as late effect of cerebrovascular accident   . Atrial fibrillation (HCC)   . CVA (cerebral infarction)   . Diabetes mellitus type II   . GERD (gastroesophageal reflux disease)   . Hiatal hernia   . HTN (hypertension)   . Hyperlipidemia   . IBS (irritable bowel syndrome)   . Stroke (HCC)   . Vitamin D deficiency      Allergies  Allergen Reactions  . Ace Inhibitors     Cough  . Fosamax [Alendronate Sodium]     Heart burn  . Norvasc [Amlodipine Besylate]     edema  . Zocor [Simvastatin]     Elevated CPK      Current Outpatient Prescriptions on File Prior to Visit  Medication Sig Dispense Refill  . acetaminophen (TYLENOL) 325 MG tablet Take 325-650 mg by mouth every 4 (four) hours as needed for mild pain, moderate pain or headache.     . Cholecalciferol (VITAMIN D) 2000 UNITS CAPS Take 2,000 Units by mouth daily.     Marland Kitchen. dicyclomine (BENTYL) 20 MG tablet Take 1 tablet 3 x day if needed for nausea, bloating , cramping or diarrhea 90 tablet 1  . furosemide (LASIX) 20 MG tablet  Take 1 tablet (20 mg total) by mouth daily. 90 tablet 1  . losartan (COZAAR) 50 MG tablet Take 1 tablet in the morning.  Check blood pressure at lunch.  If blood pressure is over 140/90 please give another tablet. 60 tablet 11  . Magnesium Hydroxide (MAGNESIA PO) Take 1 tablet by mouth daily.     . metoprolol tartrate (LOPRESSOR) 25 MG tablet Take 0.5 tablets (12.5 mg total) by mouth 2 (two) times daily. 180 tablet 3  . nystatin cream (MYCOSTATIN) Apply 1 application topically 4 (four) times daily. Apply to affected area every 4-6 hours x 10 days 30 g 0  . pantoprazole (PROTONIX) 40 MG tablet TAKE ONE TABLET BY MOUTH ONCE DAILY FOR  ACID  INDIGESTION 90 tablet 1  . potassium chloride (K-DUR) 10 MEQ tablet Take 1 tablet (10 mEq total) by mouth daily. 90 tablet 0  . QUEtiapine (SEROQUEL) 25 MG tablet Take 1 tablet at bedtime 90 tablet 1  . ranitidine (ZANTAC) 300 MG tablet Take 1 tablet (300 mg total) by mouth 2 (two) times daily. 180 tablet 1  . warfarin (COUMADIN) 2 MG tablet TAKE AS DIRECTED BY ANTICOAGULATION CLINIC 35 tablet 3   No current facility-administered medications on file prior  to visit.     ROS: all negative except above.   Physical Exam: Filed Weights   08/23/16 1407  Weight: 124 lb (56.2 kg)   BP 124/76   Pulse 72   Temp 98.2 F (36.8 C) (Temporal)   Resp 14   Ht 5' 1.5" (1.562 m)   Wt 124 lb (56.2 kg)   SpO2 95%   BMI 23.05 kg/m  General Appearance: Well developed well nourished, non-toxic appearing in no apparent distress. Eyes: PERRLA, EOMs, conjunctiva w/ no swelling or erythema or discharge Sinuses: No Frontal/maxillary tenderness ENT/Mouth: Ear canals clear without swelling or erythema.  TM's normal bilaterally with no retractions, bulging, or loss of landmarks.   Neck: Supple, thyroid normal, no notable JVD  Respiratory: Respiratory effort normal, Clear breath sounds anteriorly and posteriorly bilaterally without rales, rhonchi, wheezing or stridor. No  retractions or accessory muscle usage. Cardio: Ireg ireg with no MRGs.   Abdomen: Soft, + BS.  Non tender, no guarding, rebound, hernias, masses.  Musculoskeletal: Full ROM, 5/5 strength, normal gait.  Skin: Warm, dry without rashes  Neuro: Awake and oriented X 2, Cranial nerves intact. Normal muscle tone, no cerebellar symptoms. Sensation intact. Struggles with expressive aphasia.  Psych: normal affect, Insight and Judgment appropriate.     Terri Piedra, PA-C 2:31 PM [facility]

## 2016-08-23 NOTE — Patient Instructions (Signed)
Please take levaquin once daily until it is gone.  Please cut your usual coumadin dose in half only while taking the antibiotic.  The coumadin clinic will know you are taking this medication.

## 2016-08-24 LAB — URINALYSIS, ROUTINE W REFLEX MICROSCOPIC
Bilirubin Urine: NEGATIVE
Glucose, UA: NEGATIVE
Hgb urine dipstick: NEGATIVE
Ketones, ur: NEGATIVE
Leukocytes, UA: NEGATIVE
Nitrite: NEGATIVE
Protein, ur: NEGATIVE
Specific Gravity, Urine: 1.01 (ref 1.001–1.035)
pH: 5 (ref 5.0–8.0)

## 2016-08-24 LAB — URINE CULTURE: Organism ID, Bacteria: NO GROWTH

## 2016-09-19 ENCOUNTER — Ambulatory Visit (INDEPENDENT_AMBULATORY_CARE_PROVIDER_SITE_OTHER): Payer: Medicare Other | Admitting: *Deleted

## 2016-09-19 DIAGNOSIS — I4891 Unspecified atrial fibrillation: Secondary | ICD-10-CM | POA: Diagnosis not present

## 2016-09-19 DIAGNOSIS — Z79899 Other long term (current) drug therapy: Secondary | ICD-10-CM | POA: Diagnosis not present

## 2016-09-19 LAB — POCT INR: INR: 2.4

## 2016-09-20 ENCOUNTER — Ambulatory Visit (INDEPENDENT_AMBULATORY_CARE_PROVIDER_SITE_OTHER): Payer: Medicare Other | Admitting: Internal Medicine

## 2016-09-20 ENCOUNTER — Encounter: Payer: Self-pay | Admitting: Internal Medicine

## 2016-09-20 VITALS — BP 110/76 | HR 72 | Temp 97.5°F | Resp 16 | Ht 61.5 in | Wt 126.0 lb

## 2016-09-20 DIAGNOSIS — I10 Essential (primary) hypertension: Secondary | ICD-10-CM

## 2016-09-20 DIAGNOSIS — E782 Mixed hyperlipidemia: Secondary | ICD-10-CM

## 2016-09-20 DIAGNOSIS — K219 Gastro-esophageal reflux disease without esophagitis: Secondary | ICD-10-CM

## 2016-09-20 DIAGNOSIS — E559 Vitamin D deficiency, unspecified: Secondary | ICD-10-CM | POA: Diagnosis not present

## 2016-09-20 DIAGNOSIS — I481 Persistent atrial fibrillation: Secondary | ICD-10-CM

## 2016-09-20 DIAGNOSIS — N182 Chronic kidney disease, stage 2 (mild): Secondary | ICD-10-CM

## 2016-09-20 DIAGNOSIS — E1122 Type 2 diabetes mellitus with diabetic chronic kidney disease: Secondary | ICD-10-CM | POA: Diagnosis not present

## 2016-09-20 DIAGNOSIS — Z79899 Other long term (current) drug therapy: Secondary | ICD-10-CM | POA: Diagnosis not present

## 2016-09-20 DIAGNOSIS — I4819 Other persistent atrial fibrillation: Secondary | ICD-10-CM

## 2016-09-20 LAB — CBC WITH DIFFERENTIAL/PLATELET
BASOS PCT: 1 %
Basophils Absolute: 69 cells/uL (ref 0–200)
EOS PCT: 2 %
Eosinophils Absolute: 138 cells/uL (ref 15–500)
HEMATOCRIT: 45 % (ref 35.0–45.0)
HEMOGLOBIN: 15.3 g/dL (ref 11.7–15.5)
LYMPHS ABS: 3036 {cells}/uL (ref 850–3900)
Lymphocytes Relative: 44 %
MCH: 32.1 pg (ref 27.0–33.0)
MCHC: 34 g/dL (ref 32.0–36.0)
MCV: 94.3 fL (ref 80.0–100.0)
MONO ABS: 621 {cells}/uL (ref 200–950)
MPV: 10 fL (ref 7.5–12.5)
Monocytes Relative: 9 %
Neutro Abs: 3036 cells/uL (ref 1500–7800)
Neutrophils Relative %: 44 %
Platelets: 201 10*3/uL (ref 140–400)
RBC: 4.77 MIL/uL (ref 3.80–5.10)
RDW: 13.6 % (ref 11.0–15.0)
WBC: 6.9 10*3/uL (ref 3.8–10.8)

## 2016-09-20 LAB — LIPID PANEL
CHOLESTEROL: 215 mg/dL — AB (ref <200)
HDL: 45 mg/dL — AB (ref >50)
LDL CALC: 132 mg/dL — AB (ref <100)
TRIGLYCERIDES: 188 mg/dL — AB (ref <150)
Total CHOL/HDL Ratio: 4.8 Ratio (ref <5.0)
VLDL: 38 mg/dL — ABNORMAL HIGH (ref <30)

## 2016-09-20 LAB — BASIC METABOLIC PANEL WITH GFR
BUN: 16 mg/dL (ref 7–25)
CALCIUM: 9.4 mg/dL (ref 8.6–10.4)
CO2: 26 mmol/L (ref 20–31)
Chloride: 104 mmol/L (ref 98–110)
Creat: 1.07 mg/dL — ABNORMAL HIGH (ref 0.60–0.88)
GFR, EST NON AFRICAN AMERICAN: 46 mL/min — AB (ref >=60)
GFR, Est African American: 53 mL/min — ABNORMAL LOW (ref >=60)
Glucose, Bld: 105 mg/dL — ABNORMAL HIGH (ref 65–99)
POTASSIUM: 3.8 mmol/L (ref 3.5–5.3)
SODIUM: 140 mmol/L (ref 135–146)

## 2016-09-20 LAB — MAGNESIUM: Magnesium: 1.9 mg/dL (ref 1.5–2.5)

## 2016-09-20 LAB — TSH: TSH: 0.85 mIU/L

## 2016-09-20 LAB — HEPATIC FUNCTION PANEL
ALK PHOS: 112 U/L (ref 33–130)
ALT: 13 U/L (ref 6–29)
AST: 19 U/L (ref 10–35)
Albumin: 3.9 g/dL (ref 3.6–5.1)
BILIRUBIN DIRECT: 0.1 mg/dL (ref <=0.2)
BILIRUBIN INDIRECT: 0.6 mg/dL (ref 0.2–1.2)
BILIRUBIN TOTAL: 0.7 mg/dL (ref 0.2–1.2)
Total Protein: 6.4 g/dL (ref 6.1–8.1)

## 2016-09-20 NOTE — Patient Instructions (Signed)

## 2016-09-20 NOTE — Progress Notes (Signed)
This very nice 83 y.o. MWF presents for 6 month follow up with Hypertension, Hyperlipidemia, Pre-Diabetes and Vitamin D Deficiency.      Patient is treated for HTN (1992)  & BP has been controlled at home. Today's BP is at goal - 110/76. Patient had an Embolic CVA in 2011 and was dx'd w/Afib since followed at the [facility]. She continues f/u with Dr Delton See.  She persists wit residual anomia and poor short term recall and uses a walker to stabilize her unsteady gait. She denies any falls. Patient has had no complaints of any cardiac type chest pain, palpitations, dyspnea/orthopnea/PND, dizziness, claudication, or dependent edema.     Hyperlipidemia is controlled with diet & meds. Patient denies myalgias or other med SE's. Last Lipids were at goal: Lab Results  Component Value Date   CHOL 137 03/16/2016   HDL 46 03/16/2016   LDLCALC 63 03/16/2016   TRIG 141 03/16/2016   CHOLHDL 3.0 03/16/2016      Also, the patient has history of T2_NIDDM (2003) with CKD3 and has had no symptoms of reactive hypoglycemia, diabetic polys, paresthesias or visual blurring.  Compliance has been marginal with last A1c was not at goal: Lab Results  Component Value Date   HGBA1C 6.5 (H) 03/16/2016      Further, the patient also has history of Vitamin D Deficiency ("35" in 2011)  and supplements vitamin D without any suspected side-effects. Last vitamin D was still slightly low (goal 70-100):  Lab Results  Component Value Date   VD25OH 53 03/16/2016   Current Outpatient Prescriptions on File Prior to Visit  Medication Sig  . acetaminophen325 MG tablet Take 325-650 mg  every 4  hrs as needed   . VITAMIN D 2000 UNITS  Take 2,000 Units  daily.   Marland Kitchen dicyclomine  20 MG  Take 1 tablet 3 x day if needed  . furosemide 20 MG  Take 1 tab daily.  Marland Kitchen losartan  50 MG  Take 1 tablet in the morning. If BP>140/90 give another  . MAGNESIA Take 1 tab daily.   . metoprolol tart 25 MG  Take 0.5 tablets  2 x daily.    Marland Kitchen nystatin cream (MYCOSTATIN) Apply 1 application topically 4  times daily. Apply to affected area every 4-6 hours x 10 days  . pantoprazole  40 MG  TAKE ONE TAB ONCE DAILY  . K-DU) 10 MEQ tablet Take 1 tab daily.  . QUEtiapine  25 MG tablet Take 1 tab at bedtime  . ranitidine  300 MG tablet Take 1 tab 2  times daily.  Marland Kitchen warfarin  2 MG tablet TAKE AS DIRECTED BY COAG CLINIC   Allergies  Allergen Reactions  . Ace Inhibitors     Cough  . Fosamax [Alendronate Sodium]     Heart burn  . Norvasc [Amlodipine Besylate]     edema  . Zocor [Simvastatin]     Elevated CPK   PMHx:   Past Medical History:  Diagnosis Date  . Anxiety   . Aphasia as late effect of cerebrovascular accident   . Atrial fibrillation (HCC)   . CVA (cerebral infarction)   . Diabetes mellitus type II   . GERD (gastroesophageal reflux disease)   . Hiatal hernia   . HTN (hypertension)   . Hyperlipidemia   . IBS (irritable bowel syndrome)   . Stroke (HCC)   . Vitamin D deficiency    Immunization History  Administered Date(s)  Administered  . DT 02/18/2015  . Influenza, High Dose Seasonal PF 03/05/2014, 02/18/2015, 03/16/2016  . Influenza-Unspecified 01/30/2013  . Pneumococcal-Unspecified 04/27/2004  . Td 06/28/2003  . Zoster 04/27/2009   Past Surgical History:  Procedure Laterality Date  . CATARACT EXTRACTION, BILATERAL    . CHOLECYSTECTOMY    . COLONOSCOPY  2005  . TONSILLECTOMY    . UPPER GASTROINTESTINAL ENDOSCOPY  2005   FHx:    Reviewed / unchanged  SHx:    Reviewed / unchanged  Systems Review:  Constitutional: Denies fever, chills, wt changes, headaches, insomnia, fatigue, night sweats, change in appetite. Eyes: Denies redness, blurred vision, diplopia, discharge, itchy, watery eyes.  ENT: Denies discharge, congestion, post nasal drip, epistaxis, sore throat, earache, hearing loss, dental pain, tinnitus, vertigo, sinus pain, snoring.  CV: Denies chest pain, palpitations, irregular heartbeat,  syncope, dyspnea, diaphoresis, orthopnea, PND, claudication or edema. Respiratory: denies cough, dyspnea, DOE, pleurisy, hoarseness, laryngitis, wheezing.  Gastrointestinal: Denies dysphagia, odynophagia, heartburn, reflux, water brash, abdominal pain or cramps, nausea, vomiting, bloating, diarrhea, constipation, hematemesis, melena, hematochezia  or hemorrhoids. Genitourinary: Denies dysuria, frequency, urgency, nocturia, hesitancy, discharge, hematuria or flank pain. Musculoskeletal: Denies arthralgias, myalgias, stiffness, jt. swelling, pain, limping or strain/sprain.  Skin: Denies pruritus, rash, hives, warts, acne, eczema or change in skin lesion(s). Neuro: No weakness, tremor, incoordination, spasms, paresthesia or pain. Psychiatric: Denies confusion, memory loss or sensory loss. Endo: Denies change in weight, skin or hair change.  Heme/Lymph: No excessive bleeding, bruising or enlarged lymph nodes.  Physical Exam  BP 110/76   Pulse 72   Temp 97.5 F (36.4 C)   Resp 16   Ht 5' 1.5" (1.562 m)   Wt 126 lb (57.2 kg)   BMI 23.42 kg/m   Appears well nourished, well groomed  and in no distress.  Eyes: PERRLA, EOMs, conjunctiva no swelling or erythema. Sinuses: No frontal/maxillary tenderness ENT/Mouth: EAC's clear, TM's nl w/o erythema, bulging. Nares clear w/o erythema, swelling, exudates. Oropharynx clear without erythema or exudates. Oral hygiene is good. Tongue normal, non obstructing. Hearing intact.  Neck: Supple. Thyroid nl. Car 2+/2+ without bruits, nodes or JVD. Chest: Respirations nl with BS clear & equal w/o rales, rhonchi, wheezing or stridor.  Cor: Heart sounds soft w/ irregular rate and rhythm without sig. murmurs, gallops, clicks or rubs. Peripheral pulses normal and equal  without edema.  Abdomen: Soft & bowel sounds normal. Non-tender w/o guarding, rebound, hernias, masses or organomegaly.  Lymphatics: Unremarkable.  Musculoskeletal: Full ROM all peripheral  extremities, joint stability, 5/5 strength and normal gait.  Skin: Warm, dry without exposed rashes, lesions or ecchymosis apparent.  Neuro: Cranial nerves intact, reflexes equal bilaterally. Sensory-motor testing grossly intact. Tendon reflexes grossly intact.  Pysch: Alert & oriented x 3.  Insight and judgement nl & appropriate. No ideations.  Assessment and Plan:  1. Essential hypertension  - Continue medication, monitor blood pressure at home.  - Continue DASH diet. Reminder to go to the ER if any CP,  SOB, nausea, dizziness, severe HA, changes vision/speech,  left arm numbness and tingling and jaw pain.  - CBC with Differential/Platelet - BASIC METABOLIC PANEL WITH GFR - Magnesium - TSH  2. Mixed hyperlipidemia  - Continue diet/meds, exercise,& lifestyle modifications.  - Continue monitor periodic cholesterol/liver & renal functions   - Hepatic function panel - Lipid panel - TSH  3. T2_NIDDM w/CKD2  (HCC)  - Continue diet, exercise, lifestyle modifications.  - Monitor appropriate labs.  - Hemoglobin A1c - Insulin, random  4. Vitamin D deficiency  - Continue supplementation.  - VITAMIN D 25 Hydroxy  5. Persistent atrial fibrillation (HCC)   6. Gastroesophageal reflux disease   7. Medication management  - CBC with Differential/Platelet - BASIC METABOLIC PANEL WITH GFR - Hepatic function panel - Magnesium - Lipid panel - TSH - Hemoglobin A1c - Insulin, random - VITAMIN D 25 Hydroxyl      Discussed  regular exercise, BP monitoring, weight control to achieve/maintain BMI less than 25 and discussed med and SE's. Recommended labs to assess and monitor clinical status with further disposition pending results of labs. Over 30 minutes of exam, counseling, chart review was performed.

## 2016-09-21 LAB — VITAMIN D 25 HYDROXY (VIT D DEFICIENCY, FRACTURES): Vit D, 25-Hydroxy: 49 ng/mL (ref 30–100)

## 2016-09-21 LAB — HEMOGLOBIN A1C
Hgb A1c MFr Bld: 6 % — ABNORMAL HIGH (ref <5.7)
Mean Plasma Glucose: 126 mg/dL

## 2016-09-21 LAB — INSULIN, RANDOM: Insulin: 10.1 u[IU]/mL (ref 2.0–19.6)

## 2016-10-17 ENCOUNTER — Other Ambulatory Visit: Payer: Self-pay | Admitting: Internal Medicine

## 2016-10-17 DIAGNOSIS — I5032 Chronic diastolic (congestive) heart failure: Secondary | ICD-10-CM

## 2016-10-17 DIAGNOSIS — I10 Essential (primary) hypertension: Secondary | ICD-10-CM

## 2016-10-31 ENCOUNTER — Ambulatory Visit (INDEPENDENT_AMBULATORY_CARE_PROVIDER_SITE_OTHER): Payer: Medicare Other | Admitting: *Deleted

## 2016-10-31 DIAGNOSIS — Z79899 Other long term (current) drug therapy: Secondary | ICD-10-CM

## 2016-10-31 DIAGNOSIS — I4891 Unspecified atrial fibrillation: Secondary | ICD-10-CM

## 2016-10-31 LAB — POCT INR: INR: 2.2

## 2016-12-12 ENCOUNTER — Ambulatory Visit (INDEPENDENT_AMBULATORY_CARE_PROVIDER_SITE_OTHER): Payer: Medicare Other | Admitting: *Deleted

## 2016-12-12 DIAGNOSIS — Z79899 Other long term (current) drug therapy: Secondary | ICD-10-CM | POA: Diagnosis not present

## 2016-12-12 DIAGNOSIS — I4891 Unspecified atrial fibrillation: Secondary | ICD-10-CM | POA: Diagnosis not present

## 2016-12-12 LAB — POCT INR: INR: 3

## 2016-12-25 DIAGNOSIS — E1122 Type 2 diabetes mellitus with diabetic chronic kidney disease: Secondary | ICD-10-CM | POA: Insufficient documentation

## 2016-12-25 DIAGNOSIS — E119 Type 2 diabetes mellitus without complications: Secondary | ICD-10-CM | POA: Insufficient documentation

## 2016-12-25 DIAGNOSIS — E1140 Type 2 diabetes mellitus with diabetic neuropathy, unspecified: Secondary | ICD-10-CM

## 2016-12-25 NOTE — Progress Notes (Signed)
MEDICARE ANNUAL WELLNESS VISIT AND FOLLOW UP  Assessment:    Medicare annual wellness visit, subsequent Due next year  Essential hypertension - continue medications BUT MONITOR AT HOME, WANT HIGHER BP DUE TO AGE, DASH diet, exercise and monitor at home. Call if greater than 130/80.  -     CBC with Differential/Platelet -     BASIC METABOLIC PANEL WITH GFR -     Hepatic function panel -     TSH  Hyperlipidemia -     Lipid panel -- continue medications, check lipids, decrease fatty foods, increase activity.   Type 2 diabetes mellitus with stage 2 chronic kidney disease, without long-term current use of insulin (HCC) -     Hemoglobin A1c -  Discussed general issues about diabetes pathophysiology and management., Educational material distributed., Suggested low cholesterol diet., Encouraged aerobic exercise., Discussed foot care., Reminded to get yearly retinal exam.  Atrial fibrillation, unspecified type (HCC)  - on coumadin continue follow cards - may need to stop at some point due to age, risk/benefits, husband wants to talk with cardiology  Diastolic congestive heart failure, unspecified congestive heart failure chronicity (HCC)  - weight stable, continue medications, follow cards  Vitamin D deficiency  Medication management -     Magnesium  APHASIA DUE TO CEREBROVASCULAR DISEASE  -Control blood pressure, cholesterol, glucose, increase exercise.   Cerebrovascular accident (CVA) due to embolism of precerebral artery (HCC)  -Control blood pressure, cholesterol, glucose, increase exercise.   Anxiety state  - controlled  Gastroesophageal reflux disease, esophagitis presence not specified  -Continue PPI/H2 blocker, diet discussed  Type 2 diabetes mellitus with diabetic neuropathy, without long-term current use of insulin (HCC)  - Discussed general issues about diabetes pathophysiology and management., Educational material distributed., Suggested low cholesterol diet.,  Encouraged aerobic exercise., Discussed foot care., Reminded to get yearly retinal exam.  Atherosclerosis of Aorta  -  Control blood pressure, cholesterol, glucose, increase exercise.   Advanced care planning Has living will, discussed DNR, declines at this time Given information  Urinary frequency Check urine  Over 30 minutes of exam, counseling, chart review, and critical decision making was performed  Future Appointments Date Time Provider Department Center  01/09/2017 11:45 AM [facility]  04/18/2017 10:00 AM Lucky CowboyMcKeown, William, MD [facility] None    Plan:   During the course of the visit the patient was educated and counseled about appropriate screening and preventive services including:    Pneumococcal vaccine   Influenza vaccine  Td vaccine  Prevnar 13  Screening electrocardiogram  Screening mammography  Bone densitometry screening  Colorectal cancer screening  Diabetes screening  Glaucoma screening  Nutrition counseling   Advanced directives: given info/requested copies   Subjective:   Lisa Hopkins is a 83 y.o. female who presents for Medicare Annual Wellness Visit and 3 month follow up on hypertension, diabetes, hyperlipidemia, vitamin D def.   Her blood pressure has been controlled at home, today their BP is BP: 120/74  She has afib, on coumadin, has history of CVA with poor ST recall and dysomnia. Follows with cardiology, is moderate-high fall risk. She has had more imbalance lately but has had burning with urinary x Sunday.  She has history of diastolic heart failure is on lasix and weight is stable.   She does not workout. She denies chest pain, shortness of breath, dizziness.   She is on cholesterol medication and denies myalgias. Her cholesterol is at goal. The cholesterol last visit was:  Lab Results  Component Value Date   CHOL 215 (H) 09/20/2016   HDL 45 (L) 09/20/2016   LDLCALC 132 (H)  09/20/2016   TRIG 188 (H) 09/20/2016   CHOLHDL 4.8 09/20/2016   She has been working on diet and exercise for Diabetes with diabetic chronic kidney disease, she is not on bASA, she is not on ACE/ARB, and denies polydipsia and polyuria. Last A1C was:  Lab Results  Component Value Date   HGBA1C 6.0 (H) 09/20/2016   Lab Results  Component Value Date   GFRNONAA 46 (L) 09/20/2016    Patient is on Vitamin D supplement.   Lab Results  Component Value Date   VD25OH 49 09/20/2016     BMI is Body mass index is 22.72 kg/m., she is working on diet and exercise. Wt Readings from Last 3 Encounters:  12/26/16 122 lb 3.2 oz (55.4 kg)  09/20/16 126 lb (57.2 kg)  08/23/16 124 lb (56.2 kg)    Medication Review Current Outpatient Prescriptions on File Prior to Visit  Medication Sig  . acetaminophen (TYLENOL) 325 MG tablet Take 325-650 mg by mouth every 4 (four) hours as needed for mild pain, moderate pain or headache.   . Cholecalciferol (VITAMIN D) 2000 UNITS CAPS Take 2,000 Units by mouth daily.   Marland Kitchen dicyclomine (BENTYL) 20 MG tablet Take 1 tablet 3 x day if needed for nausea, bloating , cramping or diarrhea  . furosemide (LASIX) 20 MG tablet Take 1 tablet (20 mg total) by mouth daily.  Marland Kitchen losartan (COZAAR) 50 MG tablet Take 1 tablet in the morning.  Check blood pressure at lunch.  If blood pressure is over 140/90 please give another tablet.  . Magnesium Hydroxide (MAGNESIA PO) Take 1 tablet by mouth daily.   Marland Kitchen nystatin cream (MYCOSTATIN) Apply 1 application topically 4 (four) times daily. Apply to affected area every 4-6 hours x 10 days  . pantoprazole (PROTONIX) 40 MG tablet TAKE ONE TABLET BY MOUTH ONCE DAILY FOR  ACID  INDIGESTION  . potassium chloride (K-DUR) 10 MEQ tablet TAKE ONE TABLET BY MOUTH DAILY  . QUEtiapine (SEROQUEL) 25 MG tablet Take 1 tablet at bedtime  . ranitidine (ZANTAC) 300 MG tablet Take 1 tablet (300 mg total) by mouth 2 (two) times daily.  Marland Kitchen warfarin (COUMADIN) 2 MG  tablet TAKE AS DIRECTED BY ANTICOAGULATION CLINIC  . metoprolol tartrate (LOPRESSOR) 25 MG tablet Take 0.5 tablets (12.5 mg total) by mouth 2 (two) times daily.   No current facility-administered medications on file prior to visit.     Allergies: Allergies  Allergen Reactions  . Ace Inhibitors     Cough  . Fosamax [Alendronate Sodium]     Heart burn  . Norvasc [Amlodipine Besylate]     edema  . Zocor [Simvastatin]     Elevated CPK    Current Problems (verified) has T2_NIDDM w/ CKD2 (GFR 64 ml/min); Anxiety state; Essential hypertension; Atrial fibrillation (HCC); CVA ; APHASIA DUE TO CEREBROVASCULAR DISEASE; Hyperlipidemia; Vitamin D deficiency; Medication management; Diastolic CHF (HCC); GERD; Medicare annual wellness visit, subsequent; Bursitis of hip; Atherosclerosis of aorta (HCC); and Type 2 diabetes mellitus with diabetic neuropathy, without long-term current use of insulin (HCC) on her problem list.  Screening Tests Immunization History  Administered Date(s) Administered  . DT 02/18/2015  . Influenza, High Dose Seasonal PF 03/05/2014, 02/18/2015, 03/16/2016  . Influenza-Unspecified 01/30/2013  . Pneumococcal-Unspecified 04/27/2004  . Td 06/28/2003  . Zoster 04/27/2009    Preventative care: Last colonoscopy: 2007, will  not get another due to age Last mammogram: 03/2015 Last pap smear/pelvic exam: remote   DEXA:Jan 17 2008 Echo 2011  Prior vaccinations: TD or Tdap: 2016  Influenza: 2017 Pneumococcal: 2005 Prevnar13: DUE, declines today Shingles/Zostavax: 2010  Names of Other Physician/Practitioners you currently use: 1. [facility]- here for primary care 2. Elmer Picker, eye doctor, last visit July 2017 has appointment next month 3. Bentson, dentist, last visit July 2018 Patient Care Team: Lucky Cowboy, MD as PCP - General (Internal Medicine) Mateo Flow, MD as Consulting Physician (Ophthalmology) Lars Masson,  MD as Consulting Physician (Cardiology) Bernette Redbird, MD as Consulting Physician (Gastroenterology) Cherlyn Roberts, MD as Consulting Physician (Dermatology) Richardean Chimera, MD as Consulting Physician (Obstetrics and Gynecology)  Surgical: She  has a past surgical history that includes Upper gastrointestinal endoscopy (2005); Colonoscopy (2005); Cholecystectomy; Cataract extraction, bilateral; and Tonsillectomy. Family Her family history includes Arthritis in her sister and sister; Diabetes in her mother; Heart disease in her brother; Hypertension in her brother; Stroke in her mother. Social history  She reports that she has never smoked. She has never used smokeless tobacco. She reports that she does not drink alcohol or use drugs.  MEDICARE WELLNESS OBJECTIVES: Physical activity: Current Exercise Habits: The patient does not participate in regular exercise at present Cardiac risk factors: Cardiac Risk Factors include: advanced age (>65men, >81 women);diabetes mellitus;dyslipidemia;hypertension;sedentary lifestyle Depression/mood screen:   Depression screen [facility] 2/9 12/26/2016  Decreased Interest 0  Down, Depressed, Hopeless 0  PHQ - 2 Score 0    ADLs:  In your present state of health, do you have any difficulty performing the following activities: 12/26/2016 09/20/2016  Hearing? Y N  Comment - -  Vision? N N  Difficulty concentrating or making decisions? Y N  Comment - -  Walking or climbing stairs? Y N  Comment - -  Dressing or bathing? Y N  Doing errands, shopping? Y N  Comment - -  Quarry manager and eating ? Y -  Using the Toilet? Y -  In the past six months, have you accidently leaked urine? Y -  Do you have problems with loss of bowel control? N -  Managing your Medications? Y -  Managing your Finances? Y -  Housekeeping or managing your Housekeeping? Y -  Some recent data might be hidden    Cognitive Testing  Alert? Yes  Normal Appearance?Yes  Oriented to person?  Yes  Place? Yes   Time? Yes  Recall of three objects?  1/3  Can perform simple calculations? no  Displays appropriate judgment? No, looks to husband to make choices  Can read the correct time from a watch face?Yes  EOL planning: Does Patient Have a Medical Advance Directive?: Yes Type of Advance Directive: Healthcare Power of Attorney, Living will Does patient want to make changes to medical advance directive?: No - Patient declined Copy of Healthcare Power of Attorney in Chart?: Yes   Objective:   Today's Vitals   12/26/16 1125  BP: 120/74  Pulse: 67  Temp: (!) 97.3 F (36.3 C)  SpO2: 98%  Weight: 122 lb 3.2 oz (55.4 kg)  Height: 5' 1.5" (1.562 m)  PainSc: 0-No pain   Body mass index is 22.72 kg/m.  General appearance: alert, no distress, WD/WN,  female HEENT: normocephalic, sclerae anicteric, TMs pearly, nares patent, no discharge or erythema, pharynx normal Oral cavity: MMM, no lesions Neck: supple, no lymphadenopathy, no thyromegaly, no masses Heart: Irreg irreg, normal S1, S2, no  murmurs Lungs: CTA bilaterally, no wheezes, rhonchi, or rales Abdomen: +bs, soft, non tender, non distended, no masses, no hepatomegaly, no splenomegaly Musculoskeletal: nontender, no swelling, no obvious deformity Extremities: mild edema right leg, no warmth/redness/swelling, no cyanosis, no clubbing Pulses: 2+ symmetric, upper and lower extremities, normal cap refill Neurological: alert, oriented x 3, CN2-12 intact, strength normal upper extremities and lower extremities, sensation decreased bilateral feet to mid shin DTRs 2+ throughout, no cerebellar signs, gait normal Psychiatric: normal affect, behavior normal, anomia, pleasant  Breast: defer Gyn: defer Rectal: defer  Diabetic Foot Exam - Simple   Simple Foot Form Visual Inspection No deformities, no ulcerations, no other skin breakdown bilaterally:  Yes Sensation Testing See comments:  Yes Pulse Check See comments:   Yes Comments DP reduced bilateral and reduced sensation at bilateral feet to ankles      Medicare Attestation I have personally reviewed: The patient's medical and social history Their use of alcohol, tobacco or illicit drugs Their current medications and supplements The patient's functional ability including ADLs,fall risks, home safety risks, cognitive, and hearing and visual impairment Diet and physical activities Evidence for depression or mood disorders  The patient's weight, height, BMI, and visual acuity have been recorded in the chart.  I have made referrals, counseling, and provided education to the patient based on review of the above and I have provided the patient with a written personalized care plan for preventive services.     Quentin Mullingmanda Annamaria Salah, PA-C   12/26/2016

## 2016-12-26 ENCOUNTER — Encounter: Payer: Self-pay | Admitting: Physician Assistant

## 2016-12-26 ENCOUNTER — Ambulatory Visit (INDEPENDENT_AMBULATORY_CARE_PROVIDER_SITE_OTHER): Payer: Medicare Other | Admitting: Physician Assistant

## 2016-12-26 VITALS — BP 120/74 | HR 67 | Temp 97.3°F | Ht 61.5 in | Wt 122.2 lb

## 2016-12-26 DIAGNOSIS — R350 Frequency of micturition: Secondary | ICD-10-CM

## 2016-12-26 DIAGNOSIS — E782 Mixed hyperlipidemia: Secondary | ICD-10-CM | POA: Diagnosis not present

## 2016-12-26 DIAGNOSIS — Z0001 Encounter for general adult medical examination with abnormal findings: Secondary | ICD-10-CM | POA: Diagnosis not present

## 2016-12-26 DIAGNOSIS — E559 Vitamin D deficiency, unspecified: Secondary | ICD-10-CM

## 2016-12-26 DIAGNOSIS — E1122 Type 2 diabetes mellitus with diabetic chronic kidney disease: Secondary | ICD-10-CM | POA: Diagnosis not present

## 2016-12-26 DIAGNOSIS — Z7189 Other specified counseling: Secondary | ICD-10-CM | POA: Diagnosis not present

## 2016-12-26 DIAGNOSIS — I700 Atherosclerosis of aorta: Secondary | ICD-10-CM

## 2016-12-26 DIAGNOSIS — N182 Chronic kidney disease, stage 2 (mild): Secondary | ICD-10-CM

## 2016-12-26 DIAGNOSIS — I639 Cerebral infarction, unspecified: Secondary | ICD-10-CM | POA: Diagnosis not present

## 2016-12-26 DIAGNOSIS — F411 Generalized anxiety disorder: Secondary | ICD-10-CM | POA: Diagnosis not present

## 2016-12-26 DIAGNOSIS — I5030 Unspecified diastolic (congestive) heart failure: Secondary | ICD-10-CM

## 2016-12-26 DIAGNOSIS — K219 Gastro-esophageal reflux disease without esophagitis: Secondary | ICD-10-CM

## 2016-12-26 DIAGNOSIS — I10 Essential (primary) hypertension: Secondary | ICD-10-CM | POA: Diagnosis not present

## 2016-12-26 DIAGNOSIS — Z79899 Other long term (current) drug therapy: Secondary | ICD-10-CM

## 2016-12-26 DIAGNOSIS — I69920 Aphasia following unspecified cerebrovascular disease: Secondary | ICD-10-CM | POA: Diagnosis not present

## 2016-12-26 DIAGNOSIS — R6889 Other general symptoms and signs: Secondary | ICD-10-CM

## 2016-12-26 DIAGNOSIS — I4891 Unspecified atrial fibrillation: Secondary | ICD-10-CM | POA: Diagnosis not present

## 2016-12-26 DIAGNOSIS — Z0000 Encounter for general adult medical examination without abnormal findings: Secondary | ICD-10-CM

## 2016-12-26 DIAGNOSIS — E1140 Type 2 diabetes mellitus with diabetic neuropathy, unspecified: Secondary | ICD-10-CM

## 2016-12-26 LAB — CBC WITH DIFFERENTIAL/PLATELET
BASOS ABS: 72 {cells}/uL (ref 0–200)
Basophils Relative: 1 %
EOS ABS: 216 {cells}/uL (ref 15–500)
EOS PCT: 3 %
HCT: 47.4 % — ABNORMAL HIGH (ref 35.0–45.0)
HEMOGLOBIN: 16 g/dL — AB (ref 11.7–15.5)
LYMPHS ABS: 3168 {cells}/uL (ref 850–3900)
Lymphocytes Relative: 44 %
MCH: 31.8 pg (ref 27.0–33.0)
MCHC: 33.8 g/dL (ref 32.0–36.0)
MCV: 94.2 fL (ref 80.0–100.0)
MONO ABS: 576 {cells}/uL (ref 200–950)
MPV: 9.8 fL (ref 7.5–12.5)
Monocytes Relative: 8 %
NEUTROS ABS: 3168 {cells}/uL (ref 1500–7800)
NEUTROS PCT: 44 %
Platelets: 224 10*3/uL (ref 140–400)
RBC: 5.03 MIL/uL (ref 3.80–5.10)
RDW: 13.4 % (ref 11.0–15.0)
WBC: 7.2 10*3/uL (ref 3.8–10.8)

## 2016-12-26 LAB — TSH: TSH: 0.68 m[IU]/L

## 2016-12-26 NOTE — Patient Instructions (Addendum)
Please monitor your blood pressure, as we get older our body can not respond to a low blood pressure as well as it did when we were younger, for this reason we want a bit higher of a blood pressure as you get older to avoid dizziness and fatigue which can lead to falls. [place] call if your blood pressure is consistently above 150/90.    Confusion Confusion is the inability to think with your usual speed or clarity. Confusion may come on quickly or slowly over time. How quickly the confusion comes on depends on the cause. Confusion can be due to any number of causes. What are the causes?  Concussion, head injury, or head trauma.  Seizures.  Stroke.  Fever.  Brain tumor.  Age related decreased brain function (dementia).  Heightened emotional states like rage or terror.  Mental illness in which the person loses the ability to determine what is real and what is not (hallucinations).  Infections such as a urinary tract infection (UTI).  Toxic effects from alcohol, drugs, or prescription medicines.  Dehydration and an imbalance of salts in the body (electrolytes).  Lack of sleep.  Low blood sugar (diabetes).  Low levels of oxygen from conditions such as chronic lung disorders.  Drug interactions or other medicine side effects.  Nutritional deficiencies, especially niacin, thiamine, vitamin C, or vitamin B.  Sudden drop in body temperature (hypothermia).  Change in routine, such as when traveling or hospitalized. What are the signs or symptoms? People often describe their thinking as cloudy or unclear when they are confused. Confusion can also include feeling disoriented. That means you are unaware of where or who you are. You may also not know what the date or time is. If confused, you may also have difficulty paying attention, remembering, and making decisions. Some people also act aggressively when they are confused. How is this diagnosed? The medical evaluation of confusion  may include:  Blood and urine tests.  X-rays.  Brain and nervous system tests.  Analyzing your brain waves (electroencephalogram or EEG).  Magnetic resonance imaging (MRI) of your head.  Computed tomography (CT) scan of your head.  Mental status tests in which your health care provider may ask many questions. Some of these questions may seem silly or strange, but they are a very important test to help diagnose and treat confusion.  How is this treated? An admission to the hospital may not be needed, but a person with confusion should not be left alone. Stay with a family member or friend until the confusion clears. Avoid alcohol, pain relievers, or sedative drugs until you have fully recovered. Do not drive until directed by your health care provider. Follow these instructions at home: What family and friends can do:  To find out if someone is confused, ask the person to state his or her name, age, and the date. If the person is unsure or answers incorrectly, he or she is confused.  Always introduce yourself, no matter how well the person knows you.  Often remind the person of his or her location.  Place a calendar and clock near the confused person.  Help the person with his or her medicines. You may want to use a pill box, an alarm as a reminder, or give the person each dose as prescribed.  Talk about current events and plans for the day.  Try to keep the environment calm, quiet, and peaceful.  Make sure the person keeps follow-up visits with his or her health  care provider.  How is this prevented? Ways to prevent confusion:  Avoid alcohol.  Eat a balanced diet.  Get enough sleep.  Take medicine only as directed by your health care provider.  Do not become isolated. Spend time with other people and make plans for your days.  Keep careful watch on your blood sugar levels if you are diabetic.  Get help right away if:  You develop severe headaches, repeated  vomiting, seizures, blackouts, or slurred speech.  There is increasing confusion, weakness, numbness, restlessness, or personality changes.  You develop a loss of balance, have marked dizziness, feel uncoordinated, or fall.  You have delusions, hallucinations, or develop severe anxiety.  Your family members think you need to be rechecked. This information is not intended to replace advice given to you by your health care provider. Make sure you discuss any questions you have with your health care provider. Document Released: 06/22/2004 Document Revised: 12/03/2015 Document Reviewed: 06/20/2013 Elsevier Interactive Patient Education  2018 [employer]  HOME CARE INSTRUCTIONS   Do not stand or sit in one position for long periods of time. Do not sit with your legs crossed. Rest with your legs raised during the day.  Your legs have to be higher than your heart so that gravity will force the valves to open, so please really elevate your legs.   Wear elastic stockings or support hose. Do not wear other tight, encircling garments around the legs, pelvis, or waist.  [employer]  has a wide variety of well priced compression stockings. [address], [place] [id number] [phone]  Walk as much as possible to increase blood flow.  Raise the foot of your bed at night with 2-inch blocks. SEEK MEDICAL CARE IF:   The skin around your ankle starts to break down.  You have pain, redness, tenderness, or hard swelling developing in your leg over a vein.  You are uncomfortable due to leg pain. Document Released: 02/22/2005 Document Revised: 08/07/2011 Document Reviewed: 07/11/2010 [facility] Patient Information 2014 [place]   Advance Directive Advance directives are legal documents that let you make choices ahead of time about your health care and medical treatment in case you become unable to communicate for yourself. Advance directives are a way for you to communicate  your wishes to family, friends, and health care providers. This can help convey your decisions about end-of-life care if you become unable to communicate. Discussing and writing advance directives should happen over time rather than all at once. Advance directives can be changed depending on your situation and what you want, even after you have signed the advance directives. If you do not have an advance directive, some states assign family decision makers to act on your behalf based on how closely you are related to them. Each state has its own laws regarding advance directives. You may want to check with your health care provider, attorney, or state representative about the laws in your state. There are different types of advance directives, such as:  Medical power of attorney.  Living will.  Do not resuscitate (DNR) or do not attempt resuscitation (DNAR) order.  Health care proxy and medical power of attorney A health care proxy, also called a health care agent, is a person who is appointed to make medical decisions for you in cases in which you are unable to make the decisions yourself. Generally, people choose someone they know well and trust to represent their preferences. Make sure to ask this  person for an agreement to act as your proxy. A proxy may have to exercise judgment in the event of a medical decision for which your wishes are not known. A medical power of attorney is a legal document that names your health care proxy. Depending on the laws in your state, after the document is written, it may also need to be:  Signed.  Notarized.  Dated.  Copied.  Witnessed.  Incorporated into your medical record.  You may also want to appoint someone to manage your financial affairs in a situation in which you are unable to do so. This is called a durable power of attorney for finances. It is a separate legal document from the durable power of attorney for health care. You may choose the  same person or someone different from your health care proxy to act as your agent in financial matters. If you do not appoint a proxy, or if there is a concern that the proxy is not acting in your best interests, a court-appointed guardian may be designated to act on your behalf. Living will A living will is a set of instructions documenting your wishes about medical care when you cannot express them yourself. Health care providers should keep a copy of your living will in your medical record. You may want to give a copy to family members or friends. To alert caregivers in case of an emergency, you can place a card in your wallet to let them know that you have a living will and where they can find it. A living will is used if you become:  Terminally ill.  Incapacitated.  Unable to communicate or make decisions.  Items to consider in your living will include:  The use or non-use of life-sustaining equipment, such as dialysis machines and breathing machines (ventilators).  A DNR or DNAR order, which is the instruction not to use cardiopulmonary resuscitation (CPR) if breathing or heartbeat stops.  The use or non-use of tube feeding.  Withholding of food and fluids.  Comfort (palliative) care when the goal becomes comfort rather than a cure.  Organ and tissue donation.  A living will does not give instructions for distributing your money and property if you should pass away. It is recommended that you seek the advice of a lawyer when writing a will. Decisions about taxes, beneficiaries, and asset distribution will be legally binding. This process can relieve your family and friends of any concerns surrounding disputes or questions that may come up about the distribution of your assets. DNR or DNAR A DNR or DNAR order is a request not to have CPR in the event that your heart stops beating or you stop breathing. If a DNR or DNAR order has not been made and shared, a health care provider will  try to help any patient whose heart has stopped or who has stopped breathing. If you plan to have surgery, talk with your health care provider about how your DNR or DNAR order will be followed if problems occur. Summary  Advance directives are the legal documents that allow you to make choices ahead of time about your health care and medical treatment in case you become unable to communicate for yourself.  The process of discussing and writing advance directives should happen over time. You can change the advance directives, even after you have signed them.  Advance directives include DNR or DNAR orders, living wills, and designating an agent as your medical power of attorney. This information is not  intended to replace advice given to you by your health care provider. Make sure you discuss any questions you have with your health care provider. Document Released: 08/22/2007 Document Revised: 04/03/2016 Document Reviewed: 04/03/2016 Elsevier Interactive Patient Education  2017 [employer]

## 2016-12-27 LAB — BASIC METABOLIC PANEL WITH GFR
BUN: 14 mg/dL (ref 7–25)
CHLORIDE: 103 mmol/L (ref 98–110)
CO2: 25 mmol/L (ref 20–31)
CREATININE: 1.05 mg/dL — AB (ref 0.60–0.88)
Calcium: 9.5 mg/dL (ref 8.6–10.4)
GFR, Est African American: 54 mL/min — ABNORMAL LOW (ref >=60)
GFR, Est Non African American: 47 mL/min — ABNORMAL LOW (ref >=60)
GLUCOSE: 99 mg/dL (ref 65–99)
Potassium: 4.4 mmol/L (ref 3.5–5.3)
SODIUM: 142 mmol/L (ref 135–146)

## 2016-12-27 LAB — MAGNESIUM: MAGNESIUM: 2 mg/dL (ref 1.5–2.5)

## 2016-12-27 LAB — URINALYSIS, ROUTINE W REFLEX MICROSCOPIC
Bilirubin Urine: NEGATIVE
Glucose, UA: NEGATIVE
Hgb urine dipstick: NEGATIVE
Ketones, ur: NEGATIVE
Leukocytes, UA: NEGATIVE
NITRITE: NEGATIVE
Protein, ur: NEGATIVE
SPECIFIC GRAVITY, URINE: 1.008 (ref 1.001–1.035)
pH: 5 (ref 5.0–8.0)

## 2016-12-27 LAB — HEPATIC FUNCTION PANEL
ALT: 14 U/L (ref 6–29)
AST: 19 U/L (ref 10–35)
Albumin: 4.1 g/dL (ref 3.6–5.1)
Alkaline Phosphatase: 120 U/L (ref 33–130)
BILIRUBIN DIRECT: 0.1 mg/dL (ref <=0.2)
Indirect Bilirubin: 0.5 mg/dL (ref 0.2–1.2)
TOTAL PROTEIN: 6.7 g/dL (ref 6.1–8.1)
Total Bilirubin: 0.6 mg/dL (ref 0.2–1.2)

## 2016-12-27 LAB — LIPID PANEL
CHOL/HDL RATIO: 5.2 ratio — AB (ref <5.0)
Cholesterol: 230 mg/dL — ABNORMAL HIGH (ref <200)
HDL: 44 mg/dL — ABNORMAL LOW (ref >50)
LDL Cholesterol: 149 mg/dL — ABNORMAL HIGH (ref <100)
Triglycerides: 186 mg/dL — ABNORMAL HIGH (ref <150)
VLDL: 37 mg/dL — AB (ref <30)

## 2016-12-27 LAB — HEMOGLOBIN A1C
HEMOGLOBIN A1C: 6.3 % — AB (ref <5.7)
MEAN PLASMA GLUCOSE: 134 mg/dL

## 2016-12-27 LAB — URINE CULTURE: ORGANISM ID, BACTERIA: NO GROWTH

## 2016-12-28 ENCOUNTER — Other Ambulatory Visit: Payer: Self-pay

## 2016-12-28 ENCOUNTER — Telehealth: Payer: Self-pay | Admitting: Physician Assistant

## 2016-12-28 ENCOUNTER — Other Ambulatory Visit: Payer: Self-pay | Admitting: *Deleted

## 2016-12-28 MED ORDER — TRIAMCINOLONE ACETONIDE 0.5 % EX CREA: 1.0000 "application " | TOPICAL_CREAM | Freq: Two times a day (BID) | CUTANEOUS | 2 refills | Status: DC

## 2016-12-28 MED ORDER — NYSTATIN 100000 UNIT/GM EX CREA: 1.0000 "application " | TOPICAL_CREAM | Freq: Four times a day (QID) | CUTANEOUS | 0 refills | Status: DC

## 2016-12-28 MED ORDER — NYSTATIN 100000 UNIT/GM EX CREA: 1.0000 "application " | TOPICAL_CREAM | Freq: Four times a day (QID) | CUTANEOUS | 1 refills | Status: DC

## 2016-12-28 NOTE — Progress Notes (Signed)
Pt aware of lab results & voiced understanding of those results. Pt reports some rectal burning since Sunday. No bleeding. Please advise

## 2016-12-28 NOTE — Progress Notes (Signed)
Pt aware of lab results & voiced understanding of those results.

## 2016-12-28 NOTE — Telephone Encounter (Signed)
-----   Message from Gregery NaAngela D Duff, CMA sent at 12/28/2016  9:41 AM EDT ----- Pt aware of lab results & voiced understanding of those results. Pt reports some rectal burning since Sunday. No bleeding. Please advise

## 2017-01-02 ENCOUNTER — Other Ambulatory Visit: Payer: Self-pay | Admitting: Internal Medicine

## 2017-01-09 ENCOUNTER — Ambulatory Visit (INDEPENDENT_AMBULATORY_CARE_PROVIDER_SITE_OTHER): Payer: Medicare Other

## 2017-01-09 DIAGNOSIS — I639 Cerebral infarction, unspecified: Secondary | ICD-10-CM | POA: Diagnosis not present

## 2017-01-09 DIAGNOSIS — I4891 Unspecified atrial fibrillation: Secondary | ICD-10-CM

## 2017-01-09 DIAGNOSIS — Z79899 Other long term (current) drug therapy: Secondary | ICD-10-CM | POA: Diagnosis not present

## 2017-01-09 LAB — POCT INR: INR: 2.5

## 2017-01-23 ENCOUNTER — Other Ambulatory Visit: Payer: Self-pay | Admitting: Internal Medicine

## 2017-02-06 ENCOUNTER — Ambulatory Visit (INDEPENDENT_AMBULATORY_CARE_PROVIDER_SITE_OTHER): Payer: Medicare Other

## 2017-02-06 DIAGNOSIS — Z79899 Other long term (current) drug therapy: Secondary | ICD-10-CM | POA: Diagnosis not present

## 2017-02-06 DIAGNOSIS — I4891 Unspecified atrial fibrillation: Secondary | ICD-10-CM | POA: Diagnosis not present

## 2017-02-06 DIAGNOSIS — I639 Cerebral infarction, unspecified: Secondary | ICD-10-CM

## 2017-02-06 LAB — POCT INR: INR: 3.2

## 2017-03-05 ENCOUNTER — Other Ambulatory Visit: Payer: Self-pay | Admitting: Internal Medicine

## 2017-03-05 ENCOUNTER — Ambulatory Visit (INDEPENDENT_AMBULATORY_CARE_PROVIDER_SITE_OTHER): Payer: Medicare Other | Admitting: *Deleted

## 2017-03-05 DIAGNOSIS — Z23 Encounter for immunization: Secondary | ICD-10-CM | POA: Diagnosis not present

## 2017-03-06 ENCOUNTER — Ambulatory Visit (INDEPENDENT_AMBULATORY_CARE_PROVIDER_SITE_OTHER): Payer: Medicare Other | Admitting: *Deleted

## 2017-03-06 DIAGNOSIS — Z79899 Other long term (current) drug therapy: Secondary | ICD-10-CM

## 2017-03-06 DIAGNOSIS — I639 Cerebral infarction, unspecified: Secondary | ICD-10-CM

## 2017-03-06 DIAGNOSIS — I4891 Unspecified atrial fibrillation: Secondary | ICD-10-CM

## 2017-03-06 DIAGNOSIS — Z5181 Encounter for therapeutic drug level monitoring: Secondary | ICD-10-CM | POA: Diagnosis not present

## 2017-03-06 LAB — POCT INR: INR: 2.6

## 2017-04-10 ENCOUNTER — Ambulatory Visit (INDEPENDENT_AMBULATORY_CARE_PROVIDER_SITE_OTHER): Payer: Medicare Other | Admitting: *Deleted

## 2017-04-10 DIAGNOSIS — I639 Cerebral infarction, unspecified: Secondary | ICD-10-CM

## 2017-04-10 DIAGNOSIS — Z79899 Other long term (current) drug therapy: Secondary | ICD-10-CM | POA: Diagnosis not present

## 2017-04-10 DIAGNOSIS — Z5181 Encounter for therapeutic drug level monitoring: Secondary | ICD-10-CM

## 2017-04-10 DIAGNOSIS — I4891 Unspecified atrial fibrillation: Secondary | ICD-10-CM | POA: Diagnosis not present

## 2017-04-10 LAB — POCT INR: INR: 3

## 2017-04-10 NOTE — Patient Instructions (Signed)
Continue  same dose of coumadin  1 pill everyday except 1/2 pill on Tuesdays, Thursdays, and Saturdays.  Recheck INR 4 weeks  Call with questions or any new medications. [phone]

## 2017-04-14 ENCOUNTER — Encounter (HOSPITAL_COMMUNITY): Payer: Self-pay | Admitting: Emergency Medicine

## 2017-04-14 ENCOUNTER — Ambulatory Visit (HOSPITAL_COMMUNITY)
Admission: EM | Admit: 2017-04-14 | Discharge: 2017-04-14 | Disposition: A | Discharge status: Home / Self Care | Payer: Medicare Other

## 2017-04-14 ENCOUNTER — Emergency Department (HOSPITAL_COMMUNITY)
Admission: EM | Admit: 2017-04-14 | Discharge: 2017-04-14 | Disposition: A | Discharge status: Home / Self Care | Payer: Medicare Other | Attending: Physician Assistant | Admitting: Physician Assistant

## 2017-04-14 ENCOUNTER — Emergency Department (HOSPITAL_COMMUNITY): Payer: Medicare Other

## 2017-04-14 DIAGNOSIS — Z7901 Long term (current) use of anticoagulants: Secondary | ICD-10-CM | POA: Insufficient documentation

## 2017-04-14 DIAGNOSIS — S0990XA Unspecified injury of head, initial encounter: Secondary | ICD-10-CM | POA: Diagnosis not present

## 2017-04-14 DIAGNOSIS — Y999 Unspecified external cause status: Secondary | ICD-10-CM | POA: Diagnosis not present

## 2017-04-14 DIAGNOSIS — Z043 Encounter for examination and observation following other accident: Secondary | ICD-10-CM | POA: Diagnosis not present

## 2017-04-14 DIAGNOSIS — R51 Headache: Secondary | ICD-10-CM | POA: Diagnosis not present

## 2017-04-14 DIAGNOSIS — I5030 Unspecified diastolic (congestive) heart failure: Secondary | ICD-10-CM | POA: Diagnosis not present

## 2017-04-14 DIAGNOSIS — E1140 Type 2 diabetes mellitus with diabetic neuropathy, unspecified: Secondary | ICD-10-CM | POA: Insufficient documentation

## 2017-04-14 DIAGNOSIS — Y92002 Bathroom of unspecified non-institutional (private) residence single-family (private) house as the place of occurrence of the external cause: Secondary | ICD-10-CM | POA: Diagnosis not present

## 2017-04-14 DIAGNOSIS — Z79899 Other long term (current) drug therapy: Secondary | ICD-10-CM | POA: Insufficient documentation

## 2017-04-14 DIAGNOSIS — W1811XA Fall from or off toilet without subsequent striking against object, initial encounter: Secondary | ICD-10-CM | POA: Diagnosis not present

## 2017-04-14 DIAGNOSIS — I110 Hypertensive heart disease with heart failure: Secondary | ICD-10-CM | POA: Insufficient documentation

## 2017-04-14 DIAGNOSIS — W19XXXA Unspecified fall, initial encounter: Secondary | ICD-10-CM

## 2017-04-14 NOTE — ED Triage Notes (Signed)
Pt.'s husband stated, She had to go to bathroom and she was probably constipated, I had to help her with her pants I turned around and she was down and big ball of boop was out and all dry . She didn't hit anything just fell to the floor getting on the commode. She take Coumadin. Pt stated, my head hurts now and then a little bit.

## 2017-04-14 NOTE — ED Provider Notes (Signed)
[facility] EMERGENCY DEPARTMENT Provider Note   CSN: [id number] Arrival date & time: 04/14/17  1329     History   Chief Complaint Chief Complaint  Patient presents with  . Fall    HPI TAMEE BATTIN is a 83 y.o. female.  HPI   Patient is a 83 year old female presenting with mechanical fall.  Patient was in the bathroom and having trouble with her pants zipper, her husband try to help her and she fell down.  Patient did not lose consciousness she has no other symptoms.  Patient's been waiting in the lobby for couple hours and family would now like to go home.  Patient had no focal symptoms.  This happened earlier this morning.  Patient took a nap afterwards.  And then patient's son took her blood pressure and thought it was low so brought her here to the emergency department.  Past Medical History:  Diagnosis Date  . Anxiety   . Aphasia as late effect of cerebrovascular accident   . Atrial fibrillation (HCC)   . CVA (cerebral infarction)   . Diabetes mellitus type II   . GERD (gastroesophageal reflux disease)   . Hiatal hernia   . HTN (hypertension)   . Hyperlipidemia   . IBS (irritable bowel syndrome)   . Stroke (HCC)   . Vitamin D deficiency     Patient Active Problem List   Diagnosis Date Noted  . Type 2 diabetes mellitus with diabetic neuropathy, without long-term current use of insulin (HCC) 12/25/2016  . Atherosclerosis of aorta (HCC) 12/03/2015  . Medicare annual wellness visit, subsequent 02/18/2015  . GERD 09/25/2014  . Diastolic CHF (HCC) 08/04/2014  . Bursitis of hip 03/03/2014  . Medication management 07/01/2013  . Hyperlipidemia   . Vitamin D deficiency   . T2_NIDDM w/ CKD2 (GFR 64 ml/min) 03/15/2010  . Anxiety state 03/15/2010  . Essential hypertension 03/15/2010  . Atrial fibrillation (HCC) 03/15/2010  . APHASIA DUE TO CEREBROVASCULAR DISEASE 03/15/2010  . CVA  03/07/2010    Past Surgical History:  Procedure Laterality  Date  . CATARACT EXTRACTION, BILATERAL    . CHOLECYSTECTOMY    . COLONOSCOPY  2005  . TONSILLECTOMY    . UPPER GASTROINTESTINAL ENDOSCOPY  2005    OB History    No data available       Home Medications    Prior to Admission medications   Medication Sig Start Date End Date Taking? Authorizing Provider  acetaminophen (TYLENOL) 325 MG tablet Take 325-650 mg by mouth every 4 (four) hours as needed for mild pain, moderate pain or headache.     [provider]  Cholecalciferol (VITAMIN D) 2000 UNITS CAPS Take 2,000 Units by mouth daily.     [provider]  dicyclomine (BENTYL) 20 MG tablet Take 1 tablet 3 x day if needed for nausea, bloating , cramping or diarrhea 06/24/15   Lucky Cowboy, MD  furosemide (LASIX) 20 MG tablet Take 1 tablet (20 mg total) by mouth daily. 07/04/16   Lucky Cowboy, MD  losartan (COZAAR) 50 MG tablet Take 1 tablet in the morning.  Check blood pressure at lunch.  If blood pressure is over 140/90 please give another tablet. 07/14/16   Forcucci, Courtney, PA-C  Magnesium Hydroxide (MAGNESIA PO) Take 1 tablet by mouth daily.     [provider]  metoprolol tartrate (LOPRESSOR) 25 MG tablet Take 0.5 tablets (12.5 mg total) by mouth 2 (two) times daily. 07/04/16 10/02/16  Lucky Cowboy, MD  nystatin cream (MYCOSTATIN) Apply 1 application topically 4 (four) times daily. Apply to affected area every 4-6 hours x 10 days 12/28/16   Lucky CowboyMcKeown, William, MD  pantoprazole (PROTONIX) 40 MG tablet TAKE ONE TABLET BY MOUTH DAILY FOR ACID INDIGESTION 03/05/17   Lucky CowboyMcKeown, William, MD  potassium chloride (K-DUR) 10 MEQ tablet TAKE ONE TABLET BY MOUTH DAILY 10/17/16   Lucky CowboyMcKeown, William, MD  QUEtiapine (SEROQUEL) 25 MG tablet Take 1 tablet at bedtime 08/12/16 02/12/17  Lucky CowboyMcKeown, William, MD  ranitidine (ZANTAC) 300 MG tablet TAKE ONE TABLET BY MOUTH TWICE A DAY 01/23/17   Lucky CowboyMcKeown, William, MD  triamcinolone cream (KENALOG) 0.5 % Apply 1 application topically 2 (two)  times daily. 12/28/16   Quentin Mullingollier, Amanda, PA-C  warfarin (COUMADIN) 2 MG tablet TAKE AS DIRECTED BY ANTICOAGULATION CLINIC 01/02/17   Quentin Mullingollier, Amanda, PA-C    Family History Family History  Problem Relation Age of Onset  . Diabetes Mother   . Stroke Mother   . Hypertension Brother   . Heart disease Brother   . Arthritis Sister        Rhematoid  . Arthritis Sister        Rhematoid    Social History Social History   Tobacco Use  . Smoking status: Never Smoker  . Smokeless tobacco: Never Used  Substance Use Topics  . Alcohol use: No  . Drug use: No     Allergies   Ace inhibitors; Fosamax [alendronate sodium]; Norvasc [amlodipine besylate]; and Zocor [simvastatin]   Review of Systems Review of Systems  Constitutional: Negative for activity change.  Respiratory: Negative for shortness of breath.   Cardiovascular: Negative for chest pain.  Gastrointestinal: Negative for abdominal pain.  Neurological: Negative for dizziness, facial asymmetry and weakness.  All other systems reviewed and are negative.    Physical Exam Updated Vital Signs BP (!) 144/79   Pulse 80   Temp 97.9 F (36.6 C) (Oral)   Resp 16   Ht 5\' 1"  (1.549 m)   Wt 59 kg (130 lb)   SpO2 100%   BMI 24.56 kg/m   Physical Exam  Constitutional: She appears well-developed and well-nourished.  HENT:  Head: Normocephalic and atraumatic.  Eyes: EOM are normal. Pupils are equal, round, and reactive to light. Right eye exhibits no discharge.  Cardiovascular: Normal rate, regular rhythm and normal heart sounds.  No murmur heard. Pulmonary/Chest: Effort normal and breath sounds normal. She has no wheezes. She has no rales.  Abdominal: Soft. She exhibits no distension. There is no tenderness.  Musculoskeletal:  Range of motion the bilateral upper and lower extremities  Neurological:  Oreinted to self and place  Skin: Skin is warm and dry. She is not diaphoretic.  Psychiatric: She has a normal mood and affect.   Nursing note and vitals reviewed.    ED Treatments / Results  Labs (all labs ordered are listed, but only abnormal results are displayed) Labs Reviewed - No data to display  EKG  EKG Interpretation None       Radiology Ct Head Wo Contrast  Result Date: 04/14/2017 CLINICAL DATA:  Head trauma, ataxia EXAM: CT HEAD WITHOUT CONTRAST TECHNIQUE: Contiguous axial images were obtained from the base of the skull through the vertex without intravenous contrast. COMPARISON:  06/16/2016 FINDINGS: Brain: Old left frontal infarct with encephalomalacia, stable. Stable old right cerebellar infarct. Chronic small vessel disease throughout the deep white matter. No acute intracranial abnormality. Specifically, no hemorrhage, hydrocephalus, mass lesion, acute infarction, or significant intracranial injury. Vascular:  No hyperdense vessel or unexpected calcification. Skull: No acute calvarial abnormality. Sinuses/Orbits: No acute finding Other: None IMPRESSION: Old left frontal and right cerebellar infarcts. Chronic small vessel disease. No acute intracranial abnormality. Electronically Signed   By: Charlett NoseKevin  Dover M.D.   On: 04/14/2017 15:31    Procedures Procedures (including critical care time)  Medications Ordered in ED Medications - No data to display   Initial Impression / Assessment and Plan / ED Course  I have reviewed the triage vital signs and the nursing notes.  Pertinent labs & imaging results that were available during my care of the patient were reviewed by me and considered in my medical decision making (see chart for details).     Patient is a [age] year old female presenting with mechanical fall.  Patient was in the bathroom and having trouble with her pants zipper, her husband try to help her and she fell down.  Patient did not lose consciousness she has no other symptoms.  Patient's been waiting in the lobby for couple hours and family would now like to go home.  Patient had no focal  symptoms.  This happened earlier this morning.  Patient took a nap afterwards.  And then patient's son took her blood pressure and thought it was low so brought her here to the emergency department.  CT is negative.  Patient has completely normal vital signs here.  We offered workup for her urinary tract infection chest x-ray etc. but patient's family would like her to return home and she feels back to baseline.  Think this is a reasonable choice.  She has very reassuring physical exam and vital signs.  Final Clinical Impressions(s) / ED Diagnoses   Final diagnoses:  None    ED Discharge Orders    None       Abelino DerrickMackuen, Anise Harbin Lyn, MD 04/14/17 1631

## 2017-04-14 NOTE — ED Notes (Signed)
Headache, abdominal pain, dizziness this morning.  Patient trying to use toilet, husband helped with clothing, turned away and turned back to see patient on the floor.  Denies hitting head.  With history and blood thinners discussed with providers (michelle, PA) and patient and family went to ed

## 2017-04-14 NOTE — Discharge Instructions (Signed)
Please return if you would like further workup including urinalysis chest x-ray etc.  Otherwise Ms. Lisa Hopkins appears very well with normal vital signs.  Please return with any concerns.

## 2017-04-15 ENCOUNTER — Other Ambulatory Visit: Payer: Self-pay | Admitting: Internal Medicine

## 2017-04-15 DIAGNOSIS — I10 Essential (primary) hypertension: Secondary | ICD-10-CM

## 2017-04-15 DIAGNOSIS — I5032 Chronic diastolic (congestive) heart failure: Secondary | ICD-10-CM

## 2017-04-17 ENCOUNTER — Encounter: Payer: Self-pay | Admitting: Internal Medicine

## 2017-04-17 NOTE — Progress Notes (Signed)
[facility] Lucky CowboyWilliam Perlie Scheuring, M.D.     Dyanne CarrelAmanda R. Steffanie Dunnollier, P.A.-C Judd GaudierAshley Corbett, DNP [facility] [address] [place], [place] [id number] Telephone [phone] Telefax [phone] Annual Screening/Preventative Visit & Comprehensive Evaluation &  Examination     This very nice 83 y.o. MWF presents for a Screening/Preventative Visit & comprehensive evaluation and management of multiple medical co-morbidities.  Patient has been followed for HTN, ASCVD/CVA's,  T2_NIDDM, Hyperlipidemia and Vitamin D Deficiency. Patient is on Protonix & Ranitidine for GERD and also is on Dicyclomine prn  for hx/o IBS.      HTN predates since 731992. 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 Consequent of her CVA and advanced age, she has residual anomia, poor short term recall and dulled insight and cognition . She is considered high fall risk. She inconsistently  uses a walker to stabilize her unsteady gait and apparently  fell on Nov 17 trying to unlatch her pants for any urgency of diarrhea and had a head injury w/o LOC and [facility] and had a negative Head CT scan.  Patient's BP has been controlled at home and patient denies any cardiac symptoms as chest pain, palpitations, shortness of breath, dizziness or ankle swelling. Today's BP is at goal - 118/80.      Patient's hyperlipidemia is not controlled with diet and medications. Aggressive pharmacologic treatment is deferred for age considerations. Last lipids were not at goal: Lab Results  Component Value Date   CHOL 230 (H) 12/26/2016   HDL 44 (L) 12/26/2016   LDLCALC 149 (H) 12/26/2016   TRIG 186 (H) 12/26/2016   CHOLHDL 5.2 (H) 12/26/2016      Patient has T2_NIDDM since 2003 and also has CKD3 GFR 51) and she is attempting to control sugars w/ diet.  She denies reactive hypoglycemic symptoms, visual  blurring, diabetic polys, or paresthesias. Last A1c was not at goal: Lab Results  Component Value Date   HGBA1C 6.3 (H) 12/26/2016      Finally, patient has history of Vitamin D Deficiency ("35" / 2011) and last Vitamin D was near goal (70-100): Lab Results  Component Value Date   VD25OH 49 09/20/2016   Current Outpatient Medications on File Prior to Visit  Medication Sig  . acetaminophen (TYLENOL) 325 MG tablet Take 325-650 mg by mouth every 4 (four) hours as needed for mild pain, moderate pain or headache.   . Cholecalciferol (VITAMIN D) 2000 UNITS CAPS Take 2,000 Units by mouth daily.   Marland Kitchen. dicyclomine (BENTYL) 20 MG tablet Take 1 tablet 3 x day if needed for nausea, bloating , cramping or diarrhea  . furosemide (LASIX) 20 MG tablet Take 1 tablet (20 mg total) by mouth daily.  Marland Kitchen. losartan (COZAAR) 50 MG tablet Take 1 tablet in the morning.  Check blood pressure at lunch.  If blood pressure is over 140/90 please give another tablet.  . Magnesium Hydroxide (MAGNESIA PO) Take 1 tablet by mouth daily.   . pantoprazole (PROTONIX) 40 MG tablet TAKE ONE TABLET BY MOUTH DAILY FOR ACID INDIGESTION  . potassium chloride (K-DUR) 10 MEQ tablet Take 1 tablet daily  . ranitidine (ZANTAC) 300 MG tablet TAKE ONE TABLET BY MOUTH TWICE A DAY  . warfarin (COUMADIN) 2 MG tablet TAKE AS DIRECTED BY ANTICOAGULATION CLINIC  . metoprolol tartrate (LOPRESSOR) 25 MG tablet Take 0.5 tablets (12.5 mg total) by  mouth 2 (two) times daily.  . QUEtiapine (SEROQUEL) 25 MG tablet Take 1 tablet at bedtime   No current facility-administered medications on file prior to visit.    Allergies  Allergen Reactions  . Ace Inhibitors     Cough  . Fosamax [Alendronate Sodium]     Heart burn  . Norvasc [Amlodipine Besylate]     edema  . Zocor [Simvastatin]     Elevated CPK   Past Medical History:  Diagnosis Date  . Anxiety   . Aphasia as late effect of cerebrovascular accident   . Atrial fibrillation (HCC)   . CVA  (cerebral infarction)   . Diabetes mellitus type II   . GERD (gastroesophageal reflux disease)   . Hiatal hernia   . HTN (hypertension)   . Hyperlipidemia   . IBS (irritable bowel syndrome)   . Stroke (HCC)   . Vitamin D deficiency    Health Maintenance  Topic Date Due  . OPHTHALMOLOGY EXAM  11/29/2016  . FOOT EXAM  03/16/2017  . PNA vac Low Risk Adult (2 of 2 - PCV13) 05/15/2017 (Originally 04/27/2005)  . HEMOGLOBIN A1C  06/28/2017  . TETANUS/TDAP  02/17/2025  . INFLUENZA VACCINE  Completed  . DEXA SCAN  Completed   Immunization History  Administered Date(s) Administered  . DT 02/18/2015  . Influenza, High Dose Seasonal PF 03/05/2014, 02/18/2015, 03/16/2016, 03/05/2017  . Influenza-Unspecified 01/30/2013  . Pneumococcal-Unspecified 04/27/2004  . Td 06/28/2003  . Zoster 04/27/2009   Past Surgical History:  Procedure Laterality Date  . CATARACT EXTRACTION, BILATERAL    . CHOLECYSTECTOMY    . COLONOSCOPY  2005  . TONSILLECTOMY    . UPPER GASTROINTESTINAL ENDOSCOPY  2005   Family History  Problem Relation Age of Onset  . Diabetes Mother   . Stroke Mother   . Hypertension Brother   . Heart disease Brother   . Arthritis Sister        Rhematoid  . Arthritis Sister        Rhematoid   Social History   Tobacco Use  . Smoking status: Never Smoker  . Smokeless tobacco: Never Used  Substance Use Topics  . Alcohol use: No  . Drug use: No    ROS Constitutional: Denies fever, chills, weight loss/gain, headaches, insomnia,  night sweats, and change in appetite. Does c/o fatigue. Eyes: Denies redness, blurred vision, diplopia, discharge, itchy, watery eyes.  ENT: Denies discharge, congestion, post nasal drip, epistaxis, sore throat, earache, hearing loss, dental pain, Tinnitus, Vertigo, Sinus pain, snoring.  Cardio: Denies chest pain, palpitations, irregular heartbeat, syncope, dyspnea, diaphoresis, orthopnea, PND, claudication, edema Respiratory: denies cough, dyspnea,  DOE, pleurisy, hoarseness, laryngitis, wheezing.  Gastrointestinal: Denies dysphagia, heartburn, reflux, water brash, pain, cramps, nausea, vomiting, bloating, diarrhea, constipation, hematemesis, melena, hematochezia, jaundice, hemorrhoids Genitourinary: Denies dysuria, frequency, urgency, nocturia, hesitancy, discharge, hematuria, flank pain Breast: Breast lumps, nipple discharge, bleeding.  Musculoskeletal: Denies arthralgia, myalgia, stiffness, Jt. Swelling, pain, limp, and strain/sprain. Denies falls. Skin: Denies puritis, rash, hives, warts, acne, eczema, changing in skin lesion Neuro: No weakness, tremor, incoordination, spasms, paresthesia, pain Psychiatric: Denies confusion, memory loss, sensory loss. Denies Depression. Endocrine: Denies change in weight, skin, hair change, nocturia, and paresthesia, diabetic polys, visual blurring, hyper / hypo glycemic episodes.  Heme/Lymph: No excessive bleeding, bruising, enlarged lymph nodes.  Physical Exam  BP 118/80   Pulse 76   Temp (!) 97 F (36.1 C)   Resp 16   Ht 5\' 3"  (1.6 m)   Wt 126  lb 3.2 oz (57.2 kg)   BMI 22.36 kg/m   General Appearance: Well nourished, well groomed and in no apparent distress.  Eyes: PERRLA, EOMs, conjunctiva no swelling or erythema, normal fundi and vessels. Sinuses: No frontal/maxillary tenderness ENT/Mouth: EACs patent / TMs  nl. Nares clear without erythema, swelling, mucoid exudates. Oral hygiene is good. No erythema, swelling, or exudate. Tongue normal, non-obstructing. Tonsils not swollen or erythematous. Hearing normal.  Neck: Supple, thyroid normal. No bruits, nodes or JVD. Respiratory: Respiratory effort normal.  BS equal and clear bilateral without rales, rhonci, wheezing or stridor. Cardio: Heart sounds are normal with regular rate and rhythm and no murmurs, rubs or gallops. Peripheral pulses are normal and equal bilaterally without edema. No aortic or femoral bruits. Chest: symmetric with normal  excursions and percussion. Breasts: Symmetric, without lumps, nipple discharge, retractions, or fibrocystic changes.  Abdomen: Flat, soft with bowel sounds active. Nontender, no guarding, rebound, hernias, masses, or organomegaly.  Lymphatics: Non tender without lymphadenopathy.  Genitourinary:  Musculoskeletal: Full ROM all peripheral extremities, joint stability, 5/5 strength, and normal gait. Skin: Warm and dry without rashes, lesions, cyanosis, clubbing or  ecchymosis.  Neuro: Cranial nerves intact, reflexes equal bilaterally. Normal muscle tone, no cerebellar symptoms. Sensation intact.  Pysch: Alert and oriented X 3, normal affect, Insight and Judgment appropriate.   Assessment and Plan  1. Annual Preventative Screening Examination  2. Essential hypertension  - EKG 12-Lead - US, RETROPERITNL ABD,  LTD - Urinalysis, Routine w reflex microscopic - Microalbumin / creatinine urine ratio - CBC with Differential/Platelet - BASIC METABOLIC PANEL WITH GFR - Magnesium - TSH  3. Hyperlipidemia, mixed  - EKG 12-Lead - US, RETROPERITNL ABD,  LTD - Hepatic function panel - Lipid panel - TSH  4. Type 2 diabetes mellitus with stage 3 chronic kidney disease, without long-term current use of insulin (HCC)  - EKG 12-Lead - US, RETROPERITNL ABD,  LTD - HM DIABETES FOOT EXAM - LOW EXTREMITY NEUR EXAM DOCUM - Hemoglobin A1c - Insulin, random  5. Vitamin D deficiency  - VITAMIN D 25 Hydroxy (Vit-D Deficiency, Fractures)  6. Persistent atrial fibrillation (HCC)  - EKG 12-Lead - TSH  7. Cerebrovascular accident (CVA) [facility](HCC)  - Ambulatory referral to Home Health  8. Unstable gait  - Ambulatory referral to Home Health  9. Injury of head, subsequent encounter  - Ambulatory referral to Home Health  10. Dementia with behavioral disturbance, unspecified dementia type  - Ambulatory referral to Home Health  11. Gastroesophageal reflux disease, esophagitis presence not  specified  - CBC with Differential/Platelet  12. Irritable bowel syndrome  13. Screening for ischemic heart disease  - EKG 12-Lead  14. Screening for AAA (aortic abdominal aneurysm)  - US, RETROPERITNL ABD,  LTD  15. Atherosclerosis of aorta (HCC)  - US, RETROPERITNL ABD,  LTD  16. Screening for colorectal cancer  - POC Hemoccult Bld/Stl  17. Medication management  - Urinalysis, Routine w reflex microscopic - Microalbumin / creatinine urine ratio  18. Dysphagia as late effect of cerebrovascular disease  - Ambulatory referral to Home Health       Patient was counseled in prudent diet to achieve/maintain BMI less than 25 for weight control, BP monitoring, regular exercise and medications. Discussed med's effects and SE's. Screening labs and tests as requested with regular follow-up as recommended. Over 40 minutes of exam, counseling, chart review and high complex critical decision making was performed.

## 2017-04-17 NOTE — Patient Instructions (Signed)
Bleeding Precautions When on Anticoagulant Therapy  WHAT IS ANTICOAGULANT THERAPY? Anticoagulant therapy is taking medicine to prevent or reduce blood clots. It is also called blood thinner therapy. Blood clots that form in your blood vessels can be dangerous. They can break loose and travel to your heart, lungs, or brain. This increases your risk of a heart attack or stroke. Anticoagulant therapy causes blood to clot more slowly. You may need anticoagulant therapy if you have:  A medical condition that increases the likelihood that blood clots will form.  A heart defect or a problem with heart rhythm. It is also a common treatment after heart surgery, such as valve replacement. WHAT ARE COMMON TYPES OF ANTICOAGULANT THERAPY? Anticoagulant medicine can be injected or taken by mouth.If you need anticoagulant therapy quickly at the hospital, the medicine may be injected under your skin or given through an IV tube. Heparin is a common example of an anticoagulant that you may get at the hospital. Most anticoagulant therapy is in the form of pills that you take at home every day. These may include:  Aspirin. This common blood thinner works by preventing blood cells (platelets) from sticking together to form a clot. Aspirin is not as strong as anticoagulants that slow down the time that it takes for your body to form a clot.  Clopidogrel. This is a newer type of drug that affects platelets. It is stronger than aspirin.  Warfarin. This is the most common anticoagulant. It changes the way your body uses vitamin K, a vitamin that helps your blood to clot. The risk of bleeding is higher with warfarin than with aspirin. You will need frequent blood tests to make sure you are taking the safest amount.  New anticoagulants. Several new drugs have been approved. They are all taken by mouth. Studies show that these drugs work as well as warfarin. They do not require blood testing. They may cause less bleeding  risk than warfarin. WHAT DO I NEED TO REMEMBER WHEN TAKING ANTICOAGULANT THERAPY? Anticoagulant therapy decreases your risk of forming a blood clot, but it increases your risk of bleeding. Work closely with your health care provider to make sure you are taking your medicine safely. These tips can help:  Learn ways to reduce your risk of bleeding.  If you are taking warfarin: ? Have blood tests as ordered by your health care provider. ? Do not make any sudden changes to your diet. Vitamin K in your diet can make warfarin less effective. ? Do not get pregnant. This medicine may cause birth defects.  Take your medicine at the same time every day. If you forget to take your medicine, take it as soon as you remember. If you miss a whole day, do not double your dose of medicine. Take your normal dose and call your health care provider to check in.  Do not stop taking your medicine on your own.  Tell your health care provider before you start taking any new medicine, vitamin, or herbal product. Some of these could interfere with your therapy.  Tell all of your health care providers that you are on anticoagulant therapy.  Do not have surgery, medical procedures, or dental work until you tell your health care provider that you are on anticoagulant therapy. WHAT CAN AFFECT HOW ANTICOAGULANTS WORK? Certain foods, vitamins, medicines, supplements, and herbal medicines change the way that anticoagulant therapy works. They may increase or decrease the effects of your anticoagulant therapy. Either result can be dangerous for you.  Many over-the-counter medicines for pain, colds, or stomach problems interfere with anticoagulant therapy. Take these only as told by your health care provider.  Do not drink alcohol. It can interfere with your medicine and increase your risk of an injury that causes bleeding.  If you are taking warfarin, do not begin eating more foods that contain vitamin K. These include  leafy green vegetables. Ask your health care provider if you should avoid any foods. WHAT ARE SOME WAYS TO PREVENT BLEEDING? You can prevent bleeding by taking certain precautions:  Be extra careful when you use knives, scissors, or other sharp objects.  Use an electric razor instead of a blade.  Do not use toothpicks.  Use a soft toothbrush.  Wear shoes that have nonskid soles.  Use bath mats and handrails in your bathroom.  Wear gloves while you do yard work.  Wear a helmet when you ride a bike.  Wear your seat belt.  Prevent falls by removing loose rugs and extension cords from areas where you walk.  Do not play contact sports or participate in other activities that have a high risk of injury. [place] PROVIDER? Call your health care provider if:  You miss a dose of medicine: ? And you are not sure what to do. ? For more than one day.  You have: ? Menstrual bleeding that is heavier than normal. ? Blood in your urine. ? A bloody nose or bleeding gums. ? Easy bruising. ? Blood in your stool (feces) or have black and tarry stool. ? Side effects from your medicine.  You feel weak or dizzy.  You become pregnant. Seek immediate medical care if:  You have bleeding that will not stop.  You have sudden and severe headache or belly pain.  You vomit or you cough up bright red blood.  You have a severe blow to your head. WHAT ARE SOME QUESTIONS TO ASK MY HEALTH CARE PROVIDER?  What is the best anticoagulant therapy for my condition?  What side effects should I watch for?  When should I take my medicine? What should I do if I forget to take it?  Will I need to have regular blood tests?  Do I need to change my diet? Are there foods or drinks that I should avoid?  What activities are safe for me?  What should I do if I want to get pregnant? This information is not intended to replace advice given to you by your health care provider.  Make sure you discuss any questions you have with your health care provider. Document Released: 04/26/2015 Document Reviewed: 04/26/2015 Elsevier Interactive Patient Education  2017 [place].  ++++++++++++++++++++++++++++ Preventive Care for Adults  A healthy lifestyle and preventive care can promote health and wellness. Preventive health guidelines for women include the following key practices.  A routine yearly physical is a good way to check with your health care provider about your health and preventive screening. It is a chance to share any concerns and updates on your health and to receive a thorough exam.  Visit your dentist for a routine exam and preventive care every 6 months. Brush your teeth twice a day and floss once a day. Good oral hygiene prevents tooth decay and gum disease.  The frequency of eye exams is based on your age, health, family medical history, use of contact lenses, and other factors. Follow your health care provider's recommendations for frequency of eye exams.  Eat a healthy  diet. Foods like vegetables, fruits, whole grains, low-fat dairy products, and lean protein foods contain the nutrients you need without too many calories. Decrease your intake of foods high in solid fats, added sugars, and salt. Eat the right amount of calories for you.Get information about a proper diet from your health care provider, if necessary.  Regular physical exercise is one of the most important things you can do for your health. Most adults should get at least 150 minutes of moderate-intensity exercise (any activity that increases your heart rate and causes you to sweat) each week. In addition, most adults need muscle-strengthening exercises on 2 or more days a week.  Maintain a healthy weight. The body mass index (BMI) is a screening tool to identify possible weight problems. It provides an estimate of body fat based on height and weight. Your health care provider can find your  BMI and can help you achieve or maintain a healthy weight.For adults 20 years and older:  A BMI below 18.5 is considered underweight.  A BMI of 18.5 to 24.9 is normal.  A BMI of 25 to 29.9 is considered overweight.  A BMI of 30 and above is considered obese.  Maintain normal blood lipids and cholesterol levels by exercising and minimizing your intake of saturated fat. Eat a balanced diet with plenty of fruit and vegetables. If your lipid or cholesterol levels are high, you are over 50, or you are at high risk for heart disease, you may need your cholesterol levels checked more frequently.Ongoing high lipid and cholesterol levels should be treated with medicines if diet and exercise are not working.  If you smoke, find out from your health care provider how to quit. If you do not use tobacco, do not start.  Lung cancer screening is recommended for adults aged 6-80 years who are at high risk for developing lung cancer because of a history of smoking. A yearly low-dose CT scan of the lungs is recommended for people who have at least a 30-pack-year history of smoking and are a current smoker or have quit within the past 15 years. A pack year of smoking is smoking an average of 1 pack of cigarettes a day for 1 year (for example: 1 pack a day for 30 years or 2 packs a day for 15 years). Yearly screening should continue until the smoker has stopped smoking for at least 15 years. Yearly screening should be stopped for people who develop a health problem that would prevent them from having lung cancer treatment.  Avoid use of street drugs. Do not share needles with anyone. Ask for help if you need support or instructions about stopping the use of drugs.  High blood pressure causes heart disease and increases the risk of stroke.  Ongoing high blood pressure should be treated with medicines if weight loss and exercise do not work.  If you are 70-25 years old, ask your health care provider if you should  take aspirin to prevent strokes.  Diabetes screening involves taking a blood sample to check your fasting blood sugar level. This should be done once every 3 years, after age 63, if you are within normal weight and without risk factors for diabetes. Testing should be considered at a younger age or be carried out more frequently if you are overweight and have at least 1 risk factor for diabetes.  Breast cancer screening is essential preventive care for women. You should practice "breast self-awareness." This means understanding the normal appearance and feel  of your breasts and may include breast self-examination. Any changes detected, no matter how small, should be reported to a health care provider. Women in their 71s and 30s should have a clinical breast exam (CBE) by a health care provider as part of a regular health exam every 1 to 3 years. After age 60, women should have a CBE every year. Starting at age 16, women should consider having a mammogram (breast X-ray test) every year. Women who have a family history of breast cancer should talk to their health care provider about genetic screening. Women at a high risk of breast cancer should talk to their health care providers about having an MRI and a mammogram every year.  Breast cancer gene (BRCA)-related cancer risk assessment is recommended for women who have family members with BRCA-related cancers. BRCA-related cancers include breast, ovarian, tubal, and peritoneal cancers. Having family members with these cancers may be associated with an increased risk for harmful changes (mutations) in the breast cancer genes BRCA1 and BRCA2. Results of the assessment will determine the need for genetic counseling and BRCA1 and BRCA2 testing.  Routine pelvic exams to screen for cancer are no longer recommended for nonpregnant women who are considered low risk for cancer of the pelvic organs (ovaries, uterus, and vagina) and who do not have symptoms. Ask your  health care provider if a screening pelvic exam is right for you.  If you have had past treatment for cervical cancer or a condition that could lead to cancer, you need Pap tests and screening for cancer for at least 20 years after your treatment. If Pap tests have been discontinued, your risk factors (such as having a new sexual partner) need to be reassessed to determine if screening should be resumed. Some women have medical problems that increase the chance of getting cervical cancer. In these cases, your health care provider may recommend more frequent screening and Pap tests.    Colorectal cancer can be detected and often prevented. Most routine colorectal cancer screening begins at the age of 84 years and continues through age 50 years. However, your health care provider may recommend screening at an earlier age if you have risk factors for colon cancer. On a yearly basis, your health care provider may provide home test kits to check for hidden blood in the stool. Use of a small camera at the end of a tube, to directly examine the colon (sigmoidoscopy or colonoscopy), can detect the earliest forms of colorectal cancer. Talk to your health care provider about this at age 1, when routine screening begins. Direct exam of the colon should be repeated every 5-10 years through age 41 years, unless early forms of pre-cancerous polyps or small growths are found.  Osteoporosis is a disease in which the bones lose minerals and strength with aging. This can result in serious bone fractures or breaks. The risk of osteoporosis can be identified using a bone density scan. Women ages 102 years and over and women at risk for fractures or osteoporosis should discuss screening with their health care providers. Ask your health care provider whether you should take a calcium supplement or vitamin D to reduce the rate of osteoporosis.  Menopause can be associated with physical symptoms and risks. Hormone replacement  therapy is available to decrease symptoms and risks. You should talk to your health care provider about whether hormone replacement therapy is right for you.  Use sunscreen. Apply sunscreen liberally and repeatedly throughout the day. You should seek shade  when your shadow is shorter than you. Protect yourself by wearing long sleeves, pants, a wide-brimmed hat, and sunglasses year round, whenever you are outdoors.  Once a month, do a whole body skin exam, using a mirror to look at the skin on your back. Tell your health care provider of new moles, moles that have irregular borders, moles that are larger than a pencil eraser, or moles that have changed in shape or color.  Stay current with required vaccines (immunizations).  Influenza vaccine. All adults should be immunized every year.  Tetanus, diphtheria, and acellular pertussis (Td, Tdap) vaccine. Pregnant women should receive 1 dose of Tdap vaccine during each pregnancy. The dose should be obtained regardless of the length of time since the last dose. Immunization is preferred during the 27th-36th week of gestation. An adult who has not previously received Tdap or who does not know her vaccine status should receive 1 dose of Tdap. This initial dose should be followed by tetanus and diphtheria toxoids (Td) booster doses every 10 years. Adults with an unknown or incomplete history of completing a 3-dose immunization series with Td-containing vaccines should begin or complete a primary immunization series including a Tdap dose. Adults should receive a Td booster every 10 years.    Zoster vaccine. One dose is recommended for adults aged 25 years or older unless certain conditions are present.    Pneumococcal 13-valent conjugate (PCV13) vaccine. When indicated, a person who is uncertain of her immunization history and has no record of immunization should receive the PCV13 vaccine. An adult aged 55 years or older who has certain medical conditions and  has not been previously immunized should receive 1 dose of PCV13 vaccine. This PCV13 should be followed with a dose of pneumococcal polysaccharide (PPSV23) vaccine. The PPSV23 vaccine dose should be obtained at least 8 weeks after the dose of PCV13 vaccine. An adult aged 30 years or older who has certain medical conditions and previously received 1 or more doses of PPSV23 vaccine should receive 1 dose of PCV13. The PCV13 vaccine dose should be obtained 1 or more years after the last PPSV23 vaccine dose.    Pneumococcal polysaccharide (PPSV23) vaccine. When PCV13 is also indicated, PCV13 should be obtained first. All adults aged 43 years and older should be immunized. An adult younger than age 51 years who has certain medical conditions should be immunized. Any person who resides in a nursing home or long-term care facility should be immunized. An adult smoker should be immunized. People with an immunocompromised condition and certain other conditions should receive both PCV13 and PPSV23 vaccines. People with human immunodeficiency virus (HIV) infection should be immunized as soon as possible after diagnosis. Immunization during chemotherapy or radiation therapy should be avoided. Routine use of PPSV23 vaccine is not recommended for American Indians, [place] Natives, or people younger than 65 years unless there are medical conditions that require PPSV23 vaccine. When indicated, people who have unknown immunization and have no record of immunization should receive PPSV23 vaccine. One-time revaccination 5 years after the first dose of PPSV23 is recommended for people aged 19-64 years who have chronic kidney failure, nephrotic syndrome, asplenia, or immunocompromised conditions. People who received 1-2 doses of PPSV23 before age 12 years should receive another dose of PPSV23 vaccine at age 43 years or later if at least 5 years have passed since the previous dose. Doses of PPSV23 are not needed for people immunized  with PPSV23 at or after age 51 years.   Preventive Services /  Frequency  Ages 67 years and over  Blood pressure check.  Lipid and cholesterol check.  Lung cancer screening. / Every year if you are aged 69-80 years and have a 30-pack-year history of smoking and currently smoke or have quit within the past 15 years. Yearly screening is stopped once you have quit smoking for at least 15 years or develop a health problem that would prevent you from having lung cancer treatment.  Clinical breast exam.** / Every year after age 11 years.  BRCA-related cancer risk assessment.** / For women who have family members with a BRCA-related cancer (breast, ovarian, tubal, or peritoneal cancers).  Mammogram.** / Every year beginning at age 65 years and continuing for as long as you are in good health. Consult with your health care provider.  Pap test.** / Every 3 years starting at age 31 years through age 8 or 106 years with 3 consecutive normal Pap tests. Testing can be stopped between 65 and 70 years with 3 consecutive normal Pap tests and no abnormal Pap or HPV tests in the past 10 years.  Fecal occult blood test (FOBT) of stool. / Every year beginning at age 8 years and continuing until age 40 years. You may not need to do this test if you get a colonoscopy every 10 years.  Flexible sigmoidoscopy or colonoscopy.** / Every 5 years for a flexible sigmoidoscopy or every 10 years for a colonoscopy beginning at age 55 years and continuing until age 80 years.  Hepatitis C blood test.** / For all people born from [birth year] through 1963 and any individual with known risks for hepatitis C.  Osteoporosis screening.** / A one-time screening for women ages 39 years and over and women at risk for fractures or osteoporosis.  Skin self-exam. / Monthly.  Influenza vaccine. / Every year.  Tetanus, diphtheria, and acellular pertussis (Tdap/Td) vaccine.** / 1 dose of Td every 10 years.  Zoster vaccine.** / 1 dose  for adults aged 52 years or older.  Pneumococcal 13-valent conjugate (PCV13) vaccine.** / Consult your health care provider.  Pneumococcal polysaccharide (PPSV23) vaccine.** / 1 dose for all adults aged 81 years and older. Screening for abdominal aortic aneurysm (AAA)  by ultrasound is recommended for people who have history of high blood pressure or who are current or former smokers. ++++++++++++++++++++ Recommend Adult Low Dose Aspirin or  coated  Aspirin 81 mg daily  To reduce risk of Colon Cancer 20 %,  Skin Cancer 26 % ,  Melanoma 46%  and  Pancreatic cancer 60% ++++++++++++++++++++ Vitamin D goal  is between 70-100.  Please make sure that you are taking your Vitamin D as directed.  It is very important as a natural anti-inflammatory  helping hair, skin, and nails, as well as reducing stroke and heart attack risk.  It helps your bones and helps with mood. It also decreases numerous cancer risks so please take it as directed.  Low Vit D is associated with a 200-300% higher risk for CANCER  and 200-300% higher risk for HEART   ATTACK  &  STROKE.   .....................................[place] Kitchen It is also associated with higher death rate at younger ages,  autoimmune diseases like Rheumatoid arthritis, Lupus, Multiple Sclerosis.    Also many other serious conditions, like depression, Alzheimer's Dementia, infertility, muscle aches, fatigue, fibromyalgia - just to name a few. ++++++++++++++++++ Recommend the book "The END of DIETING" by Dr Excell Seltzer  & the book "The END of DIABETES " by Dr Excell Seltzer At [email or web address] -  get book & Audio CD's    Being diabetic has a  300% increased risk for heart attack, stroke, cancer, and alzheimer- type vascular dementia. It is very important that you work harder with diet by avoiding all foods that are white. Avoid white rice (brown & wild rice is OK), white potatoes (sweetpotatoes in moderation is OK), White bread or wheat bread or anything made out of  white flour like bagels, donuts, rolls, buns, biscuits, cakes, pastries, cookies, pizza crust, and pasta (made from white flour & egg whites) - vegetarian pasta or spinach or wheat pasta is OK. Multigrain breads like Arnold's or Pepper[employer], or multigrain sandwich thins or flatbreads.  Diet, exercise and weight loss can reverse and cure diabetes in the early stages.  Diet, exercise and weight loss is very important in the control and prevention of complications of diabetes which affects every system in your body, ie. Brain - dementia/stroke, eyes - glaucoma/blindness, heart - heart attack/heart failure, kidneys - dialysis, stomach - gastric paralysis, intestines - malabsorption, nerves - severe painful neuritis, circulation - gangrene & loss of a leg(s), and finally cancer and Alzheimers.    I recommend avoid fried & greasy foods,  sweets/candy, white rice (brown or wild rice or Quinoa is OK), white potatoes (sweet potatoes are OK) - anything made from white flour - bagels, doughnuts, rolls, buns, biscuits,white and wheat breads, pizza crust and traditional pasta made of white flour & egg white(vegetarian pasta or spinach or wheat pasta is OK).  Multi-grain bread is OK - like multi-grain flat bread or sandwich thins. Avoid alcohol in excess. Exercise is also important.    Eat all the vegetables you want - avoid meat, especially red meat and dairy - especially cheese.  Cheese is the most concentrated form of trans-fats which is the worst thing to clog up our arteries. Veggie cheese is OK which can be found in the fresh produce section at [employer] or [employer] or [employer]  +++++++++++++++++++ DASH Eating Plan  DASH stands for "Dietary Approaches to Stop Hypertension."   The DASH eating plan is a healthy eating plan that has been shown to reduce high blood pressure (hypertension). Additional health benefits may include reducing the risk of type 2 diabetes mellitus, heart disease, and stroke. The  DASH eating plan may also help with weight loss. WHAT DO I NEED TO KNOW ABOUT THE DASH EATING PLAN? For the DASH eating plan, you will follow these general guidelines:  Choose foods with a percent daily value for sodium of less than 5% (as listed on the food label).  Use salt-free seasonings or herbs instead of table salt or sea salt.  Check with your health care provider or pharmacist before using salt substitutes.  Eat lower-sodium products, often labeled as "lower sodium" or "no salt added."  Eat fresh foods.  Eat more vegetables, fruits, and low-fat dairy products.  Choose whole grains. Look for the word "whole" as the first word in the ingredient list.  Choose fish   Limit sweets, desserts, sugars, and sugary drinks.  Choose heart-healthy fats.  Eat veggie cheese   Eat more home-cooked food and less restaurant, buffet, and fast food.  Limit fried foods.  Cook foods using methods other than frying.  Limit canned vegetables. If you do use them, rinse them well to decrease the sodium.  When eating at a restaurant, ask that your food be prepared with less salt, or no salt if possible.  WHAT FOODS CAN I EAT? Read Dr Fara Olden Fuhrman's books on The End of Dieting & The End of Diabetes  Grains Whole grain or whole wheat bread. Brown rice. Whole grain or whole wheat pasta. Quinoa, bulgur, and whole grain cereals. Low-sodium cereals. Corn or whole wheat flour tortillas. Whole grain cornbread. Whole grain crackers. Low-sodium crackers.  Vegetables Fresh or frozen vegetables (raw, steamed, roasted, or grilled). Low-sodium or reduced-sodium tomato and vegetable juices. Low-sodium or reduced-sodium tomato sauce and paste. Low-sodium or reduced-sodium canned vegetables.   Fruits All fresh, canned (in natural juice), or frozen fruits.  Protein Products  All fish and seafood.  Dried beans, peas, or lentils. Unsalted nuts and seeds. Unsalted canned  beans.  Dairy Low-fat dairy products, such as skim or 1% milk, 2% or reduced-fat cheeses, low-fat ricotta or cottage cheese, or plain low-fat yogurt. Low-sodium or reduced-sodium cheeses.  Fats and Oils Tub margarines without trans fats. Light or reduced-fat mayonnaise and salad dressings (reduced sodium). Avocado. Safflower, olive, or canola oils. Natural peanut or almond butter.  Other Unsalted popcorn and pretzels. The items listed above may not be a complete list of recommended foods or beverages. Contact your dietitian for more options.  +++++++++++++++  WHAT FOODS ARE NOT RECOMMENDED? Grains/ White flour or wheat flour White bread. White pasta. White rice. Refined cornbread. Bagels and croissants. Crackers that contain trans fat.  Vegetables  Creamed or fried vegetables. Vegetables in a . Regular canned vegetables. Regular canned tomato sauce and paste. Regular tomato and vegetable juices.  Fruits Dried fruits. Canned fruit in light or heavy syrup. Fruit juice.  Meat and Other Protein Products Meat in general - RED meat & White meat.  Fatty cuts of meat. Ribs, chicken wings, all processed meats as bacon, sausage, bologna, salami, fatback, hot dogs, bratwurst and packaged luncheon meats.  Dairy Whole or 2% milk, cream, half-and-half, and cream cheese. Whole-fat or sweetened yogurt. Full-fat cheeses or blue cheese. Non-dairy creamers and whipped toppings. Processed cheese, cheese spreads, or cheese curds.  Condiments Onion and garlic salt, seasoned salt, table salt, and sea salt. Canned and packaged gravies. Worcestershire sauce. Tartar sauce. Barbecue sauce. Teriyaki sauce. Soy sauce, including reduced sodium. Steak sauce. Fish sauce. Oyster sauce. Cocktail sauce. Horseradish. Ketchup and mustard. Meat flavorings and tenderizers. Bouillon cubes. Hot sauce. Tabasco sauce. Marinades. Taco seasonings. Relishes.  Fats and Oils Butter, stick margarine, lard, shortening and bacon  fat. Coconut, palm kernel, or palm oils. Regular salad dressings.  Pickles and olives. Salted popcorn and pretzels.  The items listed above may not be a complete list of foods and beverages to avoid.

## 2017-04-18 ENCOUNTER — Ambulatory Visit: Payer: Medicare Other | Admitting: Internal Medicine

## 2017-04-18 VITALS — BP 118/80 | HR 76 | Temp 97.0°F | Resp 16 | Ht 63.0 in | Wt 126.2 lb

## 2017-04-18 DIAGNOSIS — I639 Cerebral infarction, unspecified: Secondary | ICD-10-CM

## 2017-04-18 DIAGNOSIS — E559 Vitamin D deficiency, unspecified: Secondary | ICD-10-CM

## 2017-04-18 DIAGNOSIS — S0990XD Unspecified injury of head, subsequent encounter: Secondary | ICD-10-CM

## 2017-04-18 DIAGNOSIS — I10 Essential (primary) hypertension: Secondary | ICD-10-CM | POA: Diagnosis not present

## 2017-04-18 DIAGNOSIS — Z0000 Encounter for general adult medical examination without abnormal findings: Secondary | ICD-10-CM | POA: Diagnosis not present

## 2017-04-18 DIAGNOSIS — Z0001 Encounter for general adult medical examination with abnormal findings: Secondary | ICD-10-CM

## 2017-04-18 DIAGNOSIS — K219 Gastro-esophageal reflux disease without esophagitis: Secondary | ICD-10-CM

## 2017-04-18 DIAGNOSIS — Z136 Encounter for screening for cardiovascular disorders: Secondary | ICD-10-CM | POA: Diagnosis not present

## 2017-04-18 DIAGNOSIS — I481 Persistent atrial fibrillation: Secondary | ICD-10-CM | POA: Diagnosis not present

## 2017-04-18 DIAGNOSIS — I4819 Other persistent atrial fibrillation: Secondary | ICD-10-CM

## 2017-04-18 DIAGNOSIS — N182 Chronic kidney disease, stage 2 (mild): Secondary | ICD-10-CM

## 2017-04-18 DIAGNOSIS — E1122 Type 2 diabetes mellitus with diabetic chronic kidney disease: Secondary | ICD-10-CM

## 2017-04-18 DIAGNOSIS — K589 Irritable bowel syndrome without diarrhea: Secondary | ICD-10-CM

## 2017-04-18 DIAGNOSIS — Z1211 Encounter for screening for malignant neoplasm of colon: Secondary | ICD-10-CM

## 2017-04-18 DIAGNOSIS — Z1212 Encounter for screening for malignant neoplasm of rectum: Secondary | ICD-10-CM

## 2017-04-18 DIAGNOSIS — F0391 Unspecified dementia with behavioral disturbance: Secondary | ICD-10-CM

## 2017-04-18 DIAGNOSIS — E782 Mixed hyperlipidemia: Secondary | ICD-10-CM

## 2017-04-18 DIAGNOSIS — I69991 Dysphagia following unspecified cerebrovascular disease: Secondary | ICD-10-CM

## 2017-04-18 DIAGNOSIS — R2681 Unsteadiness on feet: Secondary | ICD-10-CM

## 2017-04-18 DIAGNOSIS — I700 Atherosclerosis of aorta: Secondary | ICD-10-CM

## 2017-04-18 DIAGNOSIS — Z79899 Other long term (current) drug therapy: Secondary | ICD-10-CM

## 2017-04-19 LAB — URINALYSIS, ROUTINE W REFLEX MICROSCOPIC
BACTERIA UA: NONE SEEN /HPF
Bilirubin Urine: NEGATIVE
Glucose, UA: NEGATIVE
KETONES UR: NEGATIVE
Leukocytes, UA: NEGATIVE
Nitrite: NEGATIVE
PROTEIN: NEGATIVE
RBC / HPF: NONE SEEN /HPF (ref 0–2)
SPECIFIC GRAVITY, URINE: 1.012 (ref 1.001–1.03)
WBC UA: NONE SEEN /HPF (ref 0–5)

## 2017-04-19 LAB — MICROALBUMIN / CREATININE URINE RATIO
CREATININE, URINE: 55 mg/dL (ref 20–275)
MICROALB UR: 0.8 mg/dL
Microalb Creat Ratio: 15 mcg/mg creat (ref <30)

## 2017-04-20 LAB — BASIC METABOLIC PANEL WITH GFR
BUN/Creatinine Ratio: 16 (calc) (ref 6–22)
BUN: 16 mg/dL (ref 7–25)
CALCIUM: 9.4 mg/dL (ref 8.6–10.4)
CO2: 27 mmol/L (ref 20–32)
CREATININE: 0.98 mg/dL — AB (ref 0.60–0.88)
Chloride: 98 mmol/L (ref 98–110)
GFR, EST NON AFRICAN AMERICAN: 51 mL/min/{1.73_m2} — AB (ref >=60)
GFR, Est African American: 59 mL/min/{1.73_m2} — ABNORMAL LOW (ref >=60)
GLUCOSE: 170 mg/dL — AB (ref 65–99)
POTASSIUM: 4.2 mmol/L (ref 3.5–5.3)
SODIUM: 135 mmol/L (ref 135–146)

## 2017-04-20 LAB — HEPATIC FUNCTION PANEL
AG RATIO: 1.3 (calc) (ref 1.0–2.5)
ALBUMIN MSPROF: 3.8 g/dL (ref 3.6–5.1)
ALKALINE PHOSPHATASE (APISO): 117 U/L (ref 33–130)
ALT: 17 U/L (ref 6–29)
AST: 25 U/L (ref 10–35)
BILIRUBIN TOTAL: 0.7 mg/dL (ref 0.2–1.2)
Bilirubin, Direct: 0.2 mg/dL (ref 0.0–0.2)
Globulin: 2.9 g/dL (calc) (ref 1.9–3.7)
Indirect Bilirubin: 0.5 mg/dL (calc) (ref 0.2–1.2)
TOTAL PROTEIN: 6.7 g/dL (ref 6.1–8.1)

## 2017-04-20 LAB — LIPID PANEL
CHOL/HDL RATIO: 6.9 (calc) — AB (ref <5.0)
Cholesterol: 221 mg/dL — ABNORMAL HIGH (ref <200)
HDL: 32 mg/dL — ABNORMAL LOW (ref >50)
LDL CHOLESTEROL (CALC): 154 mg/dL — AB
Non-HDL Cholesterol (Calc): 189 mg/dL (calc) — ABNORMAL HIGH (ref <130)
Triglycerides: 209 mg/dL — ABNORMAL HIGH (ref <150)

## 2017-04-20 LAB — MAGNESIUM: MAGNESIUM: 1.9 mg/dL (ref 1.5–2.5)

## 2017-04-20 LAB — CBC WITH DIFFERENTIAL/PLATELET
Basophils Absolute: 53 cells/uL (ref 0–200)
Basophils Relative: 0.8 %
EOS PCT: 0.3 %
Eosinophils Absolute: 20 cells/uL (ref 15–500)
HEMATOCRIT: 43.9 % (ref 35.0–45.0)
HEMOGLOBIN: 15.1 g/dL (ref 11.7–15.5)
LYMPHS ABS: 2105 {cells}/uL (ref 850–3900)
MCH: 31.5 pg (ref 27.0–33.0)
MCHC: 34.4 g/dL (ref 32.0–36.0)
MCV: 91.5 fL (ref 80.0–100.0)
MONOS PCT: 9.2 %
MPV: 9.8 fL (ref 7.5–12.5)
NEUTROS ABS: 3815 {cells}/uL (ref 1500–7800)
Neutrophils Relative %: 57.8 %
Platelets: 206 10*3/uL (ref 140–400)
RBC: 4.8 10*6/uL (ref 3.80–5.10)
RDW: 12.4 % (ref 11.0–15.0)
Total Lymphocyte: 31.9 %
WBC mixed population: 607 cells/uL (ref 200–950)
WBC: 6.6 10*3/uL (ref 3.8–10.8)

## 2017-04-20 LAB — HEMOGLOBIN A1C
Hgb A1c MFr Bld: 6.4 % of total Hgb — ABNORMAL HIGH (ref <5.7)
Mean Plasma Glucose: 137 (calc)
eAG (mmol/L): 7.6 (calc)

## 2017-04-20 LAB — VITAMIN D 25 HYDROXY (VIT D DEFICIENCY, FRACTURES): Vit D, 25-Hydroxy: 49 ng/mL (ref 30–100)

## 2017-04-20 LAB — TSH: TSH: 1.13 m[IU]/L (ref 0.40–4.50)

## 2017-04-20 LAB — INSULIN, RANDOM: Insulin: 41.8 u[IU]/mL — ABNORMAL HIGH (ref 2.0–19.6)

## 2017-05-09 ENCOUNTER — Ambulatory Visit (INDEPENDENT_AMBULATORY_CARE_PROVIDER_SITE_OTHER): Payer: Medicare Other

## 2017-05-09 DIAGNOSIS — I639 Cerebral infarction, unspecified: Secondary | ICD-10-CM | POA: Diagnosis not present

## 2017-05-09 DIAGNOSIS — Z79899 Other long term (current) drug therapy: Secondary | ICD-10-CM | POA: Diagnosis not present

## 2017-05-09 DIAGNOSIS — I4891 Unspecified atrial fibrillation: Secondary | ICD-10-CM

## 2017-05-09 LAB — POCT INR: INR: 1.5

## 2017-05-09 NOTE — Patient Instructions (Signed)
Take an extra 1/2 tablet today, then take 1 tablet tomorrow, then resume same dosage 1 tablet everyday except 1/2 tablet on Tuesdays, Thursdays, and Saturdays.  Recheck INR 2 weeks  Call with questions or any new medications. [phone]

## 2017-05-10 ENCOUNTER — Other Ambulatory Visit: Payer: Self-pay | Admitting: *Deleted

## 2017-05-10 DIAGNOSIS — Z1211 Encounter for screening for malignant neoplasm of colon: Secondary | ICD-10-CM

## 2017-05-10 DIAGNOSIS — Z1212 Encounter for screening for malignant neoplasm of rectum: Principal | ICD-10-CM

## 2017-05-10 LAB — POC HEMOCCULT BLD/STL (HOME/3-CARD/SCREEN)
Card #2 Fecal Occult Blod, POC: NEGATIVE
FECAL OCCULT BLD: NEGATIVE
Fecal Occult Blood, POC: NEGATIVE

## 2017-05-14 ENCOUNTER — Other Ambulatory Visit: Payer: Self-pay | Admitting: Internal Medicine

## 2017-05-14 DIAGNOSIS — I10 Essential (primary) hypertension: Secondary | ICD-10-CM

## 2017-05-14 MED ORDER — FUROSEMIDE 20 MG PO TABS: ORAL_TABLET | ORAL | 1 refills | Status: DC

## 2017-05-16 ENCOUNTER — Other Ambulatory Visit: Payer: Self-pay | Admitting: *Deleted

## 2017-05-16 MED ORDER — WARFARIN SODIUM 2 MG PO TABS: ORAL_TABLET | ORAL | 2 refills | Status: DC

## 2017-05-22 ENCOUNTER — Other Ambulatory Visit: Payer: Self-pay | Admitting: Internal Medicine

## 2017-05-22 DIAGNOSIS — I10 Essential (primary) hypertension: Secondary | ICD-10-CM

## 2017-05-22 MED ORDER — FUROSEMIDE 20 MG PO TABS: ORAL_TABLET | ORAL | 1 refills | Status: DC

## 2017-05-23 ENCOUNTER — Ambulatory Visit (INDEPENDENT_AMBULATORY_CARE_PROVIDER_SITE_OTHER): Payer: Medicare Other | Admitting: *Deleted

## 2017-05-23 DIAGNOSIS — I4891 Unspecified atrial fibrillation: Secondary | ICD-10-CM

## 2017-05-23 DIAGNOSIS — I639 Cerebral infarction, unspecified: Secondary | ICD-10-CM

## 2017-05-23 DIAGNOSIS — Z79899 Other long term (current) drug therapy: Secondary | ICD-10-CM

## 2017-05-23 DIAGNOSIS — Z5181 Encounter for therapeutic drug level monitoring: Secondary | ICD-10-CM

## 2017-05-23 LAB — POCT INR: INR: 1.8

## 2017-05-23 NOTE — Patient Instructions (Signed)
Description   Today take an extra 1/2 tablet, then change your dose to 1 tablet everyday except 1/2 tablet on Tuesdays and Saturdays.  Recheck INR 2 weeks  Call with questions or any new medications. [phone]

## 2017-06-06 ENCOUNTER — Ambulatory Visit (INDEPENDENT_AMBULATORY_CARE_PROVIDER_SITE_OTHER): Payer: Medicare Other

## 2017-06-06 DIAGNOSIS — I4891 Unspecified atrial fibrillation: Secondary | ICD-10-CM

## 2017-06-06 DIAGNOSIS — I639 Cerebral infarction, unspecified: Secondary | ICD-10-CM | POA: Diagnosis not present

## 2017-06-06 DIAGNOSIS — Z79899 Other long term (current) drug therapy: Secondary | ICD-10-CM

## 2017-06-06 LAB — POCT INR: INR: 2.6

## 2017-06-06 NOTE — Patient Instructions (Signed)
Description   Continue on same dosage 1 tablet everyday except 1/2 tablet on Tuesdays, Thursdays and Saturdays.  Recheck INR 4 weeks. Call [phone] if dentist wants you to hold Coumadin for any upcoming procedures.     

## 2017-06-26 DIAGNOSIS — H35363 Drusen (degenerative) of macula, bilateral: Secondary | ICD-10-CM | POA: Diagnosis not present

## 2017-06-26 DIAGNOSIS — H40013 Open angle with borderline findings, low risk, bilateral: Secondary | ICD-10-CM | POA: Diagnosis not present

## 2017-06-26 DIAGNOSIS — H04123 Dry eye syndrome of bilateral lacrimal glands: Secondary | ICD-10-CM | POA: Diagnosis not present

## 2017-06-26 DIAGNOSIS — Z961 Presence of intraocular lens: Secondary | ICD-10-CM | POA: Diagnosis not present

## 2017-06-26 LAB — HM DIABETES EYE EXAM

## 2017-07-03 ENCOUNTER — Ambulatory Visit (INDEPENDENT_AMBULATORY_CARE_PROVIDER_SITE_OTHER): Payer: Medicare Other | Admitting: *Deleted

## 2017-07-03 DIAGNOSIS — Z79899 Other long term (current) drug therapy: Secondary | ICD-10-CM

## 2017-07-03 DIAGNOSIS — Z5181 Encounter for therapeutic drug level monitoring: Secondary | ICD-10-CM

## 2017-07-03 DIAGNOSIS — I4891 Unspecified atrial fibrillation: Secondary | ICD-10-CM | POA: Diagnosis not present

## 2017-07-03 DIAGNOSIS — I639 Cerebral infarction, unspecified: Secondary | ICD-10-CM

## 2017-07-03 LAB — POCT INR: INR: 2.3

## 2017-07-03 NOTE — Patient Instructions (Signed)
Description   Continue on same dosage 1 tablet everyday except 1/2 tablet on Tuesdays, Thursdays and Saturdays.  Recheck INR 4 weeks. Call [phone] if dentist wants you to hold Coumadin for any upcoming procedures.

## 2017-07-16 ENCOUNTER — Other Ambulatory Visit: Payer: Self-pay | Admitting: *Deleted

## 2017-07-16 DIAGNOSIS — I4891 Unspecified atrial fibrillation: Secondary | ICD-10-CM

## 2017-07-16 DIAGNOSIS — Z79899 Other long term (current) drug therapy: Secondary | ICD-10-CM

## 2017-07-16 MED ORDER — METOPROLOL TARTRATE 25 MG PO TABS: 12.5000 mg | ORAL_TABLET | Freq: Two times a day (BID) | ORAL | 3 refills | Status: DC

## 2017-07-23 ENCOUNTER — Encounter (HOSPITAL_COMMUNITY): Payer: Self-pay | Admitting: Emergency Medicine

## 2017-07-23 ENCOUNTER — Emergency Department (HOSPITAL_COMMUNITY)
Admission: EM | Admit: 2017-07-23 | Discharge: 2017-07-23 | Disposition: A | Discharge status: Home / Self Care | Payer: Medicare Other | Attending: Emergency Medicine | Admitting: Emergency Medicine

## 2017-07-23 ENCOUNTER — Ambulatory Visit (INDEPENDENT_AMBULATORY_CARE_PROVIDER_SITE_OTHER): Payer: Medicare Other | Admitting: Physician Assistant

## 2017-07-23 VITALS — BP 116/84 | HR 84 | Temp 97.4°F | Resp 16 | Ht 61.5 in | Wt 122.8 lb

## 2017-07-23 DIAGNOSIS — R1310 Dysphagia, unspecified: Secondary | ICD-10-CM | POA: Insufficient documentation

## 2017-07-23 DIAGNOSIS — Z5321 Procedure and treatment not carried out due to patient leaving prior to being seen by health care provider: Secondary | ICD-10-CM | POA: Diagnosis not present

## 2017-07-23 DIAGNOSIS — T17208A Unspecified foreign body in pharynx causing other injury, initial encounter: Secondary | ICD-10-CM

## 2017-07-23 NOTE — Progress Notes (Signed)
Subjective:    Patient ID: Lisa Hopkins, female    DOB: [birth year], 84 y.o.   MRN: [id number]  HPI 84 y.o. WF with history of afib, HTN, CVA, diastolic CHF, DM2 presents with cough.  She states this morning she took 2 pills, started to have coughing after that, feels that it is stuck.. She is coughing up white mucus, has sore throat, water or any liquid will not go down.  No fever, chills. No swelling in her legs. Her weight is down.  Wt Readings from Last 3 Encounters:  07/23/17 122 lb 12.8 oz (55.7 kg)  04/18/17 126 lb 3.2 oz (57.2 kg)  04/14/17 130 lb (59 kg)   Lab Results  Component Value Date   INR 2.3 07/03/2017   INR 2.6 06/06/2017   INR 1.8 05/23/2017    Blood pressure 116/84, pulse 84, temperature (!) 97.4 F (36.3 C), resp. rate 16, height 5' 1.5" (1.562 m), weight 122 lb 12.8 oz (55.7 kg).  Medications Current Outpatient Medications on File Prior to Visit  Medication Sig  . acetaminophen (TYLENOL) 325 MG tablet Take 325-650 mg by mouth every 4 (four) hours as needed for mild pain, moderate pain or headache.   . Cholecalciferol (VITAMIN D) 2000 UNITS CAPS Take 2,000 Units by mouth daily.   Marland Kitchen dicyclomine (BENTYL) 20 MG tablet Take 1 tablet 3 x day if needed for nausea, bloating , cramping or diarrhea  . furosemide (LASIX) 20 MG tablet Take 1 tablet daily for BP & fluid retention  . losartan (COZAAR) 50 MG tablet Take 1 tablet in the morning.  Check blood pressure at lunch.  If blood pressure is over 140/90 please give another tablet.  . Magnesium Hydroxide (MAGNESIA PO) Take 1 tablet by mouth daily.   . metoprolol tartrate (LOPRESSOR) 25 MG tablet Take 0.5 tablets (12.5 mg total) by mouth 2 (two) times daily.  . pantoprazole (PROTONIX) 40 MG tablet TAKE ONE TABLET BY MOUTH DAILY FOR ACID INDIGESTION  . potassium chloride (K-DUR) 10 MEQ tablet Take 1 tablet daily  . ranitidine (ZANTAC) 300 MG tablet TAKE ONE TABLET BY MOUTH TWICE A DAY  . warfarin (COUMADIN) 2 MG  tablet TAKE AS DIRECTED BY ANTICOAGULATION CLINIC  . QUEtiapine (SEROQUEL) 25 MG tablet Take 1 tablet at bedtime   No current facility-administered medications on file prior to visit.     Problem list She has T2_NIDDM w/ CKD2 (GFR 64 ml/min); Anxiety state; Essential hypertension; Atrial fibrillation (HCC); CVA ; APHASIA DUE TO CEREBROVASCULAR DISEASE; Hyperlipidemia, mixed; Vitamin D deficiency; Medication management; Diastolic CHF (HCC); GERD; Screening for AAA (aortic abdominal aneurysm); Bursitis of hip; Atherosclerosis of aorta (HCC); and Type 2 diabetes mellitus with diabetic neuropathy, without long-term current use of insulin (HCC) on their problem list.   Review of Systems See HPI    Objective:   Physical Exam  Constitutional: She is oriented to person, place, and time. She appears well-developed and well-nourished.  HENT:  Head: Normocephalic and atraumatic.  Right Ear: External ear normal.  Left Ear: External ear normal.  Nose: Nose normal.  Mouth/Throat: Uvula is midline. Posterior oropharyngeal edema and posterior oropharyngeal erythema present. No oropharyngeal exudate or tonsillar abscesses.  Eyes: Conjunctivae are normal. Pupils are equal, round, and reactive to light.  Neck: Normal range of motion. Neck supple.  Cardiovascular: Normal rate and regular rhythm.  Pulmonary/Chest: Effort normal. No respiratory distress. She has decreased breath sounds (LLL). She has no wheezes. She has no rales. She exhibits no  tenderness.  Abdominal: Soft. Bowel sounds are normal.  Lymphadenopathy:    She has no cervical adenopathy.  Neurological: She is alert and oriented to person, place, and time.  Skin: Skin is warm and dry.       Assessment & Plan:  ? Foreign body in esophagus Currrently 84 year old female, failed swallow study in the office Will refer to the ER to see if foreign body versus irritations Some decreased breath sounds LLL, can be atelectasis but want to also rule  out aspiration. Son will take her to ER

## 2017-07-23 NOTE — ED Triage Notes (Signed)
Pt brought in by son for getting choked on her medications (large sized tablets) this morning and reports that since she having difficulty swallowing. Patient able to maintain saliva. Patient had stroke 5 years ago and had to be on thickened liquids. Husband reports that she "has the problem all the time".

## 2017-07-27 ENCOUNTER — Other Ambulatory Visit: Payer: Self-pay | Admitting: *Deleted

## 2017-07-27 MED ORDER — RANITIDINE HCL 300 MG PO TABS: 300.0000 mg | ORAL_TABLET | Freq: Two times a day (BID) | ORAL | 1 refills | Status: DC

## 2017-08-02 ENCOUNTER — Ambulatory Visit: Payer: Self-pay | Admitting: Adult Health

## 2017-08-02 DIAGNOSIS — N182 Chronic kidney disease, stage 2 (mild): Secondary | ICD-10-CM | POA: Insufficient documentation

## 2017-08-02 DIAGNOSIS — E1122 Type 2 diabetes mellitus with diabetic chronic kidney disease: Secondary | ICD-10-CM | POA: Insufficient documentation

## 2017-08-02 NOTE — Progress Notes (Signed)
FOLLOW UP  Assessment and Plan:   Atrial Fibrillation No concerns with excessive bleeding, no falls, no missed doses or recent antibiotics Continue medication: coumadin Continue follow up at coumadin clinic and with cardiology  CHF Disease process and medications discussed. Questions answered fully. Emphasized salt restriction, less than 2000mg  a day. Encouraged daily monitoring of the patient's weight, call office if 5 lb weight loss or gain in a day.  Encouraged regular exercise. If any increasing shortness of breath, swelling, or chest pressure go to ER immediately.  decrease your fluid intake to less than 2 L daily please remember to always increase your potassium intake with any increase of your fluid pill.   Hypertension Well controlled with current medications  Monitor blood pressure at home; patient to call if consistently greater than 130/80 Continue DASH diet.   Reminder to go to the ER if any CP, SOB, nausea, dizziness, severe HA, changes vision/speech, left arm numbness and tingling and jaw pain.  Cholesterol No longer treated secondary to advancing age Continue to recommend low cholesterol diet and exercise.  Check lipid panel.   Diabetes with diabetic chronic kidney disease and with diabetic neuropathy Currently borderline with lifestyle only Continue diet and exercise.  Perform daily foot/skin check, notify office of any concerning changes.  Check A1C  Normal weight (BMI 22.87 ) Continue to recommend diet heavy in fruits and veggies and low in animal meats, cheeses, and dairy products, appropriate calorie intake Discuss exercise recommendations routinely Continue to monitor weight at each visit  Vitamin D Def Below goal at last visit; continue to recommend supplementation to maintain goal of 70-100 Check Vit D level  GERD Well managed on current medications Discussed diet, avoiding triggers and other lifestyle changes  Dysphagia/choking on  food Discussed not eating while distracted, taking small bite and chewing thoroughly, not having problems with fluids or medications. Ongoing since CVA. Will proceed with swallow study and referral for speech therapy for training.   Continue diet and meds as discussed. Further disposition pending results of labs. Discussed med's effects and SE's.   Over 30 minutes of exam, counseling, chart review, and critical decision making was performed.   Future Appointments  Date Time Provider Department Center  08/10/2017 11:45 AM [facility]  11/08/2017 11:00 AM Lucky CowboyMcKeown, William, MD [facility] None  05/27/2018  9:00 AM Lucky CowboyMcKeown, William, MD [facility] None    ----------------------------------------------------------------------------------------------------------------------  HPI 84 y.o. female  presents for 3 month follow up on hypertension, diastolic CHF, cholesterol, diabetes with CKD/neuropathy, GERD weight and vitamin D deficiency. She is on coumadin therapy for a. Fib managed by cardiology/coumadin clinic. She has ongoing aphasia/poor recall related to hx of CVA.   she has a diagnosis of GERD which is currently managed by protonix 40 mg daily, ranitidine 300 mg - she is unsure of what exactly she is taking or frequency of medication.  she reports symptoms is currently well controlled, and denies breakthrough reflux, burning in chest, hoarseness or cough, however, she reports some intermittent ongoing difficulty with swallowing during meals "get's strangled real easy" per husband. Denies difficulty with choking while drinking fluids or swallowing pills. Choking on food increasing in frequency.   She has a history of Diastolic CHF (ECHO in 2011 showed LV EF 55-60%, grade 1 diastolic dysfunction), denies dyspnea on exertion, orthopnea, paroxysmal nocturnal dyspnea and edema. Positive for none. Wt Readings from Last 3 Encounters:  08/03/17 123 lb (55.8 kg)   07/23/17 122 lb 12.8 oz (55.7 kg)  04/18/17 126 lb 3.2 oz (57.2 kg)   Her blood pressure has been controlled at home, today their BP is BP: 110/74  She does not workout. She denies chest pain, shortness of breath, dizziness.   She is not on cholesterol medication secondary to advanced age and denies myalgias. Her cholesterol is not at goal. The cholesterol last visit was:   Lab Results  Component Value Date   CHOL 221 (H) 04/18/2017   HDL 32 (L) 04/18/2017   LDLCALC 149 (H) 12/26/2016   TRIG 209 (H) 04/18/2017   CHOLHDL 6.9 (H) 04/18/2017    She has not been working on diet and exercise for borderline T2 diabetes managed by lifestyle, and denies foot ulcerations, increased appetite, nausea, paresthesia of the feet, polydipsia, polyuria, visual disturbances, vomiting and weight loss. Last A1C in the office was:  Lab Results  Component Value Date   HGBA1C 6.4 (H) 04/18/2017   Patient is on Vitamin D supplement but remained below goal of 70 at last check:    Lab Results  Component Value Date   VD25OH 49 04/18/2017    She has not increased dose as she perceives muscle weakness with higher doses.     Current Medications:  Current Outpatient Medications on File Prior to Visit  Medication Sig  . acetaminophen (TYLENOL) 325 MG tablet Take 325-650 mg by mouth every 4 (four) hours as needed for mild pain, moderate pain or headache.   . Cholecalciferol (VITAMIN D) 2000 UNITS CAPS Take 2,000 Units by mouth daily.   . furosemide (LASIX) 20 MG tablet Take 1 tablet daily for BP & fluid retention  . losartan (COZAAR) 50 MG tablet Take 1 tablet in the morning.  Check blood pressure at lunch.  If blood pressure is over 140/90 please give another tablet.  . Magnesium Hydroxide (MAGNESIA PO) Take 1 tablet by mouth daily.   . metoprolol tartrate (LOPRESSOR) 25 MG tablet Take 0.5 tablets (12.5 mg total) by mouth 2 (two) times daily.  . pantoprazole (PROTONIX) 40 MG tablet TAKE ONE TABLET BY MOUTH  DAILY FOR ACID INDIGESTION  . potassium chloride (K-DUR) 10 MEQ tablet Take 1 tablet daily  . QUEtiapine (SEROQUEL) 25 MG tablet Take 1 tablet at bedtime  . ranitidine (ZANTAC) 300 MG tablet Take 1 tablet (300 mg total) by mouth 2 (two) times daily.  Marland Kitchen warfarin (COUMADIN) 2 MG tablet TAKE AS DIRECTED BY ANTICOAGULATION CLINIC  . dicyclomine (BENTYL) 20 MG tablet Take 1 tablet 3 x day if needed for nausea, bloating , cramping or diarrhea (Patient not taking: Reported on 08/03/2017)   No current facility-administered medications on file prior to visit.      Allergies:  Allergies  Allergen Reactions  . Ace Inhibitors     Cough  . Fosamax [Alendronate Sodium]     Heart burn  . Norvasc [Amlodipine Besylate]     edema  . Zocor [Simvastatin]     Elevated CPK     Medical History:  Past Medical History:  Diagnosis Date  . Anxiety   . Aphasia as late effect of cerebrovascular accident   . Atrial fibrillation (HCC)   . CVA (cerebral infarction)   . Diabetes mellitus type II   . GERD (gastroesophageal reflux disease)   . Hiatal hernia   . HTN (hypertension)   . Hyperlipidemia   . IBS (irritable bowel syndrome)   . Stroke (HCC)   . Vitamin D deficiency    Family history- Reviewed and unchanged Social history- Reviewed  and unchanged   Review of Systems:  Review of Systems  Constitutional: Negative for malaise/fatigue and weight loss.  HENT: Negative for hearing loss, nosebleeds and tinnitus.   Eyes: Negative for blurred vision and double vision.  Respiratory: Negative for cough, shortness of breath and wheezing.   Cardiovascular: Negative for chest pain, palpitations, orthopnea, claudication and leg swelling.  Gastrointestinal: Negative for abdominal pain, blood in stool, constipation, diarrhea, heartburn, melena, nausea and vomiting.  Genitourinary: Negative.  Negative for hematuria.  Musculoskeletal: Negative for joint pain and myalgias.  Skin: Negative for rash.   Neurological: Positive for tingling (Bilateral feet). Negative for dizziness, sensory change, weakness and headaches.  Endo/Heme/Allergies: Negative for polydipsia.  Psychiatric/Behavioral: Negative.   All other systems reviewed and are negative.     Physical Exam: BP 110/74   Pulse 77   Temp 97.9 F (36.6 C)   Ht 5' 1.5" (1.562 m)   Wt 123 lb (55.8 kg)   SpO2 97%   BMI 22.86 kg/m  Wt Readings from Last 3 Encounters:  08/03/17 123 lb (55.8 kg)  07/23/17 122 lb 12.8 oz (55.7 kg)  04/18/17 126 lb 3.2 oz (57.2 kg)   General Appearance: Well nourished elderly female in no apparent distress. Eyes: PERRLA, EOMs, conjunctiva no swelling or erythema Sinuses: No Frontal/maxillary tenderness ENT/Mouth: Ext aud canals clear, TMs without erythema, bulging. No erythema, swelling, or exudate on post pharynx.  Tonsils not swollen or erythematous. Somewhat HOH with bilateral hearing aids.  Neck: Supple, thyroid normal.  Respiratory: Respiratory effort normal, BS equal bilaterally without rales, rhonchi, wheezing or stridor.  Cardio: Irregularly irregular without audible mumurs. Brisk peripheral pulses without edema.  Abdomen: Soft, + BS.  Non tender, no guarding, rebound, hernias, masses. Lymphatics: Non tender without lymphadenopathy.  Musculoskeletal: Full ROM, symmetrical strength, Normal/slow gait Skin: Warm, dry without rashes, lesions, ecchymosis.  Neuro: Cranial nerves intact. No cerebellar symptoms. Some aphasia and recall difficulty.  Psych: Awake and oriented X 3, normal affect, Insight and Judgment appropriate.    Dan Maker, NP 11:02 AM [facility]

## 2017-08-03 ENCOUNTER — Ambulatory Visit: Payer: Medicare Other | Admitting: Adult Health

## 2017-08-03 ENCOUNTER — Encounter: Payer: Self-pay | Admitting: Adult Health

## 2017-08-03 VITALS — BP 110/74 | HR 77 | Temp 97.9°F | Ht 61.5 in | Wt 123.0 lb

## 2017-08-03 DIAGNOSIS — E782 Mixed hyperlipidemia: Secondary | ICD-10-CM | POA: Diagnosis not present

## 2017-08-03 DIAGNOSIS — K219 Gastro-esophageal reflux disease without esophagitis: Secondary | ICD-10-CM

## 2017-08-03 DIAGNOSIS — E559 Vitamin D deficiency, unspecified: Secondary | ICD-10-CM

## 2017-08-03 DIAGNOSIS — Z79899 Other long term (current) drug therapy: Secondary | ICD-10-CM

## 2017-08-03 DIAGNOSIS — I10 Essential (primary) hypertension: Secondary | ICD-10-CM | POA: Diagnosis not present

## 2017-08-03 DIAGNOSIS — Z6822 Body mass index (BMI) 22.0-22.9, adult: Secondary | ICD-10-CM | POA: Diagnosis not present

## 2017-08-03 DIAGNOSIS — E1122 Type 2 diabetes mellitus with diabetic chronic kidney disease: Secondary | ICD-10-CM

## 2017-08-03 DIAGNOSIS — I5030 Unspecified diastolic (congestive) heart failure: Secondary | ICD-10-CM | POA: Diagnosis not present

## 2017-08-03 DIAGNOSIS — R1310 Dysphagia, unspecified: Secondary | ICD-10-CM

## 2017-08-03 DIAGNOSIS — I4891 Unspecified atrial fibrillation: Secondary | ICD-10-CM | POA: Diagnosis not present

## 2017-08-03 DIAGNOSIS — E1140 Type 2 diabetes mellitus with diabetic neuropathy, unspecified: Secondary | ICD-10-CM

## 2017-08-03 DIAGNOSIS — N182 Chronic kidney disease, stage 2 (mild): Secondary | ICD-10-CM | POA: Diagnosis not present

## 2017-08-03 NOTE — Patient Instructions (Signed)

## 2017-08-04 LAB — HEPATIC FUNCTION PANEL
AG Ratio: 1.5 (calc) (ref 1.0–2.5)
ALKALINE PHOSPHATASE (APISO): 108 U/L (ref 33–130)
ALT: 12 U/L (ref 6–29)
AST: 17 U/L (ref 10–35)
Albumin: 4.1 g/dL (ref 3.6–5.1)
BILIRUBIN DIRECT: 0.1 mg/dL (ref 0.0–0.2)
BILIRUBIN TOTAL: 0.7 mg/dL (ref 0.2–1.2)
Globulin: 2.7 g/dL (calc) (ref 1.9–3.7)
Indirect Bilirubin: 0.6 mg/dL (calc) (ref 0.2–1.2)
Total Protein: 6.8 g/dL (ref 6.1–8.1)

## 2017-08-04 LAB — CBC WITH DIFFERENTIAL/PLATELET
BASOS ABS: 48 {cells}/uL (ref 0–200)
Basophils Relative: 0.7 %
Eosinophils Absolute: 190 cells/uL (ref 15–500)
Eosinophils Relative: 2.8 %
HEMATOCRIT: 46.4 % — AB (ref 35.0–45.0)
Hemoglobin: 15.9 g/dL — ABNORMAL HIGH (ref 11.7–15.5)
LYMPHS ABS: 2754 {cells}/uL (ref 850–3900)
MCH: 31.4 pg (ref 27.0–33.0)
MCHC: 34.3 g/dL (ref 32.0–36.0)
MCV: 91.5 fL (ref 80.0–100.0)
MPV: 10.5 fL (ref 7.5–12.5)
Monocytes Relative: 8.7 %
NEUTROS PCT: 47.3 %
Neutro Abs: 3216 cells/uL (ref 1500–7800)
Platelets: 206 10*3/uL (ref 140–400)
RBC: 5.07 10*6/uL (ref 3.80–5.10)
RDW: 12.4 % (ref 11.0–15.0)
Total Lymphocyte: 40.5 %
WBC: 6.8 10*3/uL (ref 3.8–10.8)
WBCMIX: 592 {cells}/uL (ref 200–950)

## 2017-08-04 LAB — LIPID PANEL
Cholesterol: 213 mg/dL — ABNORMAL HIGH (ref <200)
HDL: 43 mg/dL — ABNORMAL LOW (ref >50)
LDL Cholesterol (Calc): 136 mg/dL (calc) — ABNORMAL HIGH
NON-HDL CHOLESTEROL (CALC): 170 mg/dL — AB (ref <130)
TRIGLYCERIDES: 197 mg/dL — AB (ref <150)
Total CHOL/HDL Ratio: 5 (calc) — ABNORMAL HIGH (ref <5.0)

## 2017-08-04 LAB — BASIC METABOLIC PANEL WITH GFR
BUN/Creatinine Ratio: 14 (calc) (ref 6–22)
BUN: 14 mg/dL (ref 7–25)
CO2: 31 mmol/L (ref 20–32)
Calcium: 10.1 mg/dL (ref 8.6–10.4)
Chloride: 101 mmol/L (ref 98–110)
Creat: 1 mg/dL — ABNORMAL HIGH (ref 0.60–0.88)
GFR, EST AFRICAN AMERICAN: 57 mL/min/{1.73_m2} — AB (ref >=60)
GFR, EST NON AFRICAN AMERICAN: 50 mL/min/{1.73_m2} — AB (ref >=60)
Glucose, Bld: 122 mg/dL — ABNORMAL HIGH (ref 65–99)
POTASSIUM: 4.9 mmol/L (ref 3.5–5.3)
SODIUM: 141 mmol/L (ref 135–146)

## 2017-08-04 LAB — TSH: TSH: 0.94 m[IU]/L (ref 0.40–4.50)

## 2017-08-04 LAB — VITAMIN D 25 HYDROXY (VIT D DEFICIENCY, FRACTURES): VIT D 25 HYDROXY: 50 ng/mL (ref 30–100)

## 2017-08-04 LAB — HEMOGLOBIN A1C
Hgb A1c MFr Bld: 6.4 % of total Hgb — ABNORMAL HIGH (ref <5.7)
Mean Plasma Glucose: 137 (calc)
eAG (mmol/L): 7.6 (calc)

## 2017-08-04 LAB — MAGNESIUM: Magnesium: 2 mg/dL (ref 1.5–2.5)

## 2017-08-06 ENCOUNTER — Other Ambulatory Visit: Payer: Self-pay | Admitting: Adult Health

## 2017-08-06 DIAGNOSIS — R1310 Dysphagia, unspecified: Secondary | ICD-10-CM

## 2017-08-09 ENCOUNTER — Other Ambulatory Visit: Payer: Self-pay | Admitting: Adult Health

## 2017-08-09 ENCOUNTER — Ambulatory Visit (HOSPITAL_COMMUNITY)
Admission: RE | Admit: 2017-08-09 | Discharge: 2017-08-09 | Disposition: A | Discharge status: Home / Self Care | Payer: Medicare Other | Source: Ambulatory Visit | Attending: Adult Health | Admitting: Adult Health

## 2017-08-09 DIAGNOSIS — R1310 Dysphagia, unspecified: Secondary | ICD-10-CM | POA: Diagnosis not present

## 2017-08-09 DIAGNOSIS — K222 Esophageal obstruction: Secondary | ICD-10-CM

## 2017-08-09 DIAGNOSIS — K449 Diaphragmatic hernia without obstruction or gangrene: Secondary | ICD-10-CM | POA: Insufficient documentation

## 2017-08-20 ENCOUNTER — Ambulatory Visit (INDEPENDENT_AMBULATORY_CARE_PROVIDER_SITE_OTHER): Payer: Medicare Other | Admitting: *Deleted

## 2017-08-20 DIAGNOSIS — I4891 Unspecified atrial fibrillation: Secondary | ICD-10-CM

## 2017-08-20 DIAGNOSIS — Z5181 Encounter for therapeutic drug level monitoring: Secondary | ICD-10-CM | POA: Diagnosis not present

## 2017-08-20 DIAGNOSIS — Z8673 Personal history of transient ischemic attack (TIA), and cerebral infarction without residual deficits: Secondary | ICD-10-CM

## 2017-08-20 DIAGNOSIS — Z79899 Other long term (current) drug therapy: Secondary | ICD-10-CM

## 2017-08-20 LAB — POCT INR: INR: 2.7

## 2017-08-20 NOTE — Patient Instructions (Signed)
Description   Continue on same dosage 1 tablet everyday except 1/2 tablet on Tuesdays, Thursdays and Saturdays.  Recheck INR 4 weeks. Call [phone]

## 2017-08-27 ENCOUNTER — Other Ambulatory Visit: Payer: Self-pay | Admitting: Internal Medicine

## 2017-09-08 ENCOUNTER — Other Ambulatory Visit: Payer: Self-pay | Admitting: Cardiology

## 2017-09-10 ENCOUNTER — Other Ambulatory Visit: Payer: Self-pay | Admitting: Internal Medicine

## 2017-09-10 DIAGNOSIS — I10 Essential (primary) hypertension: Secondary | ICD-10-CM

## 2017-09-18 ENCOUNTER — Ambulatory Visit (INDEPENDENT_AMBULATORY_CARE_PROVIDER_SITE_OTHER): Payer: Medicare Other | Admitting: *Deleted

## 2017-09-18 DIAGNOSIS — Z79899 Other long term (current) drug therapy: Secondary | ICD-10-CM | POA: Diagnosis not present

## 2017-09-18 DIAGNOSIS — Z5181 Encounter for therapeutic drug level monitoring: Secondary | ICD-10-CM

## 2017-09-18 DIAGNOSIS — Z8673 Personal history of transient ischemic attack (TIA), and cerebral infarction without residual deficits: Secondary | ICD-10-CM

## 2017-09-18 DIAGNOSIS — I4891 Unspecified atrial fibrillation: Secondary | ICD-10-CM

## 2017-09-18 LAB — POCT INR: INR: 2.5

## 2017-09-18 NOTE — Patient Instructions (Signed)
Description   Continue on same dosage 1 tablet everyday except 1/2 tablet on Tuesdays, Thursdays and Saturdays.  Recheck INR 3 weeks. Call [phone]

## 2017-10-03 ENCOUNTER — Other Ambulatory Visit: Payer: Self-pay | Admitting: *Deleted

## 2017-10-03 MED ORDER — QUETIAPINE FUMARATE 25 MG PO TABS: ORAL_TABLET | ORAL | 1 refills | Status: DC

## 2017-10-09 ENCOUNTER — Ambulatory Visit (INDEPENDENT_AMBULATORY_CARE_PROVIDER_SITE_OTHER): Payer: Medicare Other | Admitting: *Deleted

## 2017-10-09 DIAGNOSIS — Z8673 Personal history of transient ischemic attack (TIA), and cerebral infarction without residual deficits: Secondary | ICD-10-CM | POA: Diagnosis not present

## 2017-10-09 DIAGNOSIS — Z79899 Other long term (current) drug therapy: Secondary | ICD-10-CM

## 2017-10-09 DIAGNOSIS — I4891 Unspecified atrial fibrillation: Secondary | ICD-10-CM | POA: Diagnosis not present

## 2017-10-09 DIAGNOSIS — Z5181 Encounter for therapeutic drug level monitoring: Secondary | ICD-10-CM

## 2017-10-09 LAB — POCT INR: INR: 2.5

## 2017-10-09 NOTE — Patient Instructions (Signed)
Description   Continue on same dosage 1 tablet everyday except 1/2 tablet on Tuesdays, Thursdays and Saturdays.  Recheck INR 4 weeks. Call [phone]

## 2017-10-11 ENCOUNTER — Telehealth: Payer: Self-pay | Admitting: *Deleted

## 2017-10-11 NOTE — Telephone Encounter (Signed)
A message was left to inform the pharmacy that we received a call from [facility] stating the patient's Quetiapine 25 mg tablets have been approved.

## 2017-10-17 ENCOUNTER — Other Ambulatory Visit: Payer: Self-pay | Admitting: Internal Medicine

## 2017-10-17 ENCOUNTER — Encounter: Payer: Self-pay | Admitting: Adult Health

## 2017-10-26 ENCOUNTER — Ambulatory Visit: Payer: Medicare Other

## 2017-10-26 VITALS — BP 116/72 | Ht 61.5 in | Wt 121.2 lb

## 2017-10-26 DIAGNOSIS — Z111 Encounter for screening for respiratory tuberculosis: Secondary | ICD-10-CM | POA: Diagnosis not present

## 2017-10-26 NOTE — Progress Notes (Signed)
Pt reports today to have a PPD & fill independent and supportive living forms. PPD was place in left forearm.w/o issues. Pt was informed to report PPD results on Monday am. Pt & son voiced understanding. Forms will be faxed after PPD is reported & signed by MD.

## 2017-10-29 LAB — TB SKIN TEST: TB Skin Test: NEGATIVE

## 2017-11-06 ENCOUNTER — Ambulatory Visit (INDEPENDENT_AMBULATORY_CARE_PROVIDER_SITE_OTHER): Payer: Medicare Other | Admitting: *Deleted

## 2017-11-06 DIAGNOSIS — I4891 Unspecified atrial fibrillation: Secondary | ICD-10-CM | POA: Diagnosis not present

## 2017-11-06 DIAGNOSIS — Z79899 Other long term (current) drug therapy: Secondary | ICD-10-CM | POA: Diagnosis not present

## 2017-11-06 DIAGNOSIS — Z8673 Personal history of transient ischemic attack (TIA), and cerebral infarction without residual deficits: Secondary | ICD-10-CM | POA: Diagnosis not present

## 2017-11-06 LAB — POCT INR: INR: 2.2 (ref 2.0–3.0)

## 2017-11-06 NOTE — Patient Instructions (Addendum)
  Description   Continue on same dosage 1 tablet everyday except 1/2 tablet on Tuesdays, Thursdays and Saturdays.  Recheck INR 6 weeks. Call us at [facility] # [phone], Main # [phone].

## 2017-11-08 ENCOUNTER — Encounter: Payer: Self-pay | Admitting: Internal Medicine

## 2017-11-08 ENCOUNTER — Ambulatory Visit (INDEPENDENT_AMBULATORY_CARE_PROVIDER_SITE_OTHER): Payer: Medicare Other | Admitting: Internal Medicine

## 2017-11-08 VITALS — BP 122/80 | HR 72 | Temp 97.1°F | Resp 16 | Ht 61.5 in | Wt 121.6 lb

## 2017-11-08 DIAGNOSIS — Z79899 Other long term (current) drug therapy: Secondary | ICD-10-CM

## 2017-11-08 DIAGNOSIS — E559 Vitamin D deficiency, unspecified: Secondary | ICD-10-CM

## 2017-11-08 DIAGNOSIS — I10 Essential (primary) hypertension: Secondary | ICD-10-CM

## 2017-11-08 DIAGNOSIS — I482 Chronic atrial fibrillation, unspecified: Secondary | ICD-10-CM

## 2017-11-08 DIAGNOSIS — E1122 Type 2 diabetes mellitus with diabetic chronic kidney disease: Secondary | ICD-10-CM | POA: Diagnosis not present

## 2017-11-08 DIAGNOSIS — K589 Irritable bowel syndrome without diarrhea: Secondary | ICD-10-CM | POA: Diagnosis not present

## 2017-11-08 DIAGNOSIS — K219 Gastro-esophageal reflux disease without esophagitis: Secondary | ICD-10-CM

## 2017-11-08 DIAGNOSIS — N182 Chronic kidney disease, stage 2 (mild): Secondary | ICD-10-CM | POA: Diagnosis not present

## 2017-11-08 DIAGNOSIS — E782 Mixed hyperlipidemia: Secondary | ICD-10-CM | POA: Diagnosis not present

## 2017-11-08 NOTE — Patient Instructions (Signed)

## 2017-11-08 NOTE — Progress Notes (Signed)
This very nice 84 y.o. recently Widowed WF  presents for 6 month follow up with HTN, ASCVD/CVAs, HLD, PT2_NIDDM and Vitamin D Deficiency.      Patient is treated for HTN (1992) & BP has been controlled at home. Patient had an embolic CVA in 2011 and has been on Coumadin since. Patient is considered a HIGH FALL RISK for Coumadin.  Residua of her stroke is anomia, poor short term recall, dulled insight & cognition.  She does not/ or will not consistently use her walker.  Today's BP is at goal - 122/80. Patient has had no complaints of any cardiac type chest pain, palpitations, dyspnea / orthopnea / PND, dizziness, claudication, or dependent edema.     Hyperlipidemia is not controlled with diet & meds are deferred due to advanced age. Last Lipids were not at goal: Lab Results  Component Value Date   CHOL 213 (H) 08/03/2017   HDL 43 (L) 08/03/2017   LDLCALC 136 (H) 08/03/2017   TRIG 197 (H) 08/03/2017   CHOLHDL 5.0 (H) 08/03/2017      Also, the patient has history of T2_NIDDM PreDiabetes and has had no symptoms of reactive hypoglycemia, diabetic polys, paresthesias or visual blurring.  Last A1c was  Lab Results  Component Value Date   HGBA1C 6.4 (H) 08/03/2017      Further, the patient also has history of Vitamin D Deficiency and supplements vitamin D without any suspected side-effects. Last vitamin D was not at goal (70-100): Lab Results  Component Value Date   VD25OH 50 08/03/2017   Current Outpatient Medications on File Prior to Visit  Medication Sig  . acetaminophen (TYLENOL) 325 MG tablet Take 325-650 mg by mouth every 4 (four) hours as needed for mild pain, moderate pain or headache.   . Cholecalciferol (VITAMIN D) 2000 UNITS CAPS Take 2,000 Units by mouth daily.   Marland Kitchen dicyclomine (BENTYL) 20 MG tablet Take 1 tablet 3 x day if needed for nausea, bloating , cramping or diarrhea  . furosemide (LASIX) 20 MG tablet Take 1 tablet daily for BP & fluid retention  . losartan (COZAAR) 50  MG tablet TAKE 1 TABLET EVERY MORNING, CHECK BP AT LUNCH-IF BP ABOVE 140/90 TAKE ANOTHER TABLET.  . Magnesium Hydroxide (MAGNESIA PO) Take 1 tablet by mouth daily.   . pantoprazole (PROTONIX) 40 MG tablet TAKE 1 TABLET ONCE DAILY FOR REFLUX.  Marland Kitchen potassium chloride (K-DUR) 10 MEQ tablet Take 1 tablet daily  . potassium chloride (K-DUR) 10 MEQ tablet TAKE 1 TABLET BY MOUTH DAILY.  Marland Kitchen QUEtiapine (SEROQUEL) 25 MG tablet Take 1 tablet at bedtime  . ranitidine (ZANTAC) 300 MG tablet Take 1 tablet (300 mg total) by mouth 2 (two) times daily.  Marland Kitchen warfarin (COUMADIN) 2 MG tablet TAKE AS DIRECTED BY ANTICOAGULATION CLINIC.  Marland Kitchen metoprolol tartrate (LOPRESSOR) 25 MG tablet Take 0.5 tablets (12.5 mg total) by mouth 2 (two) times daily.   No current facility-administered medications on file prior to visit.    Allergies  Allergen Reactions  . Ace Inhibitors     Cough  . Fosamax [Alendronate Sodium]     Heart burn  . Norvasc [Amlodipine Besylate]     edema  . Zocor [Simvastatin]     Elevated CPK   PMHx:   Past Medical History:  Diagnosis Date  . Anxiety   . Aphasia as late effect of cerebrovascular accident   . Atrial fibrillation (HCC)   . CVA (cerebral infarction)   . Diabetes  mellitus type II   . GERD (gastroesophageal reflux disease)   . Hiatal hernia   . HTN (hypertension)   . Hyperlipidemia   . IBS (irritable bowel syndrome)   . Stroke (HCC)   . Vitamin D deficiency    Immunization History  Administered Date(s) Administered  . DT 02/18/2015  . Influenza, High Dose Seasonal PF 03/05/2014, 02/18/2015, 03/16/2016, 03/05/2017  . Influenza-Unspecified 01/30/2013  . PPD Test 10/26/2017  . Pneumococcal-Unspecified 04/27/2004  . Td 06/28/2003  . Zoster 04/27/2009   Past Surgical History:  Procedure Laterality Date  . CATARACT EXTRACTION, BILATERAL    . CHOLECYSTECTOMY    . COLONOSCOPY  2005  . TONSILLECTOMY    . UPPER GASTROINTESTINAL ENDOSCOPY  2005   FHx:    Reviewed /  unchanged  SHx:    Reviewed / unchanged   Systems Review:  Constitutional: Denies fever, chills, wt changes, headaches, insomnia, fatigue, night sweats, change in appetite. Eyes: Denies redness, blurred vision, diplopia, discharge, itchy, watery eyes.  ENT: Denies discharge, congestion, post nasal drip, epistaxis, sore throat, earache, hearing loss, dental pain, tinnitus, vertigo, sinus pain, snoring.  CV: Denies chest pain, palpitations, irregular heartbeat, syncope, dyspnea, diaphoresis, orthopnea, PND, claudication or edema. Respiratory: denies cough, dyspnea, DOE, pleurisy, hoarseness, laryngitis, wheezing.  Gastrointestinal: Denies dysphagia, odynophagia, heartburn, reflux, water brash, abdominal pain or cramps, nausea, vomiting, bloating, diarrhea, constipation, hematemesis, melena, hematochezia  or hemorrhoids. Genitourinary: Denies dysuria, frequency, urgency, nocturia, hesitancy, discharge, hematuria or flank pain. Musculoskeletal: Denies arthralgias, myalgias, stiffness, jt. swelling, pain, limping or strain/sprain.  Skin: Denies pruritus, rash, hives, warts, acne, eczema or change in skin lesion(s). Neuro: No weakness, tremor, incoordination, spasms, paresthesia or pain. Psychiatric: Denies confusion, memory loss or sensory loss. Endo: Denies change in weight, skin or hair change.  Heme/Lymph: No excessive bleeding, bruising or enlarged lymph nodes.  Physical Exam  BP 122/80   Pulse 72   Temp (!) 97.1 F (36.2 C)   Resp 16   Ht 5' 1.5" (1.562 m)   Wt 121 lb 9.6 oz (55.2 kg)   BMI 22.60 kg/m   Appears  well nourished, well groomed  and in no distress.  Eyes: PERRLA, EOMs, conjunctiva no swelling or erythema. Sinuses: No frontal/maxillary tenderness ENT/Mouth: EAC's clear, TM's nl w/o erythema, bulging. Nares clear w/o erythema, swelling, exudates. Oropharynx clear without erythema or exudates. Oral hygiene is good. Tongue normal, non obstructing. Hearing intact.  Neck:  Supple. Thyroid not palpable. Car 2+/2+ without bruits, nodes or JVD. Chest: Respirations nl with BS clear & equal w/o rales, rhonchi, wheezing or stridor.  Cor: Heart sounds normal w/ regular rate and rhythm without sig. murmurs, gallops, clicks or rubs. Peripheral pulses normal and equal  without edema.  Abdomen: Soft & bowel sounds normal. Non-tender w/o guarding, rebound, hernias, masses or organomegaly.  Lymphatics: Unremarkable.  Musculoskeletal: Full ROM all peripheral extremities, joint stability, 5/5 strength and normal gait.  Skin: Warm, dry without exposed rashes, lesions or ecchymosis apparent.  Neuro: Cranial nerves intact, reflexes equal bilaterally. Sensory-motor testing grossly intact. Tendon reflexes grossly intact.  Pysch: Alert & oriented x 3.  Insight and judgement nl & appropriate. No ideations.  Assessment and Plan:  1. Essential hypertension  - Continue medication, monitor blood pressure at home.  - Continue DASH diet.  Reminder to go to the ER if any CP,  SOB, nausea, dizziness, severe HA, changes vision/speech.  - CBC with Differential/Platelet - COMPLETE METABOLIC PANEL WITH GFR - Magnesium  2. Hyperlipidemia, mixed  -  Continue diet/meds, exercise,& lifestyle modifications.  - Continue monitor periodic cholesterol/liver & renal functions   - TSH  3. Type 2 diabetes mellitus with stage 2 chronic kidney disease, without long-term current use of insulin (HCC)  - Continue diet, exercise, lifestyle modifications.  - Monitor appropriate labs.  - Hemoglobin A1c - Insulin, random  4. Vitamin D deficiency  - Continue supplementation.  - VITAMIN D 25 Hydroxyl  5. Gastroesophageal reflux disease, esophagitis presence not specified  - CBC with Differential/Platelet  6. Chronic atrial fibrillation (HCC)  - TSH  7. Irritable bowel syndrome, unspecified type   8. Medication management  - CBC with Differential/Platelet - COMPLETE METABOLIC PANEL WITH  GFR - Magnesium - TSH - Hemoglobin A1c - Insulin, random - VITAMIN D 25 Hydroxyl               Discussed  regular exercise, BP monitoring, weight control to achieve/maintain BMI less than 25 and discussed med and SE's. Recommended labs to assess and monitor clinical status with further disposition pending results of labs. Over 30 minutes of exam, counseling, chart review was performed.

## 2017-11-09 LAB — HEMOGLOBIN A1C
Hgb A1c MFr Bld: 6.2 % of total Hgb — ABNORMAL HIGH (ref <5.7)
Mean Plasma Glucose: 131 (calc)
eAG (mmol/L): 7.3 (calc)

## 2017-11-09 LAB — COMPLETE METABOLIC PANEL WITH GFR
AG RATIO: 1.3 (calc) (ref 1.0–2.5)
ALT: 11 U/L (ref 6–29)
AST: 16 U/L (ref 10–35)
Albumin: 3.9 g/dL (ref 3.6–5.1)
Alkaline phosphatase (APISO): 119 U/L (ref 33–130)
BUN/Creatinine Ratio: 15 (calc) (ref 6–22)
BUN: 15 mg/dL (ref 7–25)
CALCIUM: 9.9 mg/dL (ref 8.6–10.4)
CO2: 28 mmol/L (ref 20–32)
Chloride: 103 mmol/L (ref 98–110)
Creat: 1.03 mg/dL — ABNORMAL HIGH (ref 0.60–0.88)
GFR, EST AFRICAN AMERICAN: 55 mL/min/{1.73_m2} — AB (ref >=60)
GFR, Est Non African American: 48 mL/min/{1.73_m2} — ABNORMAL LOW (ref >=60)
GLUCOSE: 135 mg/dL — AB (ref 65–99)
Globulin: 2.9 g/dL (calc) (ref 1.9–3.7)
POTASSIUM: 4.3 mmol/L (ref 3.5–5.3)
Sodium: 140 mmol/L (ref 135–146)
TOTAL PROTEIN: 6.8 g/dL (ref 6.1–8.1)
Total Bilirubin: 0.6 mg/dL (ref 0.2–1.2)

## 2017-11-09 LAB — CBC WITH DIFFERENTIAL/PLATELET
BASOS PCT: 0.9 %
Basophils Absolute: 62 cells/uL (ref 0–200)
EOS PCT: 2.3 %
Eosinophils Absolute: 159 cells/uL (ref 15–500)
HCT: 45.6 % — ABNORMAL HIGH (ref 35.0–45.0)
Hemoglobin: 15.5 g/dL (ref 11.7–15.5)
Lymphs Abs: 2401 cells/uL (ref 850–3900)
MCH: 31.1 pg (ref 27.0–33.0)
MCHC: 34 g/dL (ref 32.0–36.0)
MCV: 91.4 fL (ref 80.0–100.0)
MONOS PCT: 7.8 %
MPV: 10 fL (ref 7.5–12.5)
NEUTROS PCT: 54.2 %
Neutro Abs: 3740 cells/uL (ref 1500–7800)
PLATELETS: 216 10*3/uL (ref 140–400)
RBC: 4.99 10*6/uL (ref 3.80–5.10)
RDW: 12.4 % (ref 11.0–15.0)
TOTAL LYMPHOCYTE: 34.8 %
WBC mixed population: 538 cells/uL (ref 200–950)
WBC: 6.9 10*3/uL (ref 3.8–10.8)

## 2017-11-09 LAB — VITAMIN D 25 HYDROXY (VIT D DEFICIENCY, FRACTURES): VIT D 25 HYDROXY: 53 ng/mL (ref 30–100)

## 2017-11-09 LAB — INSULIN, RANDOM: Insulin: 22.4 u[IU]/mL — ABNORMAL HIGH (ref 2.0–19.6)

## 2017-11-09 LAB — TSH: TSH: 1.11 m[IU]/L (ref 0.40–4.50)

## 2017-11-09 LAB — MAGNESIUM: Magnesium: 2.1 mg/dL (ref 1.5–2.5)

## 2017-11-11 ENCOUNTER — Encounter: Payer: Self-pay | Admitting: Internal Medicine

## 2017-11-14 ENCOUNTER — Other Ambulatory Visit: Payer: Self-pay | Admitting: Adult Health

## 2017-11-14 DIAGNOSIS — I10 Essential (primary) hypertension: Secondary | ICD-10-CM

## 2017-11-21 DIAGNOSIS — M6281 Muscle weakness (generalized): Secondary | ICD-10-CM | POA: Diagnosis not present

## 2017-11-21 DIAGNOSIS — R2681 Unsteadiness on feet: Secondary | ICD-10-CM | POA: Diagnosis not present

## 2017-11-21 DIAGNOSIS — R278 Other lack of coordination: Secondary | ICD-10-CM | POA: Diagnosis not present

## 2017-11-21 DIAGNOSIS — R262 Difficulty in walking, not elsewhere classified: Secondary | ICD-10-CM | POA: Diagnosis not present

## 2017-11-22 DIAGNOSIS — R262 Difficulty in walking, not elsewhere classified: Secondary | ICD-10-CM | POA: Diagnosis not present

## 2017-11-22 DIAGNOSIS — R278 Other lack of coordination: Secondary | ICD-10-CM | POA: Diagnosis not present

## 2017-11-22 DIAGNOSIS — R2681 Unsteadiness on feet: Secondary | ICD-10-CM | POA: Diagnosis not present

## 2017-11-22 DIAGNOSIS — M6281 Muscle weakness (generalized): Secondary | ICD-10-CM | POA: Diagnosis not present

## 2017-11-23 DIAGNOSIS — R2681 Unsteadiness on feet: Secondary | ICD-10-CM | POA: Diagnosis not present

## 2017-11-23 DIAGNOSIS — R262 Difficulty in walking, not elsewhere classified: Secondary | ICD-10-CM | POA: Diagnosis not present

## 2017-11-23 DIAGNOSIS — R278 Other lack of coordination: Secondary | ICD-10-CM | POA: Diagnosis not present

## 2017-11-23 DIAGNOSIS — M6281 Muscle weakness (generalized): Secondary | ICD-10-CM | POA: Diagnosis not present

## 2017-11-24 ENCOUNTER — Emergency Department (HOSPITAL_COMMUNITY)
Admission: EM | Admit: 2017-11-24 | Discharge: 2017-11-25 | Disposition: A | Discharge status: Home / Self Care | Payer: Medicare Other | Source: Home / Self Care | Attending: Emergency Medicine | Admitting: Emergency Medicine

## 2017-11-24 ENCOUNTER — Emergency Department (HOSPITAL_COMMUNITY): Payer: Medicare Other

## 2017-11-24 DIAGNOSIS — I129 Hypertensive chronic kidney disease with stage 1 through stage 4 chronic kidney disease, or unspecified chronic kidney disease: Secondary | ICD-10-CM | POA: Diagnosis not present

## 2017-11-24 DIAGNOSIS — K219 Gastro-esophageal reflux disease without esophagitis: Secondary | ICD-10-CM | POA: Diagnosis not present

## 2017-11-24 DIAGNOSIS — R410 Disorientation, unspecified: Secondary | ICD-10-CM | POA: Diagnosis not present

## 2017-11-24 DIAGNOSIS — F419 Anxiety disorder, unspecified: Secondary | ICD-10-CM | POA: Diagnosis not present

## 2017-11-24 DIAGNOSIS — E1140 Type 2 diabetes mellitus with diabetic neuropathy, unspecified: Secondary | ICD-10-CM | POA: Diagnosis not present

## 2017-11-24 DIAGNOSIS — Z9049 Acquired absence of other specified parts of digestive tract: Secondary | ICD-10-CM | POA: Diagnosis not present

## 2017-11-24 DIAGNOSIS — Z8673 Personal history of transient ischemic attack (TIA), and cerebral infarction without residual deficits: Secondary | ICD-10-CM | POA: Diagnosis not present

## 2017-11-24 DIAGNOSIS — R4182 Altered mental status, unspecified: Secondary | ICD-10-CM | POA: Insufficient documentation

## 2017-11-24 DIAGNOSIS — R1011 Right upper quadrant pain: Secondary | ICD-10-CM | POA: Diagnosis not present

## 2017-11-24 DIAGNOSIS — R197 Diarrhea, unspecified: Secondary | ICD-10-CM | POA: Diagnosis not present

## 2017-11-24 DIAGNOSIS — E785 Hyperlipidemia, unspecified: Secondary | ICD-10-CM | POA: Diagnosis not present

## 2017-11-24 DIAGNOSIS — R1010 Upper abdominal pain, unspecified: Secondary | ICD-10-CM

## 2017-11-24 DIAGNOSIS — I10 Essential (primary) hypertension: Secondary | ICD-10-CM | POA: Diagnosis not present

## 2017-11-24 DIAGNOSIS — Z8261 Family history of arthritis: Secondary | ICD-10-CM | POA: Diagnosis not present

## 2017-11-24 DIAGNOSIS — Z9841 Cataract extraction status, right eye: Secondary | ICD-10-CM | POA: Diagnosis not present

## 2017-11-24 DIAGNOSIS — I482 Chronic atrial fibrillation: Secondary | ICD-10-CM | POA: Diagnosis not present

## 2017-11-24 DIAGNOSIS — N182 Chronic kidney disease, stage 2 (mild): Secondary | ICD-10-CM | POA: Diagnosis not present

## 2017-11-24 DIAGNOSIS — K760 Fatty (change of) liver, not elsewhere classified: Secondary | ICD-10-CM | POA: Diagnosis not present

## 2017-11-24 DIAGNOSIS — K589 Irritable bowel syndrome without diarrhea: Secondary | ICD-10-CM | POA: Diagnosis not present

## 2017-11-24 DIAGNOSIS — Z833 Family history of diabetes mellitus: Secondary | ICD-10-CM | POA: Diagnosis not present

## 2017-11-24 DIAGNOSIS — E1122 Type 2 diabetes mellitus with diabetic chronic kidney disease: Secondary | ICD-10-CM | POA: Diagnosis not present

## 2017-11-24 DIAGNOSIS — Z8249 Family history of ischemic heart disease and other diseases of the circulatory system: Secondary | ICD-10-CM | POA: Diagnosis not present

## 2017-11-24 DIAGNOSIS — I4891 Unspecified atrial fibrillation: Secondary | ICD-10-CM | POA: Diagnosis not present

## 2017-11-24 DIAGNOSIS — Z794 Long term (current) use of insulin: Secondary | ICD-10-CM | POA: Diagnosis not present

## 2017-11-24 DIAGNOSIS — E559 Vitamin D deficiency, unspecified: Secondary | ICD-10-CM | POA: Diagnosis not present

## 2017-11-24 DIAGNOSIS — K222 Esophageal obstruction: Secondary | ICD-10-CM | POA: Diagnosis not present

## 2017-11-24 DIAGNOSIS — E876 Hypokalemia: Secondary | ICD-10-CM | POA: Diagnosis not present

## 2017-11-24 DIAGNOSIS — Z7901 Long term (current) use of anticoagulants: Secondary | ICD-10-CM

## 2017-11-24 DIAGNOSIS — Z888 Allergy status to other drugs, medicaments and biological substances status: Secondary | ICD-10-CM | POA: Diagnosis not present

## 2017-11-24 DIAGNOSIS — K449 Diaphragmatic hernia without obstruction or gangrene: Secondary | ICD-10-CM

## 2017-11-24 DIAGNOSIS — R112 Nausea with vomiting, unspecified: Secondary | ICD-10-CM | POA: Diagnosis not present

## 2017-11-24 DIAGNOSIS — Z66 Do not resuscitate: Secondary | ICD-10-CM | POA: Diagnosis not present

## 2017-11-24 DIAGNOSIS — Z823 Family history of stroke: Secondary | ICD-10-CM | POA: Diagnosis not present

## 2017-11-24 DIAGNOSIS — Z79899 Other long term (current) drug therapy: Secondary | ICD-10-CM | POA: Insufficient documentation

## 2017-11-24 DIAGNOSIS — R1013 Epigastric pain: Secondary | ICD-10-CM | POA: Diagnosis not present

## 2017-11-24 DIAGNOSIS — Z9842 Cataract extraction status, left eye: Secondary | ICD-10-CM | POA: Diagnosis not present

## 2017-11-24 DIAGNOSIS — R1084 Generalized abdominal pain: Secondary | ICD-10-CM | POA: Diagnosis not present

## 2017-11-24 LAB — PROTIME-INR
INR: 2.18
PROTHROMBIN TIME: 24.1 s — AB (ref 11.4–15.2)

## 2017-11-24 LAB — CBC
HCT: 47.3 % — ABNORMAL HIGH (ref 36.0–46.0)
Hemoglobin: 15.1 g/dL — ABNORMAL HIGH (ref 12.0–15.0)
MCH: 31.2 pg (ref 26.0–34.0)
MCHC: 31.9 g/dL (ref 30.0–36.0)
MCV: 97.7 fL (ref 78.0–100.0)
PLATELETS: 204 10*3/uL (ref 150–400)
RBC: 4.84 MIL/uL (ref 3.87–5.11)
RDW: 12.8 % (ref 11.5–15.5)
WBC: 7.9 10*3/uL (ref 4.0–10.5)

## 2017-11-24 LAB — COMPREHENSIVE METABOLIC PANEL
ALK PHOS: 104 U/L (ref 38–126)
ALT: 15 U/L (ref 0–44)
AST: 23 U/L (ref 15–41)
Albumin: 3.7 g/dL (ref 3.5–5.0)
Anion gap: 12 (ref 5–15)
BILIRUBIN TOTAL: 1 mg/dL (ref 0.3–1.2)
BUN: 20 mg/dL (ref 8–23)
CALCIUM: 9.4 mg/dL (ref 8.9–10.3)
CHLORIDE: 102 mmol/L (ref 98–111)
CO2: 26 mmol/L (ref 22–32)
CREATININE: 1.2 mg/dL — AB (ref 0.44–1.00)
GFR calc non Af Amer: 39 mL/min — ABNORMAL LOW (ref >60)
GFR, EST AFRICAN AMERICAN: 45 mL/min — AB (ref >60)
Glucose, Bld: 140 mg/dL — ABNORMAL HIGH (ref 70–99)
Potassium: 3.9 mmol/L (ref 3.5–5.1)
Sodium: 140 mmol/L (ref 135–145)
Total Protein: 6.5 g/dL (ref 6.5–8.1)

## 2017-11-24 LAB — URINALYSIS, ROUTINE W REFLEX MICROSCOPIC
Bilirubin Urine: NEGATIVE
GLUCOSE, UA: 50 mg/dL — AB
HGB URINE DIPSTICK: NEGATIVE
KETONES UR: NEGATIVE mg/dL
LEUKOCYTES UA: NEGATIVE
Nitrite: NEGATIVE
PROTEIN: NEGATIVE mg/dL
Specific Gravity, Urine: 1.03 (ref 1.005–1.030)
pH: 7 (ref 5.0–8.0)

## 2017-11-24 LAB — LIPASE, BLOOD: Lipase: 55 U/L — ABNORMAL HIGH (ref 11–51)

## 2017-11-24 LAB — TROPONIN I: Troponin I: <0.03 ng/mL (ref <0.03)

## 2017-11-24 MED ORDER — GI COCKTAIL ~~LOC~~
30.0000 mL | Freq: Once | ORAL | Status: AC
  Administered 2017-11-24: 30 mL via ORAL
  Filled 2017-11-24: qty 30

## 2017-11-24 MED ORDER — SODIUM CHLORIDE 0.9 % IV BOLUS
500.0000 mL | Freq: Once | INTRAVENOUS | Status: AC
  Administered 2017-11-24: 500 mL via INTRAVENOUS

## 2017-11-24 MED ORDER — ONDANSETRON HCL 4 MG/2ML IJ SOLN
4.0000 mg | Freq: Once | INTRAMUSCULAR | Status: AC
  Administered 2017-11-24: 4 mg via INTRAVENOUS
  Filled 2017-11-24: qty 2

## 2017-11-24 MED ORDER — IOHEXOL 300 MG/ML  SOLN
100.0000 mL | Freq: Once | INTRAMUSCULAR | Status: AC | PRN
  Administered 2017-11-24: 100 mL via INTRAVENOUS

## 2017-11-24 MED ORDER — FENTANYL CITRATE (PF) 100 MCG/2ML IJ SOLN
50.0000 ug | Freq: Once | INTRAMUSCULAR | Status: AC
  Administered 2017-11-24: 50 ug via INTRAVENOUS
  Filled 2017-11-24: qty 2

## 2017-11-24 MED ORDER — SUCRALFATE 1 G PO TABS: 1.0000 g | ORAL_TABLET | Freq: Three times a day (TID) | ORAL | 0 refills | Status: DC

## 2017-11-24 NOTE — ED Notes (Signed)
Pt given diet ginger ale for PO Challenge

## 2017-11-24 NOTE — ED Notes (Signed)
Patient transported to CT 

## 2017-11-24 NOTE — ED Notes (Signed)
Labels sent to main lab to run trop and pt blood test.

## 2017-11-24 NOTE — ED Notes (Signed)
Pt back from CT

## 2017-11-24 NOTE — ED Provider Notes (Signed)
[facility] EMERGENCY DEPARTMENT Provider Note   CSN: [id number] Arrival date & time: 11/24/17  1933     History   Chief Complaint Chief Complaint  Patient presents with  . Abdominal Pain  . Altered Mental Status    HPI Lisa Hopkins is a 84 y.o. female.  HPI Patient presents with acute onset upper abdominal pain while eating.  Had episode of loose stool without blood.  Noted to be pale and diaphoretic by EMS.  Sons at bedside state patient  has had similar symptoms in the past.  Had nausea but no vomiting.  No fever or chills.  Patient is at her baseline mental status. Past Medical History:  Diagnosis Date  . Anxiety   . Aphasia as late effect of cerebrovascular accident   . Atrial fibrillation (HCC)   . CVA (cerebral infarction)   . Diabetes mellitus type II   . GERD (gastroesophageal reflux disease)   . Hiatal hernia   . HTN (hypertension)   . Hyperlipidemia   . IBS (irritable bowel syndrome)   . Stroke (HCC)   . Vitamin D deficiency     Patient Active Problem List   Diagnosis Date Noted  . CKD stage 2 due to type 2 diabetes mellitus (HCC) 08/02/2017  . Type 2 diabetes mellitus with diabetic neuropathy, without long-term current use of insulin (HCC) 12/25/2016  . Atherosclerosis of aorta (HCC) 12/03/2015  . GERD 09/25/2014  . Diastolic CHF (HCC) 08/04/2014  . Bursitis of hip 03/03/2014  . Medication management 07/01/2013  . Hyperlipidemia, mixed   . Vitamin D deficiency   . T2_NIDDM w/ CKD2 (GFR 64 ml/min) 03/15/2010  . Anxiety state 03/15/2010  . Essential hypertension 03/15/2010  . Atrial fibrillation (HCC) 03/15/2010  . APHASIA DUE TO CEREBROVASCULAR DISEASE 03/15/2010  . History of CVA (cerebrovascular accident) 03/07/2010    Past Surgical History:  Procedure Laterality Date  . CATARACT EXTRACTION, BILATERAL    . CHOLECYSTECTOMY    . COLONOSCOPY  2005  . TONSILLECTOMY    . UPPER GASTROINTESTINAL ENDOSCOPY  2005     OB  History   None      Home Medications    Prior to Admission medications   Medication Sig Start Date End Date Taking? Authorizing Provider  acetaminophen (TYLENOL) 325 MG tablet Take 325-650 mg by mouth every 4 (four) hours as needed for mild pain, moderate pain or headache.     [provider]  Cholecalciferol (VITAMIN D) 2000 UNITS CAPS Take 2,000 Units by mouth daily.     [provider]  dicyclomine (BENTYL) 20 MG tablet Take 1 tablet 3 x day if needed for nausea, bloating , cramping or diarrhea 06/24/15   Lucky Cowboy, MD  furosemide (LASIX) 20 MG tablet Take 1 tablet daily for BP & fluid retention 05/22/17 11/20/17  Lucky Cowboy, MD  losartan (COZAAR) 50 MG tablet TAKE 1 TABLET EVERY MORNING, CHECK BP AT LUNCH-IF BP ABOVE 140/90 TAKE ANOTHER TABLET. 11/14/17   Judd Gaudier, NP  Magnesium Hydroxide (MAGNESIA PO) Take 1 tablet by mouth daily.     [provider]  metoprolol tartrate (LOPRESSOR) 25 MG tablet Take 0.5 tablets (12.5 mg total) by mouth 2 (two) times daily. 07/16/17 10/14/17  Lucky Cowboy, MD  pantoprazole (PROTONIX) 40 MG tablet TAKE 1 TABLET ONCE DAILY FOR REFLUX. 08/27/17   Judd Gaudier, NP  potassium chloride (K-DUR) 10 MEQ tablet Take 1 tablet daily 04/15/17 11/13/17  Lucky Cowboy, MD  potassium chloride (K-DUR)  10 MEQ tablet TAKE 1 TABLET BY MOUTH DAILY. 10/17/17   Lucky Cowboy, MD  QUEtiapine (SEROQUEL) 25 MG tablet Take 1 tablet at bedtime 10/03/17 09/24/18  Lucky Cowboy, MD  ranitidine (ZANTAC) 300 MG tablet Take 1 tablet (300 mg total) by mouth 2 (two) times daily. 07/27/17   Lucky Cowboy, MD  sucralfate (CARAFATE) 1 g tablet Take 1 tablet (1 g total) by mouth 4 (four) times daily -  with meals and at bedtime. 11/24/17   Loren Racer, MD  warfarin (COUMADIN) 2 MG tablet TAKE AS DIRECTED BY ANTICOAGULATION CLINIC. 09/10/17   Lars Masson, MD    Family History Family History  Problem Relation Age of Onset  .  Diabetes Mother   . Stroke Mother   . Hypertension Brother   . Heart disease Brother   . Arthritis Sister        Rhematoid  . Arthritis Sister        Rhematoid    Social History Social History   Tobacco Use  . Smoking status: Never Smoker  . Smokeless tobacco: Never Used  Substance Use Topics  . Alcohol use: No  . Drug use: No     Allergies   Ace inhibitors; Fosamax [alendronate sodium]; Norvasc [amlodipine besylate]; and Zocor [simvastatin]   Review of Systems Review of Systems  Constitutional: Positive for diaphoresis. Negative for chills and fever.  HENT: Negative for trouble swallowing.   Respiratory: Negative for cough and shortness of breath.   Cardiovascular: Negative for chest pain.  Gastrointestinal: Positive for abdominal pain and diarrhea. Negative for constipation, nausea and vomiting.  Genitourinary: Negative for dysuria, flank pain and frequency.  Musculoskeletal: Negative for back pain, myalgias, neck pain and neck stiffness.  Skin: Negative for rash and wound.  Neurological: Negative for dizziness, weakness, light-headedness, numbness and headaches.  Psychiatric/Behavioral: The patient is nervous/anxious.   All other systems reviewed and are negative.    Physical Exam Updated Vital Signs BP (!) 168/107   Pulse 69   Temp 97.8 F (36.6 C) (Rectal)   Resp 18   Ht 5' 1.5" (1.562 m)   Wt 54.9 kg (121 lb)   SpO2 96%   BMI 22.49 kg/m   Physical Exam  Constitutional: She is oriented to person, place, and time. She appears well-developed and well-nourished. No distress.  Anxious appearing  HENT:  Head: Normocephalic and atraumatic.  Mouth/Throat: Oropharynx is clear and moist. No oropharyngeal exudate.  Eyes: Pupils are equal, round, and reactive to light. EOM are normal.  Neck: Normal range of motion. Neck supple. No JVD present.  Cardiovascular: Normal rate and regular rhythm. Exam reveals no gallop and no friction rub.  No murmur  heard. Pulmonary/Chest: Effort normal and breath sounds normal. No stridor. No respiratory distress. She has no wheezes. She has no rales. She exhibits no tenderness.  Abdominal: Soft. Bowel sounds are normal. There is tenderness. There is no rebound and no guarding.  Epigastric and right upper quadrant tenderness to palpation.  No rebound or guarding.  Musculoskeletal: Normal range of motion. She exhibits no edema or tenderness.  No lower extremity swelling, asymmetry or tenderness.  No midline thoracic or lumbar tenderness.  No CVA tenderness.  Lymphadenopathy:    She has no cervical adenopathy.  Neurological: She is alert and oriented to person, place, and time.  Moves all extremities without focal deficit.  Sensation intact.  Skin: Skin is warm. Capillary refill takes less than 2 seconds. No rash noted. She is not diaphoretic.  No erythema.  Psychiatric: She has a normal mood and affect. Her behavior is normal.  Nursing note and vitals reviewed.    ED Treatments / Results  Labs (all labs ordered are listed, but only abnormal results are displayed) Labs Reviewed  LIPASE, BLOOD - Abnormal; Notable for the following components:      Result Value   Lipase 55 (*)    All other components within normal limits  COMPREHENSIVE METABOLIC PANEL - Abnormal; Notable for the following components:   Glucose, Bld 140 (*)    Creatinine, Ser 1.20 (*)    GFR calc non Af Amer 39 (*)    GFR calc Af Amer 45 (*)    All other components within normal limits  CBC - Abnormal; Notable for the following components:   Hemoglobin 15.1 (*)    HCT 47.3 (*)    All other components within normal limits  URINALYSIS, ROUTINE W REFLEX MICROSCOPIC - Abnormal; Notable for the following components:   Color, Urine STRAW (*)    Glucose, UA 50 (*)    All other components within normal limits  PROTIME-INR - Abnormal; Notable for the following components:   Prothrombin Time 24.1 (*)    All other components within  normal limits  TROPONIN I    EKG EKG Interpretation  Date/Time:  Saturday November 24 2017 19:40:35 EDT Ventricular Rate:  68 PR Interval:    QRS Duration: 94 QT Interval:  429 QTC Calculation: 457 R Axis:   70 Text Interpretation:  Atrial fibrillation Low voltage, extremity leads Confirmed by Loren RacerYelverton, Katelynne Revak ([id number]) on 11/24/2017 11:42:09 PM   Radiology Ct Abdomen Pelvis W Contrast  Result Date: 11/24/2017 CLINICAL DATA:  Epigastric pain after dinner. EXAM: CT ABDOMEN AND PELVIS WITH CONTRAST TECHNIQUE: Multidetector CT imaging of the abdomen and pelvis was performed using the standard protocol following bolus administration of intravenous contrast. CONTRAST:  100mL OMNIPAQUE IOHEXOL 300 MG/ML  SOLN COMPARISON:  CT 06/08/2014, CXR 04/02/2015 FINDINGS: Lower chest: Redemonstration of large hiatal hernia containing much of the stomach. Heart size is borderline enlarged but stable. There is atelectasis at each base. Hepatobiliary: Hepatic steatosis. Cholecystectomy. No space-occupying mass of the liver. Biliary dilatation compatible with reservoir effect status post cholecystectomy. No choledocholithiasis. Pancreas: Pancreatic atrophy. Ectasia of the pancreatic duct without pancreatic mass or inflammation. Spleen: Normal Adrenals/Urinary Tract: Normal bilateral adrenal glands. Symmetric cortical enhancement of both kidneys with mild cortical scarring along lateral aspect of both kidneys. No nephrolithiasis nor hydroureteronephrosis. The urinary bladder is unremarkable for the degree of distention. Stomach/Bowel: Normal small bowel rotation without obstruction. Moderate stool retention within the colon with scattered colonic diverticulosis. No acute diverticulitis. Appendix not well visualized but no pericecal inflammatory change noted. Vascular/Lymphatic: Mild mild aortoiliac and branch vessel atherosclerosis without aneurysm. No adenopathy. Reproductive: Calcified uterine fibroids.  No adnexal mass.  Other: No free air or free fluid. Musculoskeletal: No worrisome lytic or sclerotic lesions. Degenerative change noted of the dorsal spine SI joints and pubic symphysis. IMPRESSION: 1. Repeat demonstration of large hiatal hernia. No acute bowel obstruction or inflammation. Scattered colonic diverticulosis without acute diverticulitis. 2. Mild hepatic steatosis. Reservoir effect status post cholecystectomy accounting for intra and extrahepatic ductal dilatation. 3. Calcified uterine fibroids.  Adnexal masses Electronically Signed   By: Tollie Ethavid  Kwon M.D.   On: 11/24/2017 21:35    Procedures Procedures (including critical care time)  Medications Ordered in ED Medications  fentaNYL (SUBLIMAZE) injection 50 mcg (50 mcg Intravenous Given 11/24/17 2021)  sodium chloride 0.9 %  bolus 500 mL (0 mLs Intravenous Stopped 11/24/17 2122)  iohexol (OMNIPAQUE) 300 MG/ML solution 100 mL (100 mLs Intravenous Contrast Given 11/24/17 2100)  ondansetron (ZOFRAN) injection 4 mg (4 mg Intravenous Given 11/24/17 2137)  gi cocktail (Maalox,Lidocaine,Donnatal) (30 mLs Oral Given 11/24/17 2210)     Initial Impression / Assessment and Plan / ED Course  I have reviewed the triage vital signs and the nursing notes.  Pertinent labs & imaging results that were available during my care of the patient were reviewed by me and considered in my medical decision making (see chart for details).    Patient presents with upper abdominal pain while eating.  Low suspicion for CAD.  EKG without ischemic findings.  Normal troponin. Patient is feeling much better after medication including GI cocktail.  CT with evidence of large hiatal hernia.  Mild elevation of lipase but no inflammatory changes seen around the pancreas on CT.  Believe her symptoms are likely due to her hiatal hernia.  She is on antihistamine and PPI.  Will start on Carafate and have follow-up with gastroenterology.  Return precautions given.  Final Clinical Impressions(s) /  ED Diagnoses   Final diagnoses:  Pain of upper abdomen  Large hiatal hernia    ED Discharge Orders        Ordered    sucralfate (CARAFATE) 1 g tablet  3 times daily with meals & bedtime     11/24/17 2337       Loren Racer, MD 11/24/17 2342

## 2017-11-24 NOTE — ED Triage Notes (Signed)
Pt BIB [facility] from [facility] for abdominal pain and altered mental status. Pt was fine prior to dinner starting. After dinner patient c/o of abdominal pain. EMS stated pt looked pale and diaphoretic. Pt had a BM and it was described as loose. Hx of CVA

## 2017-11-24 NOTE — ED Notes (Signed)
Patients family at bedside. Updated them about patients status

## 2017-11-24 NOTE — ED Notes (Signed)
Called and added Trop I on in lab. Spoke with [employer]

## 2017-11-24 NOTE — ED Notes (Signed)
Informed again that we need a urine sample. Pt is on PureWick device, placement was checked prior to leaving room.

## 2017-11-25 ENCOUNTER — Emergency Department (HOSPITAL_COMMUNITY): Payer: Medicare Other

## 2017-11-25 ENCOUNTER — Inpatient Hospital Stay (HOSPITAL_COMMUNITY)
Admission: EM | Admit: 2017-11-25 | Discharge: 2017-11-29 | DRG: 392 | Disposition: A | Discharge status: Home / Self Care | Payer: Medicare Other | Attending: Internal Medicine | Admitting: Internal Medicine

## 2017-11-25 ENCOUNTER — Encounter (HOSPITAL_COMMUNITY): Payer: Self-pay

## 2017-11-25 DIAGNOSIS — Z7901 Long term (current) use of anticoagulants: Secondary | ICD-10-CM

## 2017-11-25 DIAGNOSIS — R112 Nausea with vomiting, unspecified: Secondary | ICD-10-CM | POA: Diagnosis present

## 2017-11-25 DIAGNOSIS — F419 Anxiety disorder, unspecified: Secondary | ICD-10-CM | POA: Diagnosis present

## 2017-11-25 DIAGNOSIS — E1122 Type 2 diabetes mellitus with diabetic chronic kidney disease: Secondary | ICD-10-CM | POA: Diagnosis present

## 2017-11-25 DIAGNOSIS — K589 Irritable bowel syndrome without diarrhea: Secondary | ICD-10-CM | POA: Diagnosis present

## 2017-11-25 DIAGNOSIS — Z9049 Acquired absence of other specified parts of digestive tract: Secondary | ICD-10-CM

## 2017-11-25 DIAGNOSIS — E782 Mixed hyperlipidemia: Secondary | ICD-10-CM | POA: Diagnosis present

## 2017-11-25 DIAGNOSIS — N182 Chronic kidney disease, stage 2 (mild): Secondary | ICD-10-CM | POA: Diagnosis present

## 2017-11-25 DIAGNOSIS — I4891 Unspecified atrial fibrillation: Secondary | ICD-10-CM | POA: Diagnosis present

## 2017-11-25 DIAGNOSIS — Z79899 Other long term (current) drug therapy: Secondary | ICD-10-CM

## 2017-11-25 DIAGNOSIS — E1140 Type 2 diabetes mellitus with diabetic neuropathy, unspecified: Secondary | ICD-10-CM | POA: Diagnosis present

## 2017-11-25 DIAGNOSIS — Z8261 Family history of arthritis: Secondary | ICD-10-CM

## 2017-11-25 DIAGNOSIS — K219 Gastro-esophageal reflux disease without esophagitis: Secondary | ICD-10-CM | POA: Diagnosis present

## 2017-11-25 DIAGNOSIS — Z823 Family history of stroke: Secondary | ICD-10-CM

## 2017-11-25 DIAGNOSIS — Z833 Family history of diabetes mellitus: Secondary | ICD-10-CM

## 2017-11-25 DIAGNOSIS — Z9841 Cataract extraction status, right eye: Secondary | ICD-10-CM

## 2017-11-25 DIAGNOSIS — K222 Esophageal obstruction: Principal | ICD-10-CM | POA: Diagnosis present

## 2017-11-25 DIAGNOSIS — Z8249 Family history of ischemic heart disease and other diseases of the circulatory system: Secondary | ICD-10-CM

## 2017-11-25 DIAGNOSIS — K449 Diaphragmatic hernia without obstruction or gangrene: Secondary | ICD-10-CM | POA: Diagnosis not present

## 2017-11-25 DIAGNOSIS — I10 Essential (primary) hypertension: Secondary | ICD-10-CM | POA: Diagnosis present

## 2017-11-25 DIAGNOSIS — Z66 Do not resuscitate: Secondary | ICD-10-CM | POA: Diagnosis present

## 2017-11-25 DIAGNOSIS — E785 Hyperlipidemia, unspecified: Secondary | ICD-10-CM | POA: Diagnosis present

## 2017-11-25 DIAGNOSIS — E876 Hypokalemia: Secondary | ICD-10-CM | POA: Diagnosis present

## 2017-11-25 DIAGNOSIS — Z888 Allergy status to other drugs, medicaments and biological substances status: Secondary | ICD-10-CM

## 2017-11-25 DIAGNOSIS — I129 Hypertensive chronic kidney disease with stage 1 through stage 4 chronic kidney disease, or unspecified chronic kidney disease: Secondary | ICD-10-CM | POA: Diagnosis present

## 2017-11-25 DIAGNOSIS — Z794 Long term (current) use of insulin: Secondary | ICD-10-CM

## 2017-11-25 DIAGNOSIS — E119 Type 2 diabetes mellitus without complications: Secondary | ICD-10-CM | POA: Diagnosis present

## 2017-11-25 DIAGNOSIS — Z8673 Personal history of transient ischemic attack (TIA), and cerebral infarction without residual deficits: Secondary | ICD-10-CM

## 2017-11-25 DIAGNOSIS — Z9842 Cataract extraction status, left eye: Secondary | ICD-10-CM

## 2017-11-25 DIAGNOSIS — E559 Vitamin D deficiency, unspecified: Secondary | ICD-10-CM | POA: Diagnosis present

## 2017-11-25 DIAGNOSIS — I700 Atherosclerosis of aorta: Secondary | ICD-10-CM | POA: Diagnosis present

## 2017-11-25 LAB — COMPREHENSIVE METABOLIC PANEL
ALBUMIN: 4 g/dL (ref 3.5–5.0)
ALK PHOS: 121 U/L (ref 38–126)
ALT: 17 U/L (ref 0–44)
AST: 23 U/L (ref 15–41)
Anion gap: 13 (ref 5–15)
BUN: 19 mg/dL (ref 8–23)
CHLORIDE: 101 mmol/L (ref 98–111)
CO2: 25 mmol/L (ref 22–32)
CREATININE: 0.9 mg/dL (ref 0.44–1.00)
Calcium: 9.6 mg/dL (ref 8.9–10.3)
GFR calc Af Amer: >60 mL/min (ref >60)
GFR calc non Af Amer: 55 mL/min — ABNORMAL LOW (ref >60)
Glucose, Bld: 203 mg/dL — ABNORMAL HIGH (ref 70–99)
Potassium: 3.7 mmol/L (ref 3.5–5.1)
SODIUM: 139 mmol/L (ref 135–145)
Total Bilirubin: 1.3 mg/dL — ABNORMAL HIGH (ref 0.3–1.2)
Total Protein: 7.7 g/dL (ref 6.5–8.1)

## 2017-11-25 LAB — CBC
HCT: 50.4 % — ABNORMAL HIGH (ref 36.0–46.0)
Hemoglobin: 16.6 g/dL — ABNORMAL HIGH (ref 12.0–15.0)
MCH: 31.1 pg (ref 26.0–34.0)
MCHC: 32.9 g/dL (ref 30.0–36.0)
MCV: 94.6 fL (ref 78.0–100.0)
PLATELETS: 205 10*3/uL (ref 150–400)
RBC: 5.33 MIL/uL — ABNORMAL HIGH (ref 3.87–5.11)
RDW: 12.9 % (ref 11.5–15.5)
WBC: 14 10*3/uL — ABNORMAL HIGH (ref 4.0–10.5)

## 2017-11-25 LAB — LIPASE, BLOOD: LIPASE: 36 U/L (ref 11–51)

## 2017-11-25 MED ORDER — SODIUM CHLORIDE 0.9 % IV BOLUS
500.0000 mL | Freq: Once | INTRAVENOUS | Status: AC
  Administered 2017-11-25: 500 mL via INTRAVENOUS

## 2017-11-25 MED ORDER — SODIUM CHLORIDE 0.9 % IV SOLN
Freq: Once | INTRAVENOUS | Status: DC
Start: 2017-11-25 — End: 2017-11-26

## 2017-11-25 MED ORDER — HYDRALAZINE HCL 20 MG/ML IJ SOLN
5.0000 mg | Freq: Once | INTRAMUSCULAR | Status: DC
  Filled 2017-11-25: qty 1

## 2017-11-25 MED ORDER — ONDANSETRON 4 MG PO TBDP
4.0000 mg | ORAL_TABLET | Freq: Once | ORAL | Status: AC | PRN
  Administered 2017-11-25: 4 mg via ORAL
  Filled 2017-11-25: qty 1

## 2017-11-25 NOTE — ED Notes (Signed)
EDP notified of BP, RN to hold hydralazine at this time. Will continue to monitor.

## 2017-11-25 NOTE — ED Notes (Signed)
Pts family  understood dc material. NAD noted. Script given to patients family members

## 2017-11-25 NOTE — ED Notes (Signed)
Patient transported to X-ray 

## 2017-11-25 NOTE — ED Provider Notes (Signed)
Medical screening examination/treatment/procedure(s) were conducted as a shared visit with non-physician practitioner(s) and myself.  I personally evaluated the patient during the encounter.  EKG Interpretation  Date/Time:  Sunday November 25 2017 17:10:56 EDT Ventricular Rate:  134 PR Interval:    QRS Duration: 113 QT Interval:  413 QTC Calculation: 506 R Axis:   71 Text Interpretation:  Atrial fibrillation Paired ventricular premature complexes Borderline intraventricular conduction delay Repolarization abnormality, prob rate related Minimal ST elevation, anterolateral leads Prolonged QT interval no sig change from previoius except baseline artifact. Confirmed by Arby BarrettePfeiffer, Warda Mcqueary [phone]) on 11/25/2017 5:16:04 PM  Patient has had ongoing and recurrent problems with severe nausea and inability to tolerate oral intake.  She was seen the day before presentation and CT scan obtained showing large hiatal hernia.  Patient is alert and appropriate.  She does not have respiratory distress.  Does appear uncomfortable.  Abdomen is soft without guarding but moderate epigastric discomfort.  Patient remains symptomatic after treatment in the emergency department yesterday.  May be due to large hiatal hernia that is very symptomatic.  I agree with plan and management.   Arby BarrettePfeiffer, Prisha Hiley, MD 11/29/17 1620

## 2017-11-25 NOTE — ED Triage Notes (Signed)
Pt presents for evaluation of nausea and vomiting. Pt was seen last night for same. Pt persistently vomiting throughout night and day.

## 2017-11-25 NOTE — ED Provider Notes (Signed)
[facility] Provider Note   CSN: [id number] Arrival date & time: 11/25/17  1412     History   Chief Complaint Chief Complaint  Patient presents with  . Emesis    HPI Lisa Hopkins is a 84 y.o. female.  The history is provided by the patient and a relative. No language interpreter was used.  Emesis   This is a recurrent problem. The current episode started yesterday. The problem occurs continuously. The problem has been gradually worsening. There has been no fever. Pertinent negatives include no fever and no sweats.   Pt complains of vomiting and nausea.  Pt was seen here yesterday for the same.  Pt reports abdominal pain and not being able to eat or drink anything.  Pt reports she vomits every time she takes a sip of water.   Past Medical History:  Diagnosis Date  . Anxiety   . Aphasia as late effect of cerebrovascular accident   . Atrial fibrillation (HCC)   . CVA (cerebral infarction)   . Diabetes mellitus type II   . GERD (gastroesophageal reflux disease)   . Hiatal hernia   . HTN (hypertension)   . Hyperlipidemia   . IBS (irritable bowel syndrome)   . Stroke (HCC)   . Vitamin D deficiency     Patient Active Problem List   Diagnosis Date Noted  . CKD stage 2 due to type 2 diabetes mellitus (HCC) 08/02/2017  . Type 2 diabetes mellitus with diabetic neuropathy, without long-term current use of insulin (HCC) 12/25/2016  . Atherosclerosis of aorta (HCC) 12/03/2015  . GERD 09/25/2014  . Diastolic CHF (HCC) 08/04/2014  . Bursitis of hip 03/03/2014  . Medication management 07/01/2013  . Hyperlipidemia, mixed   . Vitamin D deficiency   . T2_NIDDM w/ CKD2 (GFR 64 ml/min) 03/15/2010  . Anxiety state 03/15/2010  . Essential hypertension 03/15/2010  . Atrial fibrillation (HCC) 03/15/2010  . APHASIA DUE TO CEREBROVASCULAR DISEASE 03/15/2010  . History of CVA (cerebrovascular accident) 03/07/2010    Past Surgical History:    Procedure Laterality Date  . CATARACT EXTRACTION, BILATERAL    . CHOLECYSTECTOMY    . COLONOSCOPY  2005  . TONSILLECTOMY    . UPPER GASTROINTESTINAL ENDOSCOPY  2005     OB History   None      Home Medications    Prior to Admission medications   Medication Sig Start Date End Date Taking? Authorizing Provider  acetaminophen (TYLENOL) 325 MG tablet Take 325-650 mg by mouth every 4 (four) hours as needed for mild pain, moderate pain or headache.     [provider]  Cholecalciferol (VITAMIN D) 2000 UNITS CAPS Take 2,000 Units by mouth daily.     [provider]  dicyclomine (BENTYL) 20 MG tablet Take 1 tablet 3 x day if needed for nausea, bloating , cramping or diarrhea 06/24/15   Lucky CowboyMcKeown, William, MD  furosemide (LASIX) 20 MG tablet Take 1 tablet daily for BP & fluid retention 05/22/17 11/20/17  Lucky CowboyMcKeown, William, MD  losartan (COZAAR) 50 MG tablet TAKE 1 TABLET EVERY MORNING, CHECK BP AT LUNCH-IF BP ABOVE 140/90 TAKE ANOTHER TABLET. 11/14/17   Judd Gaudierorbett, Ashley, NP  Magnesium Hydroxide (MAGNESIA PO) Take 1 tablet by mouth daily.     [provider]  metoprolol tartrate (LOPRESSOR) 25 MG tablet Take 0.5 tablets (12.5 mg total) by mouth 2 (two) times daily. 07/16/17 10/14/17  Lucky CowboyMcKeown, William, MD  pantoprazole (PROTONIX) 40 MG tablet TAKE 1  TABLET ONCE DAILY FOR REFLUX. 08/27/17   Judd Gaudier, NP  potassium chloride (K-DUR) 10 MEQ tablet Take 1 tablet daily 04/15/17 11/13/17  Lucky Cowboy, MD  potassium chloride (K-DUR) 10 MEQ tablet TAKE 1 TABLET BY MOUTH DAILY. 10/17/17   Lucky Cowboy, MD  QUEtiapine (SEROQUEL) 25 MG tablet Take 1 tablet at bedtime 10/03/17 09/24/18  Lucky Cowboy, MD  ranitidine (ZANTAC) 300 MG tablet Take 1 tablet (300 mg total) by mouth 2 (two) times daily. 07/27/17   Lucky Cowboy, MD  sucralfate (CARAFATE) 1 g tablet Take 1 tablet (1 g total) by mouth 4 (four) times daily -  with meals and at bedtime. 11/24/17   Loren Racer, MD   warfarin (COUMADIN) 2 MG tablet TAKE AS DIRECTED BY ANTICOAGULATION CLINIC. 09/10/17   Lars Masson, MD    Family History Family History  Problem Relation Age of Onset  . Diabetes Mother   . Stroke Mother   . Hypertension Brother   . Heart disease Brother   . Arthritis Sister        Rhematoid  . Arthritis Sister        Rhematoid    Social History Social History   Tobacco Use  . Smoking status: Never Smoker  . Smokeless tobacco: Never Used  Substance Use Topics  . Alcohol use: No  . Drug use: No     Allergies   Ace inhibitors; Fosamax [alendronate sodium]; Norvasc [amlodipine besylate]; and Zocor [simvastatin]   Review of Systems Review of Systems  Constitutional: Negative for fever.  Gastrointestinal: Positive for vomiting.  All other systems reviewed and are negative.    Physical Exam Updated Vital Signs BP (!) 182/105 (BP Location: Right Arm)   Pulse 94   Temp 97.8 F (36.6 C) (Oral)   Resp 18   SpO2 97%   Physical Exam  Constitutional: She is oriented to person, place, and time. She appears well-developed and well-nourished.  HENT:  Head: Normocephalic.  Mouth/Throat: Oropharynx is clear and moist.  Mouth dry  Eyes: Pupils are equal, round, and reactive to light. EOM are normal.  Neck: Normal range of motion.  Cardiovascular: Normal rate and regular rhythm.  Pulmonary/Chest: Effort normal.  Abdominal: Soft. She exhibits no distension. There is tenderness. There is no guarding.  Diffuse tenderness   Musculoskeletal: Normal range of motion.  Neurological: She is alert and oriented to person, place, and time.  Skin: Skin is warm.  Psychiatric: She has a normal mood and affect.  Nursing note and vitals reviewed.    ED Treatments / Results  Labs (all labs ordered are listed, but only abnormal results are displayed) Labs Reviewed  COMPREHENSIVE METABOLIC PANEL - Abnormal; Notable for the following components:      Result Value   Glucose,  Bld 203 (*)    Total Bilirubin 1.3 (*)    GFR calc non Af Amer 55 (*)    All other components within normal limits  CBC - Abnormal; Notable for the following components:   WBC 14.0 (*)    RBC 5.33 (*)    Hemoglobin 16.6 (*)    HCT 50.4 (*)    All other components within normal limits  LIPASE, BLOOD    EKG EKG Interpretation  Date/Time:  Sunday November 25 2017 17:10:56 EDT Ventricular Rate:  134 PR Interval:    QRS Duration: 113 QT Interval:  413 QTC Calculation: 506 R Axis:   71 Text Interpretation:  Atrial fibrillation Paired ventricular premature complexes Borderline intraventricular  conduction delay Repolarization abnormality, prob rate related Minimal ST elevation, anterolateral leads Prolonged QT interval no sig change from previoius except baseline artifact. Confirmed by Arby Barrette [phone]) on 11/25/2017 5:16:04 PM   Radiology Ct Abdomen Pelvis W Contrast  Result Date: 11/24/2017 CLINICAL DATA:  Epigastric pain after dinner. EXAM: CT ABDOMEN AND PELVIS WITH CONTRAST TECHNIQUE: Multidetector CT imaging of the abdomen and pelvis was performed using the standard protocol following bolus administration of intravenous contrast. CONTRAST:  OMNIPAQUE IOHEXOL 300 MG/ML  SOLN COMPARISON:  CT 06/08/2014, CXR 04/02/2015 FINDINGS: Lower chest: Redemonstration of large hiatal hernia containing much of the stomach. Heart size is borderline enlarged but stable. There is atelectasis at each base. Hepatobiliary: Hepatic steatosis. Cholecystectomy. No space-occupying mass of the liver. Biliary dilatation compatible with reservoir effect status post cholecystectomy. No choledocholithiasis. Pancreas: Pancreatic atrophy. Ectasia of the pancreatic duct without pancreatic mass or inflammation. Spleen: Normal Adrenals/Urinary Tract: Normal bilateral adrenal glands. Symmetric cortical enhancement of both kidneys with mild cortical scarring along lateral aspect of both kidneys. No nephrolithiasis nor  hydroureteronephrosis. The urinary bladder is unremarkable for the degree of distention. Stomach/Bowel: Normal small bowel rotation without obstruction. Moderate stool retention within the colon with scattered colonic diverticulosis. No acute diverticulitis. Appendix not well visualized but no pericecal inflammatory change noted. Vascular/Lymphatic: Mild mild aortoiliac and branch vessel atherosclerosis without aneurysm. No adenopathy. Reproductive: Calcified uterine fibroids.  No adnexal mass. Other: No free air or free fluid. Musculoskeletal: No worrisome lytic or sclerotic lesions. Degenerative change noted of the dorsal spine SI joints and pubic symphysis. IMPRESSION: 1. Repeat demonstration of large hiatal hernia. No acute bowel obstruction or inflammation. Scattered colonic diverticulosis without acute diverticulitis. 2. Mild hepatic steatosis. Reservoir effect status post cholecystectomy accounting for intra and extrahepatic ductal dilatation. 3. Calcified uterine fibroids.  Adnexal masses Electronically Signed   By: Tollie Eth M.D.   On: 11/24/2017 21:35    Procedures Procedures (including critical care time)  Medications Ordered in ED Medications  sodium chloride 0.9 % bolus 500 mL (500 mLs Intravenous New Bag/Given 11/25/17 1642)  ondansetron (ZOFRAN-ODT) disintegrating tablet 4 mg (4 mg Oral Given 11/25/17 1449)     Initial Impression / Assessment and Plan / ED Course  I have reviewed the triage vital signs and the nursing notes.  Pertinent labs & imaging results that were available during my care of the patient were reviewed by me and considered in my medical decision making (see chart for details).  Clinical Course as of Nov 25 2220  Sun Nov 25, 2017  2220 Comprehensive metabolic panel(!) [LS]    Clinical Course User Index [LS] Elson Areas, PA-C    MDM  Pt given zofran.  Pt reports continued nausea.  Pt had ct scan yesterday.   I reviewed ct scan.   Acute abdominal series  shows hiatal hernia.   Family reports pt is getting weaker.  Pt did not sleep last pm for vomiting.    I consulted hospitalist who will see for admission   Final Clinical Impressions(s) / ED Diagnoses   Final diagnoses:  Intractable vomiting with nausea, unspecified vomiting type    ED Discharge Orders    None        Osie Cheeks 11/25/17 2259    Arby Barrette, MD 12/04/17 1302

## 2017-11-26 ENCOUNTER — Encounter (HOSPITAL_COMMUNITY): Payer: Self-pay | Admitting: Internal Medicine

## 2017-11-26 DIAGNOSIS — R112 Nausea with vomiting, unspecified: Secondary | ICD-10-CM | POA: Diagnosis not present

## 2017-11-26 LAB — BASIC METABOLIC PANEL
ANION GAP: 12 (ref 5–15)
BUN: 21 mg/dL (ref 8–23)
CO2: 24 mmol/L (ref 22–32)
Calcium: 8.9 mg/dL (ref 8.9–10.3)
Chloride: 103 mmol/L (ref 98–111)
Creatinine, Ser: 0.89 mg/dL (ref 0.44–1.00)
GFR, EST NON AFRICAN AMERICAN: 55 mL/min — AB (ref >60)
GLUCOSE: 119 mg/dL — AB (ref 70–99)
POTASSIUM: 3.8 mmol/L (ref 3.5–5.1)
Sodium: 139 mmol/L (ref 135–145)

## 2017-11-26 LAB — HEPATIC FUNCTION PANEL
ALBUMIN: 3.1 g/dL — AB (ref 3.5–5.0)
ALK PHOS: 81 U/L (ref 38–126)
ALT: 14 U/L (ref 0–44)
AST: 21 U/L (ref 15–41)
BILIRUBIN TOTAL: 1 mg/dL (ref 0.3–1.2)
Bilirubin, Direct: 0.2 mg/dL (ref 0.0–0.2)
Indirect Bilirubin: 0.8 mg/dL (ref 0.3–0.9)
Total Protein: 5.8 g/dL — ABNORMAL LOW (ref 6.5–8.1)

## 2017-11-26 LAB — CBC
HEMATOCRIT: 42.4 % (ref 36.0–46.0)
HEMOGLOBIN: 13.8 g/dL (ref 12.0–15.0)
MCH: 30.9 pg (ref 26.0–34.0)
MCHC: 32.5 g/dL (ref 30.0–36.0)
MCV: 95.1 fL (ref 78.0–100.0)
Platelets: 194 10*3/uL (ref 150–400)
RBC: 4.46 MIL/uL (ref 3.87–5.11)
RDW: 12.9 % (ref 11.5–15.5)
WBC: 11.8 10*3/uL — ABNORMAL HIGH (ref 4.0–10.5)

## 2017-11-26 LAB — GLUCOSE, CAPILLARY
GLUCOSE-CAPILLARY: 104 mg/dL — AB (ref 70–99)
GLUCOSE-CAPILLARY: 116 mg/dL — AB (ref 70–99)
GLUCOSE-CAPILLARY: 142 mg/dL — AB (ref 70–99)
Glucose-Capillary: 159 mg/dL — ABNORMAL HIGH (ref 70–99)

## 2017-11-26 LAB — TROPONIN I
Troponin I: <0.03 ng/mL (ref <0.03)
Troponin I: <0.03 ng/mL (ref <0.03)

## 2017-11-26 LAB — PROTIME-INR
INR: 2.4
PROTHROMBIN TIME: 26 s — AB (ref 11.4–15.2)

## 2017-11-26 LAB — LIPASE, BLOOD: LIPASE: 37 U/L (ref 11–51)

## 2017-11-26 MED ORDER — SODIUM CHLORIDE 0.9 % IV SOLN
INTRAVENOUS | Status: DC
  Administered 2017-11-26 (×2): via INTRAVENOUS

## 2017-11-26 MED ORDER — FAMOTIDINE IN NACL 20-0.9 MG/50ML-% IV SOLN
20.0000 mg | Freq: Two times a day (BID) | INTRAVENOUS | Status: DC
  Administered 2017-11-26 – 2017-11-29 (×6): 20 mg via INTRAVENOUS
  Filled 2017-11-26 (×6): qty 50

## 2017-11-26 MED ORDER — ONDANSETRON HCL 4 MG PO TABS
4.0000 mg | ORAL_TABLET | Freq: Four times a day (QID) | ORAL | Status: DC | PRN
  Administered 2017-11-26: 4 mg via ORAL
  Filled 2017-11-26: qty 1

## 2017-11-26 MED ORDER — WARFARIN SODIUM 2 MG PO TABS
2.0000 mg | ORAL_TABLET | Freq: Once | ORAL | Status: AC
  Filled 2017-11-26: qty 1

## 2017-11-26 MED ORDER — ACETAMINOPHEN 650 MG RE SUPP: 650.0000 mg | Freq: Four times a day (QID) | RECTAL | Status: DC | PRN

## 2017-11-26 MED ORDER — INSULIN ASPART 100 UNIT/ML ~~LOC~~ SOLN
0.0000 [IU] | Freq: Three times a day (TID) | SUBCUTANEOUS | Status: DC
  Administered 2017-11-27 (×2): 1 [IU] via SUBCUTANEOUS

## 2017-11-26 MED ORDER — ACETAMINOPHEN 325 MG PO TABS: 650.0000 mg | ORAL_TABLET | Freq: Four times a day (QID) | ORAL | Status: DC | PRN

## 2017-11-26 MED ORDER — WARFARIN - PHARMACIST DOSING INPATIENT
Freq: Every day | Status: DC
  Administered 2017-11-28: 18:00:00

## 2017-11-26 MED ORDER — METOPROLOL TARTRATE 12.5 MG HALF TABLET
12.5000 mg | ORAL_TABLET | Freq: Two times a day (BID) | ORAL | Status: DC
  Administered 2017-11-26 – 2017-11-29 (×5): 12.5 mg via ORAL
  Filled 2017-11-26 (×5): qty 1

## 2017-11-26 MED ORDER — QUETIAPINE FUMARATE 25 MG PO TABS
25.0000 mg | ORAL_TABLET | Freq: Every day | ORAL | Status: DC
  Administered 2017-11-26 – 2017-11-28 (×3): 25 mg via ORAL
  Filled 2017-11-26 (×3): qty 1

## 2017-11-26 MED ORDER — ONDANSETRON HCL 4 MG/2ML IJ SOLN
4.0000 mg | Freq: Four times a day (QID) | INTRAMUSCULAR | Status: DC | PRN
  Administered 2017-11-26: 4 mg via INTRAVENOUS
  Filled 2017-11-26: qty 2

## 2017-11-26 MED ORDER — HYDRALAZINE HCL 20 MG/ML IJ SOLN
5.0000 mg | Freq: Four times a day (QID) | INTRAMUSCULAR | Status: DC | PRN
  Administered 2017-11-26: 5 mg via INTRAVENOUS
  Filled 2017-11-26: qty 1

## 2017-11-26 MED ORDER — PANTOPRAZOLE SODIUM 40 MG PO TBEC
40.0000 mg | DELAYED_RELEASE_TABLET | Freq: Every day | ORAL | Status: DC
  Administered 2017-11-26: 40 mg via ORAL
  Filled 2017-11-26: qty 1

## 2017-11-26 MED ORDER — LOSARTAN POTASSIUM 50 MG PO TABS
50.0000 mg | ORAL_TABLET | Freq: Every day | ORAL | Status: DC
  Administered 2017-11-26 – 2017-11-29 (×3): 50 mg via ORAL
  Filled 2017-11-26 (×3): qty 1

## 2017-11-26 MED ORDER — HYDROMORPHONE HCL 1 MG/ML IJ SOLN
0.2500 mg | INTRAMUSCULAR | Status: DC | PRN
  Administered 2017-11-26 – 2017-11-28 (×3): 0.5 mg via INTRAVENOUS
  Filled 2017-11-26 (×3): qty 0.5

## 2017-11-26 MED ORDER — FAMOTIDINE 20 MG PO TABS
20.0000 mg | ORAL_TABLET | Freq: Two times a day (BID) | ORAL | Status: DC
  Administered 2017-11-26 (×2): 20 mg via ORAL
  Filled 2017-11-26 (×2): qty 1

## 2017-11-26 NOTE — Progress Notes (Signed)
Pt nauseated. Coumadin held at this time and night shift RN notified. Pt given prn Zofran. Pt unable to tolerate more than a few sips of dinner tray (clear liquids).

## 2017-11-26 NOTE — Progress Notes (Signed)
Patient a 84 y.o admitted for nausea and vomitting, arrived into the unit at 1215am. Accompanied by son.  Patient is alert and oriented x4 occassionally forgetful.  Patient was oriented into the room. Cardiac monitoring box connected to patient no acute distress,pt. Denied pain, n/v. Will continue to monitor.

## 2017-11-26 NOTE — Progress Notes (Signed)
[facility] Progress Note  Subjective: NO CHARGE. Pt was admitted after MN.    Vitals:   11/26/17 0027 11/26/17 0044 11/26/17 0553 11/26/17 1350  BP: (!) 150/88  (!) 140/58 (!) 188/108  Pulse: 76  66 70  Resp:   18 (!) 24  Temp: 98.1 F (36.7 C)  99.4 F (37.4 C) 98.2 F (36.8 C)  TempSrc: Oral  Oral Oral  SpO2: 96%  95% 93%  Weight:  54.1 kg (119 lb 4.3 oz)    Height:  5' 2.4" (1.585 m)      Inpatient medications: . hydrALAZINE  5 mg Intravenous Once  . insulin aspart  0-9 Units Subcutaneous TID WC  . losartan  50 mg Oral Daily  . metoprolol tartrate  12.5 mg Oral BID  . QUEtiapine  25 mg Oral QHS  . warfarin  2 mg Oral ONCE-1800  . Warfarin - Pharmacist Dosing Inpatient   Does not apply q1800   . sodium chloride 75 mL/hr at 11/26/17 1642  . famotidine (PEPCID) IV     acetaminophen **OR** acetaminophen, hydrALAZINE, HYDROmorphone (DILAUDID) injection, ondansetron **OR** ondansetron (ZOFRAN) IV  Exam: GEN: pt is in pain, moaning, Ox 3, not in distress, dry heaves Neck: No mass palpated no neck rigidity no JVD appreciated. Respiratory: No rhonchi or crepitations.  Cardiovascular: S1-S2 heard. Abdomen: mild diffuse tenderness, no rebound, no ascites or mass, no hsm Musculoskeletal: No edema. Neurologic: NF, Ox 3 Psychiatric: Appears normal per normal affect.     Brief Summary: Pati Lisa Hopkins is a 84 y.o. female with history of atrial fibrillation, embolic stroke, diabetes mellitus type 2, hypertension was brought to the ER after patient was having persistent nausea vomiting.  Patient symptoms started 2 days ago after patient had dinner at her living facility.  She had come to the ER same night and had CT abdomen and pelvis which was unremarkable and was discharged home after being treated symptomatically.  Patient also was given Carafate.  After reaching her facility patient son states that she had benign persistent vomiting unable to keep in anything and was  brought to the ER again yesterday.  Denied any chest pain but has been having bandlike pain around the epigastric area.  Had one episode of diarrhea during the first episode of vomiting.  No one is sick at the living facility.  Had not had any leftover food.  Had last food was signed patient brought by her son. ED Course: In the ER acute abdominal series was unremarkable CT abdomen pelvis one day prior did not show anything acute except for extra and hip intrahepatic biliary dilatation which as per the radiologist was reservoir effect from previous cholecystectomy.  Labs were largely unremarkable.  Patient admitted for further observation due to persistent vomiting.  Patient symptoms in the ER tonight improved with 1 dose of ondansetron.   Principal Problem:   Intractable vomiting with nausea Active Problems:   Essential hypertension   Atrial fibrillation (HCC)   History of CVA (cerebrovascular accident)   Type 2 diabetes mellitus with diabetic neuropathy, without long-term current use of insulin (HCC)   CKD stage 2 due to type 2 diabetes mellitus (HCC)   Hiatal hernia   Impression/Plan:  1) Intractable nausea vomiting - hx of very large hiatal hernia extending into the chest. Had episode yrs back like this w/ severe abd pain attributed to hiatal hernia according to family.  Not feeling much better today - add pain meds , low dose prn dilaudid  IV 0.25- 0.5 mg prn - change po PPI to IV pepcid 20 mg bid - CT abd no acute findings - may need to consult GI in am if not improving  2) Atrial fib - on Coumadin and metoprolol -  patient cannot tolerate Coumadin will keep patient on heparin or Lovenox.  3) History of embolic CVA on Coumadin.  See #2.  4) Hypertension - cont metoprolol and Cozaar. BP's up due to pain not controlled   5) History of diabetes mellitus type 2: diet controlled at home. BS' up here - continue SSI   DVT prophylaxis: Coumadin. Code Status: DNR. Family  Communication: Patient's son. Disposition Plan: Back to facility. Consults called: None. Admission status: Observation.   Vinson Moselle MD [facility] pgr [phone] 11/26/2017, 5:33 PM   Recent Labs  Lab 11/24/17 1950 11/25/17 1457 11/26/17 0631  NA 140 139 139  K 3.9 3.7 3.8  CL 102 101 103  CO2 26 25 24   GLUCOSE 140* 203* 119*  BUN 20 19 21   CREATININE 1.20* 0.90 0.89  CALCIUM 9.4 9.6 8.9   Recent Labs  Lab 11/24/17 1950 11/25/17 1457 11/26/17 0631  AST 23 23 21   ALT 15 17 14   ALKPHOS 104 121 81  BILITOT 1.0 1.3* 1.0  PROT 6.5 7.7 5.8*  ALBUMIN 3.7 4.0 3.1*   Recent Labs  Lab 11/24/17 1950 11/25/17 1457 11/26/17 0631  WBC 7.9 14.0* 11.8*  HGB 15.1* 16.6* 13.8  HCT 47.3* 50.4* 42.4  MCV 97.7 94.6 95.1  PLT 204 205 194   Iron/TIBC/Ferritin/ %Sat No results found for: IRON, TIBC, FERRITIN, IRONPCTSAT

## 2017-11-26 NOTE — Progress Notes (Addendum)
BP (!) 188/108 (BP Location: Right Arm)   Pulse 70   Temp 98.2 F (36.8 C) (Oral)   Resp (!) 24   Ht 5' 2.4" (1.585 m)   Wt 54.1 kg (119 lb 4.3 oz)   SpO2 93%   BMI 21.54 kg/m   Pt remains nauseated on clear liquids.  1552 - Pt continuing to have nausea and pain in epigastric area. MD Toniann FailKakrakandy paged.

## 2017-11-26 NOTE — H&P (Signed)
History and Physical    Lisa GalloFrances H Hopkins UJW:[id number] DOB: 1925-04-19 DOA: 11/25/2017  PCP: Lucky CowboyMcKeown, William, MD  Patient coming from: Independent living facility.  Chief Complaint: Nausea vomiting.  HPI: Lisa Hopkins is a 84 y.o. female with history of atrial fibrillation, embolic stroke, diabetes mellitus type 2, hypertension was brought to the ER after patient has been having persistent nausea vomiting.  Patient symptoms started 2 days ago after patient had dinner at her living facility.  She had come to the ER same night and had CT abdomen and pelvis which was unremarkable and was discharged home after being treated symptomatically.  Patient also was given Carafate.  After reaching her facility patient son states that she has benign persistent vomiting unable to keep in anything and was brought to the ER again yesterday.  Denies any chest pain but has been having bandlike pain around the epigastric area.  Had one episode of diarrhea during the first episode of vomiting.  No one is sick at the living facility.  Has not had any leftover food.  Had last food was signed patient brought by her son.  ED Course: In the ER acute abdominal series was unremarkable CT abdomen pelvis done a day ago did not show anything acute except for extra and hip intrahepatic biliary dilatation which as per the radiologist was reservoir effect from previous cholecystectomy.  Labs were largely unremarkable.  Patient admitted for further observation due to persistent vomiting.  Patient symptoms in the ER tonight improved with 1 dose of ondansetron.  Review of Systems: As per HPI, rest all negative.   Past Medical History:  Diagnosis Date  . Anxiety   . Aphasia as late effect of cerebrovascular accident   . Atrial fibrillation (HCC)   . CVA (cerebral infarction)   . Diabetes mellitus type II   . GERD (gastroesophageal reflux disease)   . Hiatal hernia   . HTN (hypertension)   . Hyperlipidemia   . IBS  (irritable bowel syndrome)   . Stroke (HCC)   . Vitamin D deficiency     Past Surgical History:  Procedure Laterality Date  . CATARACT EXTRACTION, BILATERAL    . CHOLECYSTECTOMY    . COLONOSCOPY  2005  . TONSILLECTOMY    . UPPER GASTROINTESTINAL ENDOSCOPY  2005     reports that she has never smoked. She has never used smokeless tobacco. She reports that she does not drink alcohol or use drugs.  Allergies  Allergen Reactions  . Ace Inhibitors     Cough  . Fosamax [Alendronate Sodium]     Heart burn  . Norvasc [Amlodipine Besylate]     edema  . Zocor [Simvastatin]     Elevated CPK    Family History  Problem Relation Age of Onset  . Diabetes Mother   . Stroke Mother   . Hypertension Brother   . Heart disease Brother   . Arthritis Sister        Rhematoid  . Arthritis Sister        Rhematoid    Prior to Admission medications   Medication Sig Start Date End Date Taking? Authorizing Provider  acetaminophen (TYLENOL) 325 MG tablet Take 325-650 mg by mouth every 4 (four) hours as needed for mild pain, moderate pain or headache.     [provider]  Cholecalciferol (VITAMIN D) 2000 UNITS CAPS Take 2,000 Units by mouth daily.     [provider]  dicyclomine (BENTYL) 20 MG tablet Take 1  tablet 3 x day if needed for nausea, bloating , cramping or diarrhea 06/24/15   Lucky Cowboy, MD  furosemide (LASIX) 20 MG tablet Take 1 tablet daily for BP & fluid retention 05/22/17 11/20/17  Lucky Cowboy, MD  losartan (COZAAR) 50 MG tablet TAKE 1 TABLET EVERY MORNING, CHECK BP AT LUNCH-IF BP ABOVE 140/90 TAKE ANOTHER TABLET. 11/14/17   Judd Gaudier, NP  Magnesium Hydroxide (MAGNESIA PO) Take 1 tablet by mouth daily.     [provider]  metoprolol tartrate (LOPRESSOR) 25 MG tablet Take 0.5 tablets (12.5 mg total) by mouth 2 (two) times daily. 07/16/17 10/14/17  Lucky Cowboy, MD  pantoprazole (PROTONIX) 40 MG tablet TAKE 1 TABLET ONCE DAILY FOR REFLUX.  08/27/17   Judd Gaudier, NP  potassium chloride (K-DUR) 10 MEQ tablet Take 1 tablet daily 04/15/17 11/13/17  Lucky Cowboy, MD  potassium chloride (K-DUR) 10 MEQ tablet TAKE 1 TABLET BY MOUTH DAILY. 10/17/17   Lucky Cowboy, MD  QUEtiapine (SEROQUEL) 25 MG tablet Take 1 tablet at bedtime 10/03/17 09/24/18  Lucky Cowboy, MD  ranitidine (ZANTAC) 300 MG tablet Take 1 tablet (300 mg total) by mouth 2 (two) times daily. 07/27/17   Lucky Cowboy, MD  sucralfate (CARAFATE) 1 g tablet Take 1 tablet (1 g total) by mouth 4 (four) times daily -  with meals and at bedtime. 11/24/17   Loren Racer, MD  warfarin (COUMADIN) 2 MG tablet TAKE AS DIRECTED BY ANTICOAGULATION CLINIC. 09/10/17   Lars Masson, MD    Physical Exam: Vitals:   11/25/17 2145 11/25/17 2245 11/25/17 2315 11/26/17 0027  BP: (!) 154/86 (!) 141/76 129/77 (!) 150/88  Pulse: 85 82 82 76  Resp: (!) 23 20 19    Temp:    98.1 F (36.7 C)  TempSrc:    Oral  SpO2: 95% 94% 93% 96%      Constitutional: Moderately built and nourished. Vitals:   11/25/17 2145 11/25/17 2245 11/25/17 2315 11/26/17 0027  BP: (!) 154/86 (!) 141/76 129/77 (!) 150/88  Pulse: 85 82 82 76  Resp: (!) 23 20 19    Temp:    98.1 F (36.7 C)  TempSrc:    Oral  SpO2: 95% 94% 93% 96%   Eyes: Anicteric no pallor. ENMT: No discharge from the ears eyes nose or mouth. Neck: No mass palpated no neck rigidity no JVD appreciated. Respiratory: No rhonchi or crepitations.  Cardiovascular: S1-S2 heard. Abdomen: Soft nontender bowel sounds present. Musculoskeletal: No edema. Skin: No rash. Neurologic: Alert awake oriented to time place and person.  Moves all extremity's. Psychiatric: Appears normal per normal affect.   Labs on Admission: I have personally reviewed following labs and imaging studies  CBC: Recent Labs  Lab 11/24/17 1950 11/25/17 1457  WBC 7.9 14.0*  HGB 15.1* 16.6*  HCT 47.3* 50.4*  MCV 97.7 94.6  PLT 204 205   Basic Metabolic  Panel: Recent Labs  Lab 11/24/17 1950 11/25/17 1457  NA 140 139  K 3.9 3.7  CL 102 101  CO2 26 25  GLUCOSE 140* 203*  BUN 20 19  CREATININE 1.20* 0.90  CALCIUM 9.4 9.6   GFR: Estimated Creatinine Clearance: 32.1 mL/min (by C-G formula based on SCr of 0.9 mg/dL). Liver Function Tests: Recent Labs  Lab 11/24/17 1950 11/25/17 1457  AST 23 23  ALT 15 17  ALKPHOS 104 121  BILITOT 1.0 1.3*  PROT 6.5 7.7  ALBUMIN 3.7 4.0   Recent Labs  Lab 11/24/17 1950 11/25/17 1457  LIPASE  55* 36   No results for input(s): AMMONIA in the last 168 hours. Coagulation Profile: Recent Labs  Lab 11/24/17 2041  INR 2.18   Cardiac Enzymes: Recent Labs  Lab 11/24/17 2041  TROPONINI <0.03   BNP (last 3 results) No results for input(s): PROBNP in the last 8760 hours. HbA1C: No results for input(s): HGBA1C in the last 72 hours. CBG: No results for input(s): GLUCAP in the last 168 hours. Lipid Profile: No results for input(s): CHOL, HDL, LDLCALC, TRIG, CHOLHDL, LDLDIRECT in the last 72 hours. Thyroid Function Tests: No results for input(s): TSH, T4TOTAL, FREET4, T3FREE, THYROIDAB in the last 72 hours. Anemia Panel: No results for input(s): VITAMINB12, FOLATE, FERRITIN, TIBC, IRON, RETICCTPCT in the last 72 hours. Urine analysis:    Component Value Date/Time   COLORURINE STRAW (A) 11/24/2017 2229   APPEARANCEUR CLEAR 11/24/2017 2229   LABSPEC 1.030 11/24/2017 2229   PHURINE 7.0 11/24/2017 2229   GLUCOSEU 50 (A) 11/24/2017 2229   HGBUR NEGATIVE 11/24/2017 2229   BILIRUBINUR NEGATIVE 11/24/2017 2229   KETONESUR NEGATIVE 11/24/2017 2229   PROTEINUR NEGATIVE 11/24/2017 2229   UROBILINOGEN 1.0 04/02/2015 1244   NITRITE NEGATIVE 11/24/2017 2229   LEUKOCYTESUR NEGATIVE 11/24/2017 2229   Sepsis Labs: @LABRCNTIP (procalcitonin:4,lacticidven:4) )No results found for this or any previous visit (from the past 240 hour(s)).   Radiological Exams on Admission: Ct Abdomen Pelvis W  Contrast  Result Date: 11/24/2017 CLINICAL DATA:  Epigastric pain after dinner. EXAM: CT ABDOMEN AND PELVIS WITH CONTRAST TECHNIQUE: Multidetector CT imaging of the abdomen and pelvis was performed using the standard protocol following bolus administration of intravenous contrast. CONTRAST:  OMNIPAQUE IOHEXOL 300 MG/ML  SOLN COMPARISON:  CT 06/08/2014, CXR 04/02/2015 FINDINGS: Lower chest: Redemonstration of large hiatal hernia containing much of the stomach. Heart size is borderline enlarged but stable. There is atelectasis at each base. Hepatobiliary: Hepatic steatosis. Cholecystectomy. No space-occupying mass of the liver. Biliary dilatation compatible with reservoir effect status post cholecystectomy. No choledocholithiasis. Pancreas: Pancreatic atrophy. Ectasia of the pancreatic duct without pancreatic mass or inflammation. Spleen: Normal Adrenals/Urinary Tract: Normal bilateral adrenal glands. Symmetric cortical enhancement of both kidneys with mild cortical scarring along lateral aspect of both kidneys. No nephrolithiasis nor hydroureteronephrosis. The urinary bladder is unremarkable for the degree of distention. Stomach/Bowel: Normal small bowel rotation without obstruction. Moderate stool retention within the colon with scattered colonic diverticulosis. No acute diverticulitis. Appendix not well visualized but no pericecal inflammatory change noted. Vascular/Lymphatic: Mild mild aortoiliac and branch vessel atherosclerosis without aneurysm. No adenopathy. Reproductive: Calcified uterine fibroids.  No adnexal mass. Other: No free air or free fluid. Musculoskeletal: No worrisome lytic or sclerotic lesions. Degenerative change noted of the dorsal spine SI joints and pubic symphysis. IMPRESSION: 1. Repeat demonstration of large hiatal hernia. No acute bowel obstruction or inflammation. Scattered colonic diverticulosis without acute diverticulitis. 2. Mild hepatic steatosis. Reservoir effect status post  cholecystectomy accounting for intra and extrahepatic ductal dilatation. 3. Calcified uterine fibroids.  Adnexal masses Electronically Signed   By: Tollie Eth M.D.   On: 11/24/2017 21:35   Dg Abd Acute W/chest  Result Date: 11/25/2017 CLINICAL DATA:  84 y/o  F; upper abdominal pain, nausea, vomiting. EXAM: DG ABDOMEN ACUTE W/ 1V CHEST COMPARISON:  11/24/2017 CT of abdomen and pelvis. FINDINGS: Large hiatal hernia obscuring the left lung base. Visualized lung fields are clear. Aortic atherosclerosis. Cardiac silhouette obscured by left lung base hiatal hernia. Right upper quadrant surgical clips. Normal bowel gas pattern. Multilevel degenerative changes  of the spine, no acute osseous abnormality is evident. Calcified uterine myoma. IMPRESSION: Large hiatal hernia obscuring left lung base. No acute pulmonary process identified. Normal abdominal and pelvic bowel gas pattern. Electronically Signed   By: Mitzi Hansen M.D.   On: 11/25/2017 20:48    EKG: Independently reviewed.  A. fib rate controlled.  Assessment/Plan Principal Problem:   Intractable vomiting with nausea Active Problems:   Essential hypertension   Atrial fibrillation (HCC)   History of CVA (cerebrovascular accident)   Type 2 diabetes mellitus with diabetic neuropathy, without long-term current use of insulin (HCC)   CKD stage 2 due to type 2 diabetes mellitus (HCC)   Nausea & vomiting    1. Intractable nausea vomiting -cause not clear likely could be gastroenteritis.  We will recheck LFTs and lipase in a.m.  CT abdomen and pelvis done yesterday was unremarkable.  I have started patient on liquid diet if tolerated can advance diet.  We will also check cardiac markers and the patient has bandlike pain across the epigastrium. 2. A. fib on Coumadin and metoprolol.  If patient cannot tolerate Coumadin will keep patient on heparin or Lovenox. 3. History of embolic CVA on Coumadin.  See #2. 4. Hypertension on metoprolol and  Cozaar. 5. History of diabetes mellitus type 2 on diet presently.  Last hemoglobin A1c 2 weeks ago was 6.2.   DVT prophylaxis: Coumadin. Code Status: DNR. Family Communication: Patient's son. Disposition Plan: Back to facility. Consults called: None. Admission status: Observation.   Eduard Clos MD [facility] Pager [phone].  If 7PM-7AM, please contact night-coverage [email or web address] Password [email or web address]  11/26/2017, 12:33 AM

## 2017-11-26 NOTE — Progress Notes (Signed)
ANTICOAGULATION CONSULT NOTE - Initial Consult  Pharmacy Consult for coumadin Indication: atrial fibrillation  Allergies  Allergen Reactions  . Ace Inhibitors     Cough  . Fosamax [Alendronate Sodium]     Heart burn  . Norvasc [Amlodipine Besylate]     edema  . Zocor [Simvastatin]     Elevated CPK    Patient Measurements: Height: 5' 2.4" (158.5 cm) Weight: 119 lb 4.3 oz (54.1 kg) IBW/kg (Calculated) : 51.02  Vital Signs: Temp: 99.4 F (37.4 C) (07/01 0553) Temp Source: Oral (07/01 0553) BP: 140/58 (07/01 0553) Pulse Rate: 66 (07/01 0553)  Labs: Recent Labs    11/24/17 1950 11/24/17 2041 11/25/17 1457 11/26/17 0052 11/26/17 0631  HGB 15.1*  --  16.6*  --  13.8  HCT 47.3*  --  50.4*  --  42.4  PLT 204  --  205  --  194  LABPROT  --  24.1*  --   --  26.0*  INR  --  2.18  --   --  2.40  CREATININE 1.20*  --  0.90  --  0.89  TROPONINI  --  <0.03  --  <0.03 <0.03    Estimated Creatinine Clearance: 33.8 mL/min (by C-G formula based on SCr of 0.89 mg/dL).   Medical History: Past Medical History:  Diagnosis Date  . Anxiety   . Aphasia as late effect of cerebrovascular accident   . Atrial fibrillation (HCC)   . CVA (cerebral infarction)   . Diabetes mellitus type II   . GERD (gastroesophageal reflux disease)   . Hiatal hernia   . HTN (hypertension)   . Hyperlipidemia   . IBS (irritable bowel syndrome)   . Stroke (HCC)   . Vitamin D deficiency     Medications:  Medications Prior to Admission  Medication Sig Dispense Refill Last Dose  . acetaminophen (TYLENOL) 325 MG tablet Take 325-650 mg by mouth every 4 (four) hours as needed for mild pain, moderate pain or headache.    unknown at prn  . Cholecalciferol (VITAMIN D) 2000 UNITS CAPS Take 2,000 Units by mouth daily.    11/25/2017 at Unknown time  . dicyclomine (BENTYL) 20 MG tablet Take 1 tablet 3 x day if needed for nausea, bloating , cramping or diarrhea 90 tablet 1 unknown at prn  . furosemide (LASIX) 20  MG tablet Take 1 tablet daily for BP & fluid retention (Patient taking differently: Take 20 mg by mouth daily. Take 1 tablet daily for BP & fluid retention) 90 tablet 1 11/25/2017 at Unknown time  . losartan (COZAAR) 50 MG tablet TAKE 1 TABLET EVERY MORNING, CHECK BP AT LUNCH-IF BP ABOVE 140/90 TAKE ANOTHER TABLET. 60 tablet 0 11/25/2017 at Unknown time  . Magnesium Hydroxide (MAGNESIA PO) Take 1 tablet by mouth daily.    11/25/2017 at Unknown time  . metoprolol tartrate (LOPRESSOR) 25 MG tablet Take 0.5 tablets (12.5 mg total) by mouth 2 (two) times daily. 180 tablet 3 11/25/2017 at 0900  . pantoprazole (PROTONIX) 40 MG tablet TAKE 1 TABLET ONCE DAILY FOR REFLUX. 90 tablet 0 11/25/2017 at Unknown time  . potassium chloride (K-DUR) 10 MEQ tablet TAKE 1 TABLET BY MOUTH DAILY. 90 tablet 1 11/25/2017 at Unknown time  . QUEtiapine (SEROQUEL) 25 MG tablet Take 1 tablet at bedtime 90 tablet 1 11/24/2017  . ranitidine (ZANTAC) 300 MG tablet Take 1 tablet (300 mg total) by mouth 2 (two) times daily. 180 tablet 1 11/25/2017 at Unknown time  . warfarin (  COUMADIN) 2 MG tablet TAKE AS DIRECTED BY ANTICOAGULATION CLINIC. (Patient taking differently: take 2 mg tablet daily) 30 tablet 2 11/25/2017 at 0900    Assessment: 84 yo lady admitted with abdominal pain to resume coumadin for h/o afib.  INR 6/29 was 2.18.    INR today = 2.4  Goal of Therapy:  INR 2-3 Monitor platelets by anticoagulation protocol: Yes   Plan:  Warfarin 2 mg po x 1 tonight Daily PT/INR Monitor for bleeding complications  Thank you Okey RegalLisa Seretha Estabrooks, PharmD [phone]  11/26/2017,9:05 AM

## 2017-11-26 NOTE — Progress Notes (Signed)
ANTICOAGULATION CONSULT NOTE - Initial Consult  Pharmacy Consult for coumadin Indication: atrial fibrillation  Allergies  Allergen Reactions  . Ace Inhibitors     Cough  . Fosamax [Alendronate Sodium]     Heart burn  . Norvasc [Amlodipine Besylate]     edema  . Zocor [Simvastatin]     Elevated CPK    Patient Measurements:    Vital Signs: Temp: 98.1 F (36.7 C) (07/01 0027) Temp Source: Oral (07/01 0027) BP: 150/88 (07/01 0027) Pulse Rate: 76 (07/01 0027)  Labs: Recent Labs    11/24/17 1950 11/24/17 2041 11/25/17 1457  HGB 15.1*  --  16.6*  HCT 47.3*  --  50.4*  PLT 204  --  205  LABPROT  --  24.1*  --   INR  --  2.18  --   CREATININE 1.20*  --  0.90  TROPONINI  --  <0.03  --     Estimated Creatinine Clearance: 32.1 mL/min (by C-G formula based on SCr of 0.9 mg/dL).   Medical History: Past Medical History:  Diagnosis Date  . Anxiety   . Aphasia as late effect of cerebrovascular accident   . Atrial fibrillation (HCC)   . CVA (cerebral infarction)   . Diabetes mellitus type II   . GERD (gastroesophageal reflux disease)   . Hiatal hernia   . HTN (hypertension)   . Hyperlipidemia   . IBS (irritable bowel syndrome)   . Stroke (HCC)   . Vitamin D deficiency     Medications:  Medications Prior to Admission  Medication Sig Dispense Refill Last Dose  . acetaminophen (TYLENOL) 325 MG tablet Take 325-650 mg by mouth every 4 (four) hours as needed for mild pain, moderate pain or headache.    Taking  . Cholecalciferol (VITAMIN D) 2000 UNITS CAPS Take 2,000 Units by mouth daily.    Taking  . dicyclomine (BENTYL) 20 MG tablet Take 1 tablet 3 x day if needed for nausea, bloating , cramping or diarrhea 90 tablet 1 Taking  . furosemide (LASIX) 20 MG tablet Take 1 tablet daily for BP & fluid retention 90 tablet 1 Taking  . losartan (COZAAR) 50 MG tablet TAKE 1 TABLET EVERY MORNING, CHECK BP AT LUNCH-IF BP ABOVE 140/90 TAKE ANOTHER TABLET. 60 tablet 0   . Magnesium  Hydroxide (MAGNESIA PO) Take 1 tablet by mouth daily.    Taking  . metoprolol tartrate (LOPRESSOR) 25 MG tablet Take 0.5 tablets (12.5 mg total) by mouth 2 (two) times daily. 180 tablet 3 Taking  . pantoprazole (PROTONIX) 40 MG tablet TAKE 1 TABLET ONCE DAILY FOR REFLUX. 90 tablet 0 Taking  . potassium chloride (K-DUR) 10 MEQ tablet Take 1 tablet daily 90 tablet 1 Taking  . potassium chloride (K-DUR) 10 MEQ tablet TAKE 1 TABLET BY MOUTH DAILY. 90 tablet 1 Taking  . QUEtiapine (SEROQUEL) 25 MG tablet Take 1 tablet at bedtime 90 tablet 1 Taking  . ranitidine (ZANTAC) 300 MG tablet Take 1 tablet (300 mg total) by mouth 2 (two) times daily. 180 tablet 1 Taking  . sucralfate (CARAFATE) 1 g tablet Take 1 tablet (1 g total) by mouth 4 (four) times daily -  with meals and at bedtime. 28 tablet 0   . warfarin (COUMADIN) 2 MG tablet TAKE AS DIRECTED BY ANTICOAGULATION CLINIC. 30 tablet 2 Taking    Assessment: 84 yo lady admitted with abdominal pain to resume coumadin for h/o afib.  INR 6/29 was 2.18.   Goal of Therapy:  INR  2-3 Monitor platelets by anticoagulation protocol: Yes   Plan:  Daily PT/INR Monitor for bleeding complications  Milcah Dulany Poteet 11/26/2017,12:39 AM

## 2017-11-26 NOTE — Care Management Note (Addendum)
Case Management Note  Patient Details  Name: Lisa Hopkins MRN: [id number] Date of Birth: November 16, 1924  Subjective/Objective:   Presents with intractable nausea and vomiting. Resides @ [facility]. Pt owns cane and walker.  [name] ([address]) [name] (Son)    [phone]     PCP: [name]   Action/Plan: [facility]....NCM following for disposition needs. Son Rosanne Ashing("Jim") to provide transportation to home once discharges.  Expected Discharge Date:                  Expected Discharge Plan:  Home/Self Care  In-House Referral:     Discharge planning Services  CM Consult  Post Acute Care Choice:    Choice offered to:     DME Arranged:   N/A DME Agency:   N/A  HH Arranged:   N/A HH Agency:   N/A  Status of Service:  Completed, signed off  If discussed at Long Length of Stay Meetings, dates discussed:    Additional Comments:  Epifanio LeschesCole, Gid Schoffstall Hudson, RN 11/26/2017, 10:43 AM

## 2017-11-27 DIAGNOSIS — Z833 Family history of diabetes mellitus: Secondary | ICD-10-CM | POA: Diagnosis not present

## 2017-11-27 DIAGNOSIS — E1122 Type 2 diabetes mellitus with diabetic chronic kidney disease: Secondary | ICD-10-CM | POA: Diagnosis not present

## 2017-11-27 DIAGNOSIS — I361 Nonrheumatic tricuspid (valve) insufficiency: Secondary | ICD-10-CM | POA: Diagnosis not present

## 2017-11-27 DIAGNOSIS — I10 Essential (primary) hypertension: Secondary | ICD-10-CM | POA: Diagnosis not present

## 2017-11-27 DIAGNOSIS — N182 Chronic kidney disease, stage 2 (mild): Secondary | ICD-10-CM

## 2017-11-27 DIAGNOSIS — R109 Unspecified abdominal pain: Secondary | ICD-10-CM | POA: Diagnosis not present

## 2017-11-27 DIAGNOSIS — R1111 Vomiting without nausea: Secondary | ICD-10-CM | POA: Diagnosis not present

## 2017-11-27 DIAGNOSIS — Z9841 Cataract extraction status, right eye: Secondary | ICD-10-CM | POA: Diagnosis not present

## 2017-11-27 DIAGNOSIS — Z01810 Encounter for preprocedural cardiovascular examination: Secondary | ICD-10-CM | POA: Diagnosis not present

## 2017-11-27 DIAGNOSIS — I482 Chronic atrial fibrillation: Secondary | ICD-10-CM | POA: Diagnosis not present

## 2017-11-27 DIAGNOSIS — K449 Diaphragmatic hernia without obstruction or gangrene: Secondary | ICD-10-CM

## 2017-11-27 DIAGNOSIS — F419 Anxiety disorder, unspecified: Secondary | ICD-10-CM | POA: Diagnosis present

## 2017-11-27 DIAGNOSIS — K219 Gastro-esophageal reflux disease without esophagitis: Secondary | ICD-10-CM | POA: Diagnosis present

## 2017-11-27 DIAGNOSIS — Z8673 Personal history of transient ischemic attack (TIA), and cerebral infarction without residual deficits: Secondary | ICD-10-CM

## 2017-11-27 DIAGNOSIS — Z7901 Long term (current) use of anticoagulants: Secondary | ICD-10-CM | POA: Diagnosis not present

## 2017-11-27 DIAGNOSIS — E876 Hypokalemia: Secondary | ICD-10-CM | POA: Diagnosis present

## 2017-11-27 DIAGNOSIS — Z9842 Cataract extraction status, left eye: Secondary | ICD-10-CM | POA: Diagnosis not present

## 2017-11-27 DIAGNOSIS — E1140 Type 2 diabetes mellitus with diabetic neuropathy, unspecified: Secondary | ICD-10-CM

## 2017-11-27 DIAGNOSIS — Z888 Allergy status to other drugs, medicaments and biological substances status: Secondary | ICD-10-CM | POA: Diagnosis not present

## 2017-11-27 DIAGNOSIS — Z794 Long term (current) use of insulin: Secondary | ICD-10-CM | POA: Diagnosis not present

## 2017-11-27 DIAGNOSIS — R112 Nausea with vomiting, unspecified: Secondary | ICD-10-CM | POA: Diagnosis present

## 2017-11-27 DIAGNOSIS — Z8261 Family history of arthritis: Secondary | ICD-10-CM | POA: Diagnosis not present

## 2017-11-27 DIAGNOSIS — K589 Irritable bowel syndrome without diarrhea: Secondary | ICD-10-CM | POA: Diagnosis present

## 2017-11-27 DIAGNOSIS — I129 Hypertensive chronic kidney disease with stage 1 through stage 4 chronic kidney disease, or unspecified chronic kidney disease: Secondary | ICD-10-CM | POA: Diagnosis not present

## 2017-11-27 DIAGNOSIS — I4891 Unspecified atrial fibrillation: Secondary | ICD-10-CM | POA: Diagnosis present

## 2017-11-27 DIAGNOSIS — Z66 Do not resuscitate: Secondary | ICD-10-CM | POA: Diagnosis present

## 2017-11-27 DIAGNOSIS — R1013 Epigastric pain: Secondary | ICD-10-CM

## 2017-11-27 DIAGNOSIS — Z8249 Family history of ischemic heart disease and other diseases of the circulatory system: Secondary | ICD-10-CM | POA: Diagnosis not present

## 2017-11-27 DIAGNOSIS — E785 Hyperlipidemia, unspecified: Secondary | ICD-10-CM | POA: Diagnosis present

## 2017-11-27 DIAGNOSIS — Z9049 Acquired absence of other specified parts of digestive tract: Secondary | ICD-10-CM | POA: Diagnosis not present

## 2017-11-27 DIAGNOSIS — Z79899 Other long term (current) drug therapy: Secondary | ICD-10-CM | POA: Diagnosis not present

## 2017-11-27 DIAGNOSIS — Z823 Family history of stroke: Secondary | ICD-10-CM | POA: Diagnosis not present

## 2017-11-27 DIAGNOSIS — E559 Vitamin D deficiency, unspecified: Secondary | ICD-10-CM | POA: Diagnosis present

## 2017-11-27 DIAGNOSIS — K222 Esophageal obstruction: Secondary | ICD-10-CM | POA: Diagnosis not present

## 2017-11-27 LAB — CBC
HCT: 44 % (ref 36.0–46.0)
Hemoglobin: 14.4 g/dL (ref 12.0–15.0)
MCH: 31 pg (ref 26.0–34.0)
MCHC: 32.7 g/dL (ref 30.0–36.0)
MCV: 94.6 fL (ref 78.0–100.0)
PLATELETS: 209 10*3/uL (ref 150–400)
RBC: 4.65 MIL/uL (ref 3.87–5.11)
RDW: 12.9 % (ref 11.5–15.5)
WBC: 12.2 10*3/uL — ABNORMAL HIGH (ref 4.0–10.5)

## 2017-11-27 LAB — GLUCOSE, CAPILLARY
Glucose-Capillary: 128 mg/dL — ABNORMAL HIGH (ref 70–99)
Glucose-Capillary: 147 mg/dL — ABNORMAL HIGH (ref 70–99)
Glucose-Capillary: 118 mg/dL — ABNORMAL HIGH (ref 70–99)
Glucose-Capillary: 107 mg/dL — ABNORMAL HIGH (ref 70–99)

## 2017-11-27 LAB — PROTIME-INR
INR: 2.41
Prothrombin Time: 26.1 seconds — ABNORMAL HIGH (ref 11.4–15.2)

## 2017-11-27 MED ORDER — SODIUM CHLORIDE 0.45 % IV SOLN
INTRAVENOUS | Status: DC
  Administered 2017-11-27 – 2017-11-28 (×2): via INTRAVENOUS

## 2017-11-27 MED ORDER — WARFARIN SODIUM 2 MG PO TABS
2.0000 mg | ORAL_TABLET | Freq: Once | ORAL | Status: AC
  Administered 2017-11-27: 2 mg via ORAL
  Filled 2017-11-27: qty 1

## 2017-11-27 NOTE — Progress Notes (Signed)
Discussed with Dr Dwain SarnaWakefield from surgery and I will proceed with EGD tomorrow just to make sure there is not an obstruction in esophagus. She had a barium swallow a few months ago and a barium pill stuck in proximal esophagus. Son said she has been eating ok. Will do diagnostic EGD tomorrow.

## 2017-11-27 NOTE — Consult Note (Signed)
Reason for Consult:abd pain, peh Referring Physician: Dr Cheri Kearns is an 84 y.o. female.  HPI:  82 yof with afib, dm,htn who has n/v admitted. Complains of epigastric pain when eating.  She cannot tell me what year it is.  She told me when this happened she called a surgeon to see what to do when she was at home.  She told me the surgeon was Richrd Humbles.  She states she has had a stroke recently but I cannot find when it was.  She is having pain when eating now. She for some reason she had barium swallow in march that showed a focal short segment stricture at c6 that the barium tablet did not pass.  She has known PEH.  She appears to have had cholecystectomy with ruq incision. She had ct scan that shows large PEH with no obstruction noted.  Asked to see her by Dr Penelope Coop for evaluation.    Past Medical History:  Diagnosis Date  . Anxiety   . Aphasia as late effect of cerebrovascular accident   . Atrial fibrillation ([place])   . CVA (cerebral infarction)   . Diabetes mellitus type II   . GERD (gastroesophageal reflux disease)   . Hiatal hernia   . HTN (hypertension)   . Hyperlipidemia   . IBS (irritable bowel syndrome)   . Stroke ([place])   . Vitamin D deficiency     Past Surgical History:  Procedure Laterality Date  . CATARACT EXTRACTION, BILATERAL    . CHOLECYSTECTOMY    . COLONOSCOPY  2005  . TONSILLECTOMY    . UPPER GASTROINTESTINAL ENDOSCOPY  2005    Family History  Problem Relation Age of Onset  . Diabetes Mother   . Stroke Mother   . Hypertension Brother   . Heart disease Brother   . Arthritis Sister        Rhematoid  . Arthritis Sister        Rhematoid    Social History:  reports that she has never smoked. She has never used smokeless tobacco. She reports that she does not drink alcohol or use drugs.  Allergies:  Allergies  Allergen Reactions  . Ace Inhibitors     Cough  . Fosamax [Alendronate Sodium]     Heart burn  . Norvasc [Amlodipine  Besylate]     edema  . Zocor [Simvastatin]     Elevated CPK    Medications: I have reviewed the patient's current medications.  Results for orders placed or performed during the hospital encounter of 11/25/17 (from the past 48 hour(s))  Lipase, blood     Status: None   Collection Time: 11/25/17  2:57 PM  Result Value Ref Range   Lipase 36 11 - 51 U/L    Comment: Performed at [facility], [place] [address], [place], [place] [address]  Comprehensive metabolic panel     Status: Abnormal   Collection Time: 11/25/17  2:57 PM  Result Value Ref Range   Sodium 139 135 - 145 mmol/L   Potassium 3.7 3.5 - 5.1 mmol/L   Chloride 101 98 - 111 mmol/L    Comment: Please note change in reference range.   CO2 25 22 - 32 mmol/L   Glucose, Bld 203 (H) 70 - 99 mg/dL    Comment: Please note change in reference range.   BUN 19 8 - 23 mg/dL    Comment: Please note change in reference range.   Creatinine, Ser 0.90 0.44 -  1.00 mg/dL   Calcium 9.6 8.9 - 10.3 mg/dL   Total Protein 7.7 6.5 - 8.1 g/dL   Albumin 4.0 3.5 - 5.0 g/dL   AST 23 15 - 41 U/L   ALT 17 0 - 44 U/L    Comment: Please note change in reference range.   Alkaline Phosphatase 121 38 - 126 U/L   Total Bilirubin 1.3 (H) 0.3 - 1.2 mg/dL   GFR calc non Af Amer 55 (L) >60 mL/min   GFR calc Af Amer >60 >60 mL/min    Comment: (NOTE) The eGFR has been calculated using the CKD EPI equation. This calculation has not been validated in all clinical situations. eGFR's persistently <60 mL/min signify possible Chronic Kidney Disease.    Anion gap 13 5 - 15    Comment: Performed at [facility] [address], [place], [place] [address]  CBC     Status: Abnormal   Collection Time: 11/25/17  2:57 PM  Result Value Ref Range   WBC 14.0 (H) 4.0 - 10.5 K/uL   RBC 5.33 (H) 3.87 - 5.11 MIL/uL   Hemoglobin 16.6 (H) 12.0 - 15.0 g/dL   HCT 50.4 (H) 36.0 - 46.0 %   MCV 94.6 78.0 - 100.0 fL   MCH 31.1 26.0 - 34.0 pg   MCHC 32.9 30.0 - 36.0  g/dL   RDW 12.9 11.5 - 15.5 %   Platelets 205 150 - 400 K/uL    Comment: Performed at [facility], [place] [address], [place], [place] [address]  Troponin I     Status: None   Collection Time: 11/26/17 12:52 AM  Result Value Ref Range   Troponin I <0.03 <0.03 ng/mL    Comment: Performed at [facility] [address] [place], [place] [address]  Basic metabolic panel     Status: Abnormal   Collection Time: 11/26/17  6:31 AM  Result Value Ref Range   Sodium 139 135 - 145 mmol/L   Potassium 3.8 3.5 - 5.1 mmol/L   Chloride 103 98 - 111 mmol/L    Comment: Please note change in reference range.   CO2 24 22 - 32 mmol/L   Glucose, Bld 119 (H) 70 - 99 mg/dL    Comment: Please note change in reference range.   BUN 21 8 - 23 mg/dL    Comment: Please note change in reference range.   Creatinine, Ser 0.89 0.44 - 1.00 mg/dL   Calcium 8.9 8.9 - 10.3 mg/dL   GFR calc non Af Amer 55 (L) >60 mL/min   GFR calc Af Amer >60 >60 mL/min    Comment: (NOTE) The eGFR has been calculated using the CKD EPI equation. This calculation has not been validated in all clinical situations. eGFR's persistently <60 mL/min signify possible Chronic Kidney Disease.    Anion gap 12 5 - 15    Comment: Performed at [facility] [address] [place] [address]  CBC     Status: Abnormal   Collection Time: 11/26/17  6:31 AM  Result Value Ref Range   WBC 11.8 (H) 4.0 - 10.5 K/uL   RBC 4.46 3.87 - 5.11 MIL/uL   Hemoglobin 13.8 12.0 - 15.0 g/dL   HCT 42.4 36.0 - 46.0 %   MCV 95.1 78.0 - 100.0 fL   MCH 30.9 26.0 - 34.0 pg   MCHC 32.5 30.0 - 36.0 g/dL   RDW 12.9 11.5 - 15.5 %  Platelets 194 150 - 400 K/uL    Comment: Performed at [facility], [place] [address], [place], [place] [address]  Hepatic function panel     Status: Abnormal   Collection Time: 11/26/17  6:31 AM  Result Value Ref Range   Total Protein 5.8 (L) 6.5 - 8.1 g/dL   Albumin 3.1 (L) 3.5 - 5.0 g/dL   AST 21 15 - 41  U/L   ALT 14 0 - 44 U/L    Comment: Please note change in reference range.   Alkaline Phosphatase 81 38 - 126 U/L   Total Bilirubin 1.0 0.3 - 1.2 mg/dL   Bilirubin, Direct 0.2 0.0 - 0.2 mg/dL    Comment: Please note change in reference range.   Indirect Bilirubin 0.8 0.3 - 0.9 mg/dL    Comment: Performed at [facility], [place] [address] [place], [place] [address]  Troponin I     Status: None   Collection Time: 11/26/17  6:31 AM  Result Value Ref Range   Troponin I <0.03 <0.03 ng/mL    Comment: Performed at [facility] [address], [place], [place] [address]  Lipase, blood     Status: None   Collection Time: 11/26/17  6:31 AM  Result Value Ref Range   Lipase 37 11 - 51 U/L    Comment: Performed at [facility] [address], [place],  [address]  Protime-INR     Status: Abnormal   Collection Time: 11/26/17  6:31 AM  Result Value Ref Range   Prothrombin Time 26.0 (H) 11.4 - 15.2 seconds   INR 2.40     Comment: Performed at [facility] [address], [place], [place] [address]  Glucose, capillary     Status: Abnormal   Collection Time: 11/26/17  8:07 AM  Result Value Ref Range   Glucose-Capillary 104 (H) 70 - 99 mg/dL  Glucose, capillary     Status: Abnormal   Collection Time: 11/26/17 11:58 AM  Result Value Ref Range   Glucose-Capillary 116 (H) 70 - 99 mg/dL  Troponin I     Status: None   Collection Time: 11/26/17 12:30 PM  Result Value Ref Range   Troponin I <0.03 <0.03 ng/mL    Comment: Performed at [facility], [place] [address] [place], [place] [address]  Glucose, capillary     Status: Abnormal   Collection Time: 11/26/17  5:43 PM  Result Value Ref Range   Glucose-Capillary 142 (H) 70 - 99 mg/dL  Glucose, capillary     Status: Abnormal   Collection Time: 11/26/17  8:46 PM  Result Value Ref Range   Glucose-Capillary 159 (H) 70 - 99 mg/dL  Protime-INR     Status: Abnormal   Collection Time: 11/27/17  4:31 AM  Result Value  Ref Range   Prothrombin Time 26.1 (H) 11.4 - 15.2 seconds   INR 2.41     Comment: Performed at [facility], [place] [address], [place] [address]  CBC     Status: Abnormal   Collection Time: 11/27/17  4:31 AM  Result Value Ref Range   WBC 12.2 (H) 4.0 - 10.5 K/uL   RBC 4.65 3.87 - 5.11 MIL/uL   Hemoglobin 14.4 12.0 - 15.0 g/dL   HCT 44.0 36.0 - 46.0 %   MCV 94.6 78.0 - 100.0 fL   MCH 31.0 26.0 - 34.0 pg   MCHC 32.7 30.0 - 36.0  g/dL   RDW 12.9 11.5 - 15.5 %   Platelets 209 150 - 400 K/uL    Comment: Performed at [facility], [place] [address], [place], [place] [address]  Glucose, capillary     Status: Abnormal   Collection Time: 11/27/17  7:31 AM  Result Value Ref Range   Glucose-Capillary 128 (H) 70 - 99 mg/dL  Glucose, capillary     Status: Abnormal   Collection Time: 11/27/17 12:05 PM  Result Value Ref Range   Glucose-Capillary 147 (H) 70 - 99 mg/dL    Dg Abd Acute W/chest  Result Date: 11/25/2017 CLINICAL DATA:  84 y/o  F; upper abdominal pain, nausea, vomiting. EXAM: DG ABDOMEN ACUTE W/ 1V CHEST COMPARISON:  11/24/2017 CT of abdomen and pelvis. FINDINGS: Large hiatal hernia obscuring the left lung base. Visualized lung fields are clear. Aortic atherosclerosis. Cardiac silhouette obscured by left lung base hiatal hernia. Right upper quadrant surgical clips. Normal bowel gas pattern. Multilevel degenerative changes of the spine, no acute osseous abnormality is evident. Calcified uterine myoma. IMPRESSION: Large hiatal hernia obscuring left lung base. No acute pulmonary process identified. Normal abdominal and pelvic bowel gas pattern. Electronically Signed   By: Kristine Garbe M.D.   On: 11/25/2017 20:48    Review of Systems  Gastrointestinal: Positive for abdominal pain, nausea and vomiting.   Blood pressure 129/87, pulse 70, temperature 97.7 F (36.5 C), temperature source Oral, resp. rate 20, height 5' 2.4" (1.585 m), weight 54.1 kg (119 lb 4.3 oz),  SpO2 94 %. Physical Exam  Vitals reviewed. Constitutional: She appears well-developed and well-nourished.  Cardiovascular: Normal rate and normal heart sounds.  Respiratory: Effort normal and breath sounds normal.  GI: She exhibits no distension. There is no tenderness.    Lymphadenopathy:    She has no cervical adenopathy.  Neurological: She is disoriented.    Assessment/Plan: PEH  She certainly has PEH that is likely symptomatic. I discussed with Dr Penelope Coop. I think first the esophageal stricture needs to be evaluated and will plan egd tomorrow.  Pending that will need to talk to patient and her family about options.  She has no indication for surgery now and does not appear to have volvulus or ischemia.   Rolm Bookbinder 11/27/2017, 2:30 PM

## 2017-11-27 NOTE — Progress Notes (Signed)
[facility] Progress Note  Subjective: appreicate efforts / assistance of GI and gen surg. For EGD tomorrow.   Vitals:   11/27/17 0500 11/27/17 0827 11/27/17 0939 11/27/17 1409  BP:  (!) 183/79 (!) 170/105 129/87  Pulse: 75 71  70  Resp:    20  Temp:    97.7 F (36.5 C)  TempSrc:    Oral  SpO2: 99%   94%  Weight:      Height:        Inpatient medications: . hydrALAZINE  5 mg Intravenous Once  . insulin aspart  0-9 Units Subcutaneous TID WC  . losartan  50 mg Oral Daily  . metoprolol tartrate  12.5 mg Oral BID  . QUEtiapine  25 mg Oral QHS  . warfarin  2 mg Oral ONCE-1800  . warfarin  2 mg Oral ONCE-1800  . Warfarin - Pharmacist Dosing Inpatient   Does not apply q1800   . sodium chloride    . famotidine (PEPCID) IV 20 mg (11/27/17 0830)   acetaminophen **OR** acetaminophen, hydrALAZINE, HYDROmorphone (DILAUDID) injection, ondansetron **OR** ondansetron (ZOFRAN) IV  Exam: GEN: pt is in pain, moaning, Ox 3, not in distress, dry heaves Neck: No mass palpated no neck rigidity no JVD appreciated. Respiratory: No rhonchi or crepitations.  Cardiovascular: S1-S2 heard. Abdomen: mild diffuse tenderness, no rebound, no ascites or mass, no hsm Musculoskeletal: No edema. Neurologic: NF, Ox 3 Psychiatric: Appears normal per normal affect.     Brief Summary: Lisa Hopkins is a 84 y.o. female with history of atrial fibrillation, embolic stroke, diabetes mellitus type 2, hypertension was brought to the ER after patient was having persistent nausea vomiting.  Patient symptoms started 2 days ago after patient had dinner at her living facility.  She had come to the ER same night and had CT abdomen and pelvis which was unremarkable and was discharged home after being treated symptomatically.  Patient also was given Carafate.  After reaching her facility patient son states that she had benign persistent vomiting unable to keep in anything and was brought to the ER again yesterday.   Denied any chest pain but has been having bandlike pain around the epigastric area.  Had one episode of diarrhea during the first episode of vomiting.  No one is sick at the living facility.  Had not had any leftover food.  Had last food was signed patient brought by her son.  ED Course: In the ER acute abdominal series was unremarkable CT abdomen pelvis one day prior did not show anything acute except for extra and hip intrahepatic biliary dilatation which as per the radiologist was reservoir effect from previous cholecystectomy.  Labs were largely unremarkable.  Patient admitted for further observation due to persistent vomiting.  Patient symptoms in the ER tonight improved with 1 dose of ondansetron.   Principal Problem:   Intractable vomiting with nausea Active Problems:   Essential hypertension   Atrial fibrillation ([facility])   History of CVA (cerebrovascular accident)   Type 2 diabetes mellitus with diabetic neuropathy, without long-term current use of insulin ([facility])   CKD stage 2 due to type 2 diabetes mellitus ([facility])   Hiatal hernia   Impression/Plan:  1) Intractable nausea vomiting / abd pain: hx of PEH.  Seen by GI and gen surgery today. Plan is for EGD tomorrow to eval for stricture.  Cont IV PPI.  Await other rec's from specialists. - cont low dose prn dilaudid IV 0.25- 0.5 mg prn - cont IV  pepcid 20 mg bid - CT abd no acute findings  2) Atrial fib - on Coumadin and metoprolol at home.  Not able to keep coumadin down here but INR is still in range at 2.4 today - start IV heparin if INR falls below Rx - cont metoprolol 12.5 bid  3) History of embolic CVA on Coumadin.  See #2.  4) Hypertension - cont metoprolol and Cozaar. BP's up due to pain not controlled   5) History of diabetes mellitus type 2: diet controlled at home. BS' ok  here - continue SSI   DVT prophylaxis: Coumadin. Code Status: DNR. Family Communication: Patient's son at bedside Disposition Plan: Back to  facility. Consults called: GI/ gen surgery Admission status: Observation.   [name] MD [facility] pgr [phone] 11/27/2017, 5:09 PM   Recent Labs  Lab 11/24/17 1950 11/25/17 1457 11/26/17 0631  NA 140 139 139  K 3.9 3.7 3.8  CL 102 101 103  CO2 26 25 24   GLUCOSE 140* 203* 119*  BUN 20 19 21   CREATININE 1.20* 0.90 0.89  CALCIUM 9.4 9.6 8.9   Recent Labs  Lab 11/24/17 1950 11/25/17 1457 11/26/17 0631  AST 23 23 21   ALT 15 17 14   ALKPHOS 104 121 81  BILITOT 1.0 1.3* 1.0  PROT 6.5 7.7 5.8*  ALBUMIN 3.7 4.0 3.1*   Recent Labs  Lab 11/25/17 1457 11/26/17 0631 11/27/17 0431  WBC 14.0* 11.8* 12.2*  HGB 16.6* 13.8 14.4  HCT 50.4* 42.4 44.0  MCV 94.6 95.1 94.6  PLT 205 194 209   Iron/TIBC/Ferritin/ %Sat No results found for: IRON, TIBC, FERRITIN, IRONPCTSAT

## 2017-11-27 NOTE — Consult Note (Signed)
Subjective:   HPI  Lisa Hopkins is a 84 y.o female with atrial fibrillation on warfarin, DM, and HTN who presented to the ED with persistent nausea and vomiting on 6/30. Symptoms initially began after eating dinner 2 days prior. At that time she came to the ED an CT abdomen/pelvis illustrated a large hiatal hernia. She was already taking a PPI so she was started on Carafate and discharged home with GI follow-up. However, shortly there after she began to have persistent N/V and came back to the ED. She was subsequently admitted for further evaluation.   This morning she is doing much better and feels her pain is significantly improved. She is no longer nauseous and was able to tolerate a little bit of coffee, chicken broth, and jello this AM. She has known about her hiatal hernia for +40 years and states that she has had similar episode to this in the past. The most recent one was 3-4 years ago. She has never had any further studies to evaluate her hiatal hernia. The patient, and her son, have discussed surgery and given her age would like to avoid it if possible. She denies recent illness, severe abdominal pain, hematemesis, hematochezia.   Review of Systems 12 point ROS preformed. All negative aside from those mentioned in the HPI.  Past Medical History:  Diagnosis Date  . Anxiety   . Aphasia as late effect of cerebrovascular accident   . Atrial fibrillation ([facility])   . CVA (cerebral infarction)   . Diabetes mellitus type II   . GERD (gastroesophageal reflux disease)   . Hiatal hernia   . HTN (hypertension)   . Hyperlipidemia   . IBS (irritable bowel syndrome)   . Stroke ([facility])   . Vitamin D deficiency    Past Surgical History:  Procedure Laterality Date  . CATARACT EXTRACTION, BILATERAL    . CHOLECYSTECTOMY    . COLONOSCOPY  2005  . TONSILLECTOMY    . UPPER GASTROINTESTINAL ENDOSCOPY  2005   Social History   Socioeconomic History  . Marital status: Married    Spouse name: Not  on file  . Number of children: Not on file  . Years of education: Not on file  . Highest education level: Not on file  Occupational History  . Not on file  Social Needs  . Financial resource strain: Not on file  . Food insecurity:    Worry: Not on file    Inability: Not on file  . Transportation needs:    Medical: Not on file    Non-medical: Not on file  Tobacco Use  . Smoking status: Never Smoker  . Smokeless tobacco: Never Used  Substance and Sexual Activity  . Alcohol use: No  . Drug use: No  . Sexual activity: Never  Lifestyle  . Physical activity:    Days per week: Not on file    Minutes per session: Not on file  . Stress: Not on file  Relationships  . Social connections:    Talks on phone: Not on file    Gets together: Not on file    Attends religious service: Not on file    Active member of club or organization: Not on file    Attends meetings of clubs or organizations: Not on file    Relationship status: Not on file  . Intimate partner violence:    Fear of current or ex partner: Not on file    Emotionally abused: Not on file    Physically  abused: Not on file    Forced sexual activity: Not on file  Other Topics Concern  . Not on file  Social History Narrative  . Not on file   family history includes Arthritis in her sister and sister; Diabetes in her mother; Heart disease in her brother; Hypertension in her brother; Stroke in her mother.  Current Facility-Administered Medications:  .  acetaminophen (TYLENOL) tablet 650 mg, 650 mg, Oral, Q6H PRN **OR** acetaminophen (TYLENOL) suppository 650 mg, 650 mg, Rectal, Q6H PRN, Eduard Clos, MD .  famotidine (PEPCID) IVPB 20 mg premix, 20 mg, Intravenous, Q12H, Delano Metz, MD, Last Rate: 100 mL/hr at 11/27/17 0830, 20 mg at 11/27/17 0830 .  hydrALAZINE (APRESOLINE) injection 5 mg, 5 mg, Intravenous, Once, Eduard Clos, MD, Stopped at 11/25/17 2257 .  hydrALAZINE (APRESOLINE) injection 5 mg, 5 mg,  Intravenous, Q6H PRN, Delano Metz, MD, 5 mg at 11/26/17 2306 .  HYDROmorphone (DILAUDID) injection 0.25-0.5 mg, 0.25-0.5 mg, Intravenous, Q4H PRN, Delano Metz, MD, 0.5 mg at 11/26/17 1642 .  insulin aspart (novoLOG) injection 0-9 Units, 0-9 Units, Subcutaneous, TID WC, Eduard Clos, MD, 1 Units at 11/27/17 [phone] .  losartan (COZAAR) tablet 50 mg, 50 mg, Oral, Daily, Eduard Clos, MD, 50 mg at 11/27/17 0827 .  metoprolol tartrate (LOPRESSOR) tablet 12.5 mg, 12.5 mg, Oral, BID, Eduard Clos, MD, 12.5 mg at 11/27/17 0827 .  ondansetron (ZOFRAN) tablet 4 mg, 4 mg, Oral, Q6H PRN, 4 mg at 11/26/17 1212 **OR** ondansetron (ZOFRAN) injection 4 mg, 4 mg, Intravenous, Q6H PRN, Eduard Clos, MD, 4 mg at 11/26/17 1856 .  QUEtiapine (SEROQUEL) tablet 25 mg, 25 mg, Oral, QHS, Eduard Clos, MD, 25 mg at 11/26/17 0227 .  warfarin (COUMADIN) tablet 2 mg, 2 mg, Oral, ONCE-1800, Delano Metz, MD .  warfarin (COUMADIN) tablet 2 mg, 2 mg, Oral, ONCE-1800, Delano Metz, MD .  Warfarin - Pharmacist Dosing Inpatient, , Does not apply, q1800, Delano Metz, MD Allergies  Allergen Reactions  . Ace Inhibitors     Cough  . Fosamax [Alendronate Sodium]     Heart burn  . Norvasc [Amlodipine Besylate]     edema  . Zocor [Simvastatin]     Elevated CPK    Objective:     BP (!) 170/105 (BP Location: Right Arm)   Pulse 71   Temp 98.5 F (36.9 C) (Oral)   Resp 17   Ht 5' 2.4" (1.585 m)   Wt 119 lb 4.3 oz (54.1 kg)   SpO2 99%   BMI 21.54 kg/m   General: Thin elderly female in no acute distress HENT: Normocephalic, atraumatic, moist mucus membranes Pulm: Good air movement with no wheezing or crackles  CV: RRR, no murmurs, no rubs  Abdomen: Hypoactive bowel sounds, soft, non-distended, epigastric tenderness to palpation  Extremities: Pulses palpable in all extremities, no LE edema  Skin: Warm and dry  Neuro: Alert and oriented x 3  Laboratory CMP      Component Value Date/Time   NA 139 11/26/2017 0631   K 3.8 11/26/2017 0631   CL 103 11/26/2017 0631   CO2 24 11/26/2017 0631   GLUCOSE 119 (H) 11/26/2017 0631   BUN 21 11/26/2017 0631   CREATININE 0.89 11/26/2017 0631   CREATININE 1.03 (H) 11/08/2017 1100   CALCIUM 8.9 11/26/2017 0631   PROT 5.8 (L) 11/26/2017 0631   ALBUMIN 3.1 (L) 11/26/2017 0631   AST 21 11/26/2017 0631   ALT 14 11/26/2017 0631  ALKPHOS 81 11/26/2017 0631   BILITOT 1.0 11/26/2017 0631   GFRNONAA 55 (L) 11/26/2017 0631   GFRNONAA 48 (L) 11/08/2017 1100   GFRAA >60 11/26/2017 0631   GFRAA 55 (L) 11/08/2017 1100   CBC    Component Value Date/Time   WBC 12.2 (H) 11/27/2017 0431   RBC 4.65 11/27/2017 0431   HGB 14.4 11/27/2017 0431   HCT 44.0 11/27/2017 0431   PLT 209 11/27/2017 0431   MCV 94.6 11/27/2017 0431   MCH 31.0 11/27/2017 0431   MCHC 32.7 11/27/2017 0431   RDW 12.9 11/27/2017 0431   LYMPHSABS 2,401 11/08/2017 1100   MONOABS 576 12/26/2016 1216   EOSABS 159 11/08/2017 1100   BASOSABS 62 11/08/2017 1100    Assessment:    Lisa Hopkins is a 84 y.o female with atrial fibrillation on warfarin, DM, and HTN who presented to the ED with persistent nausea and vomiting on 6/30 and subsequently found to have a large hiatal hernia.     Plan:     Hiatal Hernia, probable paraesophageal  - CT scan reviewed, appears to be a paraesophageal hernia with displacement of the GE junction. Could get a barium swallow or endoscopy to further characterize the hernia but do not feel this would add much unless the patient were to go for surgery.  - Possible that the patient's symptoms were secondary to partial obstruction as it appears the whole stomach is not herniated on imaging. Otherwise symptoms are begining to improve.  - No emergent indications for surgery (volvulus, bleeding, obstruction, strangulation, perforation, or respiratory compromise) - Continue IV Famotidine and symptom management. Would transition  to high dose BID PPI on discharge  - Consider surgical consult or outpatient surgical evaluation   Levora DredgeJustin Paizlee Kinder, MD  Internal Medicine Resident, PGY2

## 2017-11-27 NOTE — Progress Notes (Addendum)
ANTICOAGULATION CONSULT NOTE   Pharmacy Consult for coumadin Indication: atrial fibrillation  Allergies  Allergen Reactions  . Ace Inhibitors     Cough  . Fosamax [Alendronate Sodium]     Heart burn  . Norvasc [Amlodipine Besylate]     edema  . Zocor [Simvastatin]     Elevated CPK    Patient Measurements: Height: 5' 2.4" (158.5 cm) Weight: 119 lb 4.3 oz (54.1 kg) IBW/kg (Calculated) : 51.02  Vital Signs: Temp: 98.5 F (36.9 C) (07/02 0458) Temp Source: Oral (07/02 0458) BP: 183/79 (07/02 0827) Pulse Rate: 71 (07/02 0827)  Labs: Recent Labs    11/24/17 1950  11/24/17 2041 11/25/17 1457 11/26/17 0052 11/26/17 0631 11/26/17 1230 11/27/17 0431  HGB 15.1*  --   --  16.6*  --  13.8  --  14.4  HCT 47.3*  --   --  50.4*  --  42.4  --  44.0  PLT 204  --   --  205  --  194  --  209  LABPROT  --   --  24.1*  --   --  26.0*  --  26.1*  INR  --   --  2.18  --   --  2.40  --  2.41  CREATININE 1.20*  --   --  0.90  --  0.89  --   --   TROPONINI  --    < > <0.03  --  <0.03 <0.03 <0.03  --    < > = values in this interval not displayed.    Estimated Creatinine Clearance: 33.8 mL/min (by C-G formula based on SCr of 0.89 mg/dL).   Medical History: Past Medical History:  Diagnosis Date  . Anxiety   . Aphasia as late effect of cerebrovascular accident   . Atrial fibrillation (HCC)   . CVA (cerebral infarction)   . Diabetes mellitus type II   . GERD (gastroesophageal reflux disease)   . Hiatal hernia   . HTN (hypertension)   . Hyperlipidemia   . IBS (irritable bowel syndrome)   . Stroke (HCC)   . Vitamin D deficiency     Medications:  Medications Prior to Admission  Medication Sig Dispense Refill Last Dose  . acetaminophen (TYLENOL) 325 MG tablet Take 325-650 mg by mouth every 4 (four) hours as needed for mild pain, moderate pain or headache.    unknown at prn  . Cholecalciferol (VITAMIN D) 2000 UNITS CAPS Take 2,000 Units by mouth daily.    11/25/2017 at Unknown  time  . dicyclomine (BENTYL) 20 MG tablet Take 1 tablet 3 x day if needed for nausea, bloating , cramping or diarrhea 90 tablet 1 unknown at prn  . furosemide (LASIX) 20 MG tablet Take 1 tablet daily for BP & fluid retention (Patient taking differently: Take 20 mg by mouth daily. Take 1 tablet daily for BP & fluid retention) 90 tablet 1 11/25/2017 at Unknown time  . losartan (COZAAR) 50 MG tablet TAKE 1 TABLET EVERY MORNING, CHECK BP AT LUNCH-IF BP ABOVE 140/90 TAKE ANOTHER TABLET. 60 tablet 0 11/25/2017 at Unknown time  . Magnesium Hydroxide (MAGNESIA PO) Take 1 tablet by mouth daily.    11/25/2017 at Unknown time  . metoprolol tartrate (LOPRESSOR) 25 MG tablet Take 0.5 tablets (12.5 mg total) by mouth 2 (two) times daily. 180 tablet 3 11/25/2017 at 0900  . pantoprazole (PROTONIX) 40 MG tablet TAKE 1 TABLET ONCE DAILY FOR REFLUX. 90 tablet 0 11/25/2017 at Unknown time  .  potassium chloride (K-DUR) 10 MEQ tablet TAKE 1 TABLET BY MOUTH DAILY. 90 tablet 1 11/25/2017 at Unknown time  . QUEtiapine (SEROQUEL) 25 MG tablet Take 1 tablet at bedtime 90 tablet 1 11/24/2017  . ranitidine (ZANTAC) 300 MG tablet Take 1 tablet (300 mg total) by mouth 2 (two) times daily. 180 tablet 1 11/25/2017 at Unknown time  . warfarin (COUMADIN) 2 MG tablet TAKE AS DIRECTED BY ANTICOAGULATION CLINIC. (Patient taking differently: take 2 mg tablet daily) 30 tablet 2 11/25/2017 at 0900    Assessment: 84 yo lady admitted with abdominal pain to resume coumadin for h/o afib.  INR 6/29 was 2.18.    INR today = 2.4  Goal of Therapy:  INR 2-3 Monitor platelets by anticoagulation protocol: Yes   Plan:  Warfarin 2 mg po x 1 tonight Daily PT/INR Monitor for bleeding complications  Thank you Okey RegalLisa Chasen Mendell, PharmD [phone]  11/27/2017,9:33 AM

## 2017-11-28 ENCOUNTER — Inpatient Hospital Stay (HOSPITAL_COMMUNITY): Payer: Medicare Other | Admitting: Certified Registered Nurse Anesthetist

## 2017-11-28 ENCOUNTER — Encounter (HOSPITAL_COMMUNITY)
Admission: EM | Disposition: A | Discharge status: Home / Self Care | Payer: Self-pay | Source: Home / Self Care | Attending: Nephrology

## 2017-11-28 ENCOUNTER — Encounter (HOSPITAL_COMMUNITY): Payer: Self-pay

## 2017-11-28 HISTORY — PX: ESOPHAGOGASTRODUODENOSCOPY (EGD) WITH PROPOFOL: SHX5813

## 2017-11-28 LAB — CBC
HCT: 46.6 % — ABNORMAL HIGH (ref 36.0–46.0)
Hemoglobin: 15 g/dL (ref 12.0–15.0)
MCH: 30.5 pg (ref 26.0–34.0)
MCHC: 32.2 g/dL (ref 30.0–36.0)
MCV: 94.7 fL (ref 78.0–100.0)
PLATELETS: 216 10*3/uL (ref 150–400)
RBC: 4.92 MIL/uL (ref 3.87–5.11)
RDW: 12.7 % (ref 11.5–15.5)
WBC: 10.1 10*3/uL (ref 4.0–10.5)

## 2017-11-28 LAB — GLUCOSE, CAPILLARY
GLUCOSE-CAPILLARY: 114 mg/dL — AB (ref 70–99)
GLUCOSE-CAPILLARY: 106 mg/dL — AB (ref 70–99)
Glucose-Capillary: 120 mg/dL — ABNORMAL HIGH (ref 70–99)

## 2017-11-28 LAB — BASIC METABOLIC PANEL
ANION GAP: 8 (ref 5–15)
BUN: 16 mg/dL (ref 8–23)
CO2: 26 mmol/L (ref 22–32)
CREATININE: 0.89 mg/dL (ref 0.44–1.00)
Calcium: 8.9 mg/dL (ref 8.9–10.3)
Chloride: 102 mmol/L (ref 98–111)
GFR calc Af Amer: >60 mL/min (ref >60)
GFR, EST NON AFRICAN AMERICAN: 55 mL/min — AB (ref >60)
Glucose, Bld: 122 mg/dL — ABNORMAL HIGH (ref 70–99)
POTASSIUM: 3.4 mmol/L — AB (ref 3.5–5.1)
Sodium: 136 mmol/L (ref 135–145)

## 2017-11-28 LAB — PROTIME-INR
INR: 2.49
Prothrombin Time: 26.7 seconds — ABNORMAL HIGH (ref 11.4–15.2)

## 2017-11-28 SURGERY — ESOPHAGOGASTRODUODENOSCOPY (EGD) WITH PROPOFOL
Anesthesia: Monitor Anesthesia Care

## 2017-11-28 MED ORDER — PROPOFOL 10 MG/ML IV BOLUS
INTRAVENOUS | Status: DC | PRN
  Administered 2017-11-28: 20 mg via INTRAVENOUS
  Administered 2017-11-28: 10 mg via INTRAVENOUS

## 2017-11-28 MED ORDER — PROPOFOL 500 MG/50ML IV EMUL
INTRAVENOUS | Status: DC | PRN
  Administered 2017-11-28: 150 ug/kg/min via INTRAVENOUS

## 2017-11-28 MED ORDER — POTASSIUM CHLORIDE CRYS ER 20 MEQ PO TBCR
40.0000 meq | EXTENDED_RELEASE_TABLET | Freq: Once | ORAL | Status: AC
  Administered 2017-11-28: 40 meq via ORAL
  Filled 2017-11-28: qty 2

## 2017-11-28 MED ORDER — WARFARIN SODIUM 2 MG PO TABS
2.0000 mg | ORAL_TABLET | Freq: Every day | ORAL | Status: DC
  Administered 2017-11-28: 2 mg via ORAL
  Filled 2017-11-28: qty 1

## 2017-11-28 SURGICAL SUPPLY — 15 items

## 2017-11-28 NOTE — Anesthesia Preprocedure Evaluation (Addendum)
Anesthesia Evaluation  Patient identified by MRN, date of birth, ID band Patient confused    Reviewed: Allergy & Precautions, NPO status , Patient's Chart, lab work & pertinent test results  Airway Mallampati: II  TM Distance: >3 FB Neck ROM: Full    Dental  (+) Teeth Intact, Loose, Dental Advisory Given,    Pulmonary    breath sounds clear to auscultation       Cardiovascular hypertension,  Rhythm:Irregular Rate:Normal     Neuro/Psych    GI/Hepatic   Endo/Other  diabetes  Renal/GU      Musculoskeletal   Abdominal   Peds  Hematology   Anesthesia Other Findings   Reproductive/Obstetrics                            Anesthesia Physical Anesthesia Plan  ASA: III  Anesthesia Plan: MAC   Post-op Pain Management:    Induction: Intravenous  PONV Risk Score and Plan: Propofol infusion and Ondansetron  Airway Management Planned:   Additional Equipment:   Intra-op Plan:   Post-operative Plan:   Informed Consent: I have reviewed the patients History and Physical, chart, labs and discussed the procedure including the risks, benefits and alternatives for the proposed anesthesia with the patient or authorized representative who has indicated his/her understanding and acceptance.     Plan Discussed with: CRNA and Anesthesiologist  Anesthesia Plan Comments:         Anesthesia Quick Evaluation

## 2017-11-28 NOTE — Anesthesia Procedure Notes (Signed)
Procedure Name: MAC Date/Time: 11/28/2017 12:18 PM Performed by: Wilburn Cornelia, CRNA Pre-anesthesia Checklist: Patient identified, Emergency Drugs available, Suction available, Patient being monitored and Timeout performed Patient Re-evaluated:Patient Re-evaluated prior to induction Oxygen Delivery Method: Nasal cannula Placement Confirmation: positive ETCO2 and breath sounds checked- equal and bilateral Dental Injury: Teeth and Oropharynx as per pre-operative assessment

## 2017-11-28 NOTE — Progress Notes (Signed)
  [facility] Progress Note  Day of Surgery  Subjective: CC-  Patient states that she slept well last night. Denies any current abdominal pain, but does have mild global tenderness with abdominal palpation. States that she did not eat much dinner last night because it did not taste good. BM yesterday. Going for EGD today.  Objective: Vital signs in last 24 hours: Temp:  [97.7 F (36.5 C)-98.9 F (37.2 C)] 97.9 F (36.6 C) (07/03 0512) Pulse Rate:  [70-90] 75 (07/03 0512) Resp:  [18-20] 18 (07/03 0512) BP: (129-183)/(71-108) 134/71 (07/03 0512) SpO2:  [93 %-96 %] 93 % (07/03 0512) Weight:  [115 lb (52.2 kg)] 115 lb (52.2 kg) (07/03 0512) Last BM Date: 11/23/17  Intake/Output from previous day: 07/02 0701 - 07/03 0700 In: 250 [P.O.:250] Out: -  Intake/Output this shift: No intake/output data recorded.  PE: Gen:  Alert, NAD, pleasant HEENT: EOM's intact, pupils equal and round Pulm:  CTAB, no W/R/R, effort normal Abd: Soft, ND, mild diffuse tenderness without rebound or guarding, +BS Ext:  Calves soft and nontender Skin: no rashes noted, warm and dry  Lab Results:  Recent Labs    11/27/17 0431 11/28/17 0244  WBC 12.2* 10.1  HGB 14.4 15.0  HCT 44.0 46.6*  PLT 209 216   BMET Recent Labs    11/26/17 0631 11/28/17 0244  NA 139 136  K 3.8 3.4*  CL 103 102  CO2 24 26  GLUCOSE 119* 122*  BUN 21 16  CREATININE 0.89 0.89  CALCIUM 8.9 8.9   PT/INR Recent Labs    11/27/17 0431 11/28/17 0244  LABPROT 26.1* 26.7*  INR 2.41 2.49   CMP     Component Value Date/Time   NA 136 11/28/2017 0244   K 3.4 (L) 11/28/2017 0244   CL 102 11/28/2017 0244   CO2 26 11/28/2017 0244   GLUCOSE 122 (H) 11/28/2017 0244   BUN 16 11/28/2017 0244   CREATININE 0.89 11/28/2017 0244   CREATININE 1.03 (H) 11/08/2017 1100   CALCIUM 8.9 11/28/2017 0244   PROT 5.8 (L) 11/26/2017 0631   ALBUMIN 3.1 (L) 11/26/2017 0631   AST 21 11/26/2017 0631   ALT 14 11/26/2017 0631    ALKPHOS 81 11/26/2017 0631   BILITOT 1.0 11/26/2017 0631   GFRNONAA 55 (L) 11/28/2017 0244   GFRNONAA 48 (L) 11/08/2017 1100   GFRAA >60 11/28/2017 0244   GFRAA 55 (L) 11/08/2017 1100   Lipase     Component Value Date/Time   LIPASE 37 11/26/2017 0631       Studies/Results: No results found.  Anti-infectives: Anti-infectives (From admission, onward)   None       Assessment/Plan DM HTN Afib - on coumadin (INR 2.49 today) H/o CVA Code status DNR  Intractable n/v, abd pain - unsure if 2/2 Large hiatal hernia or Proximal esophageal stricture. No signs of volvulus or intestinal ischemia - EGD today to evaluate for esophageal obstruction. Will continue to follow.  ID - none FEN - CLD VTE - SCDs, coumadin (last dose 7/2, INR 2.49 today) Foley - none   LOS: 1 day    Franne FortsBrooke A Meuth , [facility] 11/28/2017, 8:22 AM Pager: [phone] Consults: [phone] Mon 7:00 am -11:30 AM Tues-Fri 7:00 am-4:30 pm Sat-Sun 7:00 am-11:30 am

## 2017-11-28 NOTE — Progress Notes (Signed)
[facility]                                                                              Patient Demographics  Lisa Hopkins, is a 84 y.o. female, DOB - August 14, 1924, WUJ:[id number]  Admit date - 11/25/2017   Admitting Physician Eduard Clos, MD  Outpatient Primary MD for the patient is Lucky Cowboy, MD  Outpatient specialists:   LOS - 1  days   Medical records reviewed and are as summarized below:    Chief Complaint  Patient presents with  . Emesis       Brief summary   Lisa Hopkins a 84 y.o.femalewithhistory of atrial fibrillation, embolic stroke, diabetes mellitus type 2, hypertension was brought to the ER after patient was having persistent nausea vomiting. Patient symptoms started 2 days prior to admission after patient had dinner at her living facility. She had come to the ER same night and had CT abdomen and pelvis which was unremarkable and was discharged home after being treated symptomatically. Patient also was given Carafate. After reaching her facility patient son states that she had benign persistent vomiting unable to keep in anything and was brought to the ER again   Assessment & Plan    Principal Problem:   Intractable vomiting with nausea -CT showed a paraesophageal hernia with displacement of GE junction -GI and general surgery was consulted.  Patient was placed on IV famotidine. -EGD showed large hiatal hernia, benign-appearing esophageal stenosis otherwise normal -will follow general surgery recommendations -Continue full liquid diet  Active Problems:   Essential hypertension -Continue metoprolol, Cozaar     Atrial fibrillation (HCC) -Currently rate controlled, continue metoprolol Continue Coumadin    History of CVA (cerebrovascular accident) -Continue Coumadin    Type 2 diabetes mellitus with diabetic neuropathy, without long-term current use of insulin (HCC) -Last hemoglobin A1c 6.2 -Continue  sliding scale insulin     CKD stage 2 due to type 2 diabetes mellitus (HCC) -Creatinine at baseline, no acute issues  Hypokalemia Replaced  Code Status: DNR DVT Prophylaxis: Coumadin Family Communication: Discussed in detail with the patient, all imaging results, lab results explained to the patient   Disposition Plan: Possibly next 24 hours if tolerating diet and no other surgical interventions planned  Time Spent in minutes 25 minutes  Procedures:  EGD  Consultants:   GI General surgery  Antimicrobials:      Medications  Scheduled Meds: . hydrALAZINE  5 mg Intravenous Once  . insulin aspart  0-9 Units Subcutaneous TID WC  . losartan  50 mg Oral Daily  . metoprolol tartrate  12.5 mg Oral BID  . QUEtiapine  25 mg Oral QHS  . warfarin  2 mg Oral q1800  . Warfarin - Pharmacist Dosing Inpatient   Does not apply q1800   Continuous Infusions: . sodium chloride 65 mL/hr at 11/28/17 0942  . famotidine (PEPCID) IV 20 mg (11/28/17 0940)   PRN Meds:.acetaminophen **OR** acetaminophen, hydrALAZINE, HYDROmorphone (DILAUDID) injection, ondansetron **OR** ondansetron (ZOFRAN) IV   Antibiotics   Anti-infectives (From admission, onward)   None        Subjective:   Lisa Calico  Hopkins was seen and examined today.  Patient denies dizziness, chest pain, shortness of breath, abdominal pain, N/V/D/C, new weakness, numbess, tingling. No acute events overnight.    Objective:   Vitals:   11/28/17 1121 11/28/17 1231 11/28/17 1241 11/28/17 1251  BP: (!) 208/87 (!) 101/41 (!) 108/44 (!) 156/60  Pulse: 66 69 (!) 59 (!) 59  Resp: 15 15 15 16   Temp: 97.9 F (36.6 C) 98.7 F (37.1 C)    TempSrc: Oral Oral    SpO2: 96% 99% 95% 98%  Weight:      Height:        Intake/Output Summary (Last 24 hours) at 11/28/2017 1349 Last data filed at 11/28/2017 1227 Gross per 24 hour  Intake 100 ml  Output -  Net 100 ml     Wt Readings from Last 3 Encounters:  11/28/17 52.2 kg (115 lb)    11/24/17 54.9 kg (121 lb)  11/08/17 55.2 kg (121 lb 9.6 oz)     Exam  General: Alert and oriented x 3, NAD  Eyes: PERRLA, EOMI, Anicteric Sclera,  HEENT:  Atraumatic, normocephalic, normal oropharynx  Cardiovascular: S1 S2 auscultated, no rubs, murmurs or gallops. Regular rate and rhythm.  Respiratory: Clear to auscultation bilaterally, no wheezing, rales or rhonchi  Gastrointestinal: Soft, mild diffuse tenderness, no rebound or guarding,  + bowel sounds  Ext: no pedal edema bilaterally  Neuro: no new deficits  Musculoskeletal: No digital cyanosis, clubbing  Skin: No rashes  Psych: Normal affect and demeanor, alert and oriented x3    Data Reviewed:  I have personally reviewed following labs and imaging studies  Micro Results No results found for this or any previous visit (from the past 240 hour(s)).  Radiology Reports Ct Abdomen Pelvis W Contrast  Result Date: 11/24/2017 CLINICAL DATA:  Epigastric pain after dinner. EXAM: CT ABDOMEN AND PELVIS WITH CONTRAST TECHNIQUE: Multidetector CT imaging of the abdomen and pelvis was performed using the standard protocol following bolus administration of intravenous contrast. CONTRAST:  OMNIPAQUE IOHEXOL 300 MG/ML  SOLN COMPARISON:  CT 06/08/2014, CXR 04/02/2015 FINDINGS: Lower chest: Redemonstration of large hiatal hernia containing much of the stomach. Heart size is borderline enlarged but stable. There is atelectasis at each base. Hepatobiliary: Hepatic steatosis. Cholecystectomy. No space-occupying mass of the liver. Biliary dilatation compatible with reservoir effect status post cholecystectomy. No choledocholithiasis. Pancreas: Pancreatic atrophy. Ectasia of the pancreatic duct without pancreatic mass or inflammation. Spleen: Normal Adrenals/Urinary Tract: Normal bilateral adrenal glands. Symmetric cortical enhancement of both kidneys with mild cortical scarring along lateral aspect of both kidneys. No nephrolithiasis nor  hydroureteronephrosis. The urinary bladder is unremarkable for the degree of distention. Stomach/Bowel: Normal small bowel rotation without obstruction. Moderate stool retention within the colon with scattered colonic diverticulosis. No acute diverticulitis. Appendix not well visualized but no pericecal inflammatory change noted. Vascular/Lymphatic: Mild mild aortoiliac and branch vessel atherosclerosis without aneurysm. No adenopathy. Reproductive: Calcified uterine fibroids.  No adnexal mass. Other: No free air or free fluid. Musculoskeletal: No worrisome lytic or sclerotic lesions. Degenerative change noted of the dorsal spine SI joints and pubic symphysis. IMPRESSION: 1. Repeat demonstration of large hiatal hernia. No acute bowel obstruction or inflammation. Scattered colonic diverticulosis without acute diverticulitis. 2. Mild hepatic steatosis. Reservoir effect status post cholecystectomy accounting for intra and extrahepatic ductal dilatation. 3. Calcified uterine fibroids.  Adnexal masses Electronically Signed   By: Tollie Eth M.D.   On: 11/24/2017 21:35   Dg Abd Acute W/chest  Result Date: 11/25/2017 CLINICAL  DATA:  84 y/o  F; upper abdominal pain, nausea, vomiting. EXAM: DG ABDOMEN ACUTE W/ 1V CHEST COMPARISON:  11/24/2017 CT of abdomen and pelvis. FINDINGS: Large hiatal hernia obscuring the left lung base. Visualized lung fields are clear. Aortic atherosclerosis. Cardiac silhouette obscured by left lung base hiatal hernia. Right upper quadrant surgical clips. Normal bowel gas pattern. Multilevel degenerative changes of the spine, no acute osseous abnormality is evident. Calcified uterine myoma. IMPRESSION: Large hiatal hernia obscuring left lung base. No acute pulmonary process identified. Normal abdominal and pelvic bowel gas pattern. Electronically Signed   By: Mitzi Hansen M.D.   On: 11/25/2017 20:48    Lab Data:  CBC: Recent Labs  Lab 11/24/17 1950 11/25/17 1457  11/26/17 0631 11/27/17 0431 11/28/17 0244  WBC 7.9 14.0* 11.8* 12.2* 10.1  HGB 15.1* 16.6* 13.8 14.4 15.0  HCT 47.3* 50.4* 42.4 44.0 46.6*  MCV 97.7 94.6 95.1 94.6 94.7  PLT 204 205 194 209 216   Basic Metabolic Panel: Recent Labs  Lab 11/24/17 1950 11/25/17 1457 11/26/17 0631 11/28/17 0244  NA 140 139 139 136  K 3.9 3.7 3.8 3.4*  CL 102 101 103 102  CO2 26 25 24 26   GLUCOSE 140* 203* 119* 122*  BUN 20 19 21 16   CREATININE 1.20* 0.90 0.89 0.89  CALCIUM 9.4 9.6 8.9 8.9   GFR: Estimated Creatinine Clearance: 33.8 mL/min (by C-G formula based on SCr of 0.89 mg/dL). Liver Function Tests: Recent Labs  Lab 11/24/17 1950 11/25/17 1457 11/26/17 0631  AST 23 23 21   ALT 15 17 14   ALKPHOS 104 121 81  BILITOT 1.0 1.3* 1.0  PROT 6.5 7.7 5.8*  ALBUMIN 3.7 4.0 3.1*   Recent Labs  Lab 11/24/17 1950 11/25/17 1457 11/26/17 0631  LIPASE 55* 36 37   No results for input(s): AMMONIA in the last 168 hours. Coagulation Profile: Recent Labs  Lab 11/24/17 2041 11/26/17 0631 11/27/17 0431 11/28/17 0244  INR 2.18 2.40 2.41 2.49   Cardiac Enzymes: Recent Labs  Lab 11/24/17 2041 11/26/17 0052 11/26/17 0631 11/26/17 1230  TROPONINI <0.03 <0.03 <0.03 <0.03   BNP (last 3 results) No results for input(s): PROBNP in the last 8760 hours. HbA1C: No results for input(s): HGBA1C in the last 72 hours. CBG: Recent Labs  Lab 11/27/17 0731 11/27/17 1205 11/27/17 1640 11/27/17 2145 11/28/17 0728  GLUCAP 128* 147* 118* 107* 114*   Lipid Profile: No results for input(s): CHOL, HDL, LDLCALC, TRIG, CHOLHDL, LDLDIRECT in the last 72 hours. Thyroid Function Tests: No results for input(s): TSH, T4TOTAL, FREET4, T3FREE, THYROIDAB in the last 72 hours. Anemia Panel: No results for input(s): VITAMINB12, FOLATE, FERRITIN, TIBC, IRON, RETICCTPCT in the last 72 hours. Urine analysis:    Component Value Date/Time   COLORURINE STRAW (A) 11/24/2017 2229   APPEARANCEUR CLEAR 11/24/2017  2229   LABSPEC 1.030 11/24/2017 2229   PHURINE 7.0 11/24/2017 2229   GLUCOSEU 50 (A) 11/24/2017 2229   HGBUR NEGATIVE 11/24/2017 2229   BILIRUBINUR NEGATIVE 11/24/2017 2229   KETONESUR NEGATIVE 11/24/2017 2229   PROTEINUR NEGATIVE 11/24/2017 2229   UROBILINOGEN 1.0 04/02/2015 1244   NITRITE NEGATIVE 11/24/2017 2229   LEUKOCYTESUR NEGATIVE 11/24/2017 2229     Tea Collums M.D. [facility] 11/28/2017, 1:49 PM  Pager: [phone] Between 7am to 7pm - call Pager - [phone]  After 7pm go to [email or web address] - password [email or web address]  Call night coverage person covering after 7pm

## 2017-11-28 NOTE — Op Note (Signed)
[facility] Patient Name: Lisa Hopkins Procedure Date : 11/28/2017 MRN: [id number] Attending MD: Graylin Shiver , MD Date of Birth: 11/30/1924 CSN: [id number] Age: 84 Admit Type: Inpatient Procedure:                Upper GI endoscopy Indications:              Epigastric abdominal pain, large hiatal hernia Providers:                Graylin Shiver, MD, Dwain Sarna, RN, Verita Schneiders, Technician Referring MD:              Medicines:                Propofol per Anesthesia Complications:            No immediate complications. Estimated Blood Loss:     Estimated blood loss: none. Procedure:                Pre-Anesthesia Assessment:                           - Prior to the procedure, a History and Physical                            was performed, and patient medications and                            allergies were reviewed. The patient's tolerance of                            previous anesthesia was also reviewed. The risks                            and benefits of the procedure and the sedation                            options and risks were discussed with the patient.                            All questions were answered, and informed consent                            was obtained. Prior Anticoagulants: The patient has                            taken Coumadin (warfarin), last dose was 1 day                            prior to procedure. ASA Grade Assessment: III - A                            patient with severe systemic disease. After  reviewing the risks and benefits, the patient was                            deemed in satisfactory condition to undergo the                            procedure.                           After obtaining informed consent, the endoscope was                            passed under direct vision. Throughout the                            procedure, the patient's blood pressure,  pulse, and                            oxygen saturations were monitored continuously. The                            [id number] ([id number]) scope was introduced through the                            mouth, and advanced to the second part of duodenum.                            The upper GI endoscopy was accomplished without                            difficulty. The patient tolerated the procedure                            well. Scope In: Scope Out: Findings:      One benign-appearing, intrinsic mild stenosis was found in the proximal       esophagus, which looked like a web. Traversed with minimal resitance. No       food impaction.      A large hiatal hernia was present. Tortuous stomach, with some retained       food in portions. No obstruction however, and I was able to advance int       the duodenum.      The examined duodenum was normal.      No signs of mucosal ischemia. No ulcers. Impression:               - Benign-appearing esophageal stenosis. Looks like                            a Museum/gallery curator and traversed with minimal resistance.                           - Large hiatal hernia.                           - Normal examined duodenum.                           -  No specimens collected. Recommendation:           - Advance diet as tolerated.                           - Continue present medications. Procedure Code(s):        --- Professional ---                           [id number], Esophagogastroduodenoscopy, flexible,                            transoral; diagnostic, including collection of                            specimen(s) by brushing or washing, when performed                            (separate procedure) Diagnosis Code(s):        --- Professional ---                           K22.2, Esophageal obstruction                           K44.9, Diaphragmatic hernia without obstruction or                            gangrene                           R10.13, Epigastric pain CPT copyright  2017 [employer]. All rights reserved. The codes documented in this report are preliminary and upon coder review may  be revised to meet current compliance requirements. Graylin Shiver, MD 11/28/2017 12:37:54 PM This report has been signed electronically. Number of Addenda: 0

## 2017-11-28 NOTE — Transfer of Care (Signed)
Immediate Anesthesia Transfer of Care Note  Patient: Lisa Hopkins  Procedure(s) Performed: ESOPHAGOGASTRODUODENOSCOPY (EGD) WITH PROPOFOL (N/A )  Patient Location: Endoscopy Unit  Anesthesia Type:MAC  Level of Consciousness: sedated  Airway & Oxygen Therapy: Patient Spontanous Breathing and Patient connected to nasal cannula oxygen  Post-op Assessment: Report given to RN and Post -op Vital signs reviewed and stable  Post vital signs: Reviewed and stable  Last Vitals:  Vitals Value Taken Time  BP    Temp    Pulse    Resp    SpO2      Last Pain:  Vitals:   11/28/17 1121  TempSrc: Oral  PainSc: 0-No pain         Complications: No apparent anesthesia complications

## 2017-11-28 NOTE — H&P (Signed)
Patient here for diagnostic egd. She has a large hiatal hernia. Had a proximal esophageal narrowing on barium swallow in March.  PE no distress, Heart RRR, Lungs clear. Abdomen soft and non tender.  IMP: Large HH. Esophageal narrowing.   Plan. Diagnostic egd (on coumadin). Evaluate how bad stricture is and if there could be a food impaction.

## 2017-11-28 NOTE — Progress Notes (Signed)
ANTICOAGULATION CONSULT NOTE   Pharmacy Consult for Coumadin Indication: atrial fibrillation  Allergies  Allergen Reactions  . Ace Inhibitors     Cough  . Fosamax [Alendronate Sodium]     Heart burn  . Norvasc [Amlodipine Besylate]     edema  . Zocor [Simvastatin]     Elevated CPK    Patient Measurements: Height: 5' 2.4" (158.5 cm) Weight: 115 lb (52.2 kg) IBW/kg (Calculated) : 51.02  Vital Signs: Temp: 97.9 F (36.6 C) (07/03 0512) Temp Source: Oral (07/02 2121) BP: 134/71 (07/03 0512) Pulse Rate: 75 (07/03 0512)  Labs: Recent Labs    11/25/17 1457 11/26/17 0052 11/26/17 0631 11/26/17 1230 11/27/17 0431 11/28/17 0244  HGB 16.6*  --  13.8  --  14.4 15.0  HCT 50.4*  --  42.4  --  44.0 46.6*  PLT 205  --  194  --  209 216  LABPROT  --   --  26.0*  --  26.1* 26.7*  INR  --   --  2.40  --  2.41 2.49  CREATININE 0.90  --  0.89  --   --  0.89  TROPONINI  --  <0.03 <0.03 <0.03  --   --     Estimated Creatinine Clearance: 33.8 mL/min (by C-G formula based on SCr of 0.89 mg/dL).     Assessment: 84 yo lady admitted with abdominal pain to resume coumadin for h/o afib.  INR 6/29 was 2.18.    INR therapeutic today  EGD planned for today  Goal of Therapy:  INR 2-3 Monitor platelets by anticoagulation protocol: Yes   Plan:  Warfarin 2 mg po daily Daily PT/INR Monitor for bleeding complications  Thank you Okey RegalLisa Jerine Surles, PharmD [phone]  11/28/2017,9:14 AM

## 2017-11-28 NOTE — Plan of Care (Signed)
  Problem: Education: Goal: Knowledge of General Education information will improve Outcome: Progressing Note:  POC reviewed with pt. and so; both aware of EGD this am. ; and that pt. Is NPO at [place].

## 2017-11-28 NOTE — Anesthesia Postprocedure Evaluation (Signed)
Anesthesia Post Note  Patient: Lisa Hopkins  Procedure(s) Performed: ESOPHAGOGASTRODUODENOSCOPY (EGD) WITH PROPOFOL (N/A )     Patient location during evaluation: PACU Anesthesia Type: MAC Level of consciousness: awake and alert Pain management: pain level controlled Vital Signs Assessment: post-procedure vital signs reviewed and stable Respiratory status: spontaneous breathing, nonlabored ventilation, respiratory function stable and patient connected to nasal cannula oxygen Cardiovascular status: stable and blood pressure returned to baseline Postop Assessment: no apparent nausea or vomiting Anesthetic complications: no    Last Vitals:  Vitals:   11/28/17 1241 11/28/17 1251  BP: (!) 108/44 (!) 156/60  Pulse: (!) 59 (!) 59  Resp: 15 16  Temp:    SpO2: 95% 98%    Last Pain:  Vitals:   11/28/17 1251  TempSrc:   PainSc: 0-No pain                 Genell Thede COKER

## 2017-11-29 ENCOUNTER — Inpatient Hospital Stay (HOSPITAL_COMMUNITY)
Admission: EM | Admit: 2017-11-29 | Discharge: 2017-12-13 | DRG: 391 | Disposition: A | Discharge status: Home Health | Payer: Medicare Other | Attending: Internal Medicine | Admitting: Internal Medicine

## 2017-11-29 ENCOUNTER — Other Ambulatory Visit: Payer: Self-pay

## 2017-11-29 ENCOUNTER — Encounter (HOSPITAL_COMMUNITY): Payer: Self-pay | Admitting: Emergency Medicine

## 2017-11-29 DIAGNOSIS — E876 Hypokalemia: Secondary | ICD-10-CM | POA: Diagnosis present

## 2017-11-29 DIAGNOSIS — I10 Essential (primary) hypertension: Secondary | ICD-10-CM | POA: Diagnosis present

## 2017-11-29 DIAGNOSIS — Z888 Allergy status to other drugs, medicaments and biological substances status: Secondary | ICD-10-CM

## 2017-11-29 DIAGNOSIS — F0390 Unspecified dementia without behavioral disturbance: Secondary | ICD-10-CM | POA: Diagnosis not present

## 2017-11-29 DIAGNOSIS — I130 Hypertensive heart and chronic kidney disease with heart failure and stage 1 through stage 4 chronic kidney disease, or unspecified chronic kidney disease: Secondary | ICD-10-CM | POA: Diagnosis present

## 2017-11-29 DIAGNOSIS — Z823 Family history of stroke: Secondary | ICD-10-CM | POA: Diagnosis not present

## 2017-11-29 DIAGNOSIS — Q394 Esophageal web: Secondary | ICD-10-CM

## 2017-11-29 DIAGNOSIS — I4891 Unspecified atrial fibrillation: Secondary | ICD-10-CM | POA: Diagnosis present

## 2017-11-29 DIAGNOSIS — R1110 Vomiting, unspecified: Secondary | ICD-10-CM

## 2017-11-29 DIAGNOSIS — K589 Irritable bowel syndrome without diarrhea: Secondary | ICD-10-CM | POA: Diagnosis present

## 2017-11-29 DIAGNOSIS — Z8673 Personal history of transient ischemic attack (TIA), and cerebral infarction without residual deficits: Secondary | ICD-10-CM

## 2017-11-29 DIAGNOSIS — R791 Abnormal coagulation profile: Secondary | ICD-10-CM | POA: Diagnosis present

## 2017-11-29 DIAGNOSIS — I272 Pulmonary hypertension, unspecified: Secondary | ICD-10-CM | POA: Diagnosis present

## 2017-11-29 DIAGNOSIS — I081 Rheumatic disorders of both mitral and tricuspid valves: Secondary | ICD-10-CM | POA: Diagnosis present

## 2017-11-29 DIAGNOSIS — K219 Gastro-esophageal reflux disease without esophagitis: Secondary | ICD-10-CM | POA: Diagnosis present

## 2017-11-29 DIAGNOSIS — R109 Unspecified abdominal pain: Secondary | ICD-10-CM | POA: Diagnosis present

## 2017-11-29 DIAGNOSIS — Z794 Long term (current) use of insulin: Secondary | ICD-10-CM | POA: Diagnosis not present

## 2017-11-29 DIAGNOSIS — K920 Hematemesis: Secondary | ICD-10-CM | POA: Diagnosis not present

## 2017-11-29 DIAGNOSIS — Z86711 Personal history of pulmonary embolism: Secondary | ICD-10-CM

## 2017-11-29 DIAGNOSIS — I361 Nonrheumatic tricuspid (valve) insufficiency: Secondary | ICD-10-CM | POA: Diagnosis not present

## 2017-11-29 DIAGNOSIS — I69320 Aphasia following cerebral infarction: Secondary | ICD-10-CM | POA: Diagnosis not present

## 2017-11-29 DIAGNOSIS — Z9089 Acquired absence of other organs: Secondary | ICD-10-CM

## 2017-11-29 DIAGNOSIS — I2510 Atherosclerotic heart disease of native coronary artery without angina pectoris: Secondary | ICD-10-CM | POA: Diagnosis present

## 2017-11-29 DIAGNOSIS — Z9841 Cataract extraction status, right eye: Secondary | ICD-10-CM

## 2017-11-29 DIAGNOSIS — Z01810 Encounter for preprocedural cardiovascular examination: Secondary | ICD-10-CM | POA: Diagnosis not present

## 2017-11-29 DIAGNOSIS — I5032 Chronic diastolic (congestive) heart failure: Secondary | ICD-10-CM | POA: Diagnosis not present

## 2017-11-29 DIAGNOSIS — R112 Nausea with vomiting, unspecified: Secondary | ICD-10-CM | POA: Diagnosis not present

## 2017-11-29 DIAGNOSIS — R1111 Vomiting without nausea: Secondary | ICD-10-CM | POA: Diagnosis not present

## 2017-11-29 DIAGNOSIS — Z7901 Long term (current) use of anticoagulants: Secondary | ICD-10-CM

## 2017-11-29 DIAGNOSIS — Z66 Do not resuscitate: Secondary | ICD-10-CM | POA: Diagnosis not present

## 2017-11-29 DIAGNOSIS — Z9049 Acquired absence of other specified parts of digestive tract: Secondary | ICD-10-CM

## 2017-11-29 DIAGNOSIS — Z9842 Cataract extraction status, left eye: Secondary | ICD-10-CM

## 2017-11-29 DIAGNOSIS — Z8249 Family history of ischemic heart disease and other diseases of the circulatory system: Secondary | ICD-10-CM

## 2017-11-29 DIAGNOSIS — K449 Diaphragmatic hernia without obstruction or gangrene: Principal | ICD-10-CM | POA: Diagnosis present

## 2017-11-29 DIAGNOSIS — Z452 Encounter for adjustment and management of vascular access device: Secondary | ICD-10-CM | POA: Diagnosis not present

## 2017-11-29 DIAGNOSIS — Z95828 Presence of other vascular implants and grafts: Secondary | ICD-10-CM

## 2017-11-29 DIAGNOSIS — Z8261 Family history of arthritis: Secondary | ICD-10-CM

## 2017-11-29 DIAGNOSIS — I482 Chronic atrial fibrillation: Secondary | ICD-10-CM | POA: Diagnosis present

## 2017-11-29 DIAGNOSIS — N182 Chronic kidney disease, stage 2 (mild): Secondary | ICD-10-CM | POA: Diagnosis not present

## 2017-11-29 DIAGNOSIS — E1122 Type 2 diabetes mellitus with diabetic chronic kidney disease: Secondary | ICD-10-CM | POA: Diagnosis present

## 2017-11-29 DIAGNOSIS — D751 Secondary polycythemia: Secondary | ICD-10-CM | POA: Diagnosis not present

## 2017-11-29 DIAGNOSIS — R1013 Epigastric pain: Secondary | ICD-10-CM | POA: Diagnosis not present

## 2017-11-29 DIAGNOSIS — E785 Hyperlipidemia, unspecified: Secondary | ICD-10-CM | POA: Diagnosis not present

## 2017-11-29 DIAGNOSIS — K222 Esophageal obstruction: Secondary | ICD-10-CM | POA: Diagnosis not present

## 2017-11-29 DIAGNOSIS — Z833 Family history of diabetes mellitus: Secondary | ICD-10-CM

## 2017-11-29 HISTORY — DX: Chronic kidney disease, stage 2 (mild): N18.2

## 2017-11-29 LAB — URINALYSIS, ROUTINE W REFLEX MICROSCOPIC
BILIRUBIN URINE: NEGATIVE
Glucose, UA: 50 mg/dL — AB
KETONES UR: 20 mg/dL — AB
Leukocytes, UA: NEGATIVE
Nitrite: NEGATIVE
PH: 5 (ref 5.0–8.0)
PROTEIN: 30 mg/dL — AB
Specific Gravity, Urine: 1.017 (ref 1.005–1.030)

## 2017-11-29 LAB — BASIC METABOLIC PANEL
Anion gap: 6 (ref 5–15)
BUN: 13 mg/dL (ref 8–23)
CALCIUM: 8.4 mg/dL — AB (ref 8.9–10.3)
CO2: 24 mmol/L (ref 22–32)
Chloride: 105 mmol/L (ref 98–111)
Creatinine, Ser: 0.76 mg/dL (ref 0.44–1.00)
GFR calc non Af Amer: >60 mL/min (ref >60)
GLUCOSE: 116 mg/dL — AB (ref 70–99)
Potassium: 3.9 mmol/L (ref 3.5–5.1)
Sodium: 135 mmol/L (ref 135–145)

## 2017-11-29 LAB — COMPREHENSIVE METABOLIC PANEL
ALT: 17 U/L (ref 0–44)
ANION GAP: 17 — AB (ref 5–15)
AST: 25 U/L (ref 15–41)
Albumin: 3.8 g/dL (ref 3.5–5.0)
Alkaline Phosphatase: 102 U/L (ref 38–126)
BILIRUBIN TOTAL: 1.6 mg/dL — AB (ref 0.3–1.2)
BUN: 14 mg/dL (ref 8–23)
CHLORIDE: 95 mmol/L — AB (ref 98–111)
CO2: 24 mmol/L (ref 22–32)
Calcium: 9.3 mg/dL (ref 8.9–10.3)
Creatinine, Ser: 0.94 mg/dL (ref 0.44–1.00)
GFR calc Af Amer: >60 mL/min (ref >60)
GFR calc non Af Amer: 52 mL/min — ABNORMAL LOW (ref >60)
GLUCOSE: 149 mg/dL — AB (ref 70–99)
POTASSIUM: 3.3 mmol/L — AB (ref 3.5–5.1)
Sodium: 136 mmol/L (ref 135–145)
TOTAL PROTEIN: 6.9 g/dL (ref 6.5–8.1)

## 2017-11-29 LAB — PROTIME-INR
INR: 2.94
Prothrombin Time: 30.4 seconds — ABNORMAL HIGH (ref 11.4–15.2)

## 2017-11-29 LAB — CBC
HEMATOCRIT: 50.1 % — AB (ref 36.0–46.0)
HEMOGLOBIN: 16.6 g/dL — AB (ref 12.0–15.0)
MCH: 31.3 pg (ref 26.0–34.0)
MCHC: 33.1 g/dL (ref 30.0–36.0)
MCV: 94.4 fL (ref 78.0–100.0)
Platelets: 236 10*3/uL (ref 150–400)
RBC: 5.31 MIL/uL — AB (ref 3.87–5.11)
RDW: 12.5 % (ref 11.5–15.5)
WBC: 9.1 10*3/uL (ref 4.0–10.5)

## 2017-11-29 LAB — LIPASE, BLOOD: LIPASE: 50 U/L (ref 11–51)

## 2017-11-29 LAB — GLUCOSE, CAPILLARY
GLUCOSE-CAPILLARY: 102 mg/dL — AB (ref 70–99)
Glucose-Capillary: 92 mg/dL (ref 70–99)

## 2017-11-29 MED ORDER — PANTOPRAZOLE SODIUM 40 MG PO TBEC: 40.0000 mg | DELAYED_RELEASE_TABLET | Freq: Every day | ORAL | 0 refills | Status: DC

## 2017-11-29 MED ORDER — ONDANSETRON 4 MG PO TBDP: 4.0000 mg | ORAL_TABLET | Freq: Three times a day (TID) | ORAL | 0 refills | Status: DC | PRN

## 2017-11-29 MED ORDER — ONDANSETRON HCL 4 MG/2ML IJ SOLN
4.0000 mg | Freq: Once | INTRAMUSCULAR | Status: AC
  Administered 2017-11-29: 4 mg via INTRAVENOUS
  Filled 2017-11-29: qty 2

## 2017-11-29 MED ORDER — MORPHINE SULFATE (PF) 4 MG/ML IV SOLN
2.0000 mg | Freq: Once | INTRAVENOUS | Status: AC
Start: 2017-11-29 — End: 2017-11-29
  Administered 2017-11-29: 2 mg via INTRAVENOUS
  Filled 2017-11-29: qty 1

## 2017-11-29 MED ORDER — SODIUM CHLORIDE 0.9 % IV BOLUS
500.0000 mL | Freq: Once | INTRAVENOUS | Status: AC
  Administered 2017-11-29: 500 mL via INTRAVENOUS

## 2017-11-29 NOTE — Progress Notes (Signed)
ANTICOAGULATION CONSULT NOTE - Follow Up Consult  Pharmacy Consult for Coumadin Indication: atrial fibrillation  Allergies  Allergen Reactions  . Ace Inhibitors     Cough  . Fosamax [Alendronate Sodium]     Heart burn  . Norvasc [Amlodipine Besylate]     edema  . Zocor [Simvastatin]     Elevated CPK    Patient Measurements: Height: 5' 2.4" (158.5 cm) Weight: 115 lb (52.2 kg) IBW/kg (Calculated) : 51.02  Vital Signs: Temp: 98.7 F (37.1 C) (07/04 0601) Temp Source: Oral (07/04 0601) BP: 164/102 (07/04 0925) Pulse Rate: 100 (07/04 0925)  Labs: Recent Labs    11/26/17 1230 11/27/17 0431 11/28/17 0244 11/29/17 0351  HGB  --  14.4 15.0  --   HCT  --  44.0 46.6*  --   PLT  --  209 216  --   LABPROT  --  26.1* 26.7* 30.4*  INR  --  2.41 2.49 2.94  CREATININE  --   --  0.89 0.76  TROPONINI <0.03  --   --   --     Estimated Creatinine Clearance: 37.6 mL/min (by C-G formula based on SCr of 0.76 mg/dL).   Medications:  Medications Prior to Admission  Medication Sig Dispense Refill Last Dose  . acetaminophen (TYLENOL) 325 MG tablet Take 325-650 mg by mouth every 4 (four) hours as needed for mild pain, moderate pain or headache.    unknown at prn  . Cholecalciferol (VITAMIN D) 2000 UNITS CAPS Take 2,000 Units by mouth daily.    11/25/2017 at Unknown time  . dicyclomine (BENTYL) 20 MG tablet Take 1 tablet 3 x day if needed for nausea, bloating , cramping or diarrhea 90 tablet 1 unknown at prn  . furosemide (LASIX) 20 MG tablet Take 1 tablet daily for BP & fluid retention (Patient taking differently: Take 20 mg by mouth daily. Take 1 tablet daily for BP & fluid retention) 90 tablet 1 11/25/2017 at Unknown time  . losartan (COZAAR) 50 MG tablet TAKE 1 TABLET EVERY MORNING, CHECK BP AT LUNCH-IF BP ABOVE 140/90 TAKE ANOTHER TABLET. 60 tablet 0 11/25/2017 at Unknown time  . Magnesium Hydroxide (MAGNESIA PO) Take 1 tablet by mouth daily.    11/25/2017 at Unknown time  . metoprolol  tartrate (LOPRESSOR) 25 MG tablet Take 0.5 tablets (12.5 mg total) by mouth 2 (two) times daily. 180 tablet 3 11/25/2017 at 0900  . pantoprazole (PROTONIX) 40 MG tablet TAKE 1 TABLET ONCE DAILY FOR REFLUX. 90 tablet 0 11/25/2017 at Unknown time  . potassium chloride (K-DUR) 10 MEQ tablet TAKE 1 TABLET BY MOUTH DAILY. 90 tablet 1 11/25/2017 at Unknown time  . QUEtiapine (SEROQUEL) 25 MG tablet Take 1 tablet at bedtime 90 tablet 1 11/24/2017  . ranitidine (ZANTAC) 300 MG tablet Take 1 tablet (300 mg total) by mouth 2 (two) times daily. 180 tablet 1 11/25/2017 at Unknown time  . warfarin (COUMADIN) 2 MG tablet TAKE AS DIRECTED BY ANTICOAGULATION CLINIC. (Patient taking differently: take 2 mg tablet daily) 30 tablet 2 11/25/2017 at 0900    Assessment: 84 yo F on Coumadin PTA for afib.  INR trending up quickly on home dose in the setting of N/V and decreased PO intake.  EGD found hiatal hernia and surgery consulted for possible repair.  Awaiting MD and patient decision.   Goal of Therapy:  INR 2-3 Monitor platelets by anticoagulation protocol: Yes   Plan:  Given rise in INR will hold Coumadin tonight. Need to clarify  surgical plans in the event that further Coumadin doses need to be held. INR in AM  Toys 'R' UsKimberly Chais Fehringer, Pharm.D., BCPS Clinical Pharmacist Pager: [phone] Clinical phone for 11/29/2017 from 8:30-4:00 is [phone].  **Pharmacist phone directory can now be found on [email or web address] ([email or web address]).  [facility] under [facility].  11/29/2017 10:26 AM

## 2017-11-29 NOTE — Progress Notes (Signed)
No complaints today.  EGD showed proximal esophageal web which was mild and able to be traversed.  Stomach shows hiatal hernia but no ulcer or signs of ischemia.  I would recommend that if she can eat and have no further complaints of pain she could go home.  If she continues to have problems however she and her family will have to consider repair of hiatal hernia.  Nothing further to add from a GI standpoint.  Will sign off.  Call us if needed.

## 2017-11-29 NOTE — Progress Notes (Signed)
1 Day Post-Op   Subjective/Chief Complaint: abdominal pain  Had abdominal pain but no nausea overnight    Objective: Vital signs in last 24 hours: Temp:  [97.5 F (36.4 C)-98.7 F (37.1 C)] 98.7 F (37.1 C) (07/04 0601) Pulse Rate:  [59-75] 65 (07/04 0601) Resp:  [15-18] 16 (07/04 0601) BP: (101-208)/(41-112) 151/79 (07/04 0601) SpO2:  [92 %-99 %] 94 % (07/04 0601) Last BM Date: 11/27/17  Intake/Output from previous day: 07/03 0701 - 07/04 0700 In: 100 [I.V.:100] Out: -  Intake/Output this shift: No intake/output data recorded.  General appearance: alert and cooperative Resp: clear to auscultation bilaterally GI: mild TTP epigastrium no peritonitis   Lab Results:  Recent Labs    11/27/17 0431 11/28/17 0244  WBC 12.2* 10.1  HGB 14.4 15.0  HCT 44.0 46.6*  PLT 209 216   BMET Recent Labs    11/28/17 0244 11/29/17 0351  NA 136 135  K 3.4* 3.9  CL 102 105  CO2 26 24  GLUCOSE 122* 116*  BUN 16 13  CREATININE 0.89 0.76  CALCIUM 8.9 8.4*   PT/INR Recent Labs    11/28/17 0244 11/29/17 0351  LABPROT 26.7* 30.4*  INR 2.49 2.94   ABG No results for input(s): PHART, HCO3 in the last 72 hours.  Invalid input(s): PCO2, PO2  Studies/Results: No results found.  Anti-infectives: Anti-infectives (From admission, onward)   None      Assessment/Plan:  DM HTN Afib - on coumadin (INR 2.49 today) H/o CVA Code status DNR  Intractable n/v, abd pain - unsure if 2/2 Large hiatal hernia or Proximal esophageal stricture. No signs of volvulus or intestinal ischemia EGD showed  web without significant obstruction / PEH without obstruction or ischemia  ID - none FEN - CLD VTE - SCDs, coumadin (last dose 7/2, INR 3  Today) HOLD COUMADIN Foley - none  OK TO FEED for now  Option of ex lap G tube/pexy only viable option at her age  Discussed with the patient but not yet sure of what she wants at this point     LOS: 2 days    Maisie Fushomas A Josehua Hammar 11/29/2017

## 2017-11-29 NOTE — ED Triage Notes (Signed)
Pt recently discharged. Pt complains of abdominal pain, denies N/V. Pt reports the pain has worsened since.

## 2017-11-29 NOTE — Discharge Summary (Signed)
Physician Discharge Summary   Patient ID: Lisa Hopkins MRN: [id number] DOB/AGE: 1924/09/21 84 y.o.  Admit date: 11/25/2017 Discharge date: 11/29/2017  Primary Care Physician:  Lucky Cowboy, MD   Recommendations for Outpatient Follow-up:  1. Follow up with PCP in 1-2 weeks 2. Patient recommended full liquid diet or very soft solids.  3. Discussed with patient's son, if she has trouble eating they will pursue hiatal hernia repair surgery and will follow-up with surgery outpatient.  Home Health: None Equipment/Devices: none   Discharge Condition: stable CODE STATUS: DNR   Diet recommendation: Full liquid or soft solids   Discharge Diagnoses:   Intractable nausea and vomiting, abdominal pain secondary to large hiatal hernia . Type 2 diabetes mellitus with diabetic neuropathy, without long-term current use of insulin (HCC) . Essential hypertension . CKD stage 2 due to type 2 diabetes mellitus (HCC) . Atrial fibrillation [facility])    Consults: Gastroenterology, Dr Evette Cristal General surgery    Allergies:   Allergies  Allergen Reactions  . Ace Inhibitors     Cough  . Fosamax [Alendronate Sodium]     Heart burn  . Norvasc [Amlodipine Besylate]     edema  . Zocor [Simvastatin]     Elevated CPK     DISCHARGE MEDICATIONS: Allergies as of 11/29/2017      Reactions   Ace Inhibitors    Cough   Fosamax [alendronate Sodium]    Heart burn   Norvasc [amlodipine Besylate]    edema   Zocor [simvastatin]    Elevated CPK      Medication List    STOP taking these medications   acetaminophen 325 MG tablet Commonly known as:  TYLENOL   furosemide 20 MG tablet Commonly known as:  LASIX   potassium chloride 10 MEQ tablet Commonly known as:  K-DUR     TAKE these medications   dicyclomine 20 MG tablet Commonly known as:  BENTYL Take 1 tablet 3 x day if needed for nausea, bloating , cramping or diarrhea   losartan 50 MG tablet Commonly known as:  COZAAR TAKE 1  TABLET EVERY MORNING, CHECK BP AT LUNCH-IF BP ABOVE 140/90 TAKE ANOTHER TABLET.   MAGNESIA PO Take 1 tablet by mouth daily.   metoprolol tartrate 25 MG tablet Commonly known as:  LOPRESSOR Take 0.5 tablets (12.5 mg total) by mouth 2 (two) times daily.   ondansetron 4 MG disintegrating tablet Commonly known as:  ZOFRAN ODT Take 1 tablet (4 mg total) by mouth every 8 (eight) hours as needed for nausea or vomiting.   pantoprazole 40 MG tablet Commonly known as:  PROTONIX Take 1 tablet (40 mg total) by mouth daily. What changed:  See the new instructions.   QUEtiapine 25 MG tablet Commonly known as:  SEROQUEL Take 1 tablet at bedtime   ranitidine 300 MG tablet Commonly known as:  ZANTAC Take 1 tablet (300 mg total) by mouth 2 (two) times daily.   Vitamin D 2000 units Caps Take 2,000 Units by mouth daily.   warfarin 2 MG tablet Commonly known as:  COUMADIN Take as directed. If you are unsure how to take this medication, talk to your nurse or doctor. Original instructions:  TAKE AS DIRECTED BY ANTICOAGULATION CLINIC. What changed:  See the new instructions.        Brief H and P: For complete details please refer to admission H and P, but in brief Lisa Hopkins a 84 y.o.femalewithhistory of atrial fibrillation, embolic stroke, diabetes mellitus type 2,  hypertension was brought to the ER after patient was having persistent nausea vomiting. Patient symptoms started 2 days prior to admission after patient had dinner at her living facility. She had come to the ER same night and had CT abdomen and pelvis which was unremarkable and was discharged home after being treated symptomatically. Patient also was given Carafate. After reaching her facility patient son states that she had benign persistent vomiting unable to keep in anything and was brought to the ER again  Hospital Course:   Intractable vomiting with nausea -CT abdomen showed a paraesophageal hernia with  displacement of GE junction -GI and general surgery was consulted.  Patient was placed on IV famotidine. -EGD showed large hiatal hernia, benign-appearing esophageal stenosis otherwise normal -Patient has been tolerating full liquid diet, soft solids.  Per GI, if patient can eat and has no further complaints she could be discharged and follow-up outpatient with general surgery regarding consideration for repair of hiatal hernia. -Per general surgery, Dr. Luisa Hart, option of ex lap G-tube/pexy only viable option at her age.  Patient is not sure of what she wants at this point.  Discussed with patient's son, who would like to take at home and will follow up outpatient with general surgery if patient and family wants to pursue the hernia repair surgery.     Essential hypertension -Continue metoprolol, Cozaar     Atrial fibrillation (HCC) -Currently rate controlled, continue metoprolol Continue Coumadin    History of CVA (cerebrovascular accident) -Continue Coumadin, INR therapeutic 2.9    Type 2 diabetes mellitus with diabetic neuropathy, without long-term current use of insulin (HCC) -Last hemoglobin A1c 6.2 -Diet controlled     CKD stage 2 due to type 2 diabetes mellitus (HCC) -Creatinine at baseline, 0.7, no acute issues  Hypokalemia -Was replaced    Day of Discharge S: No acute issues, tolerating full liquid diet without any difficulty.  Hoping to go home.  BP (!) 164/102   Pulse 100   Temp 98.7 F (37.1 C) (Oral)   Resp 16   Ht 5' 2.4" (1.585 m)   Wt 52.2 kg (115 lb)   SpO2 94%   BMI 20.76 kg/m   Physical Exam: General: Alert and awake oriented x3 not in any acute distress. HEENT: anicteric sclera, pupils reactive to light and accommodation CVS: S1-S2 clear no murmur rubs or gallops Chest: clear to auscultation bilaterally, no wheezing rales or rhonchi Abdomen: soft nontender, nondistended, normal bowel sounds Extremities: no cyanosis, clubbing or edema noted  bilaterally Neuro: no new deficits.   The results of significant diagnostics from this hospitalization (including imaging, microbiology, ancillary and laboratory) are listed below for reference.      Procedures/Studies:  Ct Abdomen Pelvis W Contrast  Result Date: 11/24/2017 CLINICAL DATA:  Epigastric pain after dinner. EXAM: CT ABDOMEN AND PELVIS WITH CONTRAST TECHNIQUE: Multidetector CT imaging of the abdomen and pelvis was performed using the standard protocol following bolus administration of intravenous contrast. CONTRAST:  OMNIPAQUE IOHEXOL 300 MG/ML  SOLN COMPARISON:  CT 06/08/2014, CXR 04/02/2015 FINDINGS: Lower chest: Redemonstration of large hiatal hernia containing much of the stomach. Heart size is borderline enlarged but stable. There is atelectasis at each base. Hepatobiliary: Hepatic steatosis. Cholecystectomy. No space-occupying mass of the liver. Biliary dilatation compatible with reservoir effect status post cholecystectomy. No choledocholithiasis. Pancreas: Pancreatic atrophy. Ectasia of the pancreatic duct without pancreatic mass or inflammation. Spleen: Normal Adrenals/Urinary Tract: Normal bilateral adrenal glands. Symmetric cortical enhancement of both kidneys with mild cortical  scarring along lateral aspect of both kidneys. No nephrolithiasis nor hydroureteronephrosis. The urinary bladder is unremarkable for the degree of distention. Stomach/Bowel: Normal small bowel rotation without obstruction. Moderate stool retention within the colon with scattered colonic diverticulosis. No acute diverticulitis. Appendix not well visualized but no pericecal inflammatory change noted. Vascular/Lymphatic: Mild mild aortoiliac and branch vessel atherosclerosis without aneurysm. No adenopathy. Reproductive: Calcified uterine fibroids.  No adnexal mass. Other: No free air or free fluid. Musculoskeletal: No worrisome lytic or sclerotic lesions. Degenerative change noted of the dorsal spine SI  joints and pubic symphysis. IMPRESSION: 1. Repeat demonstration of large hiatal hernia. No acute bowel obstruction or inflammation. Scattered colonic diverticulosis without acute diverticulitis. 2. Mild hepatic steatosis. Reservoir effect status post cholecystectomy accounting for intra and extrahepatic ductal dilatation. 3. Calcified uterine fibroids.  Adnexal masses Electronically Signed   By: Tollie Ethavid  Kwon M.D.   On: 11/24/2017 21:35   Dg Abd Acute W/chest  Result Date: 11/25/2017 CLINICAL DATA:  84 y/o  F; upper abdominal pain, nausea, vomiting. EXAM: DG ABDOMEN ACUTE W/ 1V CHEST COMPARISON:  11/24/2017 CT of abdomen and pelvis. FINDINGS: Large hiatal hernia obscuring the left lung base. Visualized lung fields are clear. Aortic atherosclerosis. Cardiac silhouette obscured by left lung base hiatal hernia. Right upper quadrant surgical clips. Normal bowel gas pattern. Multilevel degenerative changes of the spine, no acute osseous abnormality is evident. Calcified uterine myoma. IMPRESSION: Large hiatal hernia obscuring left lung base. No acute pulmonary process identified. Normal abdominal and pelvic bowel gas pattern. Electronically Signed   By: Mitzi HansenLance  Furusawa-Stratton M.D.   On: 11/25/2017 20:48      LAB RESULTS: Basic Metabolic Panel: Recent Labs  Lab 11/28/17 0244 11/29/17 0351  NA 136 135  K 3.4* 3.9  CL 102 105  CO2 26 24  GLUCOSE 122* 116*  BUN 16 13  CREATININE 0.89 0.76  CALCIUM 8.9 8.4*   Liver Function Tests: Recent Labs  Lab 11/25/17 1457 11/26/17 0631  AST 23 21  ALT 17 14  ALKPHOS 121 81  BILITOT 1.3* 1.0  PROT 7.7 5.8*  ALBUMIN 4.0 3.1*   Recent Labs  Lab 11/25/17 1457 11/26/17 0631  LIPASE 36 37   No results for input(s): AMMONIA in the last 168 hours. CBC: Recent Labs  Lab 11/27/17 0431 11/28/17 0244  WBC 12.2* 10.1  HGB 14.4 15.0  HCT 44.0 46.6*  MCV 94.6 94.7  PLT 209 216   Cardiac Enzymes: Recent Labs  Lab 11/26/17 0631 11/26/17 1230   TROPONINI <0.03 <0.03   BNP: Invalid input(s): POCBNP CBG: Recent Labs  Lab 11/29/17 0802 11/29/17 1208  GLUCAP 92 102*      Disposition and Follow-up: Discharge Instructions    Discharge instructions   Complete by:  As directed    Full liquid or very soft diet.  Please follow with surgery outpatient if she wants to proceed to surgery for hiatal hernia.  Please sit upright when you eat, small bites and chew well.   Increase activity slowly   Complete by:  As directed        DISPOSITION: Home   DISCHARGE FOLLOW-UP Follow-up Information    Graylin ShiverGanem, Salem F, MD. Schedule an appointment as soon as possible for a visit in 2 week(s).   Specialty:  Gastroenterology Contact information: [address] [place] [address] [phone]        Harriette Bouillonornett, Thomas, MD. Schedule an appointment as soon as possible for a visit in 2 week(s).  Specialty:  General Surgery Why:  if she wishes to pursue hiatal hernia surgery Contact information: [address] [place] [address] [phone]            Time coordinating discharge:  35 minutes  Signed:   Thad Ranger M.D. [facility] 11/29/2017, 1:01 PM Pager: [phone]

## 2017-11-29 NOTE — ED Provider Notes (Signed)
[facility] Provider Note   CSN: [id number] Arrival date & time: 11/29/17  2235     History   Chief Complaint Chief Complaint  Patient presents with  . Abdominal Pain    HPI Lisa Hopkins is a 84 y.o. female.  Patient is a 84 year old female with past medical history of diabetes, hypertension, prior CVA, and large hiatal hernia.  She was just discharged earlier today after being admitted for abdominal pain.  The pain was assumed to be related to her hiatal hernia.  She had an endoscopy performed during her hospitalization that showed no other pathology.  She had surgical consultation and the possibility of a laparoscopic repair of this hiatal hernia was discussed.  The plan was to treat with proton pump inhibitors and see how she responds.  Since returning home, her pain has returned and she presents for evaluation of this.  She denies nausea or vomiting.  She denies diarrhea or constipation.  She denies fevers or chills.     Past Medical History:  Diagnosis Date  . Anxiety   . Aphasia as late effect of cerebrovascular accident   . Atrial fibrillation (HCC)   . CVA (cerebral infarction)   . Diabetes mellitus type II   . GERD (gastroesophageal reflux disease)   . Hiatal hernia   . HTN (hypertension)   . Hyperlipidemia   . IBS (irritable bowel syndrome)   . Stroke (HCC)   . Vitamin D deficiency     Patient Active Problem List   Diagnosis Date Noted  . N&V (nausea and vomiting) 11/27/2017  . Nausea & vomiting 11/26/2017  . Intractable vomiting with nausea 11/25/2017  . CKD stage 2 due to type 2 diabetes mellitus (HCC) 08/02/2017  . Type 2 diabetes mellitus with diabetic neuropathy, without long-term current use of insulin (HCC) 12/25/2016  . Atherosclerosis of aorta (HCC) 12/03/2015  . GERD 09/25/2014  . Diastolic CHF (HCC) 08/04/2014  . Bursitis of hip 03/03/2014  . Medication management 07/01/2013  . Hyperlipidemia, mixed     . Vitamin D deficiency   . T2_NIDDM w/ CKD2 (GFR 64 ml/min) 03/15/2010  . Anxiety state 03/15/2010  . Essential hypertension 03/15/2010  . Atrial fibrillation (HCC) 03/15/2010  . APHASIA DUE TO CEREBROVASCULAR DISEASE 03/15/2010  . History of CVA (cerebrovascular accident) 03/07/2010    Past Surgical History:  Procedure Laterality Date  . CATARACT EXTRACTION, BILATERAL    . CHOLECYSTECTOMY    . COLONOSCOPY  2005  . TONSILLECTOMY    . UPPER GASTROINTESTINAL ENDOSCOPY  2005     OB History   None      Home Medications    Prior to Admission medications   Medication Sig Start Date End Date Taking? Authorizing Provider  Cholecalciferol (VITAMIN D) 2000 UNITS CAPS Take 2,000 Units by mouth daily.     [provider]  dicyclomine (BENTYL) 20 MG tablet Take 1 tablet 3 x day if needed for nausea, bloating , cramping or diarrhea 06/24/15   Lucky CowboyMcKeown, William, MD  losartan (COZAAR) 50 MG tablet TAKE 1 TABLET EVERY MORNING, CHECK BP AT LUNCH-IF BP ABOVE 140/90 TAKE ANOTHER TABLET. 11/14/17   Judd Gaudierorbett, Ashley, NP  Magnesium Hydroxide (MAGNESIA PO) Take 1 tablet by mouth daily.     [provider]  metoprolol tartrate (LOPRESSOR) 25 MG tablet Take 0.5 tablets (12.5 mg total) by mouth 2 (two) times daily. 07/16/17 11/26/17  Lucky CowboyMcKeown, William, MD  ondansetron (ZOFRAN ODT) 4 MG disintegrating tablet Take 1  tablet (4 mg total) by mouth every 8 (eight) hours as needed for nausea or vomiting. 11/29/17   Rai, Ripudeep K, MD  pantoprazole (PROTONIX) 40 MG tablet Take 1 tablet (40 mg total) by mouth daily. 11/29/17   Rai, Delene Ruffini, MD  QUEtiapine (SEROQUEL) 25 MG tablet Take 1 tablet at bedtime 10/03/17 09/24/18  Lucky Cowboy, MD  ranitidine (ZANTAC) 300 MG tablet Take 1 tablet (300 mg total) by mouth 2 (two) times daily. 07/27/17   Lucky Cowboy, MD  warfarin (COUMADIN) 2 MG tablet TAKE AS DIRECTED BY ANTICOAGULATION CLINIC. Patient taking differently: take 2 mg tablet daily 09/10/17    Lars Masson, MD    Family History Family History  Problem Relation Age of Onset  . Diabetes Mother   . Stroke Mother   . Hypertension Brother   . Heart disease Brother   . Arthritis Sister        Rhematoid  . Arthritis Sister        Rhematoid    Social History Social History   Tobacco Use  . Smoking status: Never Smoker  . Smokeless tobacco: Never Used  Substance Use Topics  . Alcohol use: No  . Drug use: No     Allergies   Ace inhibitors; Fosamax [alendronate sodium]; Norvasc [amlodipine besylate]; and Zocor [simvastatin]   Review of Systems Review of Systems  All other systems reviewed and are negative.    Physical Exam Updated Vital Signs BP (!) 176/115 (BP Location: Left Arm)   Pulse 89   Temp 98.9 F (37.2 C) (Oral)   Resp 18   SpO2 99%   Physical Exam  Constitutional: She is oriented to person, place, and time. She appears well-developed and well-nourished. No distress.  HENT:  Head: Normocephalic and atraumatic.  Neck: Normal range of motion. Neck supple.  Cardiovascular: Normal rate and regular rhythm. Exam reveals no gallop and no friction rub.  No murmur heard. Pulmonary/Chest: Effort normal and breath sounds normal. No respiratory distress. She has no wheezes.  Abdominal: Soft. Bowel sounds are normal. She exhibits no distension. There is tenderness in the epigastric area and periumbilical area. There is no rigidity, no rebound and no guarding.  Musculoskeletal: Normal range of motion.  Neurological: She is alert and oriented to person, place, and time.  Skin: Skin is warm and dry. She is not diaphoretic.  Nursing note and vitals reviewed.    ED Treatments / Results  Labs (all labs ordered are listed, but only abnormal results are displayed) Labs Reviewed  URINALYSIS, ROUTINE W REFLEX MICROSCOPIC - Abnormal; Notable for the following components:      Result Value   APPearance HAZY (*)    Glucose, UA 50 (*)    Hgb urine dipstick  MODERATE (*)    Ketones, ur 20 (*)    Protein, ur 30 (*)    Bacteria, UA RARE (*)    All other components within normal limits  CBC  COMPREHENSIVE METABOLIC PANEL  LIPASE, BLOOD    EKG None  Radiology No results found.  Procedures Procedures (including critical care time)  Medications Ordered in ED Medications  sodium chloride 0.9 % bolus 500 mL (has no administration in time range)  morphine 4 MG/ML injection 2 mg (has no administration in time range)     Initial Impression / Assessment and Plan / ED Course  I have reviewed the triage vital signs and the nursing notes.  Pertinent labs & imaging results that were available during my  care of the patient were reviewed by me and considered in my medical decision making (see chart for details).  Patient with recent admission for abdominal pain thought to be related to her hiatal hernia.  She was discharged this afternoon, now returns with worsening pain.  She is tender in the periumbilical and epigastric region.  Laboratory studies are unchanged from prior studies.  There is no white count and LFTs are baseline.  She was given IV fluids and morphine with little relief.  Patient will be readmitted to the hospitalist service under the care of Dr. Antionette Char for pain control and further work-up.  Final Clinical Impressions(s) / ED Diagnoses   Final diagnoses:  None    ED Discharge Orders    None       Geoffery Lyons, MD 11/30/17 [phone]

## 2017-11-29 NOTE — Progress Notes (Signed)
Pati GalloFrances H Petteway to be D/C'd Home per MD order.  Discussed with the patient and all questions fully answered.  VSS, Skin clean, dry and intact without evidence of skin break down, no evidence of skin tears noted. IV catheter discontinued intact. Site without signs and symptoms of complications. Dressing and pressure applied.  An After Visit Summary was printed and given to the patient. Patient received prescription.  D/c education completed with patient/family including follow up instructions, medication list, d/c activities limitations if indicated, with other d/c instructions as indicated by MD - patient able to verbalize understanding, all questions fully answered.   Patient instructed to return to ED, call 911, or call MD for any changes in condition.   Patient escorted via WC, and D/C home via private auto.  Caren HazyKamila A Aariana Shankland 11/29/2017 2:33 PM

## 2017-11-30 ENCOUNTER — Encounter (HOSPITAL_COMMUNITY): Payer: Self-pay | Admitting: Family Medicine

## 2017-11-30 ENCOUNTER — Observation Stay (HOSPITAL_COMMUNITY): Payer: Medicare Other

## 2017-11-30 ENCOUNTER — Inpatient Hospital Stay (HOSPITAL_COMMUNITY): Payer: Medicare Other

## 2017-11-30 ENCOUNTER — Other Ambulatory Visit: Payer: Self-pay | Admitting: Adult Health

## 2017-11-30 DIAGNOSIS — Z8261 Family history of arthritis: Secondary | ICD-10-CM | POA: Diagnosis not present

## 2017-11-30 DIAGNOSIS — R112 Nausea with vomiting, unspecified: Secondary | ICD-10-CM | POA: Diagnosis not present

## 2017-11-30 DIAGNOSIS — I272 Pulmonary hypertension, unspecified: Secondary | ICD-10-CM | POA: Diagnosis present

## 2017-11-30 DIAGNOSIS — I10 Essential (primary) hypertension: Secondary | ICD-10-CM

## 2017-11-30 DIAGNOSIS — R109 Unspecified abdominal pain: Secondary | ICD-10-CM | POA: Diagnosis not present

## 2017-11-30 DIAGNOSIS — R1111 Vomiting without nausea: Secondary | ICD-10-CM | POA: Diagnosis not present

## 2017-11-30 DIAGNOSIS — K920 Hematemesis: Secondary | ICD-10-CM | POA: Diagnosis not present

## 2017-11-30 DIAGNOSIS — I081 Rheumatic disorders of both mitral and tricuspid valves: Secondary | ICD-10-CM | POA: Diagnosis present

## 2017-11-30 DIAGNOSIS — R791 Abnormal coagulation profile: Secondary | ICD-10-CM | POA: Diagnosis present

## 2017-11-30 DIAGNOSIS — D751 Secondary polycythemia: Secondary | ICD-10-CM | POA: Diagnosis present

## 2017-11-30 DIAGNOSIS — Z823 Family history of stroke: Secondary | ICD-10-CM | POA: Diagnosis not present

## 2017-11-30 DIAGNOSIS — I482 Chronic atrial fibrillation: Secondary | ICD-10-CM | POA: Diagnosis not present

## 2017-11-30 DIAGNOSIS — Z8249 Family history of ischemic heart disease and other diseases of the circulatory system: Secondary | ICD-10-CM | POA: Diagnosis not present

## 2017-11-30 DIAGNOSIS — E785 Hyperlipidemia, unspecified: Secondary | ICD-10-CM | POA: Diagnosis present

## 2017-11-30 DIAGNOSIS — I5032 Chronic diastolic (congestive) heart failure: Secondary | ICD-10-CM | POA: Diagnosis present

## 2017-11-30 DIAGNOSIS — N182 Chronic kidney disease, stage 2 (mild): Secondary | ICD-10-CM

## 2017-11-30 DIAGNOSIS — K589 Irritable bowel syndrome without diarrhea: Secondary | ICD-10-CM | POA: Diagnosis present

## 2017-11-30 DIAGNOSIS — Z7901 Long term (current) use of anticoagulants: Secondary | ICD-10-CM

## 2017-11-30 DIAGNOSIS — Q394 Esophageal web: Secondary | ICD-10-CM | POA: Diagnosis not present

## 2017-11-30 DIAGNOSIS — I361 Nonrheumatic tricuspid (valve) insufficiency: Secondary | ICD-10-CM | POA: Diagnosis not present

## 2017-11-30 DIAGNOSIS — Z01810 Encounter for preprocedural cardiovascular examination: Secondary | ICD-10-CM

## 2017-11-30 DIAGNOSIS — E876 Hypokalemia: Secondary | ICD-10-CM | POA: Diagnosis present

## 2017-11-30 DIAGNOSIS — Z66 Do not resuscitate: Secondary | ICD-10-CM | POA: Diagnosis not present

## 2017-11-30 DIAGNOSIS — K449 Diaphragmatic hernia without obstruction or gangrene: Secondary | ICD-10-CM | POA: Diagnosis not present

## 2017-11-30 DIAGNOSIS — E1122 Type 2 diabetes mellitus with diabetic chronic kidney disease: Secondary | ICD-10-CM | POA: Diagnosis not present

## 2017-11-30 DIAGNOSIS — Z8673 Personal history of transient ischemic attack (TIA), and cerebral infarction without residual deficits: Secondary | ICD-10-CM

## 2017-11-30 DIAGNOSIS — F0390 Unspecified dementia without behavioral disturbance: Secondary | ICD-10-CM | POA: Diagnosis present

## 2017-11-30 DIAGNOSIS — I2510 Atherosclerotic heart disease of native coronary artery without angina pectoris: Secondary | ICD-10-CM | POA: Diagnosis present

## 2017-11-30 DIAGNOSIS — Z452 Encounter for adjustment and management of vascular access device: Secondary | ICD-10-CM | POA: Diagnosis not present

## 2017-11-30 DIAGNOSIS — I130 Hypertensive heart and chronic kidney disease with heart failure and stage 1 through stage 4 chronic kidney disease, or unspecified chronic kidney disease: Secondary | ICD-10-CM | POA: Diagnosis not present

## 2017-11-30 DIAGNOSIS — I69320 Aphasia following cerebral infarction: Secondary | ICD-10-CM | POA: Diagnosis not present

## 2017-11-30 DIAGNOSIS — K219 Gastro-esophageal reflux disease without esophagitis: Secondary | ICD-10-CM | POA: Diagnosis not present

## 2017-11-30 DIAGNOSIS — Z794 Long term (current) use of insulin: Secondary | ICD-10-CM | POA: Diagnosis not present

## 2017-11-30 LAB — COMPREHENSIVE METABOLIC PANEL
ALK PHOS: 98 U/L (ref 38–126)
ALT: 16 U/L (ref 0–44)
ANION GAP: 17 — AB (ref 5–15)
AST: 22 U/L (ref 15–41)
Albumin: 3.6 g/dL (ref 3.5–5.0)
BILIRUBIN TOTAL: 1.5 mg/dL — AB (ref 0.3–1.2)
BUN: 14 mg/dL (ref 8–23)
CALCIUM: 9.1 mg/dL (ref 8.9–10.3)
CO2: 22 mmol/L (ref 22–32)
Chloride: 98 mmol/L (ref 98–111)
Creatinine, Ser: 0.89 mg/dL (ref 0.44–1.00)
GFR calc Af Amer: >60 mL/min (ref >60)
GFR calc non Af Amer: 55 mL/min — ABNORMAL LOW (ref >60)
GLUCOSE: 156 mg/dL — AB (ref 70–99)
Potassium: 3.2 mmol/L — ABNORMAL LOW (ref 3.5–5.1)
Sodium: 137 mmol/L (ref 135–145)
TOTAL PROTEIN: 6.8 g/dL (ref 6.5–8.1)

## 2017-11-30 LAB — GLUCOSE, CAPILLARY: Glucose-Capillary: 153 mg/dL — ABNORMAL HIGH (ref 70–99)

## 2017-11-30 LAB — MAGNESIUM: Magnesium: 1.8 mg/dL (ref 1.7–2.4)

## 2017-11-30 LAB — PROTIME-INR
INR: 2.85
Prothrombin Time: 29.7 seconds — ABNORMAL HIGH (ref 11.4–15.2)

## 2017-11-30 MED ORDER — SODIUM CHLORIDE 0.9 % IV SOLN
8.0000 mg/h | INTRAVENOUS | Status: DC
  Administered 2017-11-30 – 2017-12-02 (×5): 8 mg/h via INTRAVENOUS
  Filled 2017-11-30 (×7): qty 80

## 2017-11-30 MED ORDER — PANTOPRAZOLE SODIUM 40 MG PO TBEC: 40.0000 mg | DELAYED_RELEASE_TABLET | Freq: Every day | ORAL | Status: DC

## 2017-11-30 MED ORDER — ONDANSETRON HCL 4 MG/2ML IJ SOLN
4.0000 mg | Freq: Four times a day (QID) | INTRAMUSCULAR | Status: DC | PRN
  Administered 2017-11-30 – 2017-12-06 (×4): 4 mg via INTRAVENOUS
  Filled 2017-11-30 (×4): qty 2

## 2017-11-30 MED ORDER — POTASSIUM CHLORIDE IN NACL 40-0.9 MEQ/L-% IV SOLN
INTRAVENOUS | Status: DC
  Administered 2017-11-30: 75 mL/h via INTRAVENOUS
  Filled 2017-11-30 (×2): qty 1000

## 2017-11-30 MED ORDER — VITAMIN D 1000 UNITS PO TABS
2000.0000 [IU] | ORAL_TABLET | Freq: Every day | ORAL | Status: DC
  Administered 2017-12-02 – 2017-12-13 (×11): 2000 [IU] via ORAL
  Filled 2017-11-30 (×12): qty 2

## 2017-11-30 MED ORDER — QUETIAPINE FUMARATE 25 MG PO TABS
25.0000 mg | ORAL_TABLET | Freq: Every day | ORAL | Status: DC
  Filled 2017-11-30 (×2): qty 1

## 2017-11-30 MED ORDER — LOSARTAN POTASSIUM 50 MG PO TABS: 50.0000 mg | ORAL_TABLET | Freq: Every day | ORAL | Status: DC

## 2017-11-30 MED ORDER — HYDROMORPHONE HCL 1 MG/ML PO LIQD: 0.5000 mg | Freq: Once | ORAL | Status: DC

## 2017-11-30 MED ORDER — FAMOTIDINE IN NACL 20-0.9 MG/50ML-% IV SOLN
20.0000 mg | Freq: Every day | INTRAVENOUS | Status: DC
  Administered 2017-11-30: 20 mg via INTRAVENOUS
  Filled 2017-11-30: qty 50

## 2017-11-30 MED ORDER — SODIUM CHLORIDE 0.9 % IV SOLN
INTRAVENOUS | Status: DC
  Administered 2017-11-30: 09:00:00 via INTRAVENOUS

## 2017-11-30 MED ORDER — SODIUM CHLORIDE 0.9 % IV SOLN
80.0000 mg | Freq: Once | INTRAVENOUS | Status: AC
  Administered 2017-11-30: 80 mg via INTRAVENOUS
  Filled 2017-11-30: qty 80

## 2017-11-30 MED ORDER — BISACODYL 5 MG PO TBEC: 5.0000 mg | DELAYED_RELEASE_TABLET | Freq: Every day | ORAL | Status: DC | PRN

## 2017-11-30 MED ORDER — ACETAMINOPHEN 650 MG RE SUPP: 650.0000 mg | Freq: Four times a day (QID) | RECTAL | Status: DC | PRN

## 2017-11-30 MED ORDER — SENNOSIDES-DOCUSATE SODIUM 8.6-50 MG PO TABS: 1.0000 | ORAL_TABLET | Freq: Every evening | ORAL | Status: DC | PRN

## 2017-11-30 MED ORDER — METOPROLOL TARTRATE 25 MG PO TABS: 12.5000 mg | ORAL_TABLET | Freq: Two times a day (BID) | ORAL | Status: DC

## 2017-11-30 MED ORDER — POTASSIUM CHLORIDE 10 MEQ/100ML IV SOLN
10.0000 meq | INTRAVENOUS | Status: AC
  Administered 2017-11-30 (×3): 10 meq via INTRAVENOUS
  Filled 2017-11-30 (×3): qty 100

## 2017-11-30 MED ORDER — SODIUM CHLORIDE 0.9 % IV BOLUS
500.0000 mL | Freq: Once | INTRAVENOUS | Status: AC
  Administered 2017-11-30: 500 mL via INTRAVENOUS

## 2017-11-30 MED ORDER — HEPARIN SODIUM (PORCINE) 5000 UNIT/ML IJ SOLN: 5000.0000 [IU] | Freq: Three times a day (TID) | INTRAMUSCULAR | Status: DC

## 2017-11-30 MED ORDER — PROMETHAZINE HCL 25 MG/ML IJ SOLN
12.5000 mg | Freq: Once | INTRAMUSCULAR | Status: AC
  Administered 2017-11-30: 12.5 mg via INTRAVENOUS
  Filled 2017-11-30: qty 1

## 2017-11-30 MED ORDER — DICYCLOMINE HCL 20 MG PO TABS: 20.0000 mg | ORAL_TABLET | Freq: Three times a day (TID) | ORAL | Status: DC | PRN

## 2017-11-30 MED ORDER — PANTOPRAZOLE SODIUM 40 MG IV SOLR: 40.0000 mg | Freq: Two times a day (BID) | INTRAVENOUS | Status: DC

## 2017-11-30 MED ORDER — HYDROMORPHONE HCL 1 MG/ML IJ SOLN: 0.5000 mg | Freq: Once | INTRAMUSCULAR | Status: DC

## 2017-11-30 MED ORDER — ONDANSETRON HCL 4 MG PO TABS: 4.0000 mg | ORAL_TABLET | Freq: Four times a day (QID) | ORAL | Status: DC | PRN

## 2017-11-30 MED ORDER — KCL IN DEXTROSE-NACL 20-5-0.9 MEQ/L-%-% IV SOLN
INTRAVENOUS | Status: AC
  Administered 2017-11-30 – 2017-12-02 (×5): via INTRAVENOUS
  Filled 2017-11-30 (×7): qty 1000

## 2017-11-30 MED ORDER — PROMETHAZINE HCL 25 MG/ML IJ SOLN
6.2500 mg | Freq: Four times a day (QID) | INTRAMUSCULAR | Status: DC | PRN
  Administered 2017-12-03 – 2017-12-04 (×2): 6.25 mg via INTRAVENOUS
  Filled 2017-11-30 (×2): qty 1

## 2017-11-30 MED ORDER — MORPHINE SULFATE (PF) 4 MG/ML IV SOLN: 1.0000 mg | INTRAVENOUS | Status: DC | PRN

## 2017-11-30 MED ORDER — ACETAMINOPHEN 325 MG PO TABS
650.0000 mg | ORAL_TABLET | Freq: Four times a day (QID) | ORAL | Status: DC | PRN
  Administered 2017-12-13: 650 mg via ORAL
  Filled 2017-11-30 (×2): qty 2

## 2017-11-30 MED ORDER — HYDRALAZINE HCL 20 MG/ML IJ SOLN
10.0000 mg | INTRAMUSCULAR | Status: DC | PRN
  Administered 2017-11-30 – 2017-12-04 (×3): 10 mg via INTRAVENOUS
  Filled 2017-11-30 (×3): qty 1

## 2017-11-30 NOTE — Progress Notes (Signed)
PROGRESS NOTE    Lisa Hopkins  UVO:[id number] DOB: 09-02-1924 DOA: 11/29/2017 PCP: Lisa Hopkins, William, MD  Brief Narrative:  Lisa Hopkins is a 84 y.o. female with medical history significant for hypertension, history of diabetes now diet controlled, chronic kidney disease stage II, history of CVA, dementia, and atrial fibrillation on warfarin, now presenting to the emergency department for evaluation of upper abdominal pain, nausea, and nonbloody vomiting.  Patient was admitted to the hospital from 11/25/2017 until 11/29/2017 for the same symptoms, had GI and surgical consultations, underwent CT of the abdomen and pelvis and EGD, and her complaints were attributed to large hiatal hernia.  She improved with medical management during the hospitalization, was discharged in much improved condition, and was planning to follow-up with surgery as an outpatient for possible surgical treatment.  Unfortunately, soon after leaving the hospital, her pain became severe again and she had recurrence and nausea with nonbloody vomiting.  She reports the symptoms to be the same as those during the recent hospitalization, denies fevers or chills, and denies chest pain or shortness of breath.  Her son is at the bedside and assists with the history.  ED Course: Upon arrival to the ED, patient is found to be afebrile, saturating well on room air, mildly hypertensive, and vitals otherwise normal.  Chemistry panel is notable for a potassium of 3.3 and bilirubin 1.6.  CBC is notable for a mild polycythemia.  Patient was given 500 cc normal saline, IV morphine, and IV Zofran in the ED.  She had transient improvement in her symptoms with these measures in the ED, but pain became unbearable and she developed recurrent nausea with vomiting within about an hour.  She will be observed in the hospital for ongoing evaluation and management and may need her surgical follow-up to be done inpatient.      Assessment & Plan:     Principal Problem:   Intractable abdominal pain Active Problems:   Essential hypertension   Atrial fibrillation (HCC)   History of CVA (cerebrovascular accident)   CKD stage 2 due to type 2 diabetes mellitus (HCC)  1-Persistent Nausea vomiting, abdominal pain, Para esophageal Hiatal hernia.  Came back with nausea, vomiting.  Endoscopy 7-3; showed proximal esophageal wed, stomach shows hiatal hernia no ulcer.  -surgery and GI consulted again.  IV fluids.  IV Zofran and phenergan PRN for nausea.    Coffee ground emesis;  Started IV Protonix gtt.  PRN Zofran for nausea.  IV fluids.  GI notified of readmission and new problem.  KUB     DVT prophylaxis: SCD Code Status: DNR Family Communication: son at bedside.  Disposition Plan: to be determine    Consultants:   Surgery   GI    Procedures: none   Antimicrobials:  none   Subjective: She just vomited, coffee ground fluid.  Complaints of abdominal pain. Weak , tired.    Objective: Vitals:   11/30/17 0045 11/30/17 0145 11/30/17 0219 11/30/17 0438  BP: (!) 150/99 (!) 188/107 123/61 (!) 146/77  Pulse: 83 82 84 94  Resp: 18 18 16 16   Temp:   97.7 F (36.5 C) 97.8 F (36.6 C)  TempSrc:   Oral Oral  SpO2: 98% 97% 96% 95%  Weight:   55.4 kg (122 lb 2.2 oz)   Height:   5\' 5"  (1.651 m)     Intake/Output Summary (Last 24 hours) at 11/30/2017 0748 Last data filed at 11/30/2017 0132 Gross per 24 hour  Intake 500 ml  Output -  Net 500 ml   Filed Weights   11/30/17 0219  Weight: 55.4 kg (122 lb 2.2 oz)    Examination:  General exam: mild distress, was vomiting.  Respiratory system: Clear to auscultation. Respiratory effort normal. Cardiovascular system: S1 & S2 heard, RRR. No JVD, murmurs, rubs, gallops or clicks. No pedal edema. Gastrointestinal system: soft, mild tenderness.  Central nervous system: alert, close eyes at ime Extremities: Symmetric 5 x 5 power. Skin: No rashes, lesions or  ulcers Psychiatry: Judgement and insight appear normal. Mood & affect appropriate.     Data Reviewed: I have personally reviewed following labs and imaging studies  CBC: Recent Labs  Lab 11/25/17 1457 11/26/17 0631 11/27/17 0431 11/28/17 0244 11/29/17 2248  WBC 14.0* 11.8* 12.2* 10.1 9.1  HGB 16.6* 13.8 14.4 15.0 16.6*  HCT 50.4* 42.4 44.0 46.6* 50.1*  MCV 94.6 95.1 94.6 94.7 94.4  PLT 205 194 209 216 236   Basic Metabolic Panel: Recent Labs  Lab 11/26/17 0631 11/28/17 0244 11/29/17 0351 11/29/17 2248 11/30/17 0225  NA 139 136 135 136 137  K 3.8 3.4* 3.9 3.3* 3.2*  CL 103 102 105 95* 98  CO2 24 26 24 24 22   GLUCOSE 119* 122* 116* 149* 156*  BUN 21 16 13 14 14   CREATININE 0.89 0.89 0.76 0.94 0.89  CALCIUM 8.9 8.9 8.4* 9.3 9.1  MG  --   --   --   --  1.8   GFR: Estimated Creatinine Clearance: 36.7 mL/min (by C-G formula based on SCr of 0.89 mg/dL). Liver Function Tests: Recent Labs  Lab 11/24/17 1950 11/25/17 1457 11/26/17 0631 11/29/17 2248 11/30/17 0225  AST 23 23 21 25 22   ALT 15 17 14 17 16   ALKPHOS 104 121 81 102 98  BILITOT 1.0 1.3* 1.0 1.6* 1.5*  PROT 6.5 7.7 5.8* 6.9 6.8  ALBUMIN 3.7 4.0 3.1* 3.8 3.6   Recent Labs  Lab 11/24/17 1950 11/25/17 1457 11/26/17 0631 11/29/17 2248  LIPASE 55* 36 37 50   No results for input(s): AMMONIA in the last 168 hours. Coagulation Profile: Recent Labs  Lab 11/26/17 0631 11/27/17 0431 11/28/17 0244 11/29/17 0351 11/30/17 0225  INR 2.40 2.41 2.49 2.94 2.85   Cardiac Enzymes: Recent Labs  Lab 11/24/17 2041 11/26/17 0052 11/26/17 0631 11/26/17 1230  TROPONINI <0.03 <0.03 <0.03 <0.03   BNP (last 3 results) No results for input(s): PROBNP in the last 8760 hours. HbA1C: No results for input(s): HGBA1C in the last 72 hours. CBG: Recent Labs  Lab 11/28/17 0728 11/28/17 1703 11/28/17 2051 11/29/17 0802 11/29/17 1208  GLUCAP 114* 106* 120* 92 102*   Lipid Profile: No results for input(s):  CHOL, HDL, LDLCALC, TRIG, CHOLHDL, LDLDIRECT in the last 72 hours. Thyroid Function Tests: No results for input(s): TSH, T4TOTAL, FREET4, T3FREE, THYROIDAB in the last 72 hours. Anemia Panel: No results for input(s): VITAMINB12, FOLATE, FERRITIN, TIBC, IRON, RETICCTPCT in the last 72 hours. Sepsis Labs: No results for input(s): PROCALCITON, LATICACIDVEN in the last 168 hours.  No results found for this or any previous visit (from the past 240 hour(s)).       Radiology Studies: No results found.      Scheduled Meds: . cholecalciferol  2,000 Units Oral Daily  .  HYDROmorphone (DILAUDID) injection  0.5 mg Intravenous Once  . losartan  50 mg Oral Daily  . metoprolol tartrate  12.5 mg Oral BID  . pantoprazole  40 mg Oral Daily  . QUEtiapine  25 mg Oral QHS   Continuous Infusions: . sodium chloride    . famotidine (PEPCID) IV 20 mg (11/30/17 0515)  . potassium chloride       LOS: 0 days    Time spent: 35 minutes.     Alba Cory, MD [facility] Pager [phone]  If 7PM-7AM, please contact night-coverage [email or web address] Password [facility] 11/30/2017, 7:48 AM

## 2017-11-30 NOTE — Consult Note (Signed)
Cardiology Consultation:   Patient ID: Lisa Hopkins; [id number]; 23-Oct-1924   Admit date: 11/29/2017 Date of Consult: 11/30/2017  Primary Care Provider: Lucky Cowboy, MD Primary Cardiologist: Lisa Alexander, MD  Primary Electrophysiologist:  NA    Patient Profile:   Lisa Hopkins is a 84 y.o. female with a hx of chronic a fib, CVA-2010 and chronic anticoagulation, chronic diastolic HF, HTN, DM on insulin, HLD pulmonary hypertension,  hx colitis, last seen in the office 04/28/16 who is being seen today for the evaluation of pre-op clearance for possible Exploratory lab, open G tube,/ pexy after admit with abd pain at the request of Dr. Dwain Hopkins with surgery.  History of Present Illness:   Lisa Hopkins has a hx of chronic a fib, CVA in 2010 and chronic anticoagulation on coumadin, chronic diastolic HF, HTN, HLD pulmonary hypertension, and hx colitis.     Last Echo 2011 with EF 55-60%, G1DD, PA pressure 46 mmHg.  Last seen by Cardiology 04/2016.    Admitted 11/29/17 with abd pain and N/V.   She had been d/c'd 11/29/17 for intractable N/V, abd pain and large HH.  She had been seen by surgery with conservative management unless continued symptoms.   Her symptoms did increase after discharge and she returned to ER.  She has needed morphine and IV pepcid.     She remains in a fib with CHA2DS2VASc score of 7. Her INR is 2.87 and coumadin is now on hold.    Hopkins+ 3.2, Mg+ 1.8, Na 137.  Hgb is 16.44m WBC 9.1 pts 236  Troponin on last admit neg.   EKG 11/24/17  I personally reviewed with A fib rate controlled and no acute changes.    Currently very weak, no chest pain or SOB.  Not on tele, will order EKG.  GI MD now evaluating her as well.      Past Medical History:  Diagnosis Date  . Anxiety   . Aphasia as late effect of cerebrovascular accident   . Atrial fibrillation (HCC)   . CVA (cerebral infarction)   . Diabetes mellitus type II   . GERD (gastroesophageal reflux disease)   .  Hiatal hernia   . HTN (hypertension)   . Hyperlipidemia   . IBS (irritable bowel syndrome)   . Stroke (HCC)   . Vitamin D deficiency     Past Surgical History:  Procedure Laterality Date  . CATARACT EXTRACTION, BILATERAL    . CHOLECYSTECTOMY    . COLONOSCOPY  2005  . ESOPHAGOGASTRODUODENOSCOPY (EGD) WITH PROPOFOL N/A 11/28/2017   Procedure: ESOPHAGOGASTRODUODENOSCOPY (EGD) WITH PROPOFOL;  Surgeon: Lisa Shiver, MD;  Location[facility] ENDOSCOPY;  Service: Endoscopy;  Laterality: N/A;  . TONSILLECTOMY    . UPPER GASTROINTESTINAL ENDOSCOPY  2005     Home Medications:  Prior to Admission medications   Medication Sig Start Date End Date Taking? Authorizing Provider  Cholecalciferol (VITAMIN D) 2000 UNITS CAPS Take 2,000 Units by mouth daily.    Yes [provider]  CINNAMON PO Take 1 capsule by mouth daily.   Yes [provider]  dicyclomine (BENTYL) 20 MG tablet Take 1 tablet 3 x day if needed for nausea, bloating , cramping or diarrhea 06/24/15  Yes Lisa Cowboy, MD  losartan (COZAAR) 50 MG tablet TAKE 1 TABLET EVERY MORNING, CHECK BP AT LUNCH-IF BP ABOVE 140/90 TAKE ANOTHER TABLET. 11/14/17  Yes Lisa Gaudier, NP  Magnesium Hydroxide (MAGNESIA PO) Take 1 tablet by mouth daily.    Yes [provider]  metoprolol tartrate (LOPRESSOR) 25 MG tablet Take 0.5 tablets (12.5 mg total) by mouth 2 (two) times daily. 07/16/17 11/30/17 Yes Lisa Cowboy, MD  ondansetron (ZOFRAN ODT) 4 MG disintegrating tablet Take 1 tablet (4 mg total) by mouth every 8 (eight) hours as needed for nausea or vomiting. 11/29/17  Yes Rai, Ripudeep K, MD  pantoprazole (PROTONIX) 40 MG tablet TAKE 1 TABLET ONCE DAILY FOR REFLUX. 11/30/17  Yes Lisa Cowboy, MD  QUEtiapine (SEROQUEL) 25 MG tablet Take 1 tablet at bedtime Patient taking differently: Take 12.5 mg by mouth at bedtime.  10/03/17 09/24/18 Yes Lisa Cowboy, MD  ranitidine (ZANTAC) 300 MG tablet Take 1 tablet (300 mg total) by mouth 2  (two) times daily. 07/27/17  Yes Lisa Cowboy, MD  warfarin (COUMADIN) 2 MG tablet TAKE AS DIRECTED BY ANTICOAGULATION CLINIC. Patient taking differently: Take 2.5mg  on Tues, Thur, Sat and 5mg  on all other days. 09/10/17  Yes Lisa Masson, MD    Inpatient Medications: Scheduled Meds: . cholecalciferol  2,000 Units Oral Daily  .  HYDROmorphone (DILAUDID) injection  0.5 mg Intravenous Once  . [START ON 12/03/2017] pantoprazole  40 mg Intravenous Q12H  . QUEtiapine  25 mg Oral QHS   Continuous Infusions: . dextrose 5 % and 0.9 % NaCl with KCl 20 mEq/L 100 mL/hr at 11/30/17 1100  . pantoprozole (PROTONIX) infusion 8 mg/hr (11/30/17 1104)   PRN Meds: acetaminophen **OR** acetaminophen, bisacodyl, dicyclomine, hydrALAZINE, morphine injection, ondansetron **OR** ondansetron (ZOFRAN) IV, promethazine, senna-docusate  Allergies:    Allergies  Allergen Reactions  . Ace Inhibitors Other (See Comments)    Cough  . Fosamax [Alendronate Sodium] Other (See Comments)    Heart burn  . Norvasc [Amlodipine Besylate] Other (See Comments)    edema  . Zocor [Simvastatin] Other (See Comments)    Elevated CPK    Social History:   Social History   Socioeconomic History  . Marital status: Married    Spouse name: Not on file  . Number of children: Not on file  . Years of education: Not on file  . Highest education level: Not on file  Occupational History  . Not on file  Social Needs  . Financial resource strain: Not on file  . Food insecurity:    Worry: Not on file    Inability: Not on file  . Transportation needs:    Medical: Not on file    Non-medical: Not on file  Tobacco Use  . Smoking status: Never Smoker  . Smokeless tobacco: Never Used  Substance and Sexual Activity  . Alcohol use: No  . Drug use: No  . Sexual activity: Never  Lifestyle  . Physical activity:    Days per week: Not on file    Minutes per session: Not on file  . Stress: Not on file  Relationships  .  Social connections:    Talks on phone: Not on file    Gets together: Not on file    Attends religious service: Not on file    Active member of club or organization: Not on file    Attends meetings of clubs or organizations: Not on file    Relationship status: Not on file  . Intimate partner violence:    Fear of current or ex partner: Not on file    Emotionally abused: Not on file    Physically abused: Not on file    Forced sexual activity: Not on file  Other Topics Concern  . Not on  file  Social History Narrative  . Not on file    Family History:    Family History  Problem Relation Age of Onset  . Diabetes Mother   . Stroke Mother   . Hypertension Brother   . Heart disease Brother   . Arthritis Sister        Rhematoid  . Arthritis Sister        Rhematoid     ROS:  Please see the history of present illness.  General:no colds or fevers, no weight changes Skin:no rashes or ulcers HEENT:no blurred vision, no congestion CV:see HPI PUL:see HPI GI:no diarrhea constipation or melena, no indigestion + abd pain.   GU:no hematuria, no dysuria MS:no joint pain, no claudication Neuro:no syncope, no lightheadedness Endo:+ diabetes, no thyroid disease  All other ROS reviewed and negative.     Physical Exam/Data:   Vitals:   11/30/17 0145 11/30/17 0219 11/30/17 0438 11/30/17 1358  BP: (!) 188/107 123/61 (!) 146/77 (!) 179/114  Pulse: 82 84 94 96  Resp: 18 16 16    Temp:  97.7 F (36.5 C) 97.8 F (36.6 C) 98 F (36.7 C)  TempSrc:  Oral Oral Oral  SpO2: 97% 96% 95% 98%  Weight:  122 lb 2.2 oz (55.4 kg)    Height:  5\' 5"  (1.651 m)      Intake/Output Summary (Last 24 hours) at 11/30/2017 1530 Last data filed at 11/30/2017 1350 Gross per 24 hour  Intake 770.57 ml  Output 200 ml  Net 570.57 ml   Filed Weights   11/30/17 0219  Weight: 122 lb 2.2 oz (55.4 kg)   Body mass index is 20.32 kg/m.  General:  Frail female, in no acute distress with intermittent abd pain.  Very  weak HEENT: normal Lymph: no adenopathy Neck: no JVD Endocrine:  No thryomegaly Vascular: No carotid bruits; pedal pulses 1+ bilaterally Cardiac:  irreg irreg; no murmur, gallup rub or click Lungs:  clear to auscultation bilaterally, no wheezing, occ rhonchi Lt base no rales  Abd: soft, mild tenderness, no hepatomegaly  Ext: no edema Musculoskeletal:  No deformities, BUE and BLE strength normal and equal weak Skin: warm and dry  Neuro:  Alert and oriented X 3 MAE, follows commands, no focal abnormalities noted Psych:  Normal affect    Relevant CV Studies: See above for Echo 2011.   Laboratory Data:  Chemistry Recent Labs  Lab 11/29/17 0351 11/29/17 2248 11/30/17 0225  NA 135 136 137  Hopkins 3.9 3.3* 3.2*  CL 105 95* 98  CO2 24 24 22   GLUCOSE 116* 149* 156*  BUN 13 14 14   CREATININE 0.76 0.94 0.89  CALCIUM 8.4* 9.3 9.1  GFRNONAA >60 52* 55*  GFRAA >60 >60 >60  ANIONGAP 6 17* 17*    Recent Labs  Lab 11/26/17 0631 11/29/17 2248 11/30/17 0225  PROT 5.8* 6.9 6.8  ALBUMIN 3.1* 3.8 3.6  AST 21 25 22   ALT 14 17 16   ALKPHOS 81 102 98  BILITOT 1.0 1.6* 1.5*   Hematology Recent Labs  Lab 11/27/17 0431 11/28/17 0244 11/29/17 2248  WBC 12.2* 10.1 9.1  RBC 4.65 4.92 5.31*  HGB 14.4 15.0 16.6*  HCT 44.0 46.6* 50.1*  MCV 94.6 94.7 94.4  MCH 31.0 30.5 31.3  MCHC 32.7 32.2 33.1  RDW 12.9 12.7 12.5  PLT 209 216 236   Cardiac Enzymes Recent Labs  Lab 11/24/17 2041 11/26/17 0052 11/26/17 0631 11/26/17 1230  TROPONINI <0.03 <0.03 <0.03 <0.03  No results for input(s): TROPIPOC in the last 168 hours.  BNPNo results for input(s): BNP, PROBNP in the last 168 hours.  DDimer No results for input(s): DDIMER in the last 168 hours.  Radiology/Studies:  Dg Abd 1 View  Result Date: 11/30/2017 CLINICAL DATA:  Nausea, vomiting. EXAM: ABDOMEN - 1 VIEW COMPARISON:  Radiographs of November 25, 2017. FINDINGS: The bowel gas pattern is normal. Status post cholecystectomy. Phleboliths  are noted in the pelvis. IMPRESSION: No evidence of bowel obstruction or ileus. Electronically Signed   By: Lupita Raider, M.D.   On: 11/30/2017 09:49    Assessment and Plan:   1. Pre-op Clearance for Exploratory lab, open G tube,/ pexy --  No acute changes on EKG-- coumadin on hold.  Would check echo.  RCRI risk of 4 = to 11% risk of major cardiac event. Dr. Cristal Deer to see. 2. Chronic atrial fib with rate control.   3. Chronic anticoagulation on coumadin with INR today  2.85  -last dose of coumadin 11/28/17 - once INR < 2.0 she needs anticoagulation on heparin until surgery.  4. No angina 5. Intractable abd pain and N/V.  Per GI and surgery. 6. Hx CVA 7. DM-2 on insulin per IM.  8. Pt is DNR 9. Hypokalemia Hopkins+ replaced.    For questions or updates, please contact [facility] Please consult [email or web address] for contact info under Cardiology/STEMI.   Signed, Nada Boozer, NP  11/30/2017 3:30 PM

## 2017-11-30 NOTE — H&P (Signed)
History and Physical    Lisa Hopkins [id number] DOB: Aug 14, 1924 DOA: 11/29/2017  PCP: Lucky CowboyMcKeown, William, MD   Patient coming from: [facility]   Chief Complaint: Upper abdominal pain, N/V   HPI: Lisa Hopkins is a 84 y.o. female with medical history significant for hypertension, history of diabetes now diet controlled, chronic kidney disease stage II, history of CVA, dementia, and atrial fibrillation on warfarin, now presenting to the emergency department for evaluation of upper abdominal pain, nausea, and nonbloody vomiting.  Patient was admitted to the hospital from 11/25/2017 until 11/29/2017 for the same symptoms, had GI and surgical consultations, underwent CT of the abdomen and pelvis and EGD, and her complaints were attributed to large hiatal hernia.  She improved with medical management during the hospitalization, was discharged in much improved condition, and was planning to follow-up with surgery as an outpatient for possible surgical treatment.  Unfortunately, soon after leaving the hospital, her pain became severe again and she had recurrence and nausea with nonbloody vomiting.  She reports the symptoms to be the same as those during the recent hospitalization, denies fevers or chills, and denies chest pain or shortness of breath.  Her son is at the bedside and assists with the history.  ED Course: Upon arrival to the ED, patient is found to be afebrile, saturating well on room air, mildly hypertensive, and vitals otherwise normal.  Chemistry panel is notable for a potassium of 3.3 and bilirubin 1.6.  CBC is notable for a mild polycythemia.  Patient was given 500 cc normal saline, IV morphine, and IV Zofran in the ED.  She had transient improvement in her symptoms with these measures in the ED, but pain became unbearable and she developed recurrent nausea with vomiting within about an hour.  She will be observed in the hospital for ongoing evaluation and management and may need her surgical  follow-up to be done inpatient.   Review of Systems:  All other systems reviewed and apart from HPI, are negative.  Past Medical History:  Diagnosis Date  . Anxiety   . Aphasia as late effect of cerebrovascular accident   . Atrial fibrillation (HCC)   . CVA (cerebral infarction)   . Diabetes mellitus type II   . GERD (gastroesophageal reflux disease)   . Hiatal hernia   . HTN (hypertension)   . Hyperlipidemia   . IBS (irritable bowel syndrome)   . Stroke (HCC)   . Vitamin D deficiency     Past Surgical History:  Procedure Laterality Date  . CATARACT EXTRACTION, BILATERAL    . CHOLECYSTECTOMY    . COLONOSCOPY  2005  . TONSILLECTOMY    . UPPER GASTROINTESTINAL ENDOSCOPY  2005     reports that she has never smoked. She has never used smokeless tobacco. She reports that she does not drink alcohol or use drugs.  Allergies  Allergen Reactions  . Ace Inhibitors     Cough  . Fosamax [Alendronate Sodium]     Heart burn  . Norvasc [Amlodipine Besylate]     edema  . Zocor [Simvastatin]     Elevated CPK    Family History  Problem Relation Age of Onset  . Diabetes Mother   . Stroke Mother   . Hypertension Brother   . Heart disease Brother   . Arthritis Sister        Rhematoid  . Arthritis Sister        Rhematoid     Prior to Admission medications  Medication Sig Start Date End Date Taking? Authorizing Provider  Cholecalciferol (VITAMIN D) 2000 UNITS CAPS Take 2,000 Units by mouth daily.     [provider]  dicyclomine (BENTYL) 20 MG tablet Take 1 tablet 3 x day if needed for nausea, bloating , cramping or diarrhea 06/24/15   Lucky Cowboy, MD  losartan (COZAAR) 50 MG tablet TAKE 1 TABLET EVERY MORNING, CHECK BP AT LUNCH-IF BP ABOVE 140/90 TAKE ANOTHER TABLET. 11/14/17   Judd Gaudier, NP  Magnesium Hydroxide (MAGNESIA PO) Take 1 tablet by mouth daily.     [provider]  metoprolol tartrate (LOPRESSOR) 25 MG tablet Take 0.5 tablets (12.5 mg  total) by mouth 2 (two) times daily. 07/16/17 11/26/17  Lucky Cowboy, MD  ondansetron (ZOFRAN ODT) 4 MG disintegrating tablet Take 1 tablet (4 mg total) by mouth every 8 (eight) hours as needed for nausea or vomiting. 11/29/17   Rai, Ripudeep K, MD  pantoprazole (PROTONIX) 40 MG tablet Take 1 tablet (40 mg total) by mouth daily. 11/29/17   Rai, Delene Ruffini, MD  QUEtiapine (SEROQUEL) 25 MG tablet Take 1 tablet at bedtime 10/03/17 09/24/18  Lucky Cowboy, MD  ranitidine (ZANTAC) 300 MG tablet Take 1 tablet (300 mg total) by mouth 2 (two) times daily. 07/27/17   Lucky Cowboy, MD  warfarin (COUMADIN) 2 MG tablet TAKE AS DIRECTED BY ANTICOAGULATION CLINIC. Patient taking differently: take 2 mg tablet daily 09/10/17   Lars Masson, MD    Physical Exam: Vitals:   11/29/17 2257 11/29/17 2301 11/29/17 2350 11/30/17 0045  BP: (!) 195/115 (!) 176/115 (!) 205/118 (!) 150/99  Pulse: 94 89 82 83  Resp: 16 18  18   Temp: 97.6 F (36.4 C) 98.9 F (37.2 C)    TempSrc: Oral Oral    SpO2: 100% 99% 100% 98%      Constitutional: NAD, in apparent discomfort  Eyes: PERTLA, lids and conjunctivae normal ENMT: Mucous membranes are moist. Posterior pharynx clear of any exudate or lesions.   Neck: normal, supple, no masses, no thyromegaly Respiratory: clear to auscultation bilaterally, no wheezing, no crackles. Normal respiratory effort.    Cardiovascular: Rate ~80. No extremity edema. No significant JVD. Abdomen: No distension, soft, tender in upper abdomen without rebound pain or guarding. Bowel sounds active.  Musculoskeletal: no clubbing / cyanosis. No joint deformity upper and lower extremities.    Skin: no significant rashes, lesions, ulcers. Warm, dry, well-perfused. Neurologic: No facial asymmetry. Sensation intact. Moving all extremities.  Psychiatric:  Alert and oriented to person, place, and situation, but not time. Cooperative.     Labs on Admission: I have personally reviewed following labs  and imaging studies  CBC: Recent Labs  Lab 11/25/17 1457 11/26/17 0631 11/27/17 0431 11/28/17 0244 11/29/17 2248  WBC 14.0* 11.8* 12.2* 10.1 9.1  HGB 16.6* 13.8 14.4 15.0 16.6*  HCT 50.4* 42.4 44.0 46.6* 50.1*  MCV 94.6 95.1 94.6 94.7 94.4  PLT 205 194 209 216 236   Basic Metabolic Panel: Recent Labs  Lab 11/25/17 1457 11/26/17 0631 11/28/17 0244 11/29/17 0351 11/29/17 2248  NA 139 139 136 135 136  K 3.7 3.8 3.4* 3.9 3.3*  CL 101 103 102 105 95*  CO2 25 24 26 24 24   GLUCOSE 203* 119* 122* 116* 149*  BUN 19 21 16 13 14   CREATININE 0.90 0.89 0.89 0.76 0.94  CALCIUM 9.6 8.9 8.9 8.4* 9.3   GFR: Estimated Creatinine Clearance: 32 mL/min (by C-G formula based on SCr of 0.94 mg/dL).  Liver Function Tests: Recent Labs  Lab 11/24/17 1950 11/25/17 1457 11/26/17 0631 11/29/17 2248  AST 23 23 21 25   ALT 15 17 14 17   ALKPHOS 104 121 81 102  BILITOT 1.0 1.3* 1.0 1.6*  PROT 6.5 7.7 5.8* 6.9  ALBUMIN 3.7 4.0 3.1* 3.8   Recent Labs  Lab 11/24/17 1950 11/25/17 1457 11/26/17 0631 11/29/17 2248  LIPASE 55* 36 37 50   No results for input(s): AMMONIA in the last 168 hours. Coagulation Profile: Recent Labs  Lab 11/24/17 2041 11/26/17 0631 11/27/17 0431 11/28/17 0244 11/29/17 0351  INR 2.18 2.40 2.41 2.49 2.94   Cardiac Enzymes: Recent Labs  Lab 11/24/17 2041 11/26/17 0052 11/26/17 0631 11/26/17 1230  TROPONINI <0.03 <0.03 <0.03 <0.03   BNP (last 3 results) No results for input(s): PROBNP in the last 8760 hours. HbA1C: No results for input(s): HGBA1C in the last 72 hours. CBG: Recent Labs  Lab 11/28/17 0728 11/28/17 1703 11/28/17 2051 11/29/17 0802 11/29/17 1208  GLUCAP 114* 106* 120* 92 102*   Lipid Profile: No results for input(s): CHOL, HDL, LDLCALC, TRIG, CHOLHDL, LDLDIRECT in the last 72 hours. Thyroid Function Tests: No results for input(s): TSH, T4TOTAL, FREET4, T3FREE, THYROIDAB in the last 72 hours. Anemia Panel: No results for  input(s): VITAMINB12, FOLATE, FERRITIN, TIBC, IRON, RETICCTPCT in the last 72 hours. Urine analysis:    Component Value Date/Time   COLORURINE YELLOW 11/29/2017 2240   APPEARANCEUR HAZY (A) 11/29/2017 2240   LABSPEC 1.017 11/29/2017 2240   PHURINE 5.0 11/29/2017 2240   GLUCOSEU 50 (A) 11/29/2017 2240   HGBUR MODERATE (A) 11/29/2017 2240   BILIRUBINUR NEGATIVE 11/29/2017 2240   KETONESUR 20 (A) 11/29/2017 2240   PROTEINUR 30 (A) 11/29/2017 2240   UROBILINOGEN 1.0 04/02/2015 1244   NITRITE NEGATIVE 11/29/2017 2240   LEUKOCYTESUR NEGATIVE 11/29/2017 2240   Sepsis Labs: @LABRCNTIP (procalcitonin:4,lacticidven:4) )No results found for this or any previous visit (from the past 240 hour(s)).   Radiological Exams on Admission: No results found.  EKG: Not performed.   Assessment/Plan  1. Intractable abdominal pain; nausea/vomiting; large hiatal hernia   - Presents with pain in upper abdomen, nausea, and non-bloody vomiting several hours after being discharged for the same; pt and family felt ready for d/c with plan for outpatient follow-up, but she unfortunately has had recurrent symptoms  - GI and gen surgery consulted during recent admission, she underwent CT abd/pelvis, EGD, and sxs were attributed to large hiatal hernia  - She had transient improvement in ED with IV morphine and Zofran, but sxs returned within an hour, pt is in obvious discomfort, and unable to tolerate oral intake  - Resume IV Pepcid, continue daily PPI, start IV hydration until tolerating adequate PO, continue IV analgesia and antiemetics    2. Atrial fibrillation  - CHADS-VASc 7 (age x2, CVA x2, ASCVD, gender, HTN)  - Continue warfarin and metoprolol    3. Hypertension  - BP modestly elevated in ED in setting of abd pain and N/V  - Treat pain and nausea, continue losartan and Lopressor   4. Hypokalemia  - Serum potassium is 3.3 on admission in setting of N/V and likely d/t GI-losses  - KCl added to IVF  -  Repeat chem panel in am    5. CKD stage II  - SCr is 0.94 on admission, similar to priors  - Hydrate gently until tolerating diet, monitor renal fxn and lytes    DVT prophylaxis:  Code Status: Full  Family Communication:  Son updated at bedside Consults called: None Admission status: Observation     Briscoe Deutscher, MD [facility] Pager [phone]  If 7PM-7AM, please contact night-coverage [email or web address] Password [email or web address]  11/30/2017, 1:17 AM

## 2017-11-30 NOTE — Progress Notes (Signed)
Pt from ED, c/ abdominal pain 10/10 and vomiting. Morphine and zofran was given at 2356. Made on call MD aware. X1 phenergan and dilaudid ordered. Phenergan given first. Pt stated she feel better. Will hold dilaudid at this time. Will continue to monitor pt.

## 2017-11-30 NOTE — Progress Notes (Signed)
-   patient seen and examined at bedside. Please see residents consult note from today.84 year old patient with large hiatal hernia. Underwent EGD on 11/28/2017 for evaluation of esophageal stricture prior to hernia repair which showed very large hiatal hernia and tortuous stomach. There was a benign stricture and the scope was able to advance without significant resistance.she was discharged home yesterday but she is readmitted now with worsening abdominal pain, nausea, vomiting along with coffee-ground emesis. Hemoglobin remained stable.  Physical examination, she is in some discomfort because of abdominal pain and nausea. She was having small amount of coffee-ground emesis at the time of my evaluation.abdomen is distended with generalized tenderness and guarding. No rebound. Bowel sounds presents.  Assessment ------------------- - Abdominal pain, nausea, coffee-ground emesis in a patient with very large hiatal hernia. Possibility of partial torsion/gastric volvulus cannot be ruled out because of worsening symptoms and coffee-ground emesis.  Recommendations ------------------------- - further management per surgical team. she would probably benefit from surgical intervention.if ongoing nausea and coffee-ground emesis and worsening abdominal pain, recommend repeat CT chest/abdomen for further evaluation or upper GI series to rule out gastric volvulus. - continue Protonix drip - GI will follow ,although there is a limited role of GI intervention at this time  Kathi DerParag Shaylin Blatt MD, FACP 11/30/2017, 4:41 PM  Contact #  [phone]

## 2017-11-30 NOTE — Progress Notes (Signed)
ANTICOAGULATION CONSULT NOTE - Initial Consult  Pharmacy Consult for warfarin Indication: atrial fibrillation  Allergies  Allergen Reactions  . Ace Inhibitors     Cough  . Fosamax [Alendronate Sodium]     Heart burn  . Norvasc [Amlodipine Besylate]     edema  . Zocor [Simvastatin]     Elevated CPK    Patient Measurements: Height: 5\' 5"  (165.1 cm) Weight: 122 lb 2.2 oz (55.4 kg) IBW/kg (Calculated) : 57  Vital Signs: Temp: 97.7 F (36.5 C) (07/05 0219) Temp Source: Oral (07/05 0219) BP: 123/61 (07/05 0219) Pulse Rate: 84 (07/05 0219)  Labs: Recent Labs    11/27/17 0431 11/28/17 0244 11/29/17 0351 11/29/17 2248 11/30/17 0225  HGB 14.4 15.0  --  16.6*  --   HCT 44.0 46.6*  --  50.1*  --   PLT 209 216  --  236  --   LABPROT 26.1* 26.7* 30.4*  --  29.7*  INR 2.41 2.49 2.94  --  2.85  CREATININE  --  0.89 0.76 0.94  --     Estimated Creatinine Clearance: 34.8 mL/min (by C-G formula based on SCr of 0.94 mg/dL).   Assessment: 89 YOF to continue on warfarin- she was discharged in the early afternoon of 7/4 and returned about 8hours after with complaints of worsening abdominal pain. INR made a large jump from 7/3 to 7/4 on AM lab draws. INR now after readmission is 2.85. Doubt patient took warfarin while she was out of the hospital for short period of time.  Goal of Therapy:  INR 2-3 Monitor platelets by anticoagulation protocol: Yes   Plan:  No warfarin now  Will recheck INR at noon and can assess if warfarin needs to be given 7/5 @ 1800  Dain Laseter D. Timmie Dugue, PharmD, BCPS Clinical Pharmacist [phone] Please check AMION for all [facility] numbers 11/30/2017 3:06 AM

## 2017-11-30 NOTE — Progress Notes (Signed)
ANTICOAGULATION CONSULT NOTE  Pharmacy Consult for Heparin (when INR < 2.0) Indication: atrial fibrillation  Allergies  Allergen Reactions  . Ace Inhibitors Other (See Comments)    Cough  . Fosamax [Alendronate Sodium] Other (See Comments)    Heart burn  . Norvasc [Amlodipine Besylate] Other (See Comments)    edema  . Zocor [Simvastatin] Other (See Comments)    Elevated CPK   Labs: Recent Labs    11/28/17 0244 11/29/17 0351 11/29/17 2248 11/30/17 0225  HGB 15.0  --  16.6*  --   HCT 46.6*  --  50.1*  --   PLT 216  --  236  --   LABPROT 26.7* 30.4*  --  29.7*  INR 2.49 2.94  --  2.85  CREATININE 0.89 0.76 0.94 0.89   Assessment: 89 YOF presents with ongoing complaints of abdominal pain.  She is on chronic warfarin therapy for history of AFib.  We have been asked to initiate IV heparin once INR is < 2.0.  INR now after readmission is 2.85. Warfarin now on hold while ongoing workup in process.  Goal of Therapy:  INR 2-3 Monitor platelets by anticoagulation protocol: Yes   Plan:  Check daily INR and start IV heparin if < 2.0 as ordered. Monitor s/s of bleeding.  Nadara MustardNita Trameka Dorough, PharmD., MS Clinical Pharmacist Pager:  [phone] Thank you for allowing pharmacy to be part of this patients care team. 11/30/2017 5:07 PM

## 2017-11-30 NOTE — Progress Notes (Signed)
[facility] Progress Note     Subjective: CC- n/v Patient discharged yesterday but returned last night due to n/v.  One of Ms. Brennan's sons is at bedside, other son was up with her all night and went home to rest. He will be available to talk later today.  States that she briefly felt ok yesterday afternoon, but around 6pm started having more abdominal pain, nausea, and vomiting. The last time she was able to eat anything other than liquids was 1 week ago.  Objective: Vital signs in last 24 hours: Temp:  [97.6 F (36.4 C)-98.9 F (37.2 C)] 97.8 F (36.6 C) (07/05 0438) Pulse Rate:  [82-100] 94 (07/05 0438) Resp:  [16-18] 16 (07/05 0438) BP: (123-205)/(61-118) 146/77 (07/05 0438) SpO2:  [95 %-100 %] 95 % (07/05 0438) Weight:  [122 lb 2.2 oz (55.4 kg)] 122 lb 2.2 oz (55.4 kg) (07/05 0219)    Intake/Output from previous day: 07/04 0701 - 07/05 0700 In: 500 [IV Piggyback:500] Out: -  Intake/Output this shift: Total I/O In: 159 [I.V.:159] Out: -   PE: Gen:  Alert, NAD, tired HEENT: EOM's intact, pupils equal and round Pulm:  CTAB, no W/R/R, effort normal Abd: Soft, mild distension, mild diffuse tenderness without rebound or guarding, +BS Ext:  Calves soft and nontender Skin: no rashes noted, warm and dry  Lab Results:  Recent Labs    11/28/17 0244 11/29/17 2248  WBC 10.1 9.1  HGB 15.0 16.6*  HCT 46.6* 50.1*  PLT 216 236   BMET Recent Labs    11/29/17 2248 11/30/17 0225  NA 136 137  K 3.3* 3.2*  CL 95* 98  CO2 24 22  GLUCOSE 149* 156*  BUN 14 14  CREATININE 0.94 0.89  CALCIUM 9.3 9.1   PT/INR Recent Labs    11/29/17 0351 11/30/17 0225  LABPROT 30.4* 29.7*  INR 2.94 2.85   CMP     Component Value Date/Time   NA 137 11/30/2017 0225   K 3.2 (L) 11/30/2017 0225   CL 98 11/30/2017 0225   CO2 22 11/30/2017 0225   GLUCOSE 156 (H) 11/30/2017 0225   BUN 14 11/30/2017 0225   CREATININE 0.89 11/30/2017 0225   CREATININE 1.03 (H)  11/08/2017 1100   CALCIUM 9.1 11/30/2017 0225   PROT 6.8 11/30/2017 0225   ALBUMIN 3.6 11/30/2017 0225   AST 22 11/30/2017 0225   ALT 16 11/30/2017 0225   ALKPHOS 98 11/30/2017 0225   BILITOT 1.5 (H) 11/30/2017 0225   GFRNONAA 55 (L) 11/30/2017 0225   GFRNONAA 48 (L) 11/08/2017 1100   GFRAA >60 11/30/2017 0225   GFRAA 55 (L) 11/08/2017 1100   Lipase     Component Value Date/Time   LIPASE 50 11/29/2017 2248       Studies/Results: No results found.  Anti-infectives: Anti-infectives (From admission, onward)   None       Assessment/Plan DM HTN CKD-II Afib - on coumadin H/o CVA Code status DNR  Intractable n/v, abd pain Large hiatal hernia - EGD 7/3 showed web without significant obstruction / PEH without obstruction or ischemia  - readmitted 7/4 with persistent n/v - not good candidate for hiatal hernia repair  ID - none FEN - IVF, NPO VTE - SCDs, hold coumadin (INR 2.85 today) Foley - none  Plan - Patient and her son at bedside unsure how they want to proceed, want to discuss with her other son. Option of ex lap G tube/pexy only viable option at her age. Hold  coumadin.    LOS: 0 days    Franne Forts , [facility] 11/30/2017, 9:22 AM Pager: [phone] Consults: [phone] Mon 7:00 am -11:30 AM Tues-Fri 7:00 am-4:30 pm Sat-Sun 7:00 am-11:30 am

## 2017-11-30 NOTE — Consult Note (Signed)
Subjective:   HPI  Lisa Hopkins is a 84 y.o female with atrial fibrillation on warfarin, DM, and HTN who presented to the ED with recurrent epigastric abdominal pain and N/V. She was hospitalized from 6/30 to 7/4 with similar symptoms felt to be secondary to a complex paraesphogeal hernia. EGD performed during that admission was significant for an esophageal web and large hiatal hernia. Shortly after discharge on 7/4 she began to experience recurrence of her epigastric abdominal pain and N/V. He son subsequently brought her back to the ED. Since admission she has had recurrence of her N/V with the most recent episode this AM. There was concerns for coffee ground emesis so GI was consulted for further evaluation.   Patient and son are in the room. Son was able to discuss surgical options with Dr. Dwain Sarna this AM and is talking to his other brother before proceeding. They are concerned that these symptoms will continue to recur. The patient voices concerns of ongoing abdominal pain but denies N/V.   Review of Systems 12 point ROS preformed. All negative aside from those mentioned in the HPI.  Past Medical History:  Diagnosis Date  . Anxiety   . Aphasia as late effect of cerebrovascular accident   . Atrial fibrillation (HCC)   . CVA (cerebral infarction)   . Diabetes mellitus type II   . GERD (gastroesophageal reflux disease)   . Hiatal hernia   . HTN (hypertension)   . Hyperlipidemia   . IBS (irritable bowel syndrome)   . Stroke (HCC)   . Vitamin D deficiency    Past Surgical History:  Procedure Laterality Date  . CATARACT EXTRACTION, BILATERAL    . CHOLECYSTECTOMY    . COLONOSCOPY  2005  . ESOPHAGOGASTRODUODENOSCOPY (EGD) WITH PROPOFOL N/A 11/28/2017   Procedure: ESOPHAGOGASTRODUODENOSCOPY (EGD) WITH PROPOFOL;  Surgeon: Graylin Shiver, MD;  Location: [facility] ENDOSCOPY;  Service: Endoscopy;  Laterality: N/A;  . TONSILLECTOMY    . UPPER GASTROINTESTINAL ENDOSCOPY  2005   Social History    Socioeconomic History  . Marital status: Married    Spouse name: Not on file  . Number of children: Not on file  . Years of education: Not on file  . Highest education level: Not on file  Occupational History  . Not on file  Social Needs  . Financial resource strain: Not on file  . Food insecurity:    Worry: Not on file    Inability: Not on file  . Transportation needs:    Medical: Not on file    Non-medical: Not on file  Tobacco Use  . Smoking status: Never Smoker  . Smokeless tobacco: Never Used  Substance and Sexual Activity  . Alcohol use: No  . Drug use: No  . Sexual activity: Never  Lifestyle  . Physical activity:    Days per week: Not on file    Minutes per session: Not on file  . Stress: Not on file  Relationships  . Social connections:    Talks on phone: Not on file    Gets together: Not on file    Attends religious service: Not on file    Active member of club or organization: Not on file    Attends meetings of clubs or organizations: Not on file    Relationship status: Not on file  . Intimate partner violence:    Fear of current or ex partner: Not on file    Emotionally abused: Not on file    Physically abused: Not  on file    Forced sexual activity: Not on file  Other Topics Concern  . Not on file  Social History Narrative  . Not on file   family history includes Arthritis in her sister and sister; Diabetes in her mother; Heart disease in her brother; Hypertension in her brother; Stroke in her mother.  Current Facility-Administered Medications:  .  acetaminophen (TYLENOL) tablet 650 mg, 650 mg, Oral, Q6H PRN **OR** acetaminophen (TYLENOL) suppository 650 mg, 650 mg, Rectal, Q6H PRN, Opyd, Lavone Neriimothy S, MD .  bisacodyl (DULCOLAX) EC tablet 5 mg, 5 mg, Oral, Daily PRN, Opyd, Lavone Neriimothy S, MD .  cholecalciferol (VITAMIN D) tablet 2,000 Units, 2,000 Units, Oral, Daily, Opyd, Timothy S, MD .  dextrose 5 % and 0.9 % NaCl with KCl 20 mEq/L infusion, ,  Intravenous, Continuous, Regalado, Belkys A, MD, Last Rate: 100 mL/hr at 11/30/17 1100 .  dicyclomine (BENTYL) tablet 20 mg, 20 mg, Oral, TID PRN, Opyd, Lavone Neriimothy S, MD .  hydrALAZINE (APRESOLINE) injection 10 mg, 10 mg, Intravenous, Q4H PRN, Opyd, Lavone Neriimothy S, MD, 10 mg at 11/30/17 0201 .  HYDROmorphone (DILAUDID) injection 0.5 mg, 0.5 mg, Intravenous, Once, Opyd, Timothy S, MD .  morphine 4 MG/ML injection 1-3 mg, 1-3 mg, Intravenous, Q4H PRN, Opyd, Timothy S, MD .  ondansetron (ZOFRAN) tablet 4 mg, 4 mg, Oral, Q6H PRN **OR** ondansetron (ZOFRAN) injection 4 mg, 4 mg, Intravenous, Q6H PRN, Opyd, Lavone Neriimothy S, MD, 4 mg at 11/30/17 0706 .  pantoprazole (PROTONIX) 80 mg in sodium chloride 0.9 % 250 mL (0.32 mg/mL) infusion, 8 mg/hr, Intravenous, Continuous, Regalado, Belkys A, MD, Last Rate: 25 mL/hr at 11/30/17 1104, 8 mg/hr at 11/30/17 1104 .  [START ON 12/03/2017] pantoprazole (PROTONIX) injection 40 mg, 40 mg, Intravenous, Q12H, Regalado, Belkys A, MD .  promethazine (PHENERGAN) injection 6.25 mg, 6.25 mg, Intravenous, Q6H PRN, Regalado, Belkys A, MD .  QUEtiapine (SEROQUEL) tablet 25 mg, 25 mg, Oral, QHS, Opyd, Timothy S, MD .  senna-docusate (Senokot-S) tablet 1 tablet, 1 tablet, Oral, QHS PRN, Opyd, Lavone Neriimothy S, MD Allergies  Allergen Reactions  . Ace Inhibitors     Cough  . Fosamax [Alendronate Sodium]     Heart burn  . Norvasc [Amlodipine Besylate]     edema  . Zocor [Simvastatin]     Elevated CPK    Objective:     BP (!) 146/77 (BP Location: Right Arm)   Pulse 94   Temp 97.8 F (36.6 C) (Oral)   Resp 16   Ht 5\' 5"  (1.651 m)   Wt 122 lb 2.2 oz (55.4 kg)   SpO2 95%   BMI 20.32 kg/m   General: This elderly female in no acute distress HENT: Normocephalic, atraumatic, moist mucus membranes Pulm: Good air movement with no wheezing or crackles  CV: Irregular rhythm, regular rate, no murmurs, no rubs  Abdomen: Active bowel sounds, soft, non-distended, no tenderness to palpation   Extremities: Pulses palpable in all extremities, no LE edema  Skin: Warm and dry  Neuro: Alert and oriented to person  Laboratory BMP Latest Ref Rng & Units 11/30/2017 11/29/2017 11/29/2017  Glucose 70 - 99 mg/dL 161(W156(H) 960(A149(H) 540(J116(H)  BUN 8 - 23 mg/dL 14 14 13   Creatinine 0.44 - 1.00 mg/dL 8.110.89 9.140.94 7.820.76  BUN/Creat Ratio 6 - 22 (calc) - - -  Sodium 135 - 145 mmol/L 137 136 135  Potassium 3.5 - 5.1 mmol/L 3.2(L) 3.3(L) 3.9  Chloride 98 - 111 mmol/L 98 95(L) 105  CO2 22 -  32 mmol/L 22 24 24   Calcium 8.9 - 10.3 mg/dL 9.1 9.3 1.6(X)   CBC Latest Ref Rng & Units 11/29/2017 11/28/2017 11/27/2017  WBC 4.0 - 10.5 K/uL 9.1 10.1 12.2(H)  Hemoglobin 12.0 - 15.0 g/dL 16.6(H) 15.0 14.4  Hematocrit 36.0 - 46.0 % 50.1(H) 46.6(H) 44.0  Platelets 150 - 400 K/uL 236 216 209    Assessment:     Lisa Hopkins is a 84 y.o female with atrial fibrillation on warfarin, DM, and HTN who presented to the ED with abdominal pain and N/V secondary to her paraesophageal hiatal hernia. This AM she experienced 1 episode of coffee ground emesis and GI was consulted for further evaluation.       Plan:     Coffee Ground Emesis - 1 Episode this AM  - Started on Famotidine, would recommend switching to PPI as H2 blockers do not have proven benefit in UGI  - Esophageal web noted on EGD from 7/03; during that time with stretching there was some minor bleeding but otherwise no evidence of active bleeding in the remained of the esophagus, stomach, or duodenum  - Repeat EGD unlikely to be helpful, recommend full liquid diet and to advance as tolerated   Complex Paraesophageal Hiatal Hernia  - Abdominal pain improved but persistent  - Surgery onboard and discussing treatment options with the family.  - Cardiology consulted for cardiac clearance   Levora Dredge, MD  Internal Medicine Resident, PGY2

## 2017-12-01 ENCOUNTER — Inpatient Hospital Stay (HOSPITAL_COMMUNITY): Payer: Medicare Other

## 2017-12-01 DIAGNOSIS — I361 Nonrheumatic tricuspid (valve) insufficiency: Secondary | ICD-10-CM

## 2017-12-01 LAB — BASIC METABOLIC PANEL
Anion gap: 5 (ref 5–15)
BUN: 14 mg/dL (ref 8–23)
CO2: 23 mmol/L (ref 22–32)
Calcium: 8.3 mg/dL — ABNORMAL LOW (ref 8.9–10.3)
Chloride: 111 mmol/L (ref 98–111)
Creatinine, Ser: 0.79 mg/dL (ref 0.44–1.00)
GFR calc Af Amer: >60 mL/min (ref >60)
GFR calc non Af Amer: >60 mL/min (ref >60)
Glucose, Bld: 166 mg/dL — ABNORMAL HIGH (ref 70–99)
Potassium: 3.7 mmol/L (ref 3.5–5.1)
Sodium: 139 mmol/L (ref 135–145)

## 2017-12-01 LAB — CBC
HCT: 42.3 % (ref 36.0–46.0)
HEMATOCRIT: 39.6 % (ref 36.0–46.0)
HEMOGLOBIN: 12.8 g/dL (ref 12.0–15.0)
HEMOGLOBIN: 14 g/dL (ref 12.0–15.0)
MCH: 31 pg (ref 26.0–34.0)
MCH: 31.3 pg (ref 26.0–34.0)
MCHC: 32.3 g/dL (ref 30.0–36.0)
MCHC: 33.1 g/dL (ref 30.0–36.0)
MCV: 95.9 fL (ref 78.0–100.0)
MCV: 94.4 fL (ref 78.0–100.0)
PLATELETS: 233 10*3/uL (ref 150–400)
Platelets: 214 10*3/uL (ref 150–400)
RBC: 4.13 MIL/uL (ref 3.87–5.11)
RBC: 4.48 MIL/uL (ref 3.87–5.11)
RDW: 13 % (ref 11.5–15.5)
RDW: 13 % (ref 11.5–15.5)
WBC: 10.8 10*3/uL — ABNORMAL HIGH (ref 4.0–10.5)
WBC: 12.3 10*3/uL — AB (ref 4.0–10.5)

## 2017-12-01 LAB — MAGNESIUM: Magnesium: 1.7 mg/dL (ref 1.7–2.4)

## 2017-12-01 LAB — HEPATIC FUNCTION PANEL
ALT: 12 U/L (ref 0–44)
AST: 16 U/L (ref 15–41)
Albumin: 2.6 g/dL — ABNORMAL LOW (ref 3.5–5.0)
Alkaline Phosphatase: 69 U/L (ref 38–126)
Bilirubin, Direct: 0.2 mg/dL (ref 0.0–0.2)
Indirect Bilirubin: 0.6 mg/dL (ref 0.3–0.9)
Total Bilirubin: 0.8 mg/dL (ref 0.3–1.2)
Total Protein: 4.8 g/dL — ABNORMAL LOW (ref 6.5–8.1)

## 2017-12-01 LAB — PROTIME-INR
INR: 5.81
INR: 5.35
INR: 1.92
INR: 1.8
PROTHROMBIN TIME: 21.8 s — AB (ref 11.4–15.2)
Prothrombin Time: 51.8 seconds — ABNORMAL HIGH (ref 11.4–15.2)
Prothrombin Time: 48.6 seconds — ABNORMAL HIGH (ref 11.4–15.2)
Prothrombin Time: 20.7 seconds — ABNORMAL HIGH (ref 11.4–15.2)

## 2017-12-01 LAB — ECHOCARDIOGRAM COMPLETE
Height: 65 in
Weight: 1996.49 oz

## 2017-12-01 LAB — GLUCOSE, CAPILLARY: Glucose-Capillary: 137 mg/dL — ABNORMAL HIGH (ref 70–99)

## 2017-12-01 MED ORDER — VITAMIN K1 10 MG/ML IJ SOLN: 5.0000 mg | Freq: Once | INTRAVENOUS | Status: DC

## 2017-12-01 MED ORDER — QUETIAPINE 12.5 MG HALF TABLET
12.5000 mg | ORAL_TABLET | Freq: Two times a day (BID) | ORAL | Status: DC | PRN
  Filled 2017-12-01: qty 1

## 2017-12-01 MED ORDER — ORAL CARE MOUTH RINSE
15.0000 mL | Freq: Two times a day (BID) | OROMUCOSAL | Status: DC
  Administered 2017-12-01 – 2017-12-12 (×24): 15 mL via OROMUCOSAL

## 2017-12-01 MED ORDER — MAGNESIUM SULFATE 2 GM/50ML IV SOLN
2.0000 g | Freq: Once | INTRAVENOUS | Status: AC
  Administered 2017-12-01: 2 g via INTRAVENOUS
  Filled 2017-12-01: qty 50

## 2017-12-01 MED ORDER — METOPROLOL TARTRATE 5 MG/5ML IV SOLN
2.5000 mg | Freq: Two times a day (BID) | INTRAVENOUS | Status: DC
  Administered 2017-12-01 – 2017-12-03 (×5): 2.5 mg via INTRAVENOUS
  Filled 2017-12-01 (×5): qty 5

## 2017-12-01 MED ORDER — VITAMIN K1 10 MG/ML IJ SOLN
2.5000 mg | Freq: Once | INTRAVENOUS | Status: AC
  Administered 2017-12-01: 2.5 mg via INTRAVENOUS
  Filled 2017-12-01: qty 0.25

## 2017-12-01 MED ORDER — HEPARIN (PORCINE) IN NACL 100-0.45 UNIT/ML-% IJ SOLN
900.0000 [IU]/h | INTRAMUSCULAR | Status: DC
  Administered 2017-12-01: 650 [IU]/h via INTRAVENOUS
  Administered 2017-12-03: 800 [IU]/h via INTRAVENOUS
  Administered 2017-12-04: 850 [IU]/h via INTRAVENOUS
  Administered 2017-12-05: 950 [IU]/h via INTRAVENOUS
  Administered 2017-12-06: 1050 [IU]/h via INTRAVENOUS
  Administered 2017-12-07 – 2017-12-08 (×2): 1000 [IU]/h via INTRAVENOUS
  Administered 2017-12-09: 950 [IU]/h via INTRAVENOUS
  Administered 2017-12-10 – 2017-12-12 (×2): 900 [IU]/h via INTRAVENOUS
  Filled 2017-12-01 (×10): qty 250

## 2017-12-01 NOTE — Progress Notes (Signed)
ANTICOAGULATION CONSULT NOTE - Follow Up Consult  Pharmacy Consult for heparin  Indication: atrial fibrillation and pulmonary embolus  Allergies  Allergen Reactions  . Ace Inhibitors Other (See Comments)    Cough  . Fosamax [Alendronate Sodium] Other (See Comments)    Heart burn  . Norvasc [Amlodipine Besylate] Other (See Comments)    edema  . Zocor [Simvastatin] Other (See Comments)    Elevated CPK    Patient Measurements: Height: 5\' 5"  (165.1 cm) Weight: 124 lb 12.5 oz (56.6 kg) IBW/kg (Calculated) : 57   Vital Signs: BP: 163/88 (07/06 1411) Pulse Rate: 80 (07/06 1411)  Labs: Recent Labs    11/29/17 2248 11/30/17 0225  12/01/17 0732 12/01/17 0839 12/01/17 1550 12/01/17 1710  HGB 16.6*  --   --  12.8  --  14.0  --   HCT 50.1*  --   --  39.6  --  42.3  --   PLT 236  --   --  214  --  233  --   LABPROT  --  29.7*   < >  --  48.6* 21.8* 20.7*  INR  --  2.85   < >  --  5.35* 1.92 1.80  CREATININE 0.94 0.89  --  0.79  --   --   --    < > = values in this interval not displayed.    Estimated Creatinine Clearance: 41.8 mL/min (by C-G formula based on SCr of 0.79 mg/dL).   Medications:  Scheduled:  . cholecalciferol  2,000 Units Oral Daily  .  HYDROmorphone (DILAUDID) injection  0.5 mg Intravenous Once  . mouth rinse  15 mL Mouth Rinse BID  . metoprolol tartrate  2.5 mg Intravenous Q12H  . [START ON 12/03/2017] pantoprazole  40 mg Intravenous Q12H    Assessment: 84 yo female with history of afib on coumadin PTA. Coumadin is on hold and pharmacy consulted to dose heparin. She is noted with N/V and recent coffee ground emesis with no bleeding noted. She is also s/p vitamin K 2.5mg  IV today. And INR is down to 1.8.   -Hg= 14, plt= 233  Spoke with GI and Dr Sunnie Nielsenegalado: Molli Knockkay to begin heparin. Continuing protonix drip    Goal of Therapy:  Heparin level 0.3-0.5 units/ml Monitor platelets by anticoagulation protocol: Yes   Plan:  -No heparin bolus -Begin heparin at  650 units/hr -Heparin level in 8 hours and daily wth CBC daily  Harland GermanAndrew Kierston Plasencia, PharmD Clinical Pharmacist Please check Amion for pharmacy contact number

## 2017-12-01 NOTE — Progress Notes (Signed)
Lengthy discussion with son about care planning for his mom. SW consult has been placed for DC planning. Their father (her spouse) recently died 2 months ago and pt has just recently moved to [place] in an independent apt.

## 2017-12-01 NOTE — Progress Notes (Signed)
CRITICAL VALUE ALERT  Critical Value:INR 5.81  Date & Time Notied:0600  Provider Notified: Clance Boll. Bodenheimer, NP  Orders Received/Actions taken: awaiting for reply

## 2017-12-01 NOTE — Progress Notes (Signed)
Noted that pt had part of a tooth that came out and it was on the floor, found by one of her sons.

## 2017-12-01 NOTE — Progress Notes (Signed)
  Progress Note: General Surgery Service   Assessment/Plan: Patient Active Problem List   Diagnosis Date Noted  . Intractable abdominal pain 11/30/2017  . CKD stage 2 due to type 2 diabetes mellitus (HCC) 08/02/2017  . Atherosclerosis of aorta (HCC) 12/03/2015  . GERD 09/25/2014  . Diastolic CHF (HCC) 08/04/2014  . Hyperlipidemia, mixed   . Vitamin D deficiency   . Anxiety state 03/15/2010  . Essential hypertension 03/15/2010  . Atrial fibrillation (HCC) 03/15/2010  . APHASIA DUE TO CEREBROVASCULAR DISEASE 03/15/2010  . History of CVA (cerebrovascular accident) 03/07/2010   hematemesis with supra-theraupeutic INR up to 5.8. Large hiatal hernia with recurrent vomiting and unable to tolerate PO -continue to hold anticoagulation -possible surgery later this week   LOS: 1 day  Chief Complaint/Subjective: Continued nausea, unable to tolerate PO, no bloody emesis yesterday  Objective: Vital signs in last 24 hours: Temp:  [98 F (36.7 C)-98.7 F (37.1 C)] 98.7 F (37.1 C) (07/06 0505) Pulse Rate:  [92-128] 98 (07/06 0600) Resp:  [18] 18 (07/06 0505) BP: (142-195)/(86-114) 142/86 (07/06 0600) SpO2:  [94 %-98 %] 98 % (07/06 0505) Weight:  [56.6 kg (124 lb 12.5 oz)] 56.6 kg (124 lb 12.5 oz) (07/06 0500) Last BM Date: 11/27/17  Intake/Output from previous day: 07/05 0701 - 07/06 0700 In: 2341.9 [I.V.:2341.9] Out: 500 [Urine:300; Emesis/NG output:200] Intake/Output this shift: No intake/output data recorded.  Gen: NAD Abd: soft, NT, ND  Rodman PickleLuke Aaron Jkayla Spiewak, MD [facility]

## 2017-12-01 NOTE — Progress Notes (Signed)
PROGRESS NOTE    Lisa Hopkins  ZOX:[id number] DOB: Jul 17, 1924 DOA: 11/29/2017 PCP: Lucky Cowboy, MD  Brief Narrative:  Lisa Hopkins is a 84 y.o. female with medical history significant for hypertension, history of diabetes now diet controlled, chronic kidney disease stage II, history of CVA, dementia, and atrial fibrillation on warfarin, now presenting to the emergency department for evaluation of upper abdominal pain, nausea, and nonbloody vomiting.  Patient was admitted to the hospital from 11/25/2017 until 11/29/2017 for the same symptoms, had GI and surgical consultations, underwent CT of the abdomen and pelvis and EGD, and her complaints were attributed to large hiatal hernia.  She improved with medical management during the hospitalization, was discharged in much improved condition, and was planning to follow-up with surgery as an outpatient for possible surgical treatment.  Unfortunately, soon after leaving the hospital, her pain became severe again and she had recurrence and nausea with nonbloody vomiting.  She reports the symptoms to be the same as those during the recent hospitalization, denies fevers or chills, and denies chest pain or shortness of breath.  Her son is at the bedside and assists with the history.  ED Course: Upon arrival to the ED, patient is found to be afebrile, saturating well on room air, mildly hypertensive, and vitals otherwise normal.  Chemistry panel is notable for a potassium of 3.3 and bilirubin 1.6.  CBC is notable for a mild polycythemia.  Patient was given 500 cc normal saline, IV morphine, and IV Zofran in the ED.  She had transient improvement in her symptoms with these measures in the ED, but pain became unbearable and she developed recurrent nausea with vomiting within about an hour.  She will be observed in the hospital for ongoing evaluation and management and may need her surgical follow-up to be done inpatient.      Assessment & Plan:     Principal Problem:   Intractable abdominal pain Active Problems:   Essential hypertension   Atrial fibrillation (HCC)   History of CVA (cerebrovascular accident)   CKD stage 2 due to type 2 diabetes mellitus (HCC)  1-Persistent Nausea vomiting, abdominal pain, Para esophageal Hiatal hernia.  Came back with nausea, vomiting.  Endoscopy 7-3; showed proximal esophageal wed, stomach shows hiatal hernia no ulcer.  -surgery and GI consulted again. Plan for surgery next week.  IV fluids.  IV Zofran and phenergan PRN for nausea.  Cardiology consulted for pre op eval. Echo ordered. Patient high risk for procedure.  If INR decreased tomorrow will discussed with sx regarding TPN and picc line placement.     Coffee ground emesis;  Continue with IV Protonix gtt.  PRN Zofran for nausea.  IV fluids.  GI notified of readmission and new problem.  KUB negative for obstruction.  If abdominal pain, and vomiting gets worse will need CT abdomen, per GI rec.  Hb drop to 12 from 16, suspect component of hemoconcentration on admission.  INR at 5, will give low dose of vitamin K. LFT normal.     History of CVA; A fib; will need to be on heparin when INR less than 2.   CKD stage 2; stable   Chronic A fib;  On metoprolol at home. Will order IV metoprolol BID>    DVT prophylaxis: SCD Code Status: DNR Family Communication: son at bedside. Explain to him patient is high risk for sx.  Disposition Plan: to be determine    Consultants:   Surgery   GI  Procedures: none   Antimicrobials:  none   Subjective: Sleepy, open eyes to voice. Denies abdominal pain.  No further coffee ground emesis since yesterday    Objective: Vitals:   12/01/17 0100 12/01/17 0500 12/01/17 0505 12/01/17 0600  BP:   (!) 195/106 (!) 142/86  Pulse: 92  92 98  Resp:   18   Temp:   98.7 F (37.1 C)   TempSrc:   Oral   SpO2:   98%   Weight:  56.6 kg (124 lb 12.5 oz)    Height:        Intake/Output  Summary (Last 24 hours) at 12/01/2017 0950 Last data filed at 12/01/2017 0600 Gross per 24 hour  Intake 2071.35 ml  Output 500 ml  Net 1571.35 ml   Filed Weights   11/30/17 0219 12/01/17 0500  Weight: 55.4 kg (122 lb 2.2 oz) 56.6 kg (124 lb 12.5 oz)    Examination:  General exam: sleepy, appears comfortable.  Respiratory system: Normal respiratory effort, CTA Cardiovascular system: S 1, S 2 RRR Gastrointestinal system: Soft, NT, NR Central nervous system: sleepy, open eyes to voice.  Extremities: no edema Skin: No rashes.      Data Reviewed: I have personally reviewed following labs and imaging studies  CBC: Recent Labs  Lab 11/26/17 0631 11/27/17 0431 11/28/17 0244 11/29/17 2248 12/01/17 0732  WBC 11.8* 12.2* 10.1 9.1 10.8*  HGB 13.8 14.4 15.0 16.6* 12.8  HCT 42.4 44.0 46.6* 50.1* 39.6  MCV 95.1 94.6 94.7 94.4 95.9  PLT 194 209 216 236 214   Basic Metabolic Panel: Recent Labs  Lab 11/28/17 0244 11/29/17 0351 11/29/17 2248 11/30/17 0225 12/01/17 0732  NA 136 135 136 137 139  K 3.4* 3.9 3.3* 3.2* 3.7  CL 102 105 95* 98 111  CO2 26 24 24 22 23   GLUCOSE 122* 116* 149* 156* 166*  BUN 16 13 14 14 14   CREATININE 0.89 0.76 0.94 0.89 0.79  CALCIUM 8.9 8.4* 9.3 9.1 8.3*  MG  --   --   --  1.8 1.7   GFR: Estimated Creatinine Clearance: 41.8 mL/min (by C-G formula based on SCr of 0.79 mg/dL). Liver Function Tests: Recent Labs  Lab 11/24/17 1950 11/25/17 1457 11/26/17 0631 11/29/17 2248 11/30/17 0225  AST 23 23 21 25 22   ALT 15 17 14 17 16   ALKPHOS 104 121 81 102 98  BILITOT 1.0 1.3* 1.0 1.6* 1.5*  PROT 6.5 7.7 5.8* 6.9 6.8  ALBUMIN 3.7 4.0 3.1* 3.8 3.6   Recent Labs  Lab 11/24/17 1950 11/25/17 1457 11/26/17 0631 11/29/17 2248  LIPASE 55* 36 37 50   No results for input(s): AMMONIA in the last 168 hours. Coagulation Profile: Recent Labs  Lab 11/28/17 0244 11/29/17 0351 11/30/17 0225 12/01/17 0457 12/01/17 0839  INR 2.49 2.94 2.85 5.81* 5.35*    Cardiac Enzymes: Recent Labs  Lab 11/24/17 2041 11/26/17 0052 11/26/17 0631 11/26/17 1230  TROPONINI <0.03 <0.03 <0.03 <0.03   BNP (last 3 results) No results for input(s): PROBNP in the last 8760 hours. HbA1C: No results for input(s): HGBA1C in the last 72 hours. CBG: Recent Labs  Lab 11/28/17 2051 11/29/17 0802 11/29/17 1208 11/30/17 0845 12/01/17 0806  GLUCAP 120* 92 102* 153* 137*   Lipid Profile: No results for input(s): CHOL, HDL, LDLCALC, TRIG, CHOLHDL, LDLDIRECT in the last 72 hours. Thyroid Function Tests: No results for input(s): TSH, T4TOTAL, FREET4, T3FREE, THYROIDAB in the last 72 hours. Anemia Panel: No results for  input(s): VITAMINB12, FOLATE, FERRITIN, TIBC, IRON, RETICCTPCT in the last 72 hours. Sepsis Labs: No results for input(s): PROCALCITON, LATICACIDVEN in the last 168 hours.  No results found for this or any previous visit (from the past 240 hour(s)).       Radiology Studies: Dg Abd 1 View  Result Date: 11/30/2017 CLINICAL DATA:  Nausea, vomiting. EXAM: ABDOMEN - 1 VIEW COMPARISON:  Radiographs of November 25, 2017. FINDINGS: The bowel gas pattern is normal. Status post cholecystectomy. Phleboliths are noted in the pelvis. IMPRESSION: No evidence of bowel obstruction or ileus. Electronically Signed   By: Lupita Raider, M.D.   On: 11/30/2017 09:49        Scheduled Meds: . cholecalciferol  2,000 Units Oral Daily  .  HYDROmorphone (DILAUDID) injection  0.5 mg Intravenous Once  . mouth rinse  15 mL Mouth Rinse BID  . [START ON 12/03/2017] pantoprazole  40 mg Intravenous Q12H   Continuous Infusions: . dextrose 5 % and 0.9 % NaCl with KCl 20 mEq/L 100 mL/hr at 12/01/17 0831  . magnesium sulfate 1 - 4 g bolus IVPB    . pantoprozole (PROTONIX) infusion 8 mg/hr (12/01/17 0600)  . phytonadione (VITAMIN K) IV       LOS: 1 day    Time spent: 35 minutes.     Alba Cory, MD [facility] Pager [phone]  If 7PM-7AM, please  contact night-coverage [email or web address] Password [email or web address] 12/01/2017, 9:50 AM

## 2017-12-01 NOTE — Progress Notes (Signed)
INR redrawn this am but still high. Dr. Sunnie Nielsenegalado aware.

## 2017-12-01 NOTE — Progress Notes (Signed)
Spoke with pts 2 sons who live in [place]. Discussed options for after pt is discharged.  Placed Social work consult for post DC placement as [place].

## 2017-12-01 NOTE — Progress Notes (Signed)
  Echocardiogram 2D Echocardiogram has been performed.  Delcie RochENNINGTON, Megham Dwyer 12/01/2017, 3:28 PM

## 2017-12-02 ENCOUNTER — Inpatient Hospital Stay (HOSPITAL_COMMUNITY): Payer: Medicare Other

## 2017-12-02 ENCOUNTER — Inpatient Hospital Stay: Payer: Self-pay

## 2017-12-02 LAB — CBC
HCT: 38.4 % (ref 36.0–46.0)
Hemoglobin: 12.6 g/dL (ref 12.0–15.0)
MCH: 31.4 pg (ref 26.0–34.0)
MCHC: 32.8 g/dL (ref 30.0–36.0)
MCV: 95.8 fL (ref 78.0–100.0)
PLATELETS: 201 10*3/uL (ref 150–400)
RBC: 4.01 MIL/uL (ref 3.87–5.11)
RDW: 13 % (ref 11.5–15.5)
WBC: 9.7 10*3/uL (ref 4.0–10.5)

## 2017-12-02 LAB — PHOSPHORUS: Phosphorus: 2.1 mg/dL — ABNORMAL LOW (ref 2.5–4.6)

## 2017-12-02 LAB — BASIC METABOLIC PANEL
Anion gap: 8 (ref 5–15)
BUN: 5 mg/dL — AB (ref 8–23)
CO2: 22 mmol/L (ref 22–32)
CREATININE: 0.65 mg/dL (ref 0.44–1.00)
Calcium: 8 mg/dL — ABNORMAL LOW (ref 8.9–10.3)
Chloride: 107 mmol/L (ref 98–111)
GFR calc Af Amer: >60 mL/min (ref >60)
Glucose, Bld: 146 mg/dL — ABNORMAL HIGH (ref 70–99)
POTASSIUM: 3.4 mmol/L — AB (ref 3.5–5.1)
SODIUM: 137 mmol/L (ref 135–145)

## 2017-12-02 LAB — HEPARIN LEVEL (UNFRACTIONATED)
HEPARIN UNFRACTIONATED: 0.11 [IU]/mL — AB (ref 0.30–0.70)
HEPARIN UNFRACTIONATED: 0.32 [IU]/mL (ref 0.30–0.70)

## 2017-12-02 LAB — MAGNESIUM: MAGNESIUM: 1.7 mg/dL (ref 1.7–2.4)

## 2017-12-02 LAB — GLUCOSE, CAPILLARY
GLUCOSE-CAPILLARY: 136 mg/dL — AB (ref 70–99)
GLUCOSE-CAPILLARY: 181 mg/dL — AB (ref 70–99)
Glucose-Capillary: 152 mg/dL — ABNORMAL HIGH (ref 70–99)

## 2017-12-02 LAB — PROTIME-INR
INR: 1.28
Prothrombin Time: 15.9 seconds — ABNORMAL HIGH (ref 11.4–15.2)

## 2017-12-02 MED ORDER — INSULIN ASPART 100 UNIT/ML ~~LOC~~ SOLN
0.0000 [IU] | Freq: Four times a day (QID) | SUBCUTANEOUS | Status: DC
  Administered 2017-12-02 – 2017-12-03 (×2): 2 [IU] via SUBCUTANEOUS
  Administered 2017-12-03 (×3): 1 [IU] via SUBCUTANEOUS
  Administered 2017-12-04 (×3): 2 [IU] via SUBCUTANEOUS
  Administered 2017-12-04: 1 [IU] via SUBCUTANEOUS
  Administered 2017-12-05: 2 [IU] via SUBCUTANEOUS
  Administered 2017-12-05: 1 [IU] via SUBCUTANEOUS
  Administered 2017-12-05: 2 [IU] via SUBCUTANEOUS
  Administered 2017-12-05: 1 [IU] via SUBCUTANEOUS
  Administered 2017-12-06: 2 [IU] via SUBCUTANEOUS
  Administered 2017-12-06: 1 [IU] via SUBCUTANEOUS
  Administered 2017-12-06 (×2): 2 [IU] via SUBCUTANEOUS
  Administered 2017-12-07: 1 [IU] via SUBCUTANEOUS
  Administered 2017-12-07 (×2): 2 [IU] via SUBCUTANEOUS
  Administered 2017-12-07: 1 [IU] via SUBCUTANEOUS
  Administered 2017-12-08: 2 [IU] via SUBCUTANEOUS
  Administered 2017-12-08 – 2017-12-12 (×5): 1 [IU] via SUBCUTANEOUS

## 2017-12-02 MED ORDER — MAGNESIUM SULFATE 2 GM/50ML IV SOLN
2.0000 g | Freq: Once | INTRAVENOUS | Status: AC
  Administered 2017-12-02: 2 g via INTRAVENOUS
  Filled 2017-12-02: qty 50

## 2017-12-02 MED ORDER — DEXTROSE 5 % IV SOLN
20.0000 mmol | Freq: Once | INTRAVENOUS | Status: AC
  Administered 2017-12-02: 20 mmol via INTRAVENOUS
  Filled 2017-12-02: qty 6.67

## 2017-12-02 MED ORDER — POTASSIUM CHLORIDE 10 MEQ/100ML IV SOLN
10.0000 meq | INTRAVENOUS | Status: AC
  Administered 2017-12-02 (×3): 10 meq via INTRAVENOUS
  Filled 2017-12-02 (×3): qty 100

## 2017-12-02 MED ORDER — SODIUM CHLORIDE 0.9% FLUSH
10.0000 mL | Freq: Two times a day (BID) | INTRAVENOUS | Status: DC
  Administered 2017-12-07 – 2017-12-12 (×4): 10 mL

## 2017-12-02 MED ORDER — TRAVASOL 10 % IV SOLN
INTRAVENOUS | Status: AC
  Administered 2017-12-02: 17:00:00 via INTRAVENOUS
  Filled 2017-12-02: qty 400.32

## 2017-12-02 MED ORDER — PANTOPRAZOLE SODIUM 40 MG IV SOLR
40.0000 mg | Freq: Two times a day (BID) | INTRAVENOUS | Status: DC
  Administered 2017-12-02 – 2017-12-07 (×11): 40 mg via INTRAVENOUS
  Filled 2017-12-02 (×14): qty 40

## 2017-12-02 MED ORDER — SODIUM CHLORIDE 0.9 % IV SOLN
INTRAVENOUS | Status: AC
  Administered 2017-12-02 – 2017-12-03 (×2): via INTRAVENOUS

## 2017-12-02 NOTE — Progress Notes (Addendum)
PROGRESS NOTE    Lisa Hopkins  GNF:[id number] DOB: August 04, 1924 DOA: 11/29/2017 PCP: Lucky CowboyMcKeown, William, MD  Brief Narrative:  Lisa Hopkins is a 84 y.o. female with medical history significant for hypertension, history of diabetes now diet controlled, chronic kidney disease stage II, history of CVA, dementia, and atrial fibrillation on warfarin, now presenting to the emergency department for evaluation of upper abdominal pain, nausea, and nonbloody vomiting.  Patient was admitted to the hospital from 11/25/2017 until 11/29/2017 for the same symptoms, had GI and surgical consultations, underwent CT of the abdomen and pelvis and EGD, and her complaints were attributed to large hiatal hernia.  She improved with medical management during the hospitalization, was discharged in much improved condition, and was planning to follow-up with surgery as an outpatient for possible surgical treatment.  Unfortunately, soon after leaving the hospital, her pain became severe again and she had recurrence and nausea with nonbloody vomiting.  She reports the symptoms to be the same as those during the recent hospitalization, denies fevers or chills, and denies chest pain or shortness of breath.  Her son is at the bedside and assists with the history.  ED Course: Upon arrival to the ED, patient is found to be afebrile, saturating well on room air, mildly hypertensive, and vitals otherwise normal.  Chemistry panel is notable for a potassium of 3.3 and bilirubin 1.6.  CBC is notable for a mild polycythemia.  Patient was given 500 cc normal saline, IV morphine, and IV Zofran in the ED.  She had transient improvement in her symptoms with these measures in the ED, but pain became unbearable and she developed recurrent nausea with vomiting within about an hour.  She will be observed in the hospital for ongoing evaluation and management and may need her surgical follow-up to be done inpatient.      Assessment & Plan:     Principal Problem:   Intractable abdominal pain Active Problems:   Essential hypertension   Atrial fibrillation (HCC)   History of CVA (cerebrovascular accident)   CKD stage 2 due to type 2 diabetes mellitus (HCC)  1-Persistent Nausea vomiting, abdominal pain, Para esophageal Hiatal hernia.  Came back with nausea, vomiting.  Endoscopy 7-3; showed proximal esophageal wed, stomach shows hiatal hernia no ulcer.  -surgery and GI consulted again. Plan for surgery next week.  IV fluids.  IV Zofran and phenergan PRN for nausea.  Cardiology consulted for pre op eval. Echo normal EF, mitral and tricuspid regurgitation. Follow cardio rec. Patient high risk for procedure.  Discussed with sx, start TPN for nutrition.    Coffee ground emesis;  Initially on IV Protonix gtt. Now on protonix IV BID>  PRN Zofran for nausea.  IV fluids.  GI notified of readmission and new problem.  KUB negative for obstruction.  If abdominal pain, and vomiting gets worse will need CT abdomen, per GI rec.  Hb stable.  No further vomiting.    History of CVA; A fib; will need to be on heparin when INR less than 2.  Now on heparin Gtt  CKD stage 2; stable   Chronic A fib;  On metoprolol at home. IV metoprolol BID>    DVT prophylaxis: SCD Code Status: DNR Family Communication: son at bedside. Explain to him patient is high risk for sx.  Disposition Plan: to be determine    Consultants:   Surgery   GI    Procedures: none   Antimicrobials:  none   Subjective: Alert, denies abdominal  pain.  No nausea. Was not able to sleep last night   Objective: Vitals:   12/01/17 1411 12/01/17 2027 12/02/17 0440 12/02/17 1039  BP: (!) 163/88 (!) 174/95 (!) 154/94   Pulse: 80 81 74   Resp: 20 16 15    Temp:  97.7 F (36.5 C) 97.9 F (36.6 C)   TempSrc:  Oral Oral   SpO2: 98% 96% 93%   Weight:    55.8 kg (123 lb)  Height:        Intake/Output Summary (Last 24 hours) at 12/02/2017 1409 Last data  filed at 12/02/2017 1111 Gross per 24 hour  Intake 3476.25 ml  Output -  Net 3476.25 ml   Filed Weights   11/30/17 0219 12/01/17 0500 12/02/17 1039  Weight: 55.4 kg (122 lb 2.2 oz) 56.6 kg (124 lb 12.5 oz) 55.8 kg (123 lb)    Examination:  General exam: NAD Respiratory system: CTA Cardiovascular system: S 1, S 2 RRR Gastrointestinal system: Soft, nt, nd Central nervous system: alert.   Extremities: no edema Skin: No rashes.      Data Reviewed: I have personally reviewed following labs and imaging studies  CBC: Recent Labs  Lab 11/28/17 0244 11/29/17 2248 12/01/17 0732 12/01/17 1550 12/02/17 0612  WBC 10.1 9.1 10.8* 12.3* 9.7  HGB 15.0 16.6* 12.8 14.0 12.6  HCT 46.6* 50.1* 39.6 42.3 38.4  MCV 94.7 94.4 95.9 94.4 95.8  PLT 216 236 214 233 201   Basic Metabolic Panel: Recent Labs  Lab 11/29/17 0351 11/29/17 2248 11/30/17 0225 12/01/17 0732 12/02/17 0612 12/02/17 1212  NA 135 136 137 139 137  --   K 3.9 3.3* 3.2* 3.7 3.4*  --   CL 105 95* 98 111 107  --   CO2 24 24 22 23 22   --   GLUCOSE 116* 149* 156* 166* 146*  --   BUN 13 14 14 14  5*  --   CREATININE 0.76 0.94 0.89 0.79 0.65  --   CALCIUM 8.4* 9.3 9.1 8.3* 8.0*  --   MG  --   --  1.8 1.7  --  1.7  PHOS  --   --   --   --   --  2.1*   GFR: Estimated Creatinine Clearance: 41.2 mL/min (by C-G formula based on SCr of 0.65 mg/dL). Liver Function Tests: Recent Labs  Lab 11/25/17 1457 11/26/17 0631 11/29/17 2248 11/30/17 0225 12/01/17 0732  AST 23 21 25 22 16   ALT 17 14 17 16 12   ALKPHOS 121 81 102 98 69  BILITOT 1.3* 1.0 1.6* 1.5* 0.8  PROT 7.7 5.8* 6.9 6.8 4.8*  ALBUMIN 4.0 3.1* 3.8 3.6 2.6*   Recent Labs  Lab 11/25/17 1457 11/26/17 0631 11/29/17 2248  LIPASE 36 37 50   No results for input(s): AMMONIA in the last 168 hours. Coagulation Profile: Recent Labs  Lab 12/01/17 0457 12/01/17 0839 12/01/17 1550 12/01/17 1710 12/02/17 0612  INR 5.81* 5.35* 1.92 1.80 1.28   Cardiac  Enzymes: Recent Labs  Lab 11/26/17 0052 11/26/17 0631 11/26/17 1230  TROPONINI <0.03 <0.03 <0.03   BNP (last 3 results) No results for input(s): PROBNP in the last 8760 hours. HbA1C: No results for input(s): HGBA1C in the last 72 hours. CBG: Recent Labs  Lab 11/29/17 0802 11/29/17 1208 11/30/17 0845 12/01/17 0806 12/02/17 0809  GLUCAP 92 102* 153* 137* 136*   Lipid Profile: No results for input(s): CHOL, HDL, LDLCALC, TRIG, CHOLHDL, LDLDIRECT in the last 72 hours. Thyroid  Function Tests: No results for input(s): TSH, T4TOTAL, FREET4, T3FREE, THYROIDAB in the last 72 hours. Anemia Panel: No results for input(s): VITAMINB12, FOLATE, FERRITIN, TIBC, IRON, RETICCTPCT in the last 72 hours. Sepsis Labs: No results for input(s): PROCALCITON, LATICACIDVEN in the last 168 hours.  No results found for this or any previous visit (from the past 240 hour(s)).       Radiology Studies: [place] Ekg Site Rite  Result Date: 12/02/2017 If [facility] image not attached, placement could not be confirmed due to current cardiac rhythm.       Scheduled Meds: . cholecalciferol  2,000 Units Oral Daily  .  HYDROmorphone (DILAUDID) injection  0.5 mg Intravenous Once  . insulin aspart  0-9 Units Subcutaneous Q6H  . mouth rinse  15 mL Mouth Rinse BID  . metoprolol tartrate  2.5 mg Intravenous Q12H  . pantoprazole  40 mg Intravenous Q12H   Continuous Infusions: . sodium chloride    . dextrose 5 % and 0.9 % NaCl with KCl 20 mEq/L 100 mL/hr at 12/02/17 1111  . heparin 800 Units/hr (12/02/17 0909)  . magnesium sulfate 1 - 4 g bolus IVPB    . potassium chloride 10 mEq (12/02/17 1346)  . potassium PHOSPHATE IVPB (in mmol)    . TPN ADULT (ION)       LOS: 2 days    Time spent: 35 minutes.     Alba Cory, MD [facility] Pager [phone]  If 7PM-7AM, please contact night-coverage [email or web address] Password [email or web address] 12/02/2017, 2:09 PM

## 2017-12-02 NOTE — Progress Notes (Addendum)
2200 gave son ice chips start as pt requested water.  He offered 2 but after second pt began coughing. He stated he feels like she has problems swallowing often.  Will try again in am by nurse administration.   0530 offered pt ginger ale to sip on. Is tolerating well and appears to be swallowing fine. Will continue to monitor.

## 2017-12-02 NOTE — Progress Notes (Signed)
Peripherally Inserted Central Catheter/Midline Placement  The IV Nurse has discussed with the patient and/or persons authorized to consent for the patient, the purpose of this procedure and the potential benefits and risks involved with this procedure.  The benefits include less needle sticks, lab draws from the catheter, and the patient may be discharged home with the catheter. Risks include, but not limited to, infection, bleeding, blood clot (thrombus formation), and puncture of an artery; nerve damage and irregular heartbeat and possibility to perform a PICC exchange if needed/ordered by physician.  Alternatives to this procedure were also discussed.  Bard Power PICC patient education guide, fact sheet on infection prevention and patient information card has been provided to patient /or left at bedside.    PICC/Midline Placement Documentation  PICC Double Lumen 12/02/17 PICC Right Basilic 39 cm 0 cm (Active)  Indication for Insertion or Continuance of Line Administration of hyperosmolar/irritating solutions (i.e. TPN, Vancomycin, etc.) 12/02/2017  2:38 PM  Exposed Catheter (cm) 0 cm 12/02/2017  2:38 PM  Site Assessment Clean;Dry;Intact 12/02/2017  2:38 PM  Lumen #1 Status Flushed;Blood return noted;Saline locked 12/02/2017  2:38 PM  Lumen #2 Status Flushed;Blood return noted;Saline locked 12/02/2017  2:38 PM  Dressing Type Transparent 12/02/2017  2:38 PM  Dressing Status Dry;Clean;Intact 12/02/2017  2:38 PM  Dressing Change Due 12/09/17 12/02/2017  2:38 PM       Audrie GallusByerly, Alix Lahmann Ramos 12/02/2017, 2:40 PM

## 2017-12-02 NOTE — Progress Notes (Signed)
ANTICOAGULATION CONSULT NOTE - Follow Up Consult  Pharmacy Consult for heparin  Indication: atrial fibrillation and pulmonary embolus  Allergies  Allergen Reactions  . Ace Inhibitors Other (See Comments)    Cough  . Fosamax [Alendronate Sodium] Other (See Comments)    Heart burn  . Norvasc [Amlodipine Besylate] Other (See Comments)    edema  . Zocor [Simvastatin] Other (See Comments)    Elevated CPK    Patient Measurements: Height: 5\' 5"  (165.1 cm) Weight: 124 lb 12.5 oz (56.6 kg) IBW/kg (Calculated) : 57   Vital Signs: Temp: 97.9 F (36.6 C) (07/07 0440) Temp Source: Oral (07/07 0440) BP: 154/94 (07/07 0440) Pulse Rate: 74 (07/07 0440)  Labs: Recent Labs    11/29/17 2248 11/30/17 0225  12/01/17 0732  12/01/17 1550 12/01/17 1710 12/02/17 0612  HGB 16.6*  --   --  12.8  --  14.0  --  12.6  HCT 50.1*  --   --  39.6  --  42.3  --  38.4  PLT 236  --   --  214  --  233  --  201  LABPROT  --  29.7*   < >  --    < > 21.8* 20.7* 15.9*  INR  --  2.85   < >  --    < > 1.92 1.80 1.28  HEPARINUNFRC  --   --   --   --   --   --   --  0.11*  CREATININE 0.94 0.89  --  0.79  --   --   --   --    < > = values in this interval not displayed.    Estimated Creatinine Clearance: 41.8 mL/min (by C-G formula based on SCr of 0.79 mg/dL).  Assessment: 84 yo female with history of afib on warfarin PTA- currently on hold as patient with N/V and recent coffee ground emesis with no bleeding noted. GI ok with heparin.  Heparin level this morning is low at 0.11 units/mL.  Goal of Therapy:  Heparin level 0.3-0.5 units/ml Monitor platelets by anticoagulation protocol: Yes   Plan:  Increase heparin to 800units/hr Heparin level in 8 hours Daily heparin level and CBC Follow for s/s bleeding, especially GI bleeding  Latanga Nedrow D. Laterrian Hevener, PharmD, BCPS Clinical Pharmacist [phone] Please check AMION for all [facility] numbers 12/02/2017 7:16 AM

## 2017-12-02 NOTE — Progress Notes (Signed)
PHARMACY - ADULT TOTAL PARENTERAL NUTRITION CONSULT NOTE   Pharmacy Consult for TPN Indication: Large hiatal hernia - intolerance to PO and NGT/Cortrak placement  Patient Measurements: Height: 5\' 5"  (165.1 cm) Weight: 124 lb 12.5 oz (56.6 kg) IBW/kg (Calculated) : 57 TPN AdjBW (KG): 55.4 Body mass index is 20.76 kg/m. Usual Weight: 54 kg  Assessment: This is a 84 year old female with PMH significant for HTN, DM-diet controlled, CKD stage II, history of CVA, dementia, atrial fibrillation on Coumadin prior to admission who had recent admission 6/30-7/4 for large hiatal hernia. She represents this admission with worsening abdominal pain and persistent nausea and non-bloody vomiting. Patient did have some coffee-ground emesis this admission but this has resolved and GI has signed off. Surgery plans to operate next week on hernia.   Patient has had low po intake since 11/25/17. Per son, prior to this patient was eating normally without weight loss. Since 6/30, patient was on liquid diet then soft diet but only taking in ~10-20% of her meal trays while admitted. After discharge on 7/4, she was able to eat a tiny amount of butternut squash soup and a few spoonfuls of applesauce x1 occurrence. No further intake since that time.   GI: Large hiatal hernia -possible surgery next week. LBM 7/4. PPI IV BID. Arms are slightly edematous.  Endo: Hx DM -diet controlled. CBGs 130-140s.  Insulin requirements in the past 24 hours: 0 units Lytes: Na/Cl wnl. K 3.4 - given 3 runs of KCl. Mg 1.7-given 2g on 7/6. No Phos available. CoCa 9.1. CO2 wnl. Renal: CKD stage II. SCr 0.65 (baseline 0.6-0.8). IVF-D5-NS+20K at 13500mL/hr.  Pulm: RA Cards: BP elevated, HR wnl Hepatobil: LFTs are wnl. Tbili was elevated on 7/4 but is now down to wnl. Alb 2.6.  Neuro: Hx CVA, dementia. Pain 3/10.  ID: WBC down to wnl. Afebrile.  Heme: Coumadin PTA -on hold. INR elevated on admission - reversed with Vit K 2.5mg  IV on 7/6. Now on  IV heparin drip until surgery. Noted coffee-ground emesis is resolved. GI has signed off. Remains on PPI IV.  TPN Access: PICC placed 7/7 TPN start date: 7/7  Nutritional Goals (per RD recommendation on pending): kCal: Protein:  Fluid:  Goal TPN rate is 60 ml/hr (provides 100)  Current Nutrition:  NPO  Plan:  Initiate TPN at 3030mL/hr. This TPN provides 40 g of protein, 130 g of dextrose, and 19 g of lipids which provides 795 kCals per day, meeting 50% of patient needs Electrolytes in TPN: standard for now and monitor; 1:1 YN:[id number]:Acetate Add MVI, trace elements. Add Sensitive SSI q6h and adjust as needed Change D5-NS+20K to NS at 70 ml/hr when new TPN hung at 1800PM.  Monitor TPN labs - Will check Phos level as none available for admit and replete outside TPN if needed today. Will check Mg as replaced yesterday without repeat. Follow-up RD recommendations and surgery plans.   Link SnufferJessica Tenicia Gural, PharmD, BCPS, BCCCP Clinical Pharmacist Clinical phone 12/02/2017 until 3:[phone] #[phone] After hours, please call #[phone] 12/02/2017,10:26 AM

## 2017-12-02 NOTE — Progress Notes (Signed)
ANTICOAGULATION CONSULT NOTE - Follow Up Consult  Pharmacy Consult for heparin  Indication: atrial fibrillation and pulmonary embolus  Allergies  Allergen Reactions  . Ace Inhibitors Other (See Comments)    Cough  . Fosamax [Alendronate Sodium] Other (See Comments)    Heart burn  . Norvasc [Amlodipine Besylate] Other (See Comments)    edema  . Zocor [Simvastatin] Other (See Comments)    Elevated CPK    Patient Measurements: Height: 5\' 5"  (165.1 cm) Weight: 123 lb (55.8 kg) IBW/kg (Calculated) : 57   Vital Signs: Temp: 98.2 F (36.8 C) (07/07 1603) Temp Source: Oral (07/07 1603) BP: 204/94 (07/07 1603) Pulse Rate: 85 (07/07 1603)  Labs: Recent Labs    11/30/17 0225  12/01/17 0732  12/01/17 1550 12/01/17 1710 12/02/17 0612 12/02/17 1524  HGB  --   --  12.8  --  14.0  --  12.6  --   HCT  --   --  39.6  --  42.3  --  38.4  --   PLT  --   --  214  --  233  --  201  --   LABPROT 29.7*   < >  --    < > 21.8* 20.7* 15.9*  --   INR 2.85   < >  --    < > 1.92 1.80 1.28  --   HEPARINUNFRC  --   --   --   --   --   --  0.11* 0.32  CREATININE 0.89  --  0.79  --   --   --  0.65  --    < > = values in this interval not displayed.    Estimated Creatinine Clearance: 41.2 mL/min (by C-G formula based on SCr of 0.65 mg/dL).  Assessment: 84 yo female with history of afib on warfarin PTA- currently on hold as patient with N/V and recent coffee ground emesis with no bleeding noted. GI ok with heparin.  Heparin level within goal 0.32  Goal of Therapy:  Heparin level 0.3-0.5 units/ml Monitor platelets by anticoagulation protocol: Yes   Plan:  Continue heparin 800units/hr Daily heparin level and CBC Follow for s/s bleeding, especially GI bleeding  Isaac BlissMichael Stefany Starace, PharmD, BCPS, BCCCP Clinical Pharmacist [phone]  Please check AMION for all [facility] numbers  12/02/2017 4:07 PM

## 2017-12-02 NOTE — Progress Notes (Signed)
   patient seen and examined at bedside. Yesterday's events noted. INR was reversed. Now subtherapeutic. Currently on heparin drip.no evidence of active GI bleeding. Hemoglobin stable.  She is  feeling somewhat better. Abdominal pain improving.  Physical examination, she is more comfortable. Not in acute distress.Marland Kitchen.abdomen is soft. Abdominal distention improved compared to prior exam. Mild discomfort but no tenderness on palpation. Bowel sounds presents. No peritoneal signs.  Assessment ------------------- -  84 year old patient with Abdominal pain, nausea, coffee-ground emesis in setting of very large hiatal hernia. No further evidence of bleeding. Hemoglobin stable.abdominal pain as well as nausea and vomiting improving. - H/O  A. Fib and pulmonary embolism. Coumadin on hold. Currently on heparin drip  Recommendations ------------------------- - no further GI workup planned. further management per surgery - continue PPI. - GI will sign off.call us back if needed  Kathi DerParag Roc Streett MD, FACP 12/02/2017, 10:11 AM  Contact #  [phone]

## 2017-12-02 NOTE — Plan of Care (Signed)
  Problem: Clinical Measurements: Goal: Will remain free from infection Outcome: Progressing   Problem: Elimination: Goal: Will not experience complications related to urinary retention Outcome: Progressing   Problem: Pain Managment: Goal: General experience of comfort will improve Outcome: Progressing   

## 2017-12-03 ENCOUNTER — Encounter (HOSPITAL_COMMUNITY): Payer: Self-pay | Admitting: General Practice

## 2017-12-03 LAB — COMPREHENSIVE METABOLIC PANEL
ALBUMIN: 2.5 g/dL — AB (ref 3.5–5.0)
ALK PHOS: 69 U/L (ref 38–126)
ALT: 11 U/L (ref 0–44)
AST: 14 U/L — AB (ref 15–41)
Anion gap: 4 — ABNORMAL LOW (ref 5–15)
BUN: 6 mg/dL — ABNORMAL LOW (ref 8–23)
CALCIUM: 8.2 mg/dL — AB (ref 8.9–10.3)
CHLORIDE: 107 mmol/L (ref 98–111)
CO2: 25 mmol/L (ref 22–32)
CREATININE: 0.64 mg/dL (ref 0.44–1.00)
GFR calc Af Amer: >60 mL/min (ref >60)
GFR calc non Af Amer: >60 mL/min (ref >60)
GLUCOSE: 108 mg/dL — AB (ref 70–99)
Potassium: 3.7 mmol/L (ref 3.5–5.1)
SODIUM: 136 mmol/L (ref 135–145)
Total Bilirubin: 1 mg/dL (ref 0.3–1.2)
Total Protein: 4.8 g/dL — ABNORMAL LOW (ref 6.5–8.1)

## 2017-12-03 LAB — DIFFERENTIAL
ABS IMMATURE GRANULOCYTES: 0 10*3/uL (ref 0.0–0.1)
BASOS PCT: 1 %
Basophils Absolute: 0.1 10*3/uL (ref 0.0–0.1)
EOS ABS: 0.4 10*3/uL (ref 0.0–0.7)
Eosinophils Relative: 4 %
Immature Granulocytes: 0 %
LYMPHS ABS: 2.6 10*3/uL (ref 0.7–4.0)
Lymphocytes Relative: 29 %
MONOS PCT: 10 %
Monocytes Absolute: 0.9 10*3/uL (ref 0.1–1.0)
NEUTROS ABS: 5 10*3/uL (ref 1.7–7.7)
NEUTROS PCT: 56 %

## 2017-12-03 LAB — CBC
HCT: 38.4 % (ref 36.0–46.0)
HEMOGLOBIN: 12.4 g/dL (ref 12.0–15.0)
MCH: 31.2 pg (ref 26.0–34.0)
MCHC: 32.3 g/dL (ref 30.0–36.0)
MCV: 96.7 fL (ref 78.0–100.0)
PLATELETS: 230 10*3/uL (ref 150–400)
RBC: 3.97 MIL/uL (ref 3.87–5.11)
RDW: 13.1 % (ref 11.5–15.5)
WBC: 8.9 10*3/uL (ref 4.0–10.5)

## 2017-12-03 LAB — GLUCOSE, CAPILLARY
GLUCOSE-CAPILLARY: 124 mg/dL — AB (ref 70–99)
Glucose-Capillary: 134 mg/dL — ABNORMAL HIGH (ref 70–99)
Glucose-Capillary: 132 mg/dL — ABNORMAL HIGH (ref 70–99)

## 2017-12-03 LAB — PHOSPHORUS: PHOSPHORUS: 3.1 mg/dL (ref 2.5–4.6)

## 2017-12-03 LAB — PROTIME-INR
INR: 1
PROTHROMBIN TIME: 13.1 s (ref 11.4–15.2)

## 2017-12-03 LAB — MAGNESIUM: Magnesium: 2.2 mg/dL (ref 1.7–2.4)

## 2017-12-03 LAB — PREALBUMIN: PREALBUMIN: 10.3 mg/dL — AB (ref 18–38)

## 2017-12-03 LAB — TRIGLYCERIDES: Triglycerides: 72 mg/dL (ref <150)

## 2017-12-03 LAB — HEPARIN LEVEL (UNFRACTIONATED): HEPARIN UNFRACTIONATED: 0.3 [IU]/mL (ref 0.30–0.70)

## 2017-12-03 MED ORDER — METOPROLOL TARTRATE 5 MG/5ML IV SOLN
5.0000 mg | INTRAVENOUS | Status: DC
  Administered 2017-12-03 – 2017-12-09 (×36): 5 mg via INTRAVENOUS
  Filled 2017-12-03 (×35): qty 5

## 2017-12-03 MED ORDER — SODIUM CHLORIDE 0.9 % IV SOLN
INTRAVENOUS | Status: DC
  Administered 2017-12-03: 22:00:00 via INTRAVENOUS

## 2017-12-03 MED ORDER — POTASSIUM CHLORIDE 10 MEQ/50ML IV SOLN
10.0000 meq | INTRAVENOUS | Status: AC
  Administered 2017-12-03 (×3): 10 meq via INTRAVENOUS
  Filled 2017-12-03 (×3): qty 50

## 2017-12-03 MED ORDER — TRAVASOL 10 % IV SOLN
INTRAVENOUS | Status: AC
  Administered 2017-12-03: 18:00:00 via INTRAVENOUS
  Filled 2017-12-03: qty 806.4

## 2017-12-03 NOTE — Progress Notes (Signed)
Initial Nutrition Assessment  DOCUMENTATION CODES:   Not applicable  INTERVENTION:   -TPN management via pharmacy -RD will follow for diet advancement and supplement as appropriate  NUTRITION DIAGNOSIS:   Inadequate oral intake related to altered GI function as evidenced by NPO status.  GOAL:   Patient will meet greater than or equal to 90% of their needs  MONITOR:   Diet advancement, Labs, Weight trends, TF tolerance, I & O's  REASON FOR ASSESSMENT:   Consult New TPN/TNA  ASSESSMENT:   Pati Lisa Hopkins is a 84 y.o. female with medical history significant for hypertension, history of diabetes now diet controlled, chronic kidney disease stage II, history of CVA, dementia, and atrial fibrillation on warfarin, now presenting to the emergency department for evaluation of upper abdominal pain, nausea, and nonbloody vomiting.  Pt admitted with intractable abdominal pain, nausea, vomiting, and large hiatal hernia.   7/7- s/p PICC line placement, TPN initiated  Per general surgery notes, plan to discuss with family options going forward (considering open g tube and pexy of stomach).   Spoke with pt and two son at bedside. At baseline, pt has a very heart appetite, however, pt has consumed solid foods two saturdays ago (11/24/17). Pt was admitted to the hospital on discharged on a full liquid diet 4 days later, however, pt continued to experience GI distress (per son, pt was feeling worst the night after discharge after eating soup and pudding). Per pt son, pt with inability to keep foods and liquids down over the past 2 weeks ("it just comes right back up"). Pt son reported pt coughing after sipping water prior to RD visit.  Pt and sons deny that pt has lost weight. Noted UBW around 122#. Wt has been stable over the past year. Pt is very active and independent for age; just recently transitioned to ambulating with a walker. They are understanding of NPO status and TPN.   Pt  currently receiving TPN at 8330mL/hr, which provides 795 kcals and 40 grams protein, which meets 55% of estimated kcal needs and 57% of estimated protein needs. Case discussed with pharmacist, who reports that pt will likely advance to goal rate today.   Labs reviewed: CBGS: 124-181 (inpatient orders for glycemic control are 0-9 units insulin aspart every 6 hours).  NUTRITION - FOCUSED PHYSICAL EXAM:    Most Recent Value  Orbital Region  No depletion  Upper Arm Region  No depletion  Thoracic and Lumbar Region  No depletion  Buccal Region  Mild depletion  Temple Region  Mild depletion  Clavicle Bone Region  No depletion  Clavicle and Acromion Bone Region  No depletion  Scapular Bone Region  No depletion  Dorsal Hand  Mild depletion  Patellar Region  Mild depletion  Anterior Thigh Region  No depletion  Posterior Calf Region  Mild depletion  Edema (RD Assessment)  None  Hair  Reviewed  Eyes  Reviewed  Mouth  Reviewed  Skin  Reviewed  Nails  Reviewed       Diet Order:   Diet Order           Diet NPO time specified Except for: Other (See Comments)  Diet effective now          EDUCATION NEEDS:   Education needs have been addressed  Skin:  Skin Assessment: Reviewed RN Assessment  Last BM:  12/01/17  Height:   Ht Readings from Last 1 Encounters:  11/30/17 5\' 5"  (1.651 m)    Weight:  Wt Readings from Last 1 Encounters:  12/03/17 123 lb 14.4 oz (56.2 kg)    Ideal Body Weight:  56.8 kg  BMI:  Body mass index is 20.62 kg/m.  Estimated Nutritional Needs:   Kcal:  1450-1650  Protein:  70-85 grams  Fluid:  > 1.4 L    Anntionette Madkins A. Mayford Knife, RD, LDN, CDE Pager: [phone] After hours Pager: [phone]

## 2017-12-03 NOTE — Progress Notes (Signed)
PROGRESS NOTE    Lisa GalloFrances H Melman  WUJ:[id number] DOB: May 04, 1925 DOA: 11/29/2017 PCP: Lucky CowboyMcKeown, William, MD  Brief Narrative:  Lisa Hopkins is a 84 y.o. female with medical history significant for hypertension, history of diabetes now diet controlled, chronic kidney disease stage II, history of CVA, dementia, and atrial fibrillation on warfarin, now presenting to the emergency department for evaluation of upper abdominal pain, nausea, and nonbloody vomiting.  Patient was admitted to the hospital from 11/25/2017 until 11/29/2017 for the same symptoms, had GI and surgical consultations, underwent CT of the abdomen and pelvis and EGD, and her complaints were attributed to large hiatal hernia.  She improved with medical management during the hospitalization, was discharged in much improved condition, and was planning to follow-up with surgery as an outpatient for possible surgical treatment.  Unfortunately, soon after leaving the hospital, her pain became severe again and she had recurrence and nausea with nonbloody vomiting.  She reports the symptoms to be the same as those during the recent hospitalization, denies fevers or chills, and denies chest pain or shortness of breath.  Her son is at the bedside and assists with the history.  ED Course: Upon arrival to the ED, patient is found to be afebrile, saturating well on room air, mildly hypertensive, and vitals otherwise normal.  Chemistry panel is notable for a potassium of 3.3 and bilirubin 1.6.  CBC is notable for a mild polycythemia.  Patient was given 500 cc normal saline, IV morphine, and IV Zofran in the ED.  She had transient improvement in her symptoms with these measures in the ED, but pain became unbearable and she developed recurrent nausea with vomiting within about an hour.  She will be observed in the hospital for ongoing evaluation and management and may need her surgical follow-up to be done inpatient.      Assessment & Plan:     Principal Problem:   Intractable abdominal pain Active Problems:   Essential hypertension   Atrial fibrillation (HCC)   History of CVA (cerebrovascular accident)   CKD stage 2 due to type 2 diabetes mellitus (HCC)  1-Persistent Nausea vomiting, abdominal pain, Para esophageal Hiatal hernia.  Came back with nausea, vomiting.  Endoscopy 7-3; showed proximal esophageal wed, stomach shows hiatal hernia no ulcer.  -surgery and GI consulted again. Plan for surgery next week.  IV Zofran and phenergan PRN for nausea.  Cardiology consulted for pre op eval. Echo normal EF, mitral and tricuspid regurgitation. Follow cardio rec. Per cardiology if surgery needed, could proceed, patient is optimized cardiac stand point.  Discussed with sx, started  TPN for nutrition.  Awaiting Dr Ezzard StandingNewman eval.   Coffee ground emesis;  Initially on IV Protonix gtt. Now on protonix IV BID>  PRN Zofran for nausea.  IV fluids.  GI notified of readmission and new problem.  KUB negative for obstruction.  If abdominal pain, and vomiting gets worse will need CT abdomen, per GI rec.  HB has remain stable.  No further vomiting.    History of CVA; A fib; will need to be on heparin when INR less than 2.  Now on heparin Gtt  CKD stage 2; stable   Chronic A fib;  On metoprolol at home. IV metoprolol BID>    DVT prophylaxis: SCD Code Status: DNR Family Communication: sons at bedside.  Disposition Plan: to be determine    Consultants:   Surgery   GI    Procedures: none   Antimicrobials:  none   Subjective:  Alert, sitting in recliner. Denies abdominal pain.   Objective: Vitals:   12/02/17 1603 12/02/17 2021 12/03/17 0446 12/03/17 0500  BP: (!) 204/94 (!) 144/75 (!) 150/82   Pulse: 85 (!) 107 91   Resp: 16 18 17    Temp: 98.2 F (36.8 C) 98 F (36.7 C) 97.8 F (36.6 C)   TempSrc: Oral Oral Oral   SpO2: 93% 93% 93%   Weight:    56.2 kg (123 lb 14.4 oz)  Height:        Intake/Output Summary  (Last 24 hours) at 12/03/2017 1256 Last data filed at 12/03/2017 0838 Gross per 24 hour  Intake 1740.41 ml  Output 1000 ml  Net 740.41 ml   Filed Weights   12/01/17 0500 12/02/17 1039 12/03/17 0500  Weight: 56.6 kg (124 lb 12.5 oz) 55.8 kg (123 lb) 56.2 kg (123 lb 14.4 oz)    Examination:  General exam: NAD Respiratory system: CTA Cardiovascular system: S 1, S 2 RRR Gastrointestinal system: BS present, soft, nt Central nervous system: Alert, following command.  Extremities: No edema Skin: No rashes.     Data Reviewed: I have personally reviewed following labs and imaging studies  CBC: Recent Labs  Lab 11/29/17 2248 12/01/17 0732 12/01/17 1550 12/02/17 0612 12/03/17 0340  WBC 9.1 10.8* 12.3* 9.7 8.9  NEUTROABS  --   --   --   --  5.0  HGB 16.6* 12.8 14.0 12.6 12.4  HCT 50.1* 39.6 42.3 38.4 38.4  MCV 94.4 95.9 94.4 95.8 96.7  PLT 236 214 233 201 230   Basic Metabolic Panel: Recent Labs  Lab 11/29/17 2248 11/30/17 0225 12/01/17 0732 12/02/17 0612 12/02/17 1212 12/03/17 0340  NA 136 137 139 137  --  136  K 3.3* 3.2* 3.7 3.4*  --  3.7  CL 95* 98 111 107  --  107  CO2 24 22 23 22   --  25  GLUCOSE 149* 156* 166* 146*  --  108*  BUN 14 14 14  5*  --  6*  CREATININE 0.94 0.89 0.79 0.65  --  0.64  CALCIUM 9.3 9.1 8.3* 8.0*  --  8.2*  MG  --  1.8 1.7  --  1.7 2.2  PHOS  --   --   --   --  2.1* 3.1   GFR: Estimated Creatinine Clearance: 41.5 mL/min (by C-G formula based on SCr of 0.64 mg/dL). Liver Function Tests: Recent Labs  Lab 11/29/17 2248 11/30/17 0225 12/01/17 0732 12/03/17 0340  AST 25 22 16  14*  ALT 17 16 12 11   ALKPHOS 102 98 69 69  BILITOT 1.6* 1.5* 0.8 1.0  PROT 6.9 6.8 4.8* 4.8*  ALBUMIN 3.8 3.6 2.6* 2.5*   Recent Labs  Lab 11/29/17 2248  LIPASE 50   No results for input(s): AMMONIA in the last 168 hours. Coagulation Profile: Recent Labs  Lab 12/01/17 0839 12/01/17 1550 12/01/17 1710 12/02/17 0612 12/03/17 0340  INR 5.35* 1.92  1.80 1.28 1.00   Cardiac Enzymes: No results for input(s): CKTOTAL, CKMB, CKMBINDEX, TROPONINI in the last 168 hours. BNP (last 3 results) No results for input(s): PROBNP in the last 8760 hours. HbA1C: No results for input(s): HGBA1C in the last 72 hours. CBG: Recent Labs  Lab 12/01/17 0806 12/02/17 0809 12/02/17 1759 12/02/17 2322 12/03/17 0447  GLUCAP 137* 136* 181* 152* 124*   Lipid Profile: Recent Labs    12/03/17 0340  TRIG 72   Thyroid Function Tests: No results for input(s):  TSH, T4TOTAL, FREET4, T3FREE, THYROIDAB in the last 72 hours. Anemia Panel: No results for input(s): VITAMINB12, FOLATE, FERRITIN, TIBC, IRON, RETICCTPCT in the last 72 hours. Sepsis Labs: No results for input(s): PROCALCITON, LATICACIDVEN in the last 168 hours.  No results found for this or any previous visit (from the past 240 hour(s)).       Radiology Studies: Dg Chest Port 1 View  Result Date: 12/02/2017 CLINICAL DATA:  Status post peripherally inserted central catheter (PICC) central line placement EXAM: PORTABLE CHEST 1 VIEW COMPARISON:  Chest x-ray dated 11/25/2017. FINDINGS: RIGHT-sided PICC line appears well positioned with tip at the expected level of the lower SVC/cavoatrial junction. Stable cardiomegaly. Probable bibasilar atelectasis and/or small bilateral pleural effusions. No pneumothorax seen. Stable chronic elevation of the LEFT hemidiaphragm. IMPRESSION: 1. PICC line appears appropriately positioned with tip at the expected level of the lower SVC/cavoatrial junction. 2. Probable bibasilar atelectasis and/or small pleural effusions. 3. Stable cardiomegaly. Electronically Signed   By: Bary Richard M.D.   On: 12/02/2017 15:01   [place] Ekg Site Rite  Result Date: 12/02/2017 If Site Rite image not attached, placement could not be confirmed due to current cardiac rhythm.       Scheduled Meds: . cholecalciferol  2,000 Units Oral Daily  .  HYDROmorphone (DILAUDID) injection  0.5 mg  Intravenous Once  . insulin aspart  0-9 Units Subcutaneous Q6H  . mouth rinse  15 mL Mouth Rinse BID  . metoprolol tartrate  5 mg Intravenous Q4H  . pantoprazole  40 mg Intravenous Q12H  . sodium chloride flush  10-40 mL Intracatheter Q12H   Continuous Infusions: . sodium chloride 70 mL/hr at 12/03/17 0607  . sodium chloride    . heparin 850 Units/hr (12/03/17 1031)  . potassium chloride    . TPN ADULT (ION) 30 mL/hr at 12/02/17 1714  . TPN ADULT (ION)       LOS: 3 days    Time spent: 35 minutes.     Alba Cory, MD [facility] Pager [phone]  If 7PM-7AM, please contact night-coverage [email or web address] Password [facility] 12/03/2017, 12:56 PM

## 2017-12-03 NOTE — Progress Notes (Addendum)
[facility] Progress Note     Subjective: CC- hiatal hernia  Patient sitting up in bed, 2 sons at bedside. States that she has no abdominal pain this morning. Passing a small amount of flatus. No n/v since Saturday.  Started on TPN yesterday.  Per cardiology patient is a high risk for perioperative cardiac complications and is at a low functional capacity.  Objective: Vital signs in last 24 hours: Temp:  [97.8 F (36.6 C)-98.2 F (36.8 C)] 97.8 F (36.6 C) (07/08 0446) Pulse Rate:  [78-107] 91 (07/08 0446) Resp:  [16-18] 17 (07/08 0446) BP: (144-204)/(75-102) 150/82 (07/08 0446) SpO2:  [93 %] 93 % (07/08 0446) Weight:  [123 lb (55.8 kg)-123 lb 14.4 oz (56.2 kg)] 123 lb 14.4 oz (56.2 kg) (07/08 0500) Last BM Date: 12/01/17  Intake/Output from previous day: 07/07 0701 - 07/08 0700 In: 2238.1 [I.V.:1799; IV Piggyback:439.1] Out: 700 [Urine:700] Intake/Output this shift: Total I/O In: -  Out: 300 [Urine:300]  PE: Gen: Alert, NAD HEENT: EOM's intact, pupils equal and round Pulm: CTAB, no W/R/R, effort normal Abd: Soft,mild distension, nontender, +BS ZOX:WRUEAV soft and nontender Skin: no rashes noted, warm and dry  Lab Results:  Recent Labs    12/02/17 0612 12/03/17 0340  WBC 9.7 8.9  HGB 12.6 12.4  HCT 38.4 38.4  PLT 201 230   BMET Recent Labs    12/02/17 0612 12/03/17 0340  NA 137 136  K 3.4* 3.7  CL 107 107  CO2 22 25  GLUCOSE 146* 108*  BUN 5* 6*  CREATININE 0.65 0.64  CALCIUM 8.0* 8.2*   PT/INR Recent Labs    12/02/17 0612 12/03/17 0340  LABPROT 15.9* 13.1  INR 1.28 1.00   CMP     Component Value Date/Time   NA 136 12/03/2017 0340   K 3.7 12/03/2017 0340   CL 107 12/03/2017 0340   CO2 25 12/03/2017 0340   GLUCOSE 108 (H) 12/03/2017 0340   BUN 6 (L) 12/03/2017 0340   CREATININE 0.64 12/03/2017 0340   CREATININE 1.03 (H) 11/08/2017 1100   CALCIUM 8.2 (L) 12/03/2017 0340   PROT 4.8 (L) 12/03/2017 0340   ALBUMIN 2.5  (L) 12/03/2017 0340   AST 14 (L) 12/03/2017 0340   ALT 11 12/03/2017 0340   ALKPHOS 69 12/03/2017 0340   BILITOT 1.0 12/03/2017 0340   GFRNONAA >60 12/03/2017 0340   GFRNONAA 48 (L) 11/08/2017 1100   GFRAA >60 12/03/2017 0340   GFRAA 55 (L) 11/08/2017 1100   Lipase     Component Value Date/Time   LIPASE 50 11/29/2017 2248       Studies/Results: Dg Chest Port 1 View  Result Date: 12/02/2017 CLINICAL DATA:  Status post peripherally inserted central catheter (PICC) central line placement EXAM: PORTABLE CHEST 1 VIEW COMPARISON:  Chest x-ray dated 11/25/2017. FINDINGS: RIGHT-sided PICC line appears well positioned with tip at the expected level of the lower SVC/cavoatrial junction. Stable cardiomegaly. Probable bibasilar atelectasis and/or small bilateral pleural effusions. No pneumothorax seen. Stable chronic elevation of the LEFT hemidiaphragm. IMPRESSION: 1. PICC line appears appropriately positioned with tip at the expected level of the lower SVC/cavoatrial junction. 2. Probable bibasilar atelectasis and/or small pleural effusions. 3. Stable cardiomegaly. Electronically Signed   By: Bary Richard M.D.   On: 12/02/2017 15:01   [place] Ekg Site Rite  Result Date: 12/02/2017 If Site Rite image not attached, placement could not be confirmed due to current cardiac rhythm.   Anti-infectives: Anti-infectives (From admission, onward)  None       Assessment/Plan DM HTN CKD-II Afib - on coumadin H/o CVA Code status DNR  Intractable n/v, abd pain Large hiatal hernia  - EGD 7/3 showed web without significant obstruction / PEH without obstruction or ischemia  - readmitted 7/4 with persistent n/v  - not good candidate for hiatal hernia repair  - per cardiology patient is high risk for perioperative cardiac complications and is at a low functional capacity (seen today by Dr. Eloy EndK. Nelson)  ID -none FEN -TNA, sips of clears VTE -SCDs, heparin drip, hold coumadin (INR 1.0  today) Foley -none  Plan - Ok for sips today as long as Ms Ernestina PennaFogleman does not have any n/v.  Continue TNA.   Dr. Ezzard StandingNewman to discuss surgical options with family later today.   LOS: 3 days    Franne FortsBrooke A Meuth , [facility] 12/03/2017, 8:49 AM Pager: [phone] Consults: [phone] Mon 7:00 am -11:30 AM  Agree with above. She has recently moved to [place] -about 4 weeks ago.  Her husband died in February (?)  Two sons - Freida Busmanllen and Rosanne AshingJim at the bedside.  I spent 30+ minutes going over the x-rays and findings with her two sons. Interestingly, she has a CT scan from 2013 that shows almost the same gastric/hiatal anatomy.  I discussed surgery vs medical management.  She has had this hernia for a long time.  It is unclear why it became symptomatic.  They want to try clear liquids again to see how she does.  Will follow.  Continue TPN for now.  Continue IV heparin.  Ovidio Kinavid Ammaar Encina, MD, [facility] Pager: [phone] Office phone:  [phone]

## 2017-12-03 NOTE — Evaluation (Signed)
Physical Therapy Evaluation Patient Details Name: Lisa Hopkins MRN: [id number] DOB: 10-03-1924 Today's Date: 12/03/2017   History of Present Illness  Pt is a 84 y/o female with a PMH significant for HTN, history of diabetes now diet controlled, CKDII, CVA, dementia, and a-fib on warfarin, now presenting to the emergency department for evaluation of upper abdominal pain, nausea, and nonbloody vomiting.  Patient was admitted to the hospital from 11/25/2017 until 11/29/2017 for the same symptoms, underwent CT of the abdomen/pelvis and EGD, and she was found to have a large hiatal hernia. She was discharged in much improved condition, and was planning to follow-up with surgery as an outpatient for possible surgical treatment.  Unfortunately, soon after leaving the hospital, her pain became severe again and she had recurrence and nausea with nonbloody vomiting.   Clinical Impression  Pt admitted with above diagnosis. Pt currently with functional limitations due to the deficits listed below (see PT Problem List). At the time of PT eval pt was able to perform transfers and ambulation with gross min guard assist to min assist for balance support and safety. HHA provided for ambulation in room, however feel a RW would provided more support and decrease risk for falls. Depending on hospital course, feel she is appropriate to return to [facility] with HHPT to follow-up based on today's performance. Acutely, pt will benefit from skilled PT to increase their independence and safety with mobility to allow discharge to the venue listed below.     Follow Up Recommendations Home health PT;Supervision for mobility/OOB(Independent living vs ALF at [facility])    Equipment Recommendations  Rolling walker with 5" wheels    Recommendations for Other Services       Precautions / Restrictions Precautions Precautions: Fall Restrictions Weight Bearing Restrictions: No      Mobility  Bed Mobility Overal bed  mobility: Needs Assistance Bed Mobility: Rolling;Sidelying to Sit Rolling: Min guard Sidelying to sit: Min assist       General bed mobility comments: VC's for sequencing and general safety with log roll. Pt was able to reach for railings for support. Therapist provided assist to elevate trunk to full sitting position.   Transfers Overall transfer level: Needs assistance Equipment used: 1 person hand held assist Transfers: Sit to/from Stand Sit to Stand: Min assist         General transfer comment: Light assist provided for balance as pt powered up to full standing position. Slight unsteadiness noted.   Ambulation/Gait Ambulation/Gait assistance: Min assist;+2 safety/equipment Gait Distance (Feet): 30 Feet(15' x2) Assistive device: 1 person hand held assist Gait Pattern/deviations: Step-through pattern;Decreased stride length;Trunk flexed;Narrow base of support Gait velocity: Decreased Gait velocity interpretation: <1.31 ft/sec, indicative of household ambulator General Gait Details: Pt with HHA on the right, and reaching for external support on the left. Son assisted with IV poles x2 so therapist could stay hands-on with the pt.   Stairs            Wheelchair Mobility    Modified Rankin (Stroke Patients Only)       Balance Overall balance assessment: Needs assistance Sitting-balance support: Feet supported;No upper extremity supported Sitting balance-Leahy Scale: Fair Sitting balance - Comments: required UE support to maintain sitting balance during LE MMT.  Postural control: Posterior lean Standing balance support: Single extremity supported;During functional activity Standing balance-Leahy Scale: Poor Standing balance comment: Reliant on at least 1 UE support for dynamic standing balance  Pertinent Vitals/Pain Pain Assessment: No/denies pain    Home Living Family/patient expects to be discharged to:: Other (Comment)                  Additional Comments: Independent living at [facility]. Has the option for assisted living    Prior Function Level of Independence: Independent with assistive device(s)         Comments: Rollator occasionally and SPC occasionally. Mostly furniture walks per son     Hand Dominance   Dominant Hand: Right    Extremity/Trunk Assessment   Upper Extremity Assessment Upper Extremity Assessment: Defer to OT evaluation    Lower Extremity Assessment Lower Extremity Assessment: Overall WFL for tasks assessed(Age-appropriate strength)    Cervical / Trunk Assessment Cervical / Trunk Assessment: Kyphotic  Communication   Communication: HOH  Cognition Arousal/Alertness: Lethargic;Suspect due to medications Behavior During Therapy: [facility] for tasks assessed/performed Overall Cognitive Status: History of cognitive impairments - at baseline                                 General Comments: Baseline dementia per chart review      General Comments      Exercises     Assessment/Plan    PT Assessment Patient needs continued PT services  PT Problem List Decreased strength;Decreased range of motion;Decreased activity tolerance;Decreased balance;Decreased mobility;Decreased knowledge of use of DME;Decreased safety awareness;Decreased knowledge of precautions       PT Treatment Interventions DME instruction;Gait training;Functional mobility training;Therapeutic activities;Therapeutic exercise;Neuromuscular re-education;Patient/family education    PT Goals (Current goals can be found in the Care Plan section)  Acute Rehab PT Goals Patient Stated Goal: Pt did not express goals during session PT Goal Formulation: With patient/family Time For Goal Achievement: 12/17/17 Potential to Achieve Goals: Good    Frequency Min 3X/week   Barriers to discharge        Co-evaluation               AM-PAC PT "6 Clicks" Daily Activity  Outcome Measure  Difficulty turning over in bed (including adjusting bedclothes, sheets and blankets)?: A Little Difficulty moving from lying on back to sitting on the side of the bed? : Unable Difficulty sitting down on and standing up from a chair with arms (e.g., wheelchair, bedside commode, etc,.)?: Unable Help needed moving to and from a bed to chair (including a wheelchair)?: A Little Help needed walking in hospital room?: A Little Help needed climbing 3-5 steps with a railing? : A Little 6 Click Score: 14    End of Session Equipment Utilized During Treatment: Gait belt Activity Tolerance: Patient tolerated treatment well Patient left: in bed;with call bell/phone within reach;with family/visitor present(Bed alarm unable to be set - RN notified) Nurse Communication: Mobility status PT Visit Diagnosis: Unsteadiness on feet (R26.81);Other abnormalities of gait and mobility (R26.89)    Time: 1610-9604 PT Time Calculation (min) (ACUTE ONLY): 29 min   Charges:   PT Evaluation $PT Eval Moderate Complexity: 1 Mod PT Treatments $Gait Training: 8-22 mins   PT G Codes:        Conni Slipper, PT, DPT Acute Rehabilitation Services Pager: [phone]   Marylynn Pearson 12/03/2017, 3:05 PM

## 2017-12-03 NOTE — Progress Notes (Signed)
PHARMACY - ADULT TOTAL PARENTERAL NUTRITION CONSULT NOTE   Pharmacy Consult:  TPN + Heparin Indication: Large hiatal hernia + Afib  Patient Measurements: Height: 5\' 5"  (165.1 cm) Weight: 123 lb 14.4 oz (56.2 kg) IBW/kg (Calculated) : 57 TPN AdjBW (KG): 55.4 Body mass index is 20.62 kg/m. Usual Weight: 54 kg  Assessment:  89 YOF with PMH significant for HTN, DM-diet controlled, CKD2, CVA, dementia, Afib on Coumadin PTA who had a recent admission 6/30-7/4 for large hiatal hernia. She represents this admission with worsening abdominal pain and persistent nausea and non-bloody vomiting. Patient did have some coffee-ground emesis but this has resolved.   Per son, patient was eating normally without weight loss. Since 6/30, patient was on liquid diet then soft diet but only taking in ~10-20% of her meal trays while admitted. After discharge on 7/4, she was able to eat a tiny amount of butternut squash soup and a few spoonfuls of applesauce x1 occurrence. No further intake since that time.   Pharmacy also managing IV heparin for history of Afib while Coumadin is on hold.  Heparin level is therapeutic and toward the low end of normal.  No bleeding reported.  GI: possible surgery this week. LBM 7/6. PPI IV BID  Endo: diet-controlled DM - CBGs controlled Insulin requirements in the past 24 hours: 2 units Lytes: all WNL Renal: SCr 0.64 stable, BUN WNL - UOP 0.2 ml/kg/hr, NS at 70/hr Pulm: stable on RA Cards: BP/HR high normal-high, EF 65-70% - on IV metoprolol Hepatobil: LFTs / tbili / TG WNL Neuro: CVA, dementia - A&O ID: afebrile, WBC WNL - not on abx Heme: Coumadin PTA for Afib >> Vit K 2.5mg  IV on 7/6 >> Heparin. Coffee-ground emesis is resolved.  HL 0.3, INR 1 TPN Access: PICC placed 12/02/17 TPN start date: 12/02/17  Nutritional Goals (RD rec pending): 1400-1550 kCal and 75-85gm protein per day  Current Nutrition:  TPN   Plan:  Increase TPN to 60 ml/hr.  TPN will provide 80g AA,  245g CHO and 35g ILE for a total of 1500 kCal, meeting 100% of patient needs Electrolytes in TPN: increase lytes d/t increased TPN rate, Cl:Ac 1:1 Daily multivitamin and trace elements in TPN Continue sensitive SSI Q6H Reduce NS to 40 ml/hr when new TPN bag starts.  Watch volume status. KCL x 3 runs F/U AM labs, surgical plans  Increase heparin gtt slightly to 850 units/hr Daily heparin level and CBC D/C daily INR for now   Minervia Osso D. Laney Potashang, PharmD, BCPS, BCCCP 12/03/2017, 9:23 AM

## 2017-12-03 NOTE — Progress Notes (Addendum)
Progress Note  Patient Name: Lisa Hopkins Date of Encounter: 12/03/2017  Primary Cardiologist: Tobias Alexander, MD   Subjective   No discomfort. Weak. Family at bedside  Inpatient Medications    Scheduled Meds: . cholecalciferol  2,000 Units Oral Daily  .  HYDROmorphone (DILAUDID) injection  0.5 mg Intravenous Once  . insulin aspart  0-9 Units Subcutaneous Q6H  . mouth rinse  15 mL Mouth Rinse BID  . metoprolol tartrate  2.5 mg Intravenous Q12H  . pantoprazole  40 mg Intravenous Q12H  . sodium chloride flush  10-40 mL Intracatheter Q12H   Continuous Infusions: . sodium chloride 70 mL/hr at 12/03/17 0607  . sodium chloride    . heparin 800 Units/hr (12/03/17 1610)  . potassium chloride    . TPN ADULT (ION) 30 mL/hr at 12/02/17 1714  . TPN ADULT (ION)     PRN Meds: acetaminophen **OR** acetaminophen, bisacodyl, dicyclomine, hydrALAZINE, morphine injection, ondansetron **OR** ondansetron (ZOFRAN) IV, promethazine, QUEtiapine, senna-docusate   Vital Signs    Vitals:   12/02/17 1603 12/02/17 2021 12/03/17 0446 12/03/17 0500  BP: (!) 204/94 (!) 144/75 (!) 150/82   Pulse: 85 (!) 107 91   Resp: 16 18 17    Temp: 98.2 F (36.8 C) 98 F (36.7 C) 97.8 F (36.6 C)   TempSrc: Oral Oral Oral   SpO2: 93% 93% 93%   Weight:    123 lb 14.4 oz (56.2 kg)  Height:        Intake/Output Summary (Last 24 hours) at 12/03/2017 1011 Last data filed at 12/03/2017 0838 Gross per 24 hour  Intake 1886.78 ml  Output 1000 ml  Net 886.78 ml   Filed Weights   12/01/17 0500 12/02/17 1039 12/03/17 0500  Weight: 124 lb 12.5 oz (56.6 kg) 123 lb (55.8 kg) 123 lb 14.4 oz (56.2 kg)    Telemetry    Not on tele   ECG    No new EKG  Physical Exam   GEN: No acute distress.   Neck: No JVD Cardiac: irregularly irregular, no murmurs, rubs, or gallops.  Respiratory: Clear to auscultation bilaterally. GI: Soft, nontender, non-distended  MS: No edema; No deformity. Neuro:  Nonfocal    Psych: Normal affect   Labs    Chemistry Recent Labs  Lab 11/30/17 0225 12/01/17 0732 12/02/17 0612 12/03/17 0340  NA 137 139 137 136  K 3.2* 3.7 3.4* 3.7  CL 98 111 107 107  CO2 22 23 22 25   GLUCOSE 156* 166* 146* 108*  BUN 14 14 5* 6*  CREATININE 0.89 0.79 0.65 0.64  CALCIUM 9.1 8.3* 8.0* 8.2*  PROT 6.8 4.8*  --  4.8*  ALBUMIN 3.6 2.6*  --  2.5*  AST 22 16  --  14*  ALT 16 12  --  11  ALKPHOS 98 69  --  69  BILITOT 1.5* 0.8  --  1.0  GFRNONAA 55* >60 >60 >60  GFRAA >60 >60 >60 >60  ANIONGAP 17* 5 8 4*     Hematology Recent Labs  Lab 12/01/17 1550 12/02/17 0612 12/03/17 0340  WBC 12.3* 9.7 8.9  RBC 4.48 4.01 3.97  HGB 14.0 12.6 12.4  HCT 42.3 38.4 38.4  MCV 94.4 95.8 96.7  MCH 31.3 31.4 31.2  MCHC 33.1 32.8 32.3  RDW 13.0 13.0 13.1  PLT 233 201 230    Cardiac Enzymes Recent Labs  Lab 11/26/17 1230  TROPONINI <0.03   No results for input(s): TROPIPOC in the last  168 hours.   BNPNo results for input(s): BNP, PROBNP in the last 168 hours.   DDimer No results for input(s): DDIMER in the last 168 hours.   Radiology    Dg Chest Port 1 View  Result Date: 12/02/2017 CLINICAL DATA:  Status post peripherally inserted central catheter (PICC) central line placement EXAM: PORTABLE CHEST 1 VIEW COMPARISON:  Chest x-ray dated 11/25/2017. FINDINGS: RIGHT-sided PICC line appears well positioned with tip at the expected level of the lower SVC/cavoatrial junction. Stable cardiomegaly. Probable bibasilar atelectasis and/or small bilateral pleural effusions. No pneumothorax seen. Stable chronic elevation of the LEFT hemidiaphragm. IMPRESSION: 1. PICC line appears appropriately positioned with tip at the expected level of the lower SVC/cavoatrial junction. 2. Probable bibasilar atelectasis and/or small pleural effusions. 3. Stable cardiomegaly. Electronically Signed   By: Bary Richard M.D.   On: 12/02/2017 15:01   [place] Ekg Site Rite  Result Date: 12/02/2017 If Site Rite  image not attached, placement could not be confirmed due to current cardiac rhythm.   Cardiac Studies   Echo 12/01/2017 LV EF: 65% -   70%  ------------------------------------------------------------------- Indications:      Abnormal EKG 794.31.  ------------------------------------------------------------------- History:   PMH:   Atrial fibrillation.  Risk factors:  History of stroke. Chronic kidney disease. Hypertension.  ------------------------------------------------------------------- Study Conclusions  - Left ventricle: The cavity size was normal. Wall thickness was   normal. Systolic function was vigorous. The estimated ejection   fraction was in the range of 65% to 70%. Wall motion was normal;   there were no regional wall motion abnormalities. The study is   not technically sufficient to allow evaluation of LV diastolic   function. - Aortic valve: Moderately calcified annulus. Trileaflet; mildly   thickened, mildly calcified leaflets. Morphologically, there may   be a mild degree of aortic stenosis. There was trivial   regurgitation. - Mitral valve: Mildly calcified annulus. Normal thickness leaflets   . There was mild to moderate regurgitation. - Left atrium: The atrium was moderately dilated. - Right atrium: The atrium was moderately dilated. - Atrial septum: No defect or patent foramen ovale was identified. - Tricuspid valve: There was moderate-severe regurgitation. - Pulmonic valve: There was mild regurgitation. - Pulmonary arteries: Systolic pressure was mildly increased. PA   peak pressure: 37 mm Hg (S).  Patient Profile     84 y.o. female presented with coffee ground emesis. Seen by cardiology for preop eval. Pending family decision prior to abdominal surgery.   Assessment & Plan    1. Preoperative clearance: had coffee ground emesis, possible surgery. Large hiatal hernia.   - seen by Dr. Cristal Deer, "Preop risk: RCRI of greater than or equal to 3 is  11% perioperative risk. She has a history of CVA, prior diastolic CHF per Dr. Lindaann Slough notes (though no evidence today), and this is a high risk potential surgery. The patient is at high risk for perioperative cardiac complications and is at a low functional capacity.  However, further testing will not change how her cardiac status is managed. We will order an echocardiogram to assess for any changes in her cardiac function, though she is currently euvolemic and asymptomatic. Proceed with surgery at high risk if there are no other options for treatment."  - Echo 12/01/2017 normal EF, 65-70%, mild to moderate MR, mild AS, moderate to severe TR, PA peak pressure 37 mmHg  - no change to previous assessment. Pending decision on surgery  2. Chronic atrial fibrillation: rate controlled on IV lopressor.  Bridged via Heparin IV. Off coumadin pending possible surgery.   3. H/o CVA 2010  4. Chronic diastolic HF: euvolemic on exam, no peripheral swelling despite moderate to severe TR  5. HTN: mildly elevated SBP 140-150s, off losartan. No oral meds  6. HLD  7. DM on insulin  8. Hypokalemia  For questions or updates, please contact [facility] Please consult [email or web address] for contact info under Cardiology/STEMI.     Ramond DialSigned, Hao Meng, PA  12/03/2017, 10:11 AM     The patient was seen, examined and discussed with Azalee CourseHao Meng, PA-C and I agree with the above.   Echo shows normal LVEF, mild pulmonary hypertension,  mild to moderate MR, mild AS, moderate to severe TR. She is euvolemic on physical exam, I would increase metoprolol iv to 5 mg Q4H for better HR and BP control. Continue iv Heparin for anticoagulation. If surgery needed, you should proceed, her risk is base on age and co morbidities, however currently well optimized from cardiac standpoint. No further cardiac workup is needed prior to surgery.   Tobias AlexanderKatarina Saoirse Legere, MD 12/03/2017

## 2017-12-04 LAB — CBC
HCT: 38.5 % (ref 36.0–46.0)
Hemoglobin: 12.3 g/dL (ref 12.0–15.0)
MCH: 31.1 pg (ref 26.0–34.0)
MCHC: 31.9 g/dL (ref 30.0–36.0)
MCV: 97.2 fL (ref 78.0–100.0)
Platelets: 229 10*3/uL (ref 150–400)
RBC: 3.96 MIL/uL (ref 3.87–5.11)
RDW: 12.8 % (ref 11.5–15.5)
WBC: 7 10*3/uL (ref 4.0–10.5)

## 2017-12-04 LAB — GLUCOSE, CAPILLARY
GLUCOSE-CAPILLARY: 162 mg/dL — AB (ref 70–99)
Glucose-Capillary: 150 mg/dL — ABNORMAL HIGH (ref 70–99)
Glucose-Capillary: 152 mg/dL — ABNORMAL HIGH (ref 70–99)
Glucose-Capillary: 167 mg/dL — ABNORMAL HIGH (ref 70–99)

## 2017-12-04 LAB — MAGNESIUM: MAGNESIUM: 1.9 mg/dL (ref 1.7–2.4)

## 2017-12-04 LAB — BASIC METABOLIC PANEL
Anion gap: 6 (ref 5–15)
BUN: 11 mg/dL (ref 8–23)
CALCIUM: 8.2 mg/dL — AB (ref 8.9–10.3)
CO2: 25 mmol/L (ref 22–32)
CREATININE: 0.62 mg/dL (ref 0.44–1.00)
Chloride: 105 mmol/L (ref 98–111)
GFR calc non Af Amer: >60 mL/min (ref >60)
Glucose, Bld: 136 mg/dL — ABNORMAL HIGH (ref 70–99)
Potassium: 3.9 mmol/L (ref 3.5–5.1)
SODIUM: 136 mmol/L (ref 135–145)

## 2017-12-04 LAB — PHOSPHORUS: Phosphorus: 3.4 mg/dL (ref 2.5–4.6)

## 2017-12-04 LAB — HEPARIN LEVEL (UNFRACTIONATED): HEPARIN UNFRACTIONATED: 0.36 [IU]/mL (ref 0.30–0.70)

## 2017-12-04 MED ORDER — LOSARTAN POTASSIUM 50 MG PO TABS
50.0000 mg | ORAL_TABLET | Freq: Every day | ORAL | Status: DC
  Administered 2017-12-04 – 2017-12-13 (×10): 50 mg via ORAL
  Filled 2017-12-04 (×10): qty 1

## 2017-12-04 MED ORDER — TRAVASOL 10 % IV SOLN
INTRAVENOUS | Status: AC
  Administered 2017-12-04: 18:00:00 via INTRAVENOUS
  Filled 2017-12-04: qty 806.4

## 2017-12-04 NOTE — Progress Notes (Signed)
ANTICOAGULATION CONSULT NOTE - Follow Up Consult  Pharmacy Consult for heparin  Indication: atrial fibrillation and pulmonary embolus  Allergies  Allergen Reactions  . Ace Inhibitors Other (See Comments)    Cough  . Fosamax [Alendronate Sodium] Other (See Comments)    Heart burn  . Norvasc [Amlodipine Besylate] Other (See Comments)    edema  . Zocor [Simvastatin] Other (See Comments)    Elevated CPK    Patient Measurements: Height: 5\' 5"  (165.1 cm) Weight: 129 lb 3 oz (58.6 kg) IBW/kg (Calculated) : 57   Vital Signs: Temp: 98 F (36.7 C) (07/09 0559) Temp Source: Oral (07/09 0559) BP: 151/81 (07/09 0559) Pulse Rate: 57 (07/09 0559)  Labs: Recent Labs    12/01/17 1710  12/02/17 0612 12/02/17 1524 12/03/17 0340 12/04/17 0358  HGB  --   --  12.6  --  12.4 12.3  HCT  --   --  38.4  --  38.4 38.5  PLT  --   --  201  --  230 229  LABPROT 20.7*  --  15.9*  --  13.1  --   INR 1.80  --  1.28  --  1.00  --   HEPARINUNFRC  --    < > 0.11* 0.32 0.30 0.36  CREATININE  --   --  0.65  --  0.64 0.62   < > = values in this interval not displayed.    Estimated Creatinine Clearance: 42.1 mL/min (by C-G formula based on SCr of 0.62 mg/dL).  Assessment: 84 yo female with history of afib on warfarin PTA- currently on hold as patient with N/V and recent coffee ground emesis with no bleeding noted. GI ok with heparin.  Heparin level within goal 0.36  Goal of Therapy:  Heparin level 0.3-0.5 units/ml Monitor platelets by anticoagulation protocol: Yes   Plan:  Continue heparin 850units/hr Daily heparin level and CBC Follow for s/s bleeding, especially GI bleeding   Thank you Okey RegalLisa Shamon Cothran, PharmD [phone] Please check AMION for all [facility] numbers  12/04/2017 8:28 AM

## 2017-12-04 NOTE — Care Management Important Message (Signed)
Important Message  Patient Details  Name: Lisa Hopkins MRN: [id number] Date of Birth: [birth year]   Medicare Important Message Given:  Yes    Evaleen Sant 12/04/2017, 2:18 PM

## 2017-12-04 NOTE — Social Work (Signed)
CSW aware pt may be requiring more assistance than can be provided at present in Independent Living. CSW has attempted to call [facility] to discuss this process x2. Will attempt again later this afternoon.   Lisa HutchingIsabel H Cleotilde Spadaccini, LCSWA [facility] Clinical Social Work [phone]

## 2017-12-04 NOTE — Progress Notes (Addendum)
[facility] Progress Note     Subjective: CC-  Sitting up in chair, son at bedside. No complaints. Denies abdominal pain, n/v. She had a couple good BMs yesterday. Tolerating water, waiting CLD breakfast tray.  Objective: Vital signs in last 24 hours: Temp:  [97.4 F (36.3 C)-98.3 F (36.8 C)] 98 F (36.7 C) (07/09 0559) Pulse Rate:  [57-66] 57 (07/09 0559) Resp:  [16-17] 16 (07/09 0559) BP: (151-175)/(81-85) 151/81 (07/09 0559) SpO2:  [94 %-96 %] 94 % (07/09 0559) Weight:  [129 lb 3 oz (58.6 kg)] 129 lb 3 oz (58.6 kg) (07/09 0500) Last BM Date: 12/03/17  Intake/Output from previous day: 07/08 0701 - 07/09 0700 In: 1516.3 [P.O.:60; I.V.:1456.3] Out: 800 [Urine:800] Intake/Output this shift: No intake/output data recorded.  PE: Gen: Alert, NAD HEENT: EOM's intact, pupils equal and round Pulm: CTAB, no W/R/R, effort normal Abd: Soft,mild distension, nontender, +BS ZOX:WRUEAVExt:Calves soft and nontender Skin: no rashes noted, warm and dry  Lab Results:  Recent Labs    12/03/17 0340 12/04/17 0358  WBC 8.9 7.0  HGB 12.4 12.3  HCT 38.4 38.5  PLT 230 229   BMET Recent Labs    12/03/17 0340 12/04/17 0358  NA 136 136  K 3.7 3.9  CL 107 105  CO2 25 25  GLUCOSE 108* 136*  BUN 6* 11  CREATININE 0.64 0.62  CALCIUM 8.2* 8.2*   PT/INR Recent Labs    12/02/17 0612 12/03/17 0340  LABPROT 15.9* 13.1  INR 1.28 1.00   CMP     Component Value Date/Time   NA 136 12/04/2017 0358   K 3.9 12/04/2017 0358   CL 105 12/04/2017 0358   CO2 25 12/04/2017 0358   GLUCOSE 136 (H) 12/04/2017 0358   BUN 11 12/04/2017 0358   CREATININE 0.62 12/04/2017 0358   CREATININE 1.03 (H) 11/08/2017 1100   CALCIUM 8.2 (L) 12/04/2017 0358   PROT 4.8 (L) 12/03/2017 0340   ALBUMIN 2.5 (L) 12/03/2017 0340   AST 14 (L) 12/03/2017 0340   ALT 11 12/03/2017 0340   ALKPHOS 69 12/03/2017 0340   BILITOT 1.0 12/03/2017 0340   GFRNONAA >60 12/04/2017 0358   GFRNONAA 48 (L)  11/08/2017 1100   GFRAA >60 12/04/2017 0358   GFRAA 55 (L) 11/08/2017 1100   Lipase     Component Value Date/Time   LIPASE 50 11/29/2017 2248       Studies/Results: Dg Chest Port 1 View  Result Date: 12/02/2017 CLINICAL DATA:  Status post peripherally inserted central catheter (PICC) central line placement EXAM: PORTABLE CHEST 1 VIEW COMPARISON:  Chest x-ray dated 11/25/2017. FINDINGS: RIGHT-sided PICC line appears well positioned with tip at the expected level of the lower SVC/cavoatrial junction. Stable cardiomegaly. Probable bibasilar atelectasis and/or small bilateral pleural effusions. No pneumothorax seen. Stable chronic elevation of the LEFT hemidiaphragm. IMPRESSION: 1. PICC line appears appropriately positioned with tip at the expected level of the lower SVC/cavoatrial junction. 2. Probable bibasilar atelectasis and/or small pleural effusions. 3. Stable cardiomegaly. Electronically Signed   By: Bary RichardStan  Maynard M.D.   On: 12/02/2017 15:01   [place] Ekg Site Rite  Result Date: 12/02/2017 If Site Rite image not attached, placement could not be confirmed due to current cardiac rhythm.   Anti-infectives: Anti-infectives (From admission, onward)   None       Assessment/Plan DM HTN CKD-II Afib - on coumadin H/o CVA Code status DNR  Intractable n/v, abd pain Large hiatal hernia             -  EGD7/3showed web without significant obstruction / PEH without obstruction or ischemia             -readmitted 7/4 with persistent n/v             - not good candidate for hiatal hernia repair             - per cardiology patient is high risk for perioperative cardiac complications and is at a low functional capacity  ID -none FEN -TNA, CLD VTE -SCDs,heparin drip, holdcoumadin Foley -none  Plan -Attempting nonoperative management.   Clear liquids today, plan to advance as tolerated. If she fails again will discuss surgery.   LOS: 4 days    Franne Forts ,  [facility] 12/04/2017, 9:37 AM Pager: [phone] Consults: [phone]  Agree with above.  She's had a little liquids.  So far is doing okay. She is in the room by herself.  Ovidio Kin, MD, [facility] Pager: [phone] Office phone:  [phone]

## 2017-12-04 NOTE — Progress Notes (Signed)
PROGRESS NOTE    Lisa Hopkins  ZOX:[id number] DOB: 03-19-1925 DOA: 11/29/2017 PCP: Lucky Cowboy, MD  Brief Narrative:  Lisa Hopkins is a 84 y.o. female with medical history significant for hypertension, history of diabetes now diet controlled, chronic kidney disease stage II, history of CVA, dementia, and atrial fibrillation on warfarin, now presenting to the emergency department for evaluation of upper abdominal pain, nausea, and nonbloody vomiting.  Patient was admitted to the hospital from 11/25/2017 until 11/29/2017 for the same symptoms, had GI and surgical consultations, underwent CT of the abdomen and pelvis and EGD, and her complaints were attributed to large hiatal hernia.  She improved with medical management during the hospitalization, was discharged in much improved condition, and was planning to follow-up with surgery as an outpatient for possible surgical treatment.  Unfortunately, soon after leaving the hospital, her pain became severe again and she had recurrence and nausea with nonbloody vomiting.  She reports the symptoms to be the same as those during the recent hospitalization, denies fevers or chills, and denies chest pain or shortness of breath.  Her son is at the bedside and assists with the history.  ED Course: Upon arrival to the ED, patient is found to be afebrile, saturating well on room air, mildly hypertensive, and vitals otherwise normal.  Chemistry panel is notable for a potassium of 3.3 and bilirubin 1.6.  CBC is notable for a mild polycythemia.  Patient was given 500 cc normal saline, IV morphine, and IV Zofran in the ED.  She had transient improvement in her symptoms with these measures in the ED, but pain became unbearable and she developed recurrent nausea with vomiting within about an hour.    Admitted with recurrent nausea, vomiting, abdominal pain 24 hours after recent admission. Symptoms are thought to be related to large hiatal hernia. She was  evaluated by cardiology who thought patient was high risk, but ok to proceed with surgery if needed. She was evaluated by surgery, Dr Ezzard Standing wants to give another trial of clears, if patient fail she might need sx.       Assessment & Plan:   Principal Problem:   Intractable abdominal pain Active Problems:   Essential hypertension   Atrial fibrillation (HCC)   History of CVA (cerebrovascular accident)   CKD stage 2 due to type 2 diabetes mellitus (HCC)  1-Persistent Nausea vomiting, abdominal pain, Para esophageal Hiatal hernia.  Came back with nausea, vomiting.  -Endoscopy 7-3; showed proximal esophageal wed, stomach shows hiatal hernia no ulcer.  -surgery and GI consulted again. Plan for surgery next week.  -IV Zofran and phenergan PRN for nausea.  Cardiology consulted for pre op eval. Echo normal EF, mitral and tricuspid regurgitation. Follow cardio rec. Per cardiology if surgery needed, could proceed, patient is optimized cardiac stand point.  Discussed with sx, started  TPN for nutrition.  Plan to try clear diet.  Surgery following.   Coffee ground emesis;  Initially on IV Protonix gtt. Now on protonix IV BID>  PRN Zofran for nausea.  IV fluids.  GI notified of readmission and new problem.  KUB negative for obstruction.  If abdominal pain, and vomiting gets worse will need CT abdomen, per GI rec.  HB has remain stable.  No further vomiting.    History of CVA; A fib; will need to be on heparin when INR less than 2.  Now on heparin Gtt  CKD stage 2; stable   Chronic A fib;  On metoprolol at home.  IV metoprolol BID>   HTN; continue with PRN hydralazine. Marland Kitchen. Resume cozaar now she has not been vomiting.    DVT prophylaxis: SCD Code Status: DNR Family Communication: sons at bedside.  Disposition Plan: to be determine    Consultants:   Surgery   GI    Procedures: none   Antimicrobials:  none   Subjective: She is alert, in no distress. No further  vomiting.   Objective: Vitals:   12/04/17 0500 12/04/17 0559 12/04/17 1216 12/04/17 1339  BP:  (!) 151/81 (!) 177/93 (!) 188/115  Pulse:  (!) 57 71 72  Resp:  16    Temp:  98 F (36.7 C)  98.1 F (36.7 C)  TempSrc:  Oral  Oral  SpO2:  94%  96%  Weight: 58.6 kg (129 lb 3 oz)     Height:        Intake/Output Summary (Last 24 hours) at 12/04/2017 1351 Last data filed at 12/04/2017 0630 Gross per 24 hour  Intake 1516.32 ml  Output 500 ml  Net 1016.32 ml   Filed Weights   12/02/17 1039 12/03/17 0500 12/04/17 0500  Weight: 55.8 kg (123 lb) 56.2 kg (123 lb 14.4 oz) 58.6 kg (129 lb 3 oz)    Examination:  General exam: NAD Respiratory system: CTA Cardiovascular system: S 1, S 2 RRR Gastrointestinal system: BS present, soft, nt Central nervous system: Alert, non focal.  Extremities: No edema Skin: No rashes.     Data Reviewed: I have personally reviewed following labs and imaging studies  CBC: Recent Labs  Lab 12/01/17 0732 12/01/17 1550 12/02/17 0612 12/03/17 0340 12/04/17 0358  WBC 10.8* 12.3* 9.7 8.9 7.0  NEUTROABS  --   --   --  5.0  --   HGB 12.8 14.0 12.6 12.4 12.3  HCT 39.6 42.3 38.4 38.4 38.5  MCV 95.9 94.4 95.8 96.7 97.2  PLT 214 233 201 230 229   Basic Metabolic Panel: Recent Labs  Lab 11/30/17 0225 12/01/17 0732 12/02/17 0612 12/02/17 1212 12/03/17 0340 12/04/17 0358  NA 137 139 137  --  136 136  K 3.2* 3.7 3.4*  --  3.7 3.9  CL 98 111 107  --  107 105  CO2 22 23 22   --  25 25  GLUCOSE 156* 166* 146*  --  108* 136*  BUN 14 14 5*  --  6* 11  CREATININE 0.89 0.79 0.65  --  0.64 0.62  CALCIUM 9.1 8.3* 8.0*  --  8.2* 8.2*  MG 1.8 1.7  --  1.7 2.2 1.9  PHOS  --   --   --  2.1* 3.1 3.4   GFR: Estimated Creatinine Clearance: 42.1 mL/min (by C-G formula based on SCr of 0.62 mg/dL). Liver Function Tests: Recent Labs  Lab 11/29/17 2248 11/30/17 0225 12/01/17 0732 12/03/17 0340  AST 25 22 16  14*  ALT 17 16 12 11   ALKPHOS 102 98 69 69    BILITOT 1.6* 1.5* 0.8 1.0  PROT 6.9 6.8 4.8* 4.8*  ALBUMIN 3.8 3.6 2.6* 2.5*   Recent Labs  Lab 11/29/17 2248  LIPASE 50   No results for input(s): AMMONIA in the last 168 hours. Coagulation Profile: Recent Labs  Lab 12/01/17 0839 12/01/17 1550 12/01/17 1710 12/02/17 0612 12/03/17 0340  INR 5.35* 1.92 1.80 1.28 1.00   Cardiac Enzymes: No results for input(s): CKTOTAL, CKMB, CKMBINDEX, TROPONINI in the last 168 hours. BNP (last 3 results) No results for input(s): PROBNP in the last  8760 hours. HbA1C: No results for input(s): HGBA1C in the last 72 hours. CBG: Recent Labs  Lab 12/03/17 1217 12/03/17 1813 12/04/17 0005 12/04/17 0555 12/04/17 1236  GLUCAP 134* 132* 167* 152* 162*   Lipid Profile: Recent Labs    12/03/17 0340  TRIG 72   Thyroid Function Tests: No results for input(s): TSH, T4TOTAL, FREET4, T3FREE, THYROIDAB in the last 72 hours. Anemia Panel: No results for input(s): VITAMINB12, FOLATE, FERRITIN, TIBC, IRON, RETICCTPCT in the last 72 hours. Sepsis Labs: No results for input(s): PROCALCITON, LATICACIDVEN in the last 168 hours.  No results found for this or any previous visit (from the past 240 hour(s)).       Radiology Studies: Dg Chest Port 1 View  Result Date: 12/02/2017 CLINICAL DATA:  Status post peripherally inserted central catheter (PICC) central line placement EXAM: PORTABLE CHEST 1 VIEW COMPARISON:  Chest x-ray dated 11/25/2017. FINDINGS: RIGHT-sided PICC line appears well positioned with tip at the expected level of the lower SVC/cavoatrial junction. Stable cardiomegaly. Probable bibasilar atelectasis and/or small bilateral pleural effusions. No pneumothorax seen. Stable chronic elevation of the LEFT hemidiaphragm. IMPRESSION: 1. PICC line appears appropriately positioned with tip at the expected level of the lower SVC/cavoatrial junction. 2. Probable bibasilar atelectasis and/or small pleural effusions. 3. Stable cardiomegaly.  Electronically Signed   By: Bary Richard M.D.   On: 12/02/2017 15:01        Scheduled Meds: . cholecalciferol  2,000 Units Oral Daily  .  HYDROmorphone (DILAUDID) injection  0.5 mg Intravenous Once  . insulin aspart  0-9 Units Subcutaneous Q6H  . losartan  50 mg Oral Daily  . mouth rinse  15 mL Mouth Rinse BID  . metoprolol tartrate  5 mg Intravenous Q4H  . pantoprazole  40 mg Intravenous Q12H  . sodium chloride flush  10-40 mL Intracatheter Q12H   Continuous Infusions: . heparin 850 Units/hr (12/04/17 1222)  . TPN ADULT (ION) 60 mL/hr at 12/04/17 0630  . TPN ADULT (ION)       LOS: 4 days    Time spent: 35 minutes.     Alba Cory, MD [facility] Pager [phone]  If 7PM-7AM, please contact night-coverage [email or web address] Password [email or web address] 12/04/2017, 1:51 PM

## 2017-12-04 NOTE — Progress Notes (Signed)
MD notified of patients BP 188/115. PRN Apresoline administered. Will continue to monitor.

## 2017-12-04 NOTE — Progress Notes (Signed)
PHARMACY - ADULT TOTAL PARENTERAL NUTRITION CONSULT NOTE   Pharmacy Consult:  TPN  Indication: Large hiatal hernia  Patient Measurements: Height: 5\' 5"  (165.1 cm) Weight: 129 lb 3 oz (58.6 kg) IBW/kg (Calculated) : 57 TPN AdjBW (KG): 55.4 Body mass index is 21.5 kg/m. Usual Weight: 54 kg  Assessment:  89 YOF with PMH significant for HTN, DM-diet controlled, CKD2, CVA, dementia, Afib on Coumadin PTA who had a recent admission 6/30-7/4 for large hiatal hernia. She represents this admission with worsening abdominal pain and persistent nausea and non-bloody vomiting. Patient did have some coffee-ground emesis but this has resolved.   Per son, patient was eating normally without weight loss. Since 6/30, patient was on liquid diet then soft diet but only taking in ~10-20% of her meal trays while admitted. After discharge on 7/4, she was able to eat a tiny amount of butternut squash soup and a few spoonfuls of applesauce x1 occurrence. No further intake since that time.   GI: possible surgery this week. LBM 7/6. PPI IV BID, PRN Phenergan Endo: diet-controlled DM - CBGs controlled Insulin requirements in the past 24 hours: 5 units SSI Lytes: all WNL Renal: SCr 0.64 stable, BUN WNL - UOP 0.9 ml/kg/hr, NS at 40/hr, net +7.5L  Pulm: stable on RA Cards: BP/HR high normal-high, HR trending down, EF 65-70% - on IV metoprolol increased Hepatobil: LFTs / tbili / TG WNL Neuro: CVA, dementia - A&O ID: afebrile, WBC WNL - not on abx Heme: Coumadin PTA for Afib >> Vit K 2.5mg  IV on 7/6 >> Heparin. Coffee-ground emesis is resolved.  HL therapeutic TPN Access: PICC placed 12/02/17 TPN start date: 12/02/17   Nutritional Goals (RD rec pending): 1400-1550 kCal and 75-85gm protein per day  Current Nutrition:  TPN Clear liquid diet started 7/8 PM   Plan:  Continue TPN at 60 ml/hr, providing 80g AA, 245g CHO and 35g ILE for a total of 1500 kCal, meeting 100% of patient needs Electrolytes in TPN: increase  Mag slightly, Cl:Ac 1:1 Daily multivitamin and trace elements in TPN Continue sensitive SSI Q6H Standard TPN labs on Thurs F/U surgical plans, PO intake/diet advancement to start weaning TPN NS at 40 ml/hr per MD.  [facility].   Kelilah Hebard D. Laney Potashang, PharmD, BCPS, BCCCP 12/04/2017, 9:23 AM

## 2017-12-04 NOTE — Progress Notes (Signed)
PT Cancellation Note  Patient Details Name: Lisa GalloFrances H Lehrman MRN: [id number] DOB: [birth year]   Cancelled Treatment:    Reason Eval/Treat Not Completed: Medical issues which prohibited therapy. Pt nauseated and with abdominal pain. She is currently asleep after taking anti-nausea medication. Noted BP has been elevated this afternoon as well, and will hold PT at this time. Will check back at the next available time to continue with PT POC.    Marylynn PearsonLaura D Milburn Freeney 12/04/2017, 2:39 PM  Conni SlipperLaura Marcayla Budge, PT, DPT Acute Rehabilitation Services Pager: [phone]

## 2017-12-04 NOTE — Clinical Social Work Note (Signed)
Clinical Social Work Assessment  Patient Details  Name: Lisa Hopkins MRN: [id number] Date of Birth: 1924/08/03  Date of referral:  12/04/17               Reason for consult:  Facility Placement, Discharge Planning                Permission sought to share information with:  Facility Sport and exercise psychologist, Family Supports Permission granted to share information::  Yes, Verbal Permission Granted  Name::     Deriana Vanderhoef and [place]::     Relationship::  sons  Contact Information:   [phone] and [phone]  Housing/Transportation Living arrangements for the past 2 months:  [place] of Information:  Patient, Adult Children Patient Interpreter Needed:  None Criminal Activity/Legal Involvement Pertinent to Current Situation/Hospitalization:  No - Comment as needed Significant Relationships:  Adult Children, Warehouse manager, Other Family Members, Friend Lives with:  Self, Facility Resident Do you feel safe going back to the place where you live?  Yes Need for family participation in patient care:  Yes (Comment)  Care giving concerns:  Pt is currently under conservative measures to see if she will eventually need surgery. Pt is weak and home health is currently being recommended. Pt is from independent living but curious about the feasibility of returning home alone.    Social Worker assessment / plan:  CSW met with pt at bedside, pt friend Enid Derry also at bedside. Pt states that she has been residing at [facility] prior to coming to hospital. 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 Pt has two sons that live in the [place] area and are available to support pt with decision making and care.   CSW then met with pt son Dub Mikes confirmed that pt has come from [place]. Pt has lived there for four weeks and pt son has interest in her being able to return, but wants her to be  safe. CSW prior to assessment had called [facility] to discuss options. CSW explained to pt son that at current time the recommendations are for pt to be able to return to [place] with home health. There is also the option for pt to be part of the "Living Well at Home" program, which supports [facility] residents with remaining in their independent living environment. There are currently no ALF openings at [facility].   Pt son states understanding, would be interested in programming options and services provided at [facility]. CSW will follow along, if plan remains the same RN Case Manager will arrange home health, but if not then CSW will support with other disposition plans. This all may be dependent on whether or not pt will have surgical intervention.   Continuing to follow.   Employment status:  Retired Nurse, adult PT Recommendations:  Home with [place], [place] / Referral to community resources:  Other (Comment Required)(additional supports at ILF)  Patient/Family's Response to care:  Pt and pt son were both amenable to [facility] visit, understand recommendations at current time and that CSW and care management teams will follow along.   Patient/Family's Understanding of and Emotional Response to Diagnosis, Current Treatment, and Prognosis:  *Pt and pt son state understanding of diagnosis, current treatment and prognosis. Pt son expresses some worry at the thought of surgical intervention, given pt's advanced age. Pt son expressed understanding of pt's limitations and that pt may require  more services going forward. Pt son open to further support from [facility].   Emotional Assessment Appearance:  Appears stated age Attitude/Demeanor/Rapport:  Engaged, Gracious Affect (typically observed):  Accepting, Adaptable, Appropriate, Quiet, Pleasant Orientation:  Oriented to Situation, Oriented to  Time, Oriented to Place, Oriented to Self Alcohol /  Substance use:  Not Applicable Psych involvement (Current and /or in the community):  No (Comment)  Discharge Needs  Concerns to be addressed:  Care Coordination, Discharge Planning Concerns Readmission within the last 30 days:  No Current discharge risk:  Lives alone, Physical Impairment Barriers to Discharge:  Continued Medical Work up   [employer], [place] 12/04/2017, 2:55 PM

## 2017-12-05 LAB — GLUCOSE, CAPILLARY
GLUCOSE-CAPILLARY: 137 mg/dL — AB (ref 70–99)
GLUCOSE-CAPILLARY: 152 mg/dL — AB (ref 70–99)
Glucose-Capillary: 145 mg/dL — ABNORMAL HIGH (ref 70–99)
Glucose-Capillary: 151 mg/dL — ABNORMAL HIGH (ref 70–99)

## 2017-12-05 LAB — CBC
HEMATOCRIT: 41.3 % (ref 36.0–46.0)
Hemoglobin: 12.9 g/dL (ref 12.0–15.0)
MCH: 31 pg (ref 26.0–34.0)
MCHC: 31.2 g/dL (ref 30.0–36.0)
MCV: 99.3 fL (ref 78.0–100.0)
Platelets: 249 10*3/uL (ref 150–400)
RBC: 4.16 MIL/uL (ref 3.87–5.11)
RDW: 13.1 % (ref 11.5–15.5)
WBC: 8.5 10*3/uL (ref 4.0–10.5)

## 2017-12-05 LAB — HEPARIN LEVEL (UNFRACTIONATED)
HEPARIN UNFRACTIONATED: 0.12 [IU]/mL — AB (ref 0.30–0.70)
Heparin Unfractionated: 0.4 IU/mL (ref 0.30–0.70)

## 2017-12-05 MED ORDER — SODIUM CHLORIDE 0.9% FLUSH
10.0000 mL | INTRAVENOUS | Status: DC | PRN
  Administered 2017-12-05 – 2017-12-06 (×2): 20 mL
  Administered 2017-12-07 – 2017-12-08 (×2): 10 mL
  Administered 2017-12-13: 30 mL
  Filled 2017-12-05 (×4): qty 40

## 2017-12-05 MED ORDER — TRAVASOL 10 % IV SOLN
INTRAVENOUS | Status: AC
  Administered 2017-12-05: 19:00:00 via INTRAVENOUS
  Filled 2017-12-05: qty 806.4

## 2017-12-05 NOTE — Progress Notes (Signed)
[facility] Progress Note  Patient: Lisa Hopkins ZOX:[id number]   PCP: Lucky Cowboy, MD DOB: 07/02/1924   DOA: 11/29/2017   DOS: 12/05/2017   Date of Service: the patient was seen and examined on 12/05/2017  Subjective: Tolerating clear liquids but feeling tired and fatigue.  No nausea no vomiting.  Brief hospital course: Lisa Hopkins a 84 y.o.femalewith medical history significant forhypertension, history of diabetes now diet controlled, chronic kidney disease stage II, history of CVA, dementia, and atrial fibrillation on warfarin, now presenting to the emergency department for evaluation of upper abdominal pain, nausea, and nonbloody vomiting. Patient was admitted to the hospital from 11/25/2017 until 11/29/2017 for the same symptoms, had GI and surgical consultations, underwent CT of the abdomen and pelvis and EGD, and her complaints were attributed to large hiatal hernia. She improved with medical management during the hospitalization, was discharged in much improved condition, and was planning to follow-up with surgery as an outpatient for possible surgical treatment. Unfortunately, soon after leaving the hospital, her pain became severe again and she had recurrence and nausea with nonbloody vomiting. She reports the symptoms to be the same as those during the recent hospitalization, denies fevers or chills, and denies chest pain or shortness of breath. Her son is at the bedside and assists with the history.  ED Course:Upon arrival to the ED, patient is found to be afebrile, saturating well on room air, mildly hypertensive, and vitals otherwise normal. Chemistry panel is notable for a potassium of 3.3 and bilirubin 1.6. CBC is notable for a mild polycythemia. Patient was given 500 cc normal saline, IV morphine, and IV Zofran in the ED. She had transient improvement in her symptoms with these measures in the ED, but pain became unbearable and she developed recurrent  nausea with vomiting within about an hour.   Admitted with recurrent nausea, vomiting, abdominal pain 24 hours after recent admission. Symptoms are thought to be related to large hiatal hernia. She was evaluated by cardiology who thought patient was high risk, but ok to proceed with surgery if needed. She was evaluated by surgery, Dr Ezzard Standing wants to give another trial of clears, if patient fail she might need sx.    Currently further plan is continue to see if the patient can tolerate advancing of the diet and if not consider surgical option versus comfort.  Assessment and Plan: 1-Persistent Nausea vomiting, abdominal pain, Para esophageal Hiatal hernia.  Came back with nausea, vomiting.  -Endoscopy 7-3; showed proximal esophageal wed, stomach shows hiatal hernia no ulcer.  -surgery and GI consulted again. Plan for surgery next week.  -IV Zofran and phenergan PRN for nausea.  Cardiology consulted for pre op eval. Echo normal EF, mitral and tricuspid regurgitation. Follow cardio rec. Per cardiology if surgery needed, could proceed, patient is optimized cardiac stand point.  Discussed with sx, started  TPN for nutrition.  Plan to try clear diet.  Surgery following.   Coffee ground emesis;  Initially on IV Protonix gtt. Now on protonix IV BID>  PRN Zofran for nausea.  IV fluids.  GI notified of readmission and new problem.  KUB negative for obstruction.  If abdominal pain, and vomiting gets worse will need CT abdomen, per GI rec.  HB has remain stable.  No further vomiting.   History of CVA; A fib; will need to be on heparin when INR less than 2.  Now on heparin Gtt  CKD stage 2; stable   Chronic A fib;  On metoprolol  at home. IV metoprolol BID>   HTN; continue with PRN hydralazine. Marland Kitchen. Resume cozaar now she has not been vomiting.   Diet: Clear liquid diet DVT Prophylaxis: on therapeutic anticoagulation.  Advance goals of care discussion: DNRDNI  Family Communication:  family was present at bedside, at the time of interview. The pt provided permission to discuss medical plan with the family. Opportunity was given to ask question and all questions were answered satisfactorily.   Disposition:  Discharge to SNF.  Consultants: General surgery  Gastroenterology   Procedures: none  Antibiotics: Anti-infectives (From admission, onward)   None       Objective: Physical Exam: Vitals:   12/05/17 0456 12/05/17 0500 12/05/17 1210 12/05/17 1502  BP: (!) 152/73  (!) 162/87 (!) 162/77  Pulse: (!) 56  73 69  Resp: 17     Temp: 98.5 F (36.9 C)   97.7 F (36.5 C)  TempSrc: Oral   Oral  SpO2: 94%   95%  Weight:  57.4 kg (126 lb 8.7 oz)    Height:        Intake/Output Summary (Last 24 hours) at 12/05/2017 1529 Last data filed at 12/05/2017 1336 Gross per 24 hour  Intake 2260.69 ml  Output 500 ml  Net 1760.69 ml   Filed Weights   12/03/17 0500 12/04/17 0500 12/05/17 0500  Weight: 56.2 kg (123 lb 14.4 oz) 58.6 kg (129 lb 3 oz) 57.4 kg (126 lb 8.7 oz)   General: Alert, Awake and Oriented to Time, Place and Person. Appear in mild distress, affect appropriate Eyes: PERRL, Conjunctiva normal ENT: Oral Mucosa clear moist Neck: no JVD, no Abnormal Mass Or lumps Cardiovascular: S1 and S2 Present, no Murmur, Peripheral Pulses Present Respiratory: normal respiratory effort, Bilateral Air entry equal and Decreased, no use of accessory muscle, Clear to Auscultation, no Crackles, no wheezes Abdomen: Bowel Sound present, Soft and no tenderness, no hernia Skin: no redness, no Rash, no induration Extremities: no Pedal edema, no calf tenderness Neurologic: Grossly no focal neuro deficit. Bilaterally Equal motor strength  Data Reviewed: CBC: Recent Labs  Lab 12/01/17 1550 12/02/17 0612 12/03/17 0340 12/04/17 0358 12/05/17 0310  WBC 12.3* 9.7 8.9 7.0 8.5  NEUTROABS  --   --  5.0  --   --   HGB 14.0 12.6 12.4 12.3 12.9  HCT 42.3 38.4 38.4 38.5 41.3  MCV  94.4 95.8 96.7 97.2 99.3  PLT 233 201 230 229 249   Basic Metabolic Panel: Recent Labs  Lab 11/30/17 0225 12/01/17 0732 12/02/17 0612 12/02/17 1212 12/03/17 0340 12/04/17 0358  NA 137 139 137  --  136 136  K 3.2* 3.7 3.4*  --  3.7 3.9  CL 98 111 107  --  107 105  CO2 22 23 22   --  25 25  GLUCOSE 156* 166* 146*  --  108* 136*  BUN 14 14 5*  --  6* 11  CREATININE 0.89 0.79 0.65  --  0.64 0.62  CALCIUM 9.1 8.3* 8.0*  --  8.2* 8.2*  MG 1.8 1.7  --  1.7 2.2 1.9  PHOS  --   --   --  2.1* 3.1 3.4    Liver Function Tests: Recent Labs  Lab 11/29/17 2248 11/30/17 0225 12/01/17 0732 12/03/17 0340  AST 25 22 16  14*  ALT 17 16 12 11   ALKPHOS 102 98 69 69  BILITOT 1.6* 1.5* 0.8 1.0  PROT 6.9 6.8 4.8* 4.8*  ALBUMIN 3.8 3.6 2.6* 2.5*  Recent Labs  Lab 11/29/17 2248  LIPASE 50   No results for input(s): AMMONIA in the last 168 hours. Coagulation Profile: Recent Labs  Lab 12/01/17 0839 12/01/17 1550 12/01/17 1710 12/02/17 0612 12/03/17 0340  INR 5.35* 1.92 1.80 1.28 1.00   Cardiac Enzymes: No results for input(s): CKTOTAL, CKMB, CKMBINDEX, TROPONINI in the last 168 hours. BNP (last 3 results) No results for input(s): PROBNP in the last 8760 hours. CBG: Recent Labs  Lab 12/04/17 1236 12/04/17 1749 12/05/17 0001 12/05/17 0610 12/05/17 1156  GLUCAP 162* 150* 152* 145* 151*   Studies: No results found.  Scheduled Meds: . cholecalciferol  2,000 Units Oral Daily  .  HYDROmorphone (DILAUDID) injection  0.5 mg Intravenous Once  . insulin aspart  0-9 Units Subcutaneous Q6H  . losartan  50 mg Oral Daily  . mouth rinse  15 mL Mouth Rinse BID  . metoprolol tartrate  5 mg Intravenous Q4H  . pantoprazole  40 mg Intravenous Q12H  . sodium chloride flush  10-40 mL Intracatheter Q12H   Continuous Infusions: . heparin 950 Units/hr (12/05/17 0650)  . TPN ADULT (ION) 60 mL/hr at 12/05/17 1336  . TPN ADULT (ION)     PRN Meds: acetaminophen **OR** acetaminophen,  bisacodyl, dicyclomine, hydrALAZINE, morphine injection, ondansetron **OR** ondansetron (ZOFRAN) IV, promethazine, QUEtiapine, senna-docusate, sodium chloride flush  Time spent: 35 minutes  Author: Lynden Oxford, MD [facility] Pager: [phone] 12/05/2017 3:29 PM  If 7PM-7AM, please contact night-coverage at [email or web address], password [facility]

## 2017-12-05 NOTE — Progress Notes (Signed)
ANTICOAGULATION CONSULT NOTE - Follow Up Consult  Pharmacy Consult for heparin  Indication: atrial fibrillation and pulmonary embolus  Allergies  Allergen Reactions  . Ace Inhibitors Other (See Comments)    Cough  . Fosamax [Alendronate Sodium] Other (See Comments)    Heart burn  . Norvasc [Amlodipine Besylate] Other (See Comments)    edema  . Zocor [Simvastatin] Other (See Comments)    Elevated CPK    Patient Measurements: Height: 5\' 5"  (165.1 cm) Weight: 129 lb 3 oz (58.6 kg) IBW/kg (Calculated) : 57   Vital Signs: Temp: 98.5 F (36.9 C) (07/10 0456) Temp Source: Oral (07/10 0456) BP: 152/73 (07/10 0456) Pulse Rate: 56 (07/10 0456)  Labs: Recent Labs    12/02/17 0612  12/03/17 0340 12/04/17 0358 12/05/17 0310  HGB 12.6  --  12.4 12.3 12.9  HCT 38.4  --  38.4 38.5 41.3  PLT 201  --  230 229 249  LABPROT 15.9*  --  13.1  --   --   INR 1.28  --  1.00  --   --   HEPARINUNFRC 0.11*   < > 0.30 0.36 0.12*  CREATININE 0.65  --  0.64 0.62  --    < > = values in this interval not displayed.    Estimated Creatinine Clearance: 42.1 mL/min (by C-G formula based on SCr of 0.62 mg/dL).  Assessment: 84 yo female with history of afib on warfarin PTA- currently on hold as patient with N/V and recent coffee ground emesis with no bleeding noted. GI ok with heparin.  Heparin level this morning is SUBtherapeutic (HL 0.12, goal of 0.3-0.5). RN reports drip infusing appropriately and no pauses. Will increase and recheck another level this afternoon.   Goal of Therapy:  Heparin level 0.3-0.5 units/ml Monitor platelets by anticoagulation protocol: Yes   Plan:  - Increase Heparin to 950 units/hr (9.5 ml/hr) - Will continue to monitor for any signs/symptoms of bleeding and will follow up with heparin level in 8 hours   Thank you for allowing pharmacy to be a part of this patient's care.  Georgina PillionElizabeth Keeara Frees, PharmD, BCPS Clinical Pharmacist Pager: [phone] If after 3:30p, please  call main pharmacy at: [phone] 12/05/2017 5:06 AM

## 2017-12-05 NOTE — Progress Notes (Addendum)
[facility] Progress Note     Subjective: CC-  No family at bedside. Patient resting well. Denies any current abdominal pain or nausea.  Spoke with patient's nurse who states that Ms. Lawrance did not take in many clears yesterday. She never vomited but had multiple episodes of dry heaving. She had 2 BMs.  Objective: Vital signs in last 24 hours: Temp:  [98.1 F (36.7 C)-98.5 F (36.9 C)] 98.5 F (36.9 C) (07/10 0456) Pulse Rate:  [56-82] 56 (07/10 0456) Resp:  [17] 17 (07/10 0456) BP: (152-188)/(73-115) 152/73 (07/10 0456) SpO2:  [94 %-96 %] 94 % (07/10 0456) Weight:  [126 lb 8.7 oz (57.4 kg)] 126 lb 8.7 oz (57.4 kg) (07/10 0500) Last BM Date: 12/05/17  Intake/Output from previous day: 07/09 0701 - 07/10 0700 In: 1893.6 [P.O.:240; I.V.:1653.6] Out: 500 [Urine:500] Intake/Output this shift: No intake/output data recorded.  PE: Gen: Alert, NAD HEENT: EOM's intact, pupils equal and round Pulm: CTAB, no W/R/R, effort normal Abd: Soft,mild distension,nontender, +BS ZOX:WRUEAVExt:Calves soft and nontender Skin: no rashes noted, warm and dry  Lab Results:  Recent Labs    12/04/17 0358 12/05/17 0310  WBC 7.0 8.5  HGB 12.3 12.9  HCT 38.5 41.3  PLT 229 249   BMET Recent Labs    12/03/17 0340 12/04/17 0358  NA 136 136  K 3.7 3.9  CL 107 105  CO2 25 25  GLUCOSE 108* 136*  BUN 6* 11  CREATININE 0.64 0.62  CALCIUM 8.2* 8.2*   PT/INR Recent Labs    12/03/17 0340  LABPROT 13.1  INR 1.00   CMP     Component Value Date/Time   NA 136 12/04/2017 0358   K 3.9 12/04/2017 0358   CL 105 12/04/2017 0358   CO2 25 12/04/2017 0358   GLUCOSE 136 (H) 12/04/2017 0358   BUN 11 12/04/2017 0358   CREATININE 0.62 12/04/2017 0358   CREATININE 1.03 (H) 11/08/2017 1100   CALCIUM 8.2 (L) 12/04/2017 0358   PROT 4.8 (L) 12/03/2017 0340   ALBUMIN 2.5 (L) 12/03/2017 0340   AST 14 (L) 12/03/2017 0340   ALT 11 12/03/2017 0340   ALKPHOS 69 12/03/2017 0340   BILITOT  1.0 12/03/2017 0340   GFRNONAA >60 12/04/2017 0358   GFRNONAA 48 (L) 11/08/2017 1100   GFRAA >60 12/04/2017 0358   GFRAA 55 (L) 11/08/2017 1100   Lipase     Component Value Date/Time   LIPASE 50 11/29/2017 2248       Studies/Results: No results found.  Anti-infectives: Anti-infectives (From admission, onward)   None       Assessment/Plan DM HTN CKD-II Afib - on coumadin H/o CVA Code status DNR  Intractable n/v, abd pain Large hiatal hernia - EGD7/3showed web without significant obstruction / PEH without obstruction or ischemia -readmitted 7/4 with persistent n/v - not good candidate for hiatal hernia repair - per cardiology patient is high risk forperioperative cardiac complications and is at a low functional capacity  ID -none FEN - TNA,CLD VTE -SCDs,heparin drip,holdcoumadin Foley -none  Plan -Patient did not vomit yesterday but she was dry heaving and did not take in many liquids.    Continue clears today and encourage more OOB/mobilization. May need to discuss surgery again.   LOS: 5 days   Franne FortsBrooke A Meuth , [facility] 12/05/2017, 8:09 AM Pager: [phone] Consults: [phone]  Agree with above.  Taking some clear liquids.  Appears to be doing better. In the room by herself. Progressing slowly.  Alphonsa Overall, MD, [facility] Pager: [phone] Office phone:  [phone]

## 2017-12-05 NOTE — Progress Notes (Signed)
PHARMACY - ADULT TOTAL PARENTERAL NUTRITION CONSULT NOTE   Pharmacy Consult:  TPN  Indication: Large hiatal hernia  Patient Measurements: Height: 5\' 5"  (165.1 cm) Weight: 126 lb 8.7 oz (57.4 kg) IBW/kg (Calculated) : 57 TPN AdjBW (KG): 55.4 Body mass index is 21.06 kg/m. Usual Weight: 54 kg  Assessment:  89 YOF with PMH significant for HTN, DM-diet controlled, CKD2, CVA, dementia, Afib on Coumadin PTA who had a recent admission 6/30-7/4 for large hiatal hernia. She represents this admission with worsening abdominal pain and persistent nausea and non-bloody vomiting. Patient did have some coffee-ground emesis but this has resolved.   Per son, patient was eating normally without weight loss. Since 6/30, patient was on liquid diet then soft diet but only taking in ~10-20% of her meal trays while admitted. After discharge on 7/4, she was able to eat a tiny amount of butternut squash soup and a few spoonfuls of applesauce x1 occurrence. No further intake since that time.   GI: possible surgery this week. LBM 7/10. PPI IV BID, PRN Phenergan/Zofran Endo: diet-controlled DM - CBGs controlled Insulin requirements in the past 24 hours: 7 units SSI Lytes: all WNL on 7/9 Renal: SCr 0.64 stable, BUN WNL - UOP 0.4 ml/kg/hr, net +8.9L  Pulm: stable on RA Cards: BP elevated, HR trending down, EF 65-70% - losartan, IV metoprolol, PRN hydralazine Hepatobil: LFTs / tbili / TG WNL Neuro: CVA, dementia - A&O ID: afebrile, WBC WNL - not on abx Heme: Coumadin PTA for Afib >> Vit K 2.5mg  IV on 7/6 >> Heparin. Coffee-ground emesis is resolved. CBC WNL TPN Access: PICC placed 12/02/17 TPN start date: 12/02/17   Nutritional Goals (RD rec pending): 1400-1550 kCal and 75-85gm protein per day  Current Nutrition:  TPN Clear liquid diet started 7/8 PM - minimal intake   Plan:  Continue TPN at 60 ml/hr, providing 80g AA, 245g CHO and 35g ILE for a total of 1500 kCal, meeting 100% of patient needs Electrolytes  in TPN: increase Mag slightly 7/9, Cl:Ac 1:1 Daily multivitamin and trace elements in TPN Continue sensitive SSI Q6H F/U surgical plans, PO intake/diet advancement to start weaning TPN Standard TPN labs on Thurs    Jaycion Treml D. Laney Potashang, PharmD, BCPS, BCCCP 12/05/2017, 8:39 AM

## 2017-12-05 NOTE — Progress Notes (Signed)
ANTICOAGULATION CONSULT NOTE - Follow Up Consult  Pharmacy Consult:  Heparin Indication: atrial fibrillation and pulmonary embolus  Allergies  Allergen Reactions  . Ace Inhibitors Other (See Comments)    Cough  . Fosamax [Alendronate Sodium] Other (See Comments)    Heart burn  . Norvasc [Amlodipine Besylate] Other (See Comments)    edema  . Zocor [Simvastatin] Other (See Comments)    Elevated CPK    Patient Measurements: Height: 5\' 5"  (165.1 cm) Weight: 126 lb 8.7 oz (57.4 kg) IBW/kg (Calculated) : 57  Vital Signs: Temp: 98.5 F (36.9 C) (07/10 0456) Temp Source: Oral (07/10 0456) BP: 162/87 (07/10 1210) Pulse Rate: 73 (07/10 1210)  Labs: Recent Labs    12/03/17 0340 12/04/17 0358 12/05/17 0310 12/05/17 1255  HGB 12.4 12.3 12.9  --   HCT 38.4 38.5 41.3  --   PLT 230 229 249  --   LABPROT 13.1  --   --   --   INR 1.00  --   --   --   HEPARINUNFRC 0.30 0.36 0.12* 0.40  CREATININE 0.64 0.62  --   --     Estimated Creatinine Clearance: 42.1 mL/min (by C-G formula based on SCr of 0.62 mg/dL).  Assessment: 89 YOF with history of afib on warfarin PTA - currently on hold as patient with N/V and recent coffee ground emesis with no bleeding noted. GI ok with heparin.  Heparin level is therapeutic; no bleeding reported.   Goal of Therapy:  Heparin level 0.3-0.5 units/ml Monitor platelets by anticoagulation protocol: Yes    Plan:  Continue heparin gtt at 950 units/hr Daily heparin level and CBC   Tadd Holtmeyer D. Laney Potashang, PharmD, BCPS, BCCCP 12/05/2017, 2:00 PM

## 2017-12-05 NOTE — Progress Notes (Signed)
Physical Therapy Treatment Patient Details Name: Lisa Hopkins MRN: [id number] DOB: 07/30/1924 Today's Date: 12/05/2017    History of Present Illness Pt is a 84 y/o female with a PMH significant for HTN, history of diabetes now diet controlled, CKDII, CVA, dementia, and a-fib on warfarin, now presenting to the emergency department for evaluation of upper abdominal pain, nausea, and nonbloody vomiting.  Patient was admitted to the hospital from 11/25/2017 until 11/29/2017 for the same symptoms, underwent CT of the abdomen/pelvis and EGD, and she was found to have a large hiatal hernia. She was discharged in much improved condition, and was planning to follow-up with surgery as an outpatient for possible surgical treatment.  Unfortunately, soon after leaving the hospital, her pain became severe again and she had recurrence and nausea with nonbloody vomiting.     PT Comments    Pt progressing with gait tolerance. Required min to min guard A for steadying throughout gait with RW. Pt's son present in room during session and reviewed d/c recommendations. Unsure if bed available at ALF. If pt does not have necessary assist at ILF, may need SNF prior to d/c back to ILF to increase independence and safety with mobility. Will continue to follow acutely and update recommendations as necessary.   Follow Up Recommendations  Home health PT;Supervision for mobility/OOB(ILF vs ALF at [place] )     Equipment Recommendations  Rolling walker with 5" wheels    Recommendations for Other Services OT consult     Precautions / Restrictions Precautions Precautions: Fall Restrictions Weight Bearing Restrictions: No    Mobility  Bed Mobility Overal bed mobility: Needs Assistance Bed Mobility: Supine to Sit;Sit to Supine     Supine to sit: Min assist Sit to supine: Min guard   General bed mobility comments: Min A for trunk elevation to come to sitting. Min guard for safety for return to supine.    Transfers Overall transfer level: Needs assistance Equipment used: Rolling walker (2 wheeled) Transfers: Sit to/from Stand Sit to Stand: Min assist         General transfer comment: Min A for lift assist and steadying. Pt started with good hand placement, however, in middle of transfer pt pulling up on RW during transfer.   Ambulation/Gait Ambulation/Gait assistance: Min guard;Min assist Gait Distance (Feet): 125 Feet Assistive device: Rolling walker (2 wheeled) Gait Pattern/deviations: Step-through pattern;Decreased stride length;Trunk flexed;Narrow base of support Gait velocity: Decreased   General Gait Details: Slow, slightly unsteady gait, especially during turns. Min guard to min A for steadying with use of RW. Pt tended to drift to the R during ambulation and required directional cues. Cues for upright posture and proximity to device throughout gait.    Stairs             Wheelchair Mobility    Modified Rankin (Stroke Patients Only)       Balance Overall balance assessment: Needs assistance Sitting-balance support: Feet supported;No upper extremity supported Sitting balance-Leahy Scale: Fair     Standing balance support: Bilateral upper extremity supported;During functional activity Standing balance-Leahy Scale: Poor Standing balance comment: Reliant on BUE support.                             Cognition Arousal/Alertness: Awake/alert Behavior During Therapy: WFL for tasks assessed/performed Overall Cognitive Status: History of cognitive impairments - at baseline  General Comments: Baseline dementia per chart review      Exercises General Exercises - Lower Extremity Ankle Circles/Pumps: AROM;Both;10 reps    General Comments General comments (skin integrity, edema, etc.): Pt's son present during session.       Pertinent Vitals/Pain Pain Assessment: No/denies pain    Home Living                       Prior Function            PT Goals (current goals can now be found in the care plan section) Acute Rehab PT Goals Patient Stated Goal: Pt did not express goals during session PT Goal Formulation: With patient/family Time For Goal Achievement: 12/17/17 Potential to Achieve Goals: Good Progress towards PT goals: Progressing toward goals    Frequency    Min 3X/week      PT Plan Current plan remains appropriate    Co-evaluation              AM-PAC PT "6 Clicks" Daily Activity  Outcome Measure  Difficulty turning over in bed (including adjusting bedclothes, sheets and blankets)?: A Little Difficulty moving from lying on back to sitting on the side of the bed? : Unable Difficulty sitting down on and standing up from a chair with arms (e.g., wheelchair, bedside commode, etc,.)?: Unable Help needed moving to and from a bed to chair (including a wheelchair)?: A Little Help needed walking in hospital room?: A Little Help needed climbing 3-5 steps with a railing? : A Lot 6 Click Score: 13    End of Session Equipment Utilized During Treatment: Gait belt Activity Tolerance: Patient tolerated treatment well Patient left: in bed;with call bell/phone within reach;with bed alarm set Nurse Communication: Mobility status PT Visit Diagnosis: Unsteadiness on feet (R26.81);Other abnormalities of gait and mobility (R26.89)     Time: 7253-6644 PT Time Calculation (min) (ACUTE ONLY): 17 min  Charges:  $Gait Training: 8-22 mins                    G Codes:       Gladys Damme, PT, DPT  Acute Rehabilitation Services  Pager: [phone]    Lehman Prom 12/05/2017, 3:34 PM

## 2017-12-05 NOTE — Progress Notes (Signed)
Nutrition Follow-up  DOCUMENTATION CODES:   Not applicable  INTERVENTION:   -TPN management per pharmacy -RD will follow for diet advancement and supplement as appropriate  NUTRITION DIAGNOSIS:   Inadequate oral intake related to altered GI function as evidenced by NPO status.  Ongoing  GOAL:   Patient will meet greater than or equal to 90% of their needs  Met with TPN  MONITOR:   Diet advancement, Labs, Weight trends, TF tolerance, I & O's  REASON FOR ASSESSMENT:   Consult New TPN/TNA  ASSESSMENT:   Lisa Hopkins is a 84 y.o. female with medical history significant for hypertension, history of diabetes now diet controlled, chronic kidney disease stage II, history of CVA, dementia, and atrial fibrillation on warfarin, now presenting to the emergency department for evaluation of upper abdominal pain, nausea, and nonbloody vomiting.  7/7- s/p PICC line placement, TPN initiated  Pt receiving nursing care at time of visit.   Pt was advanced to clear liquid diet on 12/04/17. Per RN, pt took in minimal intake yesterday. Pt has not experienced any vomiting, but has had episodes of dry heaving. General surgery is trialing clear liquid diet, however, may need surgery.   Pt remains on TPN- infusing at 60 ml/hr, providing 1500 kcals and 80 grams of protein, providing 100% of estimated patient needs.   Labs reviewed: CBGS: 145-162 (inpatient orders for glycemic control are 0-9 units insulin aspart every 4 hours).   Diet Order:   Diet Order           Diet clear liquid Room service appropriate? Yes; Fluid consistency: Thin  Diet effective now          EDUCATION NEEDS:   Education needs have been addressed  Skin:  Skin Assessment: Reviewed RN Assessment  Last BM:  12/05/17  Height:   Ht Readings from Last 1 Encounters:  11/30/17 _0  (1.651 m)    Weight:   Wt Readings from Last 1 Encounters:  12/05/17 126 lb 8.7 oz (57.4 kg)    Ideal Body Weight:  56.8  kg  BMI:  Body mass index is 21.06 kg/m.  Estimated Nutritional Needs:   Kcal:  1450-1650  Protein:  70-85 grams  Fluid:  > 1.4 L    Laqueena Hinchey A. Jimmye Norman, RD, LDN, CDE Pager: [phone] After hours Pager: [phone]

## 2017-12-06 LAB — CBC
HCT: 38.7 % (ref 36.0–46.0)
HEMOGLOBIN: 12.2 g/dL (ref 12.0–15.0)
MCH: 30.9 pg (ref 26.0–34.0)
MCHC: 31.5 g/dL (ref 30.0–36.0)
MCV: 98 fL (ref 78.0–100.0)
PLATELETS: 238 10*3/uL (ref 150–400)
RBC: 3.95 MIL/uL (ref 3.87–5.11)
RDW: 13 % (ref 11.5–15.5)
WBC: 8.7 10*3/uL (ref 4.0–10.5)

## 2017-12-06 LAB — COMPREHENSIVE METABOLIC PANEL
ALBUMIN: 2.5 g/dL — AB (ref 3.5–5.0)
ALT: 16 U/L (ref 0–44)
ANION GAP: 6 (ref 5–15)
AST: 21 U/L (ref 15–41)
Alkaline Phosphatase: 66 U/L (ref 38–126)
BUN: 17 mg/dL (ref 8–23)
CALCIUM: 8.5 mg/dL — AB (ref 8.9–10.3)
CO2: 27 mmol/L (ref 22–32)
Chloride: 105 mmol/L (ref 98–111)
Creatinine, Ser: 0.72 mg/dL (ref 0.44–1.00)
GFR calc non Af Amer: >60 mL/min (ref >60)
Glucose, Bld: 142 mg/dL — ABNORMAL HIGH (ref 70–99)
POTASSIUM: 3.9 mmol/L (ref 3.5–5.1)
Sodium: 138 mmol/L (ref 135–145)
Total Bilirubin: 0.6 mg/dL (ref 0.3–1.2)
Total Protein: 5 g/dL — ABNORMAL LOW (ref 6.5–8.1)

## 2017-12-06 LAB — HEPARIN LEVEL (UNFRACTIONATED)
Heparin Unfractionated: 0.23 IU/mL — ABNORMAL LOW (ref 0.30–0.70)
Heparin Unfractionated: 0.47 IU/mL (ref 0.30–0.70)

## 2017-12-06 LAB — GLUCOSE, CAPILLARY
GLUCOSE-CAPILLARY: 155 mg/dL — AB (ref 70–99)
GLUCOSE-CAPILLARY: 144 mg/dL — AB (ref 70–99)
Glucose-Capillary: 152 mg/dL — ABNORMAL HIGH (ref 70–99)
Glucose-Capillary: 168 mg/dL — ABNORMAL HIGH (ref 70–99)

## 2017-12-06 LAB — PHOSPHORUS: PHOSPHORUS: 3.1 mg/dL (ref 2.5–4.6)

## 2017-12-06 LAB — MAGNESIUM: Magnesium: 2 mg/dL (ref 1.7–2.4)

## 2017-12-06 MED ORDER — BOOST / RESOURCE BREEZE PO LIQD CUSTOM
1.0000 | Freq: Two times a day (BID) | ORAL | Status: DC
  Administered 2017-12-06 – 2017-12-07 (×3): 1 via ORAL

## 2017-12-06 MED ORDER — TRAVASOL 10 % IV SOLN
INTRAVENOUS | Status: AC
  Administered 2017-12-06: 18:00:00 via INTRAVENOUS
  Filled 2017-12-06: qty 806.4

## 2017-12-06 NOTE — Progress Notes (Addendum)
  [facility] Progress Note     Subjective: CC-  No complaints this morning. Patient denies n/v or abdominal pain. Tolerating more clears yesterday without n/v. She had 2 BMs. Worked well with PT.  Objective: Vital signs in last 24 hours: Temp:  [97.6 F (36.4 C)-97.8 F (36.6 C)] 97.6 F (36.4 C) (07/11 0623) Pulse Rate:  [59-73] 59 (07/11 0623) Resp:  [16-17] 16 (07/11 0623) BP: (147-162)/(64-87) 147/64 (07/11 0623) SpO2:  [94 %-95 %] 94 % (07/11 0623) Last BM Date: 12/05/17  Intake/Output from previous day: 07/10 0701 - 07/11 0700 In: 1636.3 [P.O.:480; I.V.:1156.3] Out: 800 [Urine:800] Intake/Output this shift: No intake/output data recorded.  PE: Gen: Alert, NAD HEENT: EOM's intact, pupils equal and round Pulm: CTAB, no W/R/R, effort normal Abd: Soft,mild distension,nontender, +BS XLK:GMWNUUExt:Calves soft and nontender Skin: no rashes noted, warm and dry  Lab Results:  Recent Labs    12/05/17 0310 12/06/17 0327  WBC 8.5 8.7  HGB 12.9 12.2  HCT 41.3 38.7  PLT 249 238   BMET Recent Labs    12/04/17 0358 12/06/17 0327  NA 136 138  K 3.9 3.9  CL 105 105  CO2 25 27  GLUCOSE 136* 142*  BUN 11 17  CREATININE 0.62 0.72  CALCIUM 8.2* 8.5*   PT/INR No results for input(s): LABPROT, INR in the last 72 hours. CMP     Component Value Date/Time   NA 138 12/06/2017 0327   K 3.9 12/06/2017 0327   CL 105 12/06/2017 0327   CO2 27 12/06/2017 0327   GLUCOSE 142 (H) 12/06/2017 0327   BUN 17 12/06/2017 0327   CREATININE 0.72 12/06/2017 0327   CREATININE 1.03 (H) 11/08/2017 1100   CALCIUM 8.5 (L) 12/06/2017 0327   PROT 5.0 (L) 12/06/2017 0327   ALBUMIN 2.5 (L) 12/06/2017 0327   AST 21 12/06/2017 0327   ALT 16 12/06/2017 0327   ALKPHOS 66 12/06/2017 0327   BILITOT 0.6 12/06/2017 0327   GFRNONAA >60 12/06/2017 0327   GFRNONAA 48 (L) 11/08/2017 1100   GFRAA >60 12/06/2017 0327   GFRAA 55 (L) 11/08/2017 1100   Lipase     Component Value  Date/Time   LIPASE 50 11/29/2017 2248       Studies/Results: No results found.  Anti-infectives: Anti-infectives (From admission, onward)   None       Assessment/Plan DM HTN CKD-II Afib - on coumadin H/o CVA Code status DNR  Intractable n/v, abd pain Large hiatal hernia - EGD7/3showed web without significant obstruction / PEH without obstruction or ischemia -readmitted 7/4 with persistent n/v - not good candidate for hiatal hernia repair - per cardiology patient is high risk forperioperative cardiac complications and is at a low functional capacity  ID -none FEN - TNA,FLD, Boost VTE -SCDs,heparin drip,holdcoumadin Foley -none  Plan -Advance to full liquids and add Boost.   Continue full TNA for now until patient reliably tolerating enough calories.  Continue therapies and encourage out of bed.   LOS: 6 days    Franne FortsBrooke A Meuth , [facility] 12/06/2017, 8:22 AM Pager: [phone] Consults: [phone]  Agree with above. Seems to be getting a little better. Son, Freida Busmanllen, at bedside. She walked the halls today some.  Ovidio Kinavid Yuritzi Kamp, MD, [facility] Pager: [phone] Office phone:  [phone]

## 2017-12-06 NOTE — Progress Notes (Signed)
PHARMACY - ADULT TOTAL PARENTERAL NUTRITION CONSULT NOTE   Pharmacy Consult:  TPN  Indication: Large hiatal hernia  Patient Measurements: Height: 5\' 5"  (165.1 cm) Weight: 126 lb 8.7 oz (57.4 kg) IBW/kg (Calculated) : 57 TPN AdjBW (KG): 55.4 Body mass index is 21.06 kg/m. Usual Weight: 54 kg  Assessment:  89 YOF with PMH significant for HTN, DM-diet controlled, CKD2, CVA, dementia, Afib on Coumadin PTA who had a recent admission 6/30-7/4 for large hiatal hernia. She represents this admission with worsening abdominal pain and persistent nausea and non-bloody vomiting. Patient did have some coffee-ground emesis but this has resolved.   Per son, patient was eating normally without weight loss. Since 6/30, patient was on liquid diet then soft diet but only taking in ~10-20% of her meal trays while admitted. After discharge on 7/4, she was able to eat a tiny amount of butternut squash soup and a few spoonfuls of applesauce x1 occurrence. No further intake since that time.   GI: prealbumin 10.3.  BM x2.  PPI IV BID, PRN Phenergan/Zofran.  Advancing diet, likely not getting surgery Endo: diet-controlled DM - CBGs controlled Insulin requirements in the past 24 hours: 6 units SSI Lytes: all WNL  Renal: SCr 0.72 stable, BUN WNL - UOP 0.6 ml/kg/hr, net +8.9L  Pulm: stable on RA Cards: BP improving, HR stable, EF 65-70% - losartan, IV metoprolol, PRN hydralazine Hepatobil: LFTs / tbili / TG WNL Neuro: CVA, dementia - A&O ID: afebrile, WBC WNL - not on abx Heme: Coumadin PTA for Afib >> Vit K 2.5mg  IV on 7/6 >> Heparin. Coffee-ground emesis is resolved. CBC WNL TPN Access: PICC placed 12/02/17 TPN start date: 12/02/17   Nutritional Goals (RD rec on 7/10): 1400-1550 kCal and 75-85gm protein per day  Current Nutrition:  TPN Full liquid diet Resource Breeze BID   Plan:  Continue TPN at 60 ml/hr, providing 80g AA, 245g CHO and 35g ILE for a total of 1500 kCal, meeting 100% of patient  needs Electrolytes in TPN: no changes today, Cl:Ac 1:1 Daily multivitamin and trace elements in TPN Continue sensitive SSI Q6H F/U PO intake/diet advancement to start weaning TPN   Lisa Hopkins D. Laney Lisa Hopkins, PharmD, BCPS, BCCCP 12/06/2017, 8:52 AM

## 2017-12-06 NOTE — Evaluation (Signed)
Occupational Therapy Evaluation Patient Details Name: Lisa Hopkins MRN: [id number] DOB: Aug 16, 1924 Today's Date: 12/06/2017    History of Present Illness Pt is a 84 y/o female with a PMH significant for HTN, history of diabetes now diet controlled, CKDII, CVA, dementia, and a-fib on warfarin, now presenting to the emergency department for evaluation of upper abdominal pain, nausea, and nonbloody vomiting.  Patient was admitted to the hospital from 11/25/2017 until 11/29/2017 for the same symptoms, underwent CT of the abdomen/pelvis and EGD, and she was found to have a large hiatal hernia. She was discharged in much improved condition, and was planning to follow-up with surgery as an outpatient for possible surgical treatment.  Unfortunately, soon after leaving the hospital, her pain became severe again and she had recurrence and nausea with nonbloody vomiting.    Clinical Impression   Pt typically functions modified independently in ADL and walks with a rollator. She relies on [facility] staff for IADL. Pt presents with baseline memory deficits and impaired standing balance requiring set up to min guard assist for ADL and mobility. No complaints of pain or nausea this visit. Will follow acutely.    Follow Up Recommendations  Home health OT;Supervision/Assistance - 24 hour(at ILF)    Equipment Recommendations       Recommendations for Other Services       Precautions / Restrictions Precautions Precautions: Fall Restrictions Weight Bearing Restrictions: No      Mobility Bed Mobility Overal bed mobility: Needs Assistance Bed Mobility: Supine to Sit   Sidelying to sit: Supervision       General bed mobility comments: HOB up and use of rail  Transfers Overall transfer level: Needs assistance Equipment used: Rolling walker (2 wheeled) Transfers: Sit to/from Stand Sit to Stand: Supervision         General transfer comment: supervision for safety    Balance Overall  balance assessment: Needs assistance   Sitting balance-Leahy Scale: Good Sitting balance - Comments: no LOB donning sock     Standing balance-Leahy Scale: Poor Standing balance comment: Reliant on BUE support during ambulation                           ADL either performed or assessed with clinical judgement   ADL Overall ADL's : Needs assistance/impaired Eating/Feeding: Independent;Sitting   Grooming: Supervision/safety;Standing   Upper Body Bathing: Set up;Sitting   Lower Body Bathing: Supervison/ safety;Sit to/from stand   Upper Body Dressing : Set up;Sitting   Lower Body Dressing: Supervision/safety;Sit to/from stand   Toilet Transfer: Min guard;Ambulation;RW   Toileting- Clothing Manipulation and Hygiene: Supervision/safety;Sit to/from stand       Functional mobility during ADLs: Min guard;Rolling walker       Vision Patient Visual Report: No change from baseline       Perception     Praxis      Pertinent Vitals/Pain Pain Assessment: Faces Faces Pain Scale: No hurt     Hand Dominance Right   Extremity/Trunk Assessment Upper Extremity Assessment Upper Extremity Assessment: Overall WFL for tasks assessed   Lower Extremity Assessment Lower Extremity Assessment: Defer to PT evaluation   Cervical / Trunk Assessment Cervical / Trunk Assessment: Kyphotic   Communication Communication Communication: HOH   Cognition Arousal/Alertness: Awake/alert Behavior During Therapy: WFL for tasks assessed/performed Overall Cognitive Status: History of cognitive impairments - at baseline  General Comments: Baseline dementia per chart review   General Comments       Exercises     Shoulder Instructions      Home Living Family/patient expects to be discharged to:: Private residence Living Arrangements: Alone Available Help at Discharge: Family;Available PRN/intermittently;Personal care  attendant;Available 24 hours/day Type of Home: Independent living facility Home Access: Level entry     Home Layout: One level     Bathroom Shower/Tub: Producer, television/film/video: Standard     Home Equipment: Environmental consultant - 4 wheels;Cane - single point;Grab bars - toilet;Grab bars - tub/shower   Additional Comments: ALF is full, family is arranging 24 hour assist through [facility]      Prior Functioning/Environment Level of Independence: Independent with assistive device(s)        Comments: Rollator occasionally and SPC occasionally. Mostly furniture walks per son        OT Problem List: Impaired balance (sitting and/or standing)      OT Treatment/Interventions: Self-care/ADL training;DME and/or AE instruction;Patient/family education;Balance training    OT Goals(Current goals can be found in the care plan section) Acute Rehab OT Goals Patient Stated Goal: to go home OT Goal Formulation: With patient Time For Goal Achievement: 12/20/17 Potential to Achieve Goals: Good ADL Goals Pt Will Perform Grooming: with modified independence;standing Pt Will Perform Lower Body Bathing: with modified independence;sit to/from stand Pt Will Perform Lower Body Dressing: with modified independence;sit to/from stand Pt Will Transfer to Toilet: with modified independence;ambulating Pt Will Perform Toileting - Clothing Manipulation and hygiene: with modified independence;sit to/from stand  OT Frequency: Min 2X/week   Barriers to D/C:            Co-evaluation              AM-PAC PT "6 Clicks" Daily Activity     Outcome Measure Help from another person eating meals?: None Help from another person taking care of personal grooming?: A Little Help from another person toileting, which includes using toliet, bedpan, or urinal?: A Little Help from another person bathing (including washing, rinsing, drying)?: A Little Help from another person to put on and taking off regular  upper body clothing?: None Help from another person to put on and taking off regular lower body clothing?: A Little 6 Click Score: 20   End of Session Equipment Utilized During Treatment: Gait belt;Rolling walker  Activity Tolerance: Patient tolerated treatment well Patient left: in chair;with call bell/phone within reach;with family/visitor present  OT Visit Diagnosis: Unsteadiness on feet (R26.81)                Time: 0865-7846 OT Time Calculation (min): 16 min Charges:  OT General Charges $OT Visit: 1 Visit OT Evaluation $OT Eval Moderate Complexity: 1 Mod G-Codes:     12/21/17 Martie Round, OTR/L Pager: [phone]  [place] Planas [place] Bailiff 12-21-2017, 1:53 PM

## 2017-12-06 NOTE — Progress Notes (Signed)
[facility] Progress Note  Patient: Lisa Hopkins NFA:[id number]   PCP: Lucky Cowboy, MD DOB: [birth year]   DOA: 11/29/2017   DOS: 12/06/2017   Date of Service: the patient was seen and examined on 12/06/2017  Subjective: Feeling okay, tolerating clear liquid.  Surgery advancing to full liquid.  Passing gas.  Brief hospital course: Lisa Hopkins a 84 y.o.femalewith medical history significant forhypertension, history of diabetes now diet controlled, chronic kidney disease stage II, history of CVA, dementia, and atrial fibrillation on warfarin, now presenting to the emergency department for evaluation of upper abdominal pain, nausea, and nonbloody vomiting. Patient was admitted to the hospital from 11/25/2017 until 11/29/2017 for the same symptoms, had GI and surgical consultations, underwent CT of the abdomen and pelvis and EGD, and her complaints were attributed to large hiatal hernia. She improved with medical management during the hospitalization, was discharged in much improved condition, and was planning to follow-up with surgery as an outpatient for possible surgical treatment. Unfortunately, soon after leaving the hospital, her pain became severe again and she had recurrence and nausea with nonbloody vomiting. She reports the symptoms to be the same as those during the recent hospitalization, denies fevers or chills, and denies chest pain or shortness of breath. Her son is at the bedside and assists with the history.  ED Course:Upon arrival to the ED, patient is found to be afebrile, saturating well on room air, mildly hypertensive, and vitals otherwise normal. Chemistry panel is notable for a potassium of 3.3 and bilirubin 1.6. CBC is notable for a mild polycythemia. Patient was given 500 cc normal saline, IV morphine, and IV Zofran in the ED. She had transient improvement in her symptoms with these measures in the ED, but pain became unbearable and she developed  recurrent nausea with vomiting within about an hour.   Admitted with recurrent nausea, vomiting, abdominal pain 24 hours after recent admission. Symptoms are thought to be related to large hiatal hernia. She was evaluated by cardiology who thought patient was high risk, but ok to proceed with surgery if needed. She was evaluated by surgery, Dr Ezzard Standing wants to give another trial of clears, if patient fail she might need sx.    Currently further plan is continue to see if the patient can tolerate advancing of the diet and if not consider surgical option versus comfort.  Assessment and Plan: 1-Persistent Nausea vomiting, abdominal pain, Para esophageal Hiatal hernia.  Came back with nausea, vomiting.  -Endoscopy 7-3; showed proximal esophageal wed, stomach shows hiatal hernia no ulcer.  -surgery and GI consulted again. Plan for surgery next week.  -IV Zofran and phenergan PRN for nausea.  Cardiology consulted for pre op eval. Echo normal EF, mitral and tricuspid regurgitation. Follow cardio rec. Per cardiology if surgery needed, could proceed, patient is optimized cardiac stand point.  Discussed with sx, started  TPN for nutrition.  Now transitioning to full liquid diet today and monitor progress.  She will likely stay on full liquid diet going forward.  Coffee ground emesis;  Initially on IV Protonix gtt. Now on protonix IV BID>  PRN Zofran for nausea.  IV fluids.  GI notified of readmission and new problem.  KUB negative for obstruction.  If abdominal pain, and vomiting gets worse will need CT abdomen, per GI rec.  HB has remain stable.  No further vomiting.   History of CVA; A fib; will need to be on heparin when INR less than 2.  Now on heparin Gtt  CKD stage 2; stable   Chronic A fib;  On metoprolol at home. IV metoprolol BID>   HTN; continue with PRN hydralazine. Marland Kitchen. Resume cozaar now she has not been vomiting.   Diet: full liquid diet DVT Prophylaxis: on therapeutic  anticoagulation.  Advance goals of care discussion: DNRDNI  Family Communication: family was present at bedside, at the time of interview. The pt provided permission to discuss medical plan with the family. Opportunity was given to ask question and all questions were answered satisfactorily.   Disposition:  Discharge to SNF.  Consultants: General surgery  Gastroenterology   Procedures: none  Antibiotics: Anti-infectives (From admission, onward)   None       Objective: Physical Exam: Vitals:   12/05/17 1502 12/05/17 2021 12/06/17 0623 12/06/17 1330  BP: (!) 162/77 (!) 150/79 (!) 147/64 (!) 150/90  Pulse: 69 69 (!) 59 (!) 57  Resp:  17 16   Temp: 97.7 F (36.5 C) 97.8 F (36.6 C) 97.6 F (36.4 C) 97.7 F (36.5 C)  TempSrc: Oral Oral Oral Oral  SpO2: 95% 95% 94% 96%  Weight:      Height:        Intake/Output Summary (Last 24 hours) at 12/06/2017 1337 Last data filed at 12/06/2017 0930 Gross per 24 hour  Intake 853.09 ml  Output 800 ml  Net 53.09 ml   Filed Weights   12/03/17 0500 12/04/17 0500 12/05/17 0500  Weight: 56.2 kg (123 lb 14.4 oz) 58.6 kg (129 lb 3 oz) 57.4 kg (126 lb 8.7 oz)   General: Alert, Awake and Oriented to Time, Place and Person. Appear in mild distress, affect appropriate Eyes: PERRL, Conjunctiva normal ENT: Oral Mucosa clear moist Neck: no JVD, no Abnormal Mass Or lumps Cardiovascular: S1 and S2 Present, no Murmur, Peripheral Pulses Present Respiratory: normal respiratory effort, Bilateral Air entry equal and Decreased, no use of accessory muscle, Clear to Auscultation, no Crackles, no wheezes Abdomen: Bowel Sound present, Soft and no tenderness, no hernia Skin: no redness, no Rash, no induration Extremities: no Pedal edema, no calf tenderness Neurologic: Grossly no focal neuro deficit. Bilaterally Equal motor strength  Data Reviewed: CBC: Recent Labs  Lab 12/02/17 0612 12/03/17 0340 12/04/17 0358 12/05/17 0310 12/06/17 0327  WBC  9.7 8.9 7.0 8.5 8.7  NEUTROABS  --  5.0  --   --   --   HGB 12.6 12.4 12.3 12.9 12.2  HCT 38.4 38.4 38.5 41.3 38.7  MCV 95.8 96.7 97.2 99.3 98.0  PLT 201 230 229 249 238   Basic Metabolic Panel: Recent Labs  Lab 12/01/17 0732 12/02/17 0612 12/02/17 1212 12/03/17 0340 12/04/17 0358 12/06/17 0327  NA 139 137  --  136 136 138  K 3.7 3.4*  --  3.7 3.9 3.9  CL 111 107  --  107 105 105  CO2 23 22  --  25 25 27   GLUCOSE 166* 146*  --  108* 136* 142*  BUN 14 5*  --  6* 11 17  CREATININE 0.79 0.65  --  0.64 0.62 0.72  CALCIUM 8.3* 8.0*  --  8.2* 8.2* 8.5*  MG 1.7  --  1.7 2.2 1.9 2.0  PHOS  --   --  2.1* 3.1 3.4 3.1    Liver Function Tests: Recent Labs  Lab 11/29/17 2248 11/30/17 0225 12/01/17 0732 12/03/17 0340 12/06/17 0327  AST 25 22 16  14* 21  ALT 17 16 12 11 16   ALKPHOS 102 98 69 69 66  BILITOT 1.6* 1.5* 0.8 1.0 0.6  PROT 6.9 6.8 4.8* 4.8* 5.0*  ALBUMIN 3.8 3.6 2.6* 2.5* 2.5*   Recent Labs  Lab 11/29/17 2248  LIPASE 50   No results for input(s): AMMONIA in the last 168 hours. Coagulation Profile: Recent Labs  Lab 12/01/17 0839 12/01/17 1550 12/01/17 1710 12/02/17 0612 12/03/17 0340  INR 5.35* 1.92 1.80 1.28 1.00   Cardiac Enzymes: No results for input(s): CKTOTAL, CKMB, CKMBINDEX, TROPONINI in the last 168 hours. BNP (last 3 results) No results for input(s): PROBNP in the last 8760 hours. CBG: Recent Labs  Lab 12/05/17 1156 12/05/17 1758 12/06/17 0002 12/06/17 0613 12/06/17 1202  GLUCAP 151* 137* 152* 168* 155*   Studies: No results found.  Scheduled Meds: . cholecalciferol  2,000 Units Oral Daily  . feeding supplement  1 Container Oral BID BM  .  HYDROmorphone (DILAUDID) injection  0.5 mg Intravenous Once  . insulin aspart  0-9 Units Subcutaneous Q6H  . losartan  50 mg Oral Daily  . mouth rinse  15 mL Mouth Rinse BID  . metoprolol tartrate  5 mg Intravenous Q4H  . pantoprazole  40 mg Intravenous Q12H  . sodium chloride flush  10-40 mL  Intracatheter Q12H   Continuous Infusions: . heparin 1,050 Units/hr (12/06/17 0757)  . TPN ADULT (ION) 60 mL/hr at 12/05/17 1834  . TPN ADULT (ION)     PRN Meds: acetaminophen **OR** acetaminophen, bisacodyl, dicyclomine, hydrALAZINE, morphine injection, ondansetron **OR** ondansetron (ZOFRAN) IV, promethazine, QUEtiapine, senna-docusate, sodium chloride flush  Time spent: 35 minutes  Author: Lynden Oxford, MD [facility] Pager: [phone] 12/06/2017 1:37 PM  If 7PM-7AM, please contact night-coverage at [email or web address], password [facility]

## 2017-12-06 NOTE — Progress Notes (Signed)
ANTICOAGULATION CONSULT NOTE - Follow Up Consult  Pharmacy Consult:  Heparin Indication: atrial fibrillation and pulmonary embolus  Allergies  Allergen Reactions  . Ace Inhibitors Other (See Comments)    Cough  . Fosamax [Alendronate Sodium] Other (See Comments)    Heart burn  . Norvasc [Amlodipine Besylate] Other (See Comments)    edema  . Zocor [Simvastatin] Other (See Comments)    Elevated CPK    Patient Measurements: Height: 5\' 5"  (165.1 cm) Weight: 126 lb 8.7 oz (57.4 kg) IBW/kg (Calculated) : 57  Vital Signs: Temp: 97.6 F (36.4 C) (07/11 0623) Temp Source: Oral (07/11 0623) BP: 147/64 (07/11 0623) Pulse Rate: 59 (07/11 0623)  Labs: Recent Labs    12/04/17 0358 12/05/17 0310 12/05/17 1255 12/06/17 0327  HGB 12.3 12.9  --  12.2  HCT 38.5 41.3  --  38.7  PLT 229 249  --  238  HEPARINUNFRC 0.36 0.12* 0.40 0.23*  CREATININE 0.62  --   --  0.72    Estimated Creatinine Clearance: 42.1 mL/min (by C-G formula based on SCr of 0.72 mg/dL).  Assessment: [age] YOF with history of afib on warfarin PTA - currently on hold as patient with N/V and recent coffee ground emesis with no bleeding noted. GI ok with heparin.  Heparin level = 0.23 this AM   Goal of Therapy:  Heparin level 0.3-0.5 units/ml Monitor platelets by anticoagulation protocol: Yes    Plan:  Increase heparin to 1050 units / hr 8 hour heparin level Daily heparin level and CBC  Thank you Okey RegalLisa Raeley Gilmore, PharmD  12/06/2017, 7:28 AM

## 2017-12-06 NOTE — Progress Notes (Signed)
ANTICOAGULATION CONSULT NOTE - Follow Up Consult  Pharmacy Consult:  Heparin Indication: atrial fibrillation and pulmonary embolus  Allergies  Allergen Reactions  . Ace Inhibitors Other (See Comments)    Cough  . Fosamax [Alendronate Sodium] Other (See Comments)    Heart burn  . Norvasc [Amlodipine Besylate] Other (See Comments)    edema  . Zocor [Simvastatin] Other (See Comments)    Elevated CPK    Patient Measurements: Height: 5\' 5"  (165.1 cm) Weight: 126 lb 8.7 oz (57.4 kg) IBW/kg (Calculated) : 57  Vital Signs: Temp: 97.7 F (36.5 C) (07/11 1330) Temp Source: Oral (07/11 1330) BP: 150/90 (07/11 1330) Pulse Rate: 57 (07/11 1330)  Labs: Recent Labs    12/04/17 0358 12/05/17 0310 12/05/17 1255 12/06/17 0327 12/06/17 1544  HGB 12.3 12.9  --  12.2  --   HCT 38.5 41.3  --  38.7  --   PLT 229 249  --  238  --   HEPARINUNFRC 0.36 0.12* 0.40 0.23* 0.47  CREATININE 0.62  --   --  0.72  --     Estimated Creatinine Clearance: 42.1 mL/min (by C-G formula based on SCr of 0.72 mg/dL).  Assessment: [age] YOF with history of afib on warfarin PTA - currently on hold as patient with N/V and recent coffee ground emesis with no bleeding noted. GI ok with heparin.  Heparin level = 0.47 this afternoon, In range   Goal of Therapy:  Heparin level 0.3-0.5 units/ml Monitor platelets by anticoagulation protocol: Yes    Plan:  Continue heparin at 1050 units / hr 8 hour confirmatory heparin level Daily heparin level and CBC  Shereena Berquist A. Jeanella CrazePierce, PharmD, BCPS Clinical Pharmacist [place] Pager: [phone] Please utilize Amion for appropriate phone number to reach the unit pharmacist [facility])    12/06/2017, 5:35 PM

## 2017-12-07 LAB — BASIC METABOLIC PANEL
Anion gap: 8 (ref 5–15)
BUN: 18 mg/dL (ref 8–23)
CALCIUM: 8.7 mg/dL — AB (ref 8.9–10.3)
CHLORIDE: 104 mmol/L (ref 98–111)
CO2: 26 mmol/L (ref 22–32)
CREATININE: 0.64 mg/dL (ref 0.44–1.00)
Glucose, Bld: 157 mg/dL — ABNORMAL HIGH (ref 70–99)
Potassium: 4.1 mmol/L (ref 3.5–5.1)
Sodium: 138 mmol/L (ref 135–145)

## 2017-12-07 LAB — GLUCOSE, CAPILLARY
GLUCOSE-CAPILLARY: 148 mg/dL — AB (ref 70–99)
GLUCOSE-CAPILLARY: 164 mg/dL — AB (ref 70–99)
Glucose-Capillary: 168 mg/dL — ABNORMAL HIGH (ref 70–99)
Glucose-Capillary: 143 mg/dL — ABNORMAL HIGH (ref 70–99)

## 2017-12-07 LAB — HEPARIN LEVEL (UNFRACTIONATED): HEPARIN UNFRACTIONATED: 0.52 [IU]/mL (ref 0.30–0.70)

## 2017-12-07 MED ORDER — ENSURE ENLIVE PO LIQD
237.0000 mL | Freq: Two times a day (BID) | ORAL | Status: DC
  Administered 2017-12-07 – 2017-12-11 (×7): 237 mL via ORAL

## 2017-12-07 MED ORDER — TRAVASOL 10 % IV SOLN
INTRAVENOUS | Status: AC
  Administered 2017-12-07: 18:00:00 via INTRAVENOUS
  Filled 2017-12-07: qty 403.2

## 2017-12-07 NOTE — Progress Notes (Signed)
PHARMACY - ADULT TOTAL PARENTERAL NUTRITION CONSULT NOTE   Pharmacy Consult:  TPN  Indication: Large hiatal hernia  Patient Measurements: Height: 5\' 5"  (165.1 cm) Weight: 125 lb 7.1 oz (56.9 kg) IBW/kg (Calculated) : 57 TPN AdjBW (KG): 55.4 Body mass index is 20.87 kg/m. Usual Weight: 54 kg  Assessment:  89 YOF with PMH significant for HTN, DM-diet controlled, CKD2, CVA, dementia, Afib on Coumadin PTA who had a recent admission 6/30-7/4 for large hiatal hernia. She represents this admission with worsening abdominal pain and persistent nausea and non-bloody vomiting. Patient did have some coffee-ground emesis but this has resolved.   Per son, patient was eating normally without weight loss. Since 6/30, patient was on liquid diet then soft diet but only taking in ~10-20% of her meal trays while admitted. After discharge on 7/4, she was able to eat a tiny amount of butternut squash soup and a few spoonfuls of applesauce x1 occurrence. No further intake since that time.   GI: prealbumin 10.3.  BM x2.  PPI IV BID, PRN Phenergan/Zofran.  Advancing diet, likely not getting surgery Endo: diet-controlled DM - CBGs controlled Insulin requirements in the past 24 hours: 6 units SSI Lytes: all WNL  Renal: SCr 0.64 stable, BUN WNL - UOP 0.6 ml/kg/hr, net ~ + 10L  Pulm: stable on RA Cards: BP improving, HR stable, EF 65-70% - losartan, IV metoprolol, PRN hydralazine Hepatobil: LFTs / tbili / TG WNL Neuro: CVA, dementia - A&O ID: afebrile, WBC WNL - not on abx Heme: Coumadin PTA for Afib >> Vit K 2.5mg  IV on 7/6 >> Heparin. Coffee-ground emesis is resolved. CBC WNL TPN Access: PICC placed 12/02/17 TPN start date: 12/02/17   Nutritional Goals (RD rec on 7/10): 1400-1550 kCal and 75-85gm protein per day  Current Nutrition:  TPN Full liquid diet - having grits and ice cream for breakfast and tolerating well Resource Breeze BID   Plan:  Decrease TPN to 30 ml/hr, providing 40g AA, 122.5g CHO and  17.5g ILE for a total of 750 kCal, meeting 50% of patient needs Electrolytes in TPN: no changes today, Cl:Ac 1:1 Daily multivitamin and trace elements in TPN Continue sensitive SSI Q6H F/U PO intake/diet advancement to wean off TPN   Nadara MustardNita Catlyn Shipton, PharmD., MS Clinical Pharmacist Pager:  [phone] Thank you for allowing pharmacy to be part of this patients care team. 12/07/2017, 10:17 AM

## 2017-12-07 NOTE — Progress Notes (Addendum)
[facility] Progress Note     Subjective: CC-  Sitting up in chair. Doing well this morning. Eating grits and ice cream for breakfast with no n/v or abdominal pain. States that she had some pain yesterday evening, thinks that it was the acidity from tomato soup.   Objective: Vital signs in last 24 hours: Temp:  [97.7 F (36.5 C)-98.2 F (36.8 C)] 98.1 F (36.7 C) (07/12 0528) Pulse Rate:  [57-73] 62 (07/12 0528) Resp:  [16-19] 16 (07/12 0528) BP: (148-175)/(68-90) 148/68 (07/12 0528) SpO2:  [94 %-96 %] 94 % (07/12 0528) Weight:  [125 lb 7.1 oz (56.9 kg)] 125 lb 7.1 oz (56.9 kg) (07/12 0439) Last BM Date: 12/05/17  Intake/Output from previous day: 07/11 0701 - 07/12 0700 In: 1866.1 [P.O.:160; I.V.:1706.1] Out: 1350 [Urine:1350] Intake/Output this shift: Total I/O In: -  Out: 200 [Urine:200]  PE: Gen: Alert, NAD HEENT: EOM's intact, pupils equal and round Cardio: irregularly irregular Pulm: CTAB, no W/R/R, effort normal Abd: Soft,mild distension,nontender, +BS ZOX:WRUEAV soft and nontender Skin: no rashes noted, warm and dry  Lab Results:  Recent Labs    12/05/17 0310 12/06/17 0327  WBC 8.5 8.7  HGB 12.9 12.2  HCT 41.3 38.7  PLT 249 238   BMET Recent Labs    12/06/17 0327 12/07/17 0600  NA 138 138  K 3.9 4.1  CL 105 104  CO2 27 26  GLUCOSE 142* 157*  BUN 17 18  CREATININE 0.72 0.64  CALCIUM 8.5* 8.7*   PT/INR No results for input(s): LABPROT, INR in the last 72 hours. CMP     Component Value Date/Time   NA 138 12/07/2017 0600   K 4.1 12/07/2017 0600   CL 104 12/07/2017 0600   CO2 26 12/07/2017 0600   GLUCOSE 157 (H) 12/07/2017 0600   BUN 18 12/07/2017 0600   CREATININE 0.64 12/07/2017 0600   CREATININE 1.03 (H) 11/08/2017 1100   CALCIUM 8.7 (L) 12/07/2017 0600   PROT 5.0 (L) 12/06/2017 0327   ALBUMIN 2.5 (L) 12/06/2017 0327   AST 21 12/06/2017 0327   ALT 16 12/06/2017 0327   ALKPHOS 66 12/06/2017 0327   BILITOT 0.6  12/06/2017 0327   GFRNONAA >60 12/07/2017 0600   GFRNONAA 48 (L) 11/08/2017 1100   GFRAA >60 12/07/2017 0600   GFRAA 55 (L) 11/08/2017 1100   Lipase     Component Value Date/Time   LIPASE 50 11/29/2017 2248       Studies/Results: No results found.  Anti-infectives: Anti-infectives (From admission, onward)   None       Assessment/Plan DM HTN CKD-II Afib - on coumadin H/o CVA Code status DNR  Intractable n/v, abd pain Large hiatal hernia - EGD7/3showed web without significant obstruction / PEH without obstruction or ischemia -readmitted 7/4 with persistent n/v - per cardiology patient is high risk forperioperative cardiac complications and is at a low functional capacity  ID -none FEN - 1/2 TNA,FLD, Boost VTE -SCDs,heparin drip,holdcoumadin Foley -none  Plan -Patient currently stable and tolerating fulls.   Will continue FLD and wean TPN to 1/2 today. Continue therapies and mobilization. Hold coumadin.  If patient does not digress and is able to be discharged in a few days, will likely need to stay on full liquids for 1-2 weeks; will need outpatient follow up with Dr. Ezzard Standing.   LOS: 7 days   Franne Forts , [facility] 12/07/2017, 9:25 AM Pager: [phone] Consults: [phone]  Agree with above. Seem to be  getting by with medical management of hiatal hernia for now.  Ovidio Kinavid Laelah Siravo, MD, [facility] Pager: [phone] Office phone:  [phone]

## 2017-12-07 NOTE — Care Management Note (Signed)
Case Management Note  Patient Details  Name: Pati GalloFrances H Pichette MRN: [id number] Date of Birth: February 13, 1925  Subjective/Objective:                    Action/Plan:  Patient from independent [facility] . Spoke to patient and son at bedside. Patient already has walker at home. Son has not decided on home health agency . He is going to talk to [facility] . Expected Discharge Date:                  Expected Discharge Plan:     In-House Referral:  Clinical Social Work  Discharge planning Services  CM Consult  Post Acute Care Choice:    Choice offered to:  Patient, Adult Children  DME Arranged:  N/A DME Agency:  NA  HH Arranged:  PT HH Agency:     Status of Service:  In process, will continue to follow  If discussed at Long Length of Stay Meetings, dates discussed:    Additional Comments:  Kingsley PlanWile, Shaton Lore Marie, RN 12/07/2017, 10:58 AM

## 2017-12-07 NOTE — Progress Notes (Signed)
[facility] Progress Note  Patient: Lisa GalloFrances H Hopkins ONG:[id number]   PCP: Lucky CowboyMcKeown, William, MD DOB: 01/10/1925   DOA: 11/29/2017   DOS: 12/07/2017   Date of Service: the patient was seen and examined on 12/07/2017  Subjective: Feeling better, sitting up in the chair.  No nausea no vomiting.  No diarrhea.  No burning urination.  Brief hospital course: Brooke BonitoFrances H Foglemanis a 84 y.o.femalewith medical history significant forhypertension, history of diabetes now diet controlled, chronic kidney disease stage II, history of CVA, dementia, and atrial fibrillation on warfarin, now presenting to the emergency department for evaluation of upper abdominal pain, nausea, and nonbloody vomiting. Patient was admitted to the hospital from 11/25/2017 until 11/29/2017 for the same symptoms, had GI and surgical consultations, underwent CT of the abdomen and pelvis and EGD, and her complaints were attributed to large hiatal hernia. She improved with medical management during the hospitalization, was discharged in much improved condition, and was planning to follow-up with surgery as an outpatient for possible surgical treatment. Unfortunately, soon after leaving the hospital, her pain became severe again and she had recurrence and nausea with nonbloody vomiting. She reports the symptoms to be the same as those during the recent hospitalization, denies fevers or chills, and denies chest pain or shortness of breath. Her son is at the bedside and assists with the history.  ED Course:Upon arrival to the ED, patient is found to be afebrile, saturating well on room air, mildly hypertensive, and vitals otherwise normal. Chemistry panel is notable for a potassium of 3.3 and bilirubin 1.6. CBC is notable for a mild polycythemia. Patient was given 500 cc normal saline, IV morphine, and IV Zofran in the ED. She had transient improvement in her symptoms with these measures in the ED, but pain became unbearable and she  developed recurrent nausea with vomiting within about an hour.   Admitted with recurrent nausea, vomiting, abdominal pain 24 hours after recent admission. Symptoms are thought to be related to large hiatal hernia. She was evaluated by cardiology who thought patient was high risk, but ok to proceed with surgery if needed. She was evaluated by surgery, Dr Ezzard Standingnewman wants to give another trial of clears, if patient fail she might need sx.    Currently further plan is continue to see if the patient can tolerate advancing of the diet and if not consider surgical option versus comfort.  Assessment and Plan: 1-Persistent Nausea vomiting, abdominal pain, Para esophageal Hiatal hernia.  Came back with nausea, vomiting.  -Endoscopy 7-3; showed proximal esophageal wed, stomach shows hiatal hernia no ulcer.  -surgery and GI consulted again. Plan for surgery next week.  -IV Zofran and phenergan PRN for nausea.  Cardiology consulted for pre op eval. Echo normal EF, mitral and tricuspid regurgitation. Follow cardio rec. Per cardiology if surgery needed, could proceed, patient is optimized cardiac stand point.  Discussed with sx, started  TPN for nutrition. TPN reduced to half today. Continue on full liquid diet and monitor progress.   She will likely stay on full liquid diet lifelong going forward.  Coffee ground emesis;  Initially on IV Protonix gtt. Now on protonix IV BID>  PRN Zofran for nausea.  IV fluids.  GI notified of readmission and new problem.  KUB negative for obstruction.  If abdominal pain, and vomiting gets worse will need CT abdomen, per GI rec.  HB has remain stable.  No further vomiting.   History of CVA; A fib; will need to be on heparin when  INR less than 2.  Now on heparin Gtt  CKD stage 2; stable   Chronic A fib;  On metoprolol at home. IV metoprolol BID>   HTN; continue with PRN hydralazine. Marland Kitchen Resume cozaar now she has not been vomiting.   Diet: full liquid diet DVT  Prophylaxis: on therapeutic anticoagulation.  Advance goals of care discussion: DNRDNI  Family Communication: no family was present at bedside, at the time of interview.   Disposition:  Discharge to SNF.  Consultants: General surgery  Gastroenterology   Procedures: none  Antibiotics: Anti-infectives (From admission, onward)   None       Objective: Physical Exam: Vitals:   12/07/17 0439 12/07/17 0528 12/07/17 1402 12/07/17 1604  BP:  (!) 148/68 130/79 (!) 161/86  Pulse:  62 79 62  Resp:  16 15   Temp:  98.1 F (36.7 C) 98 F (36.7 C)   TempSrc:  Oral Oral   SpO2:  94% 95%   Weight: 56.9 kg (125 lb 7.1 oz)     Height:        Intake/Output Summary (Last 24 hours) at 12/07/2017 1737 Last data filed at 12/07/2017 1522 Gross per 24 hour  Intake 1589.94 ml  Output 1550 ml  Net 39.94 ml   Filed Weights   12/04/17 0500 12/05/17 0500 12/07/17 0439  Weight: 58.6 kg (129 lb 3 oz) 57.4 kg (126 lb 8.7 oz) 56.9 kg (125 lb 7.1 oz)   General: Alert, Awake and Oriented to Time, Place and Person. Appear in mild distress, affect appropriate Eyes: PERRL, Conjunctiva normal ENT: Oral Mucosa clear moist Neck: no JVD, no Abnormal Mass Or lumps Cardiovascular: S1 and S2 Present, no Murmur, Peripheral Pulses Present Respiratory: normal respiratory effort, Bilateral Air entry equal and Decreased, no use of accessory muscle, Clear to Auscultation, no Crackles, no wheezes Abdomen: Bowel Sound present, Soft and no tenderness, no hernia Skin: no redness, no Rash, no induration Extremities: no Pedal edema, no calf tenderness Neurologic: Grossly no focal neuro deficit. Bilaterally Equal motor strength  Data Reviewed: CBC: Recent Labs  Lab 12/02/17 0612 12/03/17 0340 12/04/17 0358 12/05/17 0310 12/06/17 0327  WBC 9.7 8.9 7.0 8.5 8.7  NEUTROABS  --  5.0  --   --   --   HGB 12.6 12.4 12.3 12.9 12.2  HCT 38.4 38.4 38.5 41.3 38.7  MCV 95.8 96.7 97.2 99.3 98.0  PLT 201 230 229 249 238     Basic Metabolic Panel: Recent Labs  Lab 12/01/17 0732 12/02/17 0612 12/02/17 1212 12/03/17 0340 12/04/17 0358 12/06/17 0327 12/07/17 0600  NA 139 137  --  136 136 138 138  K 3.7 3.4*  --  3.7 3.9 3.9 4.1  CL 111 107  --  107 105 105 104  CO2 23 22  --  25 25 27 26   GLUCOSE 166* 146*  --  108* 136* 142* 157*  BUN 14 5*  --  6* 11 17 18   CREATININE 0.79 0.65  --  0.64 0.62 0.72 0.64  CALCIUM 8.3* 8.0*  --  8.2* 8.2* 8.5* 8.7*  MG 1.7  --  1.7 2.2 1.9 2.0  --   PHOS  --   --  2.1* 3.1 3.4 3.1  --     Liver Function Tests: Recent Labs  Lab 12/01/17 0732 12/03/17 0340 12/06/17 0327  AST 16 14* 21  ALT 12 11 16   ALKPHOS 69 69 66  BILITOT 0.8 1.0 0.6  PROT 4.8* 4.8* 5.0*  ALBUMIN 2.6* 2.5* 2.5*   No results for input(s): LIPASE, AMYLASE in the last 168 hours. No results for input(s): AMMONIA in the last 168 hours. Coagulation Profile: Recent Labs  Lab 12/01/17 0839 12/01/17 1550 12/01/17 1710 12/02/17 0612 12/03/17 0340  INR 5.35* 1.92 1.80 1.28 1.00   Cardiac Enzymes: No results for input(s): CKTOTAL, CKMB, CKMBINDEX, TROPONINI in the last 168 hours. BNP (last 3 results) No results for input(s): PROBNP in the last 8760 hours. CBG: Recent Labs  Lab 12/06/17 1818 12/07/17 0027 12/07/17 0619 12/07/17 1146 12/07/17 1559  GLUCAP 144* 164* 148* 168* 143*   Studies: No results found.  Scheduled Meds: . cholecalciferol  2,000 Units Oral Daily  . feeding supplement (ENSURE ENLIVE)  237 mL Oral BID BM  .  HYDROmorphone (DILAUDID) injection  0.5 mg Intravenous Once  . insulin aspart  0-9 Units Subcutaneous Q6H  . losartan  50 mg Oral Daily  . mouth rinse  15 mL Mouth Rinse BID  . metoprolol tartrate  5 mg Intravenous Q4H  . pantoprazole  40 mg Intravenous Q12H  . sodium chloride flush  10-40 mL Intracatheter Q12H   Continuous Infusions: . heparin 1,000 Units/hr (12/07/17 0700)  . TPN ADULT (ION) 60 mL/hr at 12/07/17 0700  . TPN ADULT (ION) 30 mL/hr at  12/07/17 1735   PRN Meds: acetaminophen **OR** acetaminophen, bisacodyl, dicyclomine, hydrALAZINE, morphine injection, ondansetron **OR** ondansetron (ZOFRAN) IV, promethazine, QUEtiapine, senna-docusate, sodium chloride flush  Time spent: 35 minutes  Author: Lynden Oxford, MD [facility] Pager: [phone] 12/07/2017 5:37 PM  If 7PM-7AM, please contact night-coverage at [email or web address], password [facility]

## 2017-12-07 NOTE — Progress Notes (Addendum)
Nutrition Follow-up  DOCUMENTATION CODES:   Not applicable  INTERVENTION:   -D/c Boost Breeze po BID, each supplement provides 250 kcal and 9 grams of protein -Ensure Enlive po BID, each supplement provides 350 kcal and 20 grams of protein -TPN management per pharmacy  NUTRITION DIAGNOSIS:   Inadequate oral intake related to altered GI function as evidenced by NPO status.  Progressing; advanced to full liquid diet on 12/06/17  GOAL:   Patient will meet greater than or equal to 90% of their needs  Met with TPN  MONITOR:   Diet advancement, Labs, Weight trends, TF tolerance, I & O's  REASON FOR ASSESSMENT:   Consult New TPN/TNA  ASSESSMENT:   Lisa Hopkins is a 84 y.o. female with medical history significant for hypertension, history of diabetes now diet controlled, chronic kidney disease stage II, history of CVA, dementia, and atrial fibrillation on warfarin, now presenting to the emergency department for evaluation of upper abdominal pain, nausea, and nonbloody vomiting.  7/7- s/p PICC line placement, TPN initiated 7/9- advanced to clear liquids,  7/11-Boost Breeze ordered BID, advanced to full liquids  Pt just advanced to full liquids on 12/06/17. Pt is tolerating diet well- consuming approximately 50-75% of meals. Pt consumed grits and ice cream for breakfast without difficulty.   Pt continues to receive TPN- infusing at 60 ml/hr, which provides 1500 kcals and 80 grams of protein, meeting 100% of needs. Per pharmacy notes, plan to decreased TPN down to 30 m/hr at 1800, which provides 750 kcals and 40 grams protein, meeting 52% of estimated kcal needs and 57% of estimated protein needs.   Per general surgery notes, pt will likely need to stay on fa full liquid diet for 1-2 weeks. RD will upgrade pt to Ensure Enlive supplement for increased nutrient density.   Labs reviewed: CBGS: 144-164 (inpatient orders for glycemic control are 0-9 units insulin aspart every 6  hours)  Diet Order:   Diet Order           Diet full liquid Room service appropriate? Yes; Fluid consistency: Thin  Diet effective now          EDUCATION NEEDS:   Education needs have been addressed  Skin:  Skin Assessment: Reviewed RN Assessment  Last BM:  12/05/17  Height:   Ht Readings from Last 1 Encounters:  11/30/17 _0  (1.651 m)    Weight:   Wt Readings from Last 1 Encounters:  12/07/17 125 lb 7.1 oz (56.9 kg)    Ideal Body Weight:  56.8 kg  BMI:  Body mass index is 20.87 kg/m.  Estimated Nutritional Needs:   Kcal:  1450-1650  Protein:  70-85 grams  Fluid:  > 1.4 L    Daziah Hesler A. Jimmye Norman, RD, LDN, CDE Pager: [phone] After hours Pager: [phone]

## 2017-12-07 NOTE — Progress Notes (Signed)
Physical Therapy Treatment Patient Details Name: Lisa Hopkins MRN: [id number] DOB: [birth year] Today's Date: 12/07/2017    History of Present Illness Pt is a 84 y/o female with a PMH significant for HTN, history of diabetes now diet controlled, CKDII, CVA, dementia, and a-fib on warfarin, now presenting to the emergency department for evaluation of upper abdominal pain, nausea, and nonbloody vomiting.  Patient was admitted to the hospital from 11/25/2017 until 11/29/2017 for the same symptoms, underwent CT of the abdomen/pelvis and EGD, and she was found to have a large hiatal hernia. She was discharged in much improved condition, and was planning to follow-up with surgery as an outpatient for possible surgical treatment.  Unfortunately, soon after leaving the hospital, her pain became severe again and she had recurrence and nausea with nonbloody vomiting.     PT Comments    Pt progressing towards physical therapy goals. Was able to ambulate fairly well today however with decreased awareness of safety and obstacles in her path during gait training. Has been using a rollator at home however feel a RW would provide more safety and support for the patient at this time. Will continue to follow and progress as able per POC.    Follow Up Recommendations  Home health PT;Supervision for mobility/OOB(Ind Living vs ALF at [facility])     Equipment Recommendations  Rolling walker with 5" wheels    Recommendations for Other Services OT consult     Precautions / Restrictions Precautions Precautions: Fall Restrictions Weight Bearing Restrictions: No    Mobility  Bed Mobility               General bed mobility comments: Pt sitting up in recliner upon PT arrival.   Transfers Overall transfer level: Needs assistance Equipment used: Rolling walker (2 wheeled) Transfers: Sit to/from Stand Sit to Stand: Supervision         General transfer comment: Pt attempting to stand prior to RW  being right in front of her. Reinforced education.   Ambulation/Gait Ambulation/Gait assistance: Min assist Gait Distance (Feet): 225 Feet Assistive device: Rolling walker (2 wheeled) Gait Pattern/deviations: Step-through pattern;Decreased stride length;Trunk flexed;Narrow base of support Gait velocity: Decreased Gait velocity interpretation: <1.31 ft/sec, indicative of household ambulator General Gait Details: Generally slow with decreased safety awareness. VC's for improved posture and forward gaze however pt continually looking at the ground and running into obstacles with the RW.    Stairs             Wheelchair Mobility    Modified Rankin (Stroke Patients Only)       Balance Overall balance assessment: Needs assistance Sitting-balance support: Feet supported;No upper extremity supported Sitting balance-Leahy Scale: Good   Postural control: Posterior lean Standing balance support: Bilateral upper extremity supported;During functional activity Standing balance-Leahy Scale: Poor Standing balance comment: Reliant on BUE support during ambulation                            Cognition Arousal/Alertness: Awake/alert Behavior During Therapy: WFL for tasks assessed/performed Overall Cognitive Status: History of cognitive impairments - at baseline                                 General Comments: Baseline dementia per chart review      Exercises      General Comments        Pertinent Vitals/Pain Pain Assessment: No/denies  pain Faces Pain Scale: No hurt    Home Living                      Prior Function            PT Goals (current goals can now be found in the care plan section) Acute Rehab PT Goals Patient Stated Goal: to go home PT Goal Formulation: With patient/family Time For Goal Achievement: 12/17/17 Potential to Achieve Goals: Good Progress towards PT goals: Progressing toward goals    Frequency    Min  3X/week      PT Plan Current plan remains appropriate    Co-evaluation              AM-PAC PT "6 Clicks" Daily Activity  Outcome Measure  Difficulty turning over in bed (including adjusting bedclothes, sheets and blankets)?: A Little Difficulty moving from lying on back to sitting on the side of the bed? : A Little Difficulty sitting down on and standing up from a chair with arms (e.g., wheelchair, bedside commode, etc,.)?: A Little Help needed moving to and from a bed to chair (including a wheelchair)?: A Little Help needed walking in hospital room?: A Little Help needed climbing 3-5 steps with a railing? : A Lot 6 Click Score: 17    End of Session Equipment Utilized During Treatment: Gait belt Activity Tolerance: Patient tolerated treatment well Patient left: in chair;with call bell/phone within reach Nurse Communication: Mobility status;Other (comment)(No chair alarm box in room) PT Visit Diagnosis: Unsteadiness on feet (R26.81);Other abnormalities of gait and mobility (R26.89)     Time: 9147-8295 PT Time Calculation (min) (ACUTE ONLY): 12 min  Charges:  $Gait Training: 8-22 mins                    G Codes:       Conni Slipper, PT, DPT Acute Rehabilitation Services Pager: [phone]    Marylynn Pearson 12/07/2017, 2:52 PM

## 2017-12-07 NOTE — Progress Notes (Addendum)
ANTICOAGULATION CONSULT NOTE - Follow Up Consult  Pharmacy Consult:  Heparin Indication: atrial fibrillation and pulmonary embolus  Allergies  Allergen Reactions  . Ace Inhibitors Other (See Comments)    Cough  . Fosamax [Alendronate Sodium] Other (See Comments)    Heart burn  . Norvasc [Amlodipine Besylate] Other (See Comments)    edema  . Zocor [Simvastatin] Other (See Comments)    Elevated CPK    Patient Measurements: Height: 5\' 5"  (165.1 cm) Weight: 126 lb 8.7 oz (57.4 kg) IBW/kg (Calculated) : 57  Vital Signs: Temp: 98.2 F (36.8 C) (07/11 2159) Temp Source: Oral (07/11 2159) BP: 175/81 (07/11 2159) Pulse Rate: 73 (07/11 2159)  Labs: Recent Labs    12/04/17 0358 12/05/17 0310  12/06/17 0327 12/06/17 1544 12/07/17 0047  HGB 12.3 12.9  --  12.2  --   --   HCT 38.5 41.3  --  38.7  --   --   PLT 229 249  --  238  --   --   HEPARINUNFRC 0.36 0.12*   < > 0.23* 0.47 0.52  CREATININE 0.62  --   --  0.72  --   --    < > = values in this interval not displayed.    Estimated Creatinine Clearance: 42.1 mL/min (by C-G formula based on SCr of 0.72 mg/dL).  Assessment: [age] YOF with history of afib on warfarin PTA - currently on hold as patient with N/V and recent coffee ground emesis with no bleeding noted. GI ok with heparin.  Heparin level this morning is slightly SUPRAtherapeutic of lower goal range (HL 0.52, goal of 0.3-0.5). No bleeding or issues with the drip noted.   Goal of Therapy:  Heparin level 0.3-0.5 units/ml Monitor platelets by anticoagulation protocol: Yes   Plan:  - Reduce Heparin to 1000 units/hr (10 ml/hr) - Follow up AM labs - Will continue to monitor for any signs/symptoms of bleeding  Thank you for allowing pharmacy to be a part of this patient's care.  Thank you Okey RegalLisa Rickeya Manus, PharmD [phone]

## 2017-12-07 NOTE — Social Work (Signed)
CSW met with pt son Clair Gulling, attempted to call Haynes Dage at [facility] to discuss discharge planning, Haynes Dage not available, left message for Kristen Cardinal.   Await return call. Pt son amenable to pt returning to her room with support from home health. RN case manager also aware and following.   Alexander Mt, [place] Work [phone]

## 2017-12-08 LAB — CBC
HCT: 36.4 % (ref 36.0–46.0)
Hemoglobin: 11.7 g/dL — ABNORMAL LOW (ref 12.0–15.0)
MCH: 31.3 pg (ref 26.0–34.0)
MCHC: 32.1 g/dL (ref 30.0–36.0)
MCV: 97.3 fL (ref 78.0–100.0)
PLATELETS: 222 10*3/uL (ref 150–400)
RBC: 3.74 MIL/uL — ABNORMAL LOW (ref 3.87–5.11)
RDW: 13.2 % (ref 11.5–15.5)
WBC: 11.5 10*3/uL — ABNORMAL HIGH (ref 4.0–10.5)

## 2017-12-08 LAB — BASIC METABOLIC PANEL
Anion gap: 7 (ref 5–15)
BUN: 17 mg/dL (ref 8–23)
CALCIUM: 9 mg/dL (ref 8.9–10.3)
CO2: 25 mmol/L (ref 22–32)
Chloride: 102 mmol/L (ref 98–111)
Creatinine, Ser: 0.7 mg/dL (ref 0.44–1.00)
GFR calc Af Amer: >60 mL/min (ref >60)
Glucose, Bld: 150 mg/dL — ABNORMAL HIGH (ref 70–99)
Potassium: 4.1 mmol/L (ref 3.5–5.1)
Sodium: 134 mmol/L — ABNORMAL LOW (ref 135–145)

## 2017-12-08 LAB — GLUCOSE, CAPILLARY
Glucose-Capillary: 113 mg/dL — ABNORMAL HIGH (ref 70–99)
Glucose-Capillary: 119 mg/dL — ABNORMAL HIGH (ref 70–99)
Glucose-Capillary: 125 mg/dL — ABNORMAL HIGH (ref 70–99)
Glucose-Capillary: 131 mg/dL — ABNORMAL HIGH (ref 70–99)
Glucose-Capillary: 152 mg/dL — ABNORMAL HIGH (ref 70–99)

## 2017-12-08 LAB — HEPARIN LEVEL (UNFRACTIONATED): Heparin Unfractionated: 0.39 IU/mL (ref 0.30–0.70)

## 2017-12-08 MED ORDER — PANTOPRAZOLE SODIUM 40 MG PO TBEC
40.0000 mg | DELAYED_RELEASE_TABLET | Freq: Two times a day (BID) | ORAL | Status: DC
  Administered 2017-12-08 – 2017-12-13 (×11): 40 mg via ORAL
  Filled 2017-12-08 (×11): qty 1

## 2017-12-08 NOTE — Progress Notes (Signed)
PHARMACY - ADULT TOTAL PARENTERAL NUTRITION CONSULT NOTE   Pharmacy Consult:  TPN  Indication: Large hiatal hernia  Patient Measurements: Height: 5\' 5"  (165.1 cm) Weight: 125 lb 7.1 oz (56.9 kg) IBW/kg (Calculated) : 57 TPN AdjBW (KG): 55.4 Body mass index is 20.87 kg/m. Usual Weight: 54 kg  Assessment:  89 YOF with PMH significant for HTN, DM-diet controlled, CKD2, CVA, dementia, Afib on Coumadin PTA who had a recent admission 6/30-7/4 for large hiatal hernia. She represents this admission with worsening abdominal pain and persistent nausea and non-bloody vomiting. Patient did have some coffee-ground emesis but this has resolved.   Per son, patient was eating normally without weight loss. Since 6/30, patient was on liquid diet then soft diet but only taking in ~10-20% of her meal trays while admitted. After discharge on 7/4, she was able to eat a tiny amount of butternut squash soup and a few spoonfuls of applesauce x1 occurrence. No further intake since that time.   GI: prealbumin 10.3.  BM x2.  PPI IV BID, PRN Phenergan/Zofran Endo: diet-controlled DM - CBGs well controlled Insulin requirements in the past 24 hours: 6 units SSI Lytes: all WNL except mild hyponatremia Renal: SCr 0.7 stable, BUN WNL - UOP 0.1 ml/kg/hr, net +11.3L  Pulm: stable on RA Cards: BP improving, HR stable, EF 65-70% - losartan, IV metoprolol, PRN hydralazine Hepatobil: LFTs / tbili / TG WNL Neuro: CVA, dementia - A&O ID: afebrile, WBC up to 11.5 - not on abx Heme: Coumadin PTA for Afib >> Vit K 2.5mg  IV on 7/6 >> Heparin. Coffee-ground emesis is resolved. HL 0.39, CBC WNL TPN Access: PICC placed 12/02/17 TPN start date: 12/02/17   Nutritional Goals (RD rec on 7/12): 1450-1650 kCal and 70-85gm protein per day  Current Nutrition:  TPN Full liquid diet - eating 50-75% of meals Ensure Enlive BID (1 charted given)   Plan:  D/C TPN per Surgery's progress note >> continue TPN at 30 ml/hr until 1800 today D/C  TPN labs and nursing care orders   Fraser Busche D. Laney Potashang, PharmD, BCPS, BCCCP 12/08/2017, 8:37 AM

## 2017-12-08 NOTE — Progress Notes (Signed)
PROGRESS NOTE    Lisa Hopkins  ZOX:[id number] DOB: [birth year] DOA: 11/29/2017 PCP: Lucky Cowboy, MD  Brief Narrative: 84 y.o.femalewith medical history significant forhypertension, history of diabetes now diet controlled, chronic kidney disease stage II, history of CVA, dementia, and atrial fibrillation on warfarin, now presenting to the emergency department for evaluation of upper abdominal pain, nausea, and nonbloody vomiting. Patient was admitted to the hospital from 11/25/2017 until 11/29/2017 for the same symptoms, had GI and surgical consultations, underwent CT of the abdomen and pelvis and EGD, and her complaints were attributed to large hiatal hernia. She improved with medical management during the hospitalization, was discharged in much improved condition, and was planning to follow-up with surgery as an outpatient for possible surgical treatment. Unfortunately, soon after leaving the hospital, her pain became severe again and she had recurrence and nausea with nonbloody vomiting. She reports the symptoms to be the same as those during the recent hospitalization, denies fevers or chills, and denies chest pain or shortness of breath. Her son is at the bedside and assists with the history.  ED Course:Upon arrival to the ED, patient is found to be afebrile, saturating well on room air, mildly hypertensive, and vitals otherwise normal. Chemistry panel is notable for a potassium of 3.3 and bilirubin 1.6. CBC is notable for a mild polycythemia. Patient was given 500 cc normal saline, IV morphine, and IV Zofran in the ED. She had transient improvement in her symptoms with these measures in the ED, but pain became unbearable and she developed recurrent nausea with vomiting within about an hour.  Admitted with recurrent nausea, vomiting, abdominal pain 24 hours after recent admission. Symptoms are thought to be related to large hiatal hernia. She was evaluated by cardiology who  thought patient was high risk, but ok to proceed with surgery if needed. She was evaluated by surgery, Dr Ezzard Standing wants to give another trial of clears, if patient fail she might need sx.   Assessment & Plan:   Principal Problem:   Intractable abdominal pain Active Problems:   Essential hypertension   Atrial fibrillation (HCC)   History of CVA (cerebrovascular accident)   CKD stage 2 due to type 2 diabetes mellitus (HCC)  1-Persistent Nausea vomiting, abdominal pain, Para esophageal Hiatal hernia.  Patient tolerating full liquid diet so far.  Plan is to avoid surgery as much as possible due to high perioperative cardiac risk. Came back with nausea, vomiting. -Endoscopy 7-3; showed proximal esophageal wed, stomach shows hiatal hernia no ulcer.  -surgery and GI consulted again. Plan for surgery next week. -IV Zofran and phenergan PRN for nausea.  Cardiology consulted for pre op eval. Echo normal EF, mitral and tricuspid regurgitation. Follow cardio rec. Per cardiology if surgery needed, could proceed, patient is optimized cardiac stand point.  Discussed with sx, started TPN for nutrition. TPN reduced to half today. Continue on full liquid diet and monitor progress.   She will likely stay on full liquid diet lifelong going forward.  Coffee ground emesis;  Initially on IV Protonix gtt. Now on protonix IV BID> PRN Zofran for nausea.  IV fluids.  GI notified of readmission and new problem.  KUB negative for obstruction.  If abdominal pain, and vomiting gets worse will need CT abdomen, per GI rec.  HB has remain stable.  No further vomiting.   History of CVA; A fib; will need to be on heparin when INR less than 2.  Now on heparin Gtt patient was on Coumadin at home.  CKD stage 2;stable   Chronic A fib;  On metoprolol at home. IV metoprolol BID>  HTN; continue with PRN hydralazine. Marland Kitchen. Resume cozaar now she has not been vomiting.        DVT prophylaxis: Heparin   Code Status: DO NOT RESUSCITATE Family Communation: No family available  Disposition Plan: Discharge to SNF once medically stable and able to take p.o. Consultants: General surgery Procedures: None Antimicrobials: None  Subjective: Coughing after eating sitting in the recliner.  Denies nausea vomiting.   Objective: Vitals:   12/07/17 1604 12/07/17 2148 12/08/17 0431 12/08/17 0839  BP: (!) 161/86 (!) 144/62 (!) 154/71 (!) 143/66  Pulse: 62 71 68 61  Resp:   16   Temp:  98.4 F (36.9 C) 97.6 F (36.4 C)   TempSrc:  Oral Oral   SpO2:  96% 94%   Weight:      Height:        Intake/Output Summary (Last 24 hours) at 12/08/2017 1202 Last data filed at 12/08/2017 0956 Gross per 24 hour  Intake 1049.6 ml  Output -  Net 1049.6 ml   Filed Weights   12/04/17 0500 12/05/17 0500 12/07/17 0439  Weight: 58.6 kg (129 lb 3 oz) 57.4 kg (126 lb 8.7 oz) 56.9 kg (125 lb 7.1 oz)    Examination:  General exam: Appears calm and comfortable  Respiratory system: Clear to auscultation. Respiratory effort normal. Cardiovascular system: S1 & S2 heard, RRR. No JVD, murmurs, rubs, gallops or clicks. No pedal edema. Gastrointestinal system: Abdomen is distended, soft and nontender. No organomegaly or masses felt. Normal bowel sounds heard. Central nervous system: Alert and oriented. No focal neurological deficits. Extremities: Symmetric 5 x 5 power. Skin: No rashes, lesions or ulcers Psychiatry: Judgement and insight appear normal. Mood & affect appropriate.     Data Reviewed:  CBC: Recent Labs  Lab 12/03/17 0340 12/04/17 0358 12/05/17 0310 12/06/17 0327 12/08/17 0316  WBC 8.9 7.0 8.5 8.7 11.5*  NEUTROABS 5.0  --   --   --   --   HGB 12.4 12.3 12.9 12.2 11.7*  HCT 38.4 38.5 41.3 38.7 36.4  MCV 96.7 97.2 99.3 98.0 97.3  PLT 230 229 249 238 222   Basic Metabolic Panel: Recent Labs  Lab 12/02/17 1212 12/03/17 0340 12/04/17 0358 12/06/17 0327 12/07/17 0600 12/08/17 0316  NA  --   136 136 138 138 134*  K  --  3.7 3.9 3.9 4.1 4.1  CL  --  107 105 105 104 102  CO2  --  25 25 27 26 25   GLUCOSE  --  108* 136* 142* 157* 150*  BUN  --  6* 11 17 18 17   CREATININE  --  0.64 0.62 0.72 0.64 0.70  CALCIUM  --  8.2* 8.2* 8.5* 8.7* 9.0  MG 1.7 2.2 1.9 2.0  --   --   PHOS 2.1* 3.1 3.4 3.1  --   --    GFR: Estimated Creatinine Clearance: 42 mL/min (by C-G formula based on SCr of 0.7 mg/dL). Liver Function Tests: Recent Labs  Lab 12/03/17 0340 12/06/17 0327  AST 14* 21  ALT 11 16  ALKPHOS 69 66  BILITOT 1.0 0.6  PROT 4.8* 5.0*  ALBUMIN 2.5* 2.5*   No results for input(s): LIPASE, AMYLASE in the last 168 hours. No results for input(s): AMMONIA in the last 168 hours. Coagulation Profile: Recent Labs  Lab 12/01/17 1550 12/01/17 1710 12/02/17 0612 12/03/17 0340  INR 1.92 1.80  1.28 1.00   Cardiac Enzymes: No results for input(s): CKTOTAL, CKMB, CKMBINDEX, TROPONINI in the last 168 hours. BNP (last 3 results) No results for input(s): PROBNP in the last 8760 hours. HbA1C: No results for input(s): HGBA1C in the last 72 hours. CBG: Recent Labs  Lab 12/07/17 0619 12/07/17 1146 12/07/17 1559 12/08/17 0020 12/08/17 0622  GLUCAP 148* 168* 143* 119* 125*   Lipid Profile: No results for input(s): CHOL, HDL, LDLCALC, TRIG, CHOLHDL, LDLDIRECT in the last 72 hours. Thyroid Function Tests: No results for input(s): TSH, T4TOTAL, FREET4, T3FREE, THYROIDAB in the last 72 hours. Anemia Panel: No results for input(s): VITAMINB12, FOLATE, FERRITIN, TIBC, IRON, RETICCTPCT in the last 72 hours. Sepsis Labs: No results for input(s): PROCALCITON, LATICACIDVEN in the last 168 hours.  No results found for this or any previous visit (from the past 240 hour(s)).       Radiology Studies: No results found.      Scheduled Meds: . cholecalciferol  2,000 Units Oral Daily  . feeding supplement (ENSURE ENLIVE)  237 mL Oral BID BM  .  HYDROmorphone (DILAUDID) injection   0.5 mg Intravenous Once  . insulin aspart  0-9 Units Subcutaneous Q6H  . losartan  50 mg Oral Daily  . mouth rinse  15 mL Mouth Rinse BID  . metoprolol tartrate  5 mg Intravenous Q4H  . pantoprazole  40 mg Oral BID  . sodium chloride flush  10-40 mL Intracatheter Q12H   Continuous Infusions: . heparin 1,000 Units/hr (12/07/17 1752)  . TPN ADULT (ION) 30 mL/hr at 12/07/17 1735     LOS: 8 days     Alwyn Ren, MD [facility]  If 7PM-7AM, please contact night-coverage [email or web address] Password [facility] 12/08/2017, 12:02 PM

## 2017-12-08 NOTE — Progress Notes (Signed)
General Surgery [facility]  Assessment & Plan: DM HTN CKD-II Afib - on coumadin H/o CVA Code status DNR Large hiatal hernia - EGD7/3showed web without significant obstruction / PEH without obstruction or ischemia -readmitted 7/4 with persistent n/v - per cardiology patient is high risk forperioperative cardiac complications and is at a low functional capacity FEN - 1/2 TNA,FLD, Boost VTE -SCDs,heparin drip,holdcoumadin  Plan:  Patient currently stable and tolerating full liquid diet             Will continue full liquid diet and wean TPN to off per pharmacy and medical services  Operative intervention not anticipated at this point  Discharge planning per family, case manager, and social work  Outpatient follow up with Dr. Baltazar Najjar, MD, [facility]       Office: [phone]    Chief Complaint: Abdominal pain, giant hiatal hernia (chronic)  Subjective: Patient up in chair, no family in room - cutting grass today.  No complaints.  Denies abdominal pain.  Taking limited full liquid diet.  Objective: Vital signs in last 24 hours: Temp:  [97.6 F (36.4 C)-98.4 F (36.9 C)] 97.6 F (36.4 C) (07/13 0431) Pulse Rate:  [62-79] 68 (07/13 0431) Resp:  [15-16] 16 (07/13 0431) BP: (130-161)/(62-86) 154/71 (07/13 0431) SpO2:  [94 %-96 %] 94 % (07/13 0431) Last BM Date: 12/07/17  Intake/Output from previous day: 07/12 0701 - 07/13 0700 In: 1269.6 [P.O.:700; I.V.:569.6] Out: 200 [Urine:200] Intake/Output this shift: No intake/output data recorded.  Physical Exam: HEENT - sclerae clear, mucous membranes moist Neck - soft Chest - clear bilaterally Cor - RRR Abdomen - protuberant, soft, non-tender; occasional BS present Neuro - alert & oriented, no focal deficits  Lab Results:  Recent Labs    12/06/17 0327 12/08/17 0316  WBC 8.7 11.5*  HGB 12.2  11.7*  HCT 38.7 36.4  PLT 238 222   BMET Recent Labs    12/07/17 0600 12/08/17 0316  NA 138 134*  K 4.1 4.1  CL 104 102  CO2 26 25  GLUCOSE 157* 150*  BUN 18 17  CREATININE 0.64 0.70  CALCIUM 8.7* 9.0   PT/INR No results for input(s): LABPROT, INR in the last 72 hours. Comprehensive Metabolic Panel:    Component Value Date/Time   NA 134 (L) 12/08/2017 0316   NA 138 12/07/2017 0600   K 4.1 12/08/2017 0316   K 4.1 12/07/2017 0600   CL 102 12/08/2017 0316   CL 104 12/07/2017 0600   CO2 25 12/08/2017 0316   CO2 26 12/07/2017 0600   BUN 17 12/08/2017 0316   BUN 18 12/07/2017 0600   CREATININE 0.70 12/08/2017 0316   CREATININE 0.64 12/07/2017 0600   CREATININE 1.03 (H) 11/08/2017 1100   CREATININE 1.00 (H) 08/03/2017 1113   GLUCOSE 150 (H) 12/08/2017 0316   GLUCOSE 157 (H) 12/07/2017 0600   CALCIUM 9.0 12/08/2017 0316   CALCIUM 8.7 (L) 12/07/2017 0600   AST 21 12/06/2017 0327   AST 14 (L) 12/03/2017 0340   ALT 16 12/06/2017 0327   ALT 11 12/03/2017 0340   ALKPHOS 66 12/06/2017 0327   ALKPHOS 69 12/03/2017 0340   BILITOT 0.6 12/06/2017 0327   BILITOT 1.0 12/03/2017 0340   PROT 5.0 (L) 12/06/2017 0327   PROT 4.8 (L) 12/03/2017 0340   ALBUMIN 2.5 (L) 12/06/2017 0327   ALBUMIN 2.5 (L)  12/03/2017 0340    Studies/Results: No results found.    Lisa Hopkins 12/08/2017  Patient ID: Lisa Hopkins, female   DOB: Oct 21, 1924, 84 y.o.   MRN: [id number]

## 2017-12-08 NOTE — Plan of Care (Signed)
  Problem: Clinical Measurements: Goal: Respiratory complications will improve Outcome: Progressing Goal: Cardiovascular complication will be avoided Outcome: Progressing   Problem: Activity: Goal: Risk for activity intolerance will decrease Outcome: Progressing   Problem: Nutrition: Goal: Adequate nutrition will be maintained Outcome: Progressing   

## 2017-12-08 NOTE — Progress Notes (Signed)
ANTICOAGULATION CONSULT NOTE - Follow Up Consult  Pharmacy Consult:  Heparin Indication: atrial fibrillation and pulmonary embolus  Allergies  Allergen Reactions  . Ace Inhibitors Other (See Comments)    Cough  . Fosamax [Alendronate Sodium] Other (See Comments)    Heart burn  . Norvasc [Amlodipine Besylate] Other (See Comments)    edema  . Zocor [Simvastatin] Other (See Comments)    Elevated CPK    Patient Measurements: Height: 5\' 5"  (165.1 cm) Weight: 125 lb 7.1 oz (56.9 kg) IBW/kg (Calculated) : 57  Vital Signs: Temp: 97.6 F (36.4 C) (07/13 0431) Temp Source: Oral (07/13 0431) BP: 154/71 (07/13 0431) Pulse Rate: 68 (07/13 0431)  Labs: Recent Labs    12/06/17 0327 12/06/17 1544 12/07/17 0047 12/07/17 0600 12/08/17 0316  HGB 12.2  --   --   --  11.7*  HCT 38.7  --   --   --  36.4  PLT 238  --   --   --  222  HEPARINUNFRC 0.23* 0.47 0.52  --  0.39  CREATININE 0.72  --   --  0.64 0.70    Estimated Creatinine Clearance: 42 mL/min (by C-G formula based on SCr of 0.7 mg/dL).   Assessment: [age] YOF with history of afib on warfarin PTA - currently on hold as patient with N/V and recent coffee ground emesis with no bleeding noted. GI ok with heparin.  Heparin level therapeutic; no bleeding reported.   Goal of Therapy:  Heparin level 0.3-0.5 units/ml Monitor platelets by anticoagulation protocol: Yes    Plan:  Continue heparin gtt at 1000 units/hr Daily heparin level and CBC F/U with transitioning back to PO AC   Lenford Beddow D. Laney Potashang, PharmD, BCPS, BCCCP 12/08/2017, 7:43 AM

## 2017-12-09 LAB — CBC
HCT: 36.4 % (ref 36.0–46.0)
Hemoglobin: 11.7 g/dL — ABNORMAL LOW (ref 12.0–15.0)
MCH: 31.4 pg (ref 26.0–34.0)
MCHC: 32.1 g/dL (ref 30.0–36.0)
MCV: 97.6 fL (ref 78.0–100.0)
Platelets: 218 10*3/uL (ref 150–400)
RBC: 3.73 MIL/uL — AB (ref 3.87–5.11)
RDW: 13.2 % (ref 11.5–15.5)
WBC: 8.3 10*3/uL (ref 4.0–10.5)

## 2017-12-09 LAB — BASIC METABOLIC PANEL
Anion gap: 7 (ref 5–15)
BUN: 15 mg/dL (ref 8–23)
CALCIUM: 9.1 mg/dL (ref 8.9–10.3)
CO2: 26 mmol/L (ref 22–32)
CREATININE: 0.71 mg/dL (ref 0.44–1.00)
Chloride: 107 mmol/L (ref 98–111)
GFR calc non Af Amer: >60 mL/min (ref >60)
GLUCOSE: 120 mg/dL — AB (ref 70–99)
Potassium: 4.2 mmol/L (ref 3.5–5.1)
Sodium: 140 mmol/L (ref 135–145)

## 2017-12-09 LAB — GLUCOSE, CAPILLARY
GLUCOSE-CAPILLARY: 114 mg/dL — AB (ref 70–99)
Glucose-Capillary: 116 mg/dL — ABNORMAL HIGH (ref 70–99)
Glucose-Capillary: 113 mg/dL — ABNORMAL HIGH (ref 70–99)
Glucose-Capillary: 127 mg/dL — ABNORMAL HIGH (ref 70–99)

## 2017-12-09 LAB — HEPARIN LEVEL (UNFRACTIONATED): Heparin Unfractionated: 0.51 IU/mL (ref 0.30–0.70)

## 2017-12-09 MED ORDER — WARFARIN SODIUM 3 MG PO TABS
3.0000 mg | ORAL_TABLET | Freq: Once | ORAL | Status: AC
Start: 2017-12-09 — End: 2017-12-09
  Administered 2017-12-09: 3 mg via ORAL
  Filled 2017-12-09: qty 1

## 2017-12-09 MED ORDER — METOPROLOL TARTRATE 12.5 MG HALF TABLET
12.5000 mg | ORAL_TABLET | Freq: Two times a day (BID) | ORAL | Status: DC
  Administered 2017-12-09 – 2017-12-13 (×9): 12.5 mg via ORAL
  Filled 2017-12-09 (×9): qty 1

## 2017-12-09 MED ORDER — WARFARIN - PHARMACIST DOSING INPATIENT
Freq: Every day | Status: DC
  Administered 2017-12-11: 18:00:00

## 2017-12-09 MED ORDER — ALTEPLASE 2 MG IJ SOLR
2.0000 mg | Freq: Once | INTRAMUSCULAR | Status: AC
  Administered 2017-12-09: 2 mg

## 2017-12-09 NOTE — Progress Notes (Addendum)
PROGRESS NOTE    Lisa Hopkins  UJW:[id number] DOB: [birth year] DOA: 11/29/2017 PCP: Lucky Cowboy, MD   Brief Narrative: 84 y.o.femalewith medical history significant forhypertension, history of diabetes now diet controlled, chronic kidney disease stage II, history of CVA, dementia, and atrial fibrillation on warfarin, now presenting to the emergency department for evaluation of upper abdominal pain, nausea, and nonbloody vomiting. Patient was admitted to the hospital from 11/25/2017 until 11/29/2017 for the same symptoms, had GI and surgical consultations, underwent CT of the abdomen and pelvis and EGD, and her complaints were attributed to large hiatal hernia. She improved with medical management during the hospitalization, was discharged in much improved condition, and was planning to follow-up with surgery as an outpatient for possible surgical treatment. Unfortunately, soon after leaving the hospital, her pain became severe again and she had recurrence and nausea with nonbloody vomiting. She reports the symptoms to be the same as those during the recent hospitalization, denies fevers or chills, and denies chest pain or shortness of breath. Her son is at the bedside and assists with the history.  ED Course:Upon arrival to the ED, patient is found to be afebrile, saturating well on room air, mildly hypertensive, and vitals otherwise normal. Chemistry panel is notable for a potassium of 3.3 and bilirubin 1.6. CBC is notable for a mild polycythemia. Patient was given 500 cc normal saline, IV morphine, and IV Zofran in the ED. She had transient improvement in her symptoms with these measures in the ED, but pain became unbearable and she developed recurrent nausea with vomiting within about an hour.  Admitted with recurrent nausea, vomiting, abdominal pain 24 hours after recent admission. Symptoms are thought to be related to large hiatal hernia. She was evaluated by cardiology who  thought patient was high risk, but ok to proceed with surgery if needed. She was evaluated by surgery, Dr Ezzard Standing wants to give another trial of clears, if patient fail she might need sx.     Assessment & Plan:   Principal Problem:   Intractable abdominal pain Active Problems:   Essential hypertension   Atrial fibrillation (HCC)   History of CVA (cerebrovascular accident)   CKD stage 2 due to type 2 diabetes mellitus (HCC)   1-Persistent Nausea vomiting, abdominal pain, Para esophageal Hiatal hernia.  Patient tolerating full liquid diet so far.  Plan is to avoid surgery as much as possible due to high perioperative cardiac risk. Came back with nausea, vomiting. -Endoscopy 7-3; showed proximal esophageal wed, stomach shows hiatal hernia no ulcer.  -surgery and GI consulted again. Plan for surgery next week. -IV Zofran and phenergan PRN for nausea.  Cardiology consulted for pre op eval. Echo normal EF, mitral and tricuspid regurgitation. Follow cardio rec. Per cardiology if surgery needed, could proceed, patient is optimized cardiac stand point.  Discussed with sx, started TPN for nutrition. TPN reduced to half today. Continue onfull liquid diet and monitor progress.  She will likely stay on full liquid dietlifelonggoing forward.  Coffee ground emesis;  Initially on IV Protonix gtt. Now on protonix IV BID> PRN Zofran for nausea.  IV fluids.  GI notified of readmission and new problem.  KUB negative for obstruction.  If abdominal pain, and vomiting gets worse will need CT abdomen, per GI rec.  HB has remain stable.  No further vomiting.   History of CVA; A fib; will need to be on heparin when INR less than 2.  Now on heparin Gtt patient was on Coumadin at home.  Bridge  heparin and restart Coumadin in preparation for discharge to SNF.  Surgery is not going to be the plan of care at this time.  Patient doing well and tolerating full liquids without any problem.pharmacy  consulted.  CKD stage 2;stable   Chronic A fib;  On metoprolol at home.  HTN; continue with PRN hydralazine. Marland Kitchen Resume cozaar now she has not been vomiting.        DVT prophylaxis:heparin Code Status: dnr Family Communication none  Disposition Plan:dc to snf 7/15 Consultants:  gen surg Procedures:none Antimicrobials:none  Subjective:up in chair awake alert waiting for breakfast no nausea vomitng   Objective: Vitals:   12/08/17 1455 12/08/17 2019 12/08/17 2350 12/09/17 0444  BP: (!) 141/63 (!) 146/78 (!) 154/84 (!) 155/72  Pulse: 69 66 74 72  Resp: 17 16  16   Temp: 97.8 F (36.6 C)   98 F (36.7 C)  TempSrc: Oral   Oral  SpO2: 96% 97%  97%  Weight:      Height:        Intake/Output Summary (Last 24 hours) at 12/09/2017 1148 Last data filed at 12/09/2017 0935 Gross per 24 hour  Intake 780 ml  Output -  Net 780 ml   Filed Weights   12/04/17 0500 12/05/17 0500 12/07/17 0439  Weight: 58.6 kg (129 lb 3 oz) 57.4 kg (126 lb 8.7 oz) 56.9 kg (125 lb 7.1 oz)    Examination:  General exam: Appears calm and comfortable  Respiratory system: Clear to auscultation. Respiratory effort normal. Cardiovascular system: S1 & S2 heard, RRR. No JVD, murmurs, rubs, gallops or clicks. No pedal edema. Gastrointestinal system: Abdomen is nondistended, soft and nontender. No organomegaly or masses felt. Normal bowel sounds heard. Central nervous system: Alert and oriented. No focal neurological deficits. Extremities: Symmetric 5 x 5 power. Skin: No rashes, lesions or ulcers Psychiatry: Judgement and insight appear normal. Mood & affect appropriate.     Data Reviewed: I have personally reviewed following labs and imaging studies  CBC: Recent Labs  Lab 12/03/17 0340 12/04/17 0358 12/05/17 0310 12/06/17 0327 12/08/17 0316 12/09/17 0409  WBC 8.9 7.0 8.5 8.7 11.5* 8.3  NEUTROABS 5.0  --   --   --   --   --   HGB 12.4 12.3 12.9 12.2 11.7* 11.7*  HCT 38.4 38.5 41.3  38.7 36.4 36.4  MCV 96.7 97.2 99.3 98.0 97.3 97.6  PLT 230 229 249 238 222 218   Basic Metabolic Panel: Recent Labs  Lab 12/02/17 1212  12/03/17 0340 12/04/17 0358 12/06/17 0327 12/07/17 0600 12/08/17 0316 12/09/17 0409  NA  --    < > 136 136 138 138 134* 140  K  --    < > 3.7 3.9 3.9 4.1 4.1 4.2  CL  --    < > 107 105 105 104 102 107  CO2  --    < > 25 25 27 26 25 26   GLUCOSE  --    < > 108* 136* 142* 157* 150* 120*  BUN  --    < > 6* 11 17 18 17 15   CREATININE  --    < > 0.64 0.62 0.72 0.64 0.70 0.71  CALCIUM  --    < > 8.2* 8.2* 8.5* 8.7* 9.0 9.1  MG 1.7  --  2.2 1.9 2.0  --   --   --   PHOS 2.1*  --  3.1 3.4 3.1  --   --   --    < > =  values in this interval not displayed.   GFR: Estimated Creatinine Clearance: 42 mL/min (by C-G formula based on SCr of 0.71 mg/dL). Liver Function Tests: Recent Labs  Lab 12/03/17 0340 12/06/17 0327  AST 14* 21  ALT 11 16  ALKPHOS 69 66  BILITOT 1.0 0.6  PROT 4.8* 5.0*  ALBUMIN 2.5* 2.5*   No results for input(s): LIPASE, AMYLASE in the last 168 hours. No results for input(s): AMMONIA in the last 168 hours. Coagulation Profile: Recent Labs  Lab 12/03/17 0340  INR 1.00   Cardiac Enzymes: No results for input(s): CKTOTAL, CKMB, CKMBINDEX, TROPONINI in the last 168 hours. BNP (last 3 results) No results for input(s): PROBNP in the last 8760 hours. HbA1C: No results for input(s): HGBA1C in the last 72 hours. CBG: Recent Labs  Lab 12/08/17 1217 12/08/17 1831 12/08/17 2354 12/09/17 0439 12/09/17 0631  GLUCAP 152* 131* 113* 114* 116*   Lipid Profile: No results for input(s): CHOL, HDL, LDLCALC, TRIG, CHOLHDL, LDLDIRECT in the last 72 hours. Thyroid Function Tests: No results for input(s): TSH, T4TOTAL, FREET4, T3FREE, THYROIDAB in the last 72 hours. Anemia Panel: No results for input(s): VITAMINB12, FOLATE, FERRITIN, TIBC, IRON, RETICCTPCT in the last 72 hours. Sepsis Labs: No results for input(s): PROCALCITON,  LATICACIDVEN in the last 168 hours.  No results found for this or any previous visit (from the past 240 hour(s)).       Radiology Studies: No results found.      Scheduled Meds: . cholecalciferol  2,000 Units Oral Daily  . feeding supplement (ENSURE ENLIVE)  237 mL Oral BID BM  .  HYDROmorphone (DILAUDID) injection  0.5 mg Intravenous Once  . insulin aspart  0-9 Units Subcutaneous Q6H  . losartan  50 mg Oral Daily  . mouth rinse  15 mL Mouth Rinse BID  . metoprolol tartrate  5 mg Intravenous Q4H  . pantoprazole  40 mg Oral BID  . sodium chloride flush  10-40 mL Intracatheter Q12H   Continuous Infusions: . heparin 950 Units/hr (12/09/17 1010)     LOS: 9 days     Alwyn Ren, MD [facility]  If 7PM-7AM, please contact night-coverage [email or web address] Password [facility] 12/09/2017, 11:48 AM

## 2017-12-09 NOTE — Progress Notes (Signed)
Pt up in chair for supper.  [facility] when returns to bed so PICC dressing can be changed.

## 2017-12-09 NOTE — Progress Notes (Addendum)
ANTICOAGULATION CONSULT NOTE - Follow Up Consult  Pharmacy Consult:  Heparin Indication: atrial fibrillation and pulmonary embolus  Allergies  Allergen Reactions  . Ace Inhibitors Other (See Comments)    Cough  . Fosamax [Alendronate Sodium] Other (See Comments)    Heart burn  . Norvasc [Amlodipine Besylate] Other (See Comments)    edema  . Zocor [Simvastatin] Other (See Comments)    Elevated CPK    Patient Measurements: Height: 5\' 5"  (165.1 cm) Weight: 125 lb 7.1 oz (56.9 kg) IBW/kg (Calculated) : 57  Vital Signs: Temp: 98 F (36.7 C) (07/14 0444) Temp Source: Oral (07/14 0444) BP: 155/72 (07/14 0444) Pulse Rate: 72 (07/14 0444)  Labs: Recent Labs    12/07/17 0047 12/07/17 0600 12/08/17 0316 12/09/17 0409  HGB  --   --  11.7* 11.7*  HCT  --   --  36.4 36.4  PLT  --   --  222 218  HEPARINUNFRC 0.52  --  0.39 0.51  CREATININE  --  0.64 0.70 0.71    Estimated Creatinine Clearance: 42 mL/min (by C-G formula based on SCr of 0.71 mg/dL).   Assessment: [age] YOF with history of afib on warfarin PTA - currently on hold as patient with N/V and recent coffee ground emesis with no bleeding noted. GI ok with heparin.  Heparin level slightly above low goal; no bleeding reported.   Goal of Therapy:  Heparin level 0.3-0.5 units/ml Monitor platelets by anticoagulation protocol: Yes    Plan:  Reduce heparin gtt slightly to 950 units/hr Daily heparin level and CBC F/U with resuming Coumadin   Nanda Bittick D. Laney Potashang, PharmD, BCPS, BCCCP 12/09/2017, 9:36 AM   ======================  Addendum: Resume Coumadin today INR sub-therapeutic as expected Home Coumadin dose: 2mg  PO daily   Plan: Coumadin 3mg  PO today Daily PT / INR   Donnamaria Shands D. Laney Potashang, PharmD, BCPS, BCCCP 12/09/2017, 12:40 PM

## 2017-12-09 NOTE — Progress Notes (Signed)
Assessment & Plan:  DM HTN CKD-II Afib - on coumadin H/o CVA Code status DNR  Large hiatal hernia - EGD7/3showed web without significant obstruction / PEH without obstruction or ischemia -readmitted 7/4 with persistent n/v - per cardiology patient is high risk forperioperative cardiac complications and is at a low functional capacity  FEN -FLD, Boost VTE -SCDs,heparin drip,holdcoumadin  Plan:             Patient currently stable and tolerating full liquid diet             Operative intervention not anticipated at this point             Discharge planning per family, case manager, and social work             Outpatient follow up with Dr. Ovidio Kin at [facility] office to be arranged  Surgical service will sign off - call if needed          Velora Heckler, MD, [facility]       Office: [phone]   Chief Complaint: Giant hiatal hernia, symptomatic  Subjective: Patient up in chair, comfortable, eating liquid breakfast.  Denies nausea, emesis, or pain.  Objective: Vital signs in last 24 hours: Temp:  [97.8 F (36.6 C)-98 F (36.7 C)] 98 F (36.7 C) (07/14 0444) Pulse Rate:  [64-74] 72 (07/14 0444) Resp:  [16-17] 16 (07/14 0444) BP: (133-155)/(63-84) 155/72 (07/14 0444) SpO2:  [96 %-97 %] 97 % (07/14 0444) Last BM Date: 12/08/17  Intake/Output from previous day: 07/13 0701 - 07/14 0700 In: 780 [P.O.:520; I.V.:260] Out: -  Intake/Output this shift: No intake/output data recorded.  Physical Exam: HEENT - sclerae clear, mucous membranes moist Neck - soft Chest - clear bilaterally Cor - RRR Abdomen - soft, protuberant; non-tender; BS present  Lab Results:  Recent Labs    12/08/17 0316 12/09/17 0409  WBC 11.5* 8.3  HGB 11.7* 11.7*  HCT 36.4 36.4  PLT 222 218   BMET Recent Labs    12/08/17 0316 12/09/17 0409  NA 134* 140  K 4.1 4.2  CL 102 107  CO2 25 26  GLUCOSE  150* 120*  BUN 17 15  CREATININE 0.70 0.71  CALCIUM 9.0 9.1   PT/INR No results for input(s): LABPROT, INR in the last 72 hours. Comprehensive Metabolic Panel:    Component Value Date/Time   NA 140 12/09/2017 0409   NA 134 (L) 12/08/2017 0316   K 4.2 12/09/2017 0409   K 4.1 12/08/2017 0316   CL 107 12/09/2017 0409   CL 102 12/08/2017 0316   CO2 26 12/09/2017 0409   CO2 25 12/08/2017 0316   BUN 15 12/09/2017 0409   BUN 17 12/08/2017 0316   CREATININE 0.71 12/09/2017 0409   CREATININE 0.70 12/08/2017 0316   CREATININE 1.03 (H) 11/08/2017 1100   CREATININE 1.00 (H) 08/03/2017 1113   GLUCOSE 120 (H) 12/09/2017 0409   GLUCOSE 150 (H) 12/08/2017 0316   CALCIUM 9.1 12/09/2017 0409   CALCIUM 9.0 12/08/2017 0316   AST 21 12/06/2017 0327   AST 14 (L) 12/03/2017 0340   ALT 16 12/06/2017 0327   ALT 11 12/03/2017 0340   ALKPHOS 66 12/06/2017 0327   ALKPHOS 69 12/03/2017 0340   BILITOT 0.6 12/06/2017 0327   BILITOT 1.0 12/03/2017 0340   PROT 5.0 (L) 12/06/2017 0327   PROT 4.8 (L) 12/03/2017 0340   ALBUMIN 2.5 (L)  12/06/2017 0327   ALBUMIN 2.5 (L) 12/03/2017 0340    Studies/Results: No results found.    Lisa Hopkins M 12/09/2017  Patient ID: Lisa Hopkins, female   DOB: Jul 12, 1924, 84 y.o.   MRN: [id number]

## 2017-12-09 NOTE — Discharge Instructions (Signed)

## 2017-12-10 DIAGNOSIS — K449 Diaphragmatic hernia without obstruction or gangrene: Principal | ICD-10-CM

## 2017-12-10 DIAGNOSIS — R112 Nausea with vomiting, unspecified: Secondary | ICD-10-CM

## 2017-12-10 LAB — CBC
HCT: 37.4 % (ref 36.0–46.0)
Hemoglobin: 12 g/dL (ref 12.0–15.0)
MCH: 30.8 pg (ref 26.0–34.0)
MCHC: 32.1 g/dL (ref 30.0–36.0)
MCV: 95.9 fL (ref 78.0–100.0)
PLATELETS: 196 10*3/uL (ref 150–400)
RBC: 3.9 MIL/uL (ref 3.87–5.11)
RDW: 13.1 % (ref 11.5–15.5)
WBC: 7.4 10*3/uL (ref 4.0–10.5)

## 2017-12-10 LAB — GLUCOSE, CAPILLARY
GLUCOSE-CAPILLARY: 103 mg/dL — AB (ref 70–99)
GLUCOSE-CAPILLARY: 107 mg/dL — AB (ref 70–99)
Glucose-Capillary: 120 mg/dL — ABNORMAL HIGH (ref 70–99)
Glucose-Capillary: 150 mg/dL — ABNORMAL HIGH (ref 70–99)

## 2017-12-10 LAB — BASIC METABOLIC PANEL
ANION GAP: 9 (ref 5–15)
BUN: 14 mg/dL (ref 8–23)
CALCIUM: 9.2 mg/dL (ref 8.9–10.3)
CO2: 26 mmol/L (ref 22–32)
Chloride: 104 mmol/L (ref 98–111)
Creatinine, Ser: 0.73 mg/dL (ref 0.44–1.00)
GFR calc Af Amer: >60 mL/min (ref >60)
GFR calc non Af Amer: >60 mL/min (ref >60)
Glucose, Bld: 124 mg/dL — ABNORMAL HIGH (ref 70–99)
Potassium: 4.1 mmol/L (ref 3.5–5.1)
Sodium: 139 mmol/L (ref 135–145)

## 2017-12-10 LAB — PROTIME-INR
INR: 1.04
PROTHROMBIN TIME: 13.5 s (ref 11.4–15.2)

## 2017-12-10 LAB — HEPARIN LEVEL (UNFRACTIONATED)
Heparin Unfractionated: 0.57 IU/mL (ref 0.30–0.70)
Heparin Unfractionated: 0.43 IU/mL (ref 0.30–0.70)

## 2017-12-10 MED ORDER — CHOLESTYRAMINE 4 G PO PACK
4.0000 g | PACK | Freq: Two times a day (BID) | ORAL | Status: DC
  Administered 2017-12-10 – 2017-12-13 (×6): 4 g via ORAL
  Filled 2017-12-10 (×6): qty 1

## 2017-12-10 MED ORDER — WARFARIN SODIUM 2.5 MG PO TABS
2.5000 mg | ORAL_TABLET | Freq: Once | ORAL | Status: AC
  Administered 2017-12-10: 2.5 mg via ORAL
  Filled 2017-12-10: qty 1

## 2017-12-10 NOTE — Progress Notes (Signed)
ANTICOAGULATION CONSULT NOTE - Follow Up Consult  Pharmacy Consult:  Heparin Indication: atrial fibrillation and pulmonary embolus  Allergies  Allergen Reactions  . Ace Inhibitors Other (See Comments)    Cough  . Fosamax [Alendronate Sodium] Other (See Comments)    Heart burn  . Norvasc [Amlodipine Besylate] Other (See Comments)    edema  . Zocor [Simvastatin] Other (See Comments)    Elevated CPK    Patient Measurements: Height: 5\' 5"  (165.1 cm) Weight: 125 lb 7.1 oz (56.9 kg) IBW/kg (Calculated) : 57  Vital Signs: Temp: 97.9 F (36.6 C) (07/15 1505) Temp Source: Oral (07/15 1505) BP: 154/71 (07/15 1505) Pulse Rate: 62 (07/15 1505)  Labs: Recent Labs    12/08/17 0316 12/09/17 0409 12/10/17 0406 12/10/17 2040  HGB 11.7* 11.7* 12.0  --   HCT 36.4 36.4 37.4  --   PLT 222 218 196  --   LABPROT  --   --  13.5  --   INR  --   --  1.04  --   HEPARINUNFRC 0.39 0.51 0.57 0.43  CREATININE 0.70 0.71 0.73  --     Estimated Creatinine Clearance: 42 mL/min (by C-G formula based on SCr of 0.73 mg/dL).   Assessment: [age] YOF with history of afib on warfarin PTA - currently on hold as patient with N/V and recent coffee ground emesis with no bleeding noted. GI ok with heparin, warfarin resumed 7/14. PTA warfarin dose 2 mg daily EXCEPT for 1 mg on TTS.  Heparin level now therapeutic  Goal of Therapy:  Heparin level 0.3-0.5 units/ml Monitor platelets by anticoagulation protocol: Yes  Plan:  - Continue Heparin to 900 units/hr (9 ml/hr) - follow up with heparin level and PT/INR in the a.m.   Thanks for allowing pharmacy to be a part of this patient's care.  Talbert CageLora Nealy Hickmon, PharmD Clinical Pharmacist

## 2017-12-10 NOTE — Progress Notes (Addendum)
PROGRESS NOTE    Lisa Hopkins  ZOX:[id number] DOB: 06-Aug-1924 DOA: 11/29/2017 PCP: Lucky Cowboy, MD   Brief Narrative: 84 y.o.femalewith medical history significant forhypertension, history of diabetes now diet controlled, chronic kidney disease stage II, history of CVA, dementia, and atrial fibrillation on warfarin, now presenting to the emergency department for evaluation of upper abdominal pain, nausea, and nonbloody vomiting. Patient was admitted to the hospital from 11/25/2017 until 11/29/2017 for the same symptoms, had GI and surgical consultations, underwent CT of the abdomen and pelvis and EGD, and her complaints were attributed to large hiatal hernia. She improved with medical management during the hospitalization, was discharged in much improved condition, and was planning to follow-up with surgery as an outpatient for possible surgical treatment. Unfortunately, soon after leaving the hospital, her pain became severe again and she had recurrence and nausea with nonbloody vomiting. She reports the symptoms to be the same as those during the recent hospitalization, denies fevers or chills, and denies chest pain or shortness of breath. Her son is at the bedside and assists with the history.  ED Course:Upon arrival to the ED, patient is found to be afebrile, saturating well on room air, mildly hypertensive, and vitals otherwise normal. Chemistry panel is notable for a potassium of 3.3 and bilirubin 1.6. CBC is notable for a mild polycythemia. Patient was given 500 cc normal saline, IV morphine, and IV Zofran in the ED. She had transient improvement in her symptoms with these measures in the ED, but pain became unbearable and she developed recurrent nausea with vomiting within about an hour.  Admitted with recurrent nausea, vomiting, abdominal pain 24 hours after recent admission. Symptoms are thought to be related to large hiatal hernia. She was evaluated by cardiology who  thought patient was high risk, but ok to proceed with surgery if needed. She was evaluated by surgery, Dr Ezzard Standing wants to give another trial of clears, if patient fail she might need sx.   Assessment & Plan:   Principal Problem:   Intractable abdominal pain Active Problems:   Essential hypertension   Atrial fibrillation (HCC)   History of CVA (cerebrovascular accident)   CKD stage 2 due to type 2 diabetes mellitus (HCC)   # Refractory N/V/ abd pain/ large hiatal (paraesophageal) hernia - Endoscopy 7-3; showed proximal esophageal wed, stomach shows hiatal hernia - admitted 6/30 > 7/4 for w/u then dc'd home then readmitted 7/5 with nausea, vomiting - surgery and GI consulted again - seen by cardiology pre-op risk assessment - Per gen surgery pt is not a good candidate for hiatal hernia repair. Pt has a high perioperative cardiac risk. Plan is to wean off of TNA and continue full liquid diet possible permanently. - Patient tolerating full liquid diet so far.   - Gen surg signed off today - pt will f/u w/ Dr Ovidio Kin at [facility] office - will be ready for dc once INR is in therapeutic range - per PT recommendation is for dc to [facility] independent living   # Coffee ground emesis: improved   # Diarrhea - will ask dietician to help her get a better diet , per surg should try to keep her on full liquid/ pureed type foods (soups, mashed potatoes, etc).    # Hx chronic afib/ hx CVA: on coumadin at home - gen surg signed off, coumadin restarted yesterday and will continue until INR in therapeutic range then will dc home.   CKD stage 2;stable   HTN; continue with PRN  hydralazine. Back on cozaar, also metoprolol, BP control is good.    Vinson Moselle MD [facility] pgr [phone] 10/21/2017, 9:25 AM    DVT prophylaxis:heparin Code Status: dnr Family Communication: at bedside Disposition Plan: dc to [facility] when medically ready for dc Consultants:  gen  surg Procedures: none Antimicrobials: none  Subjective:  up in bed, no c/o   Objective: Vitals:   12/09/17 1415 12/09/17 2035 12/10/17 0539 12/10/17 1505  BP: 140/79 (!) 144/87 (!) 155/80 (!) 154/71  Pulse:  73 75 62  Resp:  17 16 20   Temp:  98.2 F (36.8 C) 97.8 F (36.6 C) 97.9 F (36.6 C)  TempSrc:  Oral Oral Oral  SpO2:  96% 96% 97%  Weight:      Height:        Intake/Output Summary (Last 24 hours) at 12/10/2017 1507 Last data filed at 12/10/2017 1000 Gross per 24 hour  Intake 410 ml  Output 900 ml  Net -490 ml   Filed Weights   12/04/17 0500 12/05/17 0500 12/07/17 0439  Weight: 58.6 kg (129 lb 3 oz) 57.4 kg (126 lb 8.7 oz) 56.9 kg (125 lb 7.1 oz)    Examination:  General exam: Appears calm and comfortable  Respiratory system: Clear to auscultation. Respiratory effort normal. Cardiovascular system: S1 & S2 heard, RRR. No JVD, murmurs, rubs, gallops or clicks. No pedal edema. Gastrointestinal system: Abdomen is nondistended, soft and nontender. No organomegaly or masses felt. Normal bowel sounds heard. Central nervous system: Alert and oriented. No focal neurological deficits. Extremities: Symmetric 5 x 5 power. Skin: No rashes, lesions or ulcers Psychiatry: Judgement and insight appear normal. Mood & affect appropriate.     Data Reviewed: I have personally reviewed following labs and imaging studies  CBC: Recent Labs  Lab 12/05/17 0310 12/06/17 0327 12/08/17 0316 12/09/17 0409 12/10/17 0406  WBC 8.5 8.7 11.5* 8.3 7.4  HGB 12.9 12.2 11.7* 11.7* 12.0  HCT 41.3 38.7 36.4 36.4 37.4  MCV 99.3 98.0 97.3 97.6 95.9  PLT 249 238 222 218 196   Basic Metabolic Panel: Recent Labs  Lab 12/04/17 0358 12/06/17 0327 12/07/17 0600 12/08/17 0316 12/09/17 0409 12/10/17 0406  NA 136 138 138 134* 140 139  K 3.9 3.9 4.1 4.1 4.2 4.1  CL 105 105 104 102 107 104  CO2 25 27 26 25 26 26   GLUCOSE 136* 142* 157* 150* 120* 124*  BUN 11 17 18 17 15 14   CREATININE  0.62 0.72 0.64 0.70 0.71 0.73  CALCIUM 8.2* 8.5* 8.7* 9.0 9.1 9.2  MG 1.9 2.0  --   --   --   --   PHOS 3.4 3.1  --   --   --   --    GFR: Estimated Creatinine Clearance: 42 mL/min (by C-G formula based on SCr of 0.73 mg/dL). Liver Function Tests: Recent Labs  Lab 12/06/17 0327  AST 21  ALT 16  ALKPHOS 66  BILITOT 0.6  PROT 5.0*  ALBUMIN 2.5*   No results for input(s): LIPASE, AMYLASE in the last 168 hours. No results for input(s): AMMONIA in the last 168 hours. Coagulation Profile: Recent Labs  Lab 12/10/17 0406  INR 1.04   Cardiac Enzymes: No results for input(s): CKTOTAL, CKMB, CKMBINDEX, TROPONINI in the last 168 hours. BNP (last 3 results) No results for input(s): PROBNP in the last 8760 hours. HbA1C: No results for input(s): HGBA1C in the last 72 hours. CBG: Recent Labs  Lab 12/09/17 1234  12/09/17 1753 12/10/17 0059 12/10/17 0636 12/10/17 1209  GLUCAP 113* 127* 107* 103* 120*   Lipid Profile: No results for input(s): CHOL, HDL, LDLCALC, TRIG, CHOLHDL, LDLDIRECT in the last 72 hours. Thyroid Function Tests: No results for input(s): TSH, T4TOTAL, FREET4, T3FREE, THYROIDAB in the last 72 hours. Anemia Panel: No results for input(s): VITAMINB12, FOLATE, FERRITIN, TIBC, IRON, RETICCTPCT in the last 72 hours. Sepsis Labs: No results for input(s): PROCALCITON, LATICACIDVEN in the last 168 hours.  No results found for this or any previous visit (from the past 240 hour(s)).       Radiology Studies: No results found.      Scheduled Meds: . cholecalciferol  2,000 Units Oral Daily  . feeding supplement (ENSURE ENLIVE)  237 mL Oral BID BM  .  HYDROmorphone (DILAUDID) injection  0.5 mg Intravenous Once  . insulin aspart  0-9 Units Subcutaneous Q6H  . losartan  50 mg Oral Daily  . mouth rinse  15 mL Mouth Rinse BID  . metoprolol tartrate  12.5 mg Oral BID  . pantoprazole  40 mg Oral BID  . sodium chloride flush  10-40 mL Intracatheter Q12H  . warfarin   2.5 mg Oral ONCE-1800  . Warfarin - Pharmacist Dosing Inpatient   Does not apply q1800   Continuous Infusions: . heparin 900 Units/hr (12/10/17 1012)     LOS: 10 days    If 7PM-7AM, please contact night-coverage [email or web address] Password [facility] 12/10/2017, 3:07 PM

## 2017-12-10 NOTE — Progress Notes (Signed)
Physical Therapy Treatment Patient Details Name: Lisa Hopkins MRN: [id number] DOB: 24-Jan-1925 Today's Date: 12/10/2017    History of Present Illness Pt is a 84 y/o female with a PMH significant for HTN, history of diabetes now diet controlled, CKDII, CVA, dementia, and a-fib on warfarin, now presenting to the emergency department for evaluation of upper abdominal pain, nausea, and nonbloody vomiting.  Patient was admitted to the hospital from 11/25/2017 until 11/29/2017 for the same symptoms, underwent CT of the abdomen/pelvis and EGD, and she was found to have a large hiatal hernia. She was discharged in much improved condition, and was planning to follow-up with surgery as an outpatient for possible surgical treatment.  Unfortunately, soon after leaving the hospital, her pain became severe again and she had recurrence and nausea with nonbloody vomiting.     PT Comments    Pt progressing towards physical therapy goals. Was able to ambulate in hall with single extremity support on IV pole only - will plan to attempt with [facility] next session. Pt reports feeling better in the mornings, so will attempt to schedule further therapy sessions in the mornings as well. Will continue to follow and progress as able per POC.    Follow Up Recommendations  Home health PT(Independent living at [facility])     Equipment Recommendations  Rolling walker with 5" wheels    Recommendations for Other Services       Precautions / Restrictions Precautions Precautions: Fall Restrictions Weight Bearing Restrictions: No    Mobility  Bed Mobility Overal bed mobility: Needs Assistance Bed Mobility: Supine to Sit Rolling: Modified independent (Device/Increase time) Sidelying to sit: Modified independent (Device/Increase time)       General bed mobility comments: HOB slightly elevated. Pt was able to transition to EOB with min use of rail. No significant effort noted to complete transfer.    Transfers Overall transfer level: Needs assistance Equipment used: 1 person hand held assist Transfers: Sit to/from Stand Sit to Stand: Min guard         General transfer comment: Hands-on guarding for safety. Pt powered up to full stand without assist, and demonstrated proper hand placement on seated surface for safety.   Ambulation/Gait Ambulation/Gait assistance: Min guard Gait Distance (Feet): 250 Feet Assistive device: IV Pole Gait Pattern/deviations: Step-through pattern;Decreased stride length;Trunk flexed;Narrow base of support Gait velocity: Decreased Gait velocity interpretation: 1.31 - 2.62 ft/sec, indicative of limited community ambulator General Gait Details: Somewhat slowed pace but improved from prior session. Overall steady with single extremity support on IV pole for support. Hands on guarding provided for safety, however no assist required. Pt was able to ambulate to the window at end of hall - sat on bench for a rest break, and then ambulated back.    Stairs             Wheelchair Mobility    Modified Rankin (Stroke Patients Only)       Balance Overall balance assessment: Needs assistance Sitting-balance support: Feet supported;No upper extremity supported Sitting balance-Leahy Scale: Good     Standing balance support: Single extremity supported;During functional activity Standing balance-Leahy Scale: Fair                              Cognition Arousal/Alertness: Awake/alert Behavior During Therapy: WFL for tasks assessed/performed Overall Cognitive Status: History of cognitive impairments - at baseline  General Comments: Baseline dementia per chart review      Exercises General Exercises - Lower Extremity Ankle Circles/Pumps: 10 reps Long Arc Quad: 10 reps Heel Slides: 10 reps    General Comments        Pertinent Vitals/Pain Pain Assessment: No/denies pain    Home  Living                      Prior Function            PT Goals (current goals can now be found in the care plan section) Acute Rehab PT Goals Patient Stated Goal: to go home PT Goal Formulation: With patient/family Time For Goal Achievement: 12/17/17 Potential to Achieve Goals: Good Progress towards PT goals: Progressing toward goals    Frequency    Min 3X/week      PT Plan Current plan remains appropriate    Co-evaluation              AM-PAC PT "6 Clicks" Daily Activity  Outcome Measure  Difficulty turning over in bed (including adjusting bedclothes, sheets and blankets)?: A Little Difficulty moving from lying on back to sitting on the side of the bed? : A Little Difficulty sitting down on and standing up from a chair with arms (e.g., wheelchair, bedside commode, etc,.)?: A Little Help needed moving to and from a bed to chair (including a wheelchair)?: A Little Help needed walking in hospital room?: A Little Help needed climbing 3-5 steps with a railing? : A Little 6 Click Score: 18    End of Session Equipment Utilized During Treatment: Gait belt Activity Tolerance: Patient tolerated treatment well Patient left: in chair;with call bell/phone within reach;with chair alarm set Nurse Communication: Mobility status;Precautions PT Visit Diagnosis: Unsteadiness on feet (R26.81);Other abnormalities of gait and mobility (R26.89)     Time: 0865-7846 PT Time Calculation (min) (ACUTE ONLY): 17 min  Charges:  $Gait Training: 8-22 mins                    G Codes:       Conni Slipper, PT, DPT Acute Rehabilitation Services Pager: [phone]    Marylynn Pearson 12/10/2017, 8:33 AM

## 2017-12-10 NOTE — Care Management Note (Signed)
Case Management Note  Patient Details  Name: Pati GalloFrances H Mathison MRN: [id number] Date of Birth: 20-Dec-1924  Subjective/Objective:                    Action/Plan: [facility] from [facility]. PT recommendations Home Health PT. Patient ambulated 250 feet. SW BigelowAshley and NCM spoke with patient and son Rosanne AshingJim at bedside.  Rosanne AshingJim would like his mother to have 24 hour assistance at [facility]. Morrie Sheldonshley spoke with Chales AbrahamsMary Ann at same and explained options however family would have to pay out of pocket . Rosanne AshingJim will call Chales AbrahamsMary Ann directly , however feels this would be too expensive. NCM suggested friends / family members take turns staying with patient if he feels it necessary . [facility] preferred provider for home health is [facility].  Explained same to VidaliaJim. Rosanne AshingJim will discuss options with brother Freida Busmanllen.  Expected Discharge Date:                  Expected Discharge Plan:  Home w Home Health Services  In-House Referral:  Clinical Social Work  Discharge planning Services  CM Consult  Post Acute Care Choice:    Choice offered to:  Patient, Adult Children  DME Arranged:  N/A DME Agency:  NA  HH Arranged:  PT HH Agency:     Status of Service:  In process, will continue to follow  If discussed at Long Length of Stay Meetings, dates discussed:    Additional Comments:  Kingsley PlanWile, Chinenye Katzenberger Marie, RN 12/10/2017, 12:39 PM

## 2017-12-10 NOTE — Progress Notes (Signed)
ANTICOAGULATION CONSULT NOTE - Follow Up Consult  Pharmacy Consult:  Heparin Indication: atrial fibrillation and pulmonary embolus  Allergies  Allergen Reactions  . Ace Inhibitors Other (See Comments)    Cough  . Fosamax [Alendronate Sodium] Other (See Comments)    Heart burn  . Norvasc [Amlodipine Besylate] Other (See Comments)    edema  . Zocor [Simvastatin] Other (See Comments)    Elevated CPK    Patient Measurements: Height: 5\' 5"  (165.1 cm) Weight: 125 lb 7.1 oz (56.9 kg) IBW/kg (Calculated) : 57  Vital Signs: Temp: 97.8 F (36.6 C) (07/15 0539) Temp Source: Oral (07/15 0539) BP: 155/80 (07/15 0539) Pulse Rate: 75 (07/15 0539)  Labs: Recent Labs    12/08/17 0316 12/09/17 0409 12/10/17 0406  HGB 11.7* 11.7* 12.0  HCT 36.4 36.4 37.4  PLT 222 218 196  LABPROT  --   --  13.5  INR  --   --  1.04  HEPARINUNFRC 0.39 0.51 0.57  CREATININE 0.70 0.71 0.73    Estimated Creatinine Clearance: 42 mL/min (by C-G formula based on SCr of 0.73 mg/dL).   Assessment: [age] YOF with history of afib on warfarin PTA - currently on hold as patient with N/V and recent coffee ground emesis with no bleeding noted. GI ok with heparin, warfarin resumed 7/14. PTA warfarin dose 2 mg daily EXCEPT for 1 mg on TTS.  Heparin level slightly above low goal (HL 0.57 << 0.51, goal of 0.3-0.5). INR remains SUBtherapeutic (INR 1.04, goal of 2-3). CBC wnl, no bleeding reported.  Goal of Therapy:  Heparin level 0.3-0.5 units/ml Monitor platelets by anticoagulation protocol: Yes  Plan:  - Reduce Heparin to 900 units/hr (9 ml/hr) - Warfarin 2.5 mg x 1 dose at 1800 today - Will continue to monitor for any signs/symptoms of bleeding and will follow up with heparin level in 8 hours and PT/INR in the a.m.   Thank you for allowing pharmacy to be a part of this patient's care.  Georgina PillionElizabeth Cheryel Kyte, PharmD, BCPS Clinical Pharmacist Pager: [phone] Clinical phone for 12/10/2017 from 7a-3:30p: [phone] If  after 3:30p, please call main pharmacy at: [phone] 12/10/2017 9:43 AM

## 2017-12-10 NOTE — Progress Notes (Signed)
Occupational Therapy Evaluation Patient Details Name: Lisa Hopkins MRN: [id number] DOB: 04-03-1925 Today's Date: 12/10/2017    History of Present Illness Pt is a 84 y/o female with a PMH significant for HTN, history of diabetes now diet controlled, CKDII, CVA, dementia, and a-fib on warfarin, now presenting to the emergency department for evaluation of upper abdominal pain, nausea, and nonbloody vomiting.  Patient was admitted to the hospital from 11/25/2017 until 11/29/2017 for the same symptoms, underwent CT of the abdomen/pelvis and EGD, and she was found to have a large hiatal hernia. She was discharged in much improved condition, and was planning to follow-up with surgery as an outpatient for possible surgical treatment.  Unfortunately, soon after leaving the hospital, her pain became severe again and she had recurrence and nausea with nonbloody vomiting.    Clinical Impression   Patient progressing well.  Demonstrates ability to complete toilet transfers, grooming and LB dressing with supervision using rolling walker.  Intermittent cueing for safety during mobility and transfers, for hand placement and pacing.  Son reports cognition at baseline, but reports noted decline in memory when she is "not feeling well".  He reports she completes mobility to dining room for dinner, otherwise completing simple meal prep, limited cleaning, and family supports by preparing pill box. At this time, continue to recommend 24/7 assistance/supervision at discharge for safety but progress towards intermittently as she transitions back to PLOF.  Son requested to speak to social work, and therapist relayed message. Will continue to follow acutely.     Follow Up Recommendations  Home health OT;Supervision/Assistance - 24 hour(at [facility], inital 24/7 supervision but fade as appropriate)    Equipment Recommendations       Recommendations for Other Services       Precautions / Restrictions  Precautions Precautions: Fall Restrictions Weight Bearing Restrictions: No      Mobility Bed Mobility Overal bed mobility: Needs Assistance Bed Mobility: Supine to Sit Rolling: Modified independent (Device/Increase time) Sidelying to sit: Modified independent (Device/Increase time)       General bed mobility comments: seated in recliner upon therapist entry  Transfers Overall transfer level: Needs assistance Equipment used: Rolling walker (2 wheeled) Transfers: Sit to/from Stand Sit to Stand: Supervision         General transfer comment: close supervision  and vcs for safety, no physical assistance required     Balance Overall balance assessment: Needs assistance Sitting-balance support: Feet supported;No upper extremity supported Sitting balance-Leahy Scale: Good     Standing balance support: No upper extremity supported;During functional activity Standing balance-Leahy Scale: Fair Standing balance comment: standing during UE ADL tasks with supervision within base of support                           ADL either performed or assessed with clinical judgement   ADL Overall ADL's : Needs assistance/impaired     Grooming: Supervision/safety;Standing;Wash/dry hands               Lower Body Dressing: Supervision/safety;Sit to/from stand   Toilet Transfer: Supervision/safety;Ambulation;RW;Comfort height toilet Toilet Transfer Details (indicate cue type and reason): cueing for safety and pacing  Toileting- Clothing Manipulation and Hygiene: Supervision/safety;Sit to/from stand       Functional mobility during ADLs: Supervision/safety;Rolling walker;Cueing for safety General ADL Comments: cueing for safety and pacing throughout session, used RW      Vision         Perception     Praxis  Pertinent Vitals/Pain Pain Assessment: No/denies pain     Hand Dominance     Extremity/Trunk Assessment             Communication      Cognition Arousal/Alertness: Awake/alert Behavior During Therapy: WFL for tasks assessed/performed Overall Cognitive Status: History of cognitive impairments - at baseline                                 General Comments: Baseline dementia per chart review   General Comments       Exercises Exercises: General Lower Extremity General Exercises - Lower Extremity Ankle Circles/Pumps: 10 reps Long Arc Quad: 10 reps Heel Slides: 10 reps   Shoulder Instructions      Home Living                                          Prior Functioning/Environment                   OT Problem List:        OT Treatment/Interventions:      OT Goals(Current goals can be found in the care plan section) Acute Rehab OT Goals Patient Stated Goal: to go home OT Goal Formulation: With patient Time For Goal Achievement: 12/20/17 Potential to Achieve Goals: Good  OT Frequency: Min 2X/week   Barriers to D/C:            Co-evaluation              AM-PAC PT "6 Clicks" Daily Activity     Outcome Measure Help from another person eating meals?: None Help from another person taking care of personal grooming?: A Little Help from another person toileting, which includes using toliet, bedpan, or urinal?: A Little Help from another person bathing (including washing, rinsing, drying)?: A Little Help from another person to put on and taking off regular upper body clothing?: None Help from another person to put on and taking off regular lower body clothing?: A Little 6 Click Score: 20   End of Session Equipment Utilized During Treatment: Rolling walker  Activity Tolerance: Patient tolerated treatment well Patient left: in chair;with call bell/phone within reach;with family/visitor present;with chair alarm set  OT Visit Diagnosis: Unsteadiness on feet (R26.81)                Time: 1610-9604 OT Time Calculation (min): 18 min Charges:  OT General Charges $OT  Visit: 1 Visit OT Treatments $Self Care/Home Management : 8-22 mins G-Codes:     Chancy Milroy, OTR/L  Pager [phone]   Chancy Milroy 12/10/2017, 9:46 AM

## 2017-12-10 NOTE — Plan of Care (Signed)
  Problem: Health Behavior/Discharge Planning: Goal: Ability to manage health-related needs will improve Outcome: Progressing   Problem: Clinical Measurements: Goal: Ability to maintain clinical measurements within normal limits will improve Outcome: Progressing Goal: Will remain free from infection Outcome: Progressing Goal: Diagnostic test results will improve Outcome: Progressing Goal: Cardiovascular complication will be avoided Outcome: Progressing   Problem: Activity: Goal: Risk for activity intolerance will decrease Outcome: Progressing   

## 2017-12-10 NOTE — Progress Notes (Signed)
Clinical Social Worker spoke with patient and son at bedside, RNCM on floor was present during conversation. CSW spoke with son about his options for 24 hour care at patients independent living facility ([facility] CSW stated that according to the facility if family wants 24 hour care for patient they will have to pay out of pocket for the services. Son stated he is agreeable with patient to go home with home health and he will reach out to facility about getting more information on 24 hour care. CSW signing off as social work intervention is no longer needed.  Marrianne MoodAshley Derec Mozingo, MSW,  [employer]WA [phone]

## 2017-12-10 NOTE — Progress Notes (Signed)
ANTICOAGULATION CONSULT NOTE - Follow Up Consult  Pharmacy Consult:  Heparin Indication: atrial fibrillation and pulmonary embolus  Allergies  Allergen Reactions  . Ace Inhibitors Other (See Comments)    Cough  . Fosamax [Alendronate Sodium] Other (See Comments)    Heart burn  . Norvasc [Amlodipine Besylate] Other (See Comments)    edema  . Zocor [Simvastatin] Other (See Comments)    Elevated CPK    Patient Measurements: Height: 5\' 5"  (165.1 cm) Weight: 125 lb 7.1 oz (56.9 kg) IBW/kg (Calculated) : 57  Vital Signs: Temp: 97.9 F (36.6 C) (07/15 1505) Temp Source: Oral (07/15 1505) BP: 154/71 (07/15 1505) Pulse Rate: 62 (07/15 1505)  Labs: Recent Labs    12/08/17 0316 12/09/17 0409 12/10/17 0406 12/10/17 2040  HGB 11.7* 11.7* 12.0  --   HCT 36.4 36.4 37.4  --   PLT 222 218 196  --   LABPROT  --   --  13.5  --   INR  --   --  1.04  --   HEPARINUNFRC 0.39 0.51 0.57 0.43  CREATININE 0.70 0.71 0.73  --     Estimated Creatinine Clearance: 42 mL/min (by C-G formula based on SCr of 0.73 mg/dL).   Assessment: [age] YOF with history of afib on warfarin PTA - currently on hold as patient with N/V and recent coffee ground emesis with no bleeding noted. GI ok with heparin, warfarin resumed 7/14. PTA warfarin dose 2 mg daily EXCEPT for 1 mg on TTS.  Heparin level is now therapeutic. No new bleeding per RN   Goal of Therapy:  Heparin level 0.3-0.5 units/ml Monitor platelets by anticoagulation protocol: Yes  Plan:  - Continue IV heparin at 900 units/hr (9 ml/hr) - Monitor daily HL, CBC and s/s of bleeding.   Thank you for allowing pharmacy to be a part of this patient's care.  Vinnie LevelBenjamin Luc Shammas, PharmD., BCPS Clinical Pharmacist Clinical phone for 12/10/17 until 3:30pm: [phone] If after 3:30pm, [facility] for unit-specific pharmacist

## 2017-12-11 DIAGNOSIS — K219 Gastro-esophageal reflux disease without esophagitis: Secondary | ICD-10-CM

## 2017-12-11 LAB — HEPARIN LEVEL (UNFRACTIONATED): HEPARIN UNFRACTIONATED: 0.31 [IU]/mL (ref 0.30–0.70)

## 2017-12-11 LAB — GLUCOSE, CAPILLARY
GLUCOSE-CAPILLARY: 104 mg/dL — AB (ref 70–99)
Glucose-Capillary: 110 mg/dL — ABNORMAL HIGH (ref 70–99)
Glucose-Capillary: 110 mg/dL — ABNORMAL HIGH (ref 70–99)
Glucose-Capillary: 111 mg/dL — ABNORMAL HIGH (ref 70–99)
Glucose-Capillary: 111 mg/dL — ABNORMAL HIGH (ref 70–99)
Glucose-Capillary: 96 mg/dL (ref 70–99)

## 2017-12-11 LAB — CBC
HCT: 37.5 % (ref 36.0–46.0)
Hemoglobin: 12 g/dL (ref 12.0–15.0)
MCH: 30.8 pg (ref 26.0–34.0)
MCHC: 32 g/dL (ref 30.0–36.0)
MCV: 96.4 fL (ref 78.0–100.0)
PLATELETS: 162 10*3/uL (ref 150–400)
RBC: 3.89 MIL/uL (ref 3.87–5.11)
RDW: 13.2 % (ref 11.5–15.5)
WBC: 7.7 10*3/uL (ref 4.0–10.5)

## 2017-12-11 LAB — PROTIME-INR
INR: 1.05
PROTHROMBIN TIME: 13.6 s (ref 11.4–15.2)

## 2017-12-11 MED ORDER — ENSURE ENLIVE PO LIQD: 237.0000 mL | Freq: Two times a day (BID) | ORAL | Status: DC | PRN

## 2017-12-11 MED ORDER — WARFARIN SODIUM 3 MG PO TABS
3.0000 mg | ORAL_TABLET | Freq: Once | ORAL | Status: AC
  Administered 2017-12-11: 3 mg via ORAL
  Filled 2017-12-11: qty 1

## 2017-12-11 NOTE — Progress Notes (Signed)
ANTICOAGULATION CONSULT NOTE - Follow Up Consult  Pharmacy Consult:  Heparin Indication: atrial fibrillation and pulmonary embolus  Allergies  Allergen Reactions  . Ace Inhibitors Other (See Comments)    Cough  . Fosamax [Alendronate Sodium] Other (See Comments)    Heart burn  . Norvasc [Amlodipine Besylate] Other (See Comments)    edema  . Zocor [Simvastatin] Other (See Comments)    Elevated CPK    Patient Measurements: Height: 5\' 5"  (165.1 cm) Weight: 130 lb (59 kg)(pt had multiple pillows in bed, states she weighs abotu 127b) IBW/kg (Calculated) : 57  Vital Signs: Temp: 97.5 F (36.4 C) (07/16 0454) Temp Source: Oral (07/16 0454) BP: 152/98 (07/16 0454) Pulse Rate: 64 (07/16 0454)  Labs: Recent Labs    12/09/17 0409 12/10/17 0406 12/10/17 2040 12/11/17 0430  HGB 11.7* 12.0  --  12.0  HCT 36.4 37.4  --  37.5  PLT 218 196  --  162  LABPROT  --  13.5  --  13.6  INR  --  1.04  --  1.05  HEPARINUNFRC 0.51 0.57 0.43 0.31  CREATININE 0.71 0.73  --   --     Estimated Creatinine Clearance: 42.1 mL/min (by C-G formula based on SCr of 0.73 mg/dL).   Assessment: [age] YOF with history of afib on warfarin PTA - currently on hold as patient with N/V and recent coffee ground emesis with no bleeding noted. GI ok with heparin, warfarin resumed 7/14. PTA warfarin dose 2 mg daily EXCEPT for 1 mg on TTS.  Heparin level this morning is therapeutic (HL 0.31 << 0.43, goal of 0.3-0.5). INR remains SUBtherapeutic (INR 1.05 <<1.04, goal of 2-3). CBC wnl, no bleeding reported.  Goal of Therapy:  Heparin level 0.3-0.5 units/ml Monitor platelets by anticoagulation protocol: Yes  Plan:  - Continue Heparin at 900 units/hr (9 ml/hr) - Warfarin 3 mg x 1 dose at 1800 today - Will continue to monitor for any signs/symptoms of bleeding and will follow up with heparin level in 8 hours and PT/INR in the a.m.   Thank you for allowing pharmacy to be a part of this patient's care.  Georgina PillionElizabeth  Bailen Geffre, PharmD, BCPS Clinical Pharmacist Pager: [phone] Clinical phone for 12/11/2017 from 7a-3:30p: [phone] If after 3:30p, please call main pharmacy at: [phone] 12/11/2017 8:27 AM

## 2017-12-11 NOTE — Progress Notes (Signed)
Nutrition Follow-up  INTERVENTION:   - Ensure Enlive po PRN, each supplement provides 350 kcal and 20 grams of protein  NUTRITION DIAGNOSIS:   Inadequate oral intake related to altered GI function as evidenced by NPO status.  Progressing as pt is now on Dysphagia 1 diet  GOAL:   Patient will meet greater than or equal to 90% of their needs  Progressing, being addressed with diet advancement and oral nutrition supplements  MONITOR:   PO intake, Diet advancement, Weight trends, Skin, I & O's, Labs  REASON FOR ASSESSMENT:   Consult Other (Comment)(Pt has large hiatal hernia and gen surg recommend avoiding solid food, not surgical candidate. family not happy w/ "full liquid" diet, please help get real foods/ ?pureed, and avoid supplements/ processed foods if at all possible)  ASSESSMENT:   Lisa Hopkins is a 84 y.o. female with medical history significant for hypertension, history of diabetes now diet controlled, chronic kidney disease stage II, history of CVA, dementia, and atrial fibrillation on warfarin, now presenting to the emergency department for evaluation of upper abdominal pain, nausea, and nonbloody vomiting.  7/7 - s/p PICC line placement, TPN initiated 7/9 - advanced to clear liquids,  7/11 -Boost Breeze ordered BID, advanced to full liquids 7/13 - TPN d/c 7/15 - advanced to Dysphagia 1 diet  Spoke with pt at bedside. Pt with 50% completed Dysphagia 1 breakfast meal tray at time of visit. Pt states she is full. Pt denies pain but endorses nausea. RD provided blue emesis bag to pt at recliner.  Also spoke with MD who reports that pt has been experiencing diarrhea. MD also reports family is concerned with the amount of sugary beverages and foods pt has been receiving (popsicles, supplements, etc.). MD would like to d/c oral nutrition supplements at this time. RD to d/c Ensure Enlive BID but keep PRN.  Pt drinking vanilla Ensure Enlive at time of visit and states  that she likes them.  MD reports family with questions regarding whether pt can receive scrambled eggs. Noted surgery has recommended full liquid/pureed diet at this time. MD reports concerns regarding pt's swallowing abilities. MD placed order for speech evaluation to determine appropriate diet for pt.  Meal Completion: 20-80%  Medications reviewed and include: 2000 units vitamin D daily, Ensure Enlive BID, sliding scale Novolog, 40 mg Protonix BID, warfarin  Labs reviewed. CBG's: 96, 110, 110, 150, 120 x 24 hours   Diet Order:   Diet Order           DIET - DYS 1 Room service appropriate? Yes; Fluid consistency: Thin  Diet effective now          EDUCATION NEEDS:   Education needs have been addressed  Skin:  Skin Assessment: Reviewed RN Assessment  Last BM:  12/10/17  Height:   Ht Readings from Last 1 Encounters:  11/30/17 5\' 5"  (1.651 m)    Weight:   Wt Readings from Last 1 Encounters:  12/11/17 130 lb (59 kg)    Ideal Body Weight:  56.8 kg  BMI:  Body mass index is 21.63 kg/m.  Estimated Nutritional Needs:   Kcal:  1450-1650 kcal/day  Protein:  70-85 grams/day  Fluid:  > 1.4 L/day    Earma ReadingKate Jablonski Giang Hemme, MS, RD, LDN Pager: [phone] Weekend/After Hours: [phone]

## 2017-12-11 NOTE — Evaluation (Signed)
Clinical/Bedside Swallow Evaluation Patient Details  Name: Lisa Hopkins MRN: [id number] Date of Birth: Apr 26, 1925  Today's Date: 12/11/2017 Time: SLP Start Time (ACUTE ONLY): 1530 SLP Stop Time (ACUTE ONLY): 1545 SLP Time Calculation (min) (ACUTE ONLY): 15 min  Past Medical History:  Past Medical History:  Diagnosis Date  . Anxiety   . Aphasia as late effect of cerebrovascular accident   . Atrial fibrillation (HCC)   . CKD (chronic kidney disease), stage II   . CVA (cerebral infarction)   . Diabetes mellitus type II   . GERD (gastroesophageal reflux disease)   . Hiatal hernia   . HTN (hypertension)   . Hyperlipidemia   . IBS (irritable bowel syndrome)   . Stroke (HCC)   . Vitamin D deficiency    Past Surgical History:  Past Surgical History:  Procedure Laterality Date  . CATARACT EXTRACTION, BILATERAL    . CHOLECYSTECTOMY    . COLONOSCOPY  2005  . ESOPHAGOGASTRODUODENOSCOPY (EGD) WITH PROPOFOL N/A 11/28/2017   Procedure: ESOPHAGOGASTRODUODENOSCOPY (EGD) WITH PROPOFOL;  Surgeon: Graylin Shiver, MD;  [facility];  Service: Endoscopy;  Laterality: N/A;  . TONSILLECTOMY    . UPPER GASTROINTESTINAL ENDOSCOPY  2005   HPI:  84 y.o. female with medical history significant for hypertension, history of diabetes now diet controlled, chronic kidney disease stage II, history of CVA, dementia, and atrial fibrillation on warfarin, now presenting to the emergency department for evaluation of upper abdominal pain, nausea, and nonbloody vomiting.  Recent admission 6/30-7/4 for same symptoms, followed by GI, had EGD, complaints attributed to large HH, also with findings of mild proximal stenosis of esophagus. She improved with medical management during the hospitalization, was discharged in much improved condition, and was planning to follow-up with surgery as an outpatient for possible surgical treatment.  Unfortunately, soon after leaving the hospital, her pain became severe again  and she had recurrence and nausea with nonbloody vomiting. Pt high risk for surgery; surgery rec continue full liquid diet, possibly permanently. Requested by MD to evaluate for potential to advance from full liquids.    Assessment / Plan / Recommendation Clinical Impression  Pt presents with no oropharyngeal dysphagia, but discomfort, belching, globus when eating - she has been tolerating a dysphagia 1 diet since yesterday without acute pain.  We discussed some basic strategies to deal with her esophageal issues (proximal stenosis)/HH, although they may only help marginally given the nature of her deficits.  We discussed elevating her head-of-bed 4-6 inches (her son states he will handle), not eating/drinking two hours before bedtime, alternating solid/liquid boluses. Pt is already eating many soups, soft foods, but has been battling diarrhea for several days.  Recommended that she talk with the dietician in greater detail about the kinds of foods that may be more tolerable.  Continue pureed foods/thin liquids while here.  Her son will talk with staff at [facility] to assist with meal consistency/preparation.  No further SLP f/u needed - our service will sign off. SLP Visit Diagnosis: Dysphagia, unspecified (R13.10)    Aspiration Risk  No limitations    Diet Recommendation     Medication Administration: Whole meds with liquid    Other  Recommendations Oral Care Recommendations: Oral care BID   Follow up Recommendations None      Frequency and Duration            Prognosis        Swallow Study   General HPI: 84 y.o. female with medical history significant for  hypertension, history of diabetes now diet controlled, chronic kidney disease stage II, history of CVA, dementia, and atrial fibrillation on warfarin, now presenting to the emergency department for evaluation of upper abdominal pain, nausea, and nonbloody vomiting.  Recent admission 6/30-7/4 for same symptoms, followed by GI, had  EGD, complaints attributed to large HH, also with findings of mild proximal stenosis of esophagus. She improved with medical management during the hospitalization, was discharged in much improved condition, and was planning to follow-up with surgery as an outpatient for possible surgical treatment.  Unfortunately, soon after leaving the hospital, her pain became severe again and she had recurrence and nausea with nonbloody vomiting. Pt high risk for surgery; surgery rec continue full liquid diet, possibly permanently. Requested by MD to evaluate for potential to advance from full liquids.  Type of Study: Bedside Swallow Evaluation Previous Swallow Assessment: no Diet Prior to this Study: Dysphagia 1 (puree);Thin liquids Temperature Spikes Noted: No Respiratory Status: Room air History of Recent Intubation: No Behavior/Cognition: Alert;Cooperative;Pleasant mood Oral Cavity Assessment: Within Functional Limits Oral Care Completed by SLP: No Oral Cavity - Dentition: Adequate natural dentition Vision: Functional for self-feeding Self-Feeding Abilities: Able to feed self Patient Positioning: Upright in bed Baseline Vocal Quality: Normal Volitional Cough: Strong Volitional Swallow: Able to elicit    Oral/Motor/Sensory Function Overall Oral Motor/Sensory Function: Within functional limits   Ice Chips Ice chips: Within functional limits   Thin Liquid Thin Liquid: Within functional limits    Nectar Thick Nectar Thick Liquid: Not tested   Honey Thick Honey Thick Liquid: Not tested   Puree Puree: Within functional limits   Solid   GO   Solid: Not tested        Lisa Hopkins 12/11/2017,4:02 PM

## 2017-12-11 NOTE — Progress Notes (Addendum)
PROGRESS NOTE    Lisa Hopkins  UJW:[id number] DOB: 22-Mar-1925 DOA: 11/29/2017 PCP: Lucky Cowboy, MD   Brief Narrative: 84 y.o.femalewith medical history significant forhypertension, history of diabetes now diet controlled, chronic kidney disease stage II, history of CVA, dementia, and atrial fibrillation on warfarin, now presenting to the emergency department for evaluation of upper abdominal pain, nausea, and nonbloody vomiting. Patient was admitted to the hospital from 11/25/2017 until 11/29/2017 for the same symptoms, had GI and surgical consultations, underwent CT of the abdomen and pelvis and EGD, and her complaints were attributed to large hiatal hernia. She improved with medical management during the hospitalization, was discharged in much improved condition, and was planning to follow-up with surgery as an outpatient for possible surgical treatment. Unfortunately, soon after leaving the hospital, her pain became severe again and she had recurrence and nausea with nonbloody vomiting. She reports the symptoms to be the same as those during the recent hospitalization, denies fevers or chills, and denies chest pain or shortness of breath. Her son is at the bedside and assists with the history.  ED Course:Upon arrival to the ED, patient is found to be afebrile, saturating well on room air, mildly hypertensive, and vitals otherwise normal. Chemistry panel is notable for a potassium of 3.3 and bilirubin 1.6. CBC is notable for a mild polycythemia. Patient was given 500 cc normal saline, IV morphine, and IV Zofran in the ED. She had transient improvement in her symptoms with these measures in the ED, but pain became unbearable and she developed recurrent nausea with vomiting within about an hour.  Admitted with recurrent nausea, vomiting, abdominal pain 24 hours after recent admission. Symptoms are thought to be related to large hiatal hernia. She was evaluated by cardiology who  thought patient was high risk, but ok to proceed with surgery if needed. She was evaluated by surgery, Dr Ezzard Standing wants to give another trial of clears, if patient fail she might need sx.   Assessment & Plan:   Principal Problem:   Intractable abdominal pain Active Problems:   Essential hypertension   Atrial fibrillation (HCC)   History of CVA (cerebrovascular accident)   CKD stage 2 due to type 2 diabetes mellitus (HCC)   # Refractory abd pain/ nausea/ vomiting/ large hiatal (paraesophageal) hernia - Endoscopy 7-3; showed proximal esophageal wed, stomach shows hiatal hernia - admitted 6/30 > 7/4 for w/u then dc'd home then readmitted 7/5 with more abd pain and nausea, vomiting - surgery and GI consulted again - seen by cardiology pre-op risk assessment - Per gen surgery pt is not a good candidate for hiatal hernia repair. Pt has a high perioperative cardiac risk. Pt was weaned off of TNA and surgery recommends to continue full liquid diet, possible permanently. Gen surg signed off 7/15. Pt will f/u w/ Dr Ovidio Kin at [facility] office - Patient having diarrhea on full liquid diet, see below - dc'd IV MSO4 and dc'd prn IV antiemetics, cont po zofran prn - main c/o is postprandial pain in epigastric area; denies any active issues of reflux/ regurgitation/ dysphagia. She is on bid PPI which is main medical Rx recommendation for symptomatic hiatal hernias.    # Coffee ground emesis: resolved   # Diarrhea - started cholestyramine and asked Speech/ Diet teams to help get patient on something more substantial than full liquid diet and to try and get patient off of the supplemental drinks.   # Hx chronic afib/ hx CVA: on coumadin at home - gen  surg signed off - coumadin restarted 7/14, awaiting therapeutic INR  # HTN: bp's still on the high side a bit - on home doses of metoprolol and losartan - will dc IV prn's  # DNR  # Debility/ disposition: patient came from independent living at  [facility]. Per SW notes if pt goes home her insurance won't cover 24 hr care and if this was desired family would have to pay out of pocket.  Also SW told me that insurance won't cover SNF at this time given pt's PT recommendations and current capacities.  - per PT recommendation is for dc to [facility] independent living - have d/w patient at length today. She was living in independent living w/ her husband for some time , then her husband died around 13-Aug-2022 this year.  She has been alone there since then, w/ family more recently staying w/ her at night.  We talked about going back to prior arrangement ([facility] ind[facility] living), she thinks she can care for herself there. She makes her own breakfast and lunch and walks to cafeteria for dinner.   - family was worried yesterday about diarrhea , have started cholestyramine and asked Speech/ Diet teams to help get patient on something more substantial than full liquid diet and to try and get patient off of the supplemental drinks.  Appreciate any assistance - anticipate dc later this week back to [facility] when INR is in range      Vinson Moselle MD [facility] pgr [phone] 10/21/2017, 9:25 AM    DVT prophylaxis:heparin Code Status: dnr Family Communication: at bedside Disposition Plan: dc to [facility] when medically ready Consultants:  gen surg Procedures: none Antimicrobials: none  Subjective:  up in bed, no c/o   Objective: Vitals:   12/10/17 1505 12/10/17 2241 12/11/17 0454 12/11/17 0500  BP: (!) 154/71 (!) 159/85 (!) 152/98   Pulse: 62 69 64   Resp: 20 16 16    Temp: 97.9 F (36.6 C) 97.6 F (36.4 C) (!) 97.5 F (36.4 C)   TempSrc: Oral Oral Oral   SpO2: 97% 96% 97%   Weight:    59 kg (130 lb)  Height:        Intake/Output Summary (Last 24 hours) at 12/11/2017 1320 Last data filed at 12/11/2017 0900 Gross per 24 hour  Intake 740 ml  Output 400 ml  Net 340 ml   Filed Weights   12/05/17 0500  12/07/17 0439 12/11/17 0500  Weight: 57.4 kg (126 lb 8.7 oz) 56.9 kg (125 lb 7.1 oz) 59 kg (130 lb)    Examination:  General exam: Appears calm and comfortable  Respiratory system: Clear to auscultation. Respiratory effort normal. Cardiovascular system: S1 & S2 heard, RRR. No JVD, murmurs, rubs, gallops or clicks. No pedal edema. Gastrointestinal system: Abdomen is nondistended, soft and nontender. No organomegaly or masses felt. Normal bowel sounds heard. Central nervous system: Alert and oriented. No focal neurological deficits. Extremities: Symmetric 5 x 5 power. Skin: No rashes, lesions or ulcers Psychiatry: Judgement and insight appear normal. Mood & affect appropriate.     Data Reviewed: I have personally reviewed following labs and imaging studies  CBC: Recent Labs  Lab 12/06/17 0327 12/08/17 0316 12/09/17 0409 12/10/17 0406 12/11/17 0430  WBC 8.7 11.5* 8.3 7.4 7.7  HGB 12.2 11.7* 11.7* 12.0 12.0  HCT 38.7 36.4 36.4 37.4 37.5  MCV 98.0 97.3 97.6 95.9 96.4  PLT 238 222 218 196 162   Basic Metabolic Panel: Recent Labs  Lab 12/06/17 0327 12/07/17 0600 12/08/17 0316 12/09/17 0409 12/10/17 0406  NA 138 138 134* 140 139  K 3.9 4.1 4.1 4.2 4.1  CL 105 104 102 107 104  CO2 27 26 25 26 26   GLUCOSE 142* 157* 150* 120* 124*  BUN 17 18 17 15 14   CREATININE 0.72 0.64 0.70 0.71 0.73  CALCIUM 8.5* 8.7* 9.0 9.1 9.2  MG 2.0  --   --   --   --   PHOS 3.1  --   --   --   --    GFR: Estimated Creatinine Clearance: 42.1 mL/min (by C-G formula based on SCr of 0.73 mg/dL). Liver Function Tests: Recent Labs  Lab 12/06/17 0327  AST 21  ALT 16  ALKPHOS 66  BILITOT 0.6  PROT 5.0*  ALBUMIN 2.5*   No results for input(s): LIPASE, AMYLASE in the last 168 hours. No results for input(s): AMMONIA in the last 168 hours. Coagulation Profile: Recent Labs  Lab 12/10/17 0406 12/11/17 0430  INR 1.04 1.05   Cardiac Enzymes: No results for input(s): CKTOTAL, CKMB, CKMBINDEX,  TROPONINI in the last 168 hours. BNP (last 3 results) No results for input(s): PROBNP in the last 8760 hours. HbA1C: No results for input(s): HGBA1C in the last 72 hours. CBG: Recent Labs  Lab 12/10/17 1844 12/11/17 0053 12/11/17 0600 12/11/17 0654 12/11/17 1233  GLUCAP 150* 110* 110* 96 111*   Lipid Profile: No results for input(s): CHOL, HDL, LDLCALC, TRIG, CHOLHDL, LDLDIRECT in the last 72 hours. Thyroid Function Tests: No results for input(s): TSH, T4TOTAL, FREET4, T3FREE, THYROIDAB in the last 72 hours. Anemia Panel: No results for input(s): VITAMINB12, FOLATE, FERRITIN, TIBC, IRON, RETICCTPCT in the last 72 hours. Sepsis Labs: No results for input(s): PROCALCITON, LATICACIDVEN in the last 168 hours.  No results found for this or any previous visit (from the past 240 hour(s)).       Radiology Studies: No results found.      Scheduled Meds: . cholecalciferol  2,000 Units Oral Daily  . cholestyramine  4 g Oral BID  .  HYDROmorphone (DILAUDID) injection  0.5 mg Intravenous Once  . insulin aspart  0-9 Units Subcutaneous Q6H  . losartan  50 mg Oral Daily  . mouth rinse  15 mL Mouth Rinse BID  . metoprolol tartrate  12.5 mg Oral BID  . pantoprazole  40 mg Oral BID  . sodium chloride flush  10-40 mL Intracatheter Q12H  . warfarin  3 mg Oral ONCE-1800  . Warfarin - Pharmacist Dosing Inpatient   Does not apply q1800   Continuous Infusions: . heparin 900 Units/hr (12/10/17 2141)     LOS: 11 days    If 7PM-7AM, please contact night-coverage [email or web address] Password [email or web address] 12/11/2017, 1:20 PM

## 2017-12-12 DIAGNOSIS — R1111 Vomiting without nausea: Secondary | ICD-10-CM

## 2017-12-12 LAB — PROTIME-INR
INR: 1.18
Prothrombin Time: 14.9 seconds (ref 11.4–15.2)

## 2017-12-12 LAB — CBC
HEMATOCRIT: 37.7 % (ref 36.0–46.0)
Hemoglobin: 12.1 g/dL (ref 12.0–15.0)
MCH: 31.1 pg (ref 26.0–34.0)
MCHC: 32.1 g/dL (ref 30.0–36.0)
MCV: 96.9 fL (ref 78.0–100.0)
Platelets: 151 10*3/uL (ref 150–400)
RBC: 3.89 MIL/uL (ref 3.87–5.11)
RDW: 13.2 % (ref 11.5–15.5)
WBC: 6.7 10*3/uL (ref 4.0–10.5)

## 2017-12-12 LAB — GLUCOSE, CAPILLARY
GLUCOSE-CAPILLARY: 119 mg/dL — AB (ref 70–99)
Glucose-Capillary: 125 mg/dL — ABNORMAL HIGH (ref 70–99)
Glucose-Capillary: 104 mg/dL — ABNORMAL HIGH (ref 70–99)

## 2017-12-12 LAB — HEPARIN LEVEL (UNFRACTIONATED): HEPARIN UNFRACTIONATED: 0.36 [IU]/mL (ref 0.30–0.70)

## 2017-12-12 MED ORDER — WARFARIN SODIUM 3 MG PO TABS
3.0000 mg | ORAL_TABLET | Freq: Once | ORAL | Status: AC
  Administered 2017-12-12: 3 mg via ORAL
  Filled 2017-12-12: qty 1

## 2017-12-12 MED ORDER — HEPARIN (PORCINE) IN NACL 100-0.45 UNIT/ML-% IJ SOLN
900.0000 [IU]/h | INTRAMUSCULAR | Status: AC
  Filled 2017-12-12: qty 250

## 2017-12-12 MED ORDER — ENOXAPARIN SODIUM 60 MG/0.6ML ~~LOC~~ SOLN
60.0000 mg | Freq: Two times a day (BID) | SUBCUTANEOUS | Status: DC
  Administered 2017-12-12 – 2017-12-13 (×2): 60 mg via SUBCUTANEOUS
  Filled 2017-12-12 (×2): qty 0.6

## 2017-12-12 NOTE — Progress Notes (Signed)
Occupational Therapy Treatment Patient Details Name: Lisa Hopkins MRN: [id number] DOB: November 14, 1924 Today's Date: 12/12/2017    History of present illness Pt is a 84 y/o female with a PMH significant for HTN, history of diabetes now diet controlled, CKDII, CVA, dementia, and a-fib on warfarin, now presenting to the emergency department for evaluation of upper abdominal pain, nausea, and nonbloody vomiting.  Patient was admitted to the hospital from 11/25/2017 until 11/29/2017 for the same symptoms, underwent CT of the abdomen/pelvis and EGD, and she was found to have a large hiatal hernia. She was discharged in much improved condition, and was planning to follow-up with surgery as an outpatient for possible surgical treatment.  Unfortunately, soon after leaving the hospital, her pain became severe again and she had recurrence and nausea with nonbloody vomiting.    OT comments  Pt progressing well. Eager to return to [facility] [facility], but will have more care. Performed dressing, toileting, standing grooming and walked unit with RW with supervision.   Follow Up Recommendations  Home health OT;Supervision/Assistance - 24 hour    Equipment Recommendations       Recommendations for Other Services      Precautions / Restrictions Precautions Precautions: Fall       Mobility Bed Mobility Overal bed mobility: Modified Independent             General bed mobility comments: returned to bed  Transfers Overall transfer level: Needs assistance Equipment used: Rolling walker (2 wheeled) Transfers: Sit to/from Stand Sit to Stand: Supervision         General transfer comment: pt without awareness of IV line    Balance Overall balance assessment: Needs assistance   Sitting balance-Leahy Scale: Good       Standing balance-Leahy Scale: Fair Standing balance comment: statically during ADL                           ADL either performed or assessed with clinical judgement    ADL Overall ADL's : Needs assistance/impaired     Grooming: Wash/dry hands;Standing;Supervision/safety           Upper Body Dressing : Set up;Sitting Upper Body Dressing Details (indicate cue type and reason): front opening gown Lower Body Dressing: Supervision/safety;Sit to/from stand   Toilet Transfer: Supervision/safety;Ambulation;RW;Comfort height toilet   Toileting- Clothing Manipulation and Hygiene: Supervision/safety;Sit to/from stand       Functional mobility during ADLs: Supervision/safety;Rolling walker(walked entire unit) General ADL Comments: pt fatigued at end of session, but did not require rest breaks with walking unit     Vision       Perception     Praxis      Cognition Arousal/Alertness: Awake/alert Behavior During Therapy: Flat affect Overall Cognitive Status: History of cognitive impairments - at baseline                                 General Comments: pt stating she is getting ready to turn 89, baseline dementia        Exercises     Shoulder Instructions       General Comments      Pertinent Vitals/ Pain       Pain Assessment: No/denies pain  Home Living  Prior Functioning/Environment              Frequency  Min 2X/week        Progress Toward Goals  OT Goals(current goals can now be found in the care plan section)  Progress towards OT goals: Progressing toward goals  Acute Rehab OT Goals Patient Stated Goal: to go home OT Goal Formulation: With patient Time For Goal Achievement: 12/20/17 Potential to Achieve Goals: Good  Plan Discharge plan remains appropriate    Co-evaluation                 AM-PAC PT "6 Clicks" Daily Activity     Outcome Measure   Help from another person eating meals?: None Help from another person taking care of personal grooming?: A Little Help from another person toileting, which includes using toliet,  bedpan, or urinal?: A Little Help from another person bathing (including washing, rinsing, drying)?: A Little Help from another person to put on and taking off regular upper body clothing?: None Help from another person to put on and taking off regular lower body clothing?: A Little 6 Click Score: 20    End of Session Equipment Utilized During Treatment: Gait belt;Rolling walker  OT Visit Diagnosis: Unsteadiness on feet (R26.81)   Activity Tolerance Patient tolerated treatment well   Patient Left in bed;with call bell/phone within reach;with bed alarm set   Nurse Communication          Time: 4259-5638 OT Time Calculation (min): 33 min  Charges: OT General Charges $OT Visit: 1 Visit OT Treatments $Self Care/Home Management : 8-22 mins $Therapeutic Activity: 8-22 mins  12/12/2017 Martie Round, OTR/L Pager: [phone]   Evern Bio 12/12/2017, 3:42 PM

## 2017-12-12 NOTE — Progress Notes (Addendum)
ANTICOAGULATION CONSULT NOTE - Follow Up Consult  Pharmacy Consult:  Heparin >> Lovenox (see addendum) Indication: atrial fibrillation and pulmonary embolus  Allergies  Allergen Reactions  . Ace Inhibitors Other (See Comments)    Cough  . Fosamax [Alendronate Sodium] Other (See Comments)    Heart burn  . Norvasc [Amlodipine Besylate] Other (See Comments)    edema  . Zocor [Simvastatin] Other (See Comments)    Elevated CPK    Patient Measurements: Height: 5\' 5"  (165.1 cm) Weight: 130 lb (59 kg)(pt had multiple pillows in bed, states she weighs abotu 127b) IBW/kg (Calculated) : 57  Vital Signs: Temp: 98 F (36.7 C) (07/17 0515) Temp Source: Oral (07/17 0515) BP: 158/79 (07/17 0515) Pulse Rate: 72 (07/17 0515)  Labs: Recent Labs    12/10/17 0406 12/10/17 2040 12/11/17 0430 12/12/17 0429  HGB 12.0  --  12.0 12.1  HCT 37.4  --  37.5 37.7  PLT 196  --  162 151  LABPROT 13.5  --  13.6 14.9  INR 1.04  --  1.05 1.18  HEPARINUNFRC 0.57 0.43 0.31 0.36  CREATININE 0.73  --   --   --     Estimated Creatinine Clearance: 42.1 mL/min (by C-G formula based on SCr of 0.73 mg/dL).   Assessment: [age] YOF with history of afib on warfarin PTA - currently on hold as patient with N/V and recent coffee ground emesis with no bleeding noted. GI ok with heparin, warfarin resumed 7/14. PTA warfarin dose 2 mg daily EXCEPT for 1 mg on TTS.  Heparin level this morning is therapeutic (HL 0.36 << 0.31, goal of 0.3-0.5). INR remains SUBtherapeutic (INR 1.18 << 1.05, goal of 2-3). CBC wnl, no bleeding reported.   Noted INR is slow to respond however already giving the patient 2x her average home dose. Will consider increasing the Warfarin dose on 7/18 if the INR is still slow to respond.   Goal of Therapy:  Heparin level 0.3-0.5 units/ml Monitor platelets by anticoagulation protocol: Yes  Plan:  - Continue Heparin at 900 units/hr (9 ml/hr) - Warfarin 3 mg x 1 dose at 1800 today - Will  continue to monitor for any signs/symptoms of bleeding and will follow up with heparin level in 8 hours and PT/INR in the a.m.   Thank you for allowing pharmacy to be a part of this patient's care.  Georgina PillionElizabeth Carmine Youngberg, PharmD, BCPS Clinical Pharmacist Pager: [phone] Clinical phone for 12/12/2017 from 7a-3:30p: [phone] If after 3:30p, please call main pharmacy at: [phone] 12/12/2017 8:41 AM    -------------------------------------------------------------- Addendum:  The plan is now to transition the patient to Lovenox for discharge planning. Will continue Heparin until this evening at a more appropriate time to make this transition with bid Lovenox dosing.   Plan - Continue Heparin at 900 units/hr - stop at 1759 - Start Lovenox 60 mg SQ every 12 hours starting at 1800 - Will watch renal function for need to adjust doses  Georgina PillionElizabeth Roux Brandy, PharmD, BCPS Pager: [phone] 1:35 PM

## 2017-12-12 NOTE — Progress Notes (Signed)
PROGRESS NOTE    Lisa Hopkins  ZOX:[id number] DOB: 02-23-1925 DOA: 11/29/2017 PCP: Lucky CowboyMcKeown, William, MD    Brief Narrative:  84 year old female who presented with abdominal pain, nausea and vomiting.  She does have the significant past medical history for hypertension, type 2 diabetes mellitus, chronic kidney disease, dementia, history of CVA and atrial fibrillation.  Patient developed upper abdominal pain, nausea and nonbloody diarrhea.  Has been recently diagnosed with a large hiatal hernia.  Initial physical examination blood pressure 195/115, heart rate 94, respiratory rate 16, temperature 97.6 oxygen pressure percent.  She was ill looking appearing, her mucous membranes were moist, lungs clear to auscultation bilaterally, heart S1-S2 present rhythmic, the abdomen was soft, tender in the upper abdomen without rebound or guarding, no lower extremity edema.  She was admitted to the hospital with the working diagnosis of intractable abdominal pain due to large hiatal hernia.  Assessment & Plan:   Principal Problem:   Intractable abdominal pain Active Problems:   Essential hypertension   Atrial fibrillation (HCC)   History of CVA (cerebrovascular accident)   CKD stage 2 due to type 2 diabetes mellitus (HCC)   1. Symptomatic large hiatal hernia. Symptoms have improved with medical therapy, patient is able to tolerate po with no further nausea or vomiting. Not candidate for surgical intervention due to surgical risk. Patient has been out of bed and ambulating. Continue pantoprazole and aspiration precautions.   2. Diarrhea. Clinically continue to improve.   3, HTN. Will continue blood pressure control with metoprolol and losartan.   4, Atrial fibrillation, continue rate control, will change IV heparin for enoxaparin in preparation for dc in am, with enoxaparin bridging, continue rate control with metoprolol     DVT prophylaxis: enoxaparin   Code Status:  dnr Family  Communication: I spoke with patient's son at the bedside and all questions were addressed.  Disposition Plan/ discharge barriers: Plan for discharge in am.    Consultants:   GI   Procedures:    Antimicrobials:       Subjective: Patient is feeling better, improved abdominal pain, no nausea or vomiting, no dyspnea or chest pain. Has been ambulating with assistance.   Objective: Vitals:   12/11/17 2150 12/11/17 2238 12/12/17 0515 12/12/17 0951  BP: (!) 176/89 (!) 149/81 (!) 158/79 (!) 143/82  Pulse: 74 76 72 79  Resp: 18  18   Temp: 97.8 F (36.6 C)  98 F (36.7 C)   TempSrc: Oral  Oral   SpO2: 100%  97% 96%  Weight:      Height:        Intake/Output Summary (Last 24 hours) at 12/12/2017 1341 Last data filed at 12/12/2017 0600 Gross per 24 hour  Intake 598 ml  Output -  Net 598 ml   Filed Weights   12/05/17 0500 12/07/17 0439 12/11/17 0500  Weight: 57.4 kg (126 lb 8.7 oz) 56.9 kg (125 lb 7.1 oz) 59 kg (130 lb)    Examination:   General: Not in pain or dyspnea, deconditioned  Neurology: Awake and alert, non focal  E ENT: mild pallor, no icterus, oral mucosa moist Cardiovascular: No JVD. S1-S2 present, rhythmic, no gallops, rubs, or murmurs. No lower extremity edema. Pulmonary: vesicular breath sounds bilaterally, adequate air movement, no wheezing, rhonchi or rales. Gastrointestinal. Abdomen with no organomegaly, non tender, no rebound or guarding. Mild distended.  Skin. No rashes Musculoskeletal: no joint deformities     Data Reviewed: I have personally reviewed following labs and  imaging studies  CBC: Recent Labs  Lab 12/08/17 0316 12/09/17 0409 12/10/17 0406 12/11/17 0430 12/12/17 0429  WBC 11.5* 8.3 7.4 7.7 6.7  HGB 11.7* 11.7* 12.0 12.0 12.1  HCT 36.4 36.4 37.4 37.5 37.7  MCV 97.3 97.6 95.9 96.4 96.9  PLT 222 218 196 162 151   Basic Metabolic Panel: Recent Labs  Lab 12/06/17 0327 12/07/17 0600 12/08/17 0316 12/09/17 0409 12/10/17 0406   NA 138 138 134* 140 139  K 3.9 4.1 4.1 4.2 4.1  CL 105 104 102 107 104  CO2 27 26 25 26 26   GLUCOSE 142* 157* 150* 120* 124*  BUN 17 18 17 15 14   CREATININE 0.72 0.64 0.70 0.71 0.73  CALCIUM 8.5* 8.7* 9.0 9.1 9.2  MG 2.0  --   --   --   --   PHOS 3.1  --   --   --   --    GFR: Estimated Creatinine Clearance: 42.1 mL/min (by C-G formula based on SCr of 0.73 mg/dL). Liver Function Tests: Recent Labs  Lab 12/06/17 0327  AST 21  ALT 16  ALKPHOS 66  BILITOT 0.6  PROT 5.0*  ALBUMIN 2.5*   No results for input(s): LIPASE, AMYLASE in the last 168 hours. No results for input(s): AMMONIA in the last 168 hours. Coagulation Profile: Recent Labs  Lab 12/10/17 0406 12/11/17 0430 12/12/17 0429  INR 1.04 1.05 1.18   Cardiac Enzymes: No results for input(s): CKTOTAL, CKMB, CKMBINDEX, TROPONINI in the last 168 hours. BNP (last 3 results) No results for input(s): PROBNP in the last 8760 hours. HbA1C: No results for input(s): HGBA1C in the last 72 hours. CBG: Recent Labs  Lab 12/11/17 1233 12/11/17 1823 12/11/17 2344 12/12/17 0511 12/12/17 1211  GLUCAP 111* 111* 104* 125* 104*   Lipid Profile: No results for input(s): CHOL, HDL, LDLCALC, TRIG, CHOLHDL, LDLDIRECT in the last 72 hours. Thyroid Function Tests: No results for input(s): TSH, T4TOTAL, FREET4, T3FREE, THYROIDAB in the last 72 hours. Anemia Panel: No results for input(s): VITAMINB12, FOLATE, FERRITIN, TIBC, IRON, RETICCTPCT in the last 72 hours.    Radiology Studies: I have reviewed all of the imaging during this hospital visit personally     Scheduled Meds: . cholecalciferol  2,000 Units Oral Daily  . cholestyramine  4 g Oral BID  . insulin aspart  0-9 Units Subcutaneous Q6H  . losartan  50 mg Oral Daily  . mouth rinse  15 mL Mouth Rinse BID  . metoprolol tartrate  12.5 mg Oral BID  . pantoprazole  40 mg Oral BID  . sodium chloride flush  10-40 mL Intracatheter Q12H  . warfarin  3 mg Oral ONCE-1800    . Warfarin - Pharmacist Dosing Inpatient   Does not apply q1800   Continuous Infusions:   LOS: 12 days        Mauricio Annett Gula, MD [facility] Pager [phone]

## 2017-12-12 NOTE — Progress Notes (Signed)
PT Cancellation Note  Patient Details Name: Lisa GalloFrances H Scannell MRN: [id number] DOB: [birth year]   Cancelled Treatment:    Reason Eval/Treat Not Completed: Pt's lunch just arrived and pt wishes to eat at this time. Will check back as schedule allows to continue with PT POC.    Marylynn PearsonLaura D Jyasia Markoff 12/12/2017, 2:06 PM   Conni SlipperLaura Ellianna Ruest, PT, DPT Acute Rehabilitation Services Pager: [phone]

## 2017-12-13 LAB — GLUCOSE, CAPILLARY
GLUCOSE-CAPILLARY: 107 mg/dL — AB (ref 70–99)
Glucose-Capillary: 103 mg/dL — ABNORMAL HIGH (ref 70–99)

## 2017-12-13 LAB — CBC
HEMATOCRIT: 38 % (ref 36.0–46.0)
HEMOGLOBIN: 12.3 g/dL (ref 12.0–15.0)
MCH: 30.9 pg (ref 26.0–34.0)
MCHC: 32.4 g/dL (ref 30.0–36.0)
MCV: 95.5 fL (ref 78.0–100.0)
Platelets: 154 10*3/uL (ref 150–400)
RBC: 3.98 MIL/uL (ref 3.87–5.11)
RDW: 13.2 % (ref 11.5–15.5)
WBC: 7.1 10*3/uL (ref 4.0–10.5)

## 2017-12-13 LAB — PROTIME-INR
INR: 1.29
Prothrombin Time: 16 seconds — ABNORMAL HIGH (ref 11.4–15.2)

## 2017-12-13 MED ORDER — WARFARIN SODIUM 3 MG PO TABS
3.0000 mg | ORAL_TABLET | Freq: Every day | ORAL | Status: DC
  Filled 2017-12-13: qty 1

## 2017-12-13 MED ORDER — SUCRALFATE 1 G PO TABS: 1.0000 g | ORAL_TABLET | Freq: Four times a day (QID) | ORAL | 0 refills | Status: DC

## 2017-12-13 MED ORDER — ENOXAPARIN SODIUM 60 MG/0.6ML ~~LOC~~ SOLN: 55.0000 mg | Freq: Two times a day (BID) | SUBCUTANEOUS | Status: DC

## 2017-12-13 MED ORDER — PANTOPRAZOLE SODIUM 40 MG PO TBEC: 40.0000 mg | DELAYED_RELEASE_TABLET | Freq: Two times a day (BID) | ORAL | 0 refills | Status: DC

## 2017-12-13 MED ORDER — ENOXAPARIN SODIUM 60 MG/0.6ML ~~LOC~~ SOLN: 55.0000 mg | Freq: Two times a day (BID) | SUBCUTANEOUS | 0 refills | Status: DC

## 2017-12-13 NOTE — Discharge Summary (Signed)
Physician Discharge Summary  Lisa Hopkins XBM:[id number] DOB: 1924-09-02 DOA: 11/29/2017  PCP: Lucky Cowboy, MD  Admit date: 11/29/2017 Discharge date: 12/13/2017  Admitted From: Home (assisted living) Disposition:  Home (assisted living)   Recommendations for Outpatient Follow-up and new medication changes:  1. Follow up with Dr. Oneta Rack in 7 days 2. Increased pantoprazole to bid 3. Added sucralfate for 15 days, before meals  Home Health: yes   Equipment/Devices: rolling walker    Discharge Condition: stable  CODE STATUS: dnr   Diet recommendation: heart healthy   Brief/Interim Summary: 84 year old female who presented with abdominal pain, nausea and vomiting. She does have the significant past medical history for hypertension, type 2 diabetes mellitus, chronic kidney disease, dementia, history of CVA and atrial fibrillation.  Patient developed upper abdominal pain, nausea and nonbloody diarrhea. She has been recently diagnosed with a large hiatal hernia.  Initial physical examination blood pressure 195/115, heart rate 94, respiratory rate 16, temperature 97.6 .  She was ill looking appearing, her mucous membranes were moist, lungs clear to auscultation bilaterally, heart S1-S2 present rhythmic, the abdomen was soft, tender in the upper abdomen without rebound or guarding, no lower extremity edema.  Sodium 136, potassium 3.3, chloride 95, bicarb 24, glucose 149, BUN 14, creatinine 0.94, white count 9.1, hemoglobin 16.6, hematocrit 50.1, platelets 236.  Urinalysis specific gravity 1.017, 30 protein, white cells 0-5.  Chest x-ray with large hiatal hernia.  EKG with atrial fibrillation with normal axis.   She was admitted to the hospital with the working diagnosis of intractable abdominal pain due to large hiatal hernia  1.  Symptomatic large hiatal hernia.  Patient had recent thorough gastrointestinal evaluation with a CT of the abdomen, pelvis and upper endoscopy.  She was placed on  IV fluids, IV antiacids, and as needed IV antiemetics. Patient was seen by general surgery and she was found not a good candidate for surgical intervention.  Patient was placed on parenteral nutrition.  She had slow improvement of her symptoms, diet was progressively advanced and total parenteral nutrition was discontinued.  Patient will be discharge home with twice daily pantoprazole, 4 times daily sucralfate and further reflux precautions including head elevated of the bed at all times and to stay seated after meals.   2.  Chronic atrial fibrillation.  Patient remained rate controlled, her INR was supratherapeutic, patient was placed on enoxaparin for bridge anticoagulation.  Follow-up as an outpatient with Dr. Oneta Rack.  Discharge INR 1.2  3.  Hypertension.  Tolerated well IV fluids, at discharge she will continue metoprolol and losartan.  4.  Deconditioning with inadequate oral intake..  Patient had severe deconditioning related to her poor oral intake, and persistent reflux.  Patient was seen by physical therapy, home health has been arranged.  Patient was seen by nutritionist, nutritional supplements were started.  Discharge Diagnoses:  Principal Problem:   Intractable abdominal pain Active Problems:   Essential hypertension   Atrial fibrillation (HCC)   History of CVA (cerebrovascular accident)   CKD stage 2 due to type 2 diabetes mellitus (HCC)    Discharge Instructions   Allergies as of 12/13/2017      Reactions   Ace Inhibitors Other (See Comments)   Cough   Fosamax [alendronate Sodium] Other (See Comments)   Heart burn   Norvasc [amlodipine Besylate] Other (See Comments)   edema   Zocor [simvastatin] Other (See Comments)   Elevated CPK      Medication List    STOP taking these  medications   ranitidine 300 MG tablet Commonly known as:  ZANTAC     TAKE these medications   CINNAMON PO Take 1 capsule by mouth daily.   dicyclomine 20 MG tablet Commonly known as:   BENTYL Take 1 tablet 3 x day if needed for nausea, bloating , cramping or diarrhea   enoxaparin 60 MG/0.6ML injection Commonly known as:  LOVENOX Inject 0.55 mLs (55 mg total) into the skin every 12 (twelve) hours for 7 days.   losartan 50 MG tablet Commonly known as:  COZAAR TAKE 1 TABLET EVERY MORNING, CHECK BP AT LUNCH-IF BP ABOVE 140/90 TAKE ANOTHER TABLET.   MAGNESIA PO Take 1 tablet by mouth daily.   metoprolol tartrate 25 MG tablet Commonly known as:  LOPRESSOR Take 0.5 tablets (12.5 mg total) by mouth 2 (two) times daily.   ondansetron 4 MG disintegrating tablet Commonly known as:  ZOFRAN ODT Take 1 tablet (4 mg total) by mouth every 8 (eight) hours as needed for nausea or vomiting.   pantoprazole 40 MG tablet Commonly known as:  PROTONIX Take 1 tablet (40 mg total) by mouth 2 (two) times daily. What changed:  when to take this   QUEtiapine 25 MG tablet Commonly known as:  SEROQUEL Take 1 tablet at bedtime What changed:    how much to take  how to take this  when to take this  additional instructions   sucralfate 1 g tablet Commonly known as:  CARAFATE Take 1 tablet (1 g total) by mouth 4 (four) times daily for 15 days.   Vitamin D 2000 units Caps Take 2,000 Units by mouth daily.   warfarin 2 MG tablet Commonly known as:  COUMADIN TAKE AS DIRECTED BY ANTICOAGULATION CLINIC. What changed:  See the new instructions.      Follow-up Information    Ovidio Kin, MD. Call.   Specialty:  General Surgery Contact information: [address] [place] [address] [phone]          Allergies  Allergen Reactions  . Ace Inhibitors Other (See Comments)    Cough  . Fosamax [Alendronate Sodium] Other (See Comments)    Heart burn  . Norvasc [Amlodipine Besylate] Other (See Comments)    edema  . Zocor [Simvastatin] Other (See Comments)    Elevated CPK    Consultations:  Surgery   GI    Procedures/Studies: Dg Abd 1  View  Result Date: 11/30/2017 CLINICAL DATA:  Nausea, vomiting. EXAM: ABDOMEN - 1 VIEW COMPARISON:  Radiographs of November 25, 2017. FINDINGS: The bowel gas pattern is normal. Status post cholecystectomy. Phleboliths are noted in the pelvis. IMPRESSION: No evidence of bowel obstruction or ileus. Electronically Signed   By: Lupita Raider, M.D.   On: 11/30/2017 09:49   Ct Abdomen Pelvis W Contrast  Result Date: 11/24/2017 CLINICAL DATA:  Epigastric pain after dinner. EXAM: CT ABDOMEN AND PELVIS WITH CONTRAST TECHNIQUE: Multidetector CT imaging of the abdomen and pelvis was performed using the standard protocol following bolus administration of intravenous contrast. CONTRAST:  OMNIPAQUE IOHEXOL 300 MG/ML  SOLN COMPARISON:  CT 06/08/2014, CXR 04/02/2015 FINDINGS: Lower chest: Redemonstration of large hiatal hernia containing much of the stomach. Heart size is borderline enlarged but stable. There is atelectasis at each base. Hepatobiliary: Hepatic steatosis. Cholecystectomy. No space-occupying mass of the liver. Biliary dilatation compatible with reservoir effect status post cholecystectomy. No choledocholithiasis. Pancreas: Pancreatic atrophy. Ectasia of the pancreatic duct without pancreatic mass or inflammation. Spleen: Normal Adrenals/Urinary  Tract: Normal bilateral adrenal glands. Symmetric cortical enhancement of both kidneys with mild cortical scarring along lateral aspect of both kidneys. No nephrolithiasis nor hydroureteronephrosis. The urinary bladder is unremarkable for the degree of distention. Stomach/Bowel: Normal small bowel rotation without obstruction. Moderate stool retention within the colon with scattered colonic diverticulosis. No acute diverticulitis. Appendix not well visualized but no pericecal inflammatory change noted. Vascular/Lymphatic: Mild mild aortoiliac and branch vessel atherosclerosis without aneurysm. No adenopathy. Reproductive: Calcified uterine fibroids.  No adnexal mass.  Other: No free air or free fluid. Musculoskeletal: No worrisome lytic or sclerotic lesions. Degenerative change noted of the dorsal spine SI joints and pubic symphysis. IMPRESSION: 1. Repeat demonstration of large hiatal hernia. No acute bowel obstruction or inflammation. Scattered colonic diverticulosis without acute diverticulitis. 2. Mild hepatic steatosis. Reservoir effect status post cholecystectomy accounting for intra and extrahepatic ductal dilatation. 3. Calcified uterine fibroids.  Adnexal masses Electronically Signed   By: Tollie Eth M.D.   On: 11/24/2017 21:35   Dg Chest Port 1 View  Result Date: 12/02/2017 CLINICAL DATA:  Status post peripherally inserted central catheter (PICC) central line placement EXAM: PORTABLE CHEST 1 VIEW COMPARISON:  Chest x-ray dated 11/25/2017. FINDINGS: RIGHT-sided PICC line appears well positioned with tip at the expected level of the lower SVC/cavoatrial junction. Stable cardiomegaly. Probable bibasilar atelectasis and/or small bilateral pleural effusions. No pneumothorax seen. Stable chronic elevation of the LEFT hemidiaphragm. IMPRESSION: 1. PICC line appears appropriately positioned with tip at the expected level of the lower SVC/cavoatrial junction. 2. Probable bibasilar atelectasis and/or small pleural effusions. 3. Stable cardiomegaly. Electronically Signed   By: Bary Richard M.D.   On: 12/02/2017 15:01   Dg Abd Acute W/chest  Result Date: 11/25/2017 CLINICAL DATA:  84 y/o  F; upper abdominal pain, nausea, vomiting. EXAM: DG ABDOMEN ACUTE W/ 1V CHEST COMPARISON:  11/24/2017 CT of abdomen and pelvis. FINDINGS: Large hiatal hernia obscuring the left lung base. Visualized lung fields are clear. Aortic atherosclerosis. Cardiac silhouette obscured by left lung base hiatal hernia. Right upper quadrant surgical clips. Normal bowel gas pattern. Multilevel degenerative changes of the spine, no acute osseous abnormality is evident. Calcified uterine myoma. IMPRESSION:  Large hiatal hernia obscuring left lung base. No acute pulmonary process identified. Normal abdominal and pelvic bowel gas pattern. Electronically Signed   By: Mitzi Hansen M.D.   On: 11/25/2017 20:48   [place] Ekg Site Rite  Result Date: 12/02/2017 If Site Rite image not attached, placement could not be confirmed due to current cardiac rhythm.      Subjective: Patient had episode of reflux this am, with vomiting, no chest pain or dyspnea.   Discharge Exam: Vitals:   12/12/17 2116 12/13/17 0351  BP: (!) 188/80 (!) 154/85  Pulse: 79 72  Resp: 16 16  Temp: 98.1 F (36.7 C) 98.1 F (36.7 C)  SpO2: 97% 96%   Vitals:   12/12/17 1508 12/12/17 2116 12/13/17 0351 12/13/17 0500  BP: (!) 150/96 (!) 188/80 (!) 154/85   Pulse: 72 79 72   Resp: 18 16 16    Temp: 97.7 F (36.5 C) 98.1 F (36.7 C) 98.1 F (36.7 C)   TempSrc: Oral Oral Oral   SpO2: 99% 97% 96%   Weight:    54.9 kg (121 lb)  Height:        General: Not in pain or dyspnea, dconditioned  Neurology: Awake and alert, non focal  E ENT: mild pallor, no icterus, oral mucosa moist Cardiovascular: No JVD. S1-S2 present, rhythmic,  no gallops, rubs, or murmurs. No lower extremity edema. Pulmonary: vesicular breath sounds bilaterally, adequate air movement, no wheezing, rhonchi or rales. Gastrointestinal. Abdomen with no organomegaly, non tender, no rebound or guarding Skin. No rashes Musculoskeletal: no joint deformities   The results of significant diagnostics from this hospitalization (including imaging, microbiology, ancillary and laboratory) are listed below for reference.     Microbiology: No results found for this or any previous visit (from the past 240 hour(s)).   Labs: BNP (last 3 results) No results for input(s): BNP in the last 8760 hours. Basic Metabolic Panel: Recent Labs  Lab 12/07/17 0600 12/08/17 0316 12/09/17 0409 12/10/17 0406  NA 138 134* 140 139  K 4.1 4.1 4.2 4.1  CL 104 102 107 104   CO2 26 25 26 26   GLUCOSE 157* 150* 120* 124*  BUN 18 17 15 14   CREATININE 0.64 0.70 0.71 0.73  CALCIUM 8.7* 9.0 9.1 9.2   Liver Function Tests: No results for input(s): AST, ALT, ALKPHOS, BILITOT, PROT, ALBUMIN in the last 168 hours. No results for input(s): LIPASE, AMYLASE in the last 168 hours. No results for input(s): AMMONIA in the last 168 hours. CBC: Recent Labs  Lab 12/09/17 0409 12/10/17 0406 12/11/17 0430 12/12/17 0429 12/13/17 0516  WBC 8.3 7.4 7.7 6.7 7.1  HGB 11.7* 12.0 12.0 12.1 12.3  HCT 36.4 37.4 37.5 37.7 38.0  MCV 97.6 95.9 96.4 96.9 95.5  PLT 218 196 162 151 154   Cardiac Enzymes: No results for input(s): CKTOTAL, CKMB, CKMBINDEX, TROPONINI in the last 168 hours. BNP: Invalid input(s): POCBNP CBG: Recent Labs  Lab 12/12/17 0511 12/12/17 1211 12/12/17 1917 12/13/17 0025 12/13/17 0553  GLUCAP 125* 104* 119* 103* 107*   D-Dimer No results for input(s): DDIMER in the last 72 hours. Hgb A1c No results for input(s): HGBA1C in the last 72 hours. Lipid Profile No results for input(s): CHOL, HDL, LDLCALC, TRIG, CHOLHDL, LDLDIRECT in the last 72 hours. Thyroid function studies No results for input(s): TSH, T4TOTAL, T3FREE, THYROIDAB in the last 72 hours.  Invalid input(s): FREET3 Anemia work up No results for input(s): VITAMINB12, FOLATE, FERRITIN, TIBC, IRON, RETICCTPCT in the last 72 hours. Urinalysis    Component Value Date/Time   COLORURINE YELLOW 11/29/2017 2240   APPEARANCEUR HAZY (A) 11/29/2017 2240   LABSPEC 1.017 11/29/2017 2240   PHURINE 5.0 11/29/2017 2240   GLUCOSEU 50 (A) 11/29/2017 2240   HGBUR MODERATE (A) 11/29/2017 2240   BILIRUBINUR NEGATIVE 11/29/2017 2240   KETONESUR 20 (A) 11/29/2017 2240   PROTEINUR 30 (A) 11/29/2017 2240   UROBILINOGEN 1.0 04/02/2015 1244   NITRITE NEGATIVE 11/29/2017 2240   LEUKOCYTESUR NEGATIVE 11/29/2017 2240   Sepsis Labs Invalid input(s): PROCALCITONIN,  WBC,  LACTICIDVEN Microbiology No  results found for this or any previous visit (from the past 240 hour(s)).   Time coordinating discharge: 45 minutes  SIGNED:   Coralie Keens, MD  [facility] 12/13/2017, 11:40 AM Pager [phone]  If 7PM-7AM, please contact night-coverage [email or web address] Password [email or web address]

## 2017-12-13 NOTE — Progress Notes (Signed)
ANTICOAGULATION CONSULT NOTE - Follow Up Consult  Pharmacy Consult:  Heparin >> Lovenox (see addendum) Indication: atrial fibrillation and pulmonary embolus  Allergies  Allergen Reactions  . Ace Inhibitors Other (See Comments)    Cough  . Fosamax [Alendronate Sodium] Other (See Comments)    Heart burn  . Norvasc [Amlodipine Besylate] Other (See Comments)    edema  . Zocor [Simvastatin] Other (See Comments)    Elevated CPK    Patient Measurements: Height: 5\' 5"  (165.1 cm) Weight: 121 lb (54.9 kg) IBW/kg (Calculated) : 57  Vital Signs: Temp: 98.1 F (36.7 C) (07/18 0351) Temp Source: Oral (07/18 0351) BP: 154/85 (07/18 0351) Pulse Rate: 72 (07/18 0351)  Labs: Recent Labs    12/10/17 2040  12/11/17 0430 12/12/17 0429 12/13/17 0516  HGB  --    < > 12.0 12.1 12.3  HCT  --   --  37.5 37.7 38.0  PLT  --   --  162 151 154  LABPROT  --   --  13.6 14.9 16.0*  INR  --   --  1.05 1.18 1.29  HEPARINUNFRC 0.43  --  0.31 0.36  --    < > = values in this interval not displayed.    Estimated Creatinine Clearance: 40.5 mL/min (by C-G formula based on SCr of 0.73 mg/dL).   Assessment: [age] YOF with history of afib on warfarin PTA - currently on hold as patient with N/V and recent coffee ground emesis with no bleeding noted. GI ok with heparin, warfarin resumed 7/14. PTA warfarin dose 2 mg daily EXCEPT for 1 mg on TTS.  IV heparin for switched to Lovenox yesterday to help prepare for discharge. H/H and Plt remain stable today. Patient was reweighed and his new weight is down to 55 kg.   INR now beginning to trend up. Currently 1.29 after 4 doses of warfarin   Goal of Therapy:  Stroke prevention Monitor platelets by anticoagulation protocol: Yes  Plan:  - Decrease Lovenox to 55 mg SQ twice daily  - Continue warfarin 3 mg daily. If planning discharge, consider f/u INR outpatient on Monday.  - D/c Lovenox when INR > 2.   Thank you for allowing pharmacy to be a part of this  patient's care.  Vinnie LevelBenjamin Kischa Altice, PharmD., BCPS Clinical Pharmacist Clinical phone for 12/13/17 until 3:30pm: [phone] If after 3:30pm, please [facility] to [facility] for unit-specific pharmacist

## 2017-12-13 NOTE — Progress Notes (Signed)
Physical Therapy Treatment Patient Details Name: Lisa Hopkins MRN: [id number] DOB: [birth year] Today's Date: 12/13/2017    History of Present Illness Pt is a 84 y/o female with a PMH significant for HTN, history of diabetes now diet controlled, CKDII, CVA, dementia, and a-fib on warfarin, now presenting to the emergency department for evaluation of upper abdominal pain, nausea, and nonbloody vomiting.  Patient was admitted to the hospital from 11/25/2017 until 11/29/2017 for the same symptoms, underwent CT of the abdomen/pelvis and EGD, and she was found to have a large hiatal hernia. She was discharged in much improved condition, and was planning to follow-up with surgery as an outpatient for possible surgical treatment.  Unfortunately, soon after leaving the hospital, her pain became severe again and she had recurrence and nausea with nonbloody vomiting.     PT Comments    Pt progressing with therapy, increasing ambulation distance this session. Pt denies pain but gestures towards her abdomen at times as if she is in pain. Ambulating with RW, no overt LOB. Mod I with bed mobility and standing. Discussed with patients son level of supervision, recommending for OOB mobility until she works with HHPT and returns closer to baseline. VSS on RA  Follow Up Recommendations  [facility] Supervision for OOB mobility.      Equipment Recommendations  Rolling walker with 5" wheels    Recommendations for Other Services OT consult     Precautions / Restrictions Precautions Precautions: Fall Restrictions Weight Bearing Restrictions: No    Mobility  Bed Mobility Overal bed mobility: Modified Independent                Transfers Overall transfer level: Needs assistance Equipment used: Rolling walker (2 wheeled) Transfers: Sit to/from Stand Sit to Stand: Supervision            Ambulation/Gait Ambulation/Gait assistance: Min guard Gait Distance (Feet): 200  Feet Assistive device: Rolling walker (2 wheeled) Gait Pattern/deviations: Step-through pattern;Decreased stride length;Trunk flexed;Narrow base of support Gait velocity: Decreased   General Gait Details: pt ambulating with RW, no overt LOB but mild unsteadiness noted.    Stairs             Wheelchair Mobility    Modified Rankin (Stroke Patients Only)       Balance Overall balance assessment: Needs assistance   Sitting balance-Leahy Scale: Good       Standing balance-Leahy Scale: Fair Standing balance comment: statically during ADL                            Cognition Arousal/Alertness: Awake/alert Behavior During Therapy: Flat affect Overall Cognitive Status: History of cognitive impairments - at baseline                                 General Comments: pt stating she is getting ready to turn 89, baseline dementia      Exercises      General Comments        Pertinent Vitals/Pain Pain Assessment: No/denies pain    Home Living                      Prior Function            PT Goals (current goals can now be found in the care plan section) Acute Rehab PT Goals Patient Stated Goal: to  go home PT Goal Formulation: With patient/family Time For Goal Achievement: 12/17/17 Potential to Achieve Goals: Good Progress towards PT goals: Progressing toward goals    Frequency    Min 3X/week      PT Plan Current plan remains appropriate    Co-evaluation              AM-PAC PT "6 Clicks" Daily Activity  Outcome Measure  Difficulty turning over in bed (including adjusting bedclothes, sheets and blankets)?: A Little Difficulty moving from lying on back to sitting on the side of the bed? : A Little Difficulty sitting down on and standing up from a chair with arms (e.g., wheelchair, bedside commode, etc,.)?: A Little Help needed moving to and from a bed to chair (including a wheelchair)?: A Little Help needed  walking in hospital room?: A Little Help needed climbing 3-5 steps with a railing? : A Little 6 Click Score: 18    End of Session Equipment Utilized During Treatment: Gait belt Activity Tolerance: Patient tolerated treatment well Patient left: in chair;with call bell/phone within reach;with chair alarm set Nurse Communication: Mobility status;Precautions PT Visit Diagnosis: Unsteadiness on feet (R26.81);Other abnormalities of gait and mobility (R26.89)     Time: 4098-1191 PT Time Calculation (min) (ACUTE ONLY): 17 min  Charges:  $Gait Training: 8-22 mins                    G Codes:      Etta Grandchild, PT, DPT Acute Rehab Services Pager: [phone]     Etta Grandchild 12/13/2017, 10:55 AM

## 2017-12-14 DIAGNOSIS — E1122 Type 2 diabetes mellitus with diabetic chronic kidney disease: Secondary | ICD-10-CM | POA: Diagnosis not present

## 2017-12-14 DIAGNOSIS — I482 Chronic atrial fibrillation: Secondary | ICD-10-CM | POA: Diagnosis not present

## 2017-12-14 DIAGNOSIS — I130 Hypertensive heart and chronic kidney disease with heart failure and stage 1 through stage 4 chronic kidney disease, or unspecified chronic kidney disease: Secondary | ICD-10-CM | POA: Diagnosis not present

## 2017-12-14 DIAGNOSIS — K449 Diaphragmatic hernia without obstruction or gangrene: Secondary | ICD-10-CM | POA: Diagnosis not present

## 2017-12-14 DIAGNOSIS — N182 Chronic kidney disease, stage 2 (mild): Secondary | ICD-10-CM | POA: Diagnosis not present

## 2017-12-14 DIAGNOSIS — I5030 Unspecified diastolic (congestive) heart failure: Secondary | ICD-10-CM | POA: Diagnosis not present

## 2017-12-17 ENCOUNTER — Telehealth: Payer: Self-pay | Admitting: *Deleted

## 2017-12-17 NOTE — Telephone Encounter (Signed)
Pt sons called to schedule  Called patient on 12/17/2017 , 10:23 AM in an attempt to reach the patient for a hospital follow up.   Admit date: 11/29/17 Discharge: 12/13/17   She does not  have any questions or concerns about medications from the hospital admission. The patient's medications were reviewed over the phone, they were counseled to bring in all current medications to the hospital follow up visit.   I advised the patient to call if any questions or concerns arise about the hospital admission or medications    Home health was not  started in the hospital.  All questions were answered and a follow up appointment was made.   Prior to Admission medications   Medication Sig Start Date End Date Taking? Authorizing Provider  Cholecalciferol (VITAMIN D) 2000 UNITS CAPS Take 2,000 Units by mouth daily.     [provider]  CINNAMON PO Take 1 capsule by mouth daily.    [provider]  dicyclomine (BENTYL) 20 MG tablet Take 1 tablet 3 x day if needed for nausea, bloating , cramping or diarrhea 06/24/15   Lucky CowboyMcKeown, William, MD  enoxaparin (LOVENOX) 60 MG/0.6ML injection Inject 0.55 mLs (55 mg total) into the skin every 12 (twelve) hours for 7 days. 12/13/17 12/20/17  Arrien, York RamMauricio Daniel, MD  losartan (COZAAR) 50 MG tablet TAKE 1 TABLET EVERY MORNING, CHECK BP AT LUNCH-IF BP ABOVE 140/90 TAKE ANOTHER TABLET. 11/14/17   Judd Gaudierorbett, Ashley, NP  Magnesium Hydroxide (MAGNESIA PO) Take 1 tablet by mouth daily.     [provider]  metoprolol tartrate (LOPRESSOR) 25 MG tablet Take 0.5 tablets (12.5 mg total) by mouth 2 (two) times daily. 07/16/17 11/30/17  Lucky CowboyMcKeown, William, MD  ondansetron (ZOFRAN ODT) 4 MG disintegrating tablet Take 1 tablet (4 mg total) by mouth every 8 (eight) hours as needed for nausea or vomiting. 11/29/17   Rai, Ripudeep K, MD  pantoprazole (PROTONIX) 40 MG tablet Take 1 tablet (40 mg total) by mouth 2 (two) times daily. 12/13/17 01/12/18  Arrien, York RamMauricio Daniel, MD   QUEtiapine (SEROQUEL) 25 MG tablet Take 1 tablet at bedtime Patient taking differently: Take 12.5 mg by mouth at bedtime.  10/03/17 09/24/18  Lucky CowboyMcKeown, William, MD  sucralfate (CARAFATE) 1 g tablet Take 1 tablet (1 g total) by mouth 4 (four) times daily for 15 days. 12/13/17 12/28/17  Arrien, York RamMauricio Daniel, MD  warfarin (COUMADIN) 2 MG tablet TAKE AS DIRECTED BY ANTICOAGULATION CLINIC. Patient taking differently: 1 mg on Tues, Thurs, Sat, 2 mg on all other days 09/10/17   Lars MassonNelson, Katarina H, MD

## 2017-12-18 ENCOUNTER — Ambulatory Visit: Payer: Medicare Other

## 2017-12-18 DIAGNOSIS — Z8673 Personal history of transient ischemic attack (TIA), and cerebral infarction without residual deficits: Secondary | ICD-10-CM

## 2017-12-18 DIAGNOSIS — I4891 Unspecified atrial fibrillation: Secondary | ICD-10-CM

## 2017-12-18 DIAGNOSIS — Z79899 Other long term (current) drug therapy: Secondary | ICD-10-CM

## 2017-12-18 LAB — POCT INR: INR: 1.5 — AB (ref 2.0–3.0)

## 2017-12-18 NOTE — Patient Instructions (Signed)
Description   Take 1 extra tablet today then take 1.5 tablets tomorrow, then resume same dosage 1 tablet everyday except 1/2 tablet on Tuesdays, Thursdays and Saturdays.  Recheck INR on Friday.  Continue the Lovenox injections every 12 hours.  Call us at [facility] # [phone], Main # [phone].

## 2017-12-19 DIAGNOSIS — K449 Diaphragmatic hernia without obstruction or gangrene: Secondary | ICD-10-CM | POA: Insufficient documentation

## 2017-12-19 NOTE — Progress Notes (Signed)
Hospital follow up  Assessment and Plan: Hospital visit follow up for: symptomatic hiatal hernia Hospital discharge meds were reviewed, and reconciled with the patient.   Intractable vomiting with nausea, unspecified vomiting type Check CBC, BMP/GFR, magnesium, resolved  Hiatal hernia Poor surgical candidate; symptoms improved with soft diet  Atrial fibrillation, unspecified type (HCC) Has followed up with coumadin clinic for management Discussed if patient falls to immediately contact office or go to ER. Discussed foods that can increase or decrease Coumadin levels. Patient understands to call the office before starting a new medication. Follow up in one month.   Htn/ diastolic CHF Presents with some pedal edema; check BMP, plan on adding back lasix 20 mg and potassium, cut losartan dose in 1/2, monitor BP daily, stop losartan if consistently running <110/60, follow up as scheduled or sooner if any concerns Check BMP/GFR  Over 40 minutes of exam, counseling, chart review, and complex, high/moderate level critical decision making was performed this visit.   Future Appointments  Date Time Provider Department Center  12/21/2017 10:30 AM [facility]  02/11/2018  2:30 PM Judd Gaudier, NP [facility] None  05/27/2018  9:00 AM Lucky Cowboy, MD [facility] None     HPI 84 y.o.female presents accompanied by her 2 sons for follow up for transition from recent hospitalization or [facility]IF stay. Admit date to the hospital was 11/29/17, patient was discharged from the hospital on 12/13/17 and our clinical staff contacted the office the day after discharge to set up a follow up appointment. The discharge summary, medications, and diagnostic test results were reviewed before meeting with the patient. The patient was admitted for: intractable nausea and vomiting secondary to symptomatic hiatal hernia.   The patient presented to the ED on 7/4 with abdominal pain,  nausea and vomiting and bloody diarrhea. She was noted to have recent diagnosis of a large hiatal hernia (known by previous imaging, recently had been c/o dysphagia and barium swallow had been ordered at this office, and was referred for further GI workup).Initial physical examination blood pressure 195/115, heart rate 94, respiratory rate16, temperature 97.6 . She was ill lappearing, but otherwise stable at that time.  Sodium 136, potassium 3.3, chloride 95, bicarb 24, glucose 149, BUN 14, creatinine 0.94, white count 9.1, hemoglobin 16.6, hematocrit 50.1, platelets 236.  Urinalysis specific gravity 1.017, 30 protein, white cells 0-5.  Chest x-ray with large hiatal hernia.  EKG with atrial fibrillation with normal axis. She was admitted to the hospital with the working diagnosis of intractable abdominal pain due to large hiatal hernia.   Patient had recent thorough gastrointestinal evaluation with a CT of the abdomen, pelvis and upper endoscopy.  She was placed on IV fluids, IV antiacids, and as needed IV antiemetics. Patient was seen by general surgery and she was found not a good candidate for surgical intervention.  Patient was placed on parenteral nutrition.  She had slow improvement of her symptoms, diet was progressively advanced and total parenteral nutrition was discontinued.  Patient will be discharge home with twice daily pantoprazole, 4 times daily sucralfate and further reflux precautions including head elevated of the bed at all times and to stay seated after meals.   Patient had an embolic CVA in 2011 and has been on Coumadin since for a. Fib; while hospitalized the patient remained rate controlled, her INR was supratherapeutic, patient was placed on enoxaparin for bridge anticoagulation and was discharged with INR 1.2; she has followed up with coumadin clinic on 7/23 with  INR 1.5 with consequent dose adjustment and has next follow up tomorrow.   She has hx of htn and diastolic CHF which  remained stable while hospitalized; it appears lasix/potassium was d/c'd while hospitalized, but she presents with pedal edema today, and reports exertional fatigue/dyspnea.   Wt Readings from Last 3 Encounters:  12/20/17 115 lb (52.2 kg)  12/13/17 121 lb (54.9 kg)  11/28/17 115 lb (52.2 kg)    Patient had severe deconditioning related to her poor oral intake, and persistent reflux.  Patient was seen by physical therapy, home health has been arranged.  Patient was seen by nutritionist, nutritional supplements were started.    BP 110/68   Pulse 72   Temp (!) 97.5 F (36.4 C)   Ht 5' 1.5" (1.562 m)   Wt 115 lb (52.2 kg)   SpO2 97%   BMI 21.38 kg/m    Images while in the hospital: Dg Abd 1 View  Result Date: 11/30/2017 CLINICAL DATA:  Nausea, vomiting. EXAM: ABDOMEN - 1 VIEW COMPARISON:  Radiographs of November 25, 2017. FINDINGS: The bowel gas pattern is normal. Status post cholecystectomy. Phleboliths are noted in the pelvis. IMPRESSION: No evidence of bowel obstruction or ileus. Electronically Signed   By: Lupita Raider, M.D.   On: 11/30/2017 09:49    Past Medical History:  Diagnosis Date  . Anxiety   . Aphasia as late effect of cerebrovascular accident   . Atrial fibrillation (HCC)   . CKD (chronic kidney disease), stage II   . CVA (cerebral infarction)   . Diabetes mellitus type II   . GERD (gastroesophageal reflux disease)   . Hiatal hernia   . HTN (hypertension)   . Hyperlipidemia   . IBS (irritable bowel syndrome)   . Stroke (HCC)   . Vitamin D deficiency      Allergies  Allergen Reactions  . Ace Inhibitors Other (See Comments)    Cough  . Fosamax [Alendronate Sodium] Other (See Comments)    Heart burn  . Norvasc [Amlodipine Besylate] Other (See Comments)    edema  . Zocor [Simvastatin] Other (See Comments)    Elevated CPK      Current Outpatient Medications on File Prior to Visit  Medication Sig Dispense Refill  . Cholecalciferol (VITAMIN D) 2000 UNITS  CAPS Take 2,000 Units by mouth daily.     Marland Kitchen CINNAMON PO Take 1 capsule by mouth daily.    Marland Kitchen dicyclomine (BENTYL) 20 MG tablet Take 1 tablet 3 x day if needed for nausea, bloating , cramping or diarrhea 90 tablet 1  . enoxaparin (LOVENOX) 60 MG/0.6ML injection Inject 0.55 mLs (55 mg total) into the skin every 12 (twelve) hours for 7 days. 7.7 mL 0  . furosemide (LASIX) 20 MG tablet Take 20 mg by mouth daily.    . Magnesium Hydroxide (MAGNESIA PO) Take 1 tablet by mouth daily.     . metoprolol tartrate (LOPRESSOR) 25 MG tablet Take 0.5 tablets (12.5 mg total) by mouth 2 (two) times daily. 180 tablet 3  . ondansetron (ZOFRAN ODT) 4 MG disintegrating tablet Take 1 tablet (4 mg total) by mouth every 8 (eight) hours as needed for nausea or vomiting. 20 tablet 0  . pantoprazole (PROTONIX) 40 MG tablet Take 1 tablet (40 mg total) by mouth 2 (two) times daily. 60 tablet 0  . QUEtiapine (SEROQUEL) 25 MG tablet Take 1 tablet at bedtime (Patient taking differently: Take 12.5 mg by mouth at bedtime. ) 90 tablet 1  .  sucralfate (CARAFATE) 1 g tablet Take 1 tablet (1 g total) by mouth 4 (four) times daily for 15 days. 60 tablet 0  . warfarin (COUMADIN) 2 MG tablet TAKE AS DIRECTED BY ANTICOAGULATION CLINIC. (Patient taking differently: 1 mg on Tues, Thurs, Sat, 2 mg on all other days) 30 tablet 2   No current facility-administered medications on file prior to visit.     ROS: all negative except above.   Physical Exam: Filed Weights   12/20/17 1406  Weight: 115 lb (52.2 kg)   BP 110/68   Pulse 72   Temp (!) 97.5 F (36.4 C)   Ht 5' 1.5" (1.562 m)   Wt 115 lb (52.2 kg)   SpO2 97%   BMI 21.38 kg/m  General Appearance: Frail elder, in no apparent distress. Eyes: PERRLA, EOMs, conjunctiva no swelling or erythema ENT/Mouth: Ext aud canals clear, TMs without erythema, bulging. No erythema, swelling, or exudate on post pharynx.  Tonsils not swollen or erythematous. Hearing normal.  Neck: Supple, thyroid  normal.  Respiratory: Respiratory effort normal, BS equal bilaterally without rales, rhonchi, wheezing or stridor.  Cardio: RRR with no MRGs. Brisk peripheral pulses with bilateral pedal edema Abdomen: Soft, + BS.  Non tender, no guarding, rebound, hernias, masses. Lymphatics: Non tender without lymphadenopathy.  Musculoskeletal: Full ROM, symmetrical strength, weak throughout, slow gait.  Skin: Warm, dry without rashes, lesions, ecchymosis.  Neuro: Cranial nerves intact. Normal muscle tone, no cerebellar symptoms. Sensation intact.  Psych: Awake and oriented X 3, somewhat mentally dulled, normal affect, Insight and Judgment fair.    Dan MakerAshley C Malikye Reppond, NP 3:23 PM [facility]

## 2017-12-20 ENCOUNTER — Ambulatory Visit: Payer: Self-pay | Admitting: Adult Health

## 2017-12-20 ENCOUNTER — Ambulatory Visit (INDEPENDENT_AMBULATORY_CARE_PROVIDER_SITE_OTHER): Payer: Medicare Other | Admitting: Adult Health

## 2017-12-20 ENCOUNTER — Encounter: Payer: Self-pay | Admitting: Adult Health

## 2017-12-20 VITALS — BP 110/68 | HR 72 | Temp 97.5°F | Ht 61.5 in | Wt 115.0 lb

## 2017-12-20 DIAGNOSIS — I10 Essential (primary) hypertension: Secondary | ICD-10-CM | POA: Diagnosis not present

## 2017-12-20 DIAGNOSIS — I4891 Unspecified atrial fibrillation: Secondary | ICD-10-CM | POA: Diagnosis not present

## 2017-12-20 DIAGNOSIS — I5030 Unspecified diastolic (congestive) heart failure: Secondary | ICD-10-CM

## 2017-12-20 DIAGNOSIS — R112 Nausea with vomiting, unspecified: Secondary | ICD-10-CM

## 2017-12-20 DIAGNOSIS — K449 Diaphragmatic hernia without obstruction or gangrene: Secondary | ICD-10-CM | POA: Diagnosis not present

## 2017-12-20 DIAGNOSIS — I5032 Chronic diastolic (congestive) heart failure: Secondary | ICD-10-CM

## 2017-12-20 LAB — CBC WITH DIFFERENTIAL/PLATELET
BASOS PCT: 0.7 %
Basophils Absolute: 40 cells/uL (ref 0–200)
EOS ABS: 359 {cells}/uL (ref 15–500)
Eosinophils Relative: 6.3 %
HEMATOCRIT: 40.7 % (ref 35.0–45.0)
HEMOGLOBIN: 13.5 g/dL (ref 11.7–15.5)
Lymphs Abs: 2497 cells/uL (ref 850–3900)
MCH: 30.8 pg (ref 27.0–33.0)
MCHC: 33.2 g/dL (ref 32.0–36.0)
MCV: 92.7 fL (ref 80.0–100.0)
MPV: 10.9 fL (ref 7.5–12.5)
Monocytes Relative: 8.2 %
Neutro Abs: 2337 cells/uL (ref 1500–7800)
Neutrophils Relative %: 41 %
PLATELETS: 238 10*3/uL (ref 140–400)
RBC: 4.39 10*6/uL (ref 3.80–5.10)
RDW: 12.4 % (ref 11.0–15.0)
TOTAL LYMPHOCYTE: 43.8 %
WBC mixed population: 467 cells/uL (ref 200–950)
WBC: 5.7 10*3/uL (ref 3.8–10.8)

## 2017-12-20 LAB — MAGNESIUM: Magnesium: 2 mg/dL (ref 1.5–2.5)

## 2017-12-20 LAB — BASIC METABOLIC PANEL WITH GFR
BUN: 15 mg/dL (ref 7–25)
CO2: 29 mmol/L (ref 20–32)
CREATININE: 0.77 mg/dL (ref 0.60–0.88)
Calcium: 9.9 mg/dL (ref 8.6–10.4)
Chloride: 102 mmol/L (ref 98–110)
GFR, EST AFRICAN AMERICAN: 79 mL/min/{1.73_m2} (ref >=60)
GFR, Est Non African American: 68 mL/min/{1.73_m2} (ref >=60)
Glucose, Bld: 116 mg/dL — ABNORMAL HIGH (ref 65–99)
Potassium: 5.1 mmol/L (ref 3.5–5.3)
Sodium: 138 mmol/L (ref 135–146)

## 2017-12-20 MED ORDER — POTASSIUM CHLORIDE ER 10 MEQ PO TBCR: 10.0000 meq | EXTENDED_RELEASE_TABLET | Freq: Every day | ORAL | 3 refills | Status: DC

## 2017-12-20 MED ORDER — LOSARTAN POTASSIUM 50 MG PO TABS: ORAL_TABLET | ORAL | 0 refills | Status: DC

## 2017-12-20 NOTE — Patient Instructions (Signed)
Please add lasix 20 mg daily, cut losartan in 1/2, and may stop entirely if blood pressure is running consistently low <110/60    Please monitor your blood pressure, as we get older our body can not respond to a low blood pressure as well as it did when we were younger, for this reason we want a bit higher of a blood pressure as you get older to avoid dizziness and fatigue which can lead to falls. [place]ase call if your blood pressure is consistently above 150/90.   Please monitor your blood pressure. If it is getting below 130/80 AND you are having fatigue with exertion, dizziness we may need to cut your blood pressure medication in half. Please call the office if this is happening. Hypotension As your heart beats, it forces blood through your body. This force is called blood pressure. If you have hypotension, you have low blood pressure. When your blood pressure is too low, you may not get enough blood to your brain. You may feel weak, feel lightheaded, have a fast heartbeat, or even pass out (faint). HOME CARE  Drink enough fluids to keep your pee (urine) clear or pale yellow.  Take all medicines as told by your doctor.  Get up slowly after sitting or lying down.  Wear support stockings as told by your doctor.  Maintain a healthy diet by including foods such as fruits, vegetables, nuts, whole grains, and lean meats. GET HELP IF:  You are throwing up (vomiting) or have watery poop (diarrhea).  You have a fever for more than 2-3 days.  You feel more thirsty than usual.  You feel weak and tired. GET HELP RIGHT AWAY IF:   You pass out (faint).  You have chest pain or a fast or irregular heartbeat.  You lose feeling in part of your body.  You cannot move your arms or legs.  You have trouble speaking.  You get sweaty or feel lightheaded. MAKE SURE YOU:   Understand these instructions.  Will watch your condition.  Will get help right away if you are not doing well or get  worse. Document Released: 08/09/2009 Document Revised: 01/15/2013 Document Reviewed: 11/15/2012 [facility] Patient Information 2015 [employer] [place]. This information is not intended to replace advice given to you by your health care provider. Make sure you discuss any questions you have with your health care provider.

## 2017-12-21 ENCOUNTER — Ambulatory Visit (INDEPENDENT_AMBULATORY_CARE_PROVIDER_SITE_OTHER): Payer: Medicare Other | Admitting: Internal Medicine

## 2017-12-21 DIAGNOSIS — I4891 Unspecified atrial fibrillation: Secondary | ICD-10-CM | POA: Diagnosis not present

## 2017-12-21 DIAGNOSIS — Z8673 Personal history of transient ischemic attack (TIA), and cerebral infarction without residual deficits: Secondary | ICD-10-CM

## 2017-12-21 DIAGNOSIS — Z5181 Encounter for therapeutic drug level monitoring: Secondary | ICD-10-CM | POA: Diagnosis not present

## 2017-12-21 DIAGNOSIS — Z0001 Encounter for general adult medical examination with abnormal findings: Secondary | ICD-10-CM | POA: Insufficient documentation

## 2017-12-21 LAB — POCT INR: INR: 2 (ref 2.0–3.0)

## 2017-12-24 ENCOUNTER — Telehealth: Payer: Self-pay | Admitting: *Deleted

## 2017-12-24 NOTE — Telephone Encounter (Signed)
The patient's son called and asked if it is necessary for the patient to schedule an appointment with Dr Ovidio Kinavid Newman regarding hernia repair.  The patient decided not to have the surgery to repair the hernia. Per Judd GaudierAshley Corbett, NP, the son was advised it is OK to cancel, but suggested she may want to see her GI doctor.

## 2017-12-25 ENCOUNTER — Ambulatory Visit (INDEPENDENT_AMBULATORY_CARE_PROVIDER_SITE_OTHER): Payer: Medicare Other

## 2017-12-25 DIAGNOSIS — Z5181 Encounter for therapeutic drug level monitoring: Secondary | ICD-10-CM | POA: Diagnosis not present

## 2017-12-25 DIAGNOSIS — I4891 Unspecified atrial fibrillation: Secondary | ICD-10-CM | POA: Diagnosis not present

## 2017-12-25 DIAGNOSIS — Z8673 Personal history of transient ischemic attack (TIA), and cerebral infarction without residual deficits: Secondary | ICD-10-CM

## 2017-12-25 LAB — POCT INR: INR: 1.9 — AB (ref 2.0–3.0)

## 2017-12-25 NOTE — Patient Instructions (Signed)
Description   Spoke with Alfonso PattenGenevive RN with [facility] instructed pt to stop Lovenox injections and continue taking 1 tablet everyday except 1/2 tablet on Tuesdays, Thursdays and Saturdays.  Recheck INR in 1 week.  Call us at [facility] # [phone], Main # [phone].

## 2018-01-01 ENCOUNTER — Other Ambulatory Visit: Payer: Self-pay

## 2018-01-01 ENCOUNTER — Telehealth: Payer: Self-pay | Admitting: *Deleted

## 2018-01-01 ENCOUNTER — Ambulatory Visit (INDEPENDENT_AMBULATORY_CARE_PROVIDER_SITE_OTHER): Payer: Medicare Other | Admitting: Cardiology

## 2018-01-01 ENCOUNTER — Other Ambulatory Visit: Payer: Self-pay | Admitting: Adult Health

## 2018-01-01 DIAGNOSIS — I4891 Unspecified atrial fibrillation: Secondary | ICD-10-CM

## 2018-01-01 DIAGNOSIS — Z5181 Encounter for therapeutic drug level monitoring: Secondary | ICD-10-CM | POA: Diagnosis not present

## 2018-01-01 DIAGNOSIS — Z8673 Personal history of transient ischemic attack (TIA), and cerebral infarction without residual deficits: Secondary | ICD-10-CM

## 2018-01-01 LAB — POCT INR: INR: 1.6 — AB (ref 2.0–3.0)

## 2018-01-01 MED ORDER — SUCRALFATE 1 G PO TABS: 1.0000 g | ORAL_TABLET | Freq: Four times a day (QID) | ORAL | 0 refills | Status: DC | PRN

## 2018-01-01 MED ORDER — FUROSEMIDE 20 MG PO TABS: 20.0000 mg | ORAL_TABLET | Freq: Every day | ORAL | 3 refills | Status: DC

## 2018-01-01 MED ORDER — ENOXAPARIN SODIUM 60 MG/0.6ML ~~LOC~~ SOLN: 55.0000 mg | Freq: Two times a day (BID) | SUBCUTANEOUS | 0 refills | Status: DC

## 2018-01-01 NOTE — Telephone Encounter (Signed)
Per Dr Edison PaceMcKeown,it is OK to evaluate and treat the patient for speech therapy.  [facility] therapist is aware.

## 2018-01-01 NOTE — Telephone Encounter (Signed)
Furosemide refill request. Also, will run out of the sucralfate on Friday. She has been doing so well without any stomach pain. Son is concerned that the weekend will be bad once she stops taking it. Can this be refilled, if not, would you prescribed another prescription for Ranitidine?

## 2018-01-04 ENCOUNTER — Telehealth: Payer: Self-pay | Admitting: Pharmacist

## 2018-01-04 ENCOUNTER — Ambulatory Visit (INDEPENDENT_AMBULATORY_CARE_PROVIDER_SITE_OTHER): Payer: Medicare Other

## 2018-01-04 DIAGNOSIS — Z8673 Personal history of transient ischemic attack (TIA), and cerebral infarction without residual deficits: Secondary | ICD-10-CM

## 2018-01-04 DIAGNOSIS — Z5181 Encounter for therapeutic drug level monitoring: Secondary | ICD-10-CM

## 2018-01-04 DIAGNOSIS — I4891 Unspecified atrial fibrillation: Secondary | ICD-10-CM | POA: Diagnosis not present

## 2018-01-04 LAB — POCT INR: INR: 1.6 — AB (ref 2.0–3.0)

## 2018-01-04 MED ORDER — ENOXAPARIN SODIUM 60 MG/0.6ML ~~LOC~~ SOLN: 55.0000 mg | Freq: Two times a day (BID) | SUBCUTANEOUS | 0 refills | Status: DC

## 2018-01-04 NOTE — Telephone Encounter (Signed)
PA approved! Pharmacy aware

## 2018-01-04 NOTE — Telephone Encounter (Signed)
[facility] Lovenox needs PA determination due to QTY >18 syringes in 67 days. Called insurance at [phone] to do PA. Requested for additional 20 syringes. Per Steward DroneBrenda with [employer] will have determination by this evening and fax will be sent to office. Reiterated need for urgent approval as patient needs injection this evening. She has sent urgently.

## 2018-01-04 NOTE — Patient Instructions (Signed)
Description   Spoke with Alfonso PattenGenevive RN with [facility] while in pt's home instructed to have pt take an extra 1/2 tablet today, then take 3mg  daily until recheck on Monday.  Continue Lovenox 55mg  SQ every 12 hrs.  Recheck INR on Monday.  Call us at [facility] # [phone], Main # [phone].

## 2018-01-07 ENCOUNTER — Ambulatory Visit (INDEPENDENT_AMBULATORY_CARE_PROVIDER_SITE_OTHER): Payer: Medicare Other

## 2018-01-07 DIAGNOSIS — I4891 Unspecified atrial fibrillation: Secondary | ICD-10-CM

## 2018-01-07 DIAGNOSIS — Z8673 Personal history of transient ischemic attack (TIA), and cerebral infarction without residual deficits: Secondary | ICD-10-CM | POA: Diagnosis not present

## 2018-01-07 DIAGNOSIS — Z5181 Encounter for therapeutic drug level monitoring: Secondary | ICD-10-CM | POA: Diagnosis not present

## 2018-01-07 LAB — POCT INR: INR: 2.6 (ref 2.0–3.0)

## 2018-01-07 NOTE — Patient Instructions (Signed)
Description   Spoke with Alfonso PattenGenevive RN with [facility] while in pt's home instructed to have pt resume previous dosage regimen 2mg  daily except 1mg  on Tuesdays and Saturdays.  Stop Lovenox injections.  Recheck INR in 1 week.  Call us at [facility] # [phone], Main # [phone].

## 2018-01-14 LAB — POCT INR: INR: 2.8 (ref 2.0–3.0)

## 2018-01-15 ENCOUNTER — Other Ambulatory Visit: Payer: Self-pay | Admitting: Cardiology

## 2018-01-15 ENCOUNTER — Ambulatory Visit (INDEPENDENT_AMBULATORY_CARE_PROVIDER_SITE_OTHER): Payer: Medicare Other | Admitting: Interventional Cardiology

## 2018-01-15 DIAGNOSIS — I4891 Unspecified atrial fibrillation: Secondary | ICD-10-CM

## 2018-01-15 DIAGNOSIS — Z5181 Encounter for therapeutic drug level monitoring: Secondary | ICD-10-CM | POA: Diagnosis not present

## 2018-01-15 DIAGNOSIS — Z8673 Personal history of transient ischemic attack (TIA), and cerebral infarction without residual deficits: Secondary | ICD-10-CM

## 2018-01-21 ENCOUNTER — Other Ambulatory Visit: Payer: Self-pay | Admitting: *Deleted

## 2018-01-21 MED ORDER — SUCRALFATE 1 G PO TABS: 1.0000 g | ORAL_TABLET | Freq: Four times a day (QID) | ORAL | 0 refills | Status: DC | PRN

## 2018-01-23 ENCOUNTER — Ambulatory Visit (INDEPENDENT_AMBULATORY_CARE_PROVIDER_SITE_OTHER): Payer: Medicare Other | Admitting: Internal Medicine

## 2018-01-23 DIAGNOSIS — I4891 Unspecified atrial fibrillation: Secondary | ICD-10-CM

## 2018-01-23 DIAGNOSIS — Z8673 Personal history of transient ischemic attack (TIA), and cerebral infarction without residual deficits: Secondary | ICD-10-CM

## 2018-01-23 DIAGNOSIS — Z5181 Encounter for therapeutic drug level monitoring: Secondary | ICD-10-CM | POA: Diagnosis not present

## 2018-01-23 LAB — POCT INR: INR: 2.1 (ref 2.0–3.0)

## 2018-02-06 ENCOUNTER — Telehealth: Payer: Self-pay | Admitting: *Deleted

## 2018-02-06 ENCOUNTER — Ambulatory Visit (INDEPENDENT_AMBULATORY_CARE_PROVIDER_SITE_OTHER): Payer: Medicare Other | Admitting: Internal Medicine

## 2018-02-06 DIAGNOSIS — Z5181 Encounter for therapeutic drug level monitoring: Secondary | ICD-10-CM | POA: Diagnosis not present

## 2018-02-06 DIAGNOSIS — I4891 Unspecified atrial fibrillation: Secondary | ICD-10-CM | POA: Diagnosis not present

## 2018-02-06 DIAGNOSIS — Z8673 Personal history of transient ischemic attack (TIA), and cerebral infarction without residual deficits: Secondary | ICD-10-CM

## 2018-02-06 LAB — POCT INR: INR: 2.3 (ref 2.0–3.0)

## 2018-02-06 NOTE — Patient Instructions (Signed)
Description   Genevive RN with [facility]  instructed to have pt continue same dosage regimen 2mg  daily except 1mg  on Tuesdays and Saturdays.  Recheck INR in 3 weeks.  Call us at [facility] # [phone], Main # [phone].

## 2018-02-06 NOTE — Telephone Encounter (Signed)
Patient's BP was 159/88, per Denny Peon.  Per Dr Oneta Rack, the BP is borderline high, so no medication changes and continue to monitor.

## 2018-02-07 ENCOUNTER — Telehealth: Payer: Self-pay | Admitting: *Deleted

## 2018-02-07 NOTE — Telephone Encounter (Signed)
A fax was returned to [facility] stating the [facility]in Clinic adjust the patient's Coumadin medication.

## 2018-02-07 NOTE — Telephone Encounter (Signed)
Patient's son called and reported the patient forgot to take her Metoprolol 25 mg 1/2 tablet after supper and BP was 200/?,during the night, per the nurse at [facility].  Her BP this afternoon is 152/92 and she has a headache.  Per Dr Oneta RackMcKeown, resume her Metoprolol 12.5 mg after supper,as usual, and it is OK to take Tylenol for her headache.

## 2018-02-07 NOTE — Progress Notes (Signed)
MEDICARE ANNUAL WELLNESS VISIT AND FOLLOW UP  Assessment:    Medicare annual wellness visit, subsequent Due next year  Essential hypertension - continue medications BUT MONITOR AT HOME, WANT HIGHER BP DUE TO AGE, DASH diet, exercise and monitor at home. Call if greater than 150/90.  -     CBC with Differential/Platelet -     CMP/GFR -     TSH  Hyperlipidemia -     Lipid panel -- continue medications, check lipids, decrease fatty foods, increase activity.   Type 2 diabetes mellitus with stage 2 chronic kidney disease, without long-term current use of insulin (HCC) -     Hemoglobin A1c -  Discussed general issues about diabetes pathophysiology and management., Educational material distributed., Suggested low cholesterol diet., Encouraged aerobic exercise., Discussed foot care., Reminded to get yearly retinal exam.  Atrial fibrillation, unspecified type (HCC)  - on coumadin continue follow cards - may need to stop at some point due to age, risk/benefits, discuss with cardiology   Diastolic congestive heart failure, unspecified congestive heart failure chronicity (HCC)  - weight stable, continue medications, follow cards  Vitamin D deficiency Continue supplementation Check vitamin D level  Medication management -     Magnesium  APHASIA DUE TO CEREBROVASCULAR DISEASE  -Control blood pressure, cholesterol, glucose, increase exercise.   History of CVA  -Control blood pressure, cholesterol, glucose, increase exercise.   Anxiety state  - controlled  Gastroesophageal reflux disease, esophagitis presence not specified  -Continue PPI/H2 blocker, diet discussed  Type 2 diabetes mellitus with diabetic neuropathy, without long-term current use of insulin (HCC)  - Discussed general issues about diabetes pathophysiology and management., Educational material distributed., Suggested low cholesterol diet., Encouraged aerobic exercise., Discussed foot care., Reminded to get yearly retinal  exam.  Atherosclerosis of Aorta  -  Control blood pressure, cholesterol, glucose, increase exercise.   Unsteady gait/dementia Needing additional help; established with [facility] until recently, will reorder for assistance with medication, bathing, compression hose  Over 30 minutes of exam, counseling, chart review, and critical decision making was performed  Future Appointments  Date Time Provider Department Center  02/27/2018 11:00 AM [facility]  05/27/2018  9:00 AM Lucky Cowboy, MD [facility] None    Plan:   During the course of the visit the patient was educated and counseled about appropriate screening and preventive services including:    Pneumococcal vaccine   Influenza vaccine  Td vaccine  Prevnar 13  Screening electrocardiogram  Screening mammography  Bone densitometry screening  Colorectal cancer screening  Diabetes screening  Glaucoma screening  Nutrition counseling   Advanced directives: given info/requested copies   Subjective:   Lisa Hopkins is a 83 y.o. female who presents for Medicare Annual Wellness Visit and 3 month follow up on hypertension, diabetes, hyperlipidemia, vitamin D def. She has afib, on coumadin, has history of CVA with poor ST recall and dysomnia. Follows with cardiology, is moderate-high fall risk, poor compliance with walker use.  She has history of diastolic heart failure is on lasix and weight is stable.   She is at [employer], [facility] was coming 3 days a week to assist with bathing but stopped, she is having difficulty bathing by herself, cannot get compression hose on/off, struggles with medication and remembering to take despite assistance/reminders by family.  BMI is Body mass index is 21.67 kg/m., she has been working on diet and exercise. Wt Readings from Last 3 Encounters:  02/11/18 116 lb 9.6 oz (52.9 kg)  12/20/17 115 lb (52.2 kg)  12/13/17 121 lb (54.9 kg)   Her  blood pressure has been controlled at home, today their BP is BP: 108/70   She does not workout. She denies chest pain, shortness of breath, dizziness.    She is not on cholesterol medication secondary to age. Her cholesterol is not at goal. The cholesterol last visit was:   Lab Results  Component Value Date   CHOL 213 (H) 08/03/2017   HDL 43 (L) 08/03/2017   LDLCALC 136 (H) 08/03/2017   TRIG 72 12/03/2017   CHOLHDL 5.0 (H) 08/03/2017   She has been working on diet and exercise for diet controlled Diabetes with diabetic chronic kidney disease, she is not on bASA (on coumadin), she is on ACE/ARB, and denies polydipsia and polyuria. Last A1C was:  Lab Results  Component Value Date   HGBA1C 6.2 (H) 11/08/2017   Lab Results  Component Value Date   GFRNONAA 68 12/20/2017    Patient is on Vitamin D supplement.   Lab Results  Component Value Date   VD25OH 53 11/08/2017      Medication Review Current Outpatient Medications on File Prior to Visit  Medication Sig  . Cholecalciferol (VITAMIN D) 2000 UNITS CAPS Take 2,000 Units by mouth daily.   Marland Kitchen CINNAMON PO Take 1 capsule by mouth daily.  Marland Kitchen dicyclomine (BENTYL) 20 MG tablet Take 1 tablet 3 x day if needed for nausea, bloating , cramping or diarrhea  . enoxaparin (LOVENOX) 60 MG/0.6ML injection Inject 0.55 mLs (55 mg total) into the skin every 12 (twelve) hours.  . furosemide (LASIX) 20 MG tablet Take 1 tablet (20 mg total) by mouth daily.  Marland Kitchen losartan (COZAAR) 50 MG tablet Take 1/2-1 tab daily.  . Magnesium Hydroxide (MAGNESIA PO) Take 1 tablet by mouth daily.   . metoprolol tartrate (LOPRESSOR) 25 MG tablet Take 0.5 tablets (12.5 mg total) by mouth 2 (two) times daily.  . ondansetron (ZOFRAN ODT) 4 MG disintegrating tablet Take 1 tablet (4 mg total) by mouth every 8 (eight) hours as needed for nausea or vomiting.  . pantoprazole (PROTONIX) 40 MG tablet Take 1 tablet (40 mg total) by mouth 2 (two) times daily.  . potassium chloride  (K-DUR) 10 MEQ tablet Take 1 tablet (10 mEq total) by mouth daily.  . QUEtiapine (SEROQUEL) 25 MG tablet Take 1 tablet at bedtime (Patient taking differently: Take 12.5 mg by mouth at bedtime. )  . sucralfate (CARAFATE) 1 g tablet Take 1 tablet (1 g total) by mouth 4 (four) times daily as needed for up to 15 days.  Marland Kitchen warfarin (COUMADIN) 2 MG tablet TAKE AS DIRECTED BY ANTICOAGULATION CLINIC.   No current facility-administered medications on file prior to visit.     Allergies: Allergies  Allergen Reactions  . Ace Inhibitors Other (See Comments)    Cough  . Fosamax [Alendronate Sodium] Other (See Comments)    Heart burn  . Norvasc [Amlodipine Besylate] Other (See Comments)    edema  . Zocor [Simvastatin] Other (See Comments)    Elevated CPK    Current Problems (verified) has Anxiety state; Essential hypertension; Atrial fibrillation (HCC); History of CVA (cerebrovascular accident); APHASIA DUE TO CEREBROVASCULAR DISEASE; Hyperlipidemia, mixed; Vitamin D deficiency; Diastolic CHF (HCC); GERD; Atherosclerosis of aorta (HCC); CKD stage 2 due to type 2 diabetes mellitus (HCC); Hiatal hernia; and Encounter for therapeutic drug monitoring on their problem list.  Screening Tests Immunization History  Administered Date(s) Administered  . DT 02/18/2015  .  Influenza, High Dose Seasonal PF 03/05/2014, 02/18/2015, 03/16/2016, 03/05/2017  . Influenza-Unspecified 01/30/2013  . PPD Test 10/26/2017  . Pneumococcal-Unspecified 04/27/2004  . Td 06/28/2003  . Zoster 04/27/2009    Preventative care: Last colonoscopy: 2007, will not get another due to age Last mammogram: 03/2015, DONE Last pap smear/pelvic exam: remote   DEXA:Jan 17 2008, declines Echo 2011  Prior vaccinations: TD or Tdap: 2016  Influenza: 2017 Pneumococcal: 2005 Prevnar13: DUE, declines today Shingles/Zostavax: 2010  Names of Other Physician/Practitioners you currently use: 1. [facility]- here for primary care 2. Elmer PickerHecker, eye doctor, last visit 06/16/2017 - report verified and abstracted 3. Dr. Francesca JewettIrwin, dentist, last visit 2019  Patient Care Team: Lucky CowboyMcKeown, William, MD as PCP - General (Internal Medicine) Lars MassonNelson, Katarina H, MD as PCP - Cardiology (Cardiology) Mateo FlowHecker, Kathryn, MD as Consulting Physician (Ophthalmology) Lars MassonNelson, Katarina H, MD as Consulting Physician (Cardiology) Bernette RedbirdBuccini, Robert, MD as Consulting Physician (Gastroenterology) Cherlyn RobertsLupton, Frederick, MD as Consulting Physician (Dermatology) Richardean ChimeraMcComb, John, MD as Consulting Physician (Obstetrics and Gynecology)  Surgical: She  has a past surgical history that includes Upper gastrointestinal endoscopy (2005); Colonoscopy (2005); Cholecystectomy; Cataract extraction, bilateral; Tonsillectomy; and Esophagogastroduodenoscopy (egd) with propofol (N/A, 11/28/2017). Family Her family history includes Arthritis in her sister and sister; Diabetes in her mother; Heart disease in her brother; Hypertension in her brother; Stroke in her mother. Social history  She reports that she has never smoked. She has never used smokeless tobacco. She reports that she does not drink alcohol or use drugs.  MEDICARE WELLNESS OBJECTIVES: Physical activity: Current Exercise Habits: Home exercise routine, Type of exercise: walking, Time (Minutes): 30, Frequency (Times/Week): 7, Weekly Exercise (Minutes/Week): 210, Intensity: Mild, Exercise limited by: orthopedic condition(s) Cardiac risk factors: Cardiac Risk Factors include: advanced age (>4555men, 40>65 women);diabetes mellitus;dyslipidemia;hypertension Depression/mood screen:   Depression screen [facility] 02/11/2018  Decreased Interest 0  Down, Depressed, Hopeless 0  PHQ - 2 Score 0    ADLs:  In your present state of health, do you have any difficulty performing the following activities: 02/11/2018 12/03/2017  Hearing? N N  Vision? N N  Difficulty concentrating or making decisions? Y N  Walking  or climbing stairs? Y Y  Comment Uses walker -  Dressing or bathing? Y N  Comment was having [facility] help with bathing 3 x/week, has lapsed and struggling -  Doing errands, shopping? Y Y  Comment driven by son -  Quarry managerreparing Food and eating ? N -  Comment has dinner meals at [place], can do simple meals by herself -  Using the Toilet? N -  In the past six months, have you accidently leaked urine? N -  Do you have problems with loss of bowel control? N -  Managing your Medications? Y -  Managing your Finances? Y -  Comment son manages -  Housekeeping or managing your Housekeeping? Y -  [place] -  Some recent data might be hidden    Cognitive Testing  Alert? Yes  Normal Appearance?Yes  Oriented to person? Yes  Place? Yes   Time? Yes  Recall of three objects?  1/3  Can perform simple calculations? no  Displays appropriate judgment? No, looks to son to make choices  Can read the correct time from a watch face?Yes  EOL planning: Does Patient Have a Medical Advance Directive?: Yes Type of Advance Directive: Healthcare Power of Attorney, Living will Does patient want to make changes to medical advance directive?: No - Patient declined  Copy of Healthcare Power of Attorney in Chart?: Yes   Objective:   Today's Vitals   02/11/18 1423  BP: 108/70  Pulse: 79  Temp: 97.7 F (36.5 C)  SpO2: 97%  Weight: 116 lb 9.6 oz (52.9 kg)  Height: 5' 1.5" (1.562 m)   Body mass index is 21.67 kg/m.  General appearance: alert, no distress, WD/WN,  female HEENT: normocephalic, sclerae anicteric, TMs pearly, nares patent, no discharge or erythema, pharynx normal Oral cavity: MMM, no lesions Neck: supple, no lymphadenopathy, no thyromegaly, no masses Heart: Irreg irreg, normal S1, S2, no murmurs Lungs: CTA bilaterally, no wheezes, rhonchi, or rales Abdomen: +bs, soft, non tender, non distended, no masses, no hepatomegaly, no splenomegaly Musculoskeletal: nontender, no  swelling, no obvious deformity Extremities: wears bilateral compression, no warmth/redness/swelling, no cyanosis, no clubbing Pulses: 2+ symmetric, upper and lower extremities, normal cap refill Neurological: alert, oriented x 3, CN2-12 intact, strength normal upper extremities and lower extremities, sensation decreased bilateral feet to mid shin DTRs 2+ throughout, no cerebellar signs, gait slow with walker, poor judgement/recall Psychiatric: normal affect, behavior normal, anomia, pleasant  Breast: defer Gyn: defer Rectal: defer   Medicare Attestation I have personally reviewed: The patient's medical and social history Their use of alcohol, tobacco or illicit drugs Their current medications and supplements The patient's functional ability including ADLs,fall risks, home safety risks, cognitive, and hearing and visual impairment Diet and physical activities Evidence for depression or mood disorders  The patient's weight, height, BMI, and visual acuity have been recorded in the chart.  I have made referrals, counseling, and provided education to the patient based on review of the above and I have provided the patient with a written personalized care plan for preventive services.     Dan Maker, NP   02/11/2018

## 2018-02-11 ENCOUNTER — Ambulatory Visit: Payer: Medicare Other | Admitting: Adult Health

## 2018-02-11 ENCOUNTER — Encounter: Payer: Self-pay | Admitting: Adult Health

## 2018-02-11 ENCOUNTER — Telehealth: Payer: Self-pay | Admitting: Internal Medicine

## 2018-02-11 VITALS — BP 108/70 | HR 79 | Temp 97.7°F | Ht 61.5 in | Wt 116.6 lb

## 2018-02-11 DIAGNOSIS — I5030 Unspecified diastolic (congestive) heart failure: Secondary | ICD-10-CM | POA: Diagnosis not present

## 2018-02-11 DIAGNOSIS — E559 Vitamin D deficiency, unspecified: Secondary | ICD-10-CM

## 2018-02-11 DIAGNOSIS — I4891 Unspecified atrial fibrillation: Secondary | ICD-10-CM | POA: Diagnosis not present

## 2018-02-11 DIAGNOSIS — E1122 Type 2 diabetes mellitus with diabetic chronic kidney disease: Secondary | ICD-10-CM | POA: Diagnosis not present

## 2018-02-11 DIAGNOSIS — Z5181 Encounter for therapeutic drug level monitoring: Secondary | ICD-10-CM

## 2018-02-11 DIAGNOSIS — I700 Atherosclerosis of aorta: Secondary | ICD-10-CM | POA: Diagnosis not present

## 2018-02-11 DIAGNOSIS — Z8673 Personal history of transient ischemic attack (TIA), and cerebral infarction without residual deficits: Secondary | ICD-10-CM

## 2018-02-11 DIAGNOSIS — I69920 Aphasia following unspecified cerebrovascular disease: Secondary | ICD-10-CM

## 2018-02-11 DIAGNOSIS — K449 Diaphragmatic hernia without obstruction or gangrene: Secondary | ICD-10-CM | POA: Diagnosis not present

## 2018-02-11 DIAGNOSIS — Z0001 Encounter for general adult medical examination with abnormal findings: Secondary | ICD-10-CM | POA: Diagnosis not present

## 2018-02-11 DIAGNOSIS — E782 Mixed hyperlipidemia: Secondary | ICD-10-CM

## 2018-02-11 DIAGNOSIS — N182 Chronic kidney disease, stage 2 (mild): Secondary | ICD-10-CM | POA: Diagnosis not present

## 2018-02-11 DIAGNOSIS — K219 Gastro-esophageal reflux disease without esophagitis: Secondary | ICD-10-CM

## 2018-02-11 DIAGNOSIS — F411 Generalized anxiety disorder: Secondary | ICD-10-CM

## 2018-02-11 DIAGNOSIS — R6889 Other general symptoms and signs: Secondary | ICD-10-CM

## 2018-02-11 DIAGNOSIS — F0390 Unspecified dementia without behavioral disturbance: Secondary | ICD-10-CM

## 2018-02-11 DIAGNOSIS — I10 Essential (primary) hypertension: Secondary | ICD-10-CM

## 2018-02-11 DIAGNOSIS — Z0000 Encounter for general adult medical examination without abnormal findings: Secondary | ICD-10-CM

## 2018-02-11 DIAGNOSIS — R2689 Other abnormalities of gait and mobility: Secondary | ICD-10-CM

## 2018-02-11 DIAGNOSIS — Z79899 Other long term (current) drug therapy: Secondary | ICD-10-CM

## 2018-02-11 NOTE — Telephone Encounter (Signed)
[facility], per [employer] Inc[employer]shely Corbett; [facility]. Intake accepted and recert process to beginning.

## 2018-02-11 NOTE — Patient Instructions (Addendum)
Lisa Hopkins , Thank you for taking time to come for your Medicare Wellness Visit. I appreciate your ongoing commitment to your health goals. Please review the following plan we discussed and let me know if I can assist you in the future.   These are the goals we discussed: Goals    . Blood Pressure < 140/90       This is a list of the screening recommended for you and due dates:  Health Maintenance  Topic Date Due  . Flu Shot  02/18/2018*  . Complete foot exam   04/18/2018  . Hemoglobin A1C  05/10/2018  . Eye exam for diabetics  06/26/2018  . Tetanus Vaccine  02/17/2025  . DEXA scan (bone density measurement)  Completed  . Pneumonia vaccines  Discontinued  *Topic was postponed. The date shown is not the original due date.     Fall Prevention in the Home Falls can cause injuries and can affect people from all age groups. There are many simple things that you can do to make your home safe and to help prevent falls. What can I do on the outside of my home?  Regularly repair the edges of walkways and driveways and fix any cracks.  Remove high doorway thresholds.  Trim any shrubbery on the main path into your home.  Use bright outdoor lighting.  Clear walkways of debris and clutter, including tools and rocks.  Regularly check that handrails are securely fastened and in good repair. Both sides of any steps should have handrails.  Install guardrails along the edges of any raised decks or porches.  Have leaves, snow, and ice cleared regularly.  Use sand or salt on walkways during winter months.  In the garage, clean up any spills right away, including grease or oil spills. What can I do in the bathroom?  Use night lights.  Install grab bars by the toilet and in the tub and shower. Do not use towel bars as grab bars.  Use non-skid mats or decals on the floor of the tub or shower.  If you need to sit down while you are in the shower, use a plastic, non-slip  stool.  Keep the floor dry. Immediately clean up any water that spills on the floor.  Remove soap buildup in the tub or shower on a regular basis.  Attach bath mats securely with double-sided non-slip rug tape.  Remove throw rugs and other tripping hazards from the floor. What can I do in the bedroom?  Use night lights.  Make sure that a bedside light is easy to reach.  Do not use oversized bedding that drapes onto the floor.  Have a firm chair that has side arms to use for getting dressed.  Remove throw rugs and other tripping hazards from the floor. What can I do in the kitchen?  Clean up any spills right away.  Avoid walking on wet floors.  Place frequently used items in easy-to-reach places.  If you need to reach for something above you, use a sturdy step stool that has a grab bar.  Keep electrical cables out of the way.  Do not use floor polish or wax that makes floors slippery. If you have to use wax, make sure that it is non-skid floor wax.  Remove throw rugs and other tripping hazards from the floor. What can I do in the stairways?  Do not leave any items on the stairs.  Make sure that there are handrails on both sides of  the stairs. Fix handrails that are broken or loose. Make sure that handrails are as long as the stairways.  Check any carpeting to make sure that it is firmly attached to the stairs. Fix any carpet that is loose or worn.  Avoid having throw rugs at the top or bottom of stairways, or secure the rugs with carpet tape to prevent them from moving.  Make sure that you have a light switch at the top of the stairs and the bottom of the stairs. If you do not have them, have them installed. What are some other fall prevention tips?  Wear closed-toe shoes that fit well and support your feet. Wear shoes that have rubber soles or low heels.  When you use a stepladder, make sure that it is completely opened and that the sides are firmly locked. Have  someone hold the ladder while you are using it. Do not climb a closed stepladder.  Add color or contrast paint or tape to grab bars and handrails in your home. Place contrasting color strips on the first and last steps.  Use mobility aids as needed, such as canes, walkers, scooters, and crutches.  Turn on lights if it is dark. Replace any light bulbs that burn out.  Set up furniture so that there are clear paths. Keep the furniture in the same spot.  Fix any uneven floor surfaces.  Choose a carpet design that does not hide the edge of steps of a stairway.  Be aware of any and all pets.  Review your medicines with your healthcare provider. Some medicines can cause dizziness or changes in blood pressure, which increase your risk of falling. Talk with your health care provider about other ways that you can decrease your risk of falls. This may include working with a physical therapist or trainer to improve your strength, balance, and endurance. This information is not intended to replace advice given to you by your health care provider. Make sure you discuss any questions you have with your health care provider. Document Released: 05/05/2002 Document Revised: 10/12/2015 Document Reviewed: 06/19/2014 Elsevier Interactive Patient Education  [employer].

## 2018-02-12 LAB — LIPID PANEL
Cholesterol: 187 mg/dL (ref <200)
HDL: 50 mg/dL — ABNORMAL LOW (ref >50)
LDL Cholesterol (Calc): 110 mg/dL (calc) — ABNORMAL HIGH
Non-HDL Cholesterol (Calc): 137 mg/dL (calc) — ABNORMAL HIGH (ref <130)
Total CHOL/HDL Ratio: 3.7 (calc) (ref <5.0)
Triglycerides: 156 mg/dL — ABNORMAL HIGH (ref <150)

## 2018-02-12 LAB — COMPLETE METABOLIC PANEL WITH GFR
AG Ratio: 1.5 (calc) (ref 1.0–2.5)
ALT: 12 U/L (ref 6–29)
AST: 17 U/L (ref 10–35)
Albumin: 4 g/dL (ref 3.6–5.1)
Alkaline phosphatase (APISO): 132 U/L — ABNORMAL HIGH (ref 33–130)
BUN: 15 mg/dL (ref 7–25)
CALCIUM: 10 mg/dL (ref 8.6–10.4)
CO2: 32 mmol/L (ref 20–32)
CREATININE: 0.84 mg/dL (ref 0.60–0.88)
Chloride: 103 mmol/L (ref 98–110)
GFR, EST NON AFRICAN AMERICAN: 61 mL/min/{1.73_m2} (ref >=60)
GFR, Est African American: 70 mL/min/{1.73_m2} (ref >=60)
GLUCOSE: 140 mg/dL — AB (ref 65–99)
Globulin: 2.6 g/dL (calc) (ref 1.9–3.7)
Potassium: 4.7 mmol/L (ref 3.5–5.3)
Sodium: 142 mmol/L (ref 135–146)
Total Bilirubin: 0.6 mg/dL (ref 0.2–1.2)
Total Protein: 6.6 g/dL (ref 6.1–8.1)

## 2018-02-12 LAB — CBC WITH DIFFERENTIAL/PLATELET
BASOS PCT: 0.8 %
Basophils Absolute: 59 cells/uL (ref 0–200)
EOS PCT: 2.4 %
Eosinophils Absolute: 178 cells/uL (ref 15–500)
HEMATOCRIT: 39.4 % (ref 35.0–45.0)
Hemoglobin: 13.4 g/dL (ref 11.7–15.5)
LYMPHS ABS: 2975 {cells}/uL (ref 850–3900)
MCH: 30 pg (ref 27.0–33.0)
MCHC: 34 g/dL (ref 32.0–36.0)
MCV: 88.3 fL (ref 80.0–100.0)
MPV: 10.3 fL (ref 7.5–12.5)
Monocytes Relative: 7.9 %
NEUTROS ABS: 3604 {cells}/uL (ref 1500–7800)
NEUTROS PCT: 48.7 %
Platelets: 259 10*3/uL (ref 140–400)
RBC: 4.46 10*6/uL (ref 3.80–5.10)
RDW: 12.1 % (ref 11.0–15.0)
Total Lymphocyte: 40.2 %
WBC: 7.4 10*3/uL (ref 3.8–10.8)
WBCMIX: 585 {cells}/uL (ref 200–950)

## 2018-02-12 LAB — VITAMIN D 25 HYDROXY (VIT D DEFICIENCY, FRACTURES): Vit D, 25-Hydroxy: 46 ng/mL (ref 30–100)

## 2018-02-12 LAB — MAGNESIUM: Magnesium: 1.8 mg/dL (ref 1.5–2.5)

## 2018-02-12 LAB — HEMOGLOBIN A1C
Hgb A1c MFr Bld: 6.2 % of total Hgb — ABNORMAL HIGH (ref <5.7)
MEAN PLASMA GLUCOSE: 131 (calc)
eAG (mmol/L): 7.3 (calc)

## 2018-02-12 LAB — TSH: TSH: 0.66 mIU/L (ref 0.40–4.50)

## 2018-02-13 ENCOUNTER — Telehealth: Payer: Self-pay | Admitting: Internal Medicine

## 2018-02-13 NOTE — Telephone Encounter (Signed)
[facility] Patient notified.

## 2018-02-15 DIAGNOSIS — R633 Feeding difficulties: Secondary | ICD-10-CM | POA: Diagnosis not present

## 2018-02-15 DIAGNOSIS — K449 Diaphragmatic hernia without obstruction or gangrene: Secondary | ICD-10-CM | POA: Diagnosis not present

## 2018-02-15 DIAGNOSIS — R112 Nausea with vomiting, unspecified: Secondary | ICD-10-CM | POA: Diagnosis not present

## 2018-02-17 DIAGNOSIS — I130 Hypertensive heart and chronic kidney disease with heart failure and stage 1 through stage 4 chronic kidney disease, or unspecified chronic kidney disease: Secondary | ICD-10-CM | POA: Diagnosis not present

## 2018-02-17 DIAGNOSIS — I700 Atherosclerosis of aorta: Secondary | ICD-10-CM | POA: Diagnosis not present

## 2018-02-17 DIAGNOSIS — I4891 Unspecified atrial fibrillation: Secondary | ICD-10-CM | POA: Diagnosis not present

## 2018-02-17 DIAGNOSIS — E1122 Type 2 diabetes mellitus with diabetic chronic kidney disease: Secondary | ICD-10-CM | POA: Diagnosis not present

## 2018-02-17 DIAGNOSIS — Z7901 Long term (current) use of anticoagulants: Secondary | ICD-10-CM | POA: Diagnosis not present

## 2018-02-17 DIAGNOSIS — Z5181 Encounter for therapeutic drug level monitoring: Secondary | ICD-10-CM | POA: Diagnosis not present

## 2018-02-17 DIAGNOSIS — N182 Chronic kidney disease, stage 2 (mild): Secondary | ICD-10-CM | POA: Diagnosis not present

## 2018-02-17 DIAGNOSIS — I69320 Aphasia following cerebral infarction: Secondary | ICD-10-CM | POA: Diagnosis not present

## 2018-02-17 DIAGNOSIS — Z9181 History of falling: Secondary | ICD-10-CM | POA: Diagnosis not present

## 2018-02-17 DIAGNOSIS — E1140 Type 2 diabetes mellitus with diabetic neuropathy, unspecified: Secondary | ICD-10-CM | POA: Diagnosis not present

## 2018-02-17 DIAGNOSIS — F411 Generalized anxiety disorder: Secondary | ICD-10-CM | POA: Diagnosis not present

## 2018-02-17 DIAGNOSIS — E559 Vitamin D deficiency, unspecified: Secondary | ICD-10-CM | POA: Diagnosis not present

## 2018-02-17 DIAGNOSIS — K449 Diaphragmatic hernia without obstruction or gangrene: Secondary | ICD-10-CM | POA: Diagnosis not present

## 2018-02-17 DIAGNOSIS — I5030 Unspecified diastolic (congestive) heart failure: Secondary | ICD-10-CM | POA: Diagnosis not present

## 2018-02-17 DIAGNOSIS — F0390 Unspecified dementia without behavioral disturbance: Secondary | ICD-10-CM | POA: Diagnosis not present

## 2018-02-18 ENCOUNTER — Telehealth: Payer: Self-pay

## 2018-02-18 NOTE — Telephone Encounter (Signed)
[facility] requesting written order for PT for strengthening and balance. [facility] [phone]

## 2018-02-27 ENCOUNTER — Other Ambulatory Visit: Payer: Self-pay | Admitting: Internal Medicine

## 2018-02-27 ENCOUNTER — Ambulatory Visit (INDEPENDENT_AMBULATORY_CARE_PROVIDER_SITE_OTHER): Payer: Medicare Other | Admitting: Pharmacist

## 2018-02-27 DIAGNOSIS — Z5181 Encounter for therapeutic drug level monitoring: Secondary | ICD-10-CM

## 2018-02-27 DIAGNOSIS — Z79899 Other long term (current) drug therapy: Secondary | ICD-10-CM | POA: Diagnosis not present

## 2018-02-27 DIAGNOSIS — I4891 Unspecified atrial fibrillation: Secondary | ICD-10-CM | POA: Diagnosis not present

## 2018-02-27 DIAGNOSIS — Z8673 Personal history of transient ischemic attack (TIA), and cerebral infarction without residual deficits: Secondary | ICD-10-CM

## 2018-02-27 DIAGNOSIS — K219 Gastro-esophageal reflux disease without esophagitis: Secondary | ICD-10-CM

## 2018-02-27 LAB — POCT INR: INR: 2.2 (ref 2.0–3.0)

## 2018-02-27 MED ORDER — PANTOPRAZOLE SODIUM 40 MG PO TBEC: DELAYED_RELEASE_TABLET | ORAL | 3 refills | Status: DC

## 2018-02-27 NOTE — Patient Instructions (Signed)
Description   Continue same dosage regimen 2mg  daily except 1mg  on Tuesdays and Saturdays.  Recheck INR in 4 weeks.  Call us at [facility] # [phone], Main # [phone].

## 2018-03-15 ENCOUNTER — Other Ambulatory Visit: Payer: Self-pay

## 2018-03-15 ENCOUNTER — Encounter (HOSPITAL_COMMUNITY): Payer: Self-pay | Admitting: Emergency Medicine

## 2018-03-15 ENCOUNTER — Emergency Department (HOSPITAL_COMMUNITY)
Admission: EM | Admit: 2018-03-15 | Discharge: 2018-03-16 | Disposition: A | Discharge status: Home / Self Care | Payer: Medicare Other | Attending: Emergency Medicine | Admitting: Emergency Medicine

## 2018-03-15 DIAGNOSIS — Z8719 Personal history of other diseases of the digestive system: Secondary | ICD-10-CM | POA: Diagnosis not present

## 2018-03-15 DIAGNOSIS — I130 Hypertensive heart and chronic kidney disease with heart failure and stage 1 through stage 4 chronic kidney disease, or unspecified chronic kidney disease: Secondary | ICD-10-CM | POA: Diagnosis not present

## 2018-03-15 DIAGNOSIS — R112 Nausea with vomiting, unspecified: Secondary | ICD-10-CM | POA: Diagnosis not present

## 2018-03-15 DIAGNOSIS — K449 Diaphragmatic hernia without obstruction or gangrene: Secondary | ICD-10-CM | POA: Diagnosis not present

## 2018-03-15 DIAGNOSIS — R109 Unspecified abdominal pain: Secondary | ICD-10-CM | POA: Diagnosis present

## 2018-03-15 DIAGNOSIS — Z7901 Long term (current) use of anticoagulants: Secondary | ICD-10-CM | POA: Diagnosis not present

## 2018-03-15 DIAGNOSIS — R1010 Upper abdominal pain, unspecified: Secondary | ICD-10-CM | POA: Diagnosis not present

## 2018-03-15 DIAGNOSIS — R52 Pain, unspecified: Secondary | ICD-10-CM | POA: Diagnosis not present

## 2018-03-15 DIAGNOSIS — E1122 Type 2 diabetes mellitus with diabetic chronic kidney disease: Secondary | ICD-10-CM | POA: Insufficient documentation

## 2018-03-15 DIAGNOSIS — R1084 Generalized abdominal pain: Secondary | ICD-10-CM | POA: Diagnosis not present

## 2018-03-15 DIAGNOSIS — I5032 Chronic diastolic (congestive) heart failure: Secondary | ICD-10-CM | POA: Insufficient documentation

## 2018-03-15 DIAGNOSIS — I10 Essential (primary) hypertension: Secondary | ICD-10-CM | POA: Diagnosis not present

## 2018-03-15 DIAGNOSIS — Z79899 Other long term (current) drug therapy: Secondary | ICD-10-CM | POA: Diagnosis not present

## 2018-03-15 DIAGNOSIS — D259 Leiomyoma of uterus, unspecified: Secondary | ICD-10-CM | POA: Diagnosis not present

## 2018-03-15 DIAGNOSIS — N182 Chronic kidney disease, stage 2 (mild): Secondary | ICD-10-CM | POA: Diagnosis not present

## 2018-03-15 NOTE — ED Triage Notes (Signed)
Per EMS pt from independent living at [facility]. Pt has dx of hiatal hernia in July but surgery was deferred d/t age. Pt is noted to not be taking her PPI but once a day when prescribed bid and also is out of her bentyl.  Today began having pain and decreased PO intake.  Vomiting and nausea with abdominal pain.

## 2018-03-16 ENCOUNTER — Emergency Department (HOSPITAL_COMMUNITY): Payer: Medicare Other

## 2018-03-16 DIAGNOSIS — D259 Leiomyoma of uterus, unspecified: Secondary | ICD-10-CM | POA: Diagnosis not present

## 2018-03-16 LAB — CBC
HCT: 47.2 % — ABNORMAL HIGH (ref 36.0–46.0)
HEMOGLOBIN: 15.2 g/dL — AB (ref 12.0–15.0)
MCH: 29.3 pg (ref 26.0–34.0)
MCHC: 32.2 g/dL (ref 30.0–36.0)
MCV: 90.9 fL (ref 80.0–100.0)
NRBC: 0 % (ref 0.0–0.2)
Platelets: 250 10*3/uL (ref 150–400)
RBC: 5.19 MIL/uL — ABNORMAL HIGH (ref 3.87–5.11)
RDW: 13.2 % (ref 11.5–15.5)
WBC: 8 10*3/uL (ref 4.0–10.5)

## 2018-03-16 LAB — COMPREHENSIVE METABOLIC PANEL
ALT: 16 U/L (ref 0–44)
AST: 24 U/L (ref 15–41)
Albumin: 4 g/dL (ref 3.5–5.0)
Alkaline Phosphatase: 145 U/L — ABNORMAL HIGH (ref 38–126)
Anion gap: 13 (ref 5–15)
BUN: 16 mg/dL (ref 8–23)
CHLORIDE: 98 mmol/L (ref 98–111)
CO2: 29 mmol/L (ref 22–32)
Calcium: 10.1 mg/dL (ref 8.9–10.3)
Creatinine, Ser: 0.92 mg/dL (ref 0.44–1.00)
GFR calc non Af Amer: 53 mL/min — ABNORMAL LOW (ref >60)
Glucose, Bld: 160 mg/dL — ABNORMAL HIGH (ref 70–99)
Potassium: 3.7 mmol/L (ref 3.5–5.1)
SODIUM: 140 mmol/L (ref 135–145)
TOTAL PROTEIN: 7.4 g/dL (ref 6.5–8.1)
Total Bilirubin: 0.7 mg/dL (ref 0.3–1.2)

## 2018-03-16 LAB — LIPASE, BLOOD: LIPASE: 44 U/L (ref 11–51)

## 2018-03-16 MED ORDER — IOHEXOL 300 MG/ML  SOLN
100.0000 mL | Freq: Once | INTRAMUSCULAR | Status: AC | PRN
  Administered 2018-03-16: 100 mL via INTRAVENOUS

## 2018-03-16 MED ORDER — ONDANSETRON HCL 4 MG/2ML IJ SOLN
4.0000 mg | Freq: Once | INTRAMUSCULAR | Status: AC
  Administered 2018-03-16: 4 mg via INTRAVENOUS
  Filled 2018-03-16: qty 2

## 2018-03-16 MED ORDER — SODIUM CHLORIDE 0.9 % IV BOLUS
500.0000 mL | Freq: Once | INTRAVENOUS | Status: AC
  Administered 2018-03-16: 500 mL via INTRAVENOUS

## 2018-03-16 NOTE — Discharge Instructions (Signed)
Continue medications as previously prescribed.  Return to the emergency department if symptoms significantly worsen or change. 

## 2018-03-16 NOTE — ED Provider Notes (Signed)
[facility] EMERGENCY DEPARTMENT Provider Note   CSN: [id number] Arrival date & time: 03/15/18  2334     History   Chief Complaint Chief Complaint  Patient presents with  . Abdominal Pain    HPI Lisa Hopkins is a 84 y.o. female.  Patient is a 84 year old female with past medical history of hiatal hernia, atrial fibrillation, prior CVA, and diabetes.  She presents today for evaluation of vomiting.  She has been hospitalized in the past for similar complaints related to her hiatal hernia.  The decision for surgery was deferred due to advanced age and she has been treated with medications.  This evening, after eating a few bites of biscuits with gravy, she began having upper abdominal discomfort and vomiting.  She has been unable to stop vomiting since.  She denies any fevers or chills.  She denies any diarrhea or constipation.  The history is provided by the patient.  Abdominal Pain   This is a recurrent problem. The current episode started 1 to 2 hours ago. The problem occurs constantly. The problem has not changed since onset.The pain is associated with eating. The pain is located in the epigastric region. The quality of the pain is cramping. The pain is moderate. Associated symptoms include nausea and vomiting. Pertinent negatives include fever and melena. Nothing aggravates the symptoms. Nothing relieves the symptoms.    Past Medical History:  Diagnosis Date  . Anxiety   . Aphasia as late effect of cerebrovascular accident   . Atrial fibrillation (HCC)   . CKD (chronic kidney disease), stage II   . CVA (cerebral infarction)   . Diabetes mellitus type II   . GERD (gastroesophageal reflux disease)   . Hiatal hernia   . HTN (hypertension)   . Hyperlipidemia   . IBS (irritable bowel syndrome)   . Stroke (HCC)   . Vitamin D deficiency     Patient Active Problem List   Diagnosis Date Noted  . Encounter for therapeutic drug monitoring 12/21/2017  .  Hiatal hernia 12/19/2017  . CKD stage 2 due to type 2 diabetes mellitus (HCC) 08/02/2017  . Atherosclerosis of aorta (HCC) 12/03/2015  . GERD 09/25/2014  . Diastolic CHF (HCC) 08/04/2014  . Hyperlipidemia, mixed   . Vitamin D deficiency   . Anxiety state 03/15/2010  . Essential hypertension 03/15/2010  . Atrial fibrillation (HCC) 03/15/2010  . APHASIA DUE TO CEREBROVASCULAR DISEASE 03/15/2010  . History of CVA (cerebrovascular accident) 03/07/2010    Past Surgical History:  Procedure Laterality Date  . CATARACT EXTRACTION, BILATERAL    . CHOLECYSTECTOMY    . COLONOSCOPY  2005  . ESOPHAGOGASTRODUODENOSCOPY (EGD) WITH PROPOFOL N/A 11/28/2017   Procedure: ESOPHAGOGASTRODUODENOSCOPY (EGD) WITH PROPOFOL;  Surgeon: Graylin Shiver, MD;  Location: [facility] ENDOSCOPY;  Service: Endoscopy;  Laterality: N/A;  . TONSILLECTOMY    . UPPER GASTROINTESTINAL ENDOSCOPY  2005     OB History   None      Home Medications    Prior to Admission medications   Medication Sig Start Date End Date Taking? Authorizing Provider  Cholecalciferol (VITAMIN D) 2000 UNITS CAPS Take 2,000 Units by mouth daily.     [provider]  CINNAMON PO Take 1 capsule by mouth daily.    [provider]  dicyclomine (BENTYL) 20 MG tablet Take 1 tablet 3 x day if needed for nausea, bloating , cramping or diarrhea 06/24/15   Lucky Cowboy, MD  furosemide (LASIX) 20 MG tablet Take 1 tablet (  20 mg total) by mouth daily. 01/01/18   Judd Gaudier, NP  losartan (COZAAR) 50 MG tablet Take 1/2-1 tab daily. 12/20/17   Judd Gaudier, NP  Magnesium Hydroxide (MAGNESIA PO) Take 1 tablet by mouth daily.     [provider]  metoprolol tartrate (LOPRESSOR) 25 MG tablet Take 0.5 tablets (12.5 mg total) by mouth 2 (two) times daily. 07/16/17 02/11/18  Lucky Cowboy, MD  ondansetron (ZOFRAN ODT) 4 MG disintegrating tablet Take 1 tablet (4 mg total) by mouth every 8 (eight) hours as needed for nausea or vomiting.  11/29/17   Rai, Delene Ruffini, MD  pantoprazole (PROTONIX) 40 MG tablet Take 1 tablet daily for Heartburn & Acid Indigestion 02/27/18   Lucky Cowboy, MD  potassium chloride (K-DUR) 10 MEQ tablet Take 1 tablet (10 mEq total) by mouth daily. 12/20/17   Judd Gaudier, NP  QUEtiapine (SEROQUEL) 25 MG tablet Take 1 tablet at bedtime Patient taking differently: Take 12.5 mg by mouth at bedtime.  10/03/17 09/24/18  Lucky Cowboy, MD  sucralfate (CARAFATE) 1 g tablet Take 1 tablet (1 g total) by mouth 4 (four) times daily as needed for up to 15 days. 01/21/18 02/11/18  Lucky Cowboy, MD  warfarin (COUMADIN) 2 MG tablet TAKE AS DIRECTED BY ANTICOAGULATION CLINIC. 01/16/18   Lars Masson, MD    Family History Family History  Problem Relation Age of Onset  . Diabetes Mother   . Stroke Mother   . Hypertension Brother   . Heart disease Brother   . Arthritis Sister        Rhematoid  . Arthritis Sister        Rhematoid    Social History Social History   Tobacco Use  . Smoking status: Never Smoker  . Smokeless tobacco: Never Used  Substance Use Topics  . Alcohol use: No  . Drug use: No     Allergies   Ace inhibitors; Fosamax [alendronate sodium]; Norvasc [amlodipine besylate]; and Zocor [simvastatin]   Review of Systems Review of Systems  Constitutional: Negative for fever.  Gastrointestinal: Positive for abdominal pain, nausea and vomiting. Negative for melena.  All other systems reviewed and are negative.    Physical Exam Updated Vital Signs BP (!) 176/111 (BP Location: Right Arm)   Pulse 91   Temp (!) 97.3 F (36.3 C) (Oral)   Resp 19   SpO2 93%   Physical Exam  Constitutional: She is oriented to person, place, and time. She appears well-developed and well-nourished. No distress.  Patient is an elderly female in no acute distress.  HENT:  Head: Normocephalic and atraumatic.  Neck: Normal range of motion. Neck supple.  Cardiovascular: Normal rate and regular  rhythm. Exam reveals no gallop and no friction rub.  No murmur heard. Pulmonary/Chest: Effort normal and breath sounds normal. No respiratory distress. She has no wheezes.  Abdominal: Soft. Bowel sounds are normal. She exhibits no distension. There is tenderness in the epigastric area. There is no rigidity, no rebound and no guarding.  There is mild tenderness in the epigastric region.  Musculoskeletal: Normal range of motion.  Neurological: She is alert and oriented to person, place, and time.  Skin: Skin is warm and dry. She is not diaphoretic.  Nursing note and vitals reviewed.    ED Treatments / Results  Labs (all labs ordered are listed, but only abnormal results are displayed) Labs Reviewed  LIPASE, BLOOD  COMPREHENSIVE METABOLIC PANEL  CBC  URINALYSIS, ROUTINE W REFLEX MICROSCOPIC    EKG  None  Radiology No results found.  Procedures Procedures (including critical care time)  Medications Ordered in ED Medications  ondansetron (ZOFRAN) injection 4 mg (has no administration in time range)  sodium chloride 0.9 % bolus 500 mL (has no administration in time range)     Initial Impression / Assessment and Plan / ED Course  I have reviewed the triage vital signs and the nursing notes.  Pertinent labs & imaging results that were available during my care of the patient were reviewed by me and considered in my medical decision making (see chart for details).  Labs and CT scan are all unremarkable with the exception of the known large hiatal hernia.  Her abdominal exam is benign and she is feeling better after fluids and medications given in the ER.  I see no indication for admission.  She has had no further vomiting while in the ER.  She will be discharged, to return as needed.  Final Clinical Impressions(s) / ED Diagnoses   Final diagnoses:  None    ED Discharge Orders    None       Geoffery Lyons, MD 03/16/18 2307

## 2018-03-16 NOTE — ED Provider Notes (Signed)
[facility] EMERGENCY DEPARTMENT Provider Note   CSN: [id number] Arrival date & time: 03/15/18  2334     History   Chief Complaint Chief Complaint  Patient presents with  . Abdominal Pain    HPI Lisa Hopkins is a 84 y.o. female.   Abdominal Pain      Past Medical History:  Diagnosis Date  . Anxiety   . Aphasia as late effect of cerebrovascular accident   . Atrial fibrillation (HCC)   . CKD (chronic kidney disease), stage II   . CVA (cerebral infarction)   . Diabetes mellitus type II   . GERD (gastroesophageal reflux disease)   . Hiatal hernia   . HTN (hypertension)   . Hyperlipidemia   . IBS (irritable bowel syndrome)   . Stroke (HCC)   . Vitamin D deficiency     Patient Active Problem List   Diagnosis Date Noted  . Encounter for therapeutic drug monitoring 12/21/2017  . Hiatal hernia 12/19/2017  . CKD stage 2 due to type 2 diabetes mellitus (HCC) 08/02/2017  . Atherosclerosis of aorta (HCC) 12/03/2015  . GERD 09/25/2014  . Diastolic CHF (HCC) 08/04/2014  . Hyperlipidemia, mixed   . Vitamin D deficiency   . Anxiety state 03/15/2010  . Essential hypertension 03/15/2010  . Atrial fibrillation (HCC) 03/15/2010  . APHASIA DUE TO CEREBROVASCULAR DISEASE 03/15/2010  . History of CVA (cerebrovascular accident) 03/07/2010    Past Surgical History:  Procedure Laterality Date  . CATARACT EXTRACTION, BILATERAL    . CHOLECYSTECTOMY    . COLONOSCOPY  2005  . ESOPHAGOGASTRODUODENOSCOPY (EGD) WITH PROPOFOL N/A 11/28/2017   Procedure: ESOPHAGOGASTRODUODENOSCOPY (EGD) WITH PROPOFOL;  Surgeon: Graylin Shiver, MD;  Location: [facility] ENDOSCOPY;  Service: Endoscopy;  Laterality: N/A;  . TONSILLECTOMY    . UPPER GASTROINTESTINAL ENDOSCOPY  2005     OB History   None      Home Medications    Prior to Admission medications   Medication Sig Start Date End Date Taking? Authorizing Provider  Cholecalciferol (VITAMIN D) 2000 UNITS CAPS Take 2,000  Units by mouth daily.    Yes [provider]  CINNAMON PO Take 1 capsule by mouth daily.   Yes [provider]  dicyclomine (BENTYL) 20 MG tablet Take 1 tablet 3 x day if needed for nausea, bloating , cramping or diarrhea Patient taking differently: Take 20 mg by mouth 3 (three) times daily as needed for spasms.  06/24/15  Yes Lucky Cowboy, MD  furosemide (LASIX) 20 MG tablet Take 1 tablet (20 mg total) by mouth daily. 01/01/18  Yes Judd Gaudier, NP  losartan (COZAAR) 50 MG tablet Take 1/2-1 tab daily. Patient taking differently: Take 50 mg by mouth daily.  12/20/17  Yes Judd Gaudier, NP  Magnesium Hydroxide (MAGNESIA PO) Take 1 tablet by mouth daily.    Yes [provider]  metoprolol tartrate (LOPRESSOR) 25 MG tablet Take 0.5 tablets (12.5 mg total) by mouth 2 (two) times daily. 07/16/17 03/16/26 Yes Lucky Cowboy, MD  ondansetron (ZOFRAN ODT) 4 MG disintegrating tablet Take 1 tablet (4 mg total) by mouth every 8 (eight) hours as needed for nausea or vomiting. 11/29/17  Yes Rai, Ripudeep K, MD  pantoprazole (PROTONIX) 40 MG tablet Take 1 tablet daily for Heartburn & Acid Indigestion Patient taking differently: Take 40 mg by mouth daily.  02/27/18  Yes Lucky Cowboy, MD  potassium chloride (K-DUR) 10 MEQ tablet Take 1 tablet (10 mEq total) by mouth daily. 12/20/17  Yes Judd Gaudier, NP  QUEtiapine (SEROQUEL) 25 MG tablet Take 1 tablet at bedtime Patient taking differently: Take 12.5 mg by mouth at bedtime.  10/03/17 09/24/18 Yes Lucky Cowboy, MD  warfarin (COUMADIN) 2 MG tablet TAKE AS DIRECTED BY ANTICOAGULATION CLINIC. Patient taking differently: Take 1-2 mg by mouth See admin instructions. Take 1/2 tablet on Tuesday and Saturday then take 1 tablet all the other days 01/16/18  Yes Lars Masson, MD  sucralfate (CARAFATE) 1 g tablet Take 1 tablet (1 g total) by mouth 4 (four) times daily as needed for up to 15 days. Patient not taking: Reported on  03/16/2018 01/21/18 03/16/26  Lucky Cowboy, MD    Family History Family History  Problem Relation Age of Onset  . Diabetes Mother   . Stroke Mother   . Hypertension Brother   . Heart disease Brother   . Arthritis Sister        Rhematoid  . Arthritis Sister        Rhematoid    Social History Social History   Tobacco Use  . Smoking status: Never Smoker  . Smokeless tobacco: Never Used  Substance Use Topics  . Alcohol use: No  . Drug use: No     Allergies   Ace inhibitors; Fosamax [alendronate sodium]; Norvasc [amlodipine besylate]; and Zocor [simvastatin]   Review of Systems Review of Systems  Gastrointestinal: Positive for abdominal pain.     Physical Exam Updated Vital Signs BP (!) 176/111 (BP Location: Right Arm)   Pulse 91   Temp (!) 97.3 F (36.3 C) (Oral)   Resp 19   SpO2 93%   Physical Exam   ED Treatments / Results  Labs (all labs ordered are listed, but only abnormal results are displayed) Labs Reviewed  COMPREHENSIVE METABOLIC PANEL - Abnormal; Notable for the following components:      Result Value   Glucose, Bld 160 (*)    Alkaline Phosphatase 145 (*)    GFR calc non Af Amer 53 (*)    All other components within normal limits  CBC - Abnormal; Notable for the following components:   RBC 5.19 (*)    Hemoglobin 15.2 (*)    HCT 47.2 (*)    All other components within normal limits  LIPASE, BLOOD  URINALYSIS, ROUTINE W REFLEX MICROSCOPIC    EKG None  Radiology Ct Abdomen Pelvis W Contrast  Result Date: 03/16/2018 CLINICAL DATA:  Vomiting and nausea EXAM: CT ABDOMEN AND PELVIS WITH CONTRAST TECHNIQUE: Multidetector CT imaging of the abdomen and pelvis was performed using the standard protocol following bolus administration of intravenous contrast. CONTRAST:  OMNIPAQUE IOHEXOL 300 MG/ML  SOLN COMPARISON:  11/24/2017, 06/08/2014 FINDINGS: Lower chest: Large hiatal hernia with intrathoracic stomach. Heart size within normal limits.  Mild dependent atelectasis within the left lung base. Hepatobiliary: Status post cholecystectomy. Dilated common bile duct similar compared to prior. No focal hepatic abnormality. Pancreas: Unremarkable. No pancreatic ductal dilatation or surrounding inflammatory changes. Spleen: Normal in size without focal abnormality. Adrenals/Urinary Tract: Small cortical defect mid left kidney. No hydronephrosis. Adrenal glands are normal. The bladder is unremarkable Stomach/Bowel: Intrathoracic stomach which is fluid-filled. No dilated small bowel. No colon wall thickening. Sigmoid colon diverticular disease. Vascular/Lymphatic: Nonaneurysmal aorta. Mild aortic atherosclerosis. No significant adenopathy. Reproductive: Calcified uterine fibroids.  No adnexal masses. Other: Negative for free air or free fluid. Musculoskeletal: Degenerative changes of the spine. No acute or suspicious abnormality. IMPRESSION: 1. Large hiatal hernia with intrathoracic stomach. No evidence for  a small bowel obstruction. 2. Status post cholecystectomy with biliary dilatation, unchanged and likely due to surgical change. 3. Calcified uterine fibroids. Electronically Signed   By: Jasmine Pang M.D.   On: 03/16/2018 01:52    Procedures Procedures (including critical care time)  Medications Ordered in ED Medications  ondansetron (ZOFRAN) injection 4 mg (4 mg Intravenous Given 03/16/18 0022)  sodium chloride 0.9 % bolus 500 mL (500 mLs Intravenous New Bag/Given 03/16/18 0135)  iohexol (OMNIPAQUE) 300 MG/ML solution 100 mL (100 mLs Intravenous Contrast Given 03/16/18 0110)     Initial Impression / Assessment and Plan / ED Course  I have reviewed the triage vital signs and the nursing notes.  Pertinent labs & imaging results that were available during my care of the patient were reviewed by me and considered in my medical decision making (see chart for details).  Patient is a 84 year old female with history of large hiatal hernia  presenting with nausea and vomiting.  This came on after eating at her extended care facility.  She vomited multiple times, however now is feeling better.  She received Zofran.  She is denying any pain.  Her work-up shows no elevation of white count and no other significant laboratory abnormalities.  CT scan is negative for any acute intra-abdominal process.  It does show a large hiatal hernia with no evidence for obstruction or blockage.  She is feeling better and resting comfortably.  I see no indication for admission and believe she is appropriate for discharge.  Final Clinical Impressions(s) / ED Diagnoses   Final diagnoses:  None    ED Discharge Orders    None       Geoffery Lyons, MD 03/16/18 [phone]

## 2018-03-19 ENCOUNTER — Other Ambulatory Visit: Payer: Self-pay | Admitting: Internal Medicine

## 2018-03-19 DIAGNOSIS — K449 Diaphragmatic hernia without obstruction or gangrene: Secondary | ICD-10-CM | POA: Diagnosis not present

## 2018-03-19 DIAGNOSIS — R112 Nausea with vomiting, unspecified: Secondary | ICD-10-CM | POA: Diagnosis not present

## 2018-03-19 DIAGNOSIS — R633 Feeding difficulties: Secondary | ICD-10-CM | POA: Diagnosis not present

## 2018-03-21 ENCOUNTER — Observation Stay (HOSPITAL_COMMUNITY)
Admission: EM | Admit: 2018-03-21 | Discharge: 2018-03-23 | Disposition: A | Discharge status: Home / Self Care | Payer: Medicare Other | Attending: Internal Medicine | Admitting: Internal Medicine

## 2018-03-21 ENCOUNTER — Emergency Department (HOSPITAL_COMMUNITY): Payer: Medicare Other

## 2018-03-21 ENCOUNTER — Encounter (HOSPITAL_COMMUNITY): Payer: Self-pay | Admitting: Internal Medicine

## 2018-03-21 ENCOUNTER — Observation Stay (HOSPITAL_COMMUNITY): Payer: Medicare Other

## 2018-03-21 DIAGNOSIS — G9389 Other specified disorders of brain: Secondary | ICD-10-CM | POA: Diagnosis not present

## 2018-03-21 DIAGNOSIS — I4891 Unspecified atrial fibrillation: Secondary | ICD-10-CM | POA: Diagnosis present

## 2018-03-21 DIAGNOSIS — S99911A Unspecified injury of right ankle, initial encounter: Secondary | ICD-10-CM | POA: Diagnosis not present

## 2018-03-21 DIAGNOSIS — Z888 Allergy status to other drugs, medicaments and biological substances status: Secondary | ICD-10-CM | POA: Diagnosis not present

## 2018-03-21 DIAGNOSIS — Z833 Family history of diabetes mellitus: Secondary | ICD-10-CM | POA: Insufficient documentation

## 2018-03-21 DIAGNOSIS — R55 Syncope and collapse: Principal | ICD-10-CM

## 2018-03-21 DIAGNOSIS — Z66 Do not resuscitate: Secondary | ICD-10-CM | POA: Insufficient documentation

## 2018-03-21 DIAGNOSIS — Z8673 Personal history of transient ischemic attack (TIA), and cerebral infarction without residual deficits: Secondary | ICD-10-CM

## 2018-03-21 DIAGNOSIS — S299XXA Unspecified injury of thorax, initial encounter: Secondary | ICD-10-CM | POA: Diagnosis not present

## 2018-03-21 DIAGNOSIS — W19XXXA Unspecified fall, initial encounter: Secondary | ICD-10-CM | POA: Insufficient documentation

## 2018-03-21 DIAGNOSIS — R2689 Other abnormalities of gait and mobility: Secondary | ICD-10-CM | POA: Diagnosis not present

## 2018-03-21 DIAGNOSIS — E782 Mixed hyperlipidemia: Secondary | ICD-10-CM | POA: Insufficient documentation

## 2018-03-21 DIAGNOSIS — I5030 Unspecified diastolic (congestive) heart failure: Secondary | ICD-10-CM | POA: Diagnosis present

## 2018-03-21 DIAGNOSIS — Z8249 Family history of ischemic heart disease and other diseases of the circulatory system: Secondary | ICD-10-CM | POA: Insufficient documentation

## 2018-03-21 DIAGNOSIS — I82451 Acute embolism and thrombosis of right peroneal vein: Secondary | ICD-10-CM | POA: Diagnosis not present

## 2018-03-21 DIAGNOSIS — Z823 Family history of stroke: Secondary | ICD-10-CM | POA: Insufficient documentation

## 2018-03-21 DIAGNOSIS — M6281 Muscle weakness (generalized): Secondary | ICD-10-CM | POA: Insufficient documentation

## 2018-03-21 DIAGNOSIS — Z9842 Cataract extraction status, left eye: Secondary | ICD-10-CM | POA: Diagnosis not present

## 2018-03-21 DIAGNOSIS — R197 Diarrhea, unspecified: Secondary | ICD-10-CM | POA: Diagnosis not present

## 2018-03-21 DIAGNOSIS — Z9841 Cataract extraction status, right eye: Secondary | ICD-10-CM | POA: Insufficient documentation

## 2018-03-21 DIAGNOSIS — Z7901 Long term (current) use of anticoagulants: Secondary | ICD-10-CM | POA: Insufficient documentation

## 2018-03-21 DIAGNOSIS — Z9889 Other specified postprocedural states: Secondary | ICD-10-CM | POA: Diagnosis not present

## 2018-03-21 DIAGNOSIS — K219 Gastro-esophageal reflux disease without esophagitis: Secondary | ICD-10-CM | POA: Diagnosis not present

## 2018-03-21 DIAGNOSIS — M542 Cervicalgia: Secondary | ICD-10-CM | POA: Diagnosis not present

## 2018-03-21 DIAGNOSIS — I130 Hypertensive heart and chronic kidney disease with heart failure and stage 1 through stage 4 chronic kidney disease, or unspecified chronic kidney disease: Secondary | ICD-10-CM | POA: Insufficient documentation

## 2018-03-21 DIAGNOSIS — K589 Irritable bowel syndrome without diarrhea: Secondary | ICD-10-CM | POA: Insufficient documentation

## 2018-03-21 DIAGNOSIS — N182 Chronic kidney disease, stage 2 (mild): Secondary | ICD-10-CM | POA: Diagnosis not present

## 2018-03-21 DIAGNOSIS — E559 Vitamin D deficiency, unspecified: Secondary | ICD-10-CM | POA: Insufficient documentation

## 2018-03-21 DIAGNOSIS — I10 Essential (primary) hypertension: Secondary | ICD-10-CM | POA: Diagnosis present

## 2018-03-21 DIAGNOSIS — Z79899 Other long term (current) drug therapy: Secondary | ICD-10-CM | POA: Diagnosis not present

## 2018-03-21 DIAGNOSIS — S0990XA Unspecified injury of head, initial encounter: Secondary | ICD-10-CM | POA: Diagnosis not present

## 2018-03-21 DIAGNOSIS — R52 Pain, unspecified: Secondary | ICD-10-CM

## 2018-03-21 DIAGNOSIS — I69320 Aphasia following cerebral infarction: Secondary | ICD-10-CM | POA: Insufficient documentation

## 2018-03-21 DIAGNOSIS — Z9049 Acquired absence of other specified parts of digestive tract: Secondary | ICD-10-CM | POA: Diagnosis not present

## 2018-03-21 DIAGNOSIS — K449 Diaphragmatic hernia without obstruction or gangrene: Secondary | ICD-10-CM | POA: Diagnosis not present

## 2018-03-21 DIAGNOSIS — E1122 Type 2 diabetes mellitus with diabetic chronic kidney disease: Secondary | ICD-10-CM | POA: Insufficient documentation

## 2018-03-21 DIAGNOSIS — M25571 Pain in right ankle and joints of right foot: Secondary | ICD-10-CM | POA: Diagnosis not present

## 2018-03-21 LAB — CBC WITH DIFFERENTIAL/PLATELET
ABS IMMATURE GRANULOCYTES: 0.02 10*3/uL (ref 0.00–0.07)
BASOS PCT: 1 %
Basophils Absolute: 0 10*3/uL (ref 0.0–0.1)
EOS ABS: 0.1 10*3/uL (ref 0.0–0.5)
EOS PCT: 1 %
HCT: 40.4 % (ref 36.0–46.0)
Hemoglobin: 12.7 g/dL (ref 12.0–15.0)
Immature Granulocytes: 0 %
Lymphocytes Relative: 22 %
Lymphs Abs: 1.7 10*3/uL (ref 0.7–4.0)
MCH: 29.1 pg (ref 26.0–34.0)
MCHC: 31.4 g/dL (ref 30.0–36.0)
MCV: 92.7 fL (ref 80.0–100.0)
MONO ABS: 0.5 10*3/uL (ref 0.1–1.0)
MONOS PCT: 7 %
NEUTROS ABS: 5.4 10*3/uL (ref 1.7–7.7)
Neutrophils Relative %: 69 %
PLATELETS: 227 10*3/uL (ref 150–400)
RBC: 4.36 MIL/uL (ref 3.87–5.11)
RDW: 13.5 % (ref 11.5–15.5)
WBC: 7.7 10*3/uL (ref 4.0–10.5)
nRBC: 0 % (ref 0.0–0.2)

## 2018-03-21 LAB — URINALYSIS, ROUTINE W REFLEX MICROSCOPIC
BILIRUBIN URINE: NEGATIVE
Glucose, UA: NEGATIVE mg/dL
KETONES UR: NEGATIVE mg/dL
Leukocytes, UA: NEGATIVE
Nitrite: NEGATIVE
PH: 7 (ref 5.0–8.0)
Protein, ur: NEGATIVE mg/dL
SPECIFIC GRAVITY, URINE: 1.009 (ref 1.005–1.030)

## 2018-03-21 LAB — COMPREHENSIVE METABOLIC PANEL
ALBUMIN: 3.2 g/dL — AB (ref 3.5–5.0)
ALK PHOS: 101 U/L (ref 38–126)
ALT: 15 U/L (ref 0–44)
ANION GAP: 8 (ref 5–15)
AST: 30 U/L (ref 15–41)
BUN: 12 mg/dL (ref 8–23)
CALCIUM: 8.8 mg/dL — AB (ref 8.9–10.3)
CHLORIDE: 107 mmol/L (ref 98–111)
CO2: 24 mmol/L (ref 22–32)
Creatinine, Ser: 0.88 mg/dL (ref 0.44–1.00)
GFR calc non Af Amer: 56 mL/min — ABNORMAL LOW (ref >60)
GLUCOSE: 125 mg/dL — AB (ref 70–99)
Potassium: 4.3 mmol/L (ref 3.5–5.1)
Sodium: 139 mmol/L (ref 135–145)
TOTAL PROTEIN: 6.5 g/dL (ref 6.5–8.1)
Total Bilirubin: 1.5 mg/dL — ABNORMAL HIGH (ref 0.3–1.2)

## 2018-03-21 LAB — PROTIME-INR
INR: 1.73
PROTHROMBIN TIME: 20 s — AB (ref 11.4–15.2)

## 2018-03-21 LAB — TROPONIN I: Troponin I: <0.03 ng/mL (ref <0.03)

## 2018-03-21 LAB — CBG MONITORING, ED: GLUCOSE-CAPILLARY: 114 mg/dL — AB (ref 70–99)

## 2018-03-21 MED ORDER — QUETIAPINE FUMARATE 25 MG PO TABS
12.5000 mg | ORAL_TABLET | Freq: Every day | ORAL | Status: DC
  Administered 2018-03-21 – 2018-03-22 (×2): 12.5 mg via ORAL
  Filled 2018-03-21 (×2): qty 1

## 2018-03-21 MED ORDER — LOSARTAN POTASSIUM 25 MG PO TABS: 25.0000 mg | ORAL_TABLET | Freq: Every day | ORAL | Status: DC

## 2018-03-21 MED ORDER — VITAMIN D3 25 MCG (1000 UNIT) PO TABS
2000.0000 [IU] | ORAL_TABLET | Freq: Every day | ORAL | Status: DC
  Administered 2018-03-22 – 2018-03-23 (×2): 2000 [IU] via ORAL
  Filled 2018-03-21 (×4): qty 2

## 2018-03-21 MED ORDER — ACETAMINOPHEN 650 MG RE SUPP: 650.0000 mg | Freq: Four times a day (QID) | RECTAL | Status: DC | PRN

## 2018-03-21 MED ORDER — WARFARIN SODIUM 1 MG PO TABS
1.0000 mg | ORAL_TABLET | Freq: Once | ORAL | Status: AC
  Administered 2018-03-21: 1 mg via ORAL
  Filled 2018-03-21 (×2): qty 1

## 2018-03-21 MED ORDER — SODIUM CHLORIDE 0.9 % IV BOLUS
1000.0000 mL | Freq: Once | INTRAVENOUS | Status: AC
  Administered 2018-03-21: 1000 mL via INTRAVENOUS

## 2018-03-21 MED ORDER — ONDANSETRON HCL 4 MG/2ML IJ SOLN: 4.0000 mg | Freq: Four times a day (QID) | INTRAMUSCULAR | Status: DC | PRN

## 2018-03-21 MED ORDER — ACETAMINOPHEN 325 MG PO TABS: 650.0000 mg | ORAL_TABLET | Freq: Four times a day (QID) | ORAL | Status: DC | PRN

## 2018-03-21 MED ORDER — HYDRALAZINE HCL 20 MG/ML IJ SOLN: 5.0000 mg | Freq: Three times a day (TID) | INTRAMUSCULAR | Status: DC | PRN

## 2018-03-21 MED ORDER — PANTOPRAZOLE SODIUM 40 MG PO TBEC
40.0000 mg | DELAYED_RELEASE_TABLET | Freq: Every day | ORAL | Status: DC
  Administered 2018-03-22 – 2018-03-23 (×2): 40 mg via ORAL
  Filled 2018-03-21 (×2): qty 1

## 2018-03-21 MED ORDER — SODIUM CHLORIDE 0.9 % IV SOLN
INTRAVENOUS | Status: DC
  Administered 2018-03-21: 17:00:00 via INTRAVENOUS

## 2018-03-21 MED ORDER — SUCRALFATE 1 G PO TABS: 1.0000 g | ORAL_TABLET | Freq: Four times a day (QID) | ORAL | Status: DC | PRN

## 2018-03-21 MED ORDER — ONDANSETRON HCL 4 MG PO TABS: 4.0000 mg | ORAL_TABLET | Freq: Four times a day (QID) | ORAL | Status: DC | PRN

## 2018-03-21 MED ORDER — WARFARIN - PHARMACIST DOSING INPATIENT
Freq: Every day | Status: DC
  Administered 2018-03-22: 18:00:00

## 2018-03-21 MED ORDER — HYDRALAZINE HCL 20 MG/ML IJ SOLN: 10.0000 mg | Freq: Three times a day (TID) | INTRAMUSCULAR | Status: DC | PRN

## 2018-03-21 MED ORDER — METOPROLOL TARTRATE 12.5 MG HALF TABLET
12.5000 mg | ORAL_TABLET | Freq: Two times a day (BID) | ORAL | Status: DC
  Administered 2018-03-21 – 2018-03-23 (×4): 12.5 mg via ORAL
  Filled 2018-03-21 (×4): qty 1

## 2018-03-21 NOTE — ED Provider Notes (Signed)
[facility] 3E CHF Provider Note   CSN: [id number] Arrival date & time: 03/21/18  4782     History   Chief Complaint Chief Complaint  Patient presents with  . Fall  . Atrial Fibrillation    HPI Lisa Hopkins is a 84 y.o. female.  HPI   84 year old female with a history of atrial fibrillation on Coumadin, CKD, CVA, diabetes, hypertension, hyperlipidemia who presents with concern for syncope and fall.  Patient had been seen in the emergency department last Friday for nausea and vomiting, had improved however developed symptoms again of vomiting on Sunday night.  Reports since then, she has not had additional episodes of vomiting, has been tolerating p.o.  Family members report that she frequently reports diarrhea, although they are not sure chronicity or severity of this.  Patient reports today she had a small breakfast, is watching walking from the kitchen to the bathroom, sat down on the commode, and had an episode of syncope.  Reports she believes she hit her head on the commode.  Reports she is not sure how long she was out.  Reports she feels a bit lightheaded now.  Denies chest pain, shortness of breath, asymmetric leg swelling, fevers, black or bloody stools.  Family notes that she had normal stool in the commode, however had diarrhea next to it where she had the syncopal event. No focal weakness or numbness.   Past Medical History:  Diagnosis Date  . Anxiety   . Aphasia as late effect of cerebrovascular accident   . Atrial fibrillation (HCC)   . CKD (chronic kidney disease), stage II   . CVA (cerebral infarction)   . Diabetes mellitus type II   . GERD (gastroesophageal reflux disease)   . Hiatal hernia   . HTN (hypertension)   . Hyperlipidemia   . IBS (irritable bowel syndrome)   . Stroke (HCC)   . Vitamin D deficiency     Patient Active Problem List   Diagnosis Date Noted  . Syncope 03/21/2018  . Encounter for therapeutic drug monitoring  12/21/2017  . Hiatal hernia 12/19/2017  . CKD stage 2 due to type 2 diabetes mellitus (HCC) 08/02/2017  . Atherosclerosis of aorta (HCC) 12/03/2015  . GERD 09/25/2014  . Diastolic CHF (HCC) 08/04/2014  . Hyperlipidemia, mixed   . Vitamin D deficiency   . Anxiety state 03/15/2010  . Essential hypertension 03/15/2010  . Atrial fibrillation (HCC) 03/15/2010  . APHASIA DUE TO CEREBROVASCULAR DISEASE 03/15/2010  . History of CVA (cerebrovascular accident) 03/07/2010    Past Surgical History:  Procedure Laterality Date  . CATARACT EXTRACTION, BILATERAL    . CHOLECYSTECTOMY    . COLONOSCOPY  2005  . ESOPHAGOGASTRODUODENOSCOPY (EGD) WITH PROPOFOL N/A 11/28/2017   Procedure: ESOPHAGOGASTRODUODENOSCOPY (EGD) WITH PROPOFOL;  Surgeon: Graylin Shiver, MD;  Location: [facility] ENDOSCOPY;  Service: Endoscopy;  Laterality: N/A;  . TONSILLECTOMY    . UPPER GASTROINTESTINAL ENDOSCOPY  2005     OB History   None      Home Medications    Prior to Admission medications   Medication Sig Start Date End Date Taking? Authorizing Provider  Cholecalciferol (VITAMIN D) 2000 UNITS CAPS Take 2,000 Units by mouth daily.    Yes [provider]  CINNAMON PO Take 1 capsule by mouth daily.   Yes [provider]  dicyclomine (BENTYL) 20 MG tablet Take 1 tablet 3 x day if needed for nausea, bloating , cramping or diarrhea Patient taking differently: Take 20  mg by mouth 3 (three) times daily as needed for spasms.  06/24/15  Yes Lucky Cowboy, MD  furosemide (LASIX) 20 MG tablet Take 1 tablet (20 mg total) by mouth daily. 01/01/18  Yes Judd Gaudier, NP  losartan (COZAAR) 50 MG tablet Take 1/2-1 tab daily. Patient taking differently: Take 25 mg by mouth daily.  12/20/17  Yes Judd Gaudier, NP  Magnesium Hydroxide (MAGNESIA PO) Take 1 tablet by mouth daily.    Yes [provider]  metoprolol tartrate (LOPRESSOR) 25 MG tablet Take 0.5 tablets (12.5 mg total) by mouth 2 (two) times daily.  07/16/17 03/16/26 Yes Lucky Cowboy, MD  ondansetron (ZOFRAN ODT) 4 MG disintegrating tablet Take 1 tablet (4 mg total) by mouth every 8 (eight) hours as needed for nausea or vomiting. 11/29/17  Yes Rai, Ripudeep K, MD  pantoprazole (PROTONIX) 40 MG tablet Take 1 tablet daily for Heartburn & Acid Indigestion Patient taking differently: Take 40 mg by mouth daily.  02/27/18  Yes Lucky Cowboy, MD  potassium chloride (K-DUR) 10 MEQ tablet Take 1 tablet (10 mEq total) by mouth daily. Patient taking differently: Take 10 mEq by mouth every evening.  12/20/17  Yes Judd Gaudier, NP  QUEtiapine (SEROQUEL) 25 MG tablet Take 1 tablet at bedtime Patient taking differently: Take 25 mg by mouth at bedtime.  10/03/17 09/24/18 Yes Lucky Cowboy, MD  warfarin (COUMADIN) 2 MG tablet TAKE AS DIRECTED BY ANTICOAGULATION CLINIC. Patient taking differently: Take 1-2 mg by mouth See admin instructions. Take 1 mg tablet on Tuesday and Thursday then take 2 mg tablet all the other days. Take in the am 01/16/18  Yes Lars Masson, MD  sucralfate (CARAFATE) 1 g tablet Take 1 tablet (1 g total) by mouth 4 (four) times daily as needed for up to 15 days. Patient taking differently: Take 1 g by mouth as needed.  01/21/18 03/16/26  Lucky Cowboy, MD    Family History Family History  Problem Relation Age of Onset  . Diabetes Mother   . Stroke Mother   . Hypertension Brother   . Heart disease Brother   . Arthritis Sister        Rhematoid  . Arthritis Sister        Rhematoid    Social History Social History   Tobacco Use  . Smoking status: Never Smoker  . Smokeless tobacco: Never Used  Substance Use Topics  . Alcohol use: No  . Drug use: No     Allergies   Ace inhibitors; Fosamax [alendronate sodium]; Norvasc [amlodipine besylate]; and Zocor [simvastatin]   Review of Systems Review of Systems  Constitutional: Negative for fever.  HENT: Negative for sore throat.   Eyes: Negative for visual  disturbance.  Respiratory: Negative for cough and shortness of breath.   Cardiovascular: Negative for chest pain.  Gastrointestinal: Positive for diarrhea. Negative for abdominal pain, blood in stool, constipation, nausea and vomiting.  Genitourinary: Negative for difficulty urinating.  Musculoskeletal: Positive for neck pain (anterior left). Negative for back pain.  Skin: Negative for rash.  Neurological: Positive for syncope and light-headedness. Negative for facial asymmetry, weakness, numbness and headaches.     Physical Exam Updated Vital Signs BP (!) 172/78   Pulse 74   Temp 98.1 F (36.7 C) (Oral)   Resp 15   Ht 5\' 3"  (1.6 m)   SpO2 97%   BMI 20.65 kg/m   Physical Exam  Constitutional: She is oriented to person, place, and time. She appears well-developed and well-nourished.  No distress.  HENT:  Head: Normocephalic and atraumatic.  Eyes: Conjunctivae and EOM are normal.  Neck: Normal range of motion.  Cardiovascular: Normal rate, normal heart sounds and intact distal pulses. An irregularly irregular rhythm present. Exam reveals no gallop and no friction rub.  No murmur heard. Pulmonary/Chest: Effort normal and breath sounds normal. No respiratory distress. She has no wheezes. She has no rales.  Abdominal: Soft. She exhibits no distension. There is no tenderness. There is no guarding.  Musculoskeletal: She exhibits no edema or tenderness.       Cervical back: She exhibits no bony tenderness.       Thoracic back: She exhibits no bony tenderness.       Lumbar back: She exhibits no bony tenderness.  Neurological: She is alert and oriented to person, place, and time. She has normal strength. No cranial nerve deficit or sensory deficit. Coordination normal. GCS eye subscore is 4. GCS verbal subscore is 5. GCS motor subscore is 6.  Skin: Skin is warm and dry. No rash noted. She is not diaphoretic. No erythema.  Nursing note and vitals reviewed.    ED Treatments / Results    Labs (all labs ordered are listed, but only abnormal results are displayed) Labs Reviewed  URINALYSIS, ROUTINE W REFLEX MICROSCOPIC - Abnormal; Notable for the following components:      Result Value   Hgb urine dipstick SMALL (*)    Bacteria, UA RARE (*)    All other components within normal limits  COMPREHENSIVE METABOLIC PANEL - Abnormal; Notable for the following components:   Glucose, Bld 125 (*)    Calcium 8.8 (*)    Albumin 3.2 (*)    Total Bilirubin 1.5 (*)    GFR calc non Af Amer 56 (*)    All other components within normal limits  PROTIME-INR - Abnormal; Notable for the following components:   Prothrombin Time 20.0 (*)    All other components within normal limits  CBG MONITORING, ED - Abnormal; Notable for the following components:   Glucose-Capillary 114 (*)    All other components within normal limits  URINE CULTURE  CBC WITH DIFFERENTIAL/PLATELET  TROPONIN I  TROPONIN I  TROPONIN I  COMPREHENSIVE METABOLIC PANEL  CBC    EKG EKG Interpretation  Date/Time:  Thursday March 21 2018 10:07:43 EDT Ventricular Rate:  68 PR Interval:    QRS Duration: 103 QT Interval:  432 QTC Calculation: 460 R Axis:   100 Text Interpretation:  Atrial fibrillation Right axis deviation Since prior ECG, rate has slowed Confirmed by Alvira Monday ([id number]) on 03/21/2018 12:44:00 PM   Radiology Dg Chest 2 View  Result Date: 03/21/2018 CLINICAL DATA:  Larey Seat syncopal episode EXAM: CHEST - 2 VIEW COMPARISON:  Portable chest x-ray of 12/02/2016 FINDINGS: The opacity at the left lung base appears to be due to a very large hiatal hernia with some volume loss at the left lung base. No pneumonia or effusion is seen. Cardiomegaly is stable. The bones are diffusely osteopenic. IMPRESSION: 1. Large hiatal hernia. 2. Stable cardiomegaly. Electronically Signed   By: Dwyane Dee M.D.   On: 03/21/2018 12:15   Dg Tibia/fibula Right  Result Date: 03/21/2018 CLINICAL DATA:  Right ankle pain  after fall. EXAM: RIGHT TIBIA AND FIBULA - 2 VIEW COMPARISON:  None. FINDINGS: There is no evidence of fracture or other focal bone lesions. Soft tissues are unremarkable. IMPRESSION: Negative. Electronically Signed   By: Lupita Raider, M.D.   On: 03/21/2018  15:27   Ct Head Wo Contrast  Result Date: 03/21/2018 CLINICAL DATA:  Unwitnessed fall. EXAM: CT HEAD WITHOUT CONTRAST TECHNIQUE: Contiguous axial images were obtained from the base of the skull through the vertex without intravenous contrast. COMPARISON:  Head CT 04/14/2017 FINDINGS: Brain: Chronic findings of encephalomalacia in the periventricular LEFT frontal lobe and RIGHT cerebellum. Chronic atrophy and periventricular white matter disease. No acute intracranial hemorrhage. No focal mass lesion. No CT evidence of acute infarction. No midline shift or mass effect. No hydrocephalus. Basilar cisterns are patent. Vascular: No hyperdense vessel or unexpected calcification. Skull: Normal. Negative for fracture or focal lesion. Sinuses/Orbits: Paranasal sinuses and mastoid air cells are clear. Orbits are clear. Other: None. IMPRESSION: 1. No acute intracranial findings.  No evidence trauma. 2. Chronic infarctions as above. Electronically Signed   By: Genevive Bi M.D.   On: 03/21/2018 12:18   Mr Brain Wo Contrast  Result Date: 03/21/2018 CLINICAL DATA:  84 y/o  F; syncope and unwitnessed fall. EXAM: MRI HEAD WITHOUT CONTRAST TECHNIQUE: Multiplanar, multiecho pulse sequences of the brain and surrounding structures were obtained without intravenous contrast. COMPARISON:  03/21/2018 CT head. FINDINGS: Brain: No acute infarction, hemorrhage, hydrocephalus, extra-axial collection or mass lesion. Right inferior cerebellar chronic infarction. Chronic left anterolateral frontal, left anterior basal ganglia, and anterior insula infarction. Small chronic lacunar infarct within the left hemi pons. Patchy nonspecific T2 FLAIR hyperintensities in subcortical  and periventricular white matter as well as the pons are compatible with moderate chronic microvascular ischemic changes for age. Moderate volume loss of the brain. Punctate focus of susceptibility hypointensity within the right inferior cerebellum is compatible with hemosiderin deposition of chronic microhemorrhage. Vascular: Normal flow voids. Skull and upper cervical spine: Normal marrow signal. Sinuses/Orbits: Left frontal sinus mucosal thickening. No additional abnormal signal of the paranasal sinuses or mastoid air cells. Bilateral intra-ocular lens replacement. Other: None. IMPRESSION: 1. No acute intracranial abnormality identified. 2. Stable chronic infarcts, volume loss, and microvascular ischemic changes of the brain as above. Electronically Signed   By: Mitzi Hansen M.D.   On: 03/21/2018 16:31    Procedures Procedures (including critical care time)  Medications Ordered in ED Medications  hydrALAZINE (APRESOLINE) injection 5 mg (has no administration in time range)  0.9 %  sodium chloride infusion ( Intravenous New Bag/Given 03/21/18 1710)  acetaminophen (TYLENOL) tablet 650 mg (has no administration in time range)    Or  acetaminophen (TYLENOL) suppository 650 mg (has no administration in time range)  ondansetron (ZOFRAN) tablet 4 mg (has no administration in time range)    Or  ondansetron (ZOFRAN) injection 4 mg (has no administration in time range)  Warfarin - Pharmacist Dosing Inpatient (has no administration in time range)  warfarin (COUMADIN) tablet 1 mg (has no administration in time range)  losartan (COZAAR) tablet 25 mg (has no administration in time range)  metoprolol tartrate (LOPRESSOR) tablet 12.5 mg (has no administration in time range)  QUEtiapine (SEROQUEL) tablet 12.5 mg (has no administration in time range)  pantoprazole (PROTONIX) EC tablet 40 mg (has no administration in time range)  sucralfate (CARAFATE) tablet 1 g (has no administration in time  range)  cholecalciferol (VITAMIN D) tablet 2,000 Units (has no administration in time range)  sodium chloride 0.9 % bolus 1,000 mL (0 mLs Intravenous Stopped 03/21/18 1517)     Initial Impression / Assessment and Plan / ED Course  I have reviewed the triage vital signs and the nursing notes.  Pertinent labs & imaging results that  were available during my care of the patient were reviewed by me and considered in my medical decision making (see chart for details).     84 year old female with a history of atrial fibrillation on Coumadin, CKD, CVA, diabetes, hypertension, hyperlipidemia who presents with concern for syncope and fall.  No chest pain or dyspnea, doubt PE, ACS as etiology of syncopal event. No focal neurologic symptoms or findings on exam, doubt CVA.  No hx to suggest GIB.  Hgb, electrolytes WNL. EKG with atrial fibrillation.  Pt reports she just had episode of syncope without warning, and while she was using commode it is not clearly vasovagal by history. Will admit for observation given risk factors, age, her report of syncope without prodrome. DDx includes arrhythmia, dehydration, vasovagal.   Final Clinical Impressions(s) / ED Diagnoses   Final diagnoses:  Syncope, unspecified syncope type    ED Discharge Orders    None       Alvira Monday, MD 03/21/18 1819

## 2018-03-21 NOTE — Progress Notes (Signed)
ANTICOAGULATION CONSULT NOTE - Initial Consult  Pharmacy Consult for warfarin Indication: atrial fibrillation  Allergies  Allergen Reactions  . Ace Inhibitors Other (See Comments)    Cough  . Fosamax [Alendronate Sodium] Other (See Comments)    Heart burn  . Norvasc [Amlodipine Besylate] Other (See Comments)    edema  . Zocor [Simvastatin] Other (See Comments)    Elevated CPK    Patient Measurements:   Heparin Dosing Weight:   Vital Signs: Temp: 96 F (35.6 C) (10/24 1015) Temp Source: Rectal (10/24 1015) BP: 176/84 (10/24 1400) Pulse Rate: 66 (10/24 1400)  Labs: Recent Labs    03/21/18 1041 03/21/18 1340  HGB 12.7  --   HCT 40.4  --   PLT 227  --   LABPROT  --  20.0*  INR  --  1.73  CREATININE 0.88  --     CrCl cannot be calculated (Unknown ideal weight.).   Medical History: Past Medical History:  Diagnosis Date  . Anxiety   . Aphasia as late effect of cerebrovascular accident   . Atrial fibrillation (HCC)   . CKD (chronic kidney disease), stage II   . CVA (cerebral infarction)   . Diabetes mellitus type II   . GERD (gastroesophageal reflux disease)   . Hiatal hernia   . HTN (hypertension)   . Hyperlipidemia   . IBS (irritable bowel syndrome)   . Stroke (HCC)   . Vitamin D deficiency     Assessment: 60 yof presented to the ED after a fall. She is on chronic warfarin for history of afib. INR is subtherapeutic. No bleeding noted. To resume warfarin.   Goal of Therapy:  INR 2-3 Monitor platelets by anticoagulation protocol: Yes   Plan:  Warfarin 1mg  PO x 1 tonight - give in addition to her home dose that she took this AM Daily INR  Lisa Hopkins, Drake Leach 03/21/2018,2:45 PM

## 2018-03-21 NOTE — ED Triage Notes (Addendum)
Pt here from [facility] after an unwitnessed fall this morning in her bathroom. Pt denies hitting her head, she is on blood thinners for a-fib. Per pt, she "passed out and had diarrhea all over the commode."  No obvious injuries or pain. Given 50ml NS by EMS. VSS at this time. A/Ox3.

## 2018-03-21 NOTE — H&P (Signed)
History and Physical    TAMBI THOLE ZOX:[id number] DOB: 01/17/1925 DOA: 03/21/2018  PCP: Lucky Cowboy, MD  Patient coming from: [place]  I have personally briefly reviewed patient's old medical records in [facility]  Chief Complaint: Pass out   HPI: ASHLLEY BOOHER is a 84 y.o. female with medical history significant of large hiatal hernia, prior admission for nausea vomiting, exacerbation of her hiatal hernia defect, A fib on coumadin, prior CVA, HTN who presents after syncope episode. Patient relates she wake up this morning, had breakfast and sat in her recliner to sleep. She wake up, felt funny and went to bathroom. She sat down , had BM and subsequently pass out. She denies chest pain, dyspnea, or palpitation prior to episode. Of note patient was seen in the ED last week with nausea, vomiting and diarrhea. She received fluids, CT abdomen negative for acute finding. She was discharge. She has been able to eat. No further vomiting episodes. She h=did had watery stool episode after syncope,.   She is complaining of right calf pain. She denies chest pain. She denies dyspnea. She is a some what  sleepy, tired.  Per family, patient had syncope when she had stroke.   ED Course: sodium 139, k 4.3, bili 1.5, hb 12, wbc at 7.7, INR 1.7. CT head no acute bleed. UA 0-5, chest x ray stable cardiomegaly.   Review of Systems: As per HPI otherwise 10 point review of systems negative.    Past Medical History:  Diagnosis Date  . Anxiety   . Aphasia as late effect of cerebrovascular accident   . Atrial fibrillation (HCC)   . CKD (chronic kidney disease), stage II   . CVA (cerebral infarction)   . Diabetes mellitus type II   . GERD (gastroesophageal reflux disease)   . Hiatal hernia   . HTN (hypertension)   . Hyperlipidemia   . IBS (irritable bowel syndrome)   . Stroke (HCC)   . Vitamin D deficiency     Past Surgical History:  Procedure Laterality Date  . CATARACT  EXTRACTION, BILATERAL    . CHOLECYSTECTOMY    . COLONOSCOPY  2005  . ESOPHAGOGASTRODUODENOSCOPY (EGD) WITH PROPOFOL N/A 11/28/2017   Procedure: ESOPHAGOGASTRODUODENOSCOPY (EGD) WITH PROPOFOL;  Surgeon: Graylin Shiver, MD;  Location: [facility] ENDOSCOPY;  Service: Endoscopy;  Laterality: N/A;  . TONSILLECTOMY    . UPPER GASTROINTESTINAL ENDOSCOPY  2005     reports that she has never smoked. She has never used smokeless tobacco. She reports that she does not drink alcohol or use drugs.  Allergies  Allergen Reactions  . Ace Inhibitors Other (See Comments)    Cough  . Fosamax [Alendronate Sodium] Other (See Comments)    Heart burn  . Norvasc [Amlodipine Besylate] Other (See Comments)    edema  . Zocor [Simvastatin] Other (See Comments)    Elevated CPK    Family History  Problem Relation Age of Onset  . Diabetes Mother   . Stroke Mother   . Hypertension Brother   . Heart disease Brother   . Arthritis Sister        Rhematoid  . Arthritis Sister        Rhematoid     Prior to Admission medications   Medication Sig Start Date End Date Taking? Authorizing Provider  Cholecalciferol (VITAMIN D) 2000 UNITS CAPS Take 2,000 Units by mouth daily.    Yes [provider]  CINNAMON PO Take 1 capsule by mouth daily.  Yes [provider]  dicyclomine (BENTYL) 20 MG tablet Take 1 tablet 3 x day if needed for nausea, bloating , cramping or diarrhea Patient taking differently: Take 20 mg by mouth 3 (three) times daily as needed for spasms.  06/24/15  Yes Lucky Cowboy, MD  furosemide (LASIX) 20 MG tablet Take 1 tablet (20 mg total) by mouth daily. 01/01/18  Yes Judd Gaudier, NP  losartan (COZAAR) 50 MG tablet Take 1/2-1 tab daily. Patient taking differently: Take 25 mg by mouth daily.  12/20/17  Yes Judd Gaudier, NP  Magnesium Hydroxide (MAGNESIA PO) Take 1 tablet by mouth daily.    Yes [provider]  metoprolol tartrate (LOPRESSOR) 25 MG tablet Take 0.5 tablets (12.5  mg total) by mouth 2 (two) times daily. 07/16/17 03/16/26 Yes Lucky Cowboy, MD  ondansetron (ZOFRAN ODT) 4 MG disintegrating tablet Take 1 tablet (4 mg total) by mouth every 8 (eight) hours as needed for nausea or vomiting. 11/29/17  Yes Rai, Ripudeep K, MD  pantoprazole (PROTONIX) 40 MG tablet Take 1 tablet daily for Heartburn & Acid Indigestion Patient taking differently: Take 40 mg by mouth daily.  02/27/18  Yes Lucky Cowboy, MD  potassium chloride (K-DUR) 10 MEQ tablet Take 1 tablet (10 mEq total) by mouth daily. Patient taking differently: Take 10 mEq by mouth every evening.  12/20/17  Yes Judd Gaudier, NP  QUEtiapine (SEROQUEL) 25 MG tablet Take 1 tablet at bedtime Patient taking differently: Take 25 mg by mouth at bedtime.  10/03/17 09/24/18 Yes Lucky Cowboy, MD  warfarin (COUMADIN) 2 MG tablet TAKE AS DIRECTED BY ANTICOAGULATION CLINIC. Patient taking differently: Take 1-2 mg by mouth See admin instructions. Take 1 mg tablet on Tuesday and Thursday then take 2 mg tablet all the other days. Take in the am 01/16/18  Yes Lars Masson, MD  sucralfate (CARAFATE) 1 g tablet Take 1 tablet (1 g total) by mouth 4 (four) times daily as needed for up to 15 days. Patient taking differently: Take 1 g by mouth as needed.  01/21/18 03/16/26  Lucky Cowboy, MD    Physical Exam: Vitals:   03/21/18 1115 03/21/18 1130 03/21/18 1348 03/21/18 1400  BP: (!) 173/84 (!) 162/82 (!) 163/93 (!) 176/84  Pulse: 80 90 71 66  Resp: 17 18 18 15   Temp:      TempSrc:      SpO2: 93% 98% 98% 97%    Constitutional: NAD, calm, comfortable Vitals:   03/21/18 1115 03/21/18 1130 03/21/18 1348 03/21/18 1400  BP: (!) 173/84 (!) 162/82 (!) 163/93 (!) 176/84  Pulse: 80 90 71 66  Resp: 17 18 18 15   Temp:      TempSrc:      SpO2: 93% 98% 98% 97%   Eyes: PERRL, lids and conjunctivae normal ENMT: Mucous membranes are moist. Posterior pharynx clear of any exudate or lesions.Normal dentition.  Neck: normal,  supple, no masses, no thyromegaly Respiratory: clear to auscultation bilaterally, no wheezing, no crackles. Normal respiratory effort. No accessory muscle use.  Cardiovascular: Regular rate and rhythm, no murmurs / rubs / gallops. No extremity edema. 2+ pedal pulses. No carotid bruits.  Abdomen: no tenderness, no masses palpated. No hepatosplenomegaly. Bowel sounds positive.  Musculoskeletal: no clubbing / cyanosis. No joint deformity upper and lower extremities. Good ROM, no contractures. Normal muscle tone.  Skin: no rashes, lesions, ulcers. No induration Neurologic: CN 2-12 grossly intact. Sensation intact, DTR normal. Strength 5/5 in all 4.  Psychiatric: Normal judgment and insight. Alert  and oriented x 3. Normal mood.    Labs on Admission: I have personally reviewed following labs and imaging studies  CBC: Recent Labs  Lab 03/15/18 2338 03/21/18 1041  WBC 8.0 7.7  NEUTROABS  --  5.4  HGB 15.2* 12.7  HCT 47.2* 40.4  MCV 90.9 92.7  PLT 250 227   Basic Metabolic Panel: Recent Labs  Lab 03/15/18 2338 03/21/18 1041  NA 140 139  K 3.7 4.3  CL 98 107  CO2 29 24  GLUCOSE 160* 125*  BUN 16 12  CREATININE 0.92 0.88  CALCIUM 10.1 8.8*   GFR: CrCl cannot be calculated (Unknown ideal weight.). Liver Function Tests: Recent Labs  Lab 03/15/18 2338 03/21/18 1041  AST 24 30  ALT 16 15  ALKPHOS 145* 101  BILITOT 0.7 1.5*  PROT 7.4 6.5  ALBUMIN 4.0 3.2*   Recent Labs  Lab 03/15/18 2338  LIPASE 44   No results for input(s): AMMONIA in the last 168 hours. Coagulation Profile: Recent Labs  Lab 03/21/18 1340  INR 1.73   Cardiac Enzymes: No results for input(s): CKTOTAL, CKMB, CKMBINDEX, TROPONINI in the last 168 hours. BNP (last 3 results) No results for input(s): PROBNP in the last 8760 hours. HbA1C: No results for input(s): HGBA1C in the last 72 hours. CBG: Recent Labs  Lab 03/21/18 1110  GLUCAP 114*   Lipid Profile: No results for input(s): CHOL, HDL,  LDLCALC, TRIG, CHOLHDL, LDLDIRECT in the last 72 hours. Thyroid Function Tests: No results for input(s): TSH, T4TOTAL, FREET4, T3FREE, THYROIDAB in the last 72 hours. Anemia Panel: No results for input(s): VITAMINB12, FOLATE, FERRITIN, TIBC, IRON, RETICCTPCT in the last 72 hours. Urine analysis:    Component Value Date/Time   COLORURINE YELLOW 03/21/2018 1227   APPEARANCEUR CLEAR 03/21/2018 1227   LABSPEC 1.009 03/21/2018 1227   PHURINE 7.0 03/21/2018 1227   GLUCOSEU NEGATIVE 03/21/2018 1227   HGBUR SMALL (A) 03/21/2018 1227   BILIRUBINUR NEGATIVE 03/21/2018 1227   KETONESUR NEGATIVE 03/21/2018 1227   PROTEINUR NEGATIVE 03/21/2018 1227   UROBILINOGEN 1.0 04/02/2015 1244   NITRITE NEGATIVE 03/21/2018 1227   LEUKOCYTESUR NEGATIVE 03/21/2018 1227    Radiological Exams on Admission: Dg Chest 2 View  Result Date: 03/21/2018 CLINICAL DATA:  Larey Seat syncopal episode EXAM: CHEST - 2 VIEW COMPARISON:  Portable chest x-ray of 12/02/2016 FINDINGS: The opacity at the left lung base appears to be due to a very large hiatal hernia with some volume loss at the left lung base. No pneumonia or effusion is seen. Cardiomegaly is stable. The bones are diffusely osteopenic. IMPRESSION: 1. Large hiatal hernia. 2. Stable cardiomegaly. Electronically Signed   By: Dwyane Dee M.D.   On: 03/21/2018 12:15   Ct Head Wo Contrast  Result Date: 03/21/2018 CLINICAL DATA:  Unwitnessed fall. EXAM: CT HEAD WITHOUT CONTRAST TECHNIQUE: Contiguous axial images were obtained from the base of the skull through the vertex without intravenous contrast. COMPARISON:  Head CT 04/14/2017 FINDINGS: Brain: Chronic findings of encephalomalacia in the periventricular LEFT frontal lobe and RIGHT cerebellum. Chronic atrophy and periventricular white matter disease. No acute intracranial hemorrhage. No focal mass lesion. No CT evidence of acute infarction. No midline shift or mass effect. No hydrocephalus. Basilar cisterns are patent.  Vascular: No hyperdense vessel or unexpected calcification. Skull: Normal. Negative for fracture or focal lesion. Sinuses/Orbits: Paranasal sinuses and mastoid air cells are clear. Orbits are clear. Other: None. IMPRESSION: 1. No acute intracranial findings.  No evidence trauma. 2. Chronic infarctions as above.  Electronically Signed   By: Genevive Bi M.D.   On: 03/21/2018 12:18    EKG: Independently reviewed. A fib   Assessment/Plan Active Problems:   Essential hypertension   Atrial fibrillation (HCC)   History of CVA (cerebrovascular accident)   Diastolic CHF (HCC)   CKD stage 2 due to type 2 diabetes mellitus (HCC)   Syncope  1-Syncope;  -Could be vaso-vagal. Also due to recent history of nausea, vomiting hypovolemia is in the differential.   -Monitor on telemetry.  -Cycle cardiac enzymes.  -Check MRI, INR not therapeutic and last stroke patient presented with syncope.  -IV fluids.  Check orthostatic.  Monitor neuro check.   2-A fib, permanent.  continue with coumadin. Pharmacy to dose.   3-Right lower extremity pain; check x ray and doppler to rule out blood clot.   4-Hiatal hernia; continue with PPI.   5-HTN;  Continue with cozaar and metoprolol. SBP 170 in the ED.  Hold lasix for now.   DVT prophylaxis: on coumadin.  Code Status: DNR Family Communication: sons at bedside.  Disposition Plan: discharge back to prior facility  Consults called: None Admission status: Observation, telemetry admission for syncope work up.    Alba Cory MD [facility] Pager [phone]  If 7PM-7AM, please contact night-coverage [email or web address] Password [email or web address]  03/21/2018, 3:17 PM

## 2018-03-21 NOTE — ED Notes (Signed)
Pt in MRI. Report given to [facility]

## 2018-03-22 ENCOUNTER — Other Ambulatory Visit: Payer: Self-pay

## 2018-03-22 ENCOUNTER — Observation Stay (HOSPITAL_COMMUNITY): Payer: Medicare Other

## 2018-03-22 ENCOUNTER — Observation Stay (HOSPITAL_BASED_OUTPATIENT_CLINIC_OR_DEPARTMENT_OTHER): Payer: Medicare Other

## 2018-03-22 ENCOUNTER — Encounter (HOSPITAL_COMMUNITY): Payer: Self-pay | Admitting: General Practice

## 2018-03-22 DIAGNOSIS — R0602 Shortness of breath: Secondary | ICD-10-CM | POA: Diagnosis not present

## 2018-03-22 DIAGNOSIS — R55 Syncope and collapse: Secondary | ICD-10-CM | POA: Diagnosis not present

## 2018-03-22 DIAGNOSIS — R52 Pain, unspecified: Secondary | ICD-10-CM

## 2018-03-22 LAB — MRSA PCR SCREENING: MRSA by PCR: NEGATIVE

## 2018-03-22 LAB — CBC
HEMATOCRIT: 40.6 % (ref 36.0–46.0)
Hemoglobin: 12.7 g/dL (ref 12.0–15.0)
MCH: 28.3 pg (ref 26.0–34.0)
MCHC: 31.3 g/dL (ref 30.0–36.0)
MCV: 90.6 fL (ref 80.0–100.0)
NRBC: 0 % (ref 0.0–0.2)
Platelets: 265 10*3/uL (ref 150–400)
RBC: 4.48 MIL/uL (ref 3.87–5.11)
RDW: 13.2 % (ref 11.5–15.5)
WBC: 6.8 10*3/uL (ref 4.0–10.5)

## 2018-03-22 LAB — COMPREHENSIVE METABOLIC PANEL
ALBUMIN: 3 g/dL — AB (ref 3.5–5.0)
ALT: 12 U/L (ref 0–44)
AST: 18 U/L (ref 15–41)
Alkaline Phosphatase: 97 U/L (ref 38–126)
Anion gap: 12 (ref 5–15)
BUN: 13 mg/dL (ref 8–23)
CHLORIDE: 105 mmol/L (ref 98–111)
CO2: 23 mmol/L (ref 22–32)
CREATININE: 0.86 mg/dL (ref 0.44–1.00)
Calcium: 9.4 mg/dL (ref 8.9–10.3)
GFR calc non Af Amer: 57 mL/min — ABNORMAL LOW (ref >60)
GLUCOSE: 107 mg/dL — AB (ref 70–99)
Potassium: 3.8 mmol/L (ref 3.5–5.1)
SODIUM: 140 mmol/L (ref 135–145)
Total Bilirubin: 0.8 mg/dL (ref 0.3–1.2)
Total Protein: 6.1 g/dL — ABNORMAL LOW (ref 6.5–8.1)

## 2018-03-22 LAB — URINE CULTURE

## 2018-03-22 LAB — PROTIME-INR
INR: 1.66
Prothrombin Time: 19.4 seconds — ABNORMAL HIGH (ref 11.4–15.2)

## 2018-03-22 LAB — HEPARIN LEVEL (UNFRACTIONATED): Heparin Unfractionated: 0.5 IU/mL (ref 0.30–0.70)

## 2018-03-22 LAB — TROPONIN I: Troponin I: <0.03 ng/mL (ref <0.03)

## 2018-03-22 MED ORDER — WARFARIN SODIUM 1 MG PO TABS: 1.0000 mg | ORAL_TABLET | Freq: Once | ORAL | Status: DC

## 2018-03-22 MED ORDER — IOPAMIDOL (ISOVUE-370) INJECTION 76%
100.0000 mL | Freq: Once | INTRAVENOUS | Status: AC | PRN
  Administered 2018-03-22: 100 mL via INTRAVENOUS

## 2018-03-22 MED ORDER — IOPAMIDOL (ISOVUE-370) INJECTION 76%
INTRAVENOUS | Status: AC
  Filled 2018-03-22: qty 100

## 2018-03-22 MED ORDER — WARFARIN SODIUM 2 MG PO TABS
2.0000 mg | ORAL_TABLET | Freq: Once | ORAL | Status: AC
  Administered 2018-03-22: 2 mg via ORAL
  Filled 2018-03-22: qty 1

## 2018-03-22 MED ORDER — HEPARIN (PORCINE) IN NACL 100-0.45 UNIT/ML-% IJ SOLN
900.0000 [IU]/h | INTRAMUSCULAR | Status: DC
  Administered 2018-03-22: 900 [IU]/h via INTRAVENOUS
  Filled 2018-03-22: qty 250

## 2018-03-22 MED ORDER — HEPARIN BOLUS VIA INFUSION
3000.0000 [IU] | Freq: Once | INTRAVENOUS | Status: AC
  Administered 2018-03-22: 3000 [IU] via INTRAVENOUS
  Filled 2018-03-22: qty 3000

## 2018-03-22 NOTE — Progress Notes (Addendum)
PROGRESS NOTE    Lisa STEVEN  Hopkins:[id number] DOB: 06-23-1924 DOA: 03/21/2018 PCP: Lucky Cowboy, MD   Brief Narrative:  HPI: Lisa Hopkins is a 84 y.o. female with medical history significant of large hiatal hernia, prior admission for nausea vomiting, exacerbation of her hiatal hernia defect, A fib on coumadin, prior CVA, HTN who presents after syncope episode. Patient relates she wake up this morning, had breakfast and sat in her recliner to sleep. She wake up, felt funny and went to bathroom. She sat down , had BM and subsequently pass out. She denies chest pain, dyspnea, or palpitation prior to episode. Of note patient was seen in the ED last week with nausea, vomiting and diarrhea. She received fluids, CT abdomen negative for acute finding. She was discharge. She has been able to eat. No further vomiting episodes. She h=did had watery stool episode after syncope,.   She is complaining of right calf pain. She denies chest pain. She denies dyspnea. She is a some what  sleepy, tired.  Per family, patient had syncope when she had stroke.   ED Course: sodium 139, k 4.3, bili 1.5, hb 12, wbc at 7.7, INR 1.7. CT head no acute bleed. UA 0-5, chest x ray stable cardiomegaly.   Assessment & Plan:   Active Problems:   Essential hypertension   Atrial fibrillation (HCC)   History of CVA (cerebrovascular accident)   Diastolic CHF (HCC)   CKD stage 2 due to type 2 diabetes mellitus (HCC)   Syncope   1-Syncope;  Troponin negative, orthostatic negative.  Wonder if secondary to PE. Patient with subtherapeutic INR and positive DVT Will check CT angio rule out PE  Will continue with coumadin for now.   2-New DVT Right Lower extremity;  Was on coumadin prior to admission, sub-therapeutic.   3-A fib; was on coumadin prior to admission.   Hiatal hernia; continue with PPI.   HTN;  Continue with metoprolol.  Holding cozaar.    DVT prophylaxis: Heparin  Code Status:  DNR Family Communication: Son at bedside. Disposition Plan: remain inpatient for IV heparin, new DVT and will rule out PE.    Consultants:   none   Procedures:  Doppler; Positive for acute DVT involving peroneal vein a short segment at mid to distal calf.    Antimicrobials:   none   Subjective: She report SOB on exertion today, walking on the hall.  Denies chest pain, but has had chest pain in the past.    Objective: Vitals:   03/22/18 0402 03/22/18 0405 03/22/18 0943 03/22/18 1226  BP:  132/62  127/72  Pulse:  60 72 72  Resp:  18  18  Temp:  97.7 F (36.5 C)  97.6 F (36.4 C)  TempSrc:  Oral  Oral  SpO2:  96%  95%  Weight: 55.3 kg     Height:        Intake/Output Summary (Last 24 hours) at 03/22/2018 1406 Last data filed at 03/22/2018 0958 Gross per 24 hour  Intake 834.71 ml  Output 850 ml  Net -15.29 ml   Filed Weights   03/22/18 0402  Weight: 55.3 kg    Examination:  General exam: Appears calm and comfortable  Respiratory system: CTA Cardiovascular system: S1 & S2 heard, RRR. No JVD, murmurs, rubs, gallops or clicks. No pedal edema. Gastrointestinal system: Abdomen is nondistended, soft and nontender. No organomegaly or masses felt. Normal bowel sounds heard. Central nervous system: Alert and oriented. No focal neurological  deficits. Extremities: Symmetric 5 x 5 power. Edema right lower extremity  Skin: No rashes, lesions or ulcers  Data Reviewed: I have personally reviewed following labs and imaging studies  CBC: Recent Labs  Lab 03/15/18 2338 03/21/18 1041 03/22/18 0256  WBC 8.0 7.7 6.8  NEUTROABS  --  5.4  --   HGB 15.2* 12.7 12.7  HCT 47.2* 40.4 40.6  MCV 90.9 92.7 90.6  PLT 250 227 265   Basic Metabolic Panel: Recent Labs  Lab 03/15/18 2338 03/21/18 1041 03/22/18 0256  NA 140 139 140  K 3.7 4.3 3.8  CL 98 107 105  CO2 29 24 23   GLUCOSE 160* 125* 107*  BUN 16 12 13   CREATININE 0.92 0.88 0.86  CALCIUM 10.1 8.8* 9.4    GFR: Estimated Creatinine Clearance: 35.2 mL/min (by C-G formula based on SCr of 0.86 mg/dL). Liver Function Tests: Recent Labs  Lab 03/15/18 2338 03/21/18 1041 03/22/18 0256  AST 24 30 18   ALT 16 15 12   ALKPHOS 145* 101 97  BILITOT 0.7 1.5* 0.8  PROT 7.4 6.5 6.1*  ALBUMIN 4.0 3.2* 3.0*   Recent Labs  Lab 03/15/18 2338  LIPASE 44   No results for input(s): AMMONIA in the last 168 hours. Coagulation Profile: Recent Labs  Lab 03/21/18 1340 03/22/18 0742  INR 1.73 1.66   Cardiac Enzymes: Recent Labs  Lab 03/21/18 1629 03/21/18 2112 03/22/18 0256  TROPONINI <0.03 <0.03 <0.03   BNP (last 3 results) No results for input(s): PROBNP in the last 8760 hours. HbA1C: No results for input(s): HGBA1C in the last 72 hours. CBG: Recent Labs  Lab 03/21/18 1110  GLUCAP 114*   Lipid Profile: No results for input(s): CHOL, HDL, LDLCALC, TRIG, CHOLHDL, LDLDIRECT in the last 72 hours. Thyroid Function Tests: No results for input(s): TSH, T4TOTAL, FREET4, T3FREE, THYROIDAB in the last 72 hours. Anemia Panel: No results for input(s): VITAMINB12, FOLATE, FERRITIN, TIBC, IRON, RETICCTPCT in the last 72 hours. Sepsis Labs: No results for input(s): PROCALCITON, LATICACIDVEN in the last 168 hours.  Recent Results (from the past 240 hour(s))  Urine culture     Status: Abnormal   Collection Time: 03/21/18 12:27 PM  Result Value Ref Range Status   Specimen Description URINE, RANDOM  Final   Special Requests   Final    NONE Performed at [facility], [address] [place], [place] [address]    Culture MULTIPLE SPECIES PRESENT, SUGGEST RECOLLECTION (A)  Final   Report Status 03/22/2018 FINAL  Final  MRSA PCR Screening     Status: None   Collection Time: 03/21/18  8:29 PM  Result Value Ref Range Status   MRSA by PCR NEGATIVE NEGATIVE Final    Comment:        The GeneXpert MRSA Assay (FDA approved for NASAL specimens only), is one component of a comprehensive MRSA  colonization surveillance program. It is not intended to diagnose MRSA infection nor to guide or monitor treatment for MRSA infections. Performed at [facility], [address], [place], [place] [address]          Radiology Studies: Dg Chest 2 View  Result Date: 03/21/2018 CLINICAL DATA:  Larey Seat syncopal episode EXAM: CHEST - 2 VIEW COMPARISON:  Portable chest x-ray of 12/02/2016 FINDINGS: The opacity at the left lung base appears to be due to a very large hiatal hernia with some volume loss at the left lung base. No pneumonia or effusion is seen. Cardiomegaly is stable.  The bones are diffusely osteopenic. IMPRESSION: 1. Large hiatal hernia. 2. Stable cardiomegaly. Electronically Signed   By: Dwyane Dee M.D.   On: 03/21/2018 12:15   Dg Tibia/fibula Right  Result Date: 03/21/2018 CLINICAL DATA:  Right ankle pain after fall. EXAM: RIGHT TIBIA AND FIBULA - 2 VIEW COMPARISON:  None. FINDINGS: There is no evidence of fracture or other focal bone lesions. Soft tissues are unremarkable. IMPRESSION: Negative. Electronically Signed   By: Lupita Raider, M.D.   On: 03/21/2018 15:27   Ct Head Wo Contrast  Result Date: 03/21/2018 CLINICAL DATA:  Unwitnessed fall. EXAM: CT HEAD WITHOUT CONTRAST TECHNIQUE: Contiguous axial images were obtained from the base of the skull through the vertex without intravenous contrast. COMPARISON:  Head CT 04/14/2017 FINDINGS: Brain: Chronic findings of encephalomalacia in the periventricular LEFT frontal lobe and RIGHT cerebellum. Chronic atrophy and periventricular white matter disease. No acute intracranial hemorrhage. No focal mass lesion. No CT evidence of acute infarction. No midline shift or mass effect. No hydrocephalus. Basilar cisterns are patent. Vascular: No hyperdense vessel or unexpected calcification. Skull: Normal. Negative for fracture or focal lesion. Sinuses/Orbits: Paranasal sinuses and mastoid air cells are clear. Orbits are clear. Other:  None. IMPRESSION: 1. No acute intracranial findings.  No evidence trauma. 2. Chronic infarctions as above. Electronically Signed   By: Genevive Bi M.D.   On: 03/21/2018 12:18   Mr Brain Wo Contrast  Result Date: 03/21/2018 CLINICAL DATA:  84 y/o  F; syncope and unwitnessed fall. EXAM: MRI HEAD WITHOUT CONTRAST TECHNIQUE: Multiplanar, multiecho pulse sequences of the brain and surrounding structures were obtained without intravenous contrast. COMPARISON:  03/21/2018 CT head. FINDINGS: Brain: No acute infarction, hemorrhage, hydrocephalus, extra-axial collection or mass lesion. Right inferior cerebellar chronic infarction. Chronic left anterolateral frontal, left anterior basal ganglia, and anterior insula infarction. Small chronic lacunar infarct within the left hemi pons. Patchy nonspecific T2 FLAIR hyperintensities in subcortical and periventricular white matter as well as the pons are compatible with moderate chronic microvascular ischemic changes for age. Moderate volume loss of the brain. Punctate focus of susceptibility hypointensity within the right inferior cerebellum is compatible with hemosiderin deposition of chronic microhemorrhage. Vascular: Normal flow voids. Skull and upper cervical spine: Normal marrow signal. Sinuses/Orbits: Left frontal sinus mucosal thickening. No additional abnormal signal of the paranasal sinuses or mastoid air cells. Bilateral intra-ocular lens replacement. Other: None. IMPRESSION: 1. No acute intracranial abnormality identified. 2. Stable chronic infarcts, volume loss, and microvascular ischemic changes of the brain as above. Electronically Signed   By: Mitzi Hansen M.D.   On: 03/21/2018 16:31        Scheduled Meds: . cholecalciferol  2,000 Units Oral Daily  . iopamidol      . metoprolol tartrate  12.5 mg Oral BID  . pantoprazole  40 mg Oral Daily  . QUEtiapine  12.5 mg Oral QHS  . warfarin  2 mg Oral ONCE-1800  . Warfarin - Pharmacist Dosing  Inpatient   Does not apply q1800   Continuous Infusions: . sodium chloride 50 mL/hr at 03/21/18 1710  . heparin 900 Units/hr (03/22/18 1256)     LOS: 0 days    Time spent: 35 minutes,     Alba Cory, MD [facility] Pager [phone]  If 7PM-7AM, please contact night-coverage [email or web address] Password [email or web address] 03/22/2018, 2:06 PM

## 2018-03-22 NOTE — Evaluation (Signed)
Occupational Therapy Evaluation Patient Details Name: Lisa Hopkins MRN: [id number] DOB: 09-18-1924 Today's Date: 03/22/2018    History of Present Illness Pt is a 84 y.o. female admitted 03/21/18 after passing out on toilet while having BM. Worked up for syncope, potentially vaso-vagal. Abdominal CT negative for acute finding. Head CT and MRI no acute intracranial abnormality. CXR stable cardiomegaly. C/o LE pain; awaiting ultrasound to rule out DVT. PMH includes CVA, HTN, a-fib, hiatal hernia defect.   Clinical Impression   Pt is typically assisted 5 days a week by an aide for ADL, but showers and dresses herself on the weekends. She walks throughout her ILF with RW independently. Pt has baseline memory deficits per son who is bedside and supportive. Pt is currently functioning at a supervision to min guard assist level. Will follow acutely. Do not anticipate pt will need post acute OT.    Follow Up Recommendations  No OT follow up    Equipment Recommendations  None recommended by OT    Recommendations for Other Services       Precautions / Restrictions Precautions Precautions: Fall Restrictions Weight Bearing Restrictions: No      Mobility Bed Mobility Overal bed mobility: Modified Independent             General bed mobility comments: Increased time and effort; HOB slightly elevated  Transfers Overall transfer level: Needs assistance Equipment used: Rolling walker (2 wheeled) Transfers: Sit to/from Stand Sit to Stand: Supervision         General transfer comment: from bed and toilet    Balance Overall balance assessment: Needs assistance   Sitting balance-Leahy Scale: Good       Standing balance-Leahy Scale: Fair Standing balance comment: Can static stand with no UE support                           ADL either performed or assessed with clinical judgement   ADL Overall ADL's : Needs assistance/impaired Eating/Feeding: Independent;Bed  level   Grooming: Oral care;Standing;Supervision/safety   Upper Body Bathing: Set up;Sitting   Lower Body Bathing: Supervison/ safety;Sit to/from stand   Upper Body Dressing : Set up;Sitting   Lower Body Dressing: Supervision/safety;Sit to/from stand   Toilet Transfer: RW;Ambulation;Min guard   Toileting- Architect and Hygiene: Supervision/safety;Sit to/from stand       Functional mobility during ADLs: Min guard;Rolling walker       Vision Patient Visual Report: No change from baseline       Perception     Praxis      Pertinent Vitals/Pain Pain Assessment: No/denies pain     Hand Dominance Right   Extremity/Trunk Assessment Upper Extremity Assessment Upper Extremity Assessment: Overall WFL for tasks assessed   Lower Extremity Assessment Lower Extremity Assessment: Defer to PT evaluation   Cervical / Trunk Assessment Cervical / Trunk Assessment: Kyphotic   Communication Communication Communication: HOH   Cognition Arousal/Alertness: Awake/alert Behavior During Therapy: WFL for tasks assessed/performed Overall Cognitive Status: Impaired/Different from baseline Area of Impairment: Memory;Problem solving                     Memory: Decreased short-term memory       Problem Solving: Slow processing;Requires verbal cues General Comments: Likely baseline cognition   General Comments       Exercises     Shoulder Instructions      Home Living Family/patient expects to be discharged to:: Private residence Living Arrangements:  Alone Available Help at Discharge: Family;Personal care attendant;Available PRN/intermittently Type of Home: Independent living facility Home Access: Level entry     Home Layout: One level     Bathroom Shower/Tub: Producer, television/film/video: Standard     Home Equipment: Environmental consultant - 4 wheels;Cane - single point;Grab bars - toilet;Grab bars - tub/shower   Additional Comments: from [facility]       Prior Functioning/Environment Level of Independence: Needs assistance  Gait / Transfers Assistance Needed: Ambulatory with rollator ADL's / Homemaking Assistance Needed: Assist from aide 5x/wk for bathing/dressing and household tasks. Pt performs these herself over weekend. Goes to dining room for meals, assisted for housekeeping.            OT Problem List: Impaired balance (sitting and/or standing);Decreased cognition      OT Treatment/Interventions: Self-care/ADL training;DME and/or AE instruction;Balance training;Patient/family education;Therapeutic activities    OT Goals(Current goals can be found in the care plan section) Acute Rehab OT Goals Patient Stated Goal: Return home, get energy back OT Goal Formulation: With patient Time For Goal Achievement: 04/05/18 Potential to Achieve Goals: Good  OT Frequency: Min 2X/week   Barriers to D/C:            Co-evaluation              AM-PAC PT "6 Clicks" Daily Activity     Outcome Measure Help from another person eating meals?: None Help from another person taking care of personal grooming?: A Little Help from another person toileting, which includes using toliet, bedpan, or urinal?: A Little Help from another person bathing (including washing, rinsing, drying)?: A Little Help from another person to put on and taking off regular upper body clothing?: None Help from another person to put on and taking off regular lower body clothing?: A Little 6 Click Score: 20   End of Session Equipment Utilized During Treatment: Gait belt;Rolling walker  Activity Tolerance: Patient tolerated treatment well Patient left: in bed;with call bell/phone within reach;with family/visitor present  OT Visit Diagnosis: Other abnormalities of gait and mobility (R26.89);Other symptoms and signs involving cognitive function                Time: 2956-2130 OT Time Calculation (min): 22 min Charges:  OT General Charges $OT Visit: 1 Visit OT  Evaluation $OT Eval Moderate Complexity: 1 Mod  Martie Round, OTR/L Acute Rehabilitation Services Pager: [phone] Office: [phone]  Evern Bio 03/22/2018, 1:43 PM

## 2018-03-22 NOTE — Care Management Note (Signed)
Case Management Note  Patient Details  Name: Lisa Hopkins MRN: [id number] Date of Birth: 1924-07-12  Subjective/Objective:        Syncope  Action/Plan: Patient resides at [facility]; PCP: Lucky Cowboy, MD; have private insurance with BCBS with prescription drug coverage; CM following for progression of care.  Expected Discharge Date:   possibly 03/23/2018               Expected Discharge Plan:  Home/Self Care  Discharge planning Services  CM Consult  Status of Service:  In process, will continue to follow  Reola Mosher [phone] 03/22/2018, 1:23 PM

## 2018-03-22 NOTE — Progress Notes (Addendum)
ANTICOAGULATION CONSULT NOTE  Pharmacy Consult for heparin Indication: atrial fibrillation  Allergies  Allergen Reactions  . Ace Inhibitors Other (See Comments)    Cough  . Fosamax [Alendronate Sodium] Other (See Comments)    Heart burn  . Norvasc [Amlodipine Besylate] Other (See Comments)    edema  . Zocor [Simvastatin] Other (See Comments)    Elevated CPK    Patient Measurements: Height: 5\' 3"  (160 cm) Weight: 122 lb (55.3 kg) IBW/kg (Calculated) : 52.4 Heparin Dosing Weight:   Vital Signs: Temp: 97.7 F (36.5 C) (10/25 0405) Temp Source: Oral (10/25 0405) BP: 132/62 (10/25 0405) Pulse Rate: 60 (10/25 0405)  Labs: Recent Labs    03/21/18 1041 03/21/18 1340 03/21/18 1629 03/21/18 2112 03/22/18 0256 03/22/18 0742  HGB 12.7  --   --   --  12.7  --   HCT 40.4  --   --   --  40.6  --   PLT 227  --   --   --  265  --   LABPROT  --  20.0*  --   --   --  19.4*  INR  --  1.73  --   --   --  1.66  CREATININE 0.88  --   --   --  0.86  --   TROPONINI  --   --  <0.03 <0.03 <0.03  --     Estimated Creatinine Clearance: 35.2 mL/min (by C-G formula based on SCr of 0.86 mg/dL).  Assessment: 66 yof presented to the ED after a fall. She is on chronic warfarin for history of afib. INR is subtherapeutic. No bleeding noted.   Anticoag: Warfarin for hx afib - INR 1.73, CBC WNL, no bleeding noted - CT head neg for bleed (PTA 1mg  TT, 2mg  all other days>>takes dose in AM, LD 10/24 - dose per [facility] note 1mg  on Tues/Sat, 2mg  all other days)  INR 1.66 today  10/25 new dvt in LE  Renal: SCr 0.86  Heme/Onc: H&H 12.7/40.6, Plt 265  Goal of Therapy:  INR 2-3 Monitor platelets by anticoagulation protocol: Yes   Plan:  Heparin bolus 3000 units x 1 Heparin gtt 900 units/hr Warfarin 2 mg x 1 - may need to consider change of agent vs sub therapeutic INR (was 2.2 on 10/2) Initial lvl 2200 Daily INR CBC HL  Isaac Bliss, PharmD, BCPS, BCCCP Clinical Pharmacist [phone]  Please  check AMION for all [facility] Pharmacy numbers  03/22/2018 8:36 AM

## 2018-03-22 NOTE — Evaluation (Addendum)
Physical Therapy Evaluation Patient Details Name: Lisa Hopkins MRN: [id number] DOB: August 28, 1924 Today's Date: 03/22/2018   History of Present Illness  Pt is a 84 y.o. female admitted 03/21/18 after passing out on toilet while having BM. Worked up for syncope, potentially vaso-vagal. Abdominal CT negative for acute finding. Head CT and MRI no acute intracranial abnormality. CXR stable cardiomegaly. C/o LE pain; awaiting ultrasound to rule out DVT. PMH includes CVA, HTN, a-fib, hiatal hernia defect.    Clinical Impression  Pt presents with an overall decrease in functional mobility secondary to above. PTA, pt resides in apartment at [facility]; ambulatory with rollator, aide assists 5x/wk with some ADLs and household tasks. Today, pt ambulatory with RW at supervision-level. Orthostatic BP negative (see below); pt asymptomatic. Limited by decreased activity tolerance and fatigue. Pt would benefit from continued acute PT services to maximize functional mobility and independence prior to d/c with follow-up PT services at ALF.   Orthostatic BPs  Supine 132/81  Sitting 138/119  Standing 137/103  Standing after 2 min 162/79      Follow Up Recommendations Supervision - Intermittent(PT at [facility])    Equipment Recommendations  None recommended by PT    Recommendations for Other Services       Precautions / Restrictions Precautions Precautions: Fall Restrictions Weight Bearing Restrictions: No      Mobility  Bed Mobility Overal bed mobility: Modified Independent             General bed mobility comments: Increased time and effort; HOB slightly elevated  Transfers Overall transfer level: Needs assistance Equipment used: Rolling walker (2 wheeled) Transfers: Sit to/from Stand Sit to Stand: Supervision         General transfer comment: Stood from bed and toilet with supervision; heavy reliance on UE support standing from lower toilet  height  Ambulation/Gait Ambulation/Gait assistance: Supervision Gait Distance (Feet): 100 Feet Assistive device: Rolling walker (2 wheeled) Gait Pattern/deviations: Step-through pattern;Decreased stride length;Trunk flexed Gait velocity: Decreased Gait velocity interpretation: 1.31 - 2.62 ft/sec, indicative of limited community ambulator General Gait Details: Slow, steady amb throughout room and in hallway with RW and supervision for safety  Stairs            Wheelchair Mobility    Modified Rankin (Stroke Patients Only)       Balance Overall balance assessment: Needs assistance   Sitting balance-Leahy Scale: Good       Standing balance-Leahy Scale: Fair Standing balance comment: Can static stand with no UE support                             Pertinent Vitals/Pain Pain Assessment: No/denies pain    Home Living Family/patient expects to be discharged to:: Assisted living               Home Equipment: Walker - 4 wheels;Cane - single point;Grab bars - toilet;Grab bars - tub/shower Additional Comments: From [facility]. Elevator to 3rd floor apartment    Prior Function Level of Independence: Needs assistance   Gait / Transfers Assistance Needed: Ambulatory with rollator  ADL's / Homemaking Assistance Needed: Assist from aide 5x/wk for bathing/dressing and household tasks. Pt performs these herself over weekend        Hand Dominance        Extremity/Trunk Assessment   Upper Extremity Assessment Upper Extremity Assessment: Overall WFL for tasks assessed    Lower Extremity Assessment Lower Extremity Assessment: Generalized weakness  Cervical / Trunk Assessment Cervical / Trunk Assessment: Kyphotic  Communication   Communication: HOH  Cognition Arousal/Alertness: Awake/alert Behavior During Therapy: WFL for tasks assessed/performed Overall Cognitive Status: Impaired/Different from baseline Area of Impairment: Problem solving                              Problem Solving: Slow processing;Requires verbal cues General Comments: Likely baseline cognition      General Comments      Exercises     Assessment/Plan    PT Assessment Patient needs continued PT services  PT Problem List Decreased strength;Decreased activity tolerance;Decreased balance;Decreased mobility;Cardiopulmonary status limiting activity       PT Treatment Interventions DME instruction;Gait training;Stair training;Functional mobility training;Therapeutic activities;Therapeutic exercise;Balance training;Patient/family education    PT Goals (Current goals can be found in the Care Plan section)  Acute Rehab PT Goals Patient Stated Goal: Return home, get energy back PT Goal Formulation: With patient Time For Goal Achievement: 04/05/18 Potential to Achieve Goals: Good    Frequency Min 3X/week   Barriers to discharge        Co-evaluation               AM-PAC PT "6 Clicks" Daily Activity  Outcome Measure Difficulty turning over in bed (including adjusting bedclothes, sheets and blankets)?: A Little Difficulty moving from lying on back to sitting on the side of the bed? : A Little Difficulty sitting down on and standing up from a chair with arms (e.g., wheelchair, bedside commode, etc,.)?: A Little Help needed moving to and from a bed to chair (including a wheelchair)?: A Little Help needed walking in hospital room?: A Little Help needed climbing 3-5 steps with a railing? : A Little 6 Click Score: 18    End of Session Equipment Utilized During Treatment: Gait belt Activity Tolerance: Patient tolerated treatment well;Patient limited by fatigue Patient left: in chair;with call bell/phone within reach;with chair alarm set;Other (comment)(with MD present) Nurse Communication: Mobility status PT Visit Diagnosis: Other abnormalities of gait and mobility (R26.89);Muscle weakness (generalized) (M62.81)    Time:  8295-6213 PT Time Calculation (min) (ACUTE ONLY): 24 min   Charges:   PT Evaluation $PT Eval Moderate Complexity: 1 Mod PT Treatments $Gait Training: 8-22 mins       Ina Homes, PT, DPT Acute Rehabilitation Services  Pager [phone] Office [phone]  Malachy Chamber 03/22/2018, 8:56 AM

## 2018-03-22 NOTE — Progress Notes (Signed)
Preliminary notes--Right lower extremity venous duplex exam completed. Positive for acute DVT involving peroneal vein a short segment at mid to distal calf.  Hongying Romesha Scherer (RDMS RVT) 03/22/18 11:21 AM

## 2018-03-23 DIAGNOSIS — R55 Syncope and collapse: Secondary | ICD-10-CM | POA: Diagnosis not present

## 2018-03-23 LAB — HEPARIN LEVEL (UNFRACTIONATED): HEPARIN UNFRACTIONATED: 0.62 [IU]/mL (ref 0.30–0.70)

## 2018-03-23 LAB — CBC
HEMATOCRIT: 41.2 % (ref 36.0–46.0)
HEMOGLOBIN: 12.9 g/dL (ref 12.0–15.0)
MCH: 28.3 pg (ref 26.0–34.0)
MCHC: 31.3 g/dL (ref 30.0–36.0)
MCV: 90.4 fL (ref 80.0–100.0)
Platelets: 248 10*3/uL (ref 150–400)
RBC: 4.56 MIL/uL (ref 3.87–5.11)
RDW: 13.6 % (ref 11.5–15.5)
WBC: 6.4 10*3/uL (ref 4.0–10.5)
nRBC: 0 % (ref 0.0–0.2)

## 2018-03-23 LAB — BASIC METABOLIC PANEL
ANION GAP: 7 (ref 5–15)
BUN: 12 mg/dL (ref 8–23)
CALCIUM: 9.1 mg/dL (ref 8.9–10.3)
CO2: 23 mmol/L (ref 22–32)
Chloride: 108 mmol/L (ref 98–111)
Creatinine, Ser: 0.8 mg/dL (ref 0.44–1.00)
GFR calc Af Amer: >60 mL/min (ref >60)
GLUCOSE: 95 mg/dL (ref 70–99)
Potassium: 3.7 mmol/L (ref 3.5–5.1)
Sodium: 138 mmol/L (ref 135–145)

## 2018-03-23 LAB — PROTIME-INR
INR: 2.03
Prothrombin Time: 22.6 seconds — ABNORMAL HIGH (ref 11.4–15.2)

## 2018-03-23 MED ORDER — ENOXAPARIN SODIUM 80 MG/0.8ML ~~LOC~~ SOLN
1.5000 mg/kg | SUBCUTANEOUS | Status: AC
  Administered 2018-03-23: 75 mg via SUBCUTANEOUS
  Filled 2018-03-23: qty 0.8

## 2018-03-23 MED ORDER — SODIUM CHLORIDE 0.9 % IV SOLN
INTRAVENOUS | Status: DC
  Administered 2018-03-23: 10:00:00 via INTRAVENOUS

## 2018-03-23 MED ORDER — WARFARIN SODIUM 1 MG PO TABS
1.0000 mg | ORAL_TABLET | Freq: Once | ORAL | Status: DC
  Filled 2018-03-23: qty 1

## 2018-03-23 MED ORDER — QUETIAPINE FUMARATE 25 MG PO TABS: 12.5000 mg | ORAL_TABLET | Freq: Every day | ORAL | 0 refills | Status: DC

## 2018-03-23 MED ORDER — LOSARTAN POTASSIUM 50 MG PO TABS: 25.0000 mg | ORAL_TABLET | Freq: Every day | ORAL | 0 refills | Status: DC

## 2018-03-23 NOTE — Progress Notes (Signed)
On call provider notified pt's HR decreased down to 32 bpm. This was not sustained and returned to 50-60's bpm when pt was assessed, asymptomatic. Pt's night dose of metoprolol was 12.5 mg and HR was in 60's.

## 2018-03-23 NOTE — Progress Notes (Signed)
On call provider notified pt feels urge to void but cannot - bladder scan volume 442 mL. Awaiting orders.

## 2018-03-23 NOTE — Progress Notes (Signed)
ANTICOAGULATION CONSULT NOTE  Pharmacy Consult for Heparin Indication: atrial fibrillation  Allergies  Allergen Reactions  . Ace Inhibitors Other (See Comments)    Cough  . Fosamax [Alendronate Sodium] Other (See Comments)    Heart burn  . Norvasc [Amlodipine Besylate] Other (See Comments)    edema  . Zocor [Simvastatin] Other (See Comments)    Elevated CPK    Patient Measurements: Height: 5\' 3"  (160 cm) Weight: 122 lb (55.3 kg) IBW/kg (Calculated) : 52.4 Heparin Dosing Weight:   Vital Signs: Temp: 97.7 F (36.5 C) (10/25 2001) Temp Source: Oral (10/25 2001) BP: 153/73 (10/25 2001) Pulse Rate: 65 (10/25 2205)  Labs: Recent Labs    03/21/18 1041 03/21/18 1340 03/21/18 1629 03/21/18 2112 03/22/18 0256 03/22/18 0742 03/22/18 2220  HGB 12.7  --   --   --  12.7  --   --   HCT 40.4  --   --   --  40.6  --   --   PLT 227  --   --   --  265  --   --   LABPROT  --  20.0*  --   --   --  19.4*  --   INR  --  1.73  --   --   --  1.66  --   HEPARINUNFRC  --   --   --   --   --   --  0.50  CREATININE 0.88  --   --   --  0.86  --   --   TROPONINI  --   --  <0.03 <0.03 <0.03  --   --     Estimated Creatinine Clearance: 35.2 mL/min (by C-G formula based on SCr of 0.86 mg/dL).  Assessment: 71 yof presented to the ED after a fall. She is on chronic warfarin for history of afib. INR is subtherapeutic. No bleeding noted.   Anticoag: Warfarin for hx afib - INR 1.73, CBC WNL, no bleeding noted - CT head neg for bleed (PTA 1mg  TT, 2mg  all other days>>takes dose in AM, LD 10/24 - dose per [facility] note 1mg  on Tues/Sat, 2mg  all other days)  INR 1.66 today  10/25 new dvt in LE  Renal: SCr 0.86  Heme/Onc: H&H 12.7/40.6, Plt 265   10/26 AM update: initial heparin level is therapeutic   Goal of Therapy:  Heparin level 0.3-0.7 units/mL INR 2-3 Monitor platelets by anticoagulation protocol: Yes   Plan:  Cont heparin gtt at 900 units/hr Confirmatory heparin level with AM  labs  Abran Duke, PharmD, BCPS Clinical Pharmacist Phone: [phone]

## 2018-03-23 NOTE — Progress Notes (Signed)
ANTICOAGULATION CONSULT NOTE  Pharmacy Consult for Heparin/warfarin Indication: atrial fibrillation  Allergies  Allergen Reactions  . Ace Inhibitors Other (See Comments)    Cough  . Fosamax [Alendronate Sodium] Other (See Comments)    Heart burn  . Norvasc [Amlodipine Besylate] Other (See Comments)    edema  . Zocor [Simvastatin] Other (See Comments)    Elevated CPK    Patient Measurements: Height: 5\' 3"  (160 cm) Weight: 112 lb (50.8 kg) IBW/kg (Calculated) : 52.4 Heparin Dosing Weight:   Vital Signs: Temp: 97.4 F (36.3 C) (10/26 0518) Temp Source: Oral (10/26 0518) BP: 152/88 (10/26 0518) Pulse Rate: 76 (10/26 0518)  Labs: Recent Labs    03/21/18 1041 03/21/18 1340 03/21/18 1629 03/21/18 2112 03/22/18 0256 03/22/18 0742 03/22/18 2220 03/23/18 0640  HGB 12.7  --   --   --  12.7  --   --  12.9  HCT 40.4  --   --   --  40.6  --   --  41.2  PLT 227  --   --   --  265  --   --  248  LABPROT  --  20.0*  --   --   --  19.4*  --  22.6*  INR  --  1.73  --   --   --  1.66  --  2.03  HEPARINUNFRC  --   --   --   --   --   --  0.50 0.62  CREATININE 0.88  --   --   --  0.86  --   --   --   TROPONINI  --   --  <0.03 <0.03 <0.03  --   --   --     Estimated Creatinine Clearance: 34.2 mL/min (by C-G formula based on SCr of 0.86 mg/dL).  Assessment: 9 yof presented to the ED after a fall. She is on chronic warfarin for history of afib.   Anticoag: Warfarin for hx afib - INR 1.73, CBC WNL, no bleeding noted - CT head neg for bleed (PTA 1mg  TT, 2mg  all other days>>takes dose in AM, LD 10/24 - dose per [facility] note 1mg  on Tues/Sat, 2mg  all other days)  10/25 new dvt in LE  Renal: SCr 0.86  INR now therapeutic at 2.03, heparin level therapeutic at 0.62, CBC wnl and stable, no bleeding reported.  Goal of Therapy:  Heparin level 0.3-0.7 units/mL INR 2-3 Monitor platelets by anticoagulation protocol: Yes   Plan:  Give warfarin 1mg  x 1 D/C heparin and give lovenox 75mg  x1  (1.5mg /kg) one hour after gtt d/c Daily INR, CBC, s/s bleeding  Daylene Posey, PharmD Clinical Pharmacist Please check AMION for all [facility] Pharmacy numbers 03/23/2018 8:45 AM

## 2018-03-23 NOTE — Progress Notes (Signed)
Pt void little bit with bowel movement this morning and post void bladder scan was 60cc, pt is not having any urge to void either, will continue to monitor  Lonia Farber, RN

## 2018-03-23 NOTE — Discharge Summary (Signed)
Physician Discharge Summary  Lisa Hopkins XBJ:[id number] DOB: 06/01/1924 DOA: 03/21/2018  PCP: Lucky Cowboy, MD  Admit date: 03/21/2018 Discharge date: 03/23/2018  Admitted From: Home  Disposition:  Home   Recommendations for Outpatient Follow-up:  1. Follow up with PCP in 1-2 weeks 2. Please obtain BMP/CBC in one week 3. Adjust coumadin as needed,. New diagnosis of DVT  Home Health: yes. Needs INR check on Monday   Discharge Condition: stable.  CODE STATUS: DNR Diet recommendation: Heart Healthy  Brief/Interim Summary: Brief Narrative:  HYQ:MVHQION H Foglemanis a 84 y.o.femalewith medical history significant oflarge hiatal hernia, prior admission for nausea vomiting, exacerbation of her hiatal hernia defect, A fib on coumadin, prior CVA, HTN who presents after syncope episode. Patient relates she wake up this morning, had breakfast and sat in her recliner to sleep. She wake up, felt funny and went to bathroom. She sat down , had BM and subsequently pass out. She denies chest pain, dyspnea, or palpitation prior to episode. Of note patient was seen in the ED last week with nausea, vomiting and diarrhea. She received fluids, CT abdomen negative for acute finding. She was discharge. She has been able to eat. No further vomiting episodes. She h=did had watery stool episode after syncope,.   She is complaining of right calf pain. She denies chest pain. She denies dyspnea. She is a some what sleepy, tired.  Per family, patient had syncope when she had stroke.  ED Course:sodium 139, k 4.3, bili 1.5, hb 12, wbc at 7.7, INR 1.7. CT head no acute bleed. UA 0-5, chest x ray stable cardiomegaly.  Assessment & Plan:   Active Problems:   Essential hypertension   Atrial fibrillation (HCC)   History of CVA (cerebrovascular accident)   Diastolic CHF (HCC)   CKD stage 2 due to type 2 diabetes mellitus (HCC)   Syncope   1-Syncope; Probably vaso-vagal Troponin negative,  orthostatic negative.  CT angio negative for PE>  Will continue with coumadin for now.   2-New DVT Right Lower extremity;  Was on coumadin prior to admission, sub-therapeutic.  INR therapeutic.  Will received a dose of Lovenox today prior to discharge.   3-A fib; was on coumadin prior to admission.   Hiatal hernia; continue with PPI.   HTN;  Continue with metoprolol.  Resume cozaar at discharge.  Resume lasix .   Urinary retention;  Urine function stable.  She will be discharge later today if she is able to urinate.   Discharge Diagnoses:  Active Problems:   Essential hypertension   Atrial fibrillation (HCC)   History of CVA (cerebrovascular accident)   Diastolic CHF (HCC)   CKD stage 2 due to type 2 diabetes mellitus (HCC)   Syncope    Discharge Instructions  Discharge Instructions    Diet - low sodium heart healthy   Complete by:  As directed    Increase activity slowly   Complete by:  As directed      Allergies as of 03/23/2018      Reactions   Ace Inhibitors Other (See Comments)   Cough   Fosamax [alendronate Sodium] Other (See Comments)   Heart burn   Norvasc [amlodipine Besylate] Other (See Comments)   edema   Zocor [simvastatin] Other (See Comments)   Elevated CPK      Medication List    TAKE these medications   CINNAMON PO Take 1 capsule by mouth daily.   dicyclomine 20 MG tablet Commonly known as:  BENTYL Take  1 tablet 3 x day if needed for nausea, bloating , cramping or diarrhea What changed:    how much to take  how to take this  when to take this  reasons to take this  additional instructions   furosemide 20 MG tablet Commonly known as:  LASIX Take 1 tablet (20 mg total) by mouth daily.   losartan 50 MG tablet Commonly known as:  COZAAR Take 0.5 tablets (25 mg total) by mouth daily.   MAGNESIA PO Take 1 tablet by mouth daily.   metoprolol tartrate 25 MG tablet Commonly known as:  LOPRESSOR Take 0.5 tablets (12.5  mg total) by mouth 2 (two) times daily.   ondansetron 4 MG disintegrating tablet Commonly known as:  ZOFRAN-ODT Take 1 tablet (4 mg total) by mouth every 8 (eight) hours as needed for nausea or vomiting.   pantoprazole 40 MG tablet Commonly known as:  PROTONIX Take 1 tablet daily for Heartburn & Acid Indigestion What changed:    how much to take  how to take this  when to take this  additional instructions   potassium chloride 10 MEQ tablet Commonly known as:  K-DUR Take 1 tablet (10 mEq total) by mouth daily. What changed:  when to take this   QUEtiapine 25 MG tablet Commonly known as:  SEROQUEL Take 0.5 tablets (12.5 mg total) by mouth at bedtime. What changed:    how much to take  how to take this  when to take this  additional instructions   sucralfate 1 g tablet Commonly known as:  CARAFATE Take 1 tablet (1 g total) by mouth 4 (four) times daily as needed for up to 15 days. What changed:  when to take this   Vitamin D 2000 units Caps Take 2,000 Units by mouth daily.   warfarin 2 MG tablet Commonly known as:  COUMADIN Take as directed. If you are unsure how to take this medication, talk to your nurse or doctor. Original instructions:  TAKE AS DIRECTED BY ANTICOAGULATION CLINIC. What changed:  See the new instructions.       Allergies  Allergen Reactions  . Ace Inhibitors Other (See Comments)    Cough  . Fosamax [Alendronate Sodium] Other (See Comments)    Heart burn  . Norvasc [Amlodipine Besylate] Other (See Comments)    edema  . Zocor [Simvastatin] Other (See Comments)    Elevated CPK    Consultations:  none   Procedures/Studies: Dg Chest 2 View  Result Date: 03/21/2018 CLINICAL DATA:  Larey Seat syncopal episode EXAM: CHEST - 2 VIEW COMPARISON:  Portable chest x-ray of 12/02/2016 FINDINGS: The opacity at the left lung base appears to be due to a very large hiatal hernia with some volume loss at the left lung base. No pneumonia or effusion is  seen. Cardiomegaly is stable. The bones are diffusely osteopenic. IMPRESSION: 1. Large hiatal hernia. 2. Stable cardiomegaly. Electronically Signed   By: Dwyane Dee M.D.   On: 03/21/2018 12:15   Dg Tibia/fibula Right  Result Date: 03/21/2018 CLINICAL DATA:  Right ankle pain after fall. EXAM: RIGHT TIBIA AND FIBULA - 2 VIEW COMPARISON:  None. FINDINGS: There is no evidence of fracture or other focal bone lesions. Soft tissues are unremarkable. IMPRESSION: Negative. Electronically Signed   By: Lupita Raider, M.D.   On: 03/21/2018 15:27   Ct Head Wo Contrast  Result Date: 03/21/2018 CLINICAL DATA:  Unwitnessed fall. EXAM: CT HEAD WITHOUT CONTRAST TECHNIQUE: Contiguous axial images were obtained from the  base of the skull through the vertex without intravenous contrast. COMPARISON:  Head CT 04/14/2017 FINDINGS: Brain: Chronic findings of encephalomalacia in the periventricular LEFT frontal lobe and RIGHT cerebellum. Chronic atrophy and periventricular white matter disease. No acute intracranial hemorrhage. No focal mass lesion. No CT evidence of acute infarction. No midline shift or mass effect. No hydrocephalus. Basilar cisterns are patent. Vascular: No hyperdense vessel or unexpected calcification. Skull: Normal. Negative for fracture or focal lesion. Sinuses/Orbits: Paranasal sinuses and mastoid air cells are clear. Orbits are clear. Other: None. IMPRESSION: 1. No acute intracranial findings.  No evidence trauma. 2. Chronic infarctions as above. Electronically Signed   By: Genevive Bi M.D.   On: 03/21/2018 12:18   Ct Angio Chest Pe W Or Wo Contrast  Result Date: 03/22/2018 CLINICAL DATA:  Shortness of breath. EXAM: CT ANGIOGRAPHY CHEST WITH CONTRAST TECHNIQUE: Multidetector CT imaging of the chest was performed using the standard protocol during bolus administration of intravenous contrast. Multiplanar CT image reconstructions and MIPs were obtained to evaluate the vascular anatomy. CONTRAST:   ISOVUE-370 IOPAMIDOL (ISOVUE-370) INJECTION 76% COMPARISON:  03/21/2018 chest x-ray and 03/16/2018 CT of the abdomen and pelvis FINDINGS: Cardiovascular: There is atherosclerotic calcification of the coronary vessels and thoracic aorta. Ascending aorta is 3.1 centimeters. Pulmonary arteries are well opacified. There is no acute pulmonary embolus. The heart size is mildly enlarged. No pericardial effusion. Mediastinum/Nodes: There is a sub centimeter low-density nodule within the RIGHT lobe of the thyroid gland. There is distension of the internal jugular veins, RIGHT greater than LEFT. Intrathoracic stomach without change. Lungs/Pleura: There is streaky opacity in the MEDIAL LEFT lung base, likely related to atelectasis from the intrathoracic stomach. There are no pleural effusions. Within the LEFT UPPER lobe there is a 4 biliary meter nodule best seen on image 28 of series 6. There is biapical pleuroparenchymal change. No pulmonary edema. Upper Abdomen: Cholecystectomy. There is atherosclerotic calcification of the abdominal aorta and its branches. Intrathoracic stomach, unchanged. Musculoskeletal: No chest wall abnormality. No acute or significant osseous findings. Review of the MIP images confirms the above findings. IMPRESSION: 1. Technically adequate exam showing no acute pulmonary embolus. 2. Cardiomegaly and coronary artery disease. 3.  Aortic atherosclerosis.  (ICD10-I70.0).  No associated aneurysm. 4. Intrathoracic stomach without change. 5. LEFT LOWER lobe atelectasis. 6. Cholecystectomy. Electronically Signed   By: Norva Pavlov M.D.   On: 03/22/2018 14:36   Mr Brain Wo Contrast  Result Date: 03/21/2018 CLINICAL DATA:  84 y/o  F; syncope and unwitnessed fall. EXAM: MRI HEAD WITHOUT CONTRAST TECHNIQUE: Multiplanar, multiecho pulse sequences of the brain and surrounding structures were obtained without intravenous contrast. COMPARISON:  03/21/2018 CT head. FINDINGS: Brain: No acute infarction,  hemorrhage, hydrocephalus, extra-axial collection or mass lesion. Right inferior cerebellar chronic infarction. Chronic left anterolateral frontal, left anterior basal ganglia, and anterior insula infarction. Small chronic lacunar infarct within the left hemi pons. Patchy nonspecific T2 FLAIR hyperintensities in subcortical and periventricular white matter as well as the pons are compatible with moderate chronic microvascular ischemic changes for age. Moderate volume loss of the brain. Punctate focus of susceptibility hypointensity within the right inferior cerebellum is compatible with hemosiderin deposition of chronic microhemorrhage. Vascular: Normal flow voids. Skull and upper cervical spine: Normal marrow signal. Sinuses/Orbits: Left frontal sinus mucosal thickening. No additional abnormal signal of the paranasal sinuses or mastoid air cells. Bilateral intra-ocular lens replacement. Other: None. IMPRESSION: 1. No acute intracranial abnormality identified. 2. Stable chronic infarcts, volume loss, and microvascular ischemic  changes of the brain as above. Electronically Signed   By: Mitzi Hansen M.D.   On: 03/21/2018 16:31   Ct Abdomen Pelvis W Contrast  Result Date: 03/16/2018 CLINICAL DATA:  Vomiting and nausea EXAM: CT ABDOMEN AND PELVIS WITH CONTRAST TECHNIQUE: Multidetector CT imaging of the abdomen and pelvis was performed using the standard protocol following bolus administration of intravenous contrast. CONTRAST:  OMNIPAQUE IOHEXOL 300 MG/ML  SOLN COMPARISON:  11/24/2017, 06/08/2014 FINDINGS: Lower chest: Large hiatal hernia with intrathoracic stomach. Heart size within normal limits. Mild dependent atelectasis within the left lung base. Hepatobiliary: Status post cholecystectomy. Dilated common bile duct similar compared to prior. No focal hepatic abnormality. Pancreas: Unremarkable. No pancreatic ductal dilatation or surrounding inflammatory changes. Spleen: Normal in size without  focal abnormality. Adrenals/Urinary Tract: Small cortical defect mid left kidney. No hydronephrosis. Adrenal glands are normal. The bladder is unremarkable Stomach/Bowel: Intrathoracic stomach which is fluid-filled. No dilated small bowel. No colon wall thickening. Sigmoid colon diverticular disease. Vascular/Lymphatic: Nonaneurysmal aorta. Mild aortic atherosclerosis. No significant adenopathy. Reproductive: Calcified uterine fibroids.  No adnexal masses. Other: Negative for free air or free fluid. Musculoskeletal: Degenerative changes of the spine. No acute or suspicious abnormality. IMPRESSION: 1. Large hiatal hernia with intrathoracic stomach. No evidence for a small bowel obstruction. 2. Status post cholecystectomy with biliary dilatation, unchanged and likely due to surgical change. 3. Calcified uterine fibroids. Electronically Signed   By: Jasmine Pang M.D.   On: 03/16/2018 01:52     Subjective: Alert, denies abdominal pain  Discharge Exam: Vitals:   03/23/18 0924 03/23/18 1217  BP: 140/80 (!) 154/90  Pulse: 82 64  Resp:  18  Temp:  (!) 97.5 F (36.4 C)  SpO2:  94%   Vitals:   03/23/18 0036 03/23/18 0518 03/23/18 0924 03/23/18 1217  BP: 136/80 (!) 152/88 140/80 (!) 154/90  Pulse: 63 76 82 64  Resp: 20 20  18   Temp: (!) 97.5 F (36.4 C) (!) 97.4 F (36.3 C)  (!) 97.5 F (36.4 C)  TempSrc: Oral Oral  Oral  SpO2: 94% 95%  94%  Weight:  50.8 kg    Height:        General: Pt is alert, awake, not in acute distress Cardiovascular: RRR, S1/S2 +, no rubs, no gallops Respiratory: CTA bilaterally, no wheezing, no rhonchi Abdominal: Soft, NT, ND, bowel sounds + Extremities: no edema, no cyanosis    The results of significant diagnostics from this hospitalization (including imaging, microbiology, ancillary and laboratory) are listed below for reference.     Microbiology: Recent Results (from the past 240 hour(s))  Urine culture     Status: Abnormal   Collection Time: 03/21/18  12:27 PM  Result Value Ref Range Status   Specimen Description URINE, RANDOM  Final   Special Requests   Final    NONE Performed at [facility], [address], [place], [place] [address]    Culture MULTIPLE SPECIES PRESENT, SUGGEST RECOLLECTION (A)  Final   Report Status 03/22/2018 FINAL  Final  MRSA PCR Screening     Status: None   Collection Time: 03/21/18  8:29 PM  Result Value Ref Range Status   MRSA by PCR NEGATIVE NEGATIVE Final    Comment:        The GeneXpert MRSA Assay (FDA approved for NASAL specimens only), is one component of a comprehensive MRSA colonization surveillance program. It is not intended to diagnose MRSA infection nor to guide or monitor treatment for MRSA  infections. Performed at [facility], [address], [place], [place] [address]      Labs: BNP (last 3 results) No results for input(s): BNP in the last 8760 hours. Basic Metabolic Panel: Recent Labs  Lab 03/21/18 1041 03/22/18 0256 03/23/18 1422  NA 139 140 138  K 4.3 3.8 3.7  CL 107 105 108  CO2 24 23 23   GLUCOSE 125* 107* 95  BUN 12 13 12   CREATININE 0.88 0.86 0.80  CALCIUM 8.8* 9.4 9.1   Liver Function Tests: Recent Labs  Lab 03/21/18 1041 03/22/18 0256  AST 30 18  ALT 15 12  ALKPHOS 101 97  BILITOT 1.5* 0.8  PROT 6.5 6.1*  ALBUMIN 3.2* 3.0*   No results for input(s): LIPASE, AMYLASE in the last 168 hours. No results for input(s): AMMONIA in the last 168 hours. CBC: Recent Labs  Lab 03/21/18 1041 03/22/18 0256 03/23/18 0640  WBC 7.7 6.8 6.4  NEUTROABS 5.4  --   --   HGB 12.7 12.7 12.9  HCT 40.4 40.6 41.2  MCV 92.7 90.6 90.4  PLT 227 265 248   Cardiac Enzymes: Recent Labs  Lab 03/21/18 1629 03/21/18 2112 03/22/18 0256  TROPONINI <0.03 <0.03 <0.03   BNP: Invalid input(s): POCBNP CBG: Recent Labs  Lab 03/21/18 1110  GLUCAP 114*   D-Dimer No results for input(s): DDIMER in the last 72 hours. Hgb A1c No results for input(s): HGBA1C in  the last 72 hours. Lipid Profile No results for input(s): CHOL, HDL, LDLCALC, TRIG, CHOLHDL, LDLDIRECT in the last 72 hours. Thyroid function studies No results for input(s): TSH, T4TOTAL, T3FREE, THYROIDAB in the last 72 hours.  Invalid input(s): FREET3 Anemia work up No results for input(s): VITAMINB12, FOLATE, FERRITIN, TIBC, IRON, RETICCTPCT in the last 72 hours. Urinalysis    Component Value Date/Time   COLORURINE YELLOW 03/21/2018 1227   APPEARANCEUR CLEAR 03/21/2018 1227   LABSPEC 1.009 03/21/2018 1227   PHURINE 7.0 03/21/2018 1227   GLUCOSEU NEGATIVE 03/21/2018 1227   HGBUR SMALL (A) 03/21/2018 1227   BILIRUBINUR NEGATIVE 03/21/2018 1227   KETONESUR NEGATIVE 03/21/2018 1227   PROTEINUR NEGATIVE 03/21/2018 1227   UROBILINOGEN 1.0 04/02/2015 1244   NITRITE NEGATIVE 03/21/2018 1227   LEUKOCYTESUR NEGATIVE 03/21/2018 1227   Sepsis Labs Invalid input(s): PROCALCITONIN,  WBC,  LACTICIDVEN Microbiology Recent Results (from the past 240 hour(s))  Urine culture     Status: Abnormal   Collection Time: 03/21/18 12:27 PM  Result Value Ref Range Status   Specimen Description URINE, RANDOM  Final   Special Requests   Final    NONE Performed at [facility], [address], [place], [place] [address]    Culture MULTIPLE SPECIES PRESENT, SUGGEST RECOLLECTION (A)  Final   Report Status 03/22/2018 FINAL  Final  MRSA PCR Screening     Status: None   Collection Time: 03/21/18  8:29 PM  Result Value Ref Range Status   MRSA by PCR NEGATIVE NEGATIVE Final    Comment:        The GeneXpert MRSA Assay (FDA approved for NASAL specimens only), is one component of a comprehensive MRSA colonization surveillance program. It is not intended to diagnose MRSA infection nor to guide or monitor treatment for MRSA infections. Performed at [facility], [address] [place], [place] [address]      Time coordinating discharge: 35 minutes.   SIGNED:   Alba Cory,  MD  [facility] 03/23/2018, 3:48 PM  Pager   If 7PM-7AM, please contact night-coverage [email or web address] Password [email or web address]

## 2018-03-23 NOTE — Progress Notes (Signed)
Pt got discharged to [facility], independent living, discharge instructions provided and patient showed understanding to it, IV taken out,Telemonitor DC,pt left unit in wheelchair with all of the belongings accompanied with a family member (Son)  [place], Charity fundraiser

## 2018-03-25 DIAGNOSIS — I824Z1 Acute embolism and thrombosis of unspecified deep veins of right distal lower extremity: Secondary | ICD-10-CM | POA: Insufficient documentation

## 2018-03-25 HISTORY — DX: Acute embolism and thrombosis of unspecified deep veins of right distal lower extremity: I82.4Z1

## 2018-03-25 NOTE — Progress Notes (Addendum)
Hospital follow up  Assessment and Plan: Hospital visit follow up for: syncope, presumed vasovagal, and acute DVT Hospital discharge meds were reviewed, and reconciled with the patient.   Lisa Hopkins was seen today for hospitalization follow-up.  Diagnoses and all orders for this visit:  Syncope, unspecified syncope type Workup unremarkable, presumed vasovagal Monitor BPs, emphasized hydration, information given on vasovagal syncope -     CBC with Differential/Platelet -     BASIC METABOLIC PANEL WITH GFR  Essential hypertension Continue medications Monitor blood pressure at home; call if consistently over 130/80 Continue DASH diet.   Reminder to go to the ER if any CP, SOB, nausea, dizziness, severe HA, changes vision/speech, left arm numbness and tingling and jaw pain. -     CBC with Differential/Platelet -     BASIC METABOLIC PANEL WITH GFR  Diastolic congestive heart failure, unspecified HF chronicity (HCC) Weights stable, no edema or other symptoms Disease process and medications discussed. Questions answered fully. Emphasized salt restriction, less than 2000mg  a day. Encouraged daily monitoring of the patient's weight, call office if 5 lb weight loss or gain in a day.  Encouraged regular exercise. If any increasing shortness of breath, swelling, or chest pressure go to ER immediately.  decrease your fluid intake to less than 2 L daily please remember to always increase your potassium intake with any increase of your fluid pill.   CKD stage 2 due to type 2 diabetes mellitus (HCC) -     BASIC METABOLIC PANEL WITH GFR  DVT, lower extremity, distal, acute, right (HCC) On Coumadin, continue compression hose Diagnosis discussed and information provided on AVS -     Protime-INR  Atrial fibrillation, unspecified type (HCC) Will check PT per patient family strong request, will have them complete further follow ups with established coumadin clinic/cardiology.  Check INR and will  adjust medication according to labs.  Discussed if patient falls to immediately contact office or go to ER. Discussed foods that can increase or decrease Coumadin levels. Patient understands to call the office before starting a new medication. -     Protime-INR  All medication were reviewed with patient and family and reconciled; no discontinued medications.   Over 40 minutes of exam, counseling, chart review, and complex, high/moderate level critical decision making was performed this visit.   Future Appointments  Date Time Provider Department Center  05/27/2018  9:00 AM Lucky Cowboy, MD [facility] None  02/24/2019  2:00 PM Judd Gaudier, NP [facility] None     HPI 85 y.o.female presents for follow up for transition from recent hospitalization or [facility]IF stay. Admit date to the hospital was 03/21/18, patient was discharged from the hospital on 03/23/18 and our clinical staff contacted the office the day after discharge to set up a follow up appointment. The discharge summary, medications, and diagnostic test results were reviewed before meeting with the patient. The patient was admitted for: syncope (presumed vasovagal), new dx DVT  Lisa Dillingham Foglemanis a 84 y.o.femalewith medical history significant oflarge hiatal hernia, recent admission for nausea vomiting, exacerbation of her hiatal hernia defect, A fib on coumadin (managed by cardiology coumadin clinic), prior CVA, HTN who presented to ED after syncope episode. Patient relates she woke up from a nap, felt "funny," got up and had BM and subsequently passed out on the commode. On arrival to ED she denied any symptoms excepting new right calf pain, but family was concerned as patient experienced syncope with previous stroke. ED course demonstrated sodium 139, k 4.3,  bili 1.5, hb 12, wbc at 7.7, INR 1.7. CT head no acute bleed. UA 0-5, chest x ray stable cardiomegaly and was admitted for workup and observation. Serial troponins,  orthostatics, CT angio were negative for PE, and syncope was attributed to vasovagal etiology, though cozaar and lasix were held while admitted. She was found to have new DVT of RLE in peroneal vein, was on coumadin prior to hospitalization but was found to be subtherapeutic. She was given a dose of lovenox prior to discharge with plan to continue coumadin therapy, previous BP medications cozaar and lasix were restarted.   She has a history of Diastolic CHF (though recent ECHO 12/01/2017 EF was 65-70%, denies dyspnea on exertion, orthopnea, paroxysmal nocturnal dyspnea and edema. Positive for none. Wt Readings from Last 3 Encounters:  03/26/18 114 lb (51.7 kg)  03/23/18 112 lb (50.8 kg)  02/11/18 116 lb 9.6 oz (52.9 kg)    Lab Results  Component Value Date   INR 2.03 03/23/2018   INR 1.66 03/22/2018   INR 1.73 03/21/2018   Home health is involved, she has home health who was established prior to her admission. No new deficits or needs, able to travel to coumadin clinic for INRs. Per family request will check PT/INR today and will have them follow up as indicated with coumadin clinic.   Images while in the hospital: Dg Chest 2 View  Result Date: 03/21/2018 CLINICAL DATA:  Lisa Hopkins syncopal episode EXAM: CHEST - 2 VIEW COMPARISON:  Portable chest x-ray of 12/02/2016 FINDINGS: The opacity at the left lung base appears to be due to a very large hiatal hernia with some volume loss at the left lung base. No pneumonia or effusion is seen. Cardiomegaly is stable. The bones are diffusely osteopenic. IMPRESSION: 1. Large hiatal hernia. 2. Stable cardiomegaly. Electronically Signed   By: Dwyane Dee M.D.   On: 03/21/2018 12:15   Dg Tibia/fibula Right  Result Date: 03/21/2018 CLINICAL DATA:  Right ankle pain after fall. EXAM: RIGHT TIBIA AND FIBULA - 2 VIEW COMPARISON:  None. FINDINGS: There is no evidence of fracture or other focal bone lesions. Soft tissues are unremarkable. IMPRESSION: Negative.  Electronically Signed   By: Lupita Raider, M.D.   On: 03/21/2018 15:27   Ct Head Wo Contrast  Result Date: 03/21/2018 CLINICAL DATA:  Unwitnessed fall. EXAM: CT HEAD WITHOUT CONTRAST TECHNIQUE: Contiguous axial images were obtained from the base of the skull through the vertex without intravenous contrast. COMPARISON:  Head CT 04/14/2017 FINDINGS: Brain: Chronic findings of encephalomalacia in the periventricular LEFT frontal lobe and RIGHT cerebellum. Chronic atrophy and periventricular white matter disease. No acute intracranial hemorrhage. No focal mass lesion. No CT evidence of acute infarction. No midline shift or mass effect. No hydrocephalus. Basilar cisterns are patent. Vascular: No hyperdense vessel or unexpected calcification. Skull: Normal. Negative for fracture or focal lesion. Sinuses/Orbits: Paranasal sinuses and mastoid air cells are clear. Orbits are clear. Other: None. IMPRESSION: 1. No acute intracranial findings.  No evidence trauma. 2. Chronic infarctions as above. Electronically Signed   By: Genevive Bi M.D.   On: 03/21/2018 12:18   Ct Angio Chest Pe W Or Wo Contrast  Result Date: 03/22/2018 CLINICAL DATA:  Shortness of breath. EXAM: CT ANGIOGRAPHY CHEST WITH CONTRAST TECHNIQUE: Multidetector CT imaging of the chest was performed using the standard protocol during bolus administration of intravenous contrast. Multiplanar CT image reconstructions and MIPs were obtained to evaluate the vascular anatomy. CONTRAST:  ISOVUE-370 IOPAMIDOL (ISOVUE-370) INJECTION  76% COMPARISON:  03/21/2018 chest x-ray and 03/16/2018 CT of the abdomen and pelvis FINDINGS: Cardiovascular: There is atherosclerotic calcification of the coronary vessels and thoracic aorta. Ascending aorta is 3.1 centimeters. Pulmonary arteries are well opacified. There is no acute pulmonary embolus. The heart size is mildly enlarged. No pericardial effusion. Mediastinum/Nodes: There is a sub centimeter low-density  nodule within the RIGHT lobe of the thyroid gland. There is distension of the internal jugular veins, RIGHT greater than LEFT. Intrathoracic stomach without change. Lungs/Pleura: There is streaky opacity in the MEDIAL LEFT lung base, likely related to atelectasis from the intrathoracic stomach. There are no pleural effusions. Within the LEFT UPPER lobe there is a 4 biliary meter nodule best seen on image 28 of series 6. There is biapical pleuroparenchymal change. No pulmonary edema. Upper Abdomen: Cholecystectomy. There is atherosclerotic calcification of the abdominal aorta and its branches. Intrathoracic stomach, unchanged. Musculoskeletal: No chest wall abnormality. No acute or significant osseous findings. Review of the MIP images confirms the above findings. IMPRESSION: 1. Technically adequate exam showing no acute pulmonary embolus. 2. Cardiomegaly and coronary artery disease. 3.  Aortic atherosclerosis.  (ICD10-I70.0).  No associated aneurysm. 4. Intrathoracic stomach without change. 5. LEFT LOWER lobe atelectasis. 6. Cholecystectomy. Electronically Signed   By: Norva Pavlov M.D.   On: 03/22/2018 14:36   Mr Brain Wo Contrast  Result Date: 03/21/2018 CLINICAL DATA:  84 y/o  F; syncope and unwitnessed fall. EXAM: MRI HEAD WITHOUT CONTRAST TECHNIQUE: Multiplanar, multiecho pulse sequences of the brain and surrounding structures were obtained without intravenous contrast. COMPARISON:  03/21/2018 CT head. FINDINGS: Brain: No acute infarction, hemorrhage, hydrocephalus, extra-axial collection or mass lesion. Right inferior cerebellar chronic infarction. Chronic left anterolateral frontal, left anterior basal ganglia, and anterior insula infarction. Small chronic lacunar infarct within the left hemi pons. Patchy nonspecific T2 FLAIR hyperintensities in subcortical and periventricular white matter as well as the pons are compatible with moderate chronic microvascular ischemic changes for age. Moderate volume  loss of the brain. Punctate focus of susceptibility hypointensity within the right inferior cerebellum is compatible with hemosiderin deposition of chronic microhemorrhage. Vascular: Normal flow voids. Skull and upper cervical spine: Normal marrow signal. Sinuses/Orbits: Left frontal sinus mucosal thickening. No additional abnormal signal of the paranasal sinuses or mastoid air cells. Bilateral intra-ocular lens replacement. Other: None. IMPRESSION: 1. No acute intracranial abnormality identified. 2. Stable chronic infarcts, volume loss, and microvascular ischemic changes of the brain as above. Electronically Signed   By: Mitzi Hansen M.D.   On: 03/21/2018 16:31    Past Medical History:  Diagnosis Date  . Anxiety   . Aphasia as late effect of cerebrovascular accident   . Atrial fibrillation (HCC)   . CKD (chronic kidney disease), stage II   . CVA (cerebral infarction)   . Diabetes mellitus type II   . GERD (gastroesophageal reflux disease)   . Hiatal hernia   . HTN (hypertension)   . Hyperlipidemia   . IBS (irritable bowel syndrome)   . Stroke (HCC)   . Vitamin D deficiency      Allergies  Allergen Reactions  . Ace Inhibitors Other (See Comments)    Cough  . Fosamax [Alendronate Sodium] Other (See Comments)    Heart burn  . Norvasc [Amlodipine Besylate] Other (See Comments)    edema  . Zocor [Simvastatin] Other (See Comments)    Elevated CPK      Current Outpatient Medications on File Prior to Visit  Medication Sig Dispense Refill  .  Cholecalciferol (VITAMIN D) 2000 UNITS CAPS Take 2,000 Units by mouth daily.     Marland Kitchen CINNAMON PO Take 1 capsule by mouth daily.    Marland Kitchen dicyclomine (BENTYL) 20 MG tablet Take 1 tablet 3 x day if needed for nausea, bloating , cramping or diarrhea (Patient taking differently: Take 20 mg by mouth 3 (three) times daily as needed for spasms. ) 90 tablet 1  . furosemide (LASIX) 20 MG tablet Take 1 tablet (20 mg total) by mouth daily. 30 tablet 3   . losartan (COZAAR) 50 MG tablet Take 0.5 tablets (25 mg total) by mouth daily. 1 tablet 0  . Magnesium Hydroxide (MAGNESIA PO) Take 1 tablet by mouth daily.     . metoprolol tartrate (LOPRESSOR) 25 MG tablet Take 0.5 tablets (12.5 mg total) by mouth 2 (two) times daily. 180 tablet 3  . ondansetron (ZOFRAN ODT) 4 MG disintegrating tablet Take 1 tablet (4 mg total) by mouth every 8 (eight) hours as needed for nausea or vomiting. 20 tablet 0  . pantoprazole (PROTONIX) 40 MG tablet Take 1 tablet daily for Heartburn & Acid Indigestion (Patient taking differently: Take 40 mg by mouth daily. ) 90 tablet 3  . potassium chloride (K-DUR) 10 MEQ tablet Take 1 tablet (10 mEq total) by mouth daily. (Patient taking differently: Take 10 mEq by mouth every evening. ) 90 tablet 3  . QUEtiapine (SEROQUEL) 25 MG tablet Take 0.5 tablets (12.5 mg total) by mouth at bedtime. 1 tablet 0  . sucralfate (CARAFATE) 1 g tablet Take 1 tablet (1 g total) by mouth 4 (four) times daily as needed for up to 15 days. (Patient taking differently: Take 1 g by mouth as needed. ) 120 tablet 0  . warfarin (COUMADIN) 2 MG tablet TAKE AS DIRECTED BY ANTICOAGULATION CLINIC. (Patient taking differently: Take 1-2 mg by mouth See admin instructions. Take 1 mg tablet on Tuesday and Thursday then take 2 mg tablet all the other days. Take in the am) 30 tablet 3   No current facility-administered medications on file prior to visit.     ROS: all negative except above.   Physical Exam: Filed Weights   03/26/18 1549  Weight: 114 lb (51.7 kg)   BP 138/70   Pulse 62   Temp 97.7 F (36.5 C)   Ht 5' 1.5" (1.562 m)   Wt 114 lb (51.7 kg)   SpO2 99%   BMI 21.19 kg/m  General Appearance: Well nourished, in no apparent distress. Eyes: PERRLA, EOMs, conjunctiva no swelling or erythema Sinuses: No Frontal/maxillary tenderness ENT/Mouth: Ext aud canals clear, TMs without erythema, bulging. No erythema, swelling, or exudate on post pharynx.   Tonsils not swollen or erythematous. Hearing normal.  Neck: Supple, thyroid normal.  Respiratory: Respiratory effort normal, BS equal bilaterally without rales, rhonchi, wheezing or stridor.  Cardio: RRR with no MRGs. Brisk peripheral pulses without edema.  Abdomen: Soft, + BS.  Non tender, no guarding, rebound, hernias, masses. Lymphatics: Non tender without lymphadenopathy.  Musculoskeletal: Full ROM, symmetrical strength, slow steady gait.  Skin: Warm, dry without rashes, lesions, ecchymosis.  Neuro: Cranial nerves intact. Normal muscle tone, no cerebellar symptoms. Sensation intact. Some stuttering and expressive difficulty. Psych: Awake and oriented X 3, somewhat flat affect affect, Insight and Judgment fair.     Dan Maker, NP 4:21 PM [facility]

## 2018-03-26 ENCOUNTER — Encounter: Payer: Self-pay | Admitting: Adult Health

## 2018-03-26 ENCOUNTER — Ambulatory Visit (INDEPENDENT_AMBULATORY_CARE_PROVIDER_SITE_OTHER): Payer: Medicare Other | Admitting: Adult Health

## 2018-03-26 ENCOUNTER — Other Ambulatory Visit: Payer: Self-pay | Admitting: Adult Health

## 2018-03-26 VITALS — BP 138/70 | HR 62 | Temp 97.7°F | Ht 61.5 in | Wt 114.0 lb

## 2018-03-26 DIAGNOSIS — R55 Syncope and collapse: Secondary | ICD-10-CM

## 2018-03-26 DIAGNOSIS — E1122 Type 2 diabetes mellitus with diabetic chronic kidney disease: Secondary | ICD-10-CM | POA: Diagnosis not present

## 2018-03-26 DIAGNOSIS — I10 Essential (primary) hypertension: Secondary | ICD-10-CM

## 2018-03-26 DIAGNOSIS — I4891 Unspecified atrial fibrillation: Secondary | ICD-10-CM

## 2018-03-26 DIAGNOSIS — I5030 Unspecified diastolic (congestive) heart failure: Secondary | ICD-10-CM | POA: Diagnosis not present

## 2018-03-26 DIAGNOSIS — I824Z1 Acute embolism and thrombosis of unspecified deep veins of right distal lower extremity: Secondary | ICD-10-CM | POA: Diagnosis not present

## 2018-03-26 DIAGNOSIS — N182 Chronic kidney disease, stage 2 (mild): Secondary | ICD-10-CM

## 2018-03-26 NOTE — Patient Instructions (Signed)
How to Use Compression Stockings Compression stockings are elastic socks that squeeze the legs. They help to increase blood flow to the legs, decrease swelling in the legs, and reduce the chance of developing blood clots in the lower legs. Compression stockings are often used by people who:  Are recovering from surgery.  Have poor circulation in their legs.  Are prone to getting blood clots in their legs.  Have varicose veins.  Sit or stay in bed for long periods of time.  How to use compression stockings Before you put on your compression stockings:  Make sure that they are the correct size. If you do not know your size, ask your health care provider.  Make sure that they are clean, dry, and in good condition.  Check them for rips and tears. Do not put them on if they are ripped or torn.  Put your stockings on first thing in the morning, before you get out of bed. Keep them on for as long as your health care provider advises. When you are wearing your stockings:  Keep them as smooth as possible. Do not allow them to bunch up. It is especially important to prevent the stockings from bunching up around your toes or behind your knees.  Do not roll the stockings downward and leave them rolled down. This can decrease blood flow to your leg.  Change them right away if they become wet or dirty.  When you take off your stockings, inspect your legs and feet. Anything that does not seem normal may require medical attention. Look for:  Open sores.  Red spots.  Swelling.  Information and tips  Do not stop wearing your compression stockings without talking to your health care provider first.  Wash your stockings every day with mild detergent in cold or warm water. Do not use bleach. Air-dry your stockings or dry them in a clothes dryer on low heat.  Replace your stockings every 3-6 months.  If skin moisturizing is part of your treatment plan, apply lotion or cream at night so that  your skin will be dry when you put on the stockings in the morning. It is harder to put the stockings on when you have lotion on your legs or feet. Contact a health care provider if: Remove your stockings and seek medical care if:  You have a feeling of pins and needles in your feet or legs.  You have any new changes in your skin.  You have skin lesions that are getting worse.  You have swelling or pain that is getting worse.  Get help right away if:  You have numbness or tingling in your lower legs that does not get better right after you take the stockings off.  Your toes or feet become cold and blue.  You develop open sores or red spots on your legs that do not go away.  You see or feel a warm spot on your leg.  You have new swelling or soreness in your leg.  You are short of breath or you have chest pain for no reason.  You have a rapid or irregular heartbeat.  You feel light-headed or dizzy. This information is not intended to replace advice given to you by your health care provider. Make sure you discuss any questions you have with your health care provider. Document Released: 03/12/2009 Document Revised: 10/13/2015 Document Reviewed: 04/22/2014 Elsevier Interactive Patient Education  2018 [employer].    Vasovagal Syncope, Adult Syncope, which is commonly  known as fainting or passing out, is a temporary loss of consciousness. It occurs when the blood flow to the brain is reduced. Vasovagal syncope, also called neurocardiogenic syncope, is a fainting spell that happens when blood flow to the brain is reduced because of a sudden drop in heart rate and blood pressure. Vasovagal syncope is usually harmless. However, you can get injured if you fall during a fainting spell. What are the causes? This condition is caused by a drop in heart rate and blood pressure, usually in response to a trigger. Many things and situations can trigger an episode,  including:  Pain.  Fear.  The sight of blood. This may occur during medical procedures, such as when blood is being drawn from a vein.  Common activities, such as coughing, swallowing, stretching, or going to the bathroom.  Emotional stress.  Being in a confined space.  Prolonged standing, especially in a warm environment.  Lack of sleep or rest.  Not eating for a long time.  Not drinking enough liquids.  Recent illness.  Drinking alcohol.  Taking drugs that affect blood pressure, such as marijuana, cocaine, opiates, or inhalants.  What are the signs or symptoms? Before a fainting episode, you may:  Feel dizzy or light-headed.  Become pale.  Sense that you are going to faint.  Feel like the room is spinning.  Only see directly ahead (tunnel vision).  Feel sick to your stomach (nauseous).  See spots.  Slowly lose vision.  Hear ringing in your ears.  Have a headache.  Feel warm and sweaty.  Feel a sensation of pins and needles.  During the fainting spell, you may twitch or make jerky movements. Fainting spells usually last no longer than a few minutes before you wake up. If you get up too quickly before your body can recover, you may faint again. How is this diagnosed? This condition is diagnosed based on your symptoms, your medical history, and a physical exam. Tests may be done to rule out other causes of fainting. Tests may include:  Blood tests.  Heart tests, such as an electrocardiogram (ECG), echocardiogram, or electrophysiology study.  A test to check your response to changes in position (tilt table test).  How is this treated? Usually, treatment is not needed for this condition. Your health care provider may suggest ways to help prevent fainting episodes. These may include:  Drinking additional fluids if you are exposed to a trigger.  Sitting or lying down if you notice signs that an episode is coming.  If your fainting spells continue,  your health care provider may recommend that you:  Take medicines to prevent fainting or to help reduce further episodes of fainting.  Do certain exercises.  Wear compression stockings.  Have surgery to place a pacemaker in your body (rare).  Follow these instructions at home:  Learn to identify the signs that an episode is coming.  Sit or lie down at the first sign of a fainting spell. If you sit down, put your head down between your legs. If you lie down, swing your legs up in the air to increase blood flow to the brain.  Avoid hot tubs and saunas.  Avoid standing for a long time. If you have to stand for a long time, try: ? Crossing your legs. ? Flexing and stretching your leg muscles. ? Squatting. ? Moving your legs. ? Bending over.  Drink enough fluid to keep your urine clear or pale yellow.  Make changes to your  diet that your health care provider recommends. You may be told to: ? Avoid caffeine. ? Eat more salt.  Take over-the-counter and prescription medicines only as told by your health care provider. Contact a health care provider if:  You continue to have fainting spells despite treatment.  You faint more often despite treatment.  You lose consciousness for more than a few minutes.  You faint during or after exercising or after being startled.  You have twitching or jerky movements for longer than a few seconds during a fainting spell.  You have an episode of twitching or jerky movements without fainting. Get help right away if:  A fainting spell leads to an injury or bleeding.  You have new symptoms that occur with the fainting spells, such as: ? Shortness of breath. ? Chest pain. ? Irregular heartbeat.  You twitch or make jerky movements for more than 5 minutes.  You twitch or make jerky movements during more than one fainting spell. This information is not intended to replace advice given to you by your health care provider. Make sure you discuss  any questions you have with your health care provider. Document Released: 05/01/2012 Document Revised: 10/27/2015 Document Reviewed: 03/13/2015 Elsevier Interactive Patient Education  [employer].

## 2018-03-27 LAB — CBC WITH DIFFERENTIAL/PLATELET
BASOS PCT: 0.8 %
Basophils Absolute: 59 cells/uL (ref 0–200)
Eosinophils Absolute: 148 cells/uL (ref 15–500)
Eosinophils Relative: 2 %
HCT: 41 % (ref 35.0–45.0)
Hemoglobin: 13.6 g/dL (ref 11.7–15.5)
Lymphs Abs: 2945 cells/uL (ref 850–3900)
MCH: 29.2 pg (ref 27.0–33.0)
MCHC: 33.2 g/dL (ref 32.0–36.0)
MCV: 88.2 fL (ref 80.0–100.0)
MONOS PCT: 8.6 %
MPV: 9.7 fL (ref 7.5–12.5)
Neutro Abs: 3611 cells/uL (ref 1500–7800)
Neutrophils Relative %: 48.8 %
PLATELETS: 290 10*3/uL (ref 140–400)
RBC: 4.65 10*6/uL (ref 3.80–5.10)
RDW: 12.7 % (ref 11.0–15.0)
TOTAL LYMPHOCYTE: 39.8 %
WBC mixed population: 636 cells/uL (ref 200–950)
WBC: 7.4 10*3/uL (ref 3.8–10.8)

## 2018-03-27 LAB — BASIC METABOLIC PANEL WITH GFR
BUN: 12 mg/dL (ref 7–25)
CALCIUM: 10 mg/dL (ref 8.6–10.4)
CHLORIDE: 104 mmol/L (ref 98–110)
CO2: 29 mmol/L (ref 20–32)
Creat: 0.85 mg/dL (ref 0.60–0.88)
GFR, EST AFRICAN AMERICAN: 69 mL/min/{1.73_m2} (ref >=60)
GFR, EST NON AFRICAN AMERICAN: 60 mL/min/{1.73_m2} (ref >=60)
Glucose, Bld: 126 mg/dL — ABNORMAL HIGH (ref 65–99)
POTASSIUM: 4.5 mmol/L (ref 3.5–5.3)
Sodium: 142 mmol/L (ref 135–146)

## 2018-03-27 LAB — PROTIME-INR
INR: 2.1 — ABNORMAL HIGH
PROTHROMBIN TIME: 20.5 s — AB (ref 9.0–11.5)

## 2018-04-10 ENCOUNTER — Other Ambulatory Visit: Payer: Self-pay | Admitting: Internal Medicine

## 2018-04-10 DIAGNOSIS — K219 Gastro-esophageal reflux disease without esophagitis: Secondary | ICD-10-CM

## 2018-04-10 DIAGNOSIS — I5032 Chronic diastolic (congestive) heart failure: Secondary | ICD-10-CM

## 2018-04-10 DIAGNOSIS — K589 Irritable bowel syndrome without diarrhea: Secondary | ICD-10-CM

## 2018-04-10 DIAGNOSIS — I10 Essential (primary) hypertension: Secondary | ICD-10-CM

## 2018-04-10 MED ORDER — ONDANSETRON 4 MG PO TBDP: ORAL_TABLET | ORAL | 3 refills | Status: DC

## 2018-04-10 MED ORDER — POTASSIUM CHLORIDE ER 10 MEQ PO TBCR: EXTENDED_RELEASE_TABLET | ORAL | 3 refills | Status: DC

## 2018-04-10 MED ORDER — SUCRALFATE 1 G PO TABS: ORAL_TABLET | ORAL | 0 refills | Status: DC

## 2018-04-10 MED ORDER — PANTOPRAZOLE SODIUM 40 MG PO TBEC: DELAYED_RELEASE_TABLET | ORAL | 3 refills | Status: DC

## 2018-04-10 MED ORDER — DICYCLOMINE HCL 20 MG PO TABS: ORAL_TABLET | ORAL | 11 refills | Status: DC

## 2018-04-29 ENCOUNTER — Ambulatory Visit (INDEPENDENT_AMBULATORY_CARE_PROVIDER_SITE_OTHER): Payer: Medicare Other | Admitting: *Deleted

## 2018-04-29 ENCOUNTER — Other Ambulatory Visit: Payer: Self-pay | Admitting: Adult Health

## 2018-04-29 DIAGNOSIS — I4891 Unspecified atrial fibrillation: Secondary | ICD-10-CM

## 2018-04-29 DIAGNOSIS — Z5181 Encounter for therapeutic drug level monitoring: Secondary | ICD-10-CM

## 2018-04-29 DIAGNOSIS — Z79899 Other long term (current) drug therapy: Secondary | ICD-10-CM | POA: Diagnosis not present

## 2018-04-29 DIAGNOSIS — Z8673 Personal history of transient ischemic attack (TIA), and cerebral infarction without residual deficits: Secondary | ICD-10-CM | POA: Diagnosis not present

## 2018-04-29 LAB — POCT INR: INR: 1.6 — AB (ref 2.0–3.0)

## 2018-04-29 NOTE — Patient Instructions (Signed)
Description   Today take another 1/2 tablet (already taken 1 tablet) then continue same dosage regimen 2mg  daily except 1mg  on Tuesdays and Saturdays.  Recheck INR in 2 weeks.  Call us at [facility] # [phone], Main # [phone].

## 2018-04-30 ENCOUNTER — Other Ambulatory Visit: Payer: Self-pay | Admitting: Internal Medicine

## 2018-04-30 DIAGNOSIS — R197 Diarrhea, unspecified: Secondary | ICD-10-CM

## 2018-04-30 MED ORDER — CHOLESTYRAMINE LIGHT 4 GM/DOSE PO POWD: ORAL | 12 refills | Status: DC

## 2018-05-01 ENCOUNTER — Telehealth: Payer: Self-pay | Admitting: *Deleted

## 2018-05-01 NOTE — Telephone Encounter (Signed)
A message was left to inform [name] that an RX was sent in by Dr Oneta RackMcKeown for Lisa InchQuestran, due to the patient having diarrhea.

## 2018-05-09 ENCOUNTER — Telehealth: Payer: Self-pay | Admitting: *Deleted

## 2018-05-09 NOTE — Telephone Encounter (Signed)
Rosanne AshingJim, the pt's son, called and stated the pt will be receiving Home Health from [place] [facility]. She gave me the number to call [name] so I could give her orders. Had to leave a message for her to call back regarding this. Will await for her to call back or try her back tomorrow.

## 2018-05-10 NOTE — Telephone Encounter (Signed)
Since haven't heard back from [name] with [facility], [facility] and spoke with Kissa and gave her verbal order to check the INR on Monday 05/13/18 and call to our office at [phone].

## 2018-05-13 ENCOUNTER — Ambulatory Visit (INDEPENDENT_AMBULATORY_CARE_PROVIDER_SITE_OTHER): Payer: Medicare Other | Admitting: Internal Medicine

## 2018-05-13 DIAGNOSIS — Z8673 Personal history of transient ischemic attack (TIA), and cerebral infarction without residual deficits: Secondary | ICD-10-CM

## 2018-05-13 DIAGNOSIS — Z5181 Encounter for therapeutic drug level monitoring: Secondary | ICD-10-CM | POA: Diagnosis not present

## 2018-05-13 DIAGNOSIS — I4891 Unspecified atrial fibrillation: Secondary | ICD-10-CM

## 2018-05-13 LAB — POCT INR: INR: 1.6 — AB (ref 2.0–3.0)

## 2018-05-13 NOTE — Patient Instructions (Signed)
Description   Spoke with [facility] RN and advised pt to take 3mg  today and tomorrow, then start taking 2mg  daily except 1mg  on Tuesdays.  Recheck INR in 10 days. Call us at [facility] # [phone], Main # [phone].

## 2018-05-15 ENCOUNTER — Other Ambulatory Visit: Payer: Self-pay | Admitting: Internal Medicine

## 2018-05-15 DIAGNOSIS — I5032 Chronic diastolic (congestive) heart failure: Secondary | ICD-10-CM

## 2018-05-15 DIAGNOSIS — I10 Essential (primary) hypertension: Secondary | ICD-10-CM

## 2018-05-16 ENCOUNTER — Other Ambulatory Visit: Payer: Self-pay | Admitting: *Deleted

## 2018-05-16 DIAGNOSIS — K589 Irritable bowel syndrome without diarrhea: Secondary | ICD-10-CM

## 2018-05-16 MED ORDER — DICYCLOMINE HCL 20 MG PO TABS: ORAL_TABLET | ORAL | 11 refills | Status: DC

## 2018-05-17 ENCOUNTER — Telehealth: Payer: Self-pay | Admitting: *Deleted

## 2018-05-17 NOTE — Telephone Encounter (Signed)
Patient's son was advised that the Dicyclomine is not covered by her insurance plan.  He States he will pay cash at [facility]( about $30).

## 2018-05-24 ENCOUNTER — Ambulatory Visit (INDEPENDENT_AMBULATORY_CARE_PROVIDER_SITE_OTHER): Payer: Medicare Other | Admitting: Cardiovascular Disease

## 2018-05-24 DIAGNOSIS — Z5181 Encounter for therapeutic drug level monitoring: Secondary | ICD-10-CM | POA: Diagnosis not present

## 2018-05-24 DIAGNOSIS — I4891 Unspecified atrial fibrillation: Secondary | ICD-10-CM

## 2018-05-24 DIAGNOSIS — Z8673 Personal history of transient ischemic attack (TIA), and cerebral infarction without residual deficits: Secondary | ICD-10-CM

## 2018-05-24 LAB — POCT INR: INR: 1.3 — AB (ref 2.0–3.0)

## 2018-05-26 ENCOUNTER — Encounter: Payer: Self-pay | Admitting: Internal Medicine

## 2018-05-26 NOTE — Progress Notes (Signed)
[facility] Lisa CowboyWilliam Baraka Hopkins, M.D.     Dyanne CarrelAmanda R. Steffanie Dunnollier, P.A.-C Judd GaudierAshley Corbett, DNP [facility] [address] [place], [place] [id number] Telephone [phone] Telefax [phone] Annual Screening/Preventative Visit & Comprehensive Evaluation &  Examination     This very nice 84 y.o. WWF  presents for a Screening /Preventative Visit & comprehensive evaluation and management of multiple medical co-morbidities.  Patient has been followed for HTN, ASCVD/TIA's, HLD, T2_DM  Prediabetes  and Vitamin D Deficiency.      HTN predates circa 1992. Patient's BP has been controlled at home and patient denies any cardiac symptoms as chest pain, palpitations, shortness of breath, dizziness or ankle swelling. Today's BP is at goal - 120/82 .  In 2011, she had an embolic CVA and has been on Coumadin since. Sequela of her CVA is persistent  Dysnomia, poor short term memory recall and very limited insight, judgement & cognition. Patient has been repeatedly advised to use her walker and she does not or will not consistently use her walker. Patient is unstable in gait & is considered High fall risk for Coumadin.      Patient's hyperlipidemia is not controlled with diet and medications. Patient denies myalgias or other medication SE's. Last lipids were not at goal and not aggressively treated due to advanced age of 84 yo and anticipated lack of benefit:  Lab Results  Component Value Date   CHOL 187 02/11/2018   HDL 50 (L) 02/11/2018   LDLCALC 110 (H) 02/11/2018   TRIG 156 (H) 02/11/2018   CHOLHDL 3.7 02/11/2018      Patient has hx/o T2_NIDDM /CKD2 (GFR 60 ) since 2003 and is attempting control with diet.   Patient denies reactive hypoglycemic symptoms, visual blurring, diabetic polys or paresthesias. Last A1c was not at goal: Lab Results  Component Value Date   HGBA1C 6.2 (H) 02/11/2018      Finally, patient has history of Vitamin D  Deficiency  ("35" / 2011)  and last Vitamin D was still low: Lab Results  Component Value Date   VD25OH 46 02/11/2018   Current Outpatient Medications on File Prior to Visit  Medication Sig  . VITAMIN D 2000 UNITS  Take 2,000 Units by mouth daily.   . cholestyramine light  4 GM/DOSE powder Take 1 scoop ( 4 gm ) in Water or Juice 2 to 3 x  /day as needed  . CINNAMON Take 1 capsule  daily.  Marland Kitchen. dicyclomine 20 MG tablet Take 1 tablet 3 x day if needed   . Furosemide 20 MG tablet TAKE 1 TABLET EACH DAY.  Marland Kitchen. losartan  50 MG tablet TAKE 1/2 TO 1 TABLET ONCE A DAY.  . Magnesium  Take 1 tablet  daily.   . metoprolol tart 25 MG tablet Take 0.5 tablets  2 times daily.  . ondansetron ODT 4 MG  Dissolve 1 tablet under tongue every 6 to 8 hours if needed   . pantoprazole  40 MG tablet Take 1 tablet daily for Heartburn & Acid Indigestion  . K-DUR 10 MEQ tablet TAKE 1 TABLETDAILY.  Marland Kitchen. QUEtiapine  25 MG tablet Take 0.5 tablets  at bedtime.  . sucralfate  1 g tablet Take 1 tablet dissolved in water 4 x /day before meals & bedtime  . warfarin  2 MG tablet TAKE AS DIRECTED BY COAG CLINIC.    Allergies  Allergen Reactions  . Ace Inhibitors Other (See Comments)    Cough  .  Fosamax [Alendronate Sodium] Other (See Comments)    Heart burn  . Norvasc [Amlodipine Besylate] Other (See Comments)    edema  . Zocor [Simvastatin] Other (See Comments)    Elevated CPK   Past Medical History:  Diagnosis Date  . Anxiety   . Aphasia as late effect of cerebrovascular accident   . Atrial fibrillation (HCC)   . CKD (chronic kidney disease), stage II   . CVA (cerebral infarction)   . Diabetes mellitus type II   . GERD (gastroesophageal reflux disease)   . Hiatal hernia   . HTN (hypertension)   . Hyperlipidemia   . IBS (irritable bowel syndrome)   . Stroke (HCC)   . Vitamin D deficiency    Health Maintenance  Topic Date Due  . OPHTHALMOLOGY EXAM  06/26/2018  . HEMOGLOBIN A1C  08/12/2018  . FOOT EXAM   05/27/2019  . TETANUS/TDAP  02/17/2025  . INFLUENZA VACCINE  Completed  . DEXA SCAN  Completed  . PNA vac Low Risk Adult  Discontinued   Immunization History  Administered Date(s) Administered  . DT 02/18/2015  . Influenza, High Dose Seasonal PF 03/05/2014, 02/18/2015, 03/16/2016, 03/05/2017  . Influenza-Unspecified 01/30/2013, 02/27/2018  . PPD Test 10/26/2017  . Pneumococcal-Unspecified 04/27/2004  . Td 06/28/2003  . Zoster 04/27/2009   Last Colon - 11/25/2005 - Normal   Last MGM -  04/13/2015  Past Surgical History:  Procedure Laterality Date  . CATARACT EXTRACTION, BILATERAL    . CHOLECYSTECTOMY    . COLONOSCOPY  2005  . ESOPHAGOGASTRODUODENOSCOPY (EGD) WITH PROPOFOL N/A 11/28/2017   Procedure: ESOPHAGOGASTRODUODENOSCOPY (EGD) WITH PROPOFOL;  Surgeon: Graylin Shiver, MD;  Location: [facility] ENDOSCOPY;  Service: Endoscopy;  Laterality: N/A;  . TONSILLECTOMY    . UPPER GASTROINTESTINAL ENDOSCOPY  2005   Family History  Problem Relation Age of Onset  . Diabetes Mother   . Stroke Mother   . Hypertension Brother   . Heart disease Brother   . Arthritis Sister        Rhematoid  . Arthritis Sister        Rhematoid   Social History   Tobacco Use  . Smoking status: Never Smoker  . Smokeless tobacco: Never Used  Substance Use Topics  . Alcohol use: No  . Drug use: No    ROS Constitutional: Denies fever, chills, weight loss/gain, headaches, insomnia,  night sweats, and change in appetite. Does c/o fatigue. Eyes: Denies redness, blurred vision, diplopia, discharge, itchy, watery eyes.  ENT: Denies discharge, congestion, post nasal drip, epistaxis, sore throat, earache, hearing loss, dental pain, Tinnitus, Vertigo, Sinus pain, snoring.  Cardio: Denies chest pain, palpitations, irregular heartbeat, syncope, dyspnea, diaphoresis, orthopnea, PND, claudication, edema Respiratory: denies cough, dyspnea, DOE, pleurisy, hoarseness, laryngitis, wheezing.  Gastrointestinal: Denies  dysphagia, heartburn, reflux, water brash, pain, cramps, nausea, vomiting, bloating, diarrhea, constipation, hematemesis, melena, hematochezia, jaundice, hemorrhoids Genitourinary: Denies dysuria, frequency, urgency, nocturia, hesitancy, discharge, hematuria, flank pain Breast: Breast lumps, nipple discharge, bleeding.  Musculoskeletal: Denies arthralgia, myalgia, stiffness, Jt. Swelling, pain, limp, and strain/sprain. Denies falls. Skin: Denies puritis, rash, hives, warts, acne, eczema, changing in skin lesion Neuro: No weakness, tremor, incoordination, spasms, paresthesia, pain Psychiatric: Denies confusion, memory loss, sensory loss. Denies Depression. Endocrine: Denies change in weight, skin, hair change, nocturia, and paresthesia, diabetic polys, visual blurring, hyper / hypo glycemic episodes.  Heme/Lymph: No excessive bleeding, bruising, enlarged lymph nodes.  Physical Exam  BP 120/82   Pulse 76   Temp (!) 97.2 F (36.2 C)  Resp 16   Ht 5' 2.5" (1.588 m)   Wt 115 lb 9.6 oz (52.4 kg)   BMI 20.81 kg/m   General Appearance: Well nourished, well groomed and in no apparent distress.  Eyes: PERRLA, EOMs, conjunctiva no swelling or erythema, normal fundi and vessels. Sinuses: No frontal/maxillary tenderness ENT/Mouth: EACs patent / TMs  nl. Nares clear without erythema, swelling, mucoid exudates. Oral hygiene is good. No erythema, swelling, or exudate. Tongue normal, non-obstructing. Tonsils not swollen or erythematous. Hearing normal.  Neck: Supple, thyroid not palpable. No bruits, nodes or JVD. Respiratory: Respiratory effort normal.  BS equal and clear bilateral without rales, rhonci, wheezing or stridor. Cardio: Heart sounds are normal with regular rate and rhythm and no murmurs, rubs or gallops. Peripheral pulses are normal and equal bilaterally without edema. No aortic or femoral bruits. Chest: symmetric with normal excursions and percussion. Breasts: Symmetric, without lumps,  nipple discharge, retractions, or fibrocystic changes.  Abdomen: Flat, soft with bowel sounds active. Nontender, no guarding, rebound, hernias, masses, or organomegaly.  Lymphatics: Non tender without lymphadenopathy.  Musculoskeletal: Full ROM all peripheral extremities, joint stability, 5/5 strength, and normal gait. Skin: Warm and dry without rashes, lesions, cyanosis, clubbing or  ecchymosis.  Neuro: Cranial nerves intact, reflexes equal bilaterally. Normal muscle tone, no cerebellar symptoms. Sensation intact to touch, vibratory and Monofilament to the toes bilaterally. Pysch: Alert and oriented X 3, normal affect, Insight and Judgment appropriate.   Assessment and Plan  1. Annual Preventative Screening Examination  2. Essential hypertension  - EKG 12-Lead - Urinalysis, Routine w reflex microscopic - Microalbumin / creatinine urine ratio - CBC with Differential/Platelet - COMPLETE METABOLIC PANEL WITH GFR - Magnesium - TSH  3. Hyperlipidemia, mixed  - EKG 12-Lead - TSH  4. Type 2 diabetes mellitus with stage 2 chronic kidney disease, without long-term current use of insulin (HCC)  - EKG 12-Lead - Urinalysis, Routine w reflex microscopic - Microalbumin / creatinine urine ratio - HM DIABETES FOOT EXAM - LOW EXTREMITY NEUR EXAM DOCUM - Hemoglobin A1c - Insulin, random  5. Chronic atrial fibrillation  - EKG 12-Lead - TSH  6. Diastolic congestive heart failure, unspecified HF chronicity (HCC)  - EKG 12-Lead  7. Gastroesophageal reflux disease  - CBC with Differential/Platelet  8. Irritable bowel syndrome with both constipation and diarrhea   9. Dementia without behavioral disturbance  (HCC)   10. CKD stage 2 due to type 2 diabetes mellitus (HCC)  - Urinalysis, Routine w reflex microscopic - Microalbumin / creatinine urine ratio  11. Atherosclerosis of aorta (HCC)  - EKG 12-Lead  12. Vitamin D deficiency  - VITAMIN D 25 Hydroxyl  13. History of CVA  (cerebrovascular accident)   14. FHx: heart disease  - EKG 12-Lead  15. Screening for ischemic heart disease  - EKG 12-Lead  16. Need for assistance due to unsteady gait   17. Medication management  - Urinalysis, Routine w reflex microscopic - Microalbumin / creatinine urine ratio - CBC with Differential/Platelet - COMPLETE METABOLIC PANEL WITH GFR - Magnesium - TSH - Hemoglobin A1c - Insulin, random - VITAMIN D 25 Hydroxyl  18. Encounter for colorectal cancer screening  - POC Hemoccult Bld/Stl         Patient was counseled in prudent diet to achieve/maintain BMI less than 25 for weight control, BP monitoring, regular exercise and medications. Discussed med's effects and SE's. Screening labs and tests as requested with regular follow-up as recommended. Over 40 minutes of exam, counseling, chart review  and high complex critical decision making was performed.

## 2018-05-26 NOTE — Patient Instructions (Signed)
Bleeding Precautions When on Anticoagulant Therapy (Coumadin / Warfarin)   Anticoagulant therapy, also called blood thinner therapy, is medicine that helps to prevent and treat blood clots. The medicine works by stopping blood clots from forming or growing. Blood clots that form in your blood vessels can be dangerous. They can break loose and travel to the heart, lungs, or brain. This increases the risk of a heart attack, stroke, or blocked lung artery (pulmonary embolism). Anticoagulants also increase the risk of bleeding. Try to protect yourself from cuts and other injuries that can cause bleeding. It is important to take anticoagulants exactly as told by your health care provider. Why do I need to be on anticoagulant therapy? You may need this medicine if you are at risk of developing a blood clot. Conditions that increase your risk of a blood clot include:    Developing heart disease.    Having a high risk of stroke or heart attack.    Having atrial fibrillation (AF). What are the common anticoagulant medicines?  There are several types of anticoagulant medicines. The most common types is:   ? Coumadin (Warfarin)   What do I need to remember while on anticoagulant therapy?  Taking anticoagulants    Take your medicine at the same time every day. If you forget to take your medicine, take it as soon as you remember. Do not double your dosage of medicine if you miss a whole day. Take your normal dose and call your health care provider.  Do not stop taking your medicine unless your health care provider approves. Stopping the medicine can increase your risk of developing a blood clot.   Taking other medicines   Take over-the-counter and prescriptions medicines only as told by your health care provider.  Do not take over-the-counter NSAIDs, including aspirin and ibuprofen, while you are on anticoagulant therapy. These medicines increase your risk of dangerous bleeding.  Get  approval from your health care provider before you start taking any new medicines, vitamins, or herbal products. Some of these could interfere with your therapy.   General instructions   Keep all follow-up visits as told by your health care provider. This is important.  If you are pregnant or trying to get pregnant, talk with a health care provider about anticoagulants. Some of these medicines are not safe to take during pregnancy.  Tell all health care providers, including your dentist, that you are on anticoagulant therapy. It is especially important to tell providers before you have any surgery, medical procedures, or dental work done.   What precautions should I take?   Be very careful when using knives, scissors, or other sharp objects.  Use an electric razor instead of a blade.  Do not use toothpicks.  Use a soft-bristled toothbrush. Brush your teeth gently.  Always wear shoes outdoors and wear slippers indoors.  Be careful when cutting your fingernails and toenails.  Place bath mats in the bathroom. If possible, install handrails as well.  Wear gloves while you do yard work.  Wear your seat belt.  Prevent falls by removing loose rugs and extension cords from areas where you walk. Use a cane or walker if you need it.  Avoid constipation by: ? Drinking enough fluid to keep your urine clear or pale yellow. ? Eating foods that are high in fiber, such as fresh fruits and vegetables, whole grains, and beans. ? Limiting foods that are high in fat and processed sugars, such as fried and sweet foods.  What other precautions are important if on warfarin therapy?  If you are taking a type of anticoagulant called warfarin, make sure you:   Do not drink alcohol. It can interfere with your medicine and increase your risk of an injury that causes bleeding.   Get regular blood tests as told by your health care provider.   What are some questions to ask my health care  provider?   What are the side effects of anticoagulant therapy?    When should I take my medicine? What should I do if I forget to take it?  Will I need to have regular blood tests?  Do I need to change my diet? Are there foods or drinks that I should avoid?    What activities are safe for me?   Contact a health care provider if:   You miss a dose of medicine: ? And you are not sure what to do.  ? You have: ?  ? Menstrual bleeding that is heavier than normal. ?  ? Bloody or brown urine. ?  ? Easy bruising. ? Black and tarry stool or bright red stool. ?  ? Side effects from your medicine. ?   You feel weak or dizzy.  Get help right away if:   You have bleeding that will not stop within 20 minutes from: ? The nose. ? The gums. ? A cut on the skin.  You have a severe headache or stomachache.  You vomit or cough up blood.  You fall or hit your head.   Summary  Anticoagulant therapy, also called blood thinner therapy, is medicine that helps to prevent and treat blood clots.  Anticoagulants work in different ways to prevent blood clots. They also have different risks and side effects.  Talk with your health care provider about any precautions that you should take while on anticoagulant therapy.   +++++++++++++++++++++++++++++++++++++++++++++++++++++++ We Do NOT Approve of  [facility], [employer]  Our Patients  To Do Home Visits & We Do NOT Approve of [employer] > > > > > > > > > > > > > > > > > > > > > > > > > > > > > > > > > > > > > > >  Preventive Care for Adults  A healthy lifestyle and preventive care can promote health and wellness. Preventive health guidelines for women include the following key practices.  A routine yearly physical is a good way to check with your health care provider about your health and preventive screening. It is a chance to share any concerns and updates on your health and to receive a  thorough exam.  Visit your dentist for a routine exam and preventive care every 6 months. Brush your teeth twice a day and floss once a day. Good oral hygiene prevents tooth decay and gum disease.  The frequency of eye exams is based on your age, health, family medical history, use of contact lenses, and other factors. Follow your health care provider's recommendations for frequency of eye exams.  Eat a healthy diet. Foods like vegetables, fruits, whole grains, low-fat dairy products, and lean protein foods contain the nutrients you need without too many calories. Decrease your intake of foods high in solid fats, added sugars, and salt. Eat the right amount of calories for you. Get information about a proper diet from your health care provider, if necessary.  Regular physical exercise is one of the most important things you can do for  your health. Most adults should get at least 150 minutes of moderate-intensity exercise (any activity that increases your heart rate and causes you to sweat) each week. In addition, most adults need muscle-strengthening exercises on 2 or more days a week.  Maintain a healthy weight. The body mass index (BMI) is a screening tool to identify possible weight problems. It provides an estimate of body fat based on height and weight. Your health care provider can find your BMI and can help you achieve or maintain a healthy weight. For adults 20 years and older:  A BMI below 18.5 is considered underweight.  A BMI of 18.5 to 24.9 is normal.  A BMI of 25 to 29.9 is considered overweight.  A BMI of 30 and above is considered obese.  Maintain normal blood lipids and cholesterol levels by exercising and minimizing your intake of saturated fat. Eat a balanced diet with plenty of fruit and vegetables. If your lipid or cholesterol levels are high, you are over 50, or you are at high risk for heart disease, you may need your cholesterol levels checked more frequently. Ongoing high  lipid and cholesterol levels should be treated with medicines if diet and exercise are not working.  If you smoke, find out from your health care provider how to quit. If you do not use tobacco, do not start.  Lung cancer screening is recommended for adults aged 20-80 years who are at high risk for developing lung cancer because of a history of smoking. A yearly low-dose CT scan of the lungs is recommended for people who have at least a 30-pack-year history of smoking and are a current smoker or have quit within the past 15 years. A pack year of smoking is smoking an average of 1 pack of cigarettes a day for 1 year (for example: 1 pack a day for 30 years or 2 packs a day for 15 years). Yearly screening should continue until the smoker has stopped smoking for at least 15 years. Yearly screening should be stopped for people who develop a health problem that would prevent them from having lung cancer treatment.  Avoid use of street drugs. Do not share needles with anyone. Ask for help if you need support or instructions about stopping the use of drugs.  High blood pressure causes heart disease and increases the risk of stroke.  Ongoing high blood pressure should be treated with medicines if weight loss and exercise do not work.  If you are 67-7 years old, ask your health care provider if you should take aspirin to prevent strokes.  Diabetes screening involves taking a blood sample to check your fasting blood sugar level. This should be done once every 3 years, after age 36, if you are within normal weight and without risk factors for diabetes. Testing should be considered at a younger age or be carried out more frequently if you are overweight and have at least 1 risk factor for diabetes.  Breast cancer screening is essential preventive care for women. You should practice "breast self-awareness." This means understanding the normal appearance and feel of your breasts and may include breast  self-examination. Any changes detected, no matter how small, should be reported to a health care provider. Women in their 23s and 30s should have a clinical breast exam (CBE) by a health care provider as part of a regular health exam every 1 to 3 years. After age 62, women should have a CBE every year. Starting at age 78, women should  consider having a mammogram (breast X-ray test) every year. Women who have a family history of breast cancer should talk to their health care provider about genetic screening. Women at a high risk of breast cancer should talk to their health care providers about having an MRI and a mammogram every year.  Breast cancer gene (BRCA)-related cancer risk assessment is recommended for women who have family members with BRCA-related cancers. BRCA-related cancers include breast, ovarian, tubal, and peritoneal cancers. Having family members with these cancers may be associated with an increased risk for harmful changes (mutations) in the breast cancer genes BRCA1 and BRCA2. Results of the assessment will determine the need for genetic counseling and BRCA1 and BRCA2 testing.  Routine pelvic exams to screen for cancer are no longer recommended for nonpregnant women who are considered low risk for cancer of the pelvic organs (ovaries, uterus, and vagina) and who do not have symptoms. Ask your health care provider if a screening pelvic exam is right for you.  If you have had past treatment for cervical cancer or a condition that could lead to cancer, you need Pap tests and screening for cancer for at least 20 years after your treatment. If Pap tests have been discontinued, your risk factors (such as having a new sexual partner) need to be reassessed to determine if screening should be resumed. Some women have medical problems that increase the chance of getting cervical cancer. In these cases, your health care provider may recommend more frequent screening and Pap tests.    Colorectal  cancer can be detected and often prevented. Most routine colorectal cancer screening begins at the age of 45 years and continues through age 26 years. However, your health care provider may recommend screening at an earlier age if you have risk factors for colon cancer. On a yearly basis, your health care provider may provide home test kits to check for hidden blood in the stool. Use of a small camera at the end of a tube, to directly examine the colon (sigmoidoscopy or colonoscopy), can detect the earliest forms of colorectal cancer. Talk to your health care provider about this at age 12, when routine screening begins.  Direct exam of the colon should be repeated every 5-10 years through age 81 years, unless early forms of pre-cancerous polyps or small growths are found.  Osteoporosis is a disease in which the bones lose minerals and strength with aging. This can result in serious bone fractures or breaks. The risk of osteoporosis can be identified using a bone density scan. Women ages 70 years and over and women at risk for fractures or osteoporosis should discuss screening with their health care providers. Ask your health care provider whether you should take a calcium supplement or vitamin D to reduce the rate of osteoporosis.  Menopause can be associated with physical symptoms and risks. Hormone replacement therapy is available to decrease symptoms and risks. You should talk to your health care provider about whether hormone replacement therapy is right for you.  Use sunscreen. Apply sunscreen liberally and repeatedly throughout the day. You should seek shade when your shadow is shorter than you. Protect yourself by wearing long sleeves, pants, a wide-brimmed hat, and sunglasses year round, whenever you are outdoors.  Once a month, do a whole body skin exam, using a mirror to look at the skin on your back. Tell your health care provider of new moles, moles that have irregular borders, moles that are  larger than a pencil eraser, or  moles that have changed in shape or color.  Stay current with required vaccines (immunizations).  Influenza vaccine. All adults should be immunized every year.  Tetanus, diphtheria, and acellular pertussis (Td, Tdap) vaccine. Pregnant women should receive 1 dose of Tdap vaccine during each pregnancy. The dose should be obtained regardless of the length of time since the last dose. Immunization is preferred during the 27th-36th week of gestation. An adult who has not previously received Tdap or who does not know her vaccine status should receive 1 dose of Tdap. This initial dose should be followed by tetanus and diphtheria toxoids (Td) booster doses every 10 years. Adults with an unknown or incomplete history of completing a 3-dose immunization series with Td-containing vaccines should begin or complete a primary immunization series including a Tdap dose. Adults should receive a Td booster every 10 years.    Zoster vaccine. One dose is recommended for adults aged 99 years or older unless certain conditions are present.    Pneumococcal 13-valent conjugate (PCV13) vaccine. When indicated, a person who is uncertain of her immunization history and has no record of immunization should receive the PCV13 vaccine. An adult aged 12 years or older who has certain medical conditions and has not been previously immunized should receive 1 dose of PCV13 vaccine. This PCV13 should be followed with a dose of pneumococcal polysaccharide (PPSV23) vaccine. The PPSV23 vaccine dose should be obtained at least 1 or more year(s) after the dose of PCV13 vaccine. An adult aged 31 years or older who has certain medical conditions and previously received 1 or more doses of PPSV23 vaccine should receive 1 dose of PCV13. The PCV13 vaccine dose should be obtained 1 or more years after the last PPSV23 vaccine dose.    Pneumococcal polysaccharide (PPSV23) vaccine. When PCV13 is also indicated, PCV13  should be obtained first. All adults aged 52 years and older should be immunized. An adult younger than age 100 years who has certain medical conditions should be immunized. Any person who resides in a nursing home or long-term care facility should be immunized. An adult smoker should be immunized. People with an immunocompromised condition and certain other conditions should receive both PCV13 and PPSV23 vaccines. People with human immunodeficiency virus (HIV) infection should be immunized as soon as possible after diagnosis. Immunization during chemotherapy or radiation therapy should be avoided. Routine use of PPSV23 vaccine is not recommended for American Indians, [place] Natives, or people younger than 65 years unless there are medical conditions that require PPSV23 vaccine. When indicated, people who have unknown immunization and have no record of immunization should receive PPSV23 vaccine. One-time revaccination 5 years after the first dose of PPSV23 is recommended for people aged 19-64 years who have chronic kidney failure, nephrotic syndrome, asplenia, or immunocompromised conditions. People who received 1-2 doses of PPSV23 before age 37 years should receive another dose of PPSV23 vaccine at age 31 years or later if at least 5 years have passed since the previous dose. Doses of PPSV23 are not needed for people immunized with PPSV23 at or after age 42 years.   Preventive Services / Frequency  Ages 20 years and over  Blood pressure check.  Lipid and cholesterol check.  Lung cancer screening. / Every year if you are aged 36-80 years and have a 30-pack-year history of smoking and currently smoke or have quit within the past 15 years. Yearly screening is stopped once you have quit smoking for at least 15 years or develop a health problem  that would prevent you from having lung cancer treatment.  Clinical breast exam.** / Every year after age 30 years.   BRCA-related cancer risk assessment.** / For  women who have family members with a BRCA-related cancer (breast, ovarian, tubal, or peritoneal cancers).  Mammogram.** / Every year beginning at age 74 years and continuing for as long as you are in good health. Consult with your health care provider.  Pap test.** / Every 3 years starting at age 27 years through age 62 or 73 years with 3 consecutive normal Pap tests. Testing can be stopped between 65 and 70 years with 3 consecutive normal Pap tests and no abnormal Pap or HPV tests in the past 10 years.  Fecal occult blood test (FOBT) of stool. / Every year beginning at age 1 years and continuing until age 25 years. You may not need to do this test if you get a colonoscopy every 10 years.  Flexible sigmoidoscopy or colonoscopy.** / Every 5 years for a flexible sigmoidoscopy or every 10 years for a colonoscopy beginning at age 36 years and continuing until age 67 years.  Hepatitis C blood test.** / For all people born from [birth year] through 1963 and any individual with known risks for hepatitis C.  Osteoporosis screening.** / A one-time screening for women ages 19 years and over and women at risk for fractures or osteoporosis.  Skin self-exam. / Monthly.  Influenza vaccine. / Every year.  Tetanus, diphtheria, and acellular pertussis (Tdap/Td) vaccine.** / 1 dose of Td every 10 years.  Zoster vaccine.** / 1 dose for adults aged 40 years or older.  Pneumococcal 13-valent conjugate (PCV13) vaccine.** / Consult your health care provider.  Pneumococcal polysaccharide (PPSV23) vaccine.** / 1 dose for all adults aged 65 years and older. Screening for abdominal aortic aneurysm (AAA)  by ultrasound is recommended for people who have history of high blood pressure or who are current or former smokers. ++++++++++++++++++++ Recommend Adult Low Dose Aspirin or  coated  Aspirin 81 mg daily  To reduce risk of Colon Cancer 20 %,  Skin Cancer 26 % ,  Melanoma 46%  and  Pancreatic cancer  60% ++++++++++++++++++++ Vitamin D goal  is between 70-100.  Please make sure that you are taking your Vitamin D as directed.  It is very important as a natural anti-inflammatory  helping hair, skin, and nails, as well as reducing stroke and heart attack risk.  It helps your bones and helps with mood. It also decreases numerous cancer risks so please take it as directed.  Low Vit D is associated with a 200-300% higher risk for CANCER  and 200-300% higher risk for HEART   ATTACK  &  STROKE.   .....................................Marland Kitchen It is also associated with higher death rate at younger ages,  autoimmune diseases like Rheumatoid arthritis, Lupus, Multiple Sclerosis.    Also many other serious conditions, like depression, Alzheimer's Dementia, infertility, muscle aches, fatigue, fibromyalgia - just to name a few. ++++++++++++++++++ Recommend the book "The END of DIETING" by Dr Excell Seltzer  & the book "The END of DIABETES " by Dr Excell Seltzer At [email or web address] - get book & Audio CD's    Being diabetic has a  300% increased risk for heart attack, stroke, cancer, and alzheimer- type vascular dementia. It is very important that you work harder with diet by avoiding all foods that are white. Avoid white rice (brown & wild rice is OK), white potatoes (sweetpotatoes in moderation is OK), White bread or wheat  bread or anything made out of white flour like bagels, donuts, rolls, buns, biscuits, cakes, pastries, cookies, pizza crust, and pasta (made from white flour & egg whites) - vegetarian pasta or spinach or wheat pasta is OK. Multigrain breads like Arnold's or [employer], or multigrain sandwich thins or flatbreads.  Diet, exercise and weight loss can reverse and cure diabetes in the early stages.  Diet, exercise and weight loss is very important in the control and prevention of complications of diabetes which affects every system in your body, ie. Brain - dementia/stroke, eyes - glaucoma/blindness,  heart - heart attack/heart failure, kidneys - dialysis, stomach - gastric paralysis, intestines - malabsorption, nerves - severe painful neuritis, circulation - gangrene & loss of a leg(s), and finally cancer and Alzheimers.    I recommend avoid fried & greasy foods,  sweets/candy, white rice (brown or wild rice or Quinoa is OK), white potatoes (sweet potatoes are OK) - anything made from white flour - bagels, doughnuts, rolls, buns, biscuits,white and wheat breads, pizza crust and traditional pasta made of white flour & egg white(vegetarian pasta or spinach or wheat pasta is OK).  Multi-grain bread is OK - like multi-grain flat bread or sandwich thins. Avoid alcohol in excess. Exercise is also important.    Eat all the vegetables you want - avoid meat, especially red meat and dairy - especially cheese.  Cheese is the most concentrated form of trans-fats which is the worst thing to clog up our arteries. Veggie cheese is OK which can be found in the fresh produce section at [employer] or [employer] or [employer]  +++++++++++++++++++ DASH Eating Plan  DASH stands for "Dietary Approaches to Stop Hypertension."   The DASH eating plan is a healthy eating plan that has been shown to reduce high blood pressure (hypertension). Additional health benefits may include reducing the risk of type 2 diabetes mellitus, heart disease, and stroke. The DASH eating plan may also help with weight loss. WHAT DO I NEED TO KNOW ABOUT THE DASH EATING PLAN? For the DASH eating plan, you will follow these general guidelines:  Choose foods with a percent daily value for sodium of less than 5% (as listed on the food label).  Use salt-free seasonings or herbs instead of table salt or sea salt.  Check with your health care provider or pharmacist before using salt substitutes.  Eat lower-sodium products, often labeled as "lower sodium" or "no salt added."  Eat fresh foods.  Eat more vegetables, fruits, and low-fat dairy  products.  Choose whole grains. Look for the word "whole" as the first word in the ingredient list.  Choose fish   Limit sweets, desserts, sugars, and sugary drinks.  Choose heart-healthy fats.  Eat veggie cheese   Eat more home-cooked food and less restaurant, buffet, and fast food.  Limit fried foods.  Cook foods using methods other than frying.  Limit canned vegetables. If you do use them, rinse them well to decrease the sodium.  When eating at a restaurant, ask that your food be prepared with less salt, or no salt if possible.                      WHAT FOODS CAN I EAT? Read Dr Fara Olden Fuhrman's books on The End of Dieting & The End of Diabetes  Grains Whole grain or whole wheat bread. Brown rice. Whole grain or whole wheat pasta. Quinoa, bulgur, and whole grain cereals. Low-sodium cereals. Corn or whole wheat flour  tortillas. Whole grain cornbread. Whole grain crackers. Low-sodium crackers.  Vegetables Fresh or frozen vegetables (raw, steamed, roasted, or grilled). Low-sodium or reduced-sodium tomato and vegetable juices. Low-sodium or reduced-sodium tomato sauce and paste. Low-sodium or reduced-sodium canned vegetables.   Fruits All fresh, canned (in natural juice), or frozen fruits.  Protein Products  All fish and seafood.  Dried beans, peas, or lentils. Unsalted nuts and seeds. Unsalted canned beans.  Dairy Low-fat dairy products, such as skim or 1% milk, 2% or reduced-fat cheeses, low-fat ricotta or cottage cheese, or plain low-fat yogurt. Low-sodium or reduced-sodium cheeses.  Fats and Oils Tub margarines without trans fats. Light or reduced-fat mayonnaise and salad dressings (reduced sodium). Avocado. Safflower, olive, or canola oils. Natural peanut or almond butter.  Other Unsalted popcorn and pretzels. The items listed above may not be a complete list of recommended foods or beverages. Contact your dietitian for more options.  +++++++++++++++  WHAT FOODS ARE  NOT RECOMMENDED? Grains/ White flour or wheat flour White bread. White pasta. White rice. Refined cornbread. Bagels and croissants. Crackers that contain trans fat.  Vegetables  Creamed or fried vegetables. Vegetables in a . Regular canned vegetables. Regular canned tomato sauce and paste. Regular tomato and vegetable juices.  Fruits Dried fruits. Canned fruit in light or heavy syrup. Fruit juice.  Meat and Other Protein Products Meat in general - RED meat & White meat.  Fatty cuts of meat. Ribs, chicken wings, all processed meats as bacon, sausage, bologna, salami, fatback, hot dogs, bratwurst and packaged luncheon meats.  Dairy Whole or 2% milk, cream, half-and-half, and cream cheese. Whole-fat or sweetened yogurt. Full-fat cheeses or blue cheese. Non-dairy creamers and whipped toppings. Processed cheese, cheese spreads, or cheese curds.  Condiments Onion and garlic salt, seasoned salt, table salt, and sea salt. Canned and packaged gravies. Worcestershire sauce. Tartar sauce. Barbecue sauce. Teriyaki sauce. Soy sauce, including reduced sodium. Steak sauce. Fish sauce. Oyster sauce. Cocktail sauce. Horseradish. Ketchup and mustard. Meat flavorings and tenderizers. Bouillon cubes. Hot sauce. Tabasco sauce. Marinades. Taco seasonings. Relishes.  Fats and Oils Butter, stick margarine, lard, shortening and bacon fat. Coconut, palm kernel, or palm oils. Regular salad dressings.  Pickles and olives. Salted popcorn and pretzels.  The items listed above may not be a complete list of foods and beverages to avoid.

## 2018-05-27 ENCOUNTER — Ambulatory Visit: Payer: Medicare Other | Admitting: Internal Medicine

## 2018-05-27 ENCOUNTER — Telehealth (INDEPENDENT_AMBULATORY_CARE_PROVIDER_SITE_OTHER): Payer: Medicare Other | Admitting: Pharmacist

## 2018-05-27 VITALS — BP 120/82 | HR 76 | Temp 97.2°F | Resp 16 | Ht 62.5 in | Wt 115.6 lb

## 2018-05-27 DIAGNOSIS — R2689 Other abnormalities of gait and mobility: Secondary | ICD-10-CM

## 2018-05-27 DIAGNOSIS — Z8249 Family history of ischemic heart disease and other diseases of the circulatory system: Secondary | ICD-10-CM

## 2018-05-27 DIAGNOSIS — I4891 Unspecified atrial fibrillation: Secondary | ICD-10-CM | POA: Diagnosis not present

## 2018-05-27 DIAGNOSIS — Z1212 Encounter for screening for malignant neoplasm of rectum: Secondary | ICD-10-CM

## 2018-05-27 DIAGNOSIS — E559 Vitamin D deficiency, unspecified: Secondary | ICD-10-CM

## 2018-05-27 DIAGNOSIS — F0390 Unspecified dementia without behavioral disturbance: Secondary | ICD-10-CM

## 2018-05-27 DIAGNOSIS — Z1211 Encounter for screening for malignant neoplasm of colon: Secondary | ICD-10-CM

## 2018-05-27 DIAGNOSIS — I5030 Unspecified diastolic (congestive) heart failure: Secondary | ICD-10-CM

## 2018-05-27 DIAGNOSIS — I482 Chronic atrial fibrillation, unspecified: Secondary | ICD-10-CM

## 2018-05-27 DIAGNOSIS — Z79899 Other long term (current) drug therapy: Secondary | ICD-10-CM

## 2018-05-27 DIAGNOSIS — Z8673 Personal history of transient ischemic attack (TIA), and cerebral infarction without residual deficits: Secondary | ICD-10-CM

## 2018-05-27 DIAGNOSIS — K219 Gastro-esophageal reflux disease without esophagitis: Secondary | ICD-10-CM

## 2018-05-27 DIAGNOSIS — Z0000 Encounter for general adult medical examination without abnormal findings: Secondary | ICD-10-CM | POA: Diagnosis not present

## 2018-05-27 DIAGNOSIS — Z0001 Encounter for general adult medical examination with abnormal findings: Secondary | ICD-10-CM

## 2018-05-27 DIAGNOSIS — Z136 Encounter for screening for cardiovascular disorders: Secondary | ICD-10-CM

## 2018-05-27 DIAGNOSIS — I700 Atherosclerosis of aorta: Secondary | ICD-10-CM

## 2018-05-27 DIAGNOSIS — E782 Mixed hyperlipidemia: Secondary | ICD-10-CM | POA: Diagnosis not present

## 2018-05-27 DIAGNOSIS — I10 Essential (primary) hypertension: Secondary | ICD-10-CM

## 2018-05-27 DIAGNOSIS — K582 Mixed irritable bowel syndrome: Secondary | ICD-10-CM

## 2018-05-27 DIAGNOSIS — E1122 Type 2 diabetes mellitus with diabetic chronic kidney disease: Secondary | ICD-10-CM | POA: Diagnosis not present

## 2018-05-27 DIAGNOSIS — N182 Chronic kidney disease, stage 2 (mild): Secondary | ICD-10-CM

## 2018-05-27 NOTE — Telephone Encounter (Signed)
Home health stopped. Needs appointment for INR check

## 2018-05-27 NOTE — Addendum Note (Signed)
Addended by: Malena PeerMACCIA, Maevis Mumby D on: 05/27/2018 09:10 AM   Modules accepted: Orders

## 2018-05-28 LAB — MICROALBUMIN / CREATININE URINE RATIO
Creatinine, Urine: 45 mg/dL (ref 20–275)
Microalb Creat Ratio: 9 mcg/mg creat (ref <30)
Microalb, Ur: 0.4 mg/dL

## 2018-05-28 LAB — URINALYSIS, ROUTINE W REFLEX MICROSCOPIC
Bilirubin Urine: NEGATIVE
Glucose, UA: NEGATIVE
HGB URINE DIPSTICK: NEGATIVE
Ketones, ur: NEGATIVE
LEUKOCYTES UA: NEGATIVE
NITRITE: NEGATIVE
PROTEIN: NEGATIVE
Specific Gravity, Urine: 1.01 (ref 1.001–1.03)
pH: <=5 (ref 5.0–8.0)

## 2018-05-28 LAB — COMPLETE METABOLIC PANEL WITH GFR
AG RATIO: 1.5 (calc) (ref 1.0–2.5)
ALKALINE PHOSPHATASE (APISO): 144 U/L — AB (ref 33–130)
ALT: 14 U/L (ref 6–29)
AST: 20 U/L (ref 10–35)
Albumin: 4.3 g/dL (ref 3.6–5.1)
BUN: 13 mg/dL (ref 7–25)
CALCIUM: 9.8 mg/dL (ref 8.6–10.4)
CO2: 29 mmol/L (ref 20–32)
Chloride: 104 mmol/L (ref 98–110)
Creat: 0.85 mg/dL (ref 0.60–0.88)
GFR, Est African American: 69 mL/min/{1.73_m2} (ref >=60)
GFR, Est Non African American: 60 mL/min/{1.73_m2} (ref >=60)
GLOBULIN: 2.8 g/dL (ref 1.9–3.7)
Glucose, Bld: 99 mg/dL (ref 65–99)
POTASSIUM: 5 mmol/L (ref 3.5–5.3)
SODIUM: 140 mmol/L (ref 135–146)
Total Bilirubin: 0.4 mg/dL (ref 0.2–1.2)
Total Protein: 7.1 g/dL (ref 6.1–8.1)

## 2018-05-28 LAB — HEMOGLOBIN A1C
Hgb A1c MFr Bld: 6 % of total Hgb — ABNORMAL HIGH (ref <5.7)
Mean Plasma Glucose: 126 (calc)
eAG (mmol/L): 7 (calc)

## 2018-05-28 LAB — VITAMIN D 25 HYDROXY (VIT D DEFICIENCY, FRACTURES): Vit D, 25-Hydroxy: 45 ng/mL (ref 30–100)

## 2018-05-28 LAB — CBC WITH DIFFERENTIAL/PLATELET
Absolute Monocytes: 441 cells/uL (ref 200–950)
BASOS ABS: 49 {cells}/uL (ref 0–200)
Basophils Relative: 0.7 %
EOS PCT: 1.3 %
Eosinophils Absolute: 91 cells/uL (ref 15–500)
HEMATOCRIT: 43.3 % (ref 35.0–45.0)
Hemoglobin: 14.2 g/dL (ref 11.7–15.5)
LYMPHS ABS: 2856 {cells}/uL (ref 850–3900)
MCH: 29.6 pg (ref 27.0–33.0)
MCHC: 32.8 g/dL (ref 32.0–36.0)
MCV: 90.4 fL (ref 80.0–100.0)
MPV: 10.4 fL (ref 7.5–12.5)
Monocytes Relative: 6.3 %
NEUTROS PCT: 50.9 %
Neutro Abs: 3563 cells/uL (ref 1500–7800)
PLATELETS: 218 10*3/uL (ref 140–400)
RBC: 4.79 10*6/uL (ref 3.80–5.10)
RDW: 13.2 % (ref 11.0–15.0)
TOTAL LYMPHOCYTE: 40.8 %
WBC: 7 10*3/uL (ref 3.8–10.8)

## 2018-05-28 LAB — INSULIN, RANDOM: Insulin: 2.8 u[IU]/mL (ref 2.0–19.6)

## 2018-05-28 LAB — TSH: TSH: 0.67 mIU/L (ref 0.40–4.50)

## 2018-05-28 LAB — MAGNESIUM: Magnesium: 2 mg/dL (ref 1.5–2.5)

## 2018-05-30 ENCOUNTER — Other Ambulatory Visit: Payer: Self-pay | Admitting: Cardiology

## 2018-05-30 DIAGNOSIS — Z8673 Personal history of transient ischemic attack (TIA), and cerebral infarction without residual deficits: Secondary | ICD-10-CM | POA: Diagnosis not present

## 2018-05-30 DIAGNOSIS — E1122 Type 2 diabetes mellitus with diabetic chronic kidney disease: Secondary | ICD-10-CM | POA: Diagnosis not present

## 2018-05-30 DIAGNOSIS — N182 Chronic kidney disease, stage 2 (mild): Secondary | ICD-10-CM | POA: Diagnosis not present

## 2018-05-30 DIAGNOSIS — I5030 Unspecified diastolic (congestive) heart failure: Secondary | ICD-10-CM | POA: Diagnosis not present

## 2018-05-30 DIAGNOSIS — Z9181 History of falling: Secondary | ICD-10-CM | POA: Diagnosis not present

## 2018-05-30 DIAGNOSIS — Z5181 Encounter for therapeutic drug level monitoring: Secondary | ICD-10-CM | POA: Diagnosis not present

## 2018-05-30 DIAGNOSIS — K449 Diaphragmatic hernia without obstruction or gangrene: Secondary | ICD-10-CM | POA: Diagnosis not present

## 2018-05-30 DIAGNOSIS — I82401 Acute embolism and thrombosis of unspecified deep veins of right lower extremity: Secondary | ICD-10-CM | POA: Diagnosis not present

## 2018-05-30 DIAGNOSIS — Z7901 Long term (current) use of anticoagulants: Secondary | ICD-10-CM | POA: Diagnosis not present

## 2018-05-30 DIAGNOSIS — I4891 Unspecified atrial fibrillation: Secondary | ICD-10-CM | POA: Diagnosis not present

## 2018-05-30 DIAGNOSIS — I110 Hypertensive heart disease with heart failure: Secondary | ICD-10-CM | POA: Diagnosis not present

## 2018-05-31 ENCOUNTER — Ambulatory Visit (INDEPENDENT_AMBULATORY_CARE_PROVIDER_SITE_OTHER): Payer: Medicare Other | Admitting: Interventional Cardiology

## 2018-05-31 DIAGNOSIS — Z0001 Encounter for general adult medical examination with abnormal findings: Secondary | ICD-10-CM

## 2018-05-31 DIAGNOSIS — Z5181 Encounter for therapeutic drug level monitoring: Secondary | ICD-10-CM

## 2018-05-31 DIAGNOSIS — Z8673 Personal history of transient ischemic attack (TIA), and cerebral infarction without residual deficits: Secondary | ICD-10-CM

## 2018-05-31 DIAGNOSIS — I4891 Unspecified atrial fibrillation: Secondary | ICD-10-CM

## 2018-05-31 LAB — POCT INR: INR: 1.9 — AB (ref 2.0–3.0)

## 2018-05-31 NOTE — Progress Notes (Signed)
RN called stating that she was given orders to check inr today even though pt scheduled to come into the office at 1015am

## 2018-06-06 ENCOUNTER — Telehealth: Payer: Self-pay | Admitting: *Deleted

## 2018-06-06 NOTE — Telephone Encounter (Signed)
The patient called and reported she has been having diarrhea since 06/02/2018, but has not told her son or the assisted living staff.  Per Dr Oneta Rack, she should take the Dicyclomine 20 mg three times a day, the Prevalite as ordered and can add OTC Imodium as needed. The patient's son is aware of the directions.

## 2018-06-08 LAB — IFOBT (OCCULT BLOOD): IFOBT: NEGATIVE

## 2018-06-10 ENCOUNTER — Ambulatory Visit (INDEPENDENT_AMBULATORY_CARE_PROVIDER_SITE_OTHER): Payer: Medicare Other

## 2018-06-10 DIAGNOSIS — Z8673 Personal history of transient ischemic attack (TIA), and cerebral infarction without residual deficits: Secondary | ICD-10-CM

## 2018-06-10 DIAGNOSIS — Z7901 Long term (current) use of anticoagulants: Secondary | ICD-10-CM

## 2018-06-10 DIAGNOSIS — I4891 Unspecified atrial fibrillation: Secondary | ICD-10-CM

## 2018-06-10 DIAGNOSIS — Z0001 Encounter for general adult medical examination with abnormal findings: Secondary | ICD-10-CM

## 2018-06-10 LAB — POCT INR: INR: 1.8 — AB (ref 2.0–3.0)

## 2018-06-10 NOTE — Patient Instructions (Signed)
Description   Spoke with Deirdre Peer RN and advised pt to take start taking 2mg  daily except 3mg  on Mondays and Fridays. Recheck INR in 10 days.  Call us at [facility] # [phone], Main # [phone].

## 2018-06-16 ENCOUNTER — Emergency Department (HOSPITAL_COMMUNITY): Payer: Medicare Other

## 2018-06-16 ENCOUNTER — Inpatient Hospital Stay (HOSPITAL_COMMUNITY): Payer: Medicare Other

## 2018-06-16 ENCOUNTER — Encounter (HOSPITAL_COMMUNITY): Payer: Self-pay

## 2018-06-16 ENCOUNTER — Other Ambulatory Visit: Payer: Self-pay

## 2018-06-16 ENCOUNTER — Inpatient Hospital Stay (HOSPITAL_COMMUNITY)
Admission: EM | Admit: 2018-06-16 | Discharge: 2018-06-20 | DRG: 391 | Disposition: A | Discharge status: Home Health | Payer: Medicare Other | Source: Skilled Nursing Facility | Attending: Internal Medicine | Admitting: Internal Medicine

## 2018-06-16 DIAGNOSIS — Z9842 Cataract extraction status, left eye: Secondary | ICD-10-CM | POA: Diagnosis not present

## 2018-06-16 DIAGNOSIS — Z9841 Cataract extraction status, right eye: Secondary | ICD-10-CM

## 2018-06-16 DIAGNOSIS — Z8249 Family history of ischemic heart disease and other diseases of the circulatory system: Secondary | ICD-10-CM

## 2018-06-16 DIAGNOSIS — Z4682 Encounter for fitting and adjustment of non-vascular catheter: Secondary | ICD-10-CM | POA: Diagnosis not present

## 2018-06-16 DIAGNOSIS — K5730 Diverticulosis of large intestine without perforation or abscess without bleeding: Secondary | ICD-10-CM | POA: Diagnosis not present

## 2018-06-16 DIAGNOSIS — F0390 Unspecified dementia without behavioral disturbance: Secondary | ICD-10-CM | POA: Diagnosis present

## 2018-06-16 DIAGNOSIS — N182 Chronic kidney disease, stage 2 (mild): Secondary | ICD-10-CM | POA: Diagnosis present

## 2018-06-16 DIAGNOSIS — E1122 Type 2 diabetes mellitus with diabetic chronic kidney disease: Secondary | ICD-10-CM | POA: Diagnosis present

## 2018-06-16 DIAGNOSIS — Z79899 Other long term (current) drug therapy: Secondary | ICD-10-CM

## 2018-06-16 DIAGNOSIS — Z823 Family history of stroke: Secondary | ICD-10-CM | POA: Diagnosis not present

## 2018-06-16 DIAGNOSIS — E559 Vitamin D deficiency, unspecified: Secondary | ICD-10-CM | POA: Diagnosis present

## 2018-06-16 DIAGNOSIS — I5032 Chronic diastolic (congestive) heart failure: Secondary | ICD-10-CM | POA: Diagnosis not present

## 2018-06-16 DIAGNOSIS — Z888 Allergy status to other drugs, medicaments and biological substances status: Secondary | ICD-10-CM | POA: Diagnosis not present

## 2018-06-16 DIAGNOSIS — K219 Gastro-esophageal reflux disease without esophagitis: Secondary | ICD-10-CM | POA: Diagnosis present

## 2018-06-16 DIAGNOSIS — E785 Hyperlipidemia, unspecified: Secondary | ICD-10-CM | POA: Diagnosis present

## 2018-06-16 DIAGNOSIS — Z431 Encounter for attention to gastrostomy: Secondary | ICD-10-CM | POA: Diagnosis not present

## 2018-06-16 DIAGNOSIS — I4891 Unspecified atrial fibrillation: Secondary | ICD-10-CM

## 2018-06-16 DIAGNOSIS — R1084 Generalized abdominal pain: Secondary | ICD-10-CM | POA: Diagnosis not present

## 2018-06-16 DIAGNOSIS — Z833 Family history of diabetes mellitus: Secondary | ICD-10-CM | POA: Diagnosis not present

## 2018-06-16 DIAGNOSIS — K589 Irritable bowel syndrome without diarrhea: Secondary | ICD-10-CM | POA: Diagnosis not present

## 2018-06-16 DIAGNOSIS — Z9049 Acquired absence of other specified parts of digestive tract: Secondary | ICD-10-CM

## 2018-06-16 DIAGNOSIS — K56609 Unspecified intestinal obstruction, unspecified as to partial versus complete obstruction: Secondary | ICD-10-CM | POA: Diagnosis not present

## 2018-06-16 DIAGNOSIS — K311 Adult hypertrophic pyloric stenosis: Secondary | ICD-10-CM | POA: Diagnosis not present

## 2018-06-16 DIAGNOSIS — I130 Hypertensive heart and chronic kidney disease with heart failure and stage 1 through stage 4 chronic kidney disease, or unspecified chronic kidney disease: Secondary | ICD-10-CM | POA: Diagnosis present

## 2018-06-16 DIAGNOSIS — Z6820 Body mass index (BMI) 20.0-20.9, adult: Secondary | ICD-10-CM

## 2018-06-16 DIAGNOSIS — K2970 Gastritis, unspecified, without bleeding: Secondary | ICD-10-CM | POA: Diagnosis not present

## 2018-06-16 DIAGNOSIS — F419 Anxiety disorder, unspecified: Secondary | ICD-10-CM | POA: Diagnosis not present

## 2018-06-16 DIAGNOSIS — Z8261 Family history of arthritis: Secondary | ICD-10-CM

## 2018-06-16 DIAGNOSIS — E43 Unspecified severe protein-calorie malnutrition: Secondary | ICD-10-CM | POA: Diagnosis not present

## 2018-06-16 DIAGNOSIS — Z4659 Encounter for fitting and adjustment of other gastrointestinal appliance and device: Secondary | ICD-10-CM

## 2018-06-16 DIAGNOSIS — R1013 Epigastric pain: Secondary | ICD-10-CM | POA: Diagnosis not present

## 2018-06-16 DIAGNOSIS — K449 Diaphragmatic hernia without obstruction or gangrene: Principal | ICD-10-CM | POA: Diagnosis present

## 2018-06-16 DIAGNOSIS — I5030 Unspecified diastolic (congestive) heart failure: Secondary | ICD-10-CM

## 2018-06-16 DIAGNOSIS — R0789 Other chest pain: Secondary | ICD-10-CM | POA: Diagnosis not present

## 2018-06-16 DIAGNOSIS — R112 Nausea with vomiting, unspecified: Secondary | ICD-10-CM | POA: Diagnosis not present

## 2018-06-16 DIAGNOSIS — I69320 Aphasia following cerebral infarction: Secondary | ICD-10-CM | POA: Diagnosis not present

## 2018-06-16 DIAGNOSIS — I10 Essential (primary) hypertension: Secondary | ICD-10-CM

## 2018-06-16 DIAGNOSIS — Z7901 Long term (current) use of anticoagulants: Secondary | ICD-10-CM

## 2018-06-16 DIAGNOSIS — Z8673 Personal history of transient ischemic attack (TIA), and cerebral infarction without residual deficits: Secondary | ICD-10-CM

## 2018-06-16 LAB — CBC
HCT: 45.3 % (ref 36.0–46.0)
Hemoglobin: 14.5 g/dL (ref 12.0–15.0)
MCH: 29.5 pg (ref 26.0–34.0)
MCHC: 32 g/dL (ref 30.0–36.0)
MCV: 92.3 fL (ref 80.0–100.0)
Platelets: 204 10*3/uL (ref 150–400)
RBC: 4.91 MIL/uL (ref 3.87–5.11)
RDW: 13.6 % (ref 11.5–15.5)
WBC: 9.7 10*3/uL (ref 4.0–10.5)
nRBC: 0 % (ref 0.0–0.2)

## 2018-06-16 LAB — CBC WITH DIFFERENTIAL/PLATELET
ABS IMMATURE GRANULOCYTES: 0.02 10*3/uL (ref 0.00–0.07)
Basophils Absolute: 0 10*3/uL (ref 0.0–0.1)
Basophils Relative: 0 %
Eosinophils Absolute: 0 10*3/uL (ref 0.0–0.5)
Eosinophils Relative: 0 %
HCT: 46 % (ref 36.0–46.0)
HEMOGLOBIN: 14.8 g/dL (ref 12.0–15.0)
Immature Granulocytes: 0 %
Lymphocytes Relative: 16 %
Lymphs Abs: 1.5 10*3/uL (ref 0.7–4.0)
MCH: 30 pg (ref 26.0–34.0)
MCHC: 32.2 g/dL (ref 30.0–36.0)
MCV: 93.1 fL (ref 80.0–100.0)
Monocytes Absolute: 0.3 10*3/uL (ref 0.1–1.0)
Monocytes Relative: 3 %
Neutro Abs: 7.6 10*3/uL (ref 1.7–7.7)
Neutrophils Relative %: 81 %
Platelets: 231 10*3/uL (ref 150–400)
RBC: 4.94 MIL/uL (ref 3.87–5.11)
RDW: 13.6 % (ref 11.5–15.5)
WBC: 9.5 10*3/uL (ref 4.0–10.5)
nRBC: 0 % (ref 0.0–0.2)

## 2018-06-16 LAB — COMPREHENSIVE METABOLIC PANEL
ALK PHOS: 122 U/L (ref 38–126)
ALT: 22 U/L (ref 0–44)
AST: 25 U/L (ref 15–41)
Albumin: 3.9 g/dL (ref 3.5–5.0)
Anion gap: 14 (ref 5–15)
BUN: 11 mg/dL (ref 8–23)
CO2: 25 mmol/L (ref 22–32)
Calcium: 9.2 mg/dL (ref 8.9–10.3)
Chloride: 103 mmol/L (ref 98–111)
Creatinine, Ser: 0.76 mg/dL (ref 0.44–1.00)
GFR calc Af Amer: >60 mL/min (ref >60)
GFR calc non Af Amer: >60 mL/min (ref >60)
Glucose, Bld: 161 mg/dL — ABNORMAL HIGH (ref 70–99)
Potassium: 3.8 mmol/L (ref 3.5–5.1)
SODIUM: 142 mmol/L (ref 135–145)
Total Bilirubin: 0.9 mg/dL (ref 0.3–1.2)
Total Protein: 7.2 g/dL (ref 6.5–8.1)

## 2018-06-16 LAB — URINALYSIS, ROUTINE W REFLEX MICROSCOPIC
Bilirubin Urine: NEGATIVE
GLUCOSE, UA: NEGATIVE mg/dL
KETONES UR: 5 mg/dL — AB
Leukocytes, UA: NEGATIVE
Nitrite: NEGATIVE
PH: 6 (ref 5.0–8.0)
Protein, ur: NEGATIVE mg/dL
Specific Gravity, Urine: 1.014 (ref 1.005–1.030)

## 2018-06-16 LAB — I-STAT CHEM 8, ED
BUN: 14 mg/dL (ref 8–23)
CHLORIDE: 102 mmol/L (ref 98–111)
Calcium, Ion: 1.11 mmol/L — ABNORMAL LOW (ref 1.15–1.40)
Creatinine, Ser: 0.7 mg/dL (ref 0.44–1.00)
Glucose, Bld: 157 mg/dL — ABNORMAL HIGH (ref 70–99)
HCT: 44 % (ref 36.0–46.0)
Hemoglobin: 15 g/dL (ref 12.0–15.0)
Potassium: 3.6 mmol/L (ref 3.5–5.1)
Sodium: 140 mmol/L (ref 135–145)
TCO2: 29 mmol/L (ref 22–32)

## 2018-06-16 LAB — BASIC METABOLIC PANEL
Anion gap: 11 (ref 5–15)
BUN: 12 mg/dL (ref 8–23)
CHLORIDE: 103 mmol/L (ref 98–111)
CO2: 27 mmol/L (ref 22–32)
Calcium: 8.9 mg/dL (ref 8.9–10.3)
Creatinine, Ser: 0.87 mg/dL (ref 0.44–1.00)
GFR calc Af Amer: >60 mL/min (ref >60)
GFR calc non Af Amer: 58 mL/min — ABNORMAL LOW (ref >60)
Glucose, Bld: 147 mg/dL — ABNORMAL HIGH (ref 70–99)
Potassium: 4 mmol/L (ref 3.5–5.1)
Sodium: 141 mmol/L (ref 135–145)

## 2018-06-16 LAB — PROTIME-INR
INR: 1.94
INR: 1.83
PROTHROMBIN TIME: 21 s — AB (ref 11.4–15.2)
Prothrombin Time: 21.9 seconds — ABNORMAL HIGH (ref 11.4–15.2)

## 2018-06-16 LAB — I-STAT TROPONIN, ED: Troponin i, poc: 0 ng/mL (ref 0.00–0.08)

## 2018-06-16 LAB — LIPASE, BLOOD: Lipase: 41 U/L (ref 11–51)

## 2018-06-16 MED ORDER — METOPROLOL TARTRATE 5 MG/5ML IV SOLN
2.5000 mg | Freq: Two times a day (BID) | INTRAVENOUS | Status: DC
  Administered 2018-06-16 – 2018-06-17 (×3): 2.5 mg via INTRAVENOUS
  Filled 2018-06-16 (×3): qty 5

## 2018-06-16 MED ORDER — ONDANSETRON HCL 4 MG/2ML IJ SOLN
4.0000 mg | Freq: Once | INTRAMUSCULAR | Status: AC
  Administered 2018-06-16: 4 mg via INTRAVENOUS
  Filled 2018-06-16: qty 2

## 2018-06-16 MED ORDER — SODIUM CHLORIDE 0.9 % IV BOLUS
500.0000 mL | Freq: Once | INTRAVENOUS | Status: AC
  Administered 2018-06-16: 500 mL via INTRAVENOUS

## 2018-06-16 MED ORDER — FAMOTIDINE IN NACL 20-0.9 MG/50ML-% IV SOLN
20.0000 mg | Freq: Once | INTRAVENOUS | Status: AC
  Administered 2018-06-16: 20 mg via INTRAVENOUS
  Filled 2018-06-16: qty 50

## 2018-06-16 MED ORDER — FAMOTIDINE IN NACL 20-0.9 MG/50ML-% IV SOLN: 20.0000 mg | Freq: Once | INTRAVENOUS | Status: DC

## 2018-06-16 MED ORDER — ONDANSETRON HCL 4 MG PO TABS: 4.0000 mg | ORAL_TABLET | Freq: Four times a day (QID) | ORAL | Status: DC | PRN

## 2018-06-16 MED ORDER — ONDANSETRON HCL 4 MG/2ML IJ SOLN
4.0000 mg | Freq: Four times a day (QID) | INTRAMUSCULAR | Status: DC | PRN
  Administered 2018-06-16 – 2018-06-17 (×4): 4 mg via INTRAVENOUS
  Filled 2018-06-16 (×4): qty 2

## 2018-06-16 MED ORDER — SODIUM CHLORIDE 0.9 % IV SOLN
INTRAVENOUS | Status: DC
  Administered 2018-06-16 – 2018-06-17 (×4): via INTRAVENOUS

## 2018-06-16 NOTE — ED Provider Notes (Signed)
[facility]   CSN: [id number] Arrival date & time: 06/16/18  0034     History   Chief Complaint Chief Complaint  Patient presents with  . Abdominal Pain    HPI Pati Lisa Hopkins is a 85 y.o. female.  The history is provided by a relative. The history is limited by the condition of the patient (level 5 dementia).  Abdominal Pain  Pain location:  Epigastric Pain quality: cramping   Pain radiates to:  Does not radiate Pain severity:  Moderate Onset quality:  Sudden Timing:  Constant Progression:  Unchanged Chronicity:  Chronic Context: not alcohol use and not eating   Relieved by:  Nothing Worsened by:  Nothing Ineffective treatments:  None tried Associated symptoms: diarrhea and vomiting   Associated symptoms: no chest pain, no constipation, no fever and no shortness of breath   Risk factors: recent hospitalization   Risk factors: no NSAID use   Multiple admissions for same.    Past Medical History:  Diagnosis Date  . Anxiety   . Aphasia as late effect of cerebrovascular accident   . Atrial fibrillation (HCC)   . CKD (chronic kidney disease), stage II   . CVA (cerebral infarction)   . Diabetes mellitus type II   . GERD (gastroesophageal reflux disease)   . Hiatal hernia   . HTN (hypertension)   . Hyperlipidemia   . IBS (irritable bowel syndrome)   . Stroke (HCC)   . Vitamin D deficiency     Patient Active Problem List   Diagnosis Date Noted  . DVT, lower extremity, distal, acute, right (HCC) 03/25/2018  . Syncope 03/21/2018  . Encounter for general adult medical examination with abnormal findings 12/21/2017  . Hiatal hernia 12/19/2017  . CKD stage 2 due to type 2 diabetes mellitus (HCC) 08/02/2017  . Atherosclerosis of aorta (HCC) 12/03/2015  . GERD 09/25/2014  . Diastolic CHF (HCC) 08/04/2014  . Hyperlipidemia, mixed   . Vitamin D deficiency   . Anxiety state 03/15/2010  . Essential hypertension  03/15/2010  . Atrial fibrillation (HCC) 03/15/2010  . APHASIA DUE TO CEREBROVASCULAR DISEASE 03/15/2010  . History of CVA (cerebrovascular accident) 03/07/2010    Past Surgical History:  Procedure Laterality Date  . CATARACT EXTRACTION, BILATERAL    . CHOLECYSTECTOMY    . COLONOSCOPY  2005  . ESOPHAGOGASTRODUODENOSCOPY (EGD) WITH PROPOFOL N/A 11/28/2017   Procedure: ESOPHAGOGASTRODUODENOSCOPY (EGD) WITH PROPOFOL;  Surgeon: Graylin ShiverGanem, Salem F, MD;  [facility];  Service: Endoscopy;  Laterality: N/A;  . TONSILLECTOMY    . UPPER GASTROINTESTINAL ENDOSCOPY  2005     OB History   No obstetric history on file.      Home Medications    Prior to Admission medications   Medication Sig Start Date End Date Taking? Authorizing Provider  Cholecalciferol (VITAMIN D) 2000 UNITS CAPS Take 2,000 Units by mouth daily.     [provider]  cholestyramine light (PREVALITE) 4 GM/DOSE powder Take 1 scoop ( 4 gm ) in a glass of Water or Juice 2 to 3 x  /day as needed for Diarrhea 04/30/18   Lucky CowboyMcKeown, William, MD  CINNAMON PO Take 1 capsule by mouth daily.    [provider]  dicyclomine (BENTYL) 20 MG tablet Take 1 tablet 3 x day if needed for nausea, bloating, cramping or diarrhea 05/16/18   Lucky CowboyMcKeown, William, MD  furosemide (LASIX) 20 MG tablet TAKE 1 TABLET EACH DAY. 04/29/18   Lucky CowboyMcKeown, William, MD  losartan (COZAAR) 50 MG tablet TAKE 1/2 TO 1 TABLET ONCE A DAY. 03/26/18   Lucky CowboyMcKeown, William, MD  Magnesium Hydroxide (MAGNESIA PO) Take 1 tablet by mouth daily.     [provider]  metoprolol tartrate (LOPRESSOR) 25 MG tablet Take 0.5 tablets (12.5 mg total) by mouth 2 (two) times daily. 07/16/17 03/16/26  Lucky CowboyMcKeown, William, MD  ondansetron (ZOFRAN ODT) 4 MG disintegrating tablet Dissolve 1 tablet under tongue every 6 to 8 hours if needed for nausea & / or vomitting 04/10/18   Lucky CowboyMcKeown, William, MD  pantoprazole (PROTONIX) 40 MG tablet Take 1 tablet daily for Heartburn & Acid  Indigestion 04/10/18   Lucky CowboyMcKeown, William, MD  potassium chloride (K-DUR) 10 MEQ tablet TAKE 1 TABLET BY MOUTH DAILY. 05/15/18   Lucky CowboyMcKeown, William, MD  QUEtiapine (SEROQUEL) 25 MG tablet Take 0.5 tablets (12.5 mg total) by mouth at bedtime. 03/23/18   Regalado, Belkys A, MD  sucralfate (CARAFATE) 1 g tablet Take 1 tablet dissolved in water 4 x /day before meals & bedtime 04/10/18   Lucky CowboyMcKeown, William, MD  warfarin (COUMADIN) 2 MG tablet TAKE AS DIRECTED BY ANTICOAGULATION CLINIC. 05/31/18   Lars MassonNelson, Katarina H, MD    Family History Family History  Problem Relation Age of Onset  . Diabetes Mother   . Stroke Mother   . Hypertension Brother   . Heart disease Brother   . Arthritis Sister        Rhematoid  . Arthritis Sister        Rhematoid    Social History Social History   Tobacco Use  . Smoking status: Never Smoker  . Smokeless tobacco: Never Used  Substance Use Topics  . Alcohol use: No  . Drug use: No     Allergies   Ace inhibitors; Fosamax [alendronate sodium]; Norvasc [amlodipine besylate]; and Zocor [simvastatin]   Review of Systems Review of Systems  Unable to perform ROS: Dementia  Constitutional: Negative for fever.  Respiratory: Negative for shortness of breath.   Cardiovascular: Negative for chest pain.  Gastrointestinal: Positive for abdominal pain, diarrhea and vomiting. Negative for constipation.     Physical Exam Updated Vital Signs BP (!) 170/101 (BP Location: Left Arm)   Pulse 82   Temp 98.1 F (36.7 C) (Oral)   Resp (!) 24   SpO2 94%   Physical Exam Vitals signs and nursing note reviewed.  Constitutional:      General: She is not in acute distress.    Appearance: She is well-developed.  HENT:     Head: Normocephalic and atraumatic.     Mouth/Throat:     Pharynx: Oropharynx is clear.  Eyes:     Pupils: Pupils are equal, round, and reactive to light.  Neck:     Musculoskeletal: Normal range of motion and neck supple.  Cardiovascular:     Rate  and Rhythm: Normal rate. Rhythm regularly irregular.     Pulses: Normal pulses.     Heart sounds: Normal heart sounds.  Pulmonary:     Effort: Pulmonary effort is normal.     Breath sounds: Normal breath sounds.  Abdominal:     General: Abdomen is flat. Bowel sounds are normal. There is no distension.     Tenderness: There is no abdominal tenderness. There is no guarding or rebound.  Musculoskeletal: Normal range of motion.  Skin:    General: Skin is warm and dry.     Capillary Refill: Capillary refill takes less than 2 seconds.  Neurological:  General: No focal deficit present.     Mental Status: She is alert.  Psychiatric:        Mood and Affect: Mood normal.        Behavior: Behavior normal.      ED Treatments / Results  Labs (all labs ordered are listed, but only abnormal results are displayed) Labs Reviewed  CBC WITH DIFFERENTIAL/PLATELET  COMPREHENSIVE METABOLIC PANEL  LIPASE, BLOOD  URINALYSIS, ROUTINE W REFLEX MICROSCOPIC  PROTIME-INR  I-STAT TROPONIN, ED  I-STAT CHEM 8, ED    EKG EKG Interpretation  Date/Time:  Sunday June 16 2018 00:46:59 EST Ventricular Rate:  77 PR Interval:    QRS Duration: 100 QT Interval:  423 QTC Calculation: 479 R Axis:   106 Text Interpretation:  Atrial fibrillation Confirmed by Nicanor Alcon, Surabhi Gadea ([id number]) on 06/16/2018 12:50:38 AM   Radiology No results found.  Procedures Procedures (including critical care time)  Medications Ordered in ED Medications  ondansetron (ZOFRAN) injection 4 mg (has no administration in time range)  sodium chloride 0.9 % bolus 500 mL (has no administration in time range)  famotidine (PEPCID) IVPB 20 mg premix (has no administration in time range)       Final Clinical Impressions(s) / ED Diagnoses   Seen by Dr. Cliffton Asters of general surgery    Nandika Stetzer, MD 06/16/18 [phone]

## 2018-06-16 NOTE — ED Notes (Signed)
Patient denies pain and is resting comfortably. Had Fent. Via EMS

## 2018-06-16 NOTE — Consult Note (Signed)
CC: Consult by ED-P Dr. Nicanor Alcon  HPI: Lisa Hopkins is an 85 y.o. female with hx of HTN, DM, CVA, diastolic CHF, Afib (on warfarin), CKD, GERD, IBS with known history of large hiatal hernia - presents to ED for evaluation of n/v which seems to by cyclical. She goes through "spells" that usually last 4-12 hrs every couple of weeks where she gets choked up and has n/v. She has been admitted for this multiple times in the past. She states this is the same as previous admissions; nothing different this go-round. She has been seen by our service in the past, managed with NPO/NG and subsequent resolution of her n/v - she has been deemed at poor surgical candidate given her comorbidities and evaluated by cardiology and deemed to be at high risk for perioperative cardiac events. She has undergone upper GI/EGD for this as well which demonstrate no GOO.   At the time of my evaluation, she reports feeling better. She denies significant chest or abdominal pain. Denies fever/chills. Does have some diarrhea.  She is here with her son. Both her and her son have stated that she would not like to undergo surgery for this and do all they can to medically manage her issues short of an operation.  Past Medical History:  Diagnosis Date  . Anxiety   . Aphasia as late effect of cerebrovascular accident   . Atrial fibrillation (HCC)   . CKD (chronic kidney disease), stage II   . CVA (cerebral infarction)   . Diabetes mellitus type II   . GERD (gastroesophageal reflux disease)   . Hiatal hernia   . HTN (hypertension)   . Hyperlipidemia   . IBS (irritable bowel syndrome)   . Stroke (HCC)   . Vitamin D deficiency     Past Surgical History:  Procedure Laterality Date  . CATARACT EXTRACTION, BILATERAL    . CHOLECYSTECTOMY    . COLONOSCOPY  2005  . ESOPHAGOGASTRODUODENOSCOPY (EGD) WITH PROPOFOL N/A 11/28/2017   Procedure: ESOPHAGOGASTRODUODENOSCOPY (EGD) WITH PROPOFOL;  Surgeon: Graylin Shiver, MD;  Location:  [facility] ENDOSCOPY;  Service: Endoscopy;  Laterality: N/A;  . TONSILLECTOMY    . UPPER GASTROINTESTINAL ENDOSCOPY  2005    Family History  Problem Relation Age of Onset  . Diabetes Mother   . Stroke Mother   . Hypertension Brother   . Heart disease Brother   . Arthritis Sister        Rhematoid  . Arthritis Sister        Rhematoid    Social:  reports that she has never smoked. She has never used smokeless tobacco. She reports that she does not drink alcohol or use drugs.  Allergies:  Allergies  Allergen Reactions  . Ace Inhibitors Other (See Comments)    Cough  . Fosamax [Alendronate Sodium] Other (See Comments)    Heart burn  . Norvasc [Amlodipine Besylate] Other (See Comments)    edema  . Zocor [Simvastatin] Other (See Comments)    Elevated CPK    Medications: I have reviewed the patient's current medications.  Results for orders placed or performed during the hospital encounter of 06/16/18 (from the past 48 hour(s))  CBC with Differential/Platelet     Status: None   Collection Time: 06/16/18  1:35 AM  Result Value Ref Range   WBC 9.5 4.0 - 10.5 K/uL   RBC 4.94 3.87 - 5.11 MIL/uL   Hemoglobin 14.8 12.0 - 15.0 g/dL   HCT 54.0 98.1 - 19.1 %  MCV 93.1 80.0 - 100.0 fL   MCH 30.0 26.0 - 34.0 pg   MCHC 32.2 30.0 - 36.0 g/dL   RDW 84.113.6 32.411.5 - 40.115.5 %   Platelets 231 150 - 400 K/uL   nRBC 0.0 0.0 - 0.2 %   Neutrophils Relative % 81 %   Neutro Abs 7.6 1.7 - 7.7 K/uL   Lymphocytes Relative 16 %   Lymphs Abs 1.5 0.7 - 4.0 K/uL   Monocytes Relative 3 %   Monocytes Absolute 0.3 0.1 - 1.0 K/uL   Eosinophils Relative 0 %   Eosinophils Absolute 0.0 0.0 - 0.5 K/uL   Basophils Relative 0 %   Basophils Absolute 0.0 0.0 - 0.1 K/uL   Immature Granulocytes 0 %   Abs Immature Granulocytes 0.02 0.00 - 0.07 K/uL    Comment: Performed at [facility], [address] [place], [place] [address]  Comprehensive metabolic panel     Status: Abnormal   Collection Time: 06/16/18  1:35  AM  Result Value Ref Range   Sodium 142 135 - 145 mmol/L   Potassium 3.8 3.5 - 5.1 mmol/L   Chloride 103 98 - 111 mmol/L   CO2 25 22 - 32 mmol/L   Glucose, Bld 161 (H) 70 - 99 mg/dL   BUN 11 8 - 23 mg/dL   Creatinine, Ser 3.660.76 0.44 - 1.00 mg/dL   Calcium 9.2 8.9 - 44.010.3 mg/dL   Total Protein 7.2 6.5 - 8.1 g/dL   Albumin 3.9 3.5 - 5.0 g/dL   AST 25 15 - 41 U/L   ALT 22 0 - 44 U/L   Alkaline Phosphatase 122 38 - 126 U/L   Total Bilirubin 0.9 0.3 - 1.2 mg/dL   GFR calc non Af Amer >60 >60 mL/min   GFR calc Af Amer >60 >60 mL/min   Anion gap 14 5 - 15    Comment: Performed at [facility], [address] [place], [place] [address]  Lipase, blood     Status: None   Collection Time: 06/16/18  1:35 AM  Result Value Ref Range   Lipase 41 11 - 51 U/L    Comment: Performed at [facility], [address] [place], [place] [address]  Protime-INR     Status: Abnormal   Collection Time: 06/16/18  1:35 AM  Result Value Ref Range   Prothrombin Time 21.9 (H) 11.4 - 15.2 seconds   INR 1.94     Comment: Performed at [facility], [address] [place], [place] [address]  I-stat troponin, ED     Status: None   Collection Time: 06/16/18  1:56 AM  Result Value Ref Range   Troponin i, poc 0.00 0.00 - 0.08 ng/mL   Comment 3            Comment: Due to the release kinetics of cTnI, a negative result within the first hours of the onset of symptoms does not rule out myocardial infarction with certainty. If myocardial infarction is still suspected, repeat the test at appropriate intervals.   I-Stat Chem 8, ED     Status: Abnormal   Collection Time: 06/16/18  1:57 AM  Result Value Ref Range   Sodium 140 135 - 145 mmol/L   Potassium 3.6 3.5 - 5.1 mmol/L   Chloride 102 98 - 111 mmol/L   BUN 14 8 - 23 mg/dL   Creatinine, Ser 3.320.70 0.44 - 1.00 mg/dL   Glucose, Bld 951157 (  H) 70 - 99 mg/dL   Calcium, Ion 1.61 (L) 1.15 - 1.40 mmol/L   TCO2 29 22 - 32 mmol/L   Hemoglobin 15.0 12.0 -  15.0 g/dL   HCT 09.6 04.5 - 40.9 %    Ct Renal Stone Study  Result Date: 06/16/2018 CLINICAL DATA:  85 year old female with abdominal pain, nausea vomiting and diarrhea. EXAM: CT ABDOMEN AND PELVIS WITHOUT CONTRAST TECHNIQUE: Multidetector CT imaging of the abdomen and pelvis was performed following the standard protocol without IV contrast. COMPARISON:  CT of the abdomen pelvis dated 03/16/2018 FINDINGS: Evaluation of this exam is limited in the absence of intravenous contrast. Lower chest: Large hiatal hernia with the majority of the stomach located lower chest. Associated atelectatic changes of the left lung base. There is coronary vascular calcification. No intra-abdominal free air or free fluid. Hepatobiliary: The liver is unremarkable. No intrahepatic biliary ductal dilatation. Cholecystectomy. Pancreas: Unremarkable. No pancreatic ductal dilatation or surrounding inflammatory changes. Spleen: Normal in size without focal abnormality. Adrenals/Urinary Tract: Adrenal glands are unremarkable. Kidneys are normal, without renal calculi, focal lesion, or hydronephrosis. Bladder is unremarkable. Stomach/Bowel: There is a large hiatal hernia containing the majority of the stomach with apparent organo-axial volvulus similar to prior CT. There is distention of the stomach with air and liquid content, likely representing a degree of gastric outlet obstruction. There is no small bowel obstruction or active inflammation. There is sigmoid diverticulosis without active inflammatory changes. The appendix is not visualized with certainty. No inflammatory changes identified in the right lower quadrant. Vascular/Lymphatic: Moderate aortoiliac atherosclerotic disease. No portal venous gas. There is no adenopathy. Reproductive: Small uterus with a calcified fundal fibroid. No adnexal masses. Other: None Musculoskeletal: Osteopenia with degenerative changes of the spine. No acute osseous pathology. IMPRESSION: 1. Large  hiatal hernia containing the majority of the stomach with possible partial gastric outlet obstruction. This finding is similar to prior studies. 2. Sigmoid diverticulosis. No small bowel obstruction or active inflammation. 3. No hydronephrosis or nephrolithiasis. Electronically Signed   By: Elgie Collard M.D.   On: 06/16/2018 01:47    ROS - all of the below systems have been reviewed with the patient and positives are indicated with bold text General: chills, fever or night sweats Eyes: blurry vision or double vision ENT: epistaxis or sore throat Allergy/Immunology: itchy/watery eyes or nasal congestion Hematologic/Lymphatic: bleeding problems, blood clots or swollen lymph nodes Endocrine: temperature intolerance or unexpected weight changes Breast: new or changing breast lumps or nipple discharge Resp: cough, shortness of breath, or wheezing CV: chest pain (now resolved) or dyspnea on exertion GI: as per HPI GU: dysuria, trouble voiding, or hematuria MSK: joint pain or joint stiffness Neuro: TIA or stroke symptoms Derm: pruritus and skin lesion changes Psych: anxiety and depression  PE Blood pressure (!) 181/104, pulse (!) 50, temperature 98.1 F (36.7 C), temperature source Oral, resp. rate 17, SpO2 94 %. Constitutional: NAD; conversant; no deformities Eyes: Moist conjunctiva; no lid lag; anicteric; PERRL Neck: Trachea midline; no thyromegaly Lungs: Normal respiratory effort; no tactile fremitus CV: RRR; no palpable thrills; no pitting edema GI: Abd soft, NT/ND; no rebound; no guarding; no palpable hepatosplenomegaly MSK: +clubbing; no cyanosis Psychiatric: Appropriate affect; alert and oriented x3 Lymphatic: No palpable cervical or axillary lymphadenopathy  Results for orders placed or performed during the hospital encounter of 06/16/18 (from the past 48 hour(s))  CBC with Differential/Platelet     Status: None   Collection Time: 06/16/18  1:35 AM  Result Value Ref Range  WBC 9.5 4.0 - 10.5 K/uL   RBC 4.94 3.87 - 5.11 MIL/uL   Hemoglobin 14.8 12.0 - 15.0 g/dL   HCT 16.146.0 09.636.0 - 04.546.0 %   MCV 93.1 80.0 - 100.0 fL   MCH 30.0 26.0 - 34.0 pg   MCHC 32.2 30.0 - 36.0 g/dL   RDW 40.913.6 81.111.5 - 91.415.5 %   Platelets 231 150 - 400 K/uL   nRBC 0.0 0.0 - 0.2 %   Neutrophils Relative % 81 %   Neutro Abs 7.6 1.7 - 7.7 K/uL   Lymphocytes Relative 16 %   Lymphs Abs 1.5 0.7 - 4.0 K/uL   Monocytes Relative 3 %   Monocytes Absolute 0.3 0.1 - 1.0 K/uL   Eosinophils Relative 0 %   Eosinophils Absolute 0.0 0.0 - 0.5 K/uL   Basophils Relative 0 %   Basophils Absolute 0.0 0.0 - 0.1 K/uL   Immature Granulocytes 0 %   Abs Immature Granulocytes 0.02 0.00 - 0.07 K/uL    Comment: Performed at [facility], [address] [place], [place] [address]  Comprehensive metabolic panel     Status: Abnormal   Collection Time: 06/16/18  1:35 AM  Result Value Ref Range   Sodium 142 135 - 145 mmol/L   Potassium 3.8 3.5 - 5.1 mmol/L   Chloride 103 98 - 111 mmol/L   CO2 25 22 - 32 mmol/L   Glucose, Bld 161 (H) 70 - 99 mg/dL   BUN 11 8 - 23 mg/dL   Creatinine, Ser 6.210.76 0.44 - 1.00 mg/dL   Calcium 9.2 8.9 - 30.810.3 mg/dL   Total Protein 7.2 6.5 - 8.1 g/dL   Albumin 3.9 3.5 - 5.0 g/dL   AST 25 15 - 41 U/L   ALT 22 0 - 44 U/L   Alkaline Phosphatase 122 38 - 126 U/L   Total Bilirubin 0.9 0.3 - 1.2 mg/dL   GFR calc non Af Amer >60 >60 mL/min   GFR calc Af Amer >60 >60 mL/min   Anion gap 14 5 - 15    Comment: Performed at [facility], [address] [place], [place] [address]  Lipase, blood     Status: None   Collection Time: 06/16/18  1:35 AM  Result Value Ref Range   Lipase 41 11 - 51 U/L    Comment: Performed at [facility], [address] [place], [place] [address]  Protime-INR     Status: Abnormal   Collection Time: 06/16/18  1:35 AM  Result Value Ref Range   Prothrombin Time 21.9 (H) 11.4 - 15.2 seconds   INR 1.94     Comment: Performed at [facility], [address] [place], [place] [address]  I-stat troponin, ED     Status: None   Collection Time: 06/16/18  1:56 AM  Result Value Ref Range   Troponin i, poc 0.00 0.00 - 0.08 ng/mL   Comment 3            Comment: Due to the release kinetics of cTnI, a negative result within the first hours of the onset of symptoms does not rule out myocardial infarction with certainty. If myocardial infarction is still suspected, repeat the test at appropriate intervals.   I-Stat Chem 8, ED     Status: Abnormal   Collection Time: 06/16/18  1:57 AM  Result Value Ref Range   Sodium 140 135 - 145 mmol/L   Potassium 3.6 3.5 -  5.1 mmol/L   Chloride 102 98 - 111 mmol/L   BUN 14 8 - 23 mg/dL   Creatinine, Ser 4.56 0.44 - 1.00 mg/dL   Glucose, Bld 256 (H) 70 - 99 mg/dL   Calcium, Ion 3.89 (L) 1.15 - 1.40 mmol/L   TCO2 29 22 - 32 mmol/L   Hemoglobin 15.0 12.0 - 15.0 g/dL   HCT 37.3 42.8 - 76.8 %    Ct Renal Stone Study  Result Date: 06/16/2018 CLINICAL DATA:  85 year old female with abdominal pain, nausea vomiting and diarrhea. EXAM: CT ABDOMEN AND PELVIS WITHOUT CONTRAST TECHNIQUE: Multidetector CT imaging of the abdomen and pelvis was performed following the standard protocol without IV contrast. COMPARISON:  CT of the abdomen pelvis dated 03/16/2018 FINDINGS: Evaluation of this exam is limited in the absence of intravenous contrast. Lower chest: Large hiatal hernia with the majority of the stomach located lower chest. Associated atelectatic changes of the left lung base. There is coronary vascular calcification. No intra-abdominal free air or free fluid. Hepatobiliary: The liver is unremarkable. No intrahepatic biliary ductal dilatation. Cholecystectomy. Pancreas: Unremarkable. No pancreatic ductal dilatation or surrounding inflammatory changes. Spleen: Normal in size without focal abnormality. Adrenals/Urinary Tract: Adrenal glands are unremarkable. Kidneys are normal, without renal calculi, focal lesion,  or hydronephrosis. Bladder is unremarkable. Stomach/Bowel: There is a large hiatal hernia containing the majority of the stomach with apparent organo-axial volvulus similar to prior CT. There is distention of the stomach with air and liquid content, likely representing a degree of gastric outlet obstruction. There is no small bowel obstruction or active inflammation. There is sigmoid diverticulosis without active inflammatory changes. The appendix is not visualized with certainty. No inflammatory changes identified in the right lower quadrant. Vascular/Lymphatic: Moderate aortoiliac atherosclerotic disease. No portal venous gas. There is no adenopathy. Reproductive: Small uterus with a calcified fundal fibroid. No adnexal masses. Other: None Musculoskeletal: Osteopenia with degenerative changes of the spine. No acute osseous pathology. IMPRESSION: 1. Large hiatal hernia containing the majority of the stomach with possible partial gastric outlet obstruction. This finding is similar to prior studies. 2. Sigmoid diverticulosis. No small bowel obstruction or active inflammation. 3. No hydronephrosis or nephrolithiasis. Electronically Signed   By: Elgie Collard M.D.   On: 06/16/2018 01:47   A/P: NICKOLA WILLAUER is an 85 y.o. female with hx of HTN, DM, CVA, diastolic CHF, Afib (on warfarin), CKD, GERD, IBS - here with chronic, large hiatal hernia containing large portion of her stomach  -AF, VS normal, WBC normal, exam benign. Her imaging appears quite similar to prior presentions - do not think she has ischemia of her stomach. -She is here with her son. Both her and her son have stated that she would not like to undergo surgery for this and do all they can to medically manage her issues short of an operation. They have requested to trial PO and see how she does - if she fails, they plan to bring her in and are thinking about NG tube vs observation.  Stephanie Coup. Cliffton Asters, M.D. [facility]

## 2018-06-16 NOTE — ED Triage Notes (Signed)
Pt arrived via [facility] from [facility].  Pt fine at dinner, then Ches[place] Pain, Abdominal pain N&V  And diarrhea started about 1900.  Pt has extensive history of same

## 2018-06-16 NOTE — Progress Notes (Signed)
Discussed with nursing staff- patient NGT in hiatal hernia and unable to attain proper placement after 2 attempts.  Discussed with on call surgeon who suggests disconnecting tube.  Relayed information to nursing staff.

## 2018-06-16 NOTE — Progress Notes (Signed)
Patient seen and evaluated upon arrival to [facility].  She had just had NGT placed and voicing how uncomfortable it is.  Mentions she feels like gagging.  BP slightly elevated likely due to NGT placement.  [place] showing brown cloudy liquid.  No pain voiced.  Patient would like to rest.  Appreciate general surgery input.  Physical exam with active bowel sounds, clear lung fields and S1S2 with no murmurs.  Continue current treatment plan per Dr. Onalee Hua.  Transition PO metoprolol to IV.

## 2018-06-16 NOTE — ED Notes (Signed)
Delay in lab draw,  MD at bedside. 

## 2018-06-16 NOTE — ED Notes (Signed)
Nurse will collect labs. 

## 2018-06-16 NOTE — H&P (Signed)
History and Physical    Lisa Hopkins ZOX:[id number] DOB: 07/09/1924 DOA: 06/16/2018  PCP: Lucky Cowboy, MD  Patient coming from: Home  Chief Complaint: Nausea vomiting abdominal pain  HPI: Lisa Hopkins is a 85 y.o. female with medical history significant of hiatal hernia causing small bowel obstruction gastric outlet obstruction in the past, A. fib, chronic kidney disease lives in assisted living comes in with over 6 hours of persistent nausea vomiting and abdominal pain generalized at her assisted living.  She is brought to the emergency department and she is feeling much better after some IV fluids and Zofran.  Patient has had surgical evaluation in the past was deemed to be not be good surgical candidate due to her advanced age and multiple comorbidities to fix her hiatal hernia.  Patient is being referred for admission for partial gastric outlet obstruction again.  Review of Systems: As per HPI otherwise 10 point review of systems negative.   Past Medical History:  Diagnosis Date  . Anxiety   . Aphasia as late effect of cerebrovascular accident   . Atrial fibrillation (HCC)   . CKD (chronic kidney disease), stage II   . CVA (cerebral infarction)   . Diabetes mellitus type II   . GERD (gastroesophageal reflux disease)   . Hiatal hernia   . HTN (hypertension)   . Hyperlipidemia   . IBS (irritable bowel syndrome)   . Stroke (HCC)   . Vitamin D deficiency     Past Surgical History:  Procedure Laterality Date  . CATARACT EXTRACTION, BILATERAL    . CHOLECYSTECTOMY    . COLONOSCOPY  2005  . ESOPHAGOGASTRODUODENOSCOPY (EGD) WITH PROPOFOL N/A 11/28/2017   Procedure: ESOPHAGOGASTRODUODENOSCOPY (EGD) WITH PROPOFOL;  Surgeon: Graylin Shiver, MD;  Location: [facility] ENDOSCOPY;  Service: Endoscopy;  Laterality: N/A;  . TONSILLECTOMY    . UPPER GASTROINTESTINAL ENDOSCOPY  2005     reports that she has never smoked. She has never used smokeless tobacco. She reports that she does  not drink alcohol or use drugs.  Allergies  Allergen Reactions  . Ace Inhibitors Other (See Comments)    Cough  . Fosamax [Alendronate Sodium] Other (See Comments)    Heart burn  . Norvasc [Amlodipine Besylate] Other (See Comments)    edema  . Zocor [Simvastatin] Other (See Comments)    Elevated CPK    Family History  Problem Relation Age of Onset  . Diabetes Mother   . Stroke Mother   . Hypertension Brother   . Heart disease Brother   . Arthritis Sister        Rhematoid  . Arthritis Sister        Rhematoid    Prior to Admission medications   Medication Sig Start Date End Date Taking? Authorizing Provider  Cholecalciferol (VITAMIN D) 2000 UNITS CAPS Take 2,000 Units by mouth daily.     [provider]  cholestyramine light (PREVALITE) 4 GM/DOSE powder Take 1 scoop ( 4 gm ) in a glass of Water or Juice 2 to 3 x  /day as needed for Diarrhea 04/30/18   Lucky Cowboy, MD  CINNAMON PO Take 1 capsule by mouth daily.    [provider]  dicyclomine (BENTYL) 20 MG tablet Take 1 tablet 3 x day if needed for nausea, bloating, cramping or diarrhea 05/16/18   Lucky Cowboy, MD  furosemide (LASIX) 20 MG tablet TAKE 1 TABLET EACH DAY. 04/29/18   Lucky Cowboy, MD  losartan (COZAAR) 50 MG tablet TAKE 1/2  TO 1 TABLET ONCE A DAY. 03/26/18   Lucky Cowboy, MD  Magnesium Hydroxide (MAGNESIA PO) Take 1 tablet by mouth daily.     [provider]  metoprolol tartrate (LOPRESSOR) 25 MG tablet Take 0.5 tablets (12.5 mg total) by mouth 2 (two) times daily. 07/16/17 03/16/26  Lucky Cowboy, MD  ondansetron (ZOFRAN ODT) 4 MG disintegrating tablet Dissolve 1 tablet under tongue every 6 to 8 hours if needed for nausea & / or vomitting 04/10/18   Lucky Cowboy, MD  pantoprazole (PROTONIX) 40 MG tablet Take 1 tablet daily for Heartburn & Acid Indigestion 04/10/18   Lucky Cowboy, MD  potassium chloride (K-DUR) 10 MEQ tablet TAKE 1 TABLET BY MOUTH DAILY. 05/15/18    Lucky Cowboy, MD  QUEtiapine (SEROQUEL) 25 MG tablet Take 0.5 tablets (12.5 mg total) by mouth at bedtime. 03/23/18   Regalado, Belkys A, MD  sucralfate (CARAFATE) 1 g tablet Take 1 tablet dissolved in water 4 x /day before meals & bedtime 04/10/18   Lucky Cowboy, MD  warfarin (COUMADIN) 2 MG tablet TAKE AS DIRECTED BY ANTICOAGULATION CLINIC. 05/31/18   Lars Masson, MD    Physical Exam: Vitals:   06/16/18 0200 06/16/18 0215 06/16/18 0230 06/16/18 0245  BP: (!) 180/102 (!) 187/106 (!) 181/96 (!) 181/104  Pulse: 66 77 70 (!) 50  Resp: 17 (!) 23 (!) 25 17  Temp:      TempSrc:      SpO2: 96% 96% 95% 94%      Constitutional: NAD, calm, comfortable Vitals:   06/16/18 0200 06/16/18 0215 06/16/18 0230 06/16/18 0245  BP: (!) 180/102 (!) 187/106 (!) 181/96 (!) 181/104  Pulse: 66 77 70 (!) 50  Resp: 17 (!) 23 (!) 25 17  Temp:      TempSrc:      SpO2: 96% 96% 95% 94%   Eyes: PERRL, lids and conjunctivae normal ENMT: Mucous membranes are moist. Posterior pharynx clear of any exudate or lesions.Normal dentition.  Neck: normal, supple, no masses, no thyromegaly Respiratory: clear to auscultation bilaterally, no wheezing, no crackles. Normal respiratory effort. No accessory muscle use.  Cardiovascular: Regular rate and rhythm, no murmurs / rubs / gallops. No extremity edema. 2+ pedal pulses. No carotid bruits.  Abdomen: no tenderness, no masses palpated. No hepatosplenomegaly. Bowel sounds positive.  Musculoskeletal: no clubbing / cyanosis. No joint deformity upper and lower extremities. Good ROM, no contractures. Normal muscle tone.  Skin: no rashes, lesions, ulcers. No induration Neurologic: CN 2-12 grossly intact. Sensation intact, DTR normal. Strength 5/5 in all 4.  Psychiatric: Normal judgment and insight. Alert and oriented x 3. Normal mood.    Labs on Admission: I have personally reviewed following labs and imaging studies  CBC: Recent Labs  Lab 06/16/18 0135  06/16/18 0157  WBC 9.5  --   NEUTROABS 7.6  --   HGB 14.8 15.0  HCT 46.0 44.0  MCV 93.1  --   PLT 231  --    Basic Metabolic Panel: Recent Labs  Lab 06/16/18 0135 06/16/18 0157  NA 142 140  K 3.8 3.6  CL 103 102  CO2 25  --   GLUCOSE 161* 157*  BUN 11 14  CREATININE 0.76 0.70  CALCIUM 9.2  --    GFR: CrCl cannot be calculated (Unknown ideal weight.). Liver Function Tests: Recent Labs  Lab 06/16/18 0135  AST 25  ALT 22  ALKPHOS 122  BILITOT 0.9  PROT 7.2  ALBUMIN 3.9   Recent Labs  Lab 06/16/18 0135  LIPASE 41   No results for input(s): AMMONIA in the last 168 hours. Coagulation Profile: Recent Labs  Lab 06/10/18 06/16/18 0135  INR 1.8* 1.94   Cardiac Enzymes: No results for input(s): CKTOTAL, CKMB, CKMBINDEX, TROPONINI in the last 168 hours. BNP (last 3 results) No results for input(s): PROBNP in the last 8760 hours. HbA1C: No results for input(s): HGBA1C in the last 72 hours. CBG: No results for input(s): GLUCAP in the last 168 hours. Lipid Profile: No results for input(s): CHOL, HDL, LDLCALC, TRIG, CHOLHDL, LDLDIRECT in the last 72 hours. Thyroid Function Tests: No results for input(s): TSH, T4TOTAL, FREET4, T3FREE, THYROIDAB in the last 72 hours. Anemia Panel: No results for input(s): VITAMINB12, FOLATE, FERRITIN, TIBC, IRON, RETICCTPCT in the last 72 hours. Urine analysis:    Component Value Date/Time   COLORURINE YELLOW 05/27/2018 0917   APPEARANCEUR CLEAR 05/27/2018 0917   LABSPEC 1.010 05/27/2018 0917   PHURINE < OR = 5.0 05/27/2018 0917   GLUCOSEU NEGATIVE 05/27/2018 0917   HGBUR NEGATIVE 05/27/2018 0917   BILIRUBINUR NEGATIVE 03/21/2018 1227   KETONESUR NEGATIVE 05/27/2018 0917   PROTEINUR NEGATIVE 05/27/2018 0917   UROBILINOGEN 1.0 04/02/2015 1244   NITRITE NEGATIVE 05/27/2018 0917   LEUKOCYTESUR NEGATIVE 05/27/2018 0917   Sepsis Labs: !!!!!!!!!!!!!!!!!!!!!!!!!!!!!!!!!!!!!!!!!!!! @LABRCNTIP (procalcitonin:4,lacticidven:4) )No  results found for this or any previous visit (from the past 240 hour(s)).   Radiological Exams on Admission: Ct Renal Stone Study  Result Date: 06/16/2018 CLINICAL DATA:  85 year old female with abdominal pain, nausea vomiting and diarrhea. EXAM: CT ABDOMEN AND PELVIS WITHOUT CONTRAST TECHNIQUE: Multidetector CT imaging of the abdomen and pelvis was performed following the standard protocol without IV contrast. COMPARISON:  CT of the abdomen pelvis dated 03/16/2018 FINDINGS: Evaluation of this exam is limited in the absence of intravenous contrast. Lower chest: Large hiatal hernia with the majority of the stomach located lower chest. Associated atelectatic changes of the left lung base. There is coronary vascular calcification. No intra-abdominal free air or free fluid. Hepatobiliary: The liver is unremarkable. No intrahepatic biliary ductal dilatation. Cholecystectomy. Pancreas: Unremarkable. No pancreatic ductal dilatation or surrounding inflammatory changes. Spleen: Normal in size without focal abnormality. Adrenals/Urinary Tract: Adrenal glands are unremarkable. Kidneys are normal, without renal calculi, focal lesion, or hydronephrosis. Bladder is unremarkable. Stomach/Bowel: There is a large hiatal hernia containing the majority of the stomach with apparent organo-axial volvulus similar to prior CT. There is distention of the stomach with air and liquid content, likely representing a degree of gastric outlet obstruction. There is no small bowel obstruction or active inflammation. There is sigmoid diverticulosis without active inflammatory changes. The appendix is not visualized with certainty. No inflammatory changes identified in the right lower quadrant. Vascular/Lymphatic: Moderate aortoiliac atherosclerotic disease. No portal venous gas. There is no adenopathy. Reproductive: Small uterus with a calcified fundal fibroid. No adnexal masses. Other: None Musculoskeletal: Osteopenia with degenerative  changes of the spine. No acute osseous pathology. IMPRESSION: 1. Large hiatal hernia containing the majority of the stomach with possible partial gastric outlet obstruction. This finding is similar to prior studies. 2. Sigmoid diverticulosis. No small bowel obstruction or active inflammation. 3. No hydronephrosis or nephrolithiasis. Electronically Signed   By: Elgie Collard M.D.   On: 06/16/2018 01:47   Old chart reviewed Case discussed with Dr. Daun Peacock in the ED  Assessment/Plan 85 year old female with partia gastric outlet obstruction Principal Problem: Gastric outlet obstruction (small bowel obstruction) (HCC)-partial.  Patient already improved with conservative treatment in the ED.  IV fluids.  NG tube.  Surgical consultation requested.  Hopefully she will improve with conservative measures.  N.p.o. except ice chips and meds.  Abdominal exam benign.  Active Problems:   Hiatal hernia-noted    Essential hypertension-holding meds at this time    Atrial fibrillation (HCC)-currently rate controlled chronic anticoagulated will check INR    History of CVA (cerebrovascular accident)-noted    Diastolic CHF (HCC)-compensated at this time hold diuretics    CKD stage 2 due to type 2 diabetes mellitus (HCC)-at baseline   Med rec pending pharmacy review   DVT prophylaxis: On Coumadin Code Status: Full Family Communication: Son Disposition Plan: 1 to 4 days Consults called: General surgery Admission status: Admission   Lisa Hopkins A MD [facility]  If 7PM-7AM, please contact night-coverage [email or web address] Password [facility]  06/16/2018, 3:18 AM

## 2018-06-17 DIAGNOSIS — K311 Adult hypertrophic pyloric stenosis: Secondary | ICD-10-CM

## 2018-06-17 DIAGNOSIS — K449 Diaphragmatic hernia without obstruction or gangrene: Principal | ICD-10-CM

## 2018-06-17 MED ORDER — WARFARIN SODIUM 5 MG PO TABS
5.0000 mg | ORAL_TABLET | Freq: Once | ORAL | Status: AC
  Administered 2018-06-17: 5 mg via ORAL
  Filled 2018-06-17: qty 1

## 2018-06-17 MED ORDER — METOPROLOL TARTRATE 12.5 MG HALF TABLET
12.5000 mg | ORAL_TABLET | Freq: Two times a day (BID) | ORAL | Status: DC
  Administered 2018-06-17 – 2018-06-20 (×6): 12.5 mg via ORAL
  Filled 2018-06-17 (×6): qty 1

## 2018-06-17 MED ORDER — BOOST / RESOURCE BREEZE PO LIQD CUSTOM
1.0000 | Freq: Two times a day (BID) | ORAL | Status: DC
  Administered 2018-06-17 – 2018-06-19 (×5): 1 via ORAL

## 2018-06-17 MED ORDER — PANTOPRAZOLE SODIUM 40 MG PO TBEC
40.0000 mg | DELAYED_RELEASE_TABLET | Freq: Every day | ORAL | Status: DC
  Administered 2018-06-17 – 2018-06-20 (×4): 40 mg via ORAL
  Filled 2018-06-17 (×4): qty 1

## 2018-06-17 MED ORDER — QUETIAPINE 12.5 MG HALF TABLET
12.5000 mg | ORAL_TABLET | Freq: Every day | ORAL | Status: DC
  Administered 2018-06-17 – 2018-06-19 (×3): 12.5 mg via ORAL
  Filled 2018-06-17 (×3): qty 1

## 2018-06-17 MED ORDER — WARFARIN - PHARMACIST DOSING INPATIENT: Freq: Every day | Status: DC

## 2018-06-17 NOTE — Discharge Instructions (Signed)

## 2018-06-17 NOTE — Progress Notes (Signed)
PROGRESS NOTE    Lisa GalloFrances H Sinopoli   WGN:[id number]  DOB: 1924/10/04  DOA: 06/16/2018 PCP: Lucky CowboyMcKeown, William, MD   Brief Narrative:  Lisa Hopkins  a 85 y.o. female with medical history significant of hiatal hernia causing small bowel obstruction gastric outlet obstruction in the past, A. fib, chronic kidney disease lives in assisted living comes in with over 6 hours of persistent nausea vomiting and abdominal pain generalized at her assisted living.    Subjective: Pain has improved and so has nausea. She is agreeable to start clears.     Assessment & Plan:   Principal Problem:   Vomiting due to large hiatal hernia and ? GOO - poor candidate for surgery and not interested in surgery either- started clears - will advance as tolerated - d/c IVF today-  - resume Protonix which she takes at home  Active Problems:   Essential hypertension -resume Metoprolol - hold Cozaar and follow BP    Atrial fibrillation - resume Coumadin and Metoprolol    History of CVA (cerebrovascular accident)    Diastolic CHF (HCC) - hold lasix for now  Time spent in minutes: 35 DVT prophylaxis: SCDs until INR therapeutic Code Status: Full code Family Communication: son  Disposition Plan: home  Consultants:   gen surgery Procedures:   none Antimicrobials:  Anti-infectives (From admission, onward)   None       Objective: Vitals:   06/16/18 1841 06/16/18 2105 06/17/18 0356 06/17/18 1409  BP: (!) 159/100 (!) 161/86 (!) 152/95 (!) 148/91  Pulse:  91 93 66  Resp:  18 18 14   Temp:  97.6 F (36.4 C) 97.8 F (36.6 C) 98.1 F (36.7 C)  TempSrc:  Oral Oral Oral  SpO2:  95% 93%   Weight:   51.3 kg   Height:        Intake/Output Summary (Last 24 hours) at 06/17/2018 1649 Last data filed at 06/17/2018 0846 Gross per 24 hour  Intake 891.82 ml  Output 750 ml  Net 141.82 ml   Filed Weights   06/16/18 1152 06/17/18 0356  Weight: 51.5 kg 51.3 kg    Examination: General exam:  Appears comfortable  HEENT: PERRLA, oral mucosa moist, no sclera icterus or thrush Respiratory system: Clear to auscultation. Respiratory effort normal. Cardiovascular system: S1 & S2 heard, RRR.   Gastrointestinal system: Abdomen soft, non-tender, nondistended. Normal bowel sounds. Central nervous system: Alert and oriented. No focal neurological deficits. Extremities: No cyanosis, clubbing or edema Skin: No rashes or ulcers Psychiatry:  Mood & affect appropriate.     Data Reviewed: I have personally reviewed following labs and imaging studies  CBC: Recent Labs  Lab 06/16/18 0135 06/16/18 0157 06/16/18 0411  WBC 9.5  --  9.7  NEUTROABS 7.6  --   --   HGB 14.8 15.0 14.5  HCT 46.0 44.0 45.3  MCV 93.1  --  92.3  PLT 231  --  204   Basic Metabolic Panel: Recent Labs  Lab 06/16/18 0135 06/16/18 0157 06/16/18 0411  NA 142 140 141  K 3.8 3.6 4.0  CL 103 102 103  CO2 25  --  27  GLUCOSE 161* 157* 147*  BUN 11 14 12   CREATININE 0.76 0.70 0.87  CALCIUM 9.2  --  8.9   GFR: Estimated Creatinine Clearance: 33.3 mL/min (by C-G formula based on SCr of 0.87 mg/dL). Liver Function Tests: Recent Labs  Lab 06/16/18 0135  AST 25  ALT 22  ALKPHOS 122  BILITOT 0.9  PROT 7.2  ALBUMIN 3.9   Recent Labs  Lab 06/16/18 0135  LIPASE 41   No results for input(s): AMMONIA in the last 168 hours. Coagulation Profile: Recent Labs  Lab 06/16/18 0135 06/16/18 0411  INR 1.94 1.83   Cardiac Enzymes: No results for input(s): CKTOTAL, CKMB, CKMBINDEX, TROPONINI in the last 168 hours. BNP (last 3 results) No results for input(s): PROBNP in the last 8760 hours. HbA1C: No results for input(s): HGBA1C in the last 72 hours. CBG: No results for input(s): GLUCAP in the last 168 hours. Lipid Profile: No results for input(s): CHOL, HDL, LDLCALC, TRIG, CHOLHDL, LDLDIRECT in the last 72 hours. Thyroid Function Tests: No results for input(s): TSH, T4TOTAL, FREET4, T3FREE, THYROIDAB in  the last 72 hours. Anemia Panel: No results for input(s): VITAMINB12, FOLATE, FERRITIN, TIBC, IRON, RETICCTPCT in the last 72 hours. Urine analysis:    Component Value Date/Time   COLORURINE YELLOW 06/16/2018 0843   APPEARANCEUR CLEAR 06/16/2018 0843   LABSPEC 1.014 06/16/2018 0843   PHURINE 6.0 06/16/2018 0843   GLUCOSEU NEGATIVE 06/16/2018 0843   HGBUR SMALL (A) 06/16/2018 0843   BILIRUBINUR NEGATIVE 06/16/2018 0843   KETONESUR 5 (A) 06/16/2018 0843   PROTEINUR NEGATIVE 06/16/2018 0843   UROBILINOGEN 1.0 04/02/2015 1244   NITRITE NEGATIVE 06/16/2018 0843   LEUKOCYTESUR NEGATIVE 06/16/2018 0843   Sepsis Labs: @LABRCNTIP (procalcitonin:4,lacticidven:4) )No results found for this or any previous visit (from the past 240 hour(s)).       Radiology Studies: Dg Abd 1 View  Result Date: 06/16/2018 CLINICAL DATA:  Evaluate nasogastric tube placement. EXAM: ABDOMEN - 1 VIEW COMPARISON:  CT 06/16/2018 FINDINGS: Tip of the nasogastric tube is in the lower chest and may be near the GE junction. Patient has a very large hiatal hernia containing gas. NG tube side hole in the mid esophagus at the level of the carina. IMPRESSION: Nasogastric tube tip in the lower chest probably near the GE junction. Large hiatal hernia containing gas. Electronically Signed   By: Richarda OverlieAdam  Henn M.D.   On: 06/16/2018 12:44   Ct Renal Stone Study  Result Date: 06/16/2018 CLINICAL DATA:  85 year old female with abdominal pain, nausea vomiting and diarrhea. EXAM: CT ABDOMEN AND PELVIS WITHOUT CONTRAST TECHNIQUE: Multidetector CT imaging of the abdomen and pelvis was performed following the standard protocol without IV contrast. COMPARISON:  CT of the abdomen pelvis dated 03/16/2018 FINDINGS: Evaluation of this exam is limited in the absence of intravenous contrast. Lower chest: Large hiatal hernia with the majority of the stomach located lower chest. Associated atelectatic changes of the left lung base. There is coronary  vascular calcification. No intra-abdominal free air or free fluid. Hepatobiliary: The liver is unremarkable. No intrahepatic biliary ductal dilatation. Cholecystectomy. Pancreas: Unremarkable. No pancreatic ductal dilatation or surrounding inflammatory changes. Spleen: Normal in size without focal abnormality. Adrenals/Urinary Tract: Adrenal glands are unremarkable. Kidneys are normal, without renal calculi, focal lesion, or hydronephrosis. Bladder is unremarkable. Stomach/Bowel: There is a large hiatal hernia containing the majority of the stomach with apparent organo-axial volvulus similar to prior CT. There is distention of the stomach with air and liquid content, likely representing a degree of gastric outlet obstruction. There is no small bowel obstruction or active inflammation. There is sigmoid diverticulosis without active inflammatory changes. The appendix is not visualized with certainty. No inflammatory changes identified in the right lower quadrant. Vascular/Lymphatic: Moderate aortoiliac atherosclerotic disease. No portal venous gas. There is no adenopathy. Reproductive: Small uterus with a calcified fundal fibroid. No adnexal  masses. Other: None Musculoskeletal: Osteopenia with degenerative changes of the spine. No acute osseous pathology. IMPRESSION: 1. Large hiatal hernia containing the majority of the stomach with possible partial gastric outlet obstruction. This finding is similar to prior studies. 2. Sigmoid diverticulosis. No small bowel obstruction or active inflammation. 3. No hydronephrosis or nephrolithiasis. Electronically Signed   By: Elgie Collard M.D.   On: 06/16/2018 01:47      Scheduled Meds: . feeding supplement  1 Container Oral BID BM  . metoprolol tartrate  2.5 mg Intravenous Q12H  . pantoprazole  40 mg Oral Daily  . warfarin  5 mg Oral ONCE-1800  . Warfarin - Pharmacist Dosing Inpatient   Does not apply q1800   Continuous Infusions: . sodium chloride 75 mL/hr at  06/17/18 1118  . famotidine (PEPCID) IV       LOS: 1 day      Calvert Cantor, MD [facility] Pager: [email or web address] Password [facility] 06/17/2018, 4:49 PM

## 2018-06-17 NOTE — Progress Notes (Signed)
ANTICOAGULATION CONSULT NOTE - Initial Consult  Pharmacy Consult:  Coumadin Indication: atrial fibrillation  Allergies  Allergen Reactions  . Ace Inhibitors Other (See Comments)    Cough  . Fosamax [Alendronate Sodium] Other (See Comments)    Heart burn  . Norvasc [Amlodipine Besylate] Other (See Comments)    edema  . Zocor [Simvastatin] Other (See Comments)    Elevated CPK    Patient Measurements: Height: 5\' 2"  (157.5 cm) Weight: 113 lb 1.5 oz (51.3 kg) IBW/kg (Calculated) : 50.1  Vital Signs: Temp: 97.8 F (36.6 C) (01/20 0356) Temp Source: Oral (01/20 0356) BP: 152/95 (01/20 0356) Pulse Rate: 93 (01/20 0356)  Labs: Recent Labs    06/16/18 0135 06/16/18 0157 06/16/18 0411  HGB 14.8 15.0 14.5  HCT 46.0 44.0 45.3  PLT 231  --  204  LABPROT 21.9*  --  21.0*  INR 1.94  --  1.83  CREATININE 0.76 0.70 0.87    Estimated Creatinine Clearance: 33.3 mL/min (by C-G formula based on SCr of 0.87 mg/dL).   Medical History: Past Medical History:  Diagnosis Date  . Anxiety   . Aphasia as late effect of cerebrovascular accident   . Atrial fibrillation (HCC)   . CKD (chronic kidney disease), stage II   . CVA (cerebral infarction)   . Diabetes mellitus type II   . GERD (gastroesophageal reflux disease)   . Hiatal hernia   . HTN (hypertension)   . Hyperlipidemia   . IBS (irritable bowel syndrome)   . Stroke (HCC)   . Vitamin D deficiency     Assessment: 76 YOF presented with nausea, vomiting and abdominal pain.  Patient has hiatal hernia and decided against surgery.  Pharmacy consulted to resume Coumadin from PTA for history of Afib.  INR sub-therapeutic; no bleeding reported.  Home Coumadin dose: 2mg  PO daily except 3mg  on Mon and Fri   Goal of Therapy:  INR 2-3 Monitor platelets by anticoagulation protocol: Yes   Plan:  Coumadin 5mg  PO today Daily PT / INR Consider increasing IV metoprolol frequency to Q6H   Alfrieda Tarry D. Laney Potash, PharmD, BCPS, BCCCP 06/17/2018,  9:08 AM

## 2018-06-17 NOTE — Progress Notes (Signed)
[facility] Progress Note     Subjective: CC-  Sitting up in bed, son at bedside. Patient admitted yesterday due to recurrent nausea/vomiting secondary to large hiatal hernia. NG tube placed yesterday and came out yesterday afternoon. Patient denies any n/v since admission. She did have a BM in the ED yesterday. No flatus today.  Patient was started on clear liquids this morning by primary team, she has not had anything to drink yet.  Lives in independent living at [facility] with a walker  Objective: Vital signs in last 24 hours: Temp:  [97.6 F (36.4 C)-98.4 F (36.9 C)] 97.8 F (36.6 C) (01/20 0356) Pulse Rate:  [87-93] 93 (01/20 0356) Resp:  [18-20] 18 (01/20 0356) BP: (152-173)/(86-100) 152/95 (01/20 0356) SpO2:  [93 %-97 %] 93 % (01/20 0356) Weight:  [51.3 kg-51.5 kg] 51.3 kg (01/20 0356) Last BM Date: 06/16/18  Intake/Output from previous day: 01/19 0701 - 01/20 0700 In: 1614.1 [I.V.:1614.1] Out: 750 [Urine:750] Intake/Output this shift: No intake/output data recorded.  PE: Gen:  Alert, NAD, pleasant HEENT: EOM's intact, pupils equal and round Card:  Irregularly irregular Pulm:  CTAB, no W/R/R, effort normal Abd: Soft, NT/ND, +BS, no HSM Skin: warm and dry  Lab Results:  Recent Labs    06/16/18 0135 06/16/18 0157 06/16/18 0411  WBC 9.5  --  9.7  HGB 14.8 15.0 14.5  HCT 46.0 44.0 45.3  PLT 231  --  204   BMET Recent Labs    06/16/18 0135 06/16/18 0157 06/16/18 0411  NA 142 140 141  K 3.8 3.6 4.0  CL 103 102 103  CO2 25  --  27  GLUCOSE 161* 157* 147*  BUN 11 14 12   CREATININE 0.76 0.70 0.87  CALCIUM 9.2  --  8.9   PT/INR Recent Labs    06/16/18 0135 06/16/18 0411  LABPROT 21.9* 21.0*  INR 1.94 1.83   CMP     Component Value Date/Time   NA 141 06/16/2018 0411   K 4.0 06/16/2018 0411   CL 103 06/16/2018 0411   CO2 27 06/16/2018 0411   GLUCOSE 147 (H) 06/16/2018 0411   BUN 12 06/16/2018 0411   CREATININE  0.87 06/16/2018 0411   CREATININE 0.85 05/27/2018 0917   CALCIUM 8.9 06/16/2018 0411   PROT 7.2 06/16/2018 0135   ALBUMIN 3.9 06/16/2018 0135   AST 25 06/16/2018 0135   ALT 22 06/16/2018 0135   ALKPHOS 122 06/16/2018 0135   BILITOT 0.9 06/16/2018 0135   GFRNONAA 58 (L) 06/16/2018 0411   GFRNONAA 60 05/27/2018 0917   GFRAA >60 06/16/2018 0411   GFRAA 69 05/27/2018 0917   Lipase     Component Value Date/Time   LIPASE 41 06/16/2018 0135       Studies/Results: Dg Abd 1 View  Result Date: 06/16/2018 CLINICAL DATA:  Evaluate nasogastric tube placement. EXAM: ABDOMEN - 1 VIEW COMPARISON:  CT 06/16/2018 FINDINGS: Tip of the nasogastric tube is in the lower chest and may be near the GE junction. Patient has a very large hiatal hernia containing gas. NG tube side hole in the mid esophagus at the level of the carina. IMPRESSION: Nasogastric tube tip in the lower chest probably near the GE junction. Large hiatal hernia containing gas. Electronically Signed   By: Richarda Overlie M.D.   On: 06/16/2018 12:44   Ct Renal Stone Study  Result Date: 06/16/2018 CLINICAL DATA:  85 year old female with abdominal pain, nausea vomiting and diarrhea. EXAM: CT ABDOMEN  AND PELVIS WITHOUT CONTRAST TECHNIQUE: Multidetector CT imaging of the abdomen and pelvis was performed following the standard protocol without IV contrast. COMPARISON:  CT of the abdomen pelvis dated 03/16/2018 FINDINGS: Evaluation of this exam is limited in the absence of intravenous contrast. Lower chest: Large hiatal hernia with the majority of the stomach located lower chest. Associated atelectatic changes of the left lung base. There is coronary vascular calcification. No intra-abdominal free air or free fluid. Hepatobiliary: The liver is unremarkable. No intrahepatic biliary ductal dilatation. Cholecystectomy. Pancreas: Unremarkable. No pancreatic ductal dilatation or surrounding inflammatory changes. Spleen: Normal in size without focal  abnormality. Adrenals/Urinary Tract: Adrenal glands are unremarkable. Kidneys are normal, without renal calculi, focal lesion, or hydronephrosis. Bladder is unremarkable. Stomach/Bowel: There is a large hiatal hernia containing the majority of the stomach with apparent organo-axial volvulus similar to prior CT. There is distention of the stomach with air and liquid content, likely representing a degree of gastric outlet obstruction. There is no small bowel obstruction or active inflammation. There is sigmoid diverticulosis without active inflammatory changes. The appendix is not visualized with certainty. No inflammatory changes identified in the right lower quadrant. Vascular/Lymphatic: Moderate aortoiliac atherosclerotic disease. No portal venous gas. There is no adenopathy. Reproductive: Small uterus with a calcified fundal fibroid. No adnexal masses. Other: None Musculoskeletal: Osteopenia with degenerative changes of the spine. No acute osseous pathology. IMPRESSION: 1. Large hiatal hernia containing the majority of the stomach with possible partial gastric outlet obstruction. This finding is similar to prior studies. 2. Sigmoid diverticulosis. No small bowel obstruction or active inflammation. 3. No hydronephrosis or nephrolithiasis. Electronically Signed   By: Elgie Collard M.D.   On: 06/16/2018 01:47    Anti-infectives: Anti-infectives (From admission, onward)   None       Assessment/Plan HTN DM-II H/o CVA Diastolic CHF A fib - on warfarin CKD-II GERD IBS  Large hiatal hernia - NG out yesterday. Agree with PO trial. Start with clear liquids/Boost Breeze as tolerated. Encourage OOB/mobilize.  ID - none FEN - IVF, CLD, Boost Breeze VTE - coumadin Foley - none   LOS: 1 day    Franne Forts , [facility] 06/17/2018, 10:26 AM Pager: [phone] Mon-Thurs 7:00 am-4:30 pm Fri 7:00 am -11:30 AM Sat-Sun 7:00 am-11:30 am

## 2018-06-18 LAB — PROTIME-INR
INR: 1.84
Prothrombin Time: 21.1 seconds — ABNORMAL HIGH (ref 11.4–15.2)

## 2018-06-18 LAB — GLUCOSE, CAPILLARY: Glucose-Capillary: 104 mg/dL — ABNORMAL HIGH (ref 70–99)

## 2018-06-18 MED ORDER — LOSARTAN POTASSIUM 50 MG PO TABS
50.0000 mg | ORAL_TABLET | ORAL | Status: DC
  Administered 2018-06-18 – 2018-06-19 (×2): 50 mg via ORAL
  Filled 2018-06-18 (×2): qty 1

## 2018-06-18 MED ORDER — SODIUM CHLORIDE 0.45 % IV SOLN
INTRAVENOUS | Status: DC
  Administered 2018-06-18 – 2018-06-19 (×2): via INTRAVENOUS

## 2018-06-18 MED ORDER — WARFARIN SODIUM 3 MG PO TABS
3.0000 mg | ORAL_TABLET | Freq: Once | ORAL | Status: AC
  Administered 2018-06-18: 3 mg via ORAL
  Filled 2018-06-18: qty 1

## 2018-06-18 MED ORDER — SODIUM CHLORIDE 0.9 % IV SOLN
INTRAVENOUS | Status: DC
  Administered 2018-06-18: 13:00:00 via INTRAVENOUS

## 2018-06-18 NOTE — Care Management Note (Signed)
Case Management Note  Patient Details  Name: Lisa Hopkins MRN: [id number] Date of Birth: 06-Nov-1924  Subjective/Objective:                    Action/Plan:  Spoke to patient and son Rosanne Ashing at bedside. Patient from Independent Living at [facility] . Patient and son want patient to return with home health through [facility] RN and aide. Dara Hoyer with [facility] aware patient admitted. Will enter resumption of care orders. Expected Discharge Date:                  Expected Discharge Plan:  Home w Home Health Services  In-House Referral:     Discharge planning Services  CM Consult  Post Acute Care Choice:  Home Health Choice offered to:  Patient, Adult Children  DME Arranged:  N/A DME Agency:  NA  HH Arranged:  RN, Nurse's Aide HH Agency:  [facility]  Status of Service:  In process, will continue to follow  If discussed at Long Length of Stay Meetings, dates discussed:    Additional Comments:  Kingsley Plan, RN 06/18/2018, 2:34 PM

## 2018-06-18 NOTE — Progress Notes (Signed)
ANTICOAGULATION CONSULT NOTE  Pharmacy Consult:  Coumadin Indication: atrial fibrillation  Allergies  Allergen Reactions  . Ace Inhibitors Other (See Comments)    Cough  . Fosamax [Alendronate Sodium] Other (See Comments)    Heart burn  . Norvasc [Amlodipine Besylate] Other (See Comments)    edema  . Zocor [Simvastatin] Other (See Comments)    Elevated CPK    Patient Measurements: Height: 5\' 2"  (157.5 cm) Weight: 114 lb 3.2 oz (51.8 kg) IBW/kg (Calculated) : 50.1  Vital Signs: Temp: 98.3 F (36.8 C) (01/21 0420) Temp Source: Oral (01/21 0420) BP: 144/78 (01/21 0420) Pulse Rate: 74 (01/21 0420)  Labs: Recent Labs    06/16/18 0135 06/16/18 0157 06/16/18 0411 06/18/18 0531  HGB 14.8 15.0 14.5  --   HCT 46.0 44.0 45.3  --   PLT 231  --  204  --   LABPROT 21.9*  --  21.0* 21.1*  INR 1.94  --  1.83 1.84  CREATININE 0.76 0.70 0.87  --     Estimated Creatinine Clearance: 33.3 mL/min (by C-G formula based on SCr of 0.87 mg/dL).   Assessment: 84 YOF presented with nausea, vomiting and abdominal pain.  Patient has hiatal hernia and decided against surgery.  Pharmacy consulted to continue Coumadin from PTA for history of Afib.  INR sub-therapeutic; no bleeding reported.  Home Coumadin dose: 2mg  PO daily except 3mg  on Mon and Fri   Goal of Therapy:  INR 2-3 Monitor platelets by anticoagulation protocol: Yes   Plan:  Coumadin 3mg  PO today Daily PT / INR   Anni Hocevar D. Laney Potash, PharmD, BCPS, BCCCP 06/18/2018, 10:12 AM

## 2018-06-18 NOTE — Consult Note (Signed)
Subjective:   HPI  The patient is a 85 year old female who I have seen in the past. She has multiple medical problems. We are asked to see her in regards to vomiting and a large hiatal hernia. She has a known large hiatal hernia/intrathoracic stomach which has caused her problems with vomiting in the past. She has had several admissions for this in the past. She was readmitted to the hospital again with nausea and vomiting. She has been seen by surgery. She is a poor surgical candidate and she and her family do not want surgery for correction of this problem. She had an endoscopy in July of last year which showed a large hiatal hernia but there was no obstruction to passage of the scope into the duodenum. At the present time she states she is feeling better and has tolerated some liquids. She has not had any vomiting yesterday or today.  Review of Systems No chest pain or shortness of breath  Past Medical History:  Diagnosis Date  . Anxiety   . Aphasia as late effect of cerebrovascular accident   . Atrial fibrillation (HCC)   . CKD (chronic kidney disease), stage II   . CVA (cerebral infarction)   . Diabetes mellitus type II   . GERD (gastroesophageal reflux disease)   . Hiatal hernia   . HTN (hypertension)   . Hyperlipidemia   . IBS (irritable bowel syndrome)   . Stroke (HCC)   . Vitamin D deficiency    Past Surgical History:  Procedure Laterality Date  . CATARACT EXTRACTION, BILATERAL    . CHOLECYSTECTOMY    . COLONOSCOPY  2005  . ESOPHAGOGASTRODUODENOSCOPY (EGD) WITH PROPOFOL N/A 11/28/2017   Procedure: ESOPHAGOGASTRODUODENOSCOPY (EGD) WITH PROPOFOL;  Surgeon: Graylin ShiverGanem, Salem F, MD;  [facility];  Service: Endoscopy;  Laterality: N/A;  . TONSILLECTOMY    . UPPER GASTROINTESTINAL ENDOSCOPY  2005   Social History   Socioeconomic History  . Marital status: Widowed    Spouse name: Not on file  . Number of children: Not on file  . Years of education: Not on file  .  Highest education level: Not on file  Occupational History  . Not on file  Social Needs  . Financial resource strain: Not on file  . Food insecurity:    Worry: Not on file    Inability: Not on file  . Transportation needs:    Medical: Not on file    Non-medical: Not on file  Tobacco Use  . Smoking status: Never Smoker  . Smokeless tobacco: Never Used  Substance and Sexual Activity  . Alcohol use: No  . Drug use: No  . Sexual activity: Not Currently  Lifestyle  . Physical activity:    Days per week: Not on file    Minutes per session: Not on file  . Stress: Not on file  Relationships  . Social connections:    Talks on phone: Not on file    Gets together: Not on file    Attends religious service: Not on file    Active member of club or organization: Not on file    Attends meetings of clubs or organizations: Not on file    Relationship status: Not on file  . Intimate partner violence:    Fear of current or ex partner: Not on file    Emotionally abused: Not on file    Physically abused: Not on file    Forced sexual activity: Not on file  Other Topics Concern  .  Not on file  Social History Narrative  . Not on file   family history includes Arthritis in her sister and sister; Diabetes in her mother; Heart disease in her brother; Hypertension in her brother; Stroke in her mother.  Current Facility-Administered Medications:  .  0.9 %  sodium chloride infusion, , Intravenous, Continuous, Rizwan, Saima, MD .  famotidine (PEPCID) IVPB 20 mg premix, 20 mg, Intravenous, Once, Rinaldo Ratel, Criss Alvine, MD .  feeding supplement (BOOST / RESOURCE BREEZE) liquid 1 Container, 1 Container, Oral, BID BM, Meuth, Brooke A, PA-C, 1 Container at 06/18/18 [phone] .  metoprolol tartrate (LOPRESSOR) tablet 12.5 mg, 12.5 mg, Oral, BID, Rizwan, Saima, MD, 12.5 mg at 06/18/18 0910 .  ondansetron (ZOFRAN) tablet 4 mg, 4 mg, Oral, Q6H PRN **OR** ondansetron (ZOFRAN) injection 4 mg, 4 mg, Intravenous, Q6H  PRN, Haydee Monica, MD, 4 mg at 06/17/18 1541 .  pantoprazole (PROTONIX) EC tablet 40 mg, 40 mg, Oral, Daily, Rizwan, Saima, MD, 40 mg at 06/18/18 0910 .  QUEtiapine (SEROQUEL) tablet 12.5 mg, 12.5 mg, Oral, QHS, Rizwan, Saima, MD, 12.5 mg at 06/17/18 2137 .  warfarin (COUMADIN) tablet 3 mg, 3 mg, Oral, ONCE-1800, Dang, Thuy D, RPH .  Warfarin - Pharmacist Dosing Inpatient, , Does not apply, q1800, Dang, Thuy D, [facility] Allergies  Allergen Reactions  . Ace Inhibitors Other (See Comments)    Cough  . Fosamax [Alendronate Sodium] Other (See Comments)    Heart burn  . Norvasc [Amlodipine Besylate] Other (See Comments)    edema  . Zocor [Simvastatin] Other (See Comments)    Elevated CPK     Objective:     BP (!) 144/78 (BP Location: Right Arm)   Pulse 74   Temp 98.3 F (36.8 C) (Oral)   Resp 20   Ht 5\' 2"  (1.575 m)   Wt 51.8 kg   SpO2 94%   BMI 20.89 kg/m   She is in no acute distress  Heart regular rhythm  Lungs clear  Abdomen soft and nontender  Laboratory No components found for: D1    Assessment:     Nausea and vomiting secondary to large hiatal hernia/intrathoracic stomach.      Plan:     Since she is feeling better today and has tolerated some liquids I don't think any further testing is needed. I doubt that there is anything more we can do from a GI standpoint. Advance her diet as tolerated. Call us again if needed.

## 2018-06-18 NOTE — Plan of Care (Signed)
  Problem: Education: Goal: Knowledge of General Education information will improve Description Including pain rating scale, medication(s)/side effects and non-pharmacologic comfort measures 06/18/2018 0218 by Hurshel Party, RN Outcome: Progressing 06/18/2018 0005 by Hurshel Party, RN Outcome: Progressing   Problem: Clinical Measurements: Goal: Ability to maintain clinical measurements within normal limits will improve 06/18/2018 0218 by Hurshel Party, RN Outcome: Progressing 06/18/2018 0005 by Hurshel Party, RN Outcome: Progressing Goal: Will remain free from infection 06/18/2018 0218 by Hurshel Party, RN Outcome: Progressing 06/18/2018 0005 by Hurshel Party, RN Outcome: Progressing   Problem: Activity: Goal: Risk for activity intolerance will decrease 06/18/2018 0218 by Hurshel Party, RN Outcome: Progressing 06/18/2018 0005 by Hurshel Party, RN Outcome: Progressing   Problem: Nutrition: Goal: Adequate nutrition will be maintained 06/18/2018 0218 by Hurshel Party, RN Outcome: Progressing 06/18/2018 0005 by Hurshel Party, RN Outcome: Progressing   Problem: Coping: Goal: Level of anxiety will decrease 06/18/2018 0218 by Hurshel Party, RN Outcome: Progressing 06/18/2018 0005 by Hurshel Party, RN Outcome: Progressing   Problem: Elimination: Goal: Will not experience complications related to bowel motility 06/18/2018 0218 by Hurshel Party, RN Outcome: Progressing 06/18/2018 0005 by Hurshel Party, RN Outcome: Progressing Goal: Will not experience complications related to urinary retention Outcome: Progressing   Problem: Pain Managment: Goal: General experience of comfort will improve 06/18/2018 0218 by Hurshel Party, RN Outcome: Progressing 06/18/2018 0005 by Hurshel Party, RN Outcome: Progressing   Problem: Safety: Goal: Ability to remain free from injury will improve 06/18/2018 0218  by Hurshel Party, RN Outcome: Progressing 06/18/2018 0005 by Hurshel Party, RN Outcome: Progressing   Problem: Skin Integrity: Goal: Risk for impaired skin integrity will decrease Outcome: Progressing

## 2018-06-18 NOTE — Progress Notes (Signed)
PROGRESS NOTE    Lisa Hopkins   UYQ:[id number]  DOB: 12/31/1924  DOA: 06/16/2018 PCP: Lucky CowboyMcKeown, William, MD   Brief Narrative:  Lisa Hopkins  a 85 y.o. female with medical history significant of hiatal hernia causing small bowel obstruction gastric outlet obstruction in the past, A. fib, chronic kidney disease lives in assisted living comes in with over 6 hours of persistent nausea vomiting and abdominal pain generalized at her assisted living.    Subjective: Barely had any clear liquids for breakfast- did not have any for dinner yesterday. I have told her that we have to resume the IVF until I see her drinking enough liquids and have encouraged her to drink more.     Assessment & Plan:   Principal Problem:   Vomiting due to large hiatal hernia and ? GOO - poor candidate for surgery and not interested in surgery either- started clears - will advance as tolerated -will need to resume IVF today - have encouraged her to drink - resumed Protonix which she takes at home - GI consulted today- at this time, as she is tolerating clears, no further work up is recommended  Active Problems:   Essential hypertension -resume Metoprolol -resume Cozaar  today    Atrial fibrillation - resumed Coumadin and Metoprolol    History of CVA (cerebrovascular accident)    Diastolic CHF (HCC) - hold lasix for now- barely drinking  Time spent in minutes: 35 DVT prophylaxis: SCDs until INR therapeutic Code Status: Full code Family Communication: son  Disposition Plan: home  Consultants:   gen surgery Procedures:   none Antimicrobials:  Anti-infectives (From admission, onward)   None       Objective: Vitals:   06/18/18 0420 06/18/18 0500 06/18/18 0700 06/18/18 1400  BP: (!) 144/78   (!) 199/93  Pulse: 74   74  Resp: 20   16  Temp: 98.3 F (36.8 C)   97.7 F (36.5 C)  TempSrc: Oral   Oral  SpO2: 94%   95%  Weight:  52.4 kg 51.8 kg   Height:        Intake/Output  Summary (Last 24 hours) at 06/18/2018 1605 Last data filed at 06/18/2018 1222 Gross per 24 hour  Intake 360 ml  Output 250 ml  Net 110 ml   Filed Weights   06/17/18 0356 06/18/18 0500 06/18/18 0700  Weight: 51.3 kg 52.4 kg 51.8 kg    Examination: General exam: Appears comfortable  HEENT: PERRLA, oral mucosa moist, no sclera icterus or thrush Respiratory system: Clear to auscultation. Respiratory effort normal. Cardiovascular system: S1 & S2 heard,  No murmurs  Gastrointestinal system: Abdomen soft, non-tender, nondistended. Normal bowel sound. No organomegaly Central nervous system: Alert and oriented. No focal neurological deficits. Extremities: No cyanosis, clubbing or edema Skin: No rashes or ulcers Psychiatry:  Mood & affect appropriate.    Data Reviewed: I have personally reviewed following labs and imaging studies  CBC: Recent Labs  Lab 06/16/18 0135 06/16/18 0157 06/16/18 0411  WBC 9.5  --  9.7  NEUTROABS 7.6  --   --   HGB 14.8 15.0 14.5  HCT 46.0 44.0 45.3  MCV 93.1  --  92.3  PLT 231  --  204   Basic Metabolic Panel: Recent Labs  Lab 06/16/18 0135 06/16/18 0157 06/16/18 0411  NA 142 140 141  K 3.8 3.6 4.0  CL 103 102 103  CO2 25  --  27  GLUCOSE 161* 157* 147*  BUN  11 14 12   CREATININE 0.76 0.70 0.87  CALCIUM 9.2  --  8.9   GFR: Estimated Creatinine Clearance: 33.3 mL/min (by C-G formula based on SCr of 0.87 mg/dL). Liver Function Tests: Recent Labs  Lab 06/16/18 0135  AST 25  ALT 22  ALKPHOS 122  BILITOT 0.9  PROT 7.2  ALBUMIN 3.9   Recent Labs  Lab 06/16/18 0135  LIPASE 41   No results for input(s): AMMONIA in the last 168 hours. Coagulation Profile: Recent Labs  Lab 06/16/18 0135 06/16/18 0411 06/18/18 0531  INR 1.94 1.83 1.84   Cardiac Enzymes: No results for input(s): CKTOTAL, CKMB, CKMBINDEX, TROPONINI in the last 168 hours. BNP (last 3 results) No results for input(s): PROBNP in the last 8760 hours. HbA1C: No results  for input(s): HGBA1C in the last 72 hours. CBG: No results for input(s): GLUCAP in the last 168 hours. Lipid Profile: No results for input(s): CHOL, HDL, LDLCALC, TRIG, CHOLHDL, LDLDIRECT in the last 72 hours. Thyroid Function Tests: No results for input(s): TSH, T4TOTAL, FREET4, T3FREE, THYROIDAB in the last 72 hours. Anemia Panel: No results for input(s): VITAMINB12, FOLATE, FERRITIN, TIBC, IRON, RETICCTPCT in the last 72 hours. Urine analysis:    Component Value Date/Time   COLORURINE YELLOW 06/16/2018 0843   APPEARANCEUR CLEAR 06/16/2018 0843   LABSPEC 1.014 06/16/2018 0843   PHURINE 6.0 06/16/2018 0843   GLUCOSEU NEGATIVE 06/16/2018 0843   HGBUR SMALL (A) 06/16/2018 0843   BILIRUBINUR NEGATIVE 06/16/2018 0843   KETONESUR 5 (A) 06/16/2018 0843   PROTEINUR NEGATIVE 06/16/2018 0843   UROBILINOGEN 1.0 04/02/2015 1244   NITRITE NEGATIVE 06/16/2018 0843   LEUKOCYTESUR NEGATIVE 06/16/2018 0843   Sepsis Labs: @LABRCNTIP (procalcitonin:4,lacticidven:4) )No results found for this or any previous visit (from the past 240 hour(s)).       Radiology Studies: No results found.    Scheduled Meds: . feeding supplement  1 Container Oral BID BM  . metoprolol tartrate  12.5 mg Oral BID  . pantoprazole  40 mg Oral Daily  . QUEtiapine  12.5 mg Oral QHS  . warfarin  3 mg Oral ONCE-1800  . Warfarin - Pharmacist Dosing Inpatient   Does not apply q1800   Continuous Infusions: . sodium chloride 75 mL/hr at 06/18/18 1311  . famotidine (PEPCID) IV       LOS: 2 days      Calvert Cantor, MD [facility] Pager: [email or web address] Password [facility] 06/18/2018, 4:05 PM

## 2018-06-18 NOTE — Progress Notes (Signed)
Initial Nutrition Assessment  DOCUMENTATION CODES:   Severe malnutrition in context of chronic illness  INTERVENTION:   - Boost Breeze po TID, each supplement provides 250 kcal and 9 grams of protein - Reviewed menu with patient and encouraged her to pick liquids she enjoys.  NUTRITION DIAGNOSIS:   Severe Malnutrition related to chronic illness(hiatal hernia) as evidenced by severe fat depletion, severe muscle depletion, energy intake < or equal to 75% for > or equal to 1 month.  GOAL:   Patient will meet greater than or equal to 90% of their needs  MONITOR:   Weight trends, Labs, I & O's, Skin, Supplement acceptance, PO intake  REASON FOR ASSESSMENT:   Malnutrition Screening Tool    ASSESSMENT:   Pt with PMH of GERD, anxiety, HTN, IBS, CKD stage II, DVT, and chronic hiatal hernia currently causing gastric outlet obstruction. Pt lives in assisted living. Presented to ED with persistent N/V and abdominal pain.    Pt was awake and sitting in bedside chair. Alert and able to answer questions. Pt enjoys drinking Boost Breeze; observed that she drank ~2/3.   Pt enjoys some of the clear liquids but said she has only taken a few sips of her meals. PTA, she mentioned that breakfast is typically the smallest meal which consist of cereal and coffee. Lunch is either leftovers or a sandwich (peanut butter, mayo and banana) with sweet tea. She goes to the dining hall for dinner which is often a meat (chicken, steak) with vegetables (potatoes, carrots) and sweet tea.   Pt said she was walking earlier and felt fine. Pt also reports that she is able to complete ADL without difficulty at the ALF ([facility]) and enjoys walking there too. No N/V 1/21. Pt said on Saturday (1/18) she was having problems swallowing foods, but reports no issues with chewing or taste changes. She has noticed wt loss; pants are not fitting as well and reported UBW 120#.   Per surgery note, they do not plan on  performing surgery on the hiatal hernia. Also noted plans to advance diet as tolerated.   Tolerating Boost Breeze well. RD recommended increasing Boost Breeze to TID; pt was receptive and willing to try to increase intake. RD also reviewed menu options and encouraged pt to pick liquids she prefers.   Medications reviewed and include: Pantoprazole Labs reviewed    NUTRITION - FOCUSED PHYSICAL EXAM:    Most Recent Value  Orbital Region  Severe depletion  Upper Arm Region  Severe depletion  Thoracic and Lumbar Region  Mild depletion  Buccal Region  Severe depletion  Temple Region  Severe depletion  Clavicle Bone Region  Severe depletion  Clavicle and Acromion Bone Region  Severe depletion  Scapular Bone Region  Severe depletion  Dorsal Hand  Severe depletion  Patellar Region  Severe depletion  Anterior Thigh Region  Severe depletion  Posterior Calf Region  Severe depletion  Edema (RD Assessment)  None  Hair  Reviewed  Eyes  Reviewed  Mouth  Reviewed  Skin  Reviewed  Nails  Reviewed     Diet Order:   Diet Order            Diet clear liquid Room service appropriate? Yes; Fluid consistency: Thin  Diet effective now              EDUCATION NEEDS:   No education needs have been identified at this time  Skin:  Skin Assessment: Reviewed RN Assessment  Last BM:  1/20  Height:   Ht Readings from Last 1 Encounters:  06/16/18 5\' 2"  (1.575 m)    Weight:   Wt Readings from Last 1 Encounters:  06/18/18 51.8 kg    Ideal Body Weight:  50 kg  BMI:  Body mass index is 20.89 kg/m.  Estimated Nutritional Needs:   Kcal:  1400-1600  Protein:  70-80g  Fluid:  1.4-1.6L    [employer] Dietetic Intern

## 2018-06-18 NOTE — Social Work (Signed)
CSW aware pt from [facility], plan for return with home health.  CSW signing off. Please consult if any additional needs arise.  Doy HutchingIsabel H Emmalou Hunger, LCSWA [facility] Clinical Social Work [phone]

## 2018-06-19 LAB — PROTIME-INR
INR: 2.34
Prothrombin Time: 25.3 seconds — ABNORMAL HIGH (ref 11.4–15.2)

## 2018-06-19 LAB — GLUCOSE, CAPILLARY
Glucose-Capillary: 102 mg/dL — ABNORMAL HIGH (ref 70–99)
Glucose-Capillary: 96 mg/dL (ref 70–99)

## 2018-06-19 MED ORDER — ENSURE ENLIVE PO LIQD
237.0000 mL | Freq: Two times a day (BID) | ORAL | Status: DC
  Administered 2018-06-19 – 2018-06-20 (×2): 237 mL via ORAL

## 2018-06-19 MED ORDER — WARFARIN SODIUM 2 MG PO TABS
2.0000 mg | ORAL_TABLET | Freq: Once | ORAL | Status: AC
  Administered 2018-06-19: 2 mg via ORAL
  Filled 2018-06-19: qty 1

## 2018-06-19 NOTE — Care Management Important Message (Signed)
Important Message  Patient Details  Name: Lisa Hopkins MRN: [id number] Date of Birth: [birth year]   Medicare Important Message Given:  Yes    Dorena Bodo 06/19/2018, 2:17 PM

## 2018-06-19 NOTE — Progress Notes (Signed)
ANTICOAGULATION CONSULT NOTE  Pharmacy Consult:  Coumadin Indication: atrial fibrillation  Allergies  Allergen Reactions  . Ace Inhibitors Other (See Comments)    Cough  . Fosamax [Alendronate Sodium] Other (See Comments)    Heart burn  . Norvasc [Amlodipine Besylate] Other (See Comments)    edema  . Zocor [Simvastatin] Other (See Comments)    Elevated CPK    Patient Measurements: Height: 5\' 2"  (157.5 cm) Weight: 115 lb 15.4 oz (52.6 kg) IBW/kg (Calculated) : 50.1  Vital Signs: Temp: 97.7 F (36.5 C) (01/22 1316) Temp Source: Oral (01/22 1316) BP: 146/87 (01/22 1316) Pulse Rate: 61 (01/22 1316)  Labs: Recent Labs    06/18/18 0531 06/19/18 0244  LABPROT 21.1* 25.3*  INR 1.84 2.34    Estimated Creatinine Clearance: 33.3 mL/min (by C-G formula based on SCr of 0.87 mg/dL).   Assessment: 19 YOF presented with nausea, vomiting and abdominal pain.  Patient has hiatal hernia and decided against surgery.  Pharmacy consulted to continue Coumadin from PTA for history of Afib.  INR therapeutic; no bleeding reported.  Home Coumadin dose: 2mg  PO daily except 3mg  on Mon and Fri   Goal of Therapy:  INR 2-3 Monitor platelets by anticoagulation protocol: Yes   Plan:  Coumadin 2mg  PO today Daily PT / INR   Malerie Eakins D. Laney Potash, PharmD, BCPS, BCCCP 06/19/2018, 1:35 PM

## 2018-06-19 NOTE — Progress Notes (Addendum)
Nutrition Follow-up  DOCUMENTATION CODES:   Severe malnutrition in context of chronic illness  INTERVENTION:  -Ensure Enlive po BID, each supplement provides 350 kcal and 20 grams of protein -Encouraged pt and family to pick foods / liquids that pt will tolerate well and enjoy; notified Food service department of preferences. -Magic cup TID with meals, each supplement provides 290 kcal and 9 grams of protein -D/C Boost Breeze  NUTRITION DIAGNOSIS:   Severe Malnutrition related to chronic illness(hiatal hernia) as evidenced by severe fat depletion, severe muscle depletion, energy intake < or equal to 75% for > or equal to 1 month.  GOAL:   Patient will meet greater than or equal to 90% of their needs  MONITOR:   Weight trends, Labs, I & O's, Skin, Supplement acceptance, PO intake  REASON FOR ASSESSMENT:   Malnutrition Screening Tool    ASSESSMENT:   Pt with PMH of GERD, anxiety, HTN, IBS, CKD stage II, DVT, and chronic hiatal hernia currently causing gastric outlet obstruction. Pt lives in assisted living. Presented to ED with persistent N/V and abdominal pain.    Pt was awake and one of her sons were present. Pt is enjoying being on full liquids and just recently had breakfast; ate >50% of her meal. She denies any nausea after eating and reports she is still tolerating the Boost Breeze well.   Son and pt both reported that she was able to drink more of the Boost but was not able to fully reach TID. Recommended Ensure Enlive; both pt and son were receptive and happy to begin the supplement. Pt has experience with Ensure and reported that she was able to tolerate it well before.   Pt mentioned that she enjoys ice cream; recommended magic cup. Pt is willing to try. Reviewed with pt and son that by including these supplements we can help increase energy and protein intake. Both were appreciative of the information.   Medications reviewed and include: Pantoprazole 40mg  Labs  reviewed     Diet Order:   Diet Order            Diet full liquid Room service appropriate? Yes; Fluid consistency: Thin  Diet effective now              EDUCATION NEEDS:   No education needs have been identified at this time  Skin:  Skin Assessment: Reviewed RN Assessment  Last BM:  1/22  Height:   Ht Readings from Last 1 Encounters:  06/16/18 5\' 2"  (1.575 m)    Weight:   Wt Readings from Last 1 Encounters:  06/19/18 52.6 kg    Ideal Body Weight:  50 kg  BMI:  Body mass index is 21.21 kg/m.  Estimated Nutritional Needs:   Kcal:  1400-1600  Protein:  70-80g  Fluid:  1.4-1.6L    [employer] Dietetic Intern

## 2018-06-19 NOTE — Progress Notes (Signed)
PROGRESS NOTE    Lisa Hopkins   ACZ:[id number]  DOB: 10-Dec-1924  DOA: 06/16/2018 PCP: Lucky Cowboy, MD   Brief Narrative:  Lisa Hopkins  a 85 y.o. female with medical history significant of hiatal hernia causing small bowel obstruction gastric outlet obstruction in the past, A. fib, chronic kidney disease lives in assisted living comes in with over 6 hours of persistent nausea vomiting and abdominal pain generalized at her assisted living.    Subjective: Drinking a little more now.   Assessment & Plan:   Principal Problem:   Vomiting due to large hiatal hernia and ? GOO - poor candidate for surgery and not interested in surgery either- started clears - will advance as tolerated - resumed Protonix which she takes at home - GI consulted - at this time, as she is tolerating clears, no further work up is recommended - advance to pureed diet today and follow for tolerance  Active Problems:   Essential hypertension -resume Metoprolol -resumed Cozaar      Atrial fibrillation - resumed Coumadin and Metoprolol    History of CVA (cerebrovascular accident)    Diastolic CHF (HCC) - hold lasix for now- barely drinking  Time spent in minutes: 35 DVT prophylaxis:  INR therapeutic Code Status: Full code Family Communication: son  Disposition Plan: home  Consultants:   gen surgery Procedures:   none Antimicrobials:  Anti-infectives (From admission, onward)   None       Objective: Vitals:   06/18/18 2016 06/19/18 0357 06/19/18 0457 06/19/18 1316  BP: (!) 156/105 140/60  (!) 146/87  Pulse: 82 65  61  Resp: 20 18  17   Temp: 98.4 F (36.9 C) (!) 97.5 F (36.4 C)  97.7 F (36.5 C)  TempSrc: Oral Oral  Oral  SpO2: 96% 96%  97%  Weight:   52.6 kg   Height:        Intake/Output Summary (Last 24 hours) at 06/19/2018 1602 Last data filed at 06/19/2018 1529 Gross per 24 hour  Intake 1812.67 ml  Output -  Net 1812.67 ml   Filed Weights   06/18/18 0500  06/18/18 0700 06/19/18 0457  Weight: 52.4 kg 51.8 kg 52.6 kg    Examination: General exam: Appears comfortable  HEENT: PERRLA, oral mucosa moist, no sclera icterus or thrush Respiratory system: Clear to auscultation. Respiratory effort normal. Cardiovascular system: S1 & S2 heard,  No murmurs  Gastrointestinal system: Abdomen soft, non-tender, nondistended. Normal bowel sound. No organomegaly Central nervous system: Alert and oriented. No focal neurological deficits. Extremities: No cyanosis, clubbing or edema Skin: No rashes or ulcers Psychiatry:  Mood & affect appropriate.    Data Reviewed: I have personally reviewed following labs and imaging studies  CBC: Recent Labs  Lab 06/16/18 0135 06/16/18 0157 06/16/18 0411  WBC 9.5  --  9.7  NEUTROABS 7.6  --   --   HGB 14.8 15.0 14.5  HCT 46.0 44.0 45.3  MCV 93.1  --  92.3  PLT 231  --  204   Basic Metabolic Panel: Recent Labs  Lab 06/16/18 0135 06/16/18 0157 06/16/18 0411  NA 142 140 141  K 3.8 3.6 4.0  CL 103 102 103  CO2 25  --  27  GLUCOSE 161* 157* 147*  BUN 11 14 12   CREATININE 0.76 0.70 0.87  CALCIUM 9.2  --  8.9   GFR: Estimated Creatinine Clearance: 33.3 mL/min (by C-G formula based on SCr of 0.87 mg/dL). Liver Function Tests: Recent Labs  Lab 06/16/18 0135  AST 25  ALT 22  ALKPHOS 122  BILITOT 0.9  PROT 7.2  ALBUMIN 3.9   Recent Labs  Lab 06/16/18 0135  LIPASE 41   No results for input(s): AMMONIA in the last 168 hours. Coagulation Profile: Recent Labs  Lab 06/16/18 0135 06/16/18 0411 06/18/18 0531 06/19/18 0244  INR 1.94 1.83 1.84 2.34   Cardiac Enzymes: No results for input(s): CKTOTAL, CKMB, CKMBINDEX, TROPONINI in the last 168 hours. BNP (last 3 results) No results for input(s): PROBNP in the last 8760 hours. HbA1C: No results for input(s): HGBA1C in the last 72 hours. CBG: Recent Labs  Lab 06/18/18 2014 06/19/18 0011 06/19/18 0354  GLUCAP 104* 96 102*   Lipid  Profile: No results for input(s): CHOL, HDL, LDLCALC, TRIG, CHOLHDL, LDLDIRECT in the last 72 hours. Thyroid Function Tests: No results for input(s): TSH, T4TOTAL, FREET4, T3FREE, THYROIDAB in the last 72 hours. Anemia Panel: No results for input(s): VITAMINB12, FOLATE, FERRITIN, TIBC, IRON, RETICCTPCT in the last 72 hours. Urine analysis:    Component Value Date/Time   COLORURINE YELLOW 06/16/2018 0843   APPEARANCEUR CLEAR 06/16/2018 0843   LABSPEC 1.014 06/16/2018 0843   PHURINE 6.0 06/16/2018 0843   GLUCOSEU NEGATIVE 06/16/2018 0843   HGBUR SMALL (A) 06/16/2018 0843   BILIRUBINUR NEGATIVE 06/16/2018 0843   KETONESUR 5 (A) 06/16/2018 0843   PROTEINUR NEGATIVE 06/16/2018 0843   UROBILINOGEN 1.0 04/02/2015 1244   NITRITE NEGATIVE 06/16/2018 0843   LEUKOCYTESUR NEGATIVE 06/16/2018 0843   Sepsis Labs: @LABRCNTIP (procalcitonin:4,lacticidven:4) )No results found for this or any previous visit (from the past 240 hour(s)).       Radiology Studies: No results found.    Scheduled Meds: . feeding supplement (ENSURE ENLIVE)  237 mL Oral BID BM  . losartan  50 mg Oral Q24H  . metoprolol tartrate  12.5 mg Oral BID  . pantoprazole  40 mg Oral Daily  . QUEtiapine  12.5 mg Oral QHS  . warfarin  2 mg Oral ONCE-1800  . Warfarin - Pharmacist Dosing Inpatient   Does not apply q1800   Continuous Infusions:    LOS: 3 days      Calvert Cantor, MD [facility] Pager: [email or web address] Password [facility] 06/19/2018, 4:02 PM

## 2018-06-19 NOTE — Plan of Care (Signed)
  Problem: Clinical Measurements: Goal: Will remain free from infection Outcome: Progressing Goal: Respiratory complications will improve Outcome: Progressing   Problem: Activity: Goal: Risk for activity intolerance will decrease Outcome: Progressing   Problem: Nutrition: Goal: Adequate nutrition will be maintained Outcome: Progressing   Problem: Coping: Goal: Level of anxiety will decrease Outcome: Progressing   Problem: Elimination: Goal: Will not experience complications related to bowel motility Outcome: Progressing Goal: Will not experience complications related to urinary retention Outcome: Progressing   Problem: Pain Managment: Goal: General experience of comfort will improve Outcome: Progressing   Problem: Safety: Goal: Ability to remain free from injury will improve Outcome: Progressing   Problem: Skin Integrity: Goal: Risk for impaired skin integrity will decrease Outcome: Progressing

## 2018-06-19 NOTE — Plan of Care (Signed)

## 2018-06-20 DIAGNOSIS — R112 Nausea with vomiting, unspecified: Secondary | ICD-10-CM

## 2018-06-20 DIAGNOSIS — E43 Unspecified severe protein-calorie malnutrition: Secondary | ICD-10-CM

## 2018-06-20 LAB — PROTIME-INR
INR: 2.65
Prothrombin Time: 27.9 seconds — ABNORMAL HIGH (ref 11.4–15.2)

## 2018-06-20 MED ORDER — ENSURE ENLIVE PO LIQD: 237.0000 mL | Freq: Two times a day (BID) | ORAL | 12 refills | Status: DC

## 2018-06-20 MED ORDER — WARFARIN SODIUM 2 MG PO TABS
2.0000 mg | ORAL_TABLET | Freq: Once | ORAL | Status: DC
  Filled 2018-06-20: qty 1

## 2018-06-20 MED ORDER — WHITE PETROLATUM EX OINT
TOPICAL_OINTMENT | CUTANEOUS | Status: AC
  Administered 2018-06-20: 0.2
  Filled 2018-06-20: qty 28.35

## 2018-06-20 NOTE — Discharge Summary (Addendum)
Physician Discharge Summary  Lisa Hopkins ZOX:[id number] DOB: [birth year] DOA: 06/16/2018  PCP: Lucky Cowboy, MD  Admit date: 06/16/2018 Discharge date: 06/20/2018  Admitted From: ALF  Disposition:  ALF   Recommendations for Outpatient Follow-up:  Continue medical management of large hiatal hernia INR in 1 wk  Discharge Condition:  stable   CODE STATUS:  Full code   Diet recommendation:  pureed consistencies down grade to liquids temporarily if she has vomting Consultations:  General surgery  GI   Discharge Diagnoses:  Principal Problem: Vomiting secondary to large hiatal hernia Active Problems:   Essential hypertension   Atrial fibrillation (HCC)   History of CVA (cerebrovascular accident)   Diastolic CHF (HCC)   CKD stage 2 due to type 2 diabetes mellitus (HCC)   Protein-calorie malnutrition, severe   Brief Summary: Lisa Hopkins a 85 y.o.femalewith medical history significant ofhiatal hernia causing small bowel obstruction gastric outlet obstruction in the past, A. fib, chronic kidney disease lives in assisted living comes in with over 6 hours of persistent nausea vomiting and abdominal pain generalized at her assisted living.   Hospital Course:  Principal Problem:   Vomiting due to large hiatal hernia and ? GOO - as noted in imaging most of her stomach is in her thorax with possible outlet obstruction - poor candidate for surgery and also not  interested in surgery  -  General surgery signed off - GI consulted -  as she was tolerating clears, no further work up is recommended - steadily advanced to pureed diet - she remains free of symptoms - have discussed the need to choose food consistencies that she will continue to tolerate   Active Problems:  Severe protein calorie malnutrition - Ensure added    Essential hypertension -resume Metoprolol and Cozaar      Atrial fibrillation - resumed Coumadin and Metoprolol - INR therapeutic      History of CVA (cerebrovascular accident)    chronic Diastolic CHF ? - diastolic function could not be measured on last ECHO- she is on Lasix as outpt     Discharge Exam: Vitals:   06/19/18 2130 06/20/18 0445  BP: (!) 164/77 140/76  Pulse: 68 70  Resp: 16 17  Temp: 98.1 F (36.7 C) 98.2 F (36.8 C)  SpO2: 95% 97%   Vitals:   06/19/18 1316 06/19/18 2130 06/20/18 0409 06/20/18 0445  BP: (!) 146/87 (!) 164/77  140/76  Pulse: 61 68  70  Resp: 17 16  17   Temp: 97.7 F (36.5 C) 98.1 F (36.7 C)  98.2 F (36.8 C)  TempSrc: Oral Oral  Oral  SpO2: 97% 95%  97%  Weight:   52.3 kg   Height:        General: Pt is alert, awake, not in acute distress Cardiovascular: RRR, S1/S2 +, no rubs, no gallops Respiratory: CTA bilaterally, no wheezing, no rhonchi Abdominal: Soft, NT, ND, bowel sounds + Extremities: no edema, no cyanosis   Discharge Instructions  Discharge Instructions    Increase activity slowly   Complete by:  As directed      Allergies as of 06/20/2018      Reactions   Ace Inhibitors Other (See Comments)   Cough   Fosamax [alendronate Sodium] Other (See Comments)   Heart burn   Norvasc [amlodipine Besylate] Other (See Comments)   edema   Zocor [simvastatin] Other (See Comments)   Elevated CPK      Medication List    TAKE these medications  cholestyramine light 4 GM/DOSE powder Commonly known as:  PREVALITE Take 1 scoop ( 4 gm ) in a glass of Water or Juice 2 to 3 x  /day as needed for Diarrhea   CINNAMON PO Take 1 capsule by mouth daily.   dicyclomine 20 MG tablet Commonly known as:  BENTYL Take 1 tablet 3 x day if needed for nausea, bloating, cramping or diarrhea What changed:    how much to take  how to take this  when to take this  additional instructions   feeding supplement (ENSURE ENLIVE) Liqd Take 237 mLs by mouth 2 (two) times daily between meals.   furosemide 20 MG tablet Commonly known as:  LASIX TAKE 1 TABLET EACH DAY.    losartan 50 MG tablet Commonly known as:  COZAAR TAKE 1/2 TO 1 TABLET ONCE A DAY. What changed:  See the new instructions.   MAGNESIA PO Take 250 mg by mouth daily.   metoprolol tartrate 25 MG tablet Commonly known as:  LOPRESSOR Take 0.5 tablets (12.5 mg total) by mouth 2 (two) times daily.   ondansetron 4 MG disintegrating tablet Commonly known as:  ZOFRAN ODT Dissolve 1 tablet under tongue every 6 to 8 hours if needed for nausea & / or vomitting   pantoprazole 40 MG tablet Commonly known as:  PROTONIX Take 1 tablet daily for Heartburn & Acid Indigestion   potassium chloride 10 MEQ tablet Commonly known as:  K-DUR TAKE 1 TABLET BY MOUTH DAILY.   QUEtiapine 25 MG tablet Commonly known as:  SEROQUEL Take 0.5 tablets (12.5 mg total) by mouth at bedtime.   ranitidine 300 MG tablet Commonly known as:  ZANTAC Take 300 mg by mouth daily.   Vitamin D 50 MCG (2000 UT) Caps Take 5,000 Units by mouth daily.   warfarin 2 MG tablet Commonly known as:  COUMADIN Take as directed. If you are unsure how to take this medication, talk to your nurse or doctor. Original instructions:  TAKE AS DIRECTED BY ANTICOAGULATION CLINIC. What changed:  See the new instructions.      Follow-up Information    [facility] Follow up.   Specialty:  Home Health Services Why:  Home health  Contact information: [address] [place] [address] [place] [address] [phone]          Allergies  Allergen Reactions  . Ace Inhibitors Other (See Comments)    Cough  . Fosamax [Alendronate Sodium] Other (See Comments)    Heart burn  . Norvasc [Amlodipine Besylate] Other (See Comments)    edema  . Zocor [Simvastatin] Other (See Comments)    Elevated CPK     Procedures/Studies:    Dg Abd 1 View  Result Date: 06/16/2018 CLINICAL DATA:  Evaluate nasogastric tube placement. EXAM: ABDOMEN - 1 VIEW COMPARISON:  CT 06/16/2018 FINDINGS: Tip of the nasogastric tube is in the lower chest and may  be near the GE junction. Patient has a very large hiatal hernia containing gas. NG tube side hole in the mid esophagus at the level of the carina. IMPRESSION: Nasogastric tube tip in the lower chest probably near the GE junction. Large hiatal hernia containing gas. Electronically Signed   By: Richarda Overlie M.D.   On: 06/16/2018 12:44   Ct Renal Stone Study  Result Date: 06/16/2018 CLINICAL DATA:  85 year old female with abdominal pain, nausea vomiting and diarrhea. EXAM: CT ABDOMEN AND PELVIS WITHOUT CONTRAST TECHNIQUE: Multidetector CT imaging of the abdomen and pelvis was performed following  the standard protocol without IV contrast. COMPARISON:  CT of the abdomen pelvis dated 03/16/2018 FINDINGS: Evaluation of this exam is limited in the absence of intravenous contrast. Lower chest: Large hiatal hernia with the majority of the stomach located lower chest. Associated atelectatic changes of the left lung base. There is coronary vascular calcification. No intra-abdominal free air or free fluid. Hepatobiliary: The liver is unremarkable. No intrahepatic biliary ductal dilatation. Cholecystectomy. Pancreas: Unremarkable. No pancreatic ductal dilatation or surrounding inflammatory changes. Spleen: Normal in size without focal abnormality. Adrenals/Urinary Tract: Adrenal glands are unremarkable. Kidneys are normal, without renal calculi, focal lesion, or hydronephrosis. Bladder is unremarkable. Stomach/Bowel: There is a large hiatal hernia containing the majority of the stomach with apparent organo-axial volvulus similar to prior CT. There is distention of the stomach with air and liquid content, likely representing a degree of gastric outlet obstruction. There is no small bowel obstruction or active inflammation. There is sigmoid diverticulosis without active inflammatory changes. The appendix is not visualized with certainty. No inflammatory changes identified in the right lower quadrant. Vascular/Lymphatic: Moderate  aortoiliac atherosclerotic disease. No portal venous gas. There is no adenopathy. Reproductive: Small uterus with a calcified fundal fibroid. No adnexal masses. Other: None Musculoskeletal: Osteopenia with degenerative changes of the spine. No acute osseous pathology. IMPRESSION: 1. Large hiatal hernia containing the majority of the stomach with possible partial gastric outlet obstruction. This finding is similar to prior studies. 2. Sigmoid diverticulosis. No small bowel obstruction or active inflammation. 3. No hydronephrosis or nephrolithiasis. Electronically Signed   By: Elgie Collard M.D.   On: 06/16/2018 01:47     The results of significant diagnostics from this hospitalization (including imaging, microbiology, ancillary and laboratory) are listed below for reference.     Microbiology: No results found for this or any previous visit (from the past 240 hour(s)).   Labs: BNP (last 3 results) No results for input(s): BNP in the last 8760 hours. Basic Metabolic Panel: Recent Labs  Lab 06/16/18 0135 06/16/18 0157 06/16/18 0411  NA 142 140 141  K 3.8 3.6 4.0  CL 103 102 103  CO2 25  --  27  GLUCOSE 161* 157* 147*  BUN 11 14 12   CREATININE 0.76 0.70 0.87  CALCIUM 9.2  --  8.9   Liver Function Tests: Recent Labs  Lab 06/16/18 0135  AST 25  ALT 22  ALKPHOS 122  BILITOT 0.9  PROT 7.2  ALBUMIN 3.9   Recent Labs  Lab 06/16/18 0135  LIPASE 41   No results for input(s): AMMONIA in the last 168 hours. CBC: Recent Labs  Lab 06/16/18 0135 06/16/18 0157 06/16/18 0411  WBC 9.5  --  9.7  NEUTROABS 7.6  --   --   HGB 14.8 15.0 14.5  HCT 46.0 44.0 45.3  MCV 93.1  --  92.3  PLT 231  --  204   Cardiac Enzymes: No results for input(s): CKTOTAL, CKMB, CKMBINDEX, TROPONINI in the last 168 hours. BNP: Invalid input(s): POCBNP CBG: Recent Labs  Lab 06/18/18 2014 06/19/18 0011 06/19/18 0354  GLUCAP 104* 96 102*   D-Dimer No results for input(s): DDIMER in the last 72  hours. Hgb A1c No results for input(s): HGBA1C in the last 72 hours. Lipid Profile No results for input(s): CHOL, HDL, LDLCALC, TRIG, CHOLHDL, LDLDIRECT in the last 72 hours. Thyroid function studies No results for input(s): TSH, T4TOTAL, T3FREE, THYROIDAB in the last 72 hours.  Invalid input(s): FREET3 Anemia work up No results for input(s):  VITAMINB12, FOLATE, FERRITIN, TIBC, IRON, RETICCTPCT in the last 72 hours. Urinalysis    Component Value Date/Time   COLORURINE YELLOW 06/16/2018 0843   APPEARANCEUR CLEAR 06/16/2018 0843   LABSPEC 1.014 06/16/2018 0843   PHURINE 6.0 06/16/2018 0843   GLUCOSEU NEGATIVE 06/16/2018 0843   HGBUR SMALL (A) 06/16/2018 0843   BILIRUBINUR NEGATIVE 06/16/2018 0843   KETONESUR 5 (A) 06/16/2018 0843   PROTEINUR NEGATIVE 06/16/2018 0843   UROBILINOGEN 1.0 04/02/2015 1244   NITRITE NEGATIVE 06/16/2018 0843   LEUKOCYTESUR NEGATIVE 06/16/2018 0843   Sepsis Labs Invalid input(s): PROCALCITONIN,  WBC,  LACTICIDVEN Microbiology No results found for this or any previous visit (from the past 240 hour(s)).   Time coordinating discharge in minutes: 65  SIGNED:   Calvert Cantor, MD  [facility] 06/20/2018, 8:13 AM Pager   If 7PM-7AM, please contact night-coverage [email or web address] Password [email or web address]

## 2018-06-20 NOTE — Progress Notes (Signed)
ANTICOAGULATION CONSULT NOTE  Pharmacy Consult:  Coumadin Indication: atrial fibrillation  Allergies  Allergen Reactions  . Ace Inhibitors Other (See Comments)    Cough  . Fosamax [Alendronate Sodium] Other (See Comments)    Heart burn  . Norvasc [Amlodipine Besylate] Other (See Comments)    edema  . Zocor [Simvastatin] Other (See Comments)    Elevated CPK    Patient Measurements: Height: 5\' 2"  (157.5 cm) Weight: 115 lb 4.8 oz (52.3 kg) IBW/kg (Calculated) : 50.1  Vital Signs: Temp: 98.2 F (36.8 C) (01/23 0445) Temp Source: Oral (01/23 0445) BP: 138/80 (01/23 1002) Pulse Rate: 72 (01/23 1002)  Labs: Recent Labs    06/18/18 0531 06/19/18 0244 06/20/18 0215  LABPROT 21.1* 25.3* 27.9*  INR 1.84 2.34 2.65    Estimated Creatinine Clearance: 33.3 mL/min (by C-G formula based on SCr of 0.87 mg/dL).   Assessment: 70 YOF presented with nausea, vomiting and abdominal pain.  Patient has hiatal hernia and decided against surgery.  Pharmacy consulted to continue Coumadin from [facility] for history of Afib.  INR therapeutic; no bleeding reported.  Home Coumadin dose: 2mg  PO daily except 3mg  on Mon and Fri   Goal of Therapy:  INR 2-3 Monitor platelets by anticoagulation protocol: Yes   Plan:  Repeat Coumadin 2mg  PO today if still here Daily PT / INR   Jeweliana Dudgeon D. Laney Potash, PharmD, BCPS, BCCCP 06/20/2018, 10:56 AM

## 2018-06-20 NOTE — Progress Notes (Signed)
Discharge  Pt was able to participate with discharge teaching with son at the bedside. Pt was informed of medication changes and all follow up appts. PIV was removed by NA and pt dressed. Pt will transport home via son. Wheelchair ordered for discharge.

## 2018-06-25 ENCOUNTER — Ambulatory Visit (INDEPENDENT_AMBULATORY_CARE_PROVIDER_SITE_OTHER): Payer: Medicare Other | Admitting: Cardiovascular Disease

## 2018-06-25 DIAGNOSIS — Z0001 Encounter for general adult medical examination with abnormal findings: Secondary | ICD-10-CM

## 2018-06-25 DIAGNOSIS — I4891 Unspecified atrial fibrillation: Secondary | ICD-10-CM

## 2018-06-25 DIAGNOSIS — Z8673 Personal history of transient ischemic attack (TIA), and cerebral infarction without residual deficits: Secondary | ICD-10-CM | POA: Diagnosis not present

## 2018-06-25 LAB — POCT INR: INR: 1.9 — AB (ref 2.0–3.0)

## 2018-06-25 NOTE — Patient Instructions (Signed)
  Description   Spoke with Lum BabeKendrice [facility] RN while in the home with pt and advised pt to take 3mg  today then continue taking 2mg  daily except 3mg  on Mondays and Fridays. Recheck INR in 1 week.  Call us at [facility] # [phone], Main # [phone].

## 2018-06-27 ENCOUNTER — Other Ambulatory Visit: Payer: Self-pay | Admitting: Cardiology

## 2018-07-01 ENCOUNTER — Telehealth: Payer: Self-pay | Admitting: *Deleted

## 2018-07-01 NOTE — Progress Notes (Deleted)
Hospital follow up  Assessment and Plan: Hospital visit follow up for: emesis secondary to SBO and large hiatal hernia  All medications were reviewed with patient and family and fully reconciled. All questions answered fully, and patient and family members were encouraged to call the office with any further questions or concerns. Discussed goal to avoid readmission related to this diagnosis.  There are no discontinued medications.  CAN NOT DO FOR BCBS REGULAR OR MEDICARE Over 40 minutes of exam, counseling, chart review, and complex, high/moderate level critical decision making was performed this visit.   Future Appointments  Date Time Provider Department Center  07/03/2018  3:30 PM Judd Gaudier, NP [facility] None  08/26/2018  3:30 PM Judd Gaudier, NP [facility] None  11/26/2018  3:30 PM Lucky Cowboy, MD [facility] None  02/26/2019  3:00 PM Judd Gaudier, NP [facility] None  06/12/2019  9:00 AM Lucky Cowboy, MD [facility] None     HPI 85 y.o.female presents for follow up for transition from recent hospitalization or SNIF stay. Admit date to the hospital was 06/16/18, patient was discharged from the hospital on 06/20/18 and our clinical staff contacted the office the day after discharge to set up a follow up appointment. The discharge summary, medications, and diagnostic test results were reviewed before meeting with the patient. The patient was admitted for:   Brief Summary: Lisa Hopkins 85 y.o.femalewith medical history significant ofhiatal hernia causing small bowel obstruction gastric outlet obstruction in the past, A. fib, chronic kidney disease lives in assisted living who presented with over 6 hours of persistent nausea vomiting and abdominal pain generalized at her assisted living. Imaging revealed most of stomach in thorax with possible outlet obstruction, surgery and GI consulted, poor candidate for surgery and patient/family not interested, and pursued medical  management and observation. NG tube was placed ***   Vomiting due to large hiatal hernia and ? GOO - as noted in imaging most of her stomach is in her thorax with possible outlet obstruction - poor candidate for surgery and also not  interested in surgery  -  General surgery signed off - GI consulted -  as she was tolerating clears, no further work up is recommended - steadily advanced to pureed diet - she remains free of symptoms - have discussed the need to choose food consistencies that she will continue to tolerate   Active Problems:  Severe protein calorie malnutrition - Ensure added  Essential hypertension -resume Metoprolol andCozaar   Atrial fibrillation - resumed Coumadin and Metoprolol - INR therapeutic while hospitalized, followed outpatient by cardiology   Lab Results  Component Value Date   INR 1.9 (A) 06/25/2018   INR 2.65 06/20/2018   INR 2.34 06/19/2018     History of CVA (cerebrovascular accident)  chronic Diastolic CHF ? - diastolic function could not be measured on last ECHO- she is on Lasix as outpt    Home health {ACTION; IS/IS [id number]} involved.   Images while in the hospital: Dg Abd 1 View  Result Date: 06/16/2018 CLINICAL DATA:  Evaluate nasogastric tube placement. EXAM: ABDOMEN - 1 VIEW COMPARISON:  CT 06/16/2018 FINDINGS: Tip of the nasogastric tube is in the lower chest and may be near the GE junction. Patient has a very large hiatal hernia containing gas. NG tube side hole in the mid esophagus at the level of the carina. IMPRESSION: Nasogastric tube tip in the lower chest probably near the GE junction. Large hiatal hernia containing gas. Electronically Signed   By: Madelaine Bhat  Lowella Dandy M.D.   On: 06/16/2018 12:44   Ct Renal Stone Study  Result Date: 06/16/2018 CLINICAL DATA:  85 year old female with abdominal pain, nausea vomiting and diarrhea. EXAM: CT ABDOMEN AND PELVIS WITHOUT CONTRAST TECHNIQUE: Multidetector CT imaging of the  abdomen and pelvis was performed following the standard protocol without IV contrast. COMPARISON:  CT of the abdomen pelvis dated 03/16/2018 FINDINGS: Evaluation of this exam is limited in the absence of intravenous contrast. Lower chest: Large hiatal hernia with the majority of the stomach located lower chest. Associated atelectatic changes of the left lung base. There is coronary vascular calcification. No intra-abdominal free air or free fluid. Hepatobiliary: The liver is unremarkable. No intrahepatic biliary ductal dilatation. Cholecystectomy. Pancreas: Unremarkable. No pancreatic ductal dilatation or surrounding inflammatory changes. Spleen: Normal in size without focal abnormality. Adrenals/Urinary Tract: Adrenal glands are unremarkable. Kidneys are normal, without renal calculi, focal lesion, or hydronephrosis. Bladder is unremarkable. Stomach/Bowel: There is a large hiatal hernia containing the majority of the stomach with apparent organo-axial volvulus similar to prior CT. There is distention of the stomach with air and liquid content, likely representing a degree of gastric outlet obstruction. There is no small bowel obstruction or active inflammation. There is sigmoid diverticulosis without active inflammatory changes. The appendix is not visualized with certainty. No inflammatory changes identified in the right lower quadrant. Vascular/Lymphatic: Moderate aortoiliac atherosclerotic disease. No portal venous gas. There is no adenopathy. Reproductive: Small uterus with a calcified fundal fibroid. No adnexal masses. Other: None Musculoskeletal: Osteopenia with degenerative changes of the spine. No acute osseous pathology. IMPRESSION: 1. Large hiatal hernia containing the majority of the stomach with possible partial gastric outlet obstruction. This finding is similar to prior studies. 2. Sigmoid diverticulosis. No small bowel obstruction or active inflammation. 3. No hydronephrosis or nephrolithiasis.  Electronically Signed   By: Elgie Collard M.D.   On: 06/16/2018 01:47      Current Outpatient Medications (Cardiovascular):  .  cholestyramine light (PREVALITE) 4 GM/DOSE powder, Take 1 scoop ( 4 gm ) in a glass of Water or Juice 2 to 3 x  /day as needed for Diarrhea .  furosemide (LASIX) 20 MG tablet, TAKE 1 TABLET EACH DAY. Marland Kitchen  losartan (COZAAR) 50 MG tablet, TAKE 1/2 TO 1 TABLET ONCE A DAY. (Patient taking differently: Take 50 mg by mouth daily. ) .  metoprolol tartrate (LOPRESSOR) 25 MG tablet, Take 0.5 tablets (12.5 mg total) by mouth 2 (two) times daily.    Current Outpatient Medications (Hematological):  .  warfarin (COUMADIN) 2 MG tablet, TAKE AS DIRECTED BY ANTICOAGULATION CLINIC.  Current Outpatient Medications (Other):  Marland Kitchen  Cholecalciferol (VITAMIN D) 2000 UNITS CAPS, Take 5,000 Units by mouth daily.  Marland Kitchen  CINNAMON PO, Take 1 capsule by mouth daily. Marland Kitchen  dicyclomine (BENTYL) 20 MG tablet, Take 1 tablet 3 x day if needed for nausea, bloating, cramping or diarrhea (Patient taking differently: Take 20 mg by mouth 3 (three) times daily before meals. ) .  feeding supplement, ENSURE ENLIVE, (ENSURE ENLIVE) LIQD, Take 237 mLs by mouth 2 (two) times daily between meals. .  Magnesium Hydroxide (MAGNESIA PO), Take 250 mg by mouth daily.  .  ondansetron (ZOFRAN ODT) 4 MG disintegrating tablet, Dissolve 1 tablet under tongue every 6 to 8 hours if needed for nausea & / or vomitting .  pantoprazole (PROTONIX) 40 MG tablet, Take 1 tablet daily for Heartburn & Acid Indigestion .  potassium chloride (K-DUR) 10 MEQ tablet, TAKE 1 TABLET BY MOUTH DAILY. Marland Kitchen  QUEtiapine (SEROQUEL) 25 MG tablet, Take 0.5 tablets (12.5 mg total) by mouth at bedtime. .  ranitidine (ZANTAC) 300 MG tablet, Take 300 mg by mouth daily.  Past Medical History:  Diagnosis Date  . Anxiety   . Aphasia as late effect of cerebrovascular accident   . Atrial fibrillation (HCC)   . CKD (chronic kidney disease), stage II   . CVA  (cerebral infarction)   . Diabetes mellitus type II   . GERD (gastroesophageal reflux disease)   . Hiatal hernia   . HTN (hypertension)   . Hyperlipidemia   . IBS (irritable bowel syndrome)   . Stroke (HCC)   . Vitamin D deficiency      Allergies  Allergen Reactions  . Ace Inhibitors Other (See Comments)    Cough  . Fosamax [Alendronate Sodium] Other (See Comments)    Heart burn  . Norvasc [Amlodipine Besylate] Other (See Comments)    edema  . Zocor [Simvastatin] Other (See Comments)    Elevated CPK    ROS: all negative except above.   Physical Exam: There were no vitals filed for this visit. There were no vitals taken for this visit. General Appearance: Well nourished, in no apparent distress. Eyes: PERRLA, EOMs, conjunctiva no swelling or erythema Sinuses: No Frontal/maxillary tenderness ENT/Mouth: Ext aud canals clear, TMs without erythema, bulging. No erythema, swelling, or exudate on post pharynx.  Tonsils not swollen or erythematous. Hearing normal.  Neck: Supple, thyroid normal.  Respiratory: Respiratory effort normal, BS equal bilaterally without rales, rhonchi, wheezing or stridor.  Cardio: RRR with no MRGs. Brisk peripheral pulses without edema.  Abdomen: Soft, + BS.  Non tender, no guarding, rebound, hernias, masses. Lymphatics: Non tender without lymphadenopathy.  Musculoskeletal: Full ROM, 5/5 strength, normal gait.  Skin: Warm, dry without rashes, lesions, ecchymosis.  Neuro: Cranial nerves intact. Normal muscle tone, no cerebellar symptoms. Sensation intact.  Psych: Awake and oriented X 3, normal affect, Insight and Judgment appropriate.     Dan MakerAshley C Annmargaret Decaprio, NP 5:15 PM [facility]

## 2018-07-01 NOTE — Telephone Encounter (Signed)
Called patient on 07/01/2018 , 2:33 PM in an attempt to reach the patient for a hospital follow up.   Admit date: 06/16/18 Discharge: 06/20/18   She DOES NOT  have any questions or concerns about medications from the hospital admission. The patient's medications were reviewed over the phone, they were counseled to bring in all current medications to the hospital follow up visit.   I advised the patient to call if any questions or concerns arise about the hospital admission or medications    Home health WAS  started in the hospital.  All questions were answered and a follow up appointment was made.   Prior to Admission medications   Medication Sig Start Date End Date Taking? Authorizing Provider  Cholecalciferol (VITAMIN D) 2000 UNITS CAPS Take 5,000 Units by mouth daily.     [provider]  cholestyramine light (PREVALITE) 4 GM/DOSE powder Take 1 scoop ( 4 gm ) in a glass of Water or Juice 2 to 3 x  /day as needed for Diarrhea 04/30/18   Lucky Cowboy, MD  CINNAMON PO Take 1 capsule by mouth daily.    [provider]  dicyclomine (BENTYL) 20 MG tablet Take 1 tablet 3 x day if needed for nausea, bloating, cramping or diarrhea Patient taking differently: Take 20 mg by mouth 3 (three) times daily before meals.  05/16/18   Lucky Cowboy, MD  feeding supplement, ENSURE ENLIVE, (ENSURE ENLIVE) LIQD Take 237 mLs by mouth 2 (two) times daily between meals. 06/20/18   Calvert Cantor, MD  furosemide (LASIX) 20 MG tablet TAKE 1 TABLET EACH DAY. 04/29/18   Lucky Cowboy, MD  losartan (COZAAR) 50 MG tablet TAKE 1/2 TO 1 TABLET ONCE A DAY. Patient taking differently: Take 50 mg by mouth daily.  03/26/18   Lucky Cowboy, MD  Magnesium Hydroxide (MAGNESIA PO) Take 250 mg by mouth daily.     [provider]  metoprolol tartrate (LOPRESSOR) 25 MG tablet Take 0.5 tablets (12.5 mg total) by mouth 2 (two) times daily. 07/16/17 03/16/26  Lucky Cowboy, MD  ondansetron (ZOFRAN  ODT) 4 MG disintegrating tablet Dissolve 1 tablet under tongue every 6 to 8 hours if needed for nausea & / or vomitting 04/10/18   Lucky Cowboy, MD  pantoprazole (PROTONIX) 40 MG tablet Take 1 tablet daily for Heartburn & Acid Indigestion 04/10/18   Lucky Cowboy, MD  potassium chloride (K-DUR) 10 MEQ tablet TAKE 1 TABLET BY MOUTH DAILY. 05/15/18   Lucky Cowboy, MD  QUEtiapine (SEROQUEL) 25 MG tablet Take 0.5 tablets (12.5 mg total) by mouth at bedtime. 03/23/18   Regalado, Belkys A, MD  ranitidine (ZANTAC) 300 MG tablet Take 300 mg by mouth daily.    [provider]  warfarin (COUMADIN) 2 MG tablet TAKE AS DIRECTED BY ANTICOAGULATION CLINIC. 06/28/18   Lyn Records, MD

## 2018-07-01 NOTE — Telephone Encounter (Signed)
Patient has follow up office visit. Knows to call with any questions or concerns.   

## 2018-07-02 ENCOUNTER — Ambulatory Visit (INDEPENDENT_AMBULATORY_CARE_PROVIDER_SITE_OTHER): Payer: Medicare Other | Admitting: Pharmacist

## 2018-07-02 DIAGNOSIS — Z8673 Personal history of transient ischemic attack (TIA), and cerebral infarction without residual deficits: Secondary | ICD-10-CM

## 2018-07-02 DIAGNOSIS — I4891 Unspecified atrial fibrillation: Secondary | ICD-10-CM | POA: Diagnosis not present

## 2018-07-02 DIAGNOSIS — Z0001 Encounter for general adult medical examination with abnormal findings: Secondary | ICD-10-CM

## 2018-07-02 LAB — POCT INR: INR: 2.4 (ref 2.0–3.0)

## 2018-07-03 ENCOUNTER — Ambulatory Visit: Payer: Self-pay | Admitting: Adult Health

## 2018-07-03 NOTE — Progress Notes (Deleted)
Hospital follow up  Assessment and Plan: Hospital visit follow up for: emesis secondary to SBO and large hiatal hernia  All medications were reviewed with patient and family and fully reconciled. All questions answered fully, and patient and family members were encouraged to call the office with any further questions or concerns. Discussed goal to avoid readmission related to this diagnosis.  There are no discontinued medications.  CAN NOT DO FOR BCBS REGULAR OR MEDICARE Over 40 minutes of exam, counseling, chart review, and complex, high/moderate level critical decision making was performed this visit.   Future Appointments  Date Time Provider Department Center  07/04/2018  4:00 PM Judd Gaudierorbett, Dontae Minerva, NP [facility] None  08/26/2018  3:30 PM Judd Gaudierorbett, Ayomide Purdy, NP [facility] None  11/26/2018  3:30 PM Lucky CowboyMcKeown, William, MD [facility] None  02/26/2019  3:00 PM Judd Gaudierorbett, Grae Leathers, NP [facility] None  06/12/2019  9:00 AM Lucky CowboyMcKeown, William, MD [facility] None     HPI 85 y.o.female presents for follow up for transition from recent hospitalization or SNIF stay. Admit date to the hospital was 06/16/18, patient was discharged from the hospital on 06/20/18 and our clinical staff contacted the office the day after discharge to set up a follow up appointment. The discharge summary, medications, and diagnostic test results were reviewed before meeting with the patient. The patient was admitted for:   Brief Summary: Lisa Hopkins 85 y.o.femalewith medical history significant ofhiatal hernia causing small bowel obstruction gastric outlet obstruction in the past, A. fib, chronic kidney disease lives in assisted living who presented with over 6 hours of persistent nausea vomiting and abdominal pain generalized at her assisted living. Imaging revealed most of stomach in thorax with possible outlet obstruction, surgery and GI consulted, poor candidate for surgery and patient/family not interested, and pursued medical  management and observation. NG tube was placed ***   Vomiting due to large hiatal hernia and ? GOO - as noted in imaging most of her stomach is in her thorax with possible outlet obstruction - poor candidate for surgery and also not  interested in surgery  -  General surgery signed off - GI consulted -  as she was tolerating clears, no further work up is recommended - steadily advanced to pureed diet - she remains free of symptoms - have discussed the need to choose food consistencies that she will continue to tolerate   Active Problems:  Severe protein calorie malnutrition - Ensure added  Essential hypertension -resume Metoprolol andCozaar   Atrial fibrillation - resumed Coumadin and Metoprolol - INR therapeutic while hospitalized, followed outpatient by cardiology   Lab Results  Component Value Date   INR 2.4 07/02/2018   INR 1.9 (A) 06/25/2018   INR 2.65 06/20/2018     History of CVA (cerebrovascular accident)  chronic Diastolic CHF ? - diastolic function could not be measured on last ECHO- she is on Lasix as outpt    Home health {ACTION; IS/IS [id number]} involved.   Images while in the hospital: Dg Abd 1 View  Result Date: 06/16/2018 CLINICAL DATA:  Evaluate nasogastric tube placement. EXAM: ABDOMEN - 1 VIEW COMPARISON:  CT 06/16/2018 FINDINGS: Tip of the nasogastric tube is in the lower chest and may be near the GE junction. Patient has a very large hiatal hernia containing gas. NG tube side hole in the mid esophagus at the level of the carina. IMPRESSION: Nasogastric tube tip in the lower chest probably near the GE junction. Large hiatal hernia containing gas. Electronically Signed   By: Madelaine BhatAdam  Lowella Dandy M.D.   On: 06/16/2018 12:44   Ct Renal Stone Study  Result Date: 06/16/2018 CLINICAL DATA:  85 year old female with abdominal pain, nausea vomiting and diarrhea. EXAM: CT ABDOMEN AND PELVIS WITHOUT CONTRAST TECHNIQUE: Multidetector CT imaging of the  abdomen and pelvis was performed following the standard protocol without IV contrast. COMPARISON:  CT of the abdomen pelvis dated 03/16/2018 FINDINGS: Evaluation of this exam is limited in the absence of intravenous contrast. Lower chest: Large hiatal hernia with the majority of the stomach located lower chest. Associated atelectatic changes of the left lung base. There is coronary vascular calcification. No intra-abdominal free air or free fluid. Hepatobiliary: The liver is unremarkable. No intrahepatic biliary ductal dilatation. Cholecystectomy. Pancreas: Unremarkable. No pancreatic ductal dilatation or surrounding inflammatory changes. Spleen: Normal in size without focal abnormality. Adrenals/Urinary Tract: Adrenal glands are unremarkable. Kidneys are normal, without renal calculi, focal lesion, or hydronephrosis. Bladder is unremarkable. Stomach/Bowel: There is a large hiatal hernia containing the majority of the stomach with apparent organo-axial volvulus similar to prior CT. There is distention of the stomach with air and liquid content, likely representing a degree of gastric outlet obstruction. There is no small bowel obstruction or active inflammation. There is sigmoid diverticulosis without active inflammatory changes. The appendix is not visualized with certainty. No inflammatory changes identified in the right lower quadrant. Vascular/Lymphatic: Moderate aortoiliac atherosclerotic disease. No portal venous gas. There is no adenopathy. Reproductive: Small uterus with a calcified fundal fibroid. No adnexal masses. Other: None Musculoskeletal: Osteopenia with degenerative changes of the spine. No acute osseous pathology. IMPRESSION: 1. Large hiatal hernia containing the majority of the stomach with possible partial gastric outlet obstruction. This finding is similar to prior studies. 2. Sigmoid diverticulosis. No small bowel obstruction or active inflammation. 3. No hydronephrosis or nephrolithiasis.  Electronically Signed   By: Elgie Collard M.D.   On: 06/16/2018 01:47      Current Outpatient Medications (Cardiovascular):  .  cholestyramine light (PREVALITE) 4 GM/DOSE powder, Take 1 scoop ( 4 gm ) in a glass of Water or Juice 2 to 3 x  /day as needed for Diarrhea .  furosemide (LASIX) 20 MG tablet, TAKE 1 TABLET EACH DAY. Marland Kitchen  losartan (COZAAR) 50 MG tablet, TAKE 1/2 TO 1 TABLET ONCE A DAY. (Patient taking differently: Take 50 mg by mouth daily. ) .  metoprolol tartrate (LOPRESSOR) 25 MG tablet, Take 0.5 tablets (12.5 mg total) by mouth 2 (two) times daily.    Current Outpatient Medications (Hematological):  .  warfarin (COUMADIN) 2 MG tablet, TAKE AS DIRECTED BY ANTICOAGULATION CLINIC.  Current Outpatient Medications (Other):  Marland Kitchen  Cholecalciferol (VITAMIN D) 2000 UNITS CAPS, Take 5,000 Units by mouth daily.  Marland Kitchen  CINNAMON PO, Take 1 capsule by mouth daily. Marland Kitchen  dicyclomine (BENTYL) 20 MG tablet, Take 1 tablet 3 x day if needed for nausea, bloating, cramping or diarrhea (Patient taking differently: Take 20 mg by mouth 3 (three) times daily before meals. ) .  feeding supplement, ENSURE ENLIVE, (ENSURE ENLIVE) LIQD, Take 237 mLs by mouth 2 (two) times daily between meals. .  Magnesium Hydroxide (MAGNESIA PO), Take 250 mg by mouth daily.  .  ondansetron (ZOFRAN ODT) 4 MG disintegrating tablet, Dissolve 1 tablet under tongue every 6 to 8 hours if needed for nausea & / or vomitting .  pantoprazole (PROTONIX) 40 MG tablet, Take 1 tablet daily for Heartburn & Acid Indigestion .  potassium chloride (K-DUR) 10 MEQ tablet, TAKE 1 TABLET BY MOUTH DAILY. Marland Kitchen  QUEtiapine (SEROQUEL) 25 MG tablet, Take 0.5 tablets (12.5 mg total) by mouth at bedtime. .  ranitidine (ZANTAC) 300 MG tablet, Take 300 mg by mouth daily.  Past Medical History:  Diagnosis Date  . Anxiety   . Aphasia as late effect of cerebrovascular accident   . Atrial fibrillation (HCC)   . CKD (chronic kidney disease), stage II   . CVA  (cerebral infarction)   . Diabetes mellitus type II   . GERD (gastroesophageal reflux disease)   . Hiatal hernia   . HTN (hypertension)   . Hyperlipidemia   . IBS (irritable bowel syndrome)   . Stroke (HCC)   . Vitamin D deficiency      Allergies  Allergen Reactions  . Ace Inhibitors Other (See Comments)    Cough  . Fosamax [Alendronate Sodium] Other (See Comments)    Heart burn  . Norvasc [Amlodipine Besylate] Other (See Comments)    edema  . Zocor [Simvastatin] Other (See Comments)    Elevated CPK    ROS: all negative except above.   Physical Exam: There were no vitals filed for this visit. There were no vitals taken for this visit. General Appearance: Well nourished, in no apparent distress. Eyes: PERRLA, EOMs, conjunctiva no swelling or erythema Sinuses: No Frontal/maxillary tenderness ENT/Mouth: Ext aud canals clear, TMs without erythema, bulging. No erythema, swelling, or exudate on post pharynx.  Tonsils not swollen or erythematous. Hearing normal.  Neck: Supple, thyroid normal.  Respiratory: Respiratory effort normal, BS equal bilaterally without rales, rhonchi, wheezing or stridor.  Cardio: RRR with no MRGs. Brisk peripheral pulses without edema.  Abdomen: Soft, + BS.  Non tender, no guarding, rebound, hernias, masses. Lymphatics: Non tender without lymphadenopathy.  Musculoskeletal: Full ROM, 5/5 strength, normal gait.  Skin: Warm, dry without rashes, lesions, ecchymosis.  Neuro: Cranial nerves intact. Normal muscle tone, no cerebellar symptoms. Sensation intact.  Psych: Awake and oriented X 3, normal affect, Insight and Judgment appropriate.     Dan Maker, NP 5:21 PM [facility]

## 2018-07-04 ENCOUNTER — Ambulatory Visit: Payer: Self-pay | Admitting: Adult Health

## 2018-07-08 NOTE — Progress Notes (Signed)
Hospital follow up  Assessment and Plan: Hospital visit follow up for: emesis secondary to SBO and large hiatal hernia  Lisa Hopkins was seen today for hospitalization follow-up.  Diagnoses and all orders for this visit:  SBO (small bowel obstruction) (HCC) Resolved, discussed soft diet, liquid diet for any recurrent symptoms Poor candidate for surgery, medical management only   Intractable vomiting with nausea Resolved   Hiatal hernia Poor candidate for surgery, conservative interventions only Lifestyle discussed, soft diet reviewed Present to ED for NGT and management for any further intractable emesis  Gastroesophageal reflux disease, esophagitis presence not specified  Diarrhea Follow up as scheduled with Dr. Matthias HughsBuccini D/c cholestyramine as she has noted no benefit with this Continue bentyl, increase imodium, consider adding daily soluble fiber -     loperamide (IMODIUM A-D) 2 MG tablet; Take 1-2 tabs 3 times a day as needed for loose stools.   All medications were reviewed with patient and family and fully reconciled. All questions answered fully, and patient and family members were encouraged to call the office with any further questions or concerns. Discussed goal to avoid readmission related to this diagnosis.  Medications Discontinued During This Encounter  Medication Reason  . cholestyramine light (PREVALITE) 4 GM/DOSE powder Ineffective   Over 40 minutes of exam, counseling, chart review, and complex, high/moderate level critical decision making was performed this visit.   Future Appointments  Date Time Provider Department Center  08/26/2018  3:30 PM Judd GaudierCorbett, Falicity Sheets, NP [facility] None  11/26/2018  3:30 PM Lucky CowboyMcKeown, William, MD [facility] None  02/26/2019  3:00 PM Judd Gaudierorbett, Shalin Linders, NP [facility] None  06/12/2019  9:00 AM Lucky CowboyMcKeown, William, MD [facility] None     HPI 85 y.o.female presents for follow up for transition from recent hospitalization or SNIF stay. Admit date to  the hospital was 06/16/18, patient was discharged from the hospital on 06/20/18 and our clinical staff contacted the office the day after discharge to set up a follow up appointment. The discharge summary, medications, and diagnostic test results were reviewed before meeting with the patient. The patient was admitted for:   Brief Summary: Lisa Hopkins 85 y.o.femalewith medical history significant ofhiatal hernia causing small bowel obstruction gastric outlet obstruction in the past, A. fib, chronic kidney disease lives in assisted living who presented to ED with over 6 hours of persistent nausea vomiting and abdominal pain generalized at her assisted living. Imaging revealed most of stomach in thorax with possible outlet obstruction, surgery and GI consulted, poor candidate for surgery and patient/family not interested, and pursued medical management and observation. NG tube was placed for less than 24 hours and she quickly improved with clear liquid diet and progressed steadily to pureed diet and was discharged home to assisted living.    She presents today accompanied by her son, reports she is doing well without any abdominal pain, nausea or vomiting, and is tolerating a soft diet well. She does however have persistent ongoing diarrhea, reports this is ongoing for several months since prior to admission. No recent antibiotics, family reports she did have C. Diff checked at some point though I cannot find record of this in Epic EMR. She reports after meals she will experience "gurgling" and suddenly have watery diarrhea. She denies mucus, hematochezia, melena. She has been taking bentyl 20 mg TID and cholestyramine, also 1 tab of imodium daily but reports no benefit with this regimen. She has not tried increasing imodium dose. She has appointment to discuss these symptoms with Dr. Matthias HughsBuccini scheduled  in 2 weeks.   Severe protein calorie malnutrition - Ensure added at discharge, she has been  taking once daily, encouraged to increase to BID as prescribed  Essential hypertension -Stable, Metoprolol andCozaar were resumed prior to discharge Her blood pressure has been controlled at home, today their BP is BP: 122/74  She does not workout. She denies chest pain, shortness of breath, dizziness.  Atrial fibrillation - resumed Coumadin and Metoprolol - INR therapeutic while hospitalized, followed by at home INR checks via [facility] for the last several weeks, will transition back to outpatient management by cardiology in 2 weeks. Denies abx use, missed doses, any usual signs of bleeding, falls.   Lab Results  Component Value Date   INR 2.6 07/09/2018   INR 2.4 07/02/2018   INR 1.9 (A) 06/25/2018    chronic Diastolic CHF  - diastolic function could not be measured on last ECHO- she is on Lasix as outpt Denies dyspnea on exertion, orthopnea, paroxysmal nocturnal dyspnea and edema. Positive for none. Wt Readings from Last 3 Encounters:  07/09/18 113 lb (51.3 kg)  06/20/18 115 lb 4.8 oz (52.3 kg)  05/27/18 115 lb 9.6 oz (52.4 kg)    Home health is involved.   Images while in the hospital: Dg Abd 1 View  Result Date: 06/16/2018 CLINICAL DATA:  Evaluate nasogastric tube placement. EXAM: ABDOMEN - 1 VIEW COMPARISON:  CT 06/16/2018 FINDINGS: Tip of the nasogastric tube is in the lower chest and may be near the GE junction. Patient has a very large hiatal hernia containing gas. NG tube side hole in the mid esophagus at the level of the carina. IMPRESSION: Nasogastric tube tip in the lower chest probably near the GE junction. Large hiatal hernia containing gas. Electronically Signed   By: Richarda OverlieAdam  Henn M.D.   On: 06/16/2018 12:44   Ct Renal Stone Study  Result Date: 06/16/2018 CLINICAL DATA:  85 year old female with abdominal pain, nausea vomiting and diarrhea. EXAM: CT ABDOMEN AND PELVIS WITHOUT CONTRAST TECHNIQUE: Multidetector CT imaging of the abdomen and pelvis was performed  following the standard protocol without IV contrast. COMPARISON:  CT of the abdomen pelvis dated 03/16/2018 FINDINGS: Evaluation of this exam is limited in the absence of intravenous contrast. Lower chest: Large hiatal hernia with the majority of the stomach located lower chest. Associated atelectatic changes of the left lung base. There is coronary vascular calcification. No intra-abdominal free air or free fluid. Hepatobiliary: The liver is unremarkable. No intrahepatic biliary ductal dilatation. Cholecystectomy. Pancreas: Unremarkable. No pancreatic ductal dilatation or surrounding inflammatory changes. Spleen: Normal in size without focal abnormality. Adrenals/Urinary Tract: Adrenal glands are unremarkable. Kidneys are normal, without renal calculi, focal lesion, or hydronephrosis. Bladder is unremarkable. Stomach/Bowel: There is a large hiatal hernia containing the majority of the stomach with apparent organo-axial volvulus similar to prior CT. There is distention of the stomach with air and liquid content, likely representing a degree of gastric outlet obstruction. There is no small bowel obstruction or active inflammation. There is sigmoid diverticulosis without active inflammatory changes. The appendix is not visualized with certainty. No inflammatory changes identified in the right lower quadrant. Vascular/Lymphatic: Moderate aortoiliac atherosclerotic disease. No portal venous gas. There is no adenopathy. Reproductive: Small uterus with a calcified fundal fibroid. No adnexal masses. Other: None Musculoskeletal: Osteopenia with degenerative changes of the spine. No acute osseous pathology. IMPRESSION: 1. Large hiatal hernia containing the majority of the stomach with possible partial gastric outlet obstruction. This finding is similar to prior studies. 2.  Sigmoid diverticulosis. No small bowel obstruction or active inflammation. 3. No hydronephrosis or nephrolithiasis. Electronically Signed   By: Elgie Collard M.D.   On: 06/16/2018 01:47      Current Outpatient Medications (Cardiovascular):  .  furosemide (LASIX) 20 MG tablet, TAKE 1 TABLET EACH DAY. Marland Kitchen  losartan (COZAAR) 50 MG tablet, TAKE 1/2 TO 1 TABLET ONCE A DAY. (Patient taking differently: Take 50 mg by mouth daily. ) .  metoprolol tartrate (LOPRESSOR) 25 MG tablet, Take 0.5 tablets (12.5 mg total) by mouth 2 (two) times daily.    Current Outpatient Medications (Hematological):  .  warfarin (COUMADIN) 2 MG tablet, TAKE AS DIRECTED BY ANTICOAGULATION CLINIC.  Current Outpatient Medications (Other):  Marland Kitchen  Cholecalciferol (VITAMIN D) 2000 UNITS CAPS, Take 5,000 Units by mouth daily.  Marland Kitchen  CINNAMON PO, Take 1 capsule by mouth daily. Marland Kitchen  dicyclomine (BENTYL) 20 MG tablet, Take 1 tablet 3 x day if needed for nausea, bloating, cramping or diarrhea (Patient taking differently: Take 20 mg by mouth 3 (three) times daily before meals. ) .  feeding supplement, ENSURE ENLIVE, (ENSURE ENLIVE) LIQD, Take 237 mLs by mouth 2 (two) times daily between meals. .  Magnesium Hydroxide (MAGNESIA PO), Take 250 mg by mouth daily.  .  ondansetron (ZOFRAN ODT) 4 MG disintegrating tablet, Dissolve 1 tablet under tongue every 6 to 8 hours if needed for nausea & / or vomitting .  pantoprazole (PROTONIX) 40 MG tablet, Take 1 tablet daily for Heartburn & Acid Indigestion .  potassium chloride (K-DUR) 10 MEQ tablet, TAKE 1 TABLET BY MOUTH DAILY. Marland Kitchen  QUEtiapine (SEROQUEL) 25 MG tablet, Take 0.5 tablets (12.5 mg total) by mouth at bedtime. .  ranitidine (ZANTAC) 300 MG tablet, Take 300 mg by mouth daily. Marland Kitchen  loperamide (IMODIUM A-D) 2 MG tablet, Take 1-2 tabs 3 times a day as needed for loose stools.  Past Medical History:  Diagnosis Date  . Anxiety   . Aphasia as late effect of cerebrovascular accident   . Atrial fibrillation (HCC)   . CKD (chronic kidney disease), stage II   . CVA (cerebral infarction)   . Diabetes mellitus type II   . GERD (gastroesophageal  reflux disease)   . Hiatal hernia   . HTN (hypertension)   . Hyperlipidemia   . IBS (irritable bowel syndrome)   . Stroke (HCC)   . Vitamin D deficiency      Allergies  Allergen Reactions  . Ace Inhibitors Other (See Comments)    Cough  . Fosamax [Alendronate Sodium] Other (See Comments)    Heart burn  . Norvasc [Amlodipine Besylate] Other (See Comments)    edema  . Zocor [Simvastatin] Other (See Comments)    Elevated CPK    ROS: all negative except above.   Physical Exam: Filed Weights   07/09/18 1512  Weight: 113 lb (51.3 kg)   BP 122/74   Pulse 60   Temp 97.7 F (36.5 C)   Wt 113 lb (51.3 kg)   SpO2 97%   BMI 20.67 kg/m  General Appearance: Frail elder, well dressed/groomed, in no apparent distress. Eyes: PERRLA, EOMs, conjunctiva no swelling or erythema ENT/Mouth:  No erythema, swelling, or exudate on post pharynx.  Tonsils not swollen or erythematous. Hearing normal.  Neck: Supple Respiratory: Respiratory effort normal, BS equal bilaterally without rales, rhonchi, wheezing or stridor.  Cardio: RRR with no MRGs. Brisk peripheral pulses without edema.  Abdomen: Soft, + BS.  Non tender, no guarding, rebound, masses.  Lymphatics: Non tender without lymphadenopathy.  Musculoskeletal: slow, steady gait Skin: Warm, dry without rashes, lesions, ecchymosis.  Neuro: Normal muscle tone, no cerebellar symptoms. Sensation intact.  Psych: Awake and oriented X 3, normal affect, Insight and Judgment appropriate.     Dan Maker, NP 3:47 PM [facility]

## 2018-07-09 ENCOUNTER — Ambulatory Visit (INDEPENDENT_AMBULATORY_CARE_PROVIDER_SITE_OTHER): Payer: Medicare Other | Admitting: Pharmacist

## 2018-07-09 ENCOUNTER — Encounter: Payer: Self-pay | Admitting: Adult Health

## 2018-07-09 ENCOUNTER — Ambulatory Visit (INDEPENDENT_AMBULATORY_CARE_PROVIDER_SITE_OTHER): Payer: Medicare Other | Admitting: Adult Health

## 2018-07-09 VITALS — BP 122/74 | HR 60 | Temp 97.7°F | Wt 113.0 lb

## 2018-07-09 DIAGNOSIS — R112 Nausea with vomiting, unspecified: Secondary | ICD-10-CM | POA: Diagnosis not present

## 2018-07-09 DIAGNOSIS — K56609 Unspecified intestinal obstruction, unspecified as to partial versus complete obstruction: Secondary | ICD-10-CM

## 2018-07-09 DIAGNOSIS — Z0001 Encounter for general adult medical examination with abnormal findings: Secondary | ICD-10-CM

## 2018-07-09 DIAGNOSIS — I10 Essential (primary) hypertension: Secondary | ICD-10-CM

## 2018-07-09 DIAGNOSIS — K449 Diaphragmatic hernia without obstruction or gangrene: Secondary | ICD-10-CM | POA: Diagnosis not present

## 2018-07-09 DIAGNOSIS — K219 Gastro-esophageal reflux disease without esophagitis: Secondary | ICD-10-CM

## 2018-07-09 DIAGNOSIS — I4891 Unspecified atrial fibrillation: Secondary | ICD-10-CM

## 2018-07-09 DIAGNOSIS — Z8673 Personal history of transient ischemic attack (TIA), and cerebral infarction without residual deficits: Secondary | ICD-10-CM | POA: Diagnosis not present

## 2018-07-09 LAB — POCT INR: INR: 2.6 (ref 2.0–3.0)

## 2018-07-09 MED ORDER — LOPERAMIDE HCL 2 MG PO TABS: ORAL_TABLET | ORAL | 1 refills | Status: DC

## 2018-07-09 NOTE — Patient Instructions (Addendum)
Consider adding a daily soluble fiber supplement (citracel or benefiber)      Soft-Food Eating Plan A soft-food eating plan includes foods that are safe and easy to chew and swallow. Your health care provider or dietitian can help you find foods and flavors that fit into this plan. Follow this plan until your health care provider or dietitian says it is safe to start eating other foods and food textures. What are tips for following this plan? General guidelines   Take small bites of food, or cut food into pieces about  inch or smaller. Bite-sized pieces of food are easier to chew and swallow.  Eat moist foods. Avoid overly dry foods.  Avoid foods that: ? Are difficult to swallow, such as dry, chunky, crispy, or sticky foods. ? Are difficult to chew, such as hard, tough, or stringy foods. ? Contain nuts, seeds, or fruits.  Follow instructions from your dietitian about the types of liquids that are safe for you to swallow. You may be allowed to have: ? Thick liquids only. This includes only liquids that are thicker than honey. ? Thin and thick liquids. This includes all beverages and foods that become liquid at room temperature.  To make thick liquids: ? Purchase a commercial liquid thickening powder. These are available at grocery stores and pharmacies. ? Mix the thickener into liquids according to instructions on the label. ? Purchase ready-made thickened liquids. ? Thicken soup by pureeing, straining to remove chunks, and adding flour, potato flakes, or corn starch. ? Add commercial thickener to foods that become liquid at room temperature, such as milk shakes, yogurt, ice cream, gelatin, and sherbet.  Ask your health care provider whether you need to take a fiber supplement. Cooking  Cook meats so they stay tender and moist. Use methods like braising, stewing, or baking in liquid.  Cook vegetables and fruit until they are soft enough to be mashed with a fork.  Peel soft,  fresh fruits such as peaches, nectarines, and melons.  When making soup, make sure chunks of meat and vegetables are smaller than  inch.  Reheat leftover foods slowly so that a tough crust does not form. What foods are allowed? The items listed below may not be a complete list. Talk with your dietitian about what dietary choices are best for you. Grains Breads, muffins, pancakes, or waffles moistened with syrup, jelly, or butter. Dry cereals well-moistened with milk. Moist, cooked cereals. Well-cooked pasta and rice. Vegetables All soft-cooked vegetables. Shredded lettuce. Fruits All canned and cooked fruits. Soft, peeled fresh fruits. Strawberries. Dairy Milk. Cream. Yogurt. Cottage cheese. Soft cheese without the rind. Meats and other protein foods Tender, moist ground meat, poultry, or fish. Meat cooked in gravy or sauces. Eggs. Sweets and desserts Ice cream. Milk shakes. Sherbet. Pudding. Fats and oils Butter. Margarine. Olive, canola, sunflower, and grapeseed oil. Smooth salad dressing. Smooth cream cheese. Mayonnaise. Gravy. What foods are not allowed? The items listed bemay not be a complete list. Talk with your dietitian about what dietary choices are best for you. Grains Coarse or dry cereals, such as bran, granola, and shredded wheat. Tough or chewy crusty breads, such as [place] bread or baguettes. Breads with nuts, seeds, or fruit. Vegetables All raw vegetables. Cooked corn. Cooked vegetables that are tough or stringy. Tough, crisp, fried potatoes and potato skins. Fruits Fresh fruits with skins or seeds, or both, such as apples, pears, and grapes. Stringy, high-pulp fruits, such as papaya, pineapple, coconut, and mango. Fruit leather and  all dried fruit. Dairy Yogurt with nuts or coconut. Meats and other protein foods Hard, dry sausages. Dry meat, poultry, or fish. Meats with gristle. Fish with bones. Fried meat or fish. Lunch meat and hotdogs. Nuts and seeds. Chunky  peanut butter or other nut butters. Sweets and desserts Cakes or cookies that are very dry or chewy. Desserts with dried fruit, nuts, or coconut. Fried pastries. Very rich pastries. Fats and oils Cream cheese with fruit or nuts. Salad dressings with seeds or chunks. Summary  A soft-food eating plan includes foods that are safe and easy to swallow. Generally, the foods should be soft enough to be mashed with a fork.  Avoid foods that are dry, hard to chew, crunchy, sticky, stringy, or crispy.  Ask your health care provider whether you need to thicken your liquids and if you need to take a fiber supplement. This information is not intended to replace advice given to you by your health care provider. Make sure you discuss any questions you have with your health care provider. Document Released: 08/22/2007 Document Revised: 07/18/2016 Document Reviewed: 07/18/2016 Elsevier Interactive Patient Education  2019 Elsevier Inc.    Loperamide tablets or capsules What is this medicine? LOPERAMIDE (loe PER a mide) is used to treat diarrhea. This medicine may be used for other purposes; ask your health care provider or pharmacist if you have questions. COMMON BRAND NAME(S): Anti-Diarrheal, Imodium A-D, K-Pek II What should I tell my health care provider before I take this medicine? They need to know if you have any of these conditions: -a black or bloody stool -bacterial food poisoning -colitis or mucus in your stool -currently taking an antibiotic medication for an infection -fever -history of irregular heartbeat -liver disease -severe abdominal pain, swelling or bulging -an unusual or allergic reaction to loperamide, other medicines, foods, dyes, or preservatives -pregnant or trying to get pregnant -breast-feeding How should I use this medicine? Take this medicine by mouth with a glass of water. Follow the directions on the prescription label. Take your doses at regular intervals. Do not  take your medicine more often than directed. Talk to your pediatrician regarding the use of this medicine in children. Special care may be needed. Overdosage: If you think you have taken too much of this medicine contact a poison control center or emergency room at once. NOTE: This medicine is only for you. Do not share this medicine with others. What if I miss a dose? This does not apply. This medicine is not for regular use. Only take this medicine while you continue to have loose bowel movements. Do not take more medicine than recommended by the packaging label or by your healthcare professional. What may interact with this medicine? Do not take this medicine with any of the following medications: - alosetron This medicine may also interact with the following medications: -cimetidine -clarithromycin -erythromycin -gemfibrozil -itraconazole -ketoconazole -quinidine -quinine -ranitidine -ritonavir -saquinavir This list may not describe all possible interactions. Give your health care provider a list of all the medicines, herbs, non-prescription drugs, or dietary supplements you use. Also tell them if you smoke, drink alcohol, or use illegal drugs. Some items may interact with your medicine. What should I watch for while using this medicine? Do not take this medicine for more than 2 days without asking your doctor or health care professional. Do not use doses higher than those prescribed by your doctor or listed on the label. Check with your doctor or health care professional right away if you  develop a fever, severe abdominal pain, swelling or bulging, or if you have have bloody/black diarrhea or stools. You may get drowsy or dizzy. Do not drive, use machinery, or do anything that needs mental alertness until you know how this medicine affects you. Do not stand or sit up quickly, especially if you are an older patient. This reduces the risk of dizzy or fainting spells. Alcohol can increase  possible drowsiness and dizziness. Avoid alcoholic drinks. Your mouth may get dry. Chewing sugarless gum or sucking hard candy, and drinking plenty of water may help. Contact your doctor if the problem does not go away or is severe. Drinking plenty of water can also help prevent dehydration that can occur with diarrhea. Elderly patients may have a more variable response to the effects of this medicine, and are more susceptible to the effects of dehydration. What side effects may I notice from receiving this medicine? Side effects that you should report to your doctor or health care professional as soon as possible: - allergic reactions like skin rash, itching or hives, swelling of the face, lips, or tongue -bloated, swollen feeling in your abdomen -blurred vision -loss of appetite -signs and symptoms of a dangerous change in heartbeat or heart rhythm like chest pain; dizziness; fast or irregular heartbeat palpitations; feeling faint or lightheaded, falls; breathing problems -stomach pain Side effects that usually do not require medical attention (report to your doctor or health care professional if they continue or are bothersome): - constipation -drowsiness or dizziness -dry mouth -nausea, vomiting This list may not describe all possible side effects. Call your doctor for medical advice about side effects. You may report side effects to FDA at 1-800-FDA-1088. Where should I keep my medicine? Keep out of the reach of children. Store at room temperature between 15 and 25 degrees C (59 and 77 degrees F). Keep container tightly closed. Throw away any unused medicine after the expiration date. NOTE: This sheet is a summary. It may not cover all possible information. If you have questions about this medicine, talk to your doctor, pharmacist, or health care provider.  2019 Elsevier/Gold Standard (2017-08-06 11:30:48)

## 2018-07-23 ENCOUNTER — Ambulatory Visit (INDEPENDENT_AMBULATORY_CARE_PROVIDER_SITE_OTHER): Payer: Medicare Other | Admitting: Pharmacist

## 2018-07-23 DIAGNOSIS — K591 Functional diarrhea: Secondary | ICD-10-CM | POA: Diagnosis not present

## 2018-07-23 DIAGNOSIS — I4891 Unspecified atrial fibrillation: Secondary | ICD-10-CM | POA: Diagnosis not present

## 2018-07-23 DIAGNOSIS — K449 Diaphragmatic hernia without obstruction or gangrene: Secondary | ICD-10-CM | POA: Diagnosis not present

## 2018-07-23 DIAGNOSIS — R112 Nausea with vomiting, unspecified: Secondary | ICD-10-CM | POA: Diagnosis not present

## 2018-07-23 DIAGNOSIS — Z8673 Personal history of transient ischemic attack (TIA), and cerebral infarction without residual deficits: Secondary | ICD-10-CM | POA: Diagnosis not present

## 2018-07-23 DIAGNOSIS — Z0001 Encounter for general adult medical examination with abnormal findings: Secondary | ICD-10-CM

## 2018-07-23 LAB — POCT INR: INR: 2.4 (ref 2.0–3.0)

## 2018-07-29 ENCOUNTER — Other Ambulatory Visit: Payer: Self-pay | Admitting: Internal Medicine

## 2018-07-29 ENCOUNTER — Other Ambulatory Visit: Payer: Self-pay | Admitting: Interventional Cardiology

## 2018-08-09 ENCOUNTER — Other Ambulatory Visit: Payer: Self-pay | Admitting: Internal Medicine

## 2018-08-09 DIAGNOSIS — I4891 Unspecified atrial fibrillation: Secondary | ICD-10-CM

## 2018-08-09 DIAGNOSIS — Z79899 Other long term (current) drug therapy: Secondary | ICD-10-CM

## 2018-08-13 ENCOUNTER — Ambulatory Visit (INDEPENDENT_AMBULATORY_CARE_PROVIDER_SITE_OTHER): Payer: Medicare Other | Admitting: Pharmacist

## 2018-08-13 ENCOUNTER — Other Ambulatory Visit: Payer: Self-pay

## 2018-08-13 DIAGNOSIS — Z0001 Encounter for general adult medical examination with abnormal findings: Secondary | ICD-10-CM

## 2018-08-13 DIAGNOSIS — Z8673 Personal history of transient ischemic attack (TIA), and cerebral infarction without residual deficits: Secondary | ICD-10-CM

## 2018-08-13 DIAGNOSIS — I4891 Unspecified atrial fibrillation: Secondary | ICD-10-CM

## 2018-08-13 LAB — POCT INR: INR: 2.8 (ref 2.0–3.0)

## 2018-08-13 NOTE — Patient Instructions (Signed)
Description   Continue taking 2mg  daily except 3mg  on Mondays and Fridays. Recheck INR in 6 week.  Call us at [facility] # [phone], Main # [phone].

## 2018-08-14 ENCOUNTER — Telehealth: Payer: Self-pay | Admitting: *Deleted

## 2018-08-14 NOTE — Telephone Encounter (Signed)
Records faxed to [employer]att[employer]-Iris Valdes/[employer] Reviewer.  Fax #[phone].

## 2018-08-26 ENCOUNTER — Ambulatory Visit: Payer: Self-pay | Admitting: Adult Health

## 2018-09-02 ENCOUNTER — Other Ambulatory Visit: Payer: Self-pay | Admitting: Adult Health

## 2018-09-02 ENCOUNTER — Telehealth: Payer: Self-pay

## 2018-09-02 DIAGNOSIS — Z9181 History of falling: Secondary | ICD-10-CM

## 2018-09-02 NOTE — Telephone Encounter (Signed)
Son states that patient is falling a lot trying to get to the bathroom. [facility] is recommends getting a 3 & 1 potty seat.  Also, patient had to push her Coumadin appointment out due to Covid-19 until the end of April. Rosanne Ashing concerned maybe her levels are off causing her to fall more. Would like to know if we could send an order to [facility] to check her levels. Please advise.

## 2018-09-02 NOTE — Telephone Encounter (Signed)
[name] notified and is aware that Commode was ordered and he will contact the [facility] to do the ordering for Home Health to check her levels.

## 2018-09-02 NOTE — Telephone Encounter (Signed)
LMTCB

## 2018-09-04 DIAGNOSIS — K56609 Unspecified intestinal obstruction, unspecified as to partial versus complete obstruction: Secondary | ICD-10-CM | POA: Diagnosis not present

## 2018-09-04 DIAGNOSIS — I824Z1 Acute embolism and thrombosis of unspecified deep veins of right distal lower extremity: Secondary | ICD-10-CM | POA: Diagnosis not present

## 2018-09-04 DIAGNOSIS — E43 Unspecified severe protein-calorie malnutrition: Secondary | ICD-10-CM | POA: Diagnosis not present

## 2018-09-23 ENCOUNTER — Telehealth: Payer: Self-pay

## 2018-09-23 NOTE — Telephone Encounter (Signed)

## 2018-09-24 ENCOUNTER — Ambulatory Visit (INDEPENDENT_AMBULATORY_CARE_PROVIDER_SITE_OTHER): Payer: Medicare Other | Admitting: Pharmacist

## 2018-09-24 ENCOUNTER — Other Ambulatory Visit: Payer: Self-pay

## 2018-09-24 DIAGNOSIS — Z8673 Personal history of transient ischemic attack (TIA), and cerebral infarction without residual deficits: Secondary | ICD-10-CM

## 2018-09-24 DIAGNOSIS — Z79899 Other long term (current) drug therapy: Secondary | ICD-10-CM | POA: Diagnosis not present

## 2018-09-24 DIAGNOSIS — I4891 Unspecified atrial fibrillation: Secondary | ICD-10-CM | POA: Diagnosis not present

## 2018-09-24 DIAGNOSIS — Z0001 Encounter for general adult medical examination with abnormal findings: Secondary | ICD-10-CM

## 2018-09-24 LAB — POCT INR: INR: 3.6 — AB (ref 2.0–3.0)

## 2018-09-25 DIAGNOSIS — R7309 Other abnormal glucose: Secondary | ICD-10-CM | POA: Insufficient documentation

## 2018-09-25 NOTE — Progress Notes (Signed)
FOLLOW UP  Assessment and Plan:    Diastolic congestive heart failure, unspecified HF chronicity (HCC) Weights down/stable; no signs of fluid overload  Atrial fibrillation, unspecified type [facility]) Managed via coumadin clinic Discussed if patient falls to immediately contact office or go to ER. Patient understands to call the office before starting a new medication.  Protein-calorie malnutrition, severe Discussed high protein/calorie diet, discussed ensure/boost daily, samples given - CMP/GFR  Hypertension Well controlled with current medications  Monitor blood pressure at home; patient to call if consistently greater than 130/80 Continue DASH diet.   Reminder to go to the ER if any CP, SOB, nausea, dizziness, severe HA, changes vision/speech, left arm numbness and tingling and jaw pain.  Cholesterol Currently with mild elevations; not aggressively treated due to age Continue low cholesterol diet and exercise.  Check lipid panel.   Other abnormal glucose  Continue diet and exercise.  Perform daily foot/skin check, notify office of any concerning changes.  Check A1C  Bmi 20  Continue to recommend diet heavy in fruits and veggies and low in animal meats, cheeses, and dairy products, appropriate calorie intake Discuss exercise recommendations routinely Continue to monitor weight at each visit  Vitamin D Def Near goal at last visit; continue supplementation to maintain goal of 60-100 Defer Vit D level   Continue diet and meds as discussed. Further disposition pending results of labs. Discussed med's effects and SE's.   Over 30 minutes of exam, counseling, chart review, and critical decision making was performed.   Future Appointments  Date Time Provider Department Center  11/05/2018 11:00 AM [facility]  11/26/2018  3:30 PM Lucky Cowboy, MD [facility] None  02/26/2019  3:00 PM Judd Gaudier, NP [facility] None  06/12/2019  9:00  AM Lucky Cowboy, MD [facility] None    ----------------------------------------------------------------------------------------------------------------------  HPI 85 y.o. female  presents for 3 month follow up on hypertension, cholesterol, diabetes, weight and vitamin D deficiency. She is here accompanied by her son who drives her to all appointments.   She has afib, on coumadin managed by a. Fib clinic, had recent visit on Tuesday.   She has history of CVA with poor ST recall and dysomnia. Follows with cardiology, is moderate-high fall risk, poor compliance with walker use.  She has history of diastolic heart failure is on lasix and weight is stable.   She is at [employer], private nurse comes by Mon-Fri, she is having difficulty bathing by herself, cannot get compression hose on/off, struggles with medication and remembering to take despite assistance/reminders by family.  She has hiatal hernia, contributes to frequent dysphagia/nausea/emesis and has had multiple hospitalizations; poor surgical candidate doing conservative interventions only. She has chronic diarrhea, well controlled with imodium 2 mg daily.   She has anxiety, is prescribed low dose seroquel at night and doing well with this.   BMI is Body mass index is 20.41 kg/m., she has not been working on diet and exercise, appetite has been down since stuck in room with covid 19. She has ensure provided but admits she's not drinking very regularly.  Wt Readings from Last 3 Encounters:  09/26/18 111 lb 9.6 oz (50.6 kg)  07/09/18 113 lb (51.3 kg)  06/20/18 115 lb 4.8 oz (52.3 kg)   Her blood pressure has been controlled at home, today their BP is BP: 122/72  She does workout, walks 1/4 mile 2-3 times a day. She denies chest pain, shortness of breath, dizziness.   She is not on cholesterol  medication secondary to mild elevations and advanced age and denies myalgias. Her cholesterol is not at goal. The cholesterol last visit  was:   Lab Results  Component Value Date   CHOL 187 02/11/2018   HDL 50 (L) 02/11/2018   LDLCALC 110 (H) 02/11/2018   TRIG 156 (H) 02/11/2018   CHOLHDL 3.7 02/11/2018    She has not been working on diet and exercise for prediabetes, and denies increased appetite, nausea, paresthesia of the feet, polydipsia, polyuria, visual disturbances and vomiting. Last A1C in the office was:  Lab Results  Component Value Date   HGBA1C 6.0 (H) 05/27/2018   Patient is on Vitamin D supplement.   Lab Results  Component Value Date   VD25OH 45 05/27/2018        Current Medications:  Current Outpatient Medications on File Prior to Visit  Medication Sig  . Cholecalciferol (VITAMIN D) 2000 UNITS CAPS Take 5,000 Units by mouth daily.   Marland Kitchen CINNAMON PO Take 1 capsule by mouth daily.  Marland Kitchen dicyclomine (BENTYL) 20 MG tablet Take 1 tablet 3 x day if needed for nausea, bloating, cramping or diarrhea (Patient taking differently: Take 20 mg by mouth 3 (three) times daily before meals. )  . feeding supplement, ENSURE ENLIVE, (ENSURE ENLIVE) LIQD Take 237 mLs by mouth 2 (two) times daily between meals.  . furosemide (LASIX) 20 MG tablet TAKE 1 TABLET EACH DAY.  Marland Kitchen loperamide (IMODIUM A-D) 2 MG tablet Take 1-2 tabs 3 times a day as needed for loose stools.  Marland Kitchen losartan (COZAAR) 50 MG tablet TAKE 1/2 TO 1 TABLET ONCE A DAY. (Patient taking differently: Take 50 mg by mouth daily. )  . Magnesium Hydroxide (MAGNESIA PO) Take 250 mg by mouth daily.   . metoprolol tartrate (LOPRESSOR) 25 MG tablet Take 1/2 tablet 2 x /day for BP  . pantoprazole (PROTONIX) 40 MG tablet Take 1 tablet daily for Heartburn & Acid Indigestion  . potassium chloride (K-DUR) 10 MEQ tablet TAKE 1 TABLET BY MOUTH DAILY.  Marland Kitchen QUEtiapine (SEROQUEL) 25 MG tablet Take 0.5 tablets (12.5 mg total) by mouth at bedtime.  Marland Kitchen warfarin (COUMADIN) 2 MG tablet TAKE AS DIRECTED BY ANTICOAGULATION CLINIC.  Marland Kitchen ondansetron (ZOFRAN ODT) 4 MG disintegrating tablet Dissolve 1  tablet under tongue every 6 to 8 hours if needed for nausea & / or vomitting (Patient not taking: Reported on 09/26/2018)  . ranitidine (ZANTAC) 300 MG tablet Take 300 mg by mouth daily.   No current facility-administered medications on file prior to visit.      Allergies:  Allergies  Allergen Reactions  . Ace Inhibitors Other (See Comments)    Cough  . Fosamax [Alendronate Sodium] Other (See Comments)    Heart burn  . Norvasc [Amlodipine Besylate] Other (See Comments)    edema  . Zocor [Simvastatin] Other (See Comments)    Elevated CPK     Medical History:  Past Medical History:  Diagnosis Date  . Anxiety   . Aphasia as late effect of cerebrovascular accident   . Atrial fibrillation (HCC)   . CKD (chronic kidney disease), stage II   . CVA (cerebral infarction)   . Diabetes mellitus type II   . GERD (gastroesophageal reflux disease)   . Hiatal hernia   . HTN (hypertension)   . Hyperlipidemia   . IBS (irritable bowel syndrome)   . Stroke (HCC)   . Vitamin D deficiency    Family history- Reviewed and unchanged Social history- Reviewed and unchanged  Review of Systems:  Review of Systems  Constitutional: Negative for malaise/fatigue and weight loss.  HENT: Negative for hearing loss and tinnitus.   Eyes: Negative for blurred vision and double vision.  Respiratory: Negative for cough, shortness of breath and wheezing.   Cardiovascular: Negative for chest pain, palpitations, orthopnea, claudication and leg swelling.  Gastrointestinal: Negative for abdominal pain, blood in stool, constipation, diarrhea, heartburn, melena, nausea and vomiting.  Genitourinary: Negative.   Musculoskeletal: Negative for joint pain and myalgias.  Skin: Negative for rash.  Neurological: Negative for dizziness, tingling, sensory change, weakness and headaches.  Endo/Heme/Allergies: Negative for polydipsia.  Psychiatric/Behavioral: Negative.   All other systems reviewed and are  negative.    Physical Exam: BP 122/72   Pulse 69   Temp (!) 97.5 F (36.4 C)   Wt 111 lb 9.6 oz (50.6 kg)   SpO2 97%   BMI 20.41 kg/m  Wt Readings from Last 3 Encounters:  09/26/18 111 lb 9.6 oz (50.6 kg)  07/09/18 113 lb (51.3 kg)  06/20/18 115 lb 4.8 oz (52.3 kg)   General Appearance: Well nourished, in no apparent distress. Eyes: PERRLA, EOMs, conjunctiva no swelling or erythema Sinuses: No Frontal/maxillary tenderness ENT/Mouth: Ext aud canals clear, TMs without erythema, bulging. No erythema, swelling, or exudate on post pharynx.  Tonsils not swollen or erythematous. Hearing normal.  Neck: Supple, thyroid normal.  Respiratory: Respiratory effort normal, BS equal bilaterally without rales, rhonchi, wheezing or stridor.  Cardio: RRR with no MRGs. Brisk peripheral pulses without edema.  Abdomen: Soft, + BS.  Non tender, no guarding, rebound, hernias, masses. Lymphatics: Non tender without lymphadenopathy.  Musculoskeletal: Full ROM, 5/5 strength, slow stable gait Skin: Warm, dry without rashes, lesions, ecchymosis.  Neuro: Cranial nerves intact. No cerebellar symptoms.  Psych: Awake and oriented X 3, normal affect, Insight and Judgment appropriate.    Dan MakerAshley C Haleema Vanderheyden, NP 2:09 PM [facility]

## 2018-09-26 ENCOUNTER — Ambulatory Visit: Payer: Self-pay | Admitting: Adult Health

## 2018-09-26 ENCOUNTER — Other Ambulatory Visit: Payer: Self-pay

## 2018-09-26 ENCOUNTER — Ambulatory Visit: Payer: Medicare Other | Admitting: Adult Health

## 2018-09-26 ENCOUNTER — Encounter: Payer: Self-pay | Admitting: Adult Health

## 2018-09-26 VITALS — BP 122/72 | HR 69 | Temp 97.5°F | Wt 111.6 lb

## 2018-09-26 DIAGNOSIS — E559 Vitamin D deficiency, unspecified: Secondary | ICD-10-CM

## 2018-09-26 DIAGNOSIS — I4891 Unspecified atrial fibrillation: Secondary | ICD-10-CM | POA: Diagnosis not present

## 2018-09-26 DIAGNOSIS — Z6820 Body mass index (BMI) 20.0-20.9, adult: Secondary | ICD-10-CM

## 2018-09-26 DIAGNOSIS — N182 Chronic kidney disease, stage 2 (mild): Secondary | ICD-10-CM

## 2018-09-26 DIAGNOSIS — I69920 Aphasia following unspecified cerebrovascular disease: Secondary | ICD-10-CM

## 2018-09-26 DIAGNOSIS — I5030 Unspecified diastolic (congestive) heart failure: Secondary | ICD-10-CM | POA: Diagnosis not present

## 2018-09-26 DIAGNOSIS — I10 Essential (primary) hypertension: Secondary | ICD-10-CM | POA: Diagnosis not present

## 2018-09-26 DIAGNOSIS — R7309 Other abnormal glucose: Secondary | ICD-10-CM

## 2018-09-26 DIAGNOSIS — E1122 Type 2 diabetes mellitus with diabetic chronic kidney disease: Secondary | ICD-10-CM | POA: Diagnosis not present

## 2018-09-26 DIAGNOSIS — I824Z1 Acute embolism and thrombosis of unspecified deep veins of right distal lower extremity: Secondary | ICD-10-CM

## 2018-09-26 DIAGNOSIS — F411 Generalized anxiety disorder: Secondary | ICD-10-CM

## 2018-09-26 DIAGNOSIS — E782 Mixed hyperlipidemia: Secondary | ICD-10-CM

## 2018-09-26 DIAGNOSIS — E43 Unspecified severe protein-calorie malnutrition: Secondary | ICD-10-CM

## 2018-09-26 NOTE — Patient Instructions (Addendum)
Goals    . Blood Pressure < 140/90      Drink ensure or boost daily to help maintain weight    High-Protein and High-Calorie Diet Eating high-protein and high-calorie foods can help you to gain weight, heal after an injury, and recover after an illness or surgery. The specific amount of daily protein and calories you need depends on:  Your body weight.  The reason this diet is recommended for you. What is my plan? Generally, a high-protein, high-calorie diet involves:  Eating 250-500 extra calories each day.  Making sure that you get enough of your daily calories from protein. Ask your health care provider how many of your calories should come from protein. Talk with a health care provider, such as a diet and nutrition specialist (dietitian), about how much protein and how many calories you need each day. Follow the diet as directed by your health care provider. What are tips for following this plan?  Preparing meals  Add whole milk, half-and-half, or heavy cream to cereal, pudding, soup, or hot cocoa.  Add whole milk to instant breakfast drinks.  Add peanut butter to oatmeal or smoothies.  Add powdered milk to baked goods, smoothies, or milkshakes.  Add powdered milk, cream, or butter to mashed potatoes.  Add cheese to cooked vegetables.  Make whole-milk yogurt parfaits. Top them with granola, fruit, or nuts.  Add cottage cheese to your fruit.  Add avocado, cheese, or both to sandwiches or salads.  Add meat, poultry, or seafood to rice, pasta, casseroles, salads, and soups.  Use mayonnaise when making egg salad, chicken salad, or tuna salad.  Use peanut butter as a dip for vegetables or as a topping for pretzels, celery, or crackers.  Add beans to casseroles, dips, and spreads.  Add pureed beans to sauces and soups.  Replace calorie-free drinks with calorie-containing drinks, such as milk and fruit juice.  Replace water with milk or heavy cream when making  foods such as oatmeal, pudding, or cocoa. General instructions  Ask your health care provider if you should take a nutritional supplement.  Try to eat six small meals each day instead of three large meals.  Eat a balanced diet. In each meal, include one food that is high in protein.  Keep nutritious snacks available, such as nuts, trail mixes, dried fruit, and yogurt.  If you have kidney disease or diabetes, talk with your health care provider about how much protein is safe for you. Too much protein may put extra stress on your kidneys.  Drink your calories. Choose high-calorie drinks and have them after your meals. What high-protein foods should I eat?  Vegetables Soybeans. Peas. Grains Quinoa. Bulgur wheat. Meats and other proteins Beef, pork, and poultry. Fish and seafood. Eggs. Tofu. Textured vegetable protein (TVP). Peanut butter. Nuts and seeds. Dried beans. Protein powders. Dairy Whole milk. Whole-milk yogurt. Powdered milk. Cheese. [employer]. Eggnog. Beverages High-protein supplement drinks. Soy milk. Other foods Protein bars. The items listed above may not be a complete list of high-protein foods and beverages. Contact a dietitian for more options. What high-calorie foods should I eat? Fruits Dried fruit. Fruit leather. Canned fruit in syrup. Fruit juice. Avocado. Vegetables Vegetables cooked in oil or butter. Fried potatoes. Grains Pasta. Quick breads. Muffins. Pancakes. Ready-to-eat cereal. Meats and other proteins Peanut butter. Nuts and seeds. Dairy Heavy cream. Whipped cream. Cream cheese. Sour cream. Ice cream. Custard. Pudding. Beverages Meal-replacement beverages. Nutrition shakes. Fruit juice. Sugar-sweetened soft drinks. Seasonings and condiments Salad  dressing. Mayonnaise. Alfredo sauce. Fruit preserves or jelly. Honey. Syrup. Sweets and desserts Cake. Cookies. Pie. Pastries. Candy bars. Chocolate. Fats and oils Butter or margarine. Oil.  Gravy. Other foods Meal-replacement bars. The items listed above may not be a complete list of high-calorie foods and beverages. Contact a dietitian for more options. Summary  A high-protein, high-calorie diet can help you gain weight or heal faster after an injury, illness, or surgery.  To increase your protein and calories, add ingredients such as whole milk, peanut butter, cheese, beans, meat, or seafood to meal items.  To get enough extra calories each day, include high-calorie foods and beverages at each meal.  Adding a high-calorie drink or shake can be an easy way to help you get enough calories each day. Talk with your healthcare provider or dietitian about the best options for you. This information is not intended to replace advice given to you by your health care provider. Make sure you discuss any questions you have with your health care provider. Document Released: 05/15/2005 Document Revised: 03/27/2017 Document Reviewed: 03/27/2017 Elsevier Interactive Patient Education  2019 [employer]

## 2018-09-27 ENCOUNTER — Other Ambulatory Visit: Payer: Self-pay | Admitting: Adult Health

## 2018-09-27 DIAGNOSIS — N289 Disorder of kidney and ureter, unspecified: Secondary | ICD-10-CM

## 2018-09-27 LAB — COMPLETE METABOLIC PANEL WITH GFR
AG Ratio: 1.4 (calc) (ref 1.0–2.5)
ALT: 15 U/L (ref 6–29)
AST: 20 U/L (ref 10–35)
Albumin: 4 g/dL (ref 3.6–5.1)
Alkaline phosphatase (APISO): 101 U/L (ref 37–153)
BUN/Creatinine Ratio: 15 (calc) (ref 6–22)
BUN: 16 mg/dL (ref 7–25)
CO2: 30 mmol/L (ref 20–32)
Calcium: 9.6 mg/dL (ref 8.6–10.4)
Chloride: 101 mmol/L (ref 98–110)
Creat: 1.06 mg/dL — ABNORMAL HIGH (ref 0.60–0.88)
GFR, Est African American: 53 mL/min/{1.73_m2} — ABNORMAL LOW (ref >=60)
GFR, Est Non African American: 46 mL/min/{1.73_m2} — ABNORMAL LOW (ref >=60)
Globulin: 2.8 g/dL (calc) (ref 1.9–3.7)
Glucose, Bld: 119 mg/dL — ABNORMAL HIGH (ref 65–99)
Potassium: 4.4 mmol/L (ref 3.5–5.3)
Sodium: 140 mmol/L (ref 135–146)
Total Bilirubin: 0.9 mg/dL (ref 0.2–1.2)
Total Protein: 6.8 g/dL (ref 6.1–8.1)

## 2018-09-27 LAB — CBC WITH DIFFERENTIAL/PLATELET
Absolute Monocytes: 562 cells/uL (ref 200–950)
Basophils Absolute: 50 cells/uL (ref 0–200)
Basophils Relative: 0.7 %
Eosinophils Absolute: 79 cells/uL (ref 15–500)
Eosinophils Relative: 1.1 %
HCT: 42.5 % (ref 35.0–45.0)
Hemoglobin: 14.3 g/dL (ref 11.7–15.5)
Lymphs Abs: 3269 cells/uL (ref 850–3900)
MCH: 30.6 pg (ref 27.0–33.0)
MCHC: 33.6 g/dL (ref 32.0–36.0)
MCV: 90.8 fL (ref 80.0–100.0)
MPV: 10.7 fL (ref 7.5–12.5)
Monocytes Relative: 7.8 %
Neutro Abs: 3240 cells/uL (ref 1500–7800)
Neutrophils Relative %: 45 %
Platelets: 222 10*3/uL (ref 140–400)
RBC: 4.68 10*6/uL (ref 3.80–5.10)
RDW: 12.8 % (ref 11.0–15.0)
Total Lymphocyte: 45.4 %
WBC: 7.2 10*3/uL (ref 3.8–10.8)

## 2018-09-27 LAB — HEMOGLOBIN A1C
Hgb A1c MFr Bld: 6.2 % of total Hgb — ABNORMAL HIGH (ref <5.7)
Mean Plasma Glucose: 131 (calc)
eAG (mmol/L): 7.3 (calc)

## 2018-09-27 LAB — TSH: TSH: 0.88 mIU/L (ref 0.40–4.50)

## 2018-09-27 LAB — LIPID PANEL
Cholesterol: 208 mg/dL — ABNORMAL HIGH (ref <200)
HDL: 56 mg/dL (ref >=50)
LDL Cholesterol (Calc): 125 mg/dL (calc) — ABNORMAL HIGH
Non-HDL Cholesterol (Calc): 152 mg/dL (calc) — ABNORMAL HIGH (ref <130)
Total CHOL/HDL Ratio: 3.7 (calc) (ref <5.0)
Triglycerides: 156 mg/dL — ABNORMAL HIGH (ref <150)

## 2018-09-27 LAB — MAGNESIUM: Magnesium: 1.8 mg/dL (ref 1.5–2.5)

## 2018-10-09 ENCOUNTER — Other Ambulatory Visit: Payer: Self-pay | Admitting: Internal Medicine

## 2018-10-09 DIAGNOSIS — I10 Essential (primary) hypertension: Secondary | ICD-10-CM

## 2018-10-09 DIAGNOSIS — F5101 Primary insomnia: Secondary | ICD-10-CM

## 2018-10-09 MED ORDER — QUETIAPINE FUMARATE 25 MG PO TABS: ORAL_TABLET | ORAL | 3 refills | Status: DC

## 2018-10-10 ENCOUNTER — Other Ambulatory Visit: Payer: Self-pay | Admitting: *Deleted

## 2018-10-10 MED ORDER — WARFARIN SODIUM 2 MG PO TABS: ORAL_TABLET | ORAL | 1 refills | Status: DC

## 2018-10-29 ENCOUNTER — Ambulatory Visit: Payer: Medicare Other | Admitting: *Deleted

## 2018-10-29 ENCOUNTER — Other Ambulatory Visit: Payer: Self-pay

## 2018-10-29 DIAGNOSIS — N289 Disorder of kidney and ureter, unspecified: Secondary | ICD-10-CM

## 2018-10-29 NOTE — Progress Notes (Signed)
Patient is here for a NV to check a BMP.  She states she is drinking more fluids and her son calls her twice a day to remind her to drink more and take her medications. She enjoys drinking sugar free tea and was encouraged to continue to drink.

## 2018-10-30 ENCOUNTER — Telehealth: Payer: Self-pay

## 2018-10-30 LAB — BASIC METABOLIC PANEL WITH GFR
BUN/Creatinine Ratio: 19 (calc) (ref 6–22)
BUN: 18 mg/dL (ref 7–25)
CO2: 30 mmol/L (ref 20–32)
Calcium: 10 mg/dL (ref 8.6–10.4)
Chloride: 102 mmol/L (ref 98–110)
Creat: 0.97 mg/dL — ABNORMAL HIGH (ref 0.60–0.88)
GFR, Est African American: 59 mL/min/{1.73_m2} — ABNORMAL LOW (ref >=60)
GFR, Est Non African American: 51 mL/min/{1.73_m2} — ABNORMAL LOW (ref >=60)
Glucose, Bld: 100 mg/dL — ABNORMAL HIGH (ref 65–99)
Potassium: 3.9 mmol/L (ref 3.5–5.3)
Sodium: 140 mmol/L (ref 135–146)

## 2018-10-30 NOTE — Telephone Encounter (Signed)

## 2018-11-04 ENCOUNTER — Other Ambulatory Visit: Payer: Self-pay | Admitting: Internal Medicine

## 2018-11-04 DIAGNOSIS — I10 Essential (primary) hypertension: Secondary | ICD-10-CM

## 2018-11-04 DIAGNOSIS — I5032 Chronic diastolic (congestive) heart failure: Secondary | ICD-10-CM

## 2018-11-05 ENCOUNTER — Ambulatory Visit (INDEPENDENT_AMBULATORY_CARE_PROVIDER_SITE_OTHER): Payer: Medicare Other | Admitting: *Deleted

## 2018-11-05 ENCOUNTER — Other Ambulatory Visit: Payer: Self-pay

## 2018-11-05 DIAGNOSIS — Z8673 Personal history of transient ischemic attack (TIA), and cerebral infarction without residual deficits: Secondary | ICD-10-CM

## 2018-11-05 DIAGNOSIS — Z0001 Encounter for general adult medical examination with abnormal findings: Secondary | ICD-10-CM | POA: Diagnosis not present

## 2018-11-05 DIAGNOSIS — I4891 Unspecified atrial fibrillation: Secondary | ICD-10-CM | POA: Diagnosis not present

## 2018-11-05 LAB — POCT INR: INR: 3.4 — AB (ref 2.0–3.0)

## 2018-11-05 MED ORDER — WARFARIN SODIUM 2 MG PO TABS: ORAL_TABLET | ORAL | 1 refills | Status: DC

## 2018-11-05 NOTE — Patient Instructions (Addendum)
Description    Instructed patients son and pt to skip dose tomorrow (patient took today) then decrease dose to 2mg  daily except 3mg  on Mondays. Recheck INR in 3 weeks.  Call us at [facility] # [phone], Main # [phone].   [place] called and made aware of changes.

## 2018-11-19 ENCOUNTER — Telehealth: Payer: Self-pay

## 2018-11-19 NOTE — Telephone Encounter (Signed)

## 2018-11-26 ENCOUNTER — Ambulatory Visit: Payer: Medicare Other | Admitting: *Deleted

## 2018-11-26 ENCOUNTER — Other Ambulatory Visit: Payer: Self-pay

## 2018-11-26 ENCOUNTER — Ambulatory Visit: Payer: Self-pay | Admitting: Internal Medicine

## 2018-11-26 DIAGNOSIS — I4891 Unspecified atrial fibrillation: Secondary | ICD-10-CM | POA: Diagnosis not present

## 2018-11-26 DIAGNOSIS — Z79899 Other long term (current) drug therapy: Secondary | ICD-10-CM

## 2018-11-26 LAB — POCT INR: INR: 2.4 (ref 2.0–3.0)

## 2018-11-26 NOTE — Patient Instructions (Signed)
Description    Instructed patients son and pt to continue taking to 2mg  daily except 3mg  on Mondays. Recheck INR in 4 weeks.  Call us at [facility] # [phone], Main # [phone].

## 2018-12-24 ENCOUNTER — Telehealth: Payer: Self-pay | Admitting: Pharmacist

## 2018-12-24 NOTE — Telephone Encounter (Signed)

## 2018-12-25 ENCOUNTER — Ambulatory Visit (INDEPENDENT_AMBULATORY_CARE_PROVIDER_SITE_OTHER): Payer: Medicare Other

## 2018-12-25 ENCOUNTER — Other Ambulatory Visit: Payer: Self-pay

## 2018-12-25 DIAGNOSIS — Z8673 Personal history of transient ischemic attack (TIA), and cerebral infarction without residual deficits: Secondary | ICD-10-CM

## 2018-12-25 DIAGNOSIS — I4891 Unspecified atrial fibrillation: Secondary | ICD-10-CM | POA: Diagnosis not present

## 2018-12-25 DIAGNOSIS — Z0001 Encounter for general adult medical examination with abnormal findings: Secondary | ICD-10-CM

## 2018-12-25 LAB — POCT INR: INR: 2.5 (ref 2.0–3.0)

## 2018-12-25 NOTE — Patient Instructions (Signed)
Description    Instructed patients son and pt to continue taking to 2mg  daily except 3mg  on Mondays. Recheck INR in 5 weeks.  Call us at [facility] # [phone], Main # [phone].

## 2018-12-31 ENCOUNTER — Encounter: Payer: Self-pay | Admitting: Internal Medicine

## 2018-12-31 DIAGNOSIS — F0150 Vascular dementia without behavioral disturbance: Secondary | ICD-10-CM | POA: Insufficient documentation

## 2018-12-31 NOTE — Patient Instructions (Addendum)
Vit D  & Vit C 1,000 mg   are recommended to help protect  against the Covid-19 and other Corona viruses.    Also it's recommended  to take  Zinc 50 mg  to help  protect against the Covid-19   and best place to get  is also on [employer]  and don't pay more than 6-8 cents /pill !   ===================================== Coronavirus (COVID-19) Are you at risk?  Are you at risk for the Coronavirus (COVID-19)?  To be considered HIGH RISK for Coronavirus (COVID-19), you have to meet the following criteria:  . Traveled to [place], [place], [place], [place] or [place]; or in the [place] to [place], [place], [place]  . or [place]; and have fever, cough, and shortness of breath within the last 2 weeks of travel OR . Been in close contact with a person diagnosed with COVID-19 within the last 2 weeks and have  . fever, cough,and shortness of breath .  . IF YOU DO NOT MEET THESE CRITERIA, YOU ARE CONSIDERED LOW RISK FOR COVID-19.  What to do if you are HIGH RISK for COVID-19?  . If you are having a medical emergency, call 911. . Seek medical care right away. Before you go to a doctor's office, urgent care or emergency department, .  call ahead and tell them about your recent travel, contact with someone diagnosed with COVID-19  .  and your symptoms.  . You should receive instructions from your physician's office regarding next steps of care.  . When you arrive at healthcare provider, tell the healthcare staff immediately you have returned from  . visiting [place], [place], [place], [place] or [place]; or traveled in the [place] to [place], [place],  . [place] or [place] in the last two weeks or you have been in close contact with a person diagnosed with  . COVID-19 in the last 2 weeks.   . Tell the health care staff about your symptoms: fever, cough and shortness of breath. . After you have been seen by a medical provider, you will be either: o Tested for  (COVID-19) and discharged home on quarantine except to seek medical care if  o symptoms worsen, and asked to  - Stay home and avoid contact with others until you get your results (4-5 days)  - Avoid travel on public transportation if possible (such as bus, train, or airplane) or o Sent to the Emergency Department by EMS for evaluation, COVID-19 testing  and  o possible admission depending on your condition and test results.  What to do if you are LOW RISK for COVID-19?  Reduce your risk of any infection by using the same precautions used for avoiding the common cold or flu:  . Wash your hands often with soap and warm water for at least 20 seconds.  If soap and water are not readily available,  . use an alcohol-based hand sanitizer with at least 60% alcohol.  . If coughing or sneezing, cover your mouth and nose by coughing or sneezing into the elbow areas of your shirt or coat, .  into a tissue or into your sleeve (not your hands). . Avoid shaking hands with others and consider head nods or verbal greetings only. . Avoid touching your eyes, nose, or mouth with unwashed hands.  . Avoid close contact with people who are sick. . Avoid places or events with large numbers of people in one location, like concerts or sporting events. .   Carefully consider travel plans you have or are making. . If you are planning any travel outside or inside the [place], visit the CDC's Travelers' Health webpage for the latest health notices. . If you have some symptoms but not all symptoms, continue to monitor at home and seek medical attention  . if your symptoms worsen. . If you are having a medical emergency, call 911.   ++++++++++++++++++++++++++++++++ Recommend Adult Low Dose Aspirin or  coated  Aspirin 81 mg daily  To reduce risk of Colon Cancer 40 %,  Skin Cancer 26 % ,  Melanoma 46%  and  Pancreatic cancer 60% ++++++++++++++++++++++++++++++++ Vitamin D goal  is between 70-100.  Please make sure that  you are taking your Vitamin D as directed.  It is very important as a natural anti-inflammatory  helping hair, skin, and nails, as well as reducing stroke and heart attack risk.  It helps your bones and helps with mood. It also decreases numerous cancer risks so please take it as directed.  Low Vit D is associated with a 200-300% higher risk for CANCER  and 200-300% higher risk for HEART   ATTACK  &  STROKE.   ...................................... It is also associated with higher death rate at younger ages,  autoimmune diseases like Rheumatoid arthritis, Lupus, Multiple Sclerosis.    Also many other serious conditions, like depression, Alzheimer's Dementia, infertility, muscle aches, fatigue, fibromyalgia - just to name a few. ++++++++++++++++++++ Recommend the book "The END of DIETING" by Dr Joel Fuhrman  & the book "The END of DIABETES " by Dr Joel Fuhrman At [employer].com - get book & Audio CD's    Being diabetic has a  300% increased risk for heart attack, stroke, cancer, and alzheimer- type vascular dementia. It is very important that you work harder with diet by avoiding all foods that are white. Avoid white rice (brown & wild rice is OK), white potatoes (sweetpotatoes in moderation is OK), White bread or wheat bread or anything made out of white flour like bagels, donuts, rolls, buns, biscuits, cakes, pastries, cookies, pizza crust, and pasta (made from white flour & egg whites) - vegetarian pasta or spinach or wheat pasta is OK. Multigrain breads like Arnold's or [employer], or multigrain sandwich thins or flatbreads.  Diet, exercise and weight loss can reverse and cure diabetes in the early stages.  Diet, exercise and weight loss is very important in the control and prevention of complications of diabetes which affects every system in your body, ie. Brain - dementia/stroke, eyes - glaucoma/blindness, heart - heart attack/heart failure, kidneys - dialysis, stomach - gastric paralysis,  intestines - malabsorption, nerves - severe painful neuritis, circulation - gangrene & loss of a leg(s), and finally cancer and Alzheimers.    I recommend avoid fried & greasy foods,  sweets/candy, white rice (brown or wild rice or Quinoa is OK), white potatoes (sweet potatoes are OK) - anything made from white flour - bagels, doughnuts, rolls, buns, biscuits,white and wheat breads, pizza crust and traditional pasta made of white flour & egg white(vegetarian pasta or spinach or wheat pasta is OK).  Multi-grain bread is OK - like multi-grain flat bread or sandwich thins. Avoid alcohol in excess. Exercise is also important.    Eat all the vegetables you want - avoid meat, especially red meat and dairy - especially cheese.  Cheese is the most concentrated form of trans-fats which is the worst thing to clog up our arteries. Veggie cheese is OK which can be   found in the fresh produce section at [employer] or [employer] or [employer]  +++++++++++++++++++++ DASH Eating Plan  DASH stands for "Dietary Approaches to Stop Hypertension."   The DASH eating plan is a healthy eating plan that has been shown to reduce high blood pressure (hypertension). Additional health benefits may include reducing the risk of type 2 diabetes mellitus, heart disease, and stroke. The DASH eating plan may also help with weight loss. WHAT DO I NEED TO KNOW ABOUT THE DASH EATING PLAN? For the DASH eating plan, you will follow these general guidelines:  Choose foods with a percent daily value for sodium of less than 5% (as listed on the food label).  Use salt-free seasonings or herbs instead of table salt or sea salt.  Check with your health care provider or pharmacist before using salt substitutes.  Eat lower-sodium products, often labeled as "lower sodium" or "no salt added."  Eat fresh foods.  Eat more vegetables, fruits, and low-fat dairy products.  Choose whole grains. Look for the word "whole" as the first word in  the ingredient list.  Choose fish   Limit sweets, desserts, sugars, and sugary drinks.  Choose heart-healthy fats.  Eat veggie cheese   Eat more home-cooked food and less restaurant, buffet, and fast food.  Limit fried foods.  Cook foods using methods other than frying.  Limit canned vegetables. If you do use them, rinse them well to decrease the sodium.  When eating at a restaurant, ask that your food be prepared with less salt, or no salt if possible.                      WHAT FOODS CAN I EAT? Read Dr Joel Fuhrman's books on The End of Dieting & The End of Diabetes  Grains Whole grain or whole wheat bread. Brown rice. Whole grain or whole wheat pasta. Quinoa, bulgur, and whole grain cereals. Low-sodium cereals. Corn or whole wheat flour tortillas. Whole grain cornbread. Whole grain crackers. Low-sodium crackers.  Vegetables Fresh or frozen vegetables (raw, steamed, roasted, or grilled). Low-sodium or reduced-sodium tomato and vegetable juices. Low-sodium or reduced-sodium tomato sauce and paste. Low-sodium or reduced-sodium canned vegetables.   Fruits All fresh, canned (in natural juice), or frozen fruits.  Protein Products  All fish and seafood.  Dried beans, peas, or lentils. Unsalted nuts and seeds. Unsalted canned beans.  Dairy Low-fat dairy products, such as skim or 1% milk, 2% or reduced-fat cheeses, low-fat ricotta or cottage cheese, or plain low-fat yogurt. Low-sodium or reduced-sodium cheeses.  Fats and Oils Tub margarines without trans fats. Light or reduced-fat mayonnaise and salad dressings (reduced sodium). Avocado. Safflower, olive, or canola oils. Natural peanut or almond butter.  Other Unsalted popcorn and pretzels. The items listed above may not be a complete list of recommended foods or beverages. Contact your dietitian for more options.  +++++++++++++++  WHAT FOODS ARE NOT RECOMMENDED? Grains/ White flour or wheat flour White bread. White pasta.  White rice. Refined cornbread. Bagels and croissants. Crackers that contain trans fat.  Vegetables  Creamed or fried vegetables. Vegetables in a . Regular canned vegetables. Regular canned tomato sauce and paste. Regular tomato and vegetable juices.  Fruits Dried fruits. Canned fruit in light or heavy syrup. Fruit juice.  Meat and Other Protein Products Meat in general - RED meat & White meat.  Fatty cuts of meat. Ribs, chicken wings, all processed meats as bacon, sausage, bologna, salami, fatback, hot dogs, bratwurst and packaged   luncheon meats.  Dairy Whole or 2% milk, cream, half-and-half, and cream cheese. Whole-fat or sweetened yogurt. Full-fat cheeses or blue cheese. Non-dairy creamers and whipped toppings. Processed cheese, cheese spreads, or cheese curds.  Condiments Onion and garlic salt, seasoned salt, table salt, and sea salt. Canned and packaged gravies. Worcestershire sauce. Tartar sauce. Barbecue sauce. Teriyaki sauce. Soy sauce, including reduced sodium. Steak sauce. Fish sauce. Oyster sauce. Cocktail sauce. Horseradish. Ketchup and mustard. Meat flavorings and tenderizers. Bouillon cubes. Hot sauce. Tabasco sauce. Marinades. Taco seasonings. Relishes.  Fats and Oils Butter, stick margarine, lard, shortening and bacon fat. Coconut, palm kernel, or palm oils. Regular salad dressings.  Pickles and olives. Salted popcorn and pretzels.  The items listed above may not be a complete list of foods and beverages to avoid. +++++++++++++++++++++++++++++++++   Bleeding Precautions When on Anticoagulant Therapy,   Anticoagulant therapy, also called blood thinner therapy, is medicine that helps to prevent and treat blood clots. The medicine works by stopping blood clots from forming or growing. Blood clots that form in your blood vessels can be dangerous. They can break loose and travel to the heart, lungs, or brain. This increases the risk of a heart attack, stroke, or blocked lung  artery (pulmonary embolism). Anticoagulants also increase the risk of bleeding. Try to protect yourself from cuts and other injuries that can cause bleeding. It is important to take anticoagulants exactly as told by your health care provider. Why do I need to be on anticoagulant therapy? You may need this medicine if you are at risk of developing a blood clot. Conditions that increase your risk of a blood clot include:  Being born with heart disease or a heart malformation (congenital heart disease).  Developing heart disease.  Having had surgery, such as valve replacement.  Having had a serious accident or other type of severe injury (trauma).  Having certain types of cancer.  Having certain diseases that can increase blood clotting.  Having a high risk of stroke or heart attack.  Having atrial fibrillation (AF). What are the common anticoagulant medicines? There are several types of anticoagulant medicines. The most common types are:  Medicines that you take by mouth (oral medicines), such as: ? Warfarin. ? Novel oral anticoagulants (NOACs), such as: ? Direct thrombin inhibitors (dabigatran). ? Factor Xa inhibitors (apixaban, edoxaban, and rivaroxaban).  Injections, such as: ? Unfractionated heparin. ? Low molecular weight heparin. These anticoagulants work in different ways to prevent blood clots. They also have different risks and side effects. What do I need to remember while on anticoagulant therapy? Taking anticoagulants  Take your medicine at the same time every day. If you forget to take your medicine, take it as soon as you remember. Do not double your dosage of medicine if you miss a whole day. Take your normal dose and call your health care provider.  Do not stop taking your medicine unless your health care provider approves. Stopping the medicine can increase your risk of developing a blood clot. Taking other medicines  Take over-the-counter and prescriptions  medicines only as told by your health care provider.  Do not take over-the-counter NSAIDs, including aspirin and ibuprofen, while you are on anticoagulant therapy. These medicines increase your risk of dangerous bleeding.  Get approval from your health care provider before you start taking any new medicines, vitamins, or herbal products. Some of these could interfere with your therapy. General instructions  Keep all follow-up visits as told by your health care provider. This is  important.  If you are pregnant or trying to get pregnant, talk with a health care provider about anticoagulants. Some of these medicines are not safe to take during pregnancy.  Tell all health care providers, including your dentist, that you are on anticoagulant therapy. It is especially important to tell providers before you have any surgery, medical procedures, or dental work done. What precautions should I take?   Be very careful when using knives, scissors, or other sharp objects.  Use an electric razor instead of a blade.  Do not use toothpicks.  Use a soft-bristled toothbrush. Brush your teeth gently.  Always wear shoes outdoors and wear slippers indoors.  Be careful when cutting your fingernails and toenails.  Place bath mats in the bathroom. If possible, install handrails as well.  Wear gloves while you do yard work.  Wear your seat belt.  Prevent falls by removing loose rugs and extension cords from areas where you walk. Use a cane or walker if you need it.  Avoid constipation by: ? Drinking enough fluid to keep your urine clear or pale yellow. ? Eating foods that are high in fiber, such as fresh fruits and vegetables, whole grains, and beans. ? Limiting foods that are high in fat and processed sugars, such as fried and sweet foods.  Do not play contact sports or participate in other activities that have a high risk for injury. What other precautions are important if on warfarin therapy? If  you are taking a type of anticoagulant called warfarin, make sure you:  Work with a diet and nutrition specialist (dietitian) to make an eating plan. Do not make any sudden changes to your diet after you have started your eating plan.  Do not drink alcohol. It can interfere with your medicine and increase your risk of an injury that causes bleeding.  Get regular blood tests as told by your health care provider. What are some questions to ask my health care provider?  Why do I need anticoagulant therapy?  What is the best anticoagulant therapy for my condition?  How long will I need anticoagulant therapy?  What are the side effects of anticoagulant therapy?  When should I take my medicine? What should I do if I forget to take it?  Will I need to have regular blood tests?  Do I need to change my diet? Are there foods or drinks that I should avoid?  What activities are safe for me?  What should I do if I want to get pregnant? Contact a health care provider if:  You miss a dose of medicine: ? And you are not sure what to do. ? For more than one day.  You have: ? Menstrual bleeding that is heavier than normal. ? Bloody or brown urine. ? Easy bruising. ? Black and tarry stool or bright red stool. ? Side effects from your medicine.  You feel weak or dizzy.  You become pregnant. Get help right away if:  You have bleeding that will not stop within 20 minutes from: ? The nose. ? The gums. ? A cut on the skin.  You have a severe headache or stomachache.  You vomit or cough up blood.  You fall or hit your head. Summary  Anticoagulant therapy, also called blood thinner therapy, is medicine that helps to prevent and treat blood clots.  Anticoagulants work in different ways to prevent blood clots. They also have different risks and side effects.  Talk with your health care provider about  any precautions that you should take while on anticoagulant therapy. This  information is not intended to replace advice given to you by your health care provider. Make sure you discuss any questions you have with your health care provider. Document Released: 04/26/2015 Document Revised: 09/04/2018 Document Reviewed: 08/01/2016 Elsevier Patient Education  [employer].

## 2018-12-31 NOTE — Progress Notes (Signed)
History of Present Illness:      This very nice 85 y.o. WWF presents for 6 month follow up with HTN, Vascular Dementia, ASCVD/TIA's, HLD, T2_DM   and Vitamin D Deficiency. Patient has GERD controlled w/Protonis and she also has hx/o IBS.       Patient is treated for HTN (1992) & BP has been controlled at home. Today's BP is at goal -  112/68. Patient is on Coumadin followed at [facility] for an Afib & anembolic CVA in 3875.  Patient has an unstable gait & is considered High Fall Risk for Coumadin. Patient has had no complaints of any cardiac type chest pain, palpitations, dyspnea / orthopnea / PND, dizziness, claudication, or dependent edema.      Hyperlipidemia is not controlled with diet & aggressive med Rx is deferred for advanced age. Lab Results  Component Value Date   CHOL 208 (H) 09/26/2018   HDL 56 09/26/2018   LDLCALC 125 (H) 09/26/2018   TRIG 156 (H) 09/26/2018   CHOLHDL 3.7 09/26/2018       Also, the patient has history of T2_NIDDM (2003)  W/CKD2 attempting control w/diet and she  has had no symptoms of reactive hypoglycemia, diabetic polys, paresthesias or visual blurring.  Last A1c was not at goal: Lab Results  Component Value Date   HGBA1C 6.2 (H) 09/26/2018       Further, the patient also has history of Vitamin D Deficiency ("35" / 2011) and supplements vitamin D without any suspected side-effects. Last vitamin D was still low (goal 70-100): Lab Results  Component Value Date   VD25OH 45 05/27/2018   Current Outpatient Medications on File Prior to Visit  Medication Sig  . Cholecalciferol (VITAMIN D) 2000 UNITS CAPS Take 5,000 Units by mouth daily.   Marland Kitchen CINNAMON PO Take 1 capsule by mouth daily.  Marland Kitchen dicyclomine (BENTYL) 20 MG tablet Take 1 tablet 3 x day if needed for nausea, bloating, cramping or diarrhea (Patient taking differently: Take 20 mg by mouth 3 (three) times daily before meals. )  . feeding supplement, ENSURE ENLIVE, (ENSURE ENLIVE) LIQD  Take 237 mLs by mouth 2 (two) times daily between meals.  . furosemide (LASIX) 20 MG tablet Take 1 tablet Daily for BP & Fluid  . loperamide (IMODIUM A-D) 2 MG tablet Take 1-2 tabs 3 times a day as needed for loose stools.  Marland Kitchen losartan (COZAAR) 50 MG tablet Take 1 tablet Daily for  BP  . Magnesium Hydroxide (MAGNESIA PO) Take 250 mg by mouth daily.   . metoprolol tartrate (LOPRESSOR) 25 MG tablet Take 1/2 tablet 2 x /day for BP  . ondansetron (ZOFRAN ODT) 4 MG disintegrating tablet Dissolve 1 tablet under tongue every 6 to 8 hours if needed for nausea & / or vomitting  . pantoprazole (PROTONIX) 40 MG tablet Take 1 tablet daily for Heartburn & Acid Indigestion  . potassium chloride (K-DUR) 10 MEQ tablet TAKE 1 TABLET BY MOUTH DAILY.  Marland Kitchen QUEtiapine (SEROQUEL) 25 MG tablet Take 1/2 tablet at Bedtime  . warfarin (COUMADIN) 2 MG tablet Take 1 tablet daily except 1.5 tabs on Mondays or as directed by the coumadin clinic.  . ranitidine (ZANTAC) 300 MG tablet Take 300 mg by mouth daily.   No current facility-administered medications on file prior to visit.    Allergies  Allergen Reactions  . Ace Inhibitors Other (See Comments)    Cough  . Fosamax [Alendronate Sodium] Other (See Comments)  Heart burn  . Norvasc [Amlodipine Besylate] Other (See Comments)    edema  . Zocor [Simvastatin] Other (See Comments)    Elevated CPK   PMHx:   Past Medical History:  Diagnosis Date  . Anxiety   . Aphasia as late effect of cerebrovascular accident   . Atrial fibrillation (HCC)   . CKD (chronic kidney disease), stage II   . CVA (cerebral infarction)   . Diabetes mellitus type II   . GERD (gastroesophageal reflux disease)   . Hiatal hernia   . HTN (hypertension)   . Hyperlipidemia   . IBS (irritable bowel syndrome)   . Stroke (HCC)   . Vitamin D deficiency    Immunization History  Administered Date(s) Administered  . DT 02/18/2015  . Influenza, High Dose Seasonal PF 03/05/2014, 02/18/2015,  03/16/2016, 03/05/2017  . Influenza-Unspecified 01/30/2013, 02/27/2018  . PPD Test 10/26/2017  . Pneumococcal-Unspecified 04/27/2004  . Td 06/28/2003  . Zoster 04/27/2009   Past Surgical History:  Procedure Laterality Date  . CATARACT EXTRACTION, BILATERAL    . CHOLECYSTECTOMY    . COLONOSCOPY  2005  . ESOPHAGOGASTRODUODENOSCOPY (EGD) WITH PROPOFOL N/A 11/28/2017   Procedure: ESOPHAGOGASTRODUODENOSCOPY (EGD) WITH PROPOFOL;  Surgeon: Graylin ShiverGanem, Salem F, MD;  [facility];  Service: Endoscopy;  Laterality: N/A;  . TONSILLECTOMY    . UPPER GASTROINTESTINAL ENDOSCOPY  2005   FHx:    Reviewed / unchanged  SHx:    Reviewed / unchanged   Systems Review:  Constitutional: Denies fever, chills, wt changes, headaches, insomnia, fatigue, night sweats, change in appetite. Eyes: Denies redness, blurred vision, diplopia, discharge, itchy, watery eyes.  ENT: Denies discharge, congestion, post nasal drip, epistaxis, sore throat, earache, hearing loss, dental pain, tinnitus, vertigo, sinus pain, snoring.  CV: Denies chest pain, palpitations, irregular heartbeat, syncope, dyspnea, diaphoresis, orthopnea, PND, claudication or edema. Respiratory: denies cough, dyspnea, DOE, pleurisy, hoarseness, laryngitis, wheezing.  Gastrointestinal: Denies dysphagia, odynophagia, heartburn, reflux, water brash, abdominal pain or cramps, nausea, vomiting, bloating, diarrhea, constipation, hematemesis, melena, hematochezia  or hemorrhoids. Genitourinary: Denies dysuria, frequency, urgency, nocturia, hesitancy, discharge, hematuria or flank pain. Musculoskeletal: Denies arthralgias, myalgias, stiffness, jt. swelling, pain, limping or strain/sprain.  Skin: Denies pruritus, rash, hives, warts, acne, eczema or change in skin lesion(s). Neuro: No weakness, tremor, incoordination, spasms, paresthesia or pain. Psychiatric: Denies confusion, memory loss or sensory loss. Endo: Denies change in weight, skin or hair change.   Heme/Lymph: No excessive bleeding, bruising or enlarged lymph nodes.  Physical Exam  BP 112/68   Pulse 68   Temp (!) 97.1 F (36.2 C)   Resp 18   Ht 5' 2.25" (1.581 m)   Wt 110 lb 12.8 oz (50.3 kg)   BMI 20.10 kg/m   Appears  well nourished, well groomed  and in no distress.  Eyes: PERRLA, EOMs, conjunctiva no swelling or erythema. Sinuses: No frontal/maxillary tenderness ENT/Mouth: EAC's clear, TM's nl w/o erythema, bulging. Nares clear w/o erythema, swelling, exudates. Oropharynx clear without erythema or exudates. Oral hygiene is good. Tongue normal, non obstructing. Hearing intact.  Neck: Supple. Thyroid not palpable. Car 2+/2+ without bruits, nodes or JVD. Chest: Respirations nl with BS clear & equal w/o rales, rhonchi, wheezing or stridor.  Cor: Heart sounds normal w/ regular rate and rhythm without sig. murmurs, gallops, clicks or rubs. Peripheral pulses normal and equal  without edema.  Abdomen: Soft & bowel sounds normal. Non-tender w/o guarding, rebound, hernias, masses or organomegaly.  Lymphatics: Unremarkable.  Musculoskeletal: Full ROM all peripheral extremities, joint  stability, 5/5 strength and normal gait.  Skin: Warm, dry without exposed rashes, lesions or ecchymosis apparent.  Neuro: Cranial nerves intact, reflexes equal bilaterally. Sensory-motor testing grossly intact. Tendon reflexes grossly intact.  Pysch: Alert & oriented x 3.  Insight and judgement nl & appropriate. No ideations.  Assessment and Plan:  1. Essential hypertension  - Continue medication, monitor blood pressure at home.  - Continue DASH diet.  Reminder to go to the ER if any CP,  SOB, nausea, dizziness, severe HA, changes vision/speech.  - CBC with Differential/Platelet - COMPLETE METABOLIC PANEL WITH GFR - Magnesium - TSH  2. Hyperlipidemia, mixed  - Continue diet & lifestyle modifications.   - TSH  3. Type 2 diabetes mellitus with stage 2 chronic kidney disease without  long-term current use of insulin (HCC)   - Continue diet, exercise  - Lifestyle modifications.  - Monitor appropriate labs.  - COMPLETE METABOLIC PANEL WITH GFR - Hemoglobin A1c  4. Vitamin D deficiency  - Continue supplementation.  - VITAMIN D 25 Hydroxyl  5. Chronic atrial fibrillation  - TSH  6. Vascular dementia without behavioral disturbance (HCC)  7. Medication management  - CBC with Differential/Platelet - COMPLETE METABOLIC PANEL WITH GFR - Magnesium - TSH - Hemoglobin A1c - VITAMIN D 25 Hydroxyl        Discussed  regular exercise, BP monitoring, weight control to achieve/maintain BMI less than 25 and discussed med and SE's. Recommended labs to assess and monitor clinical status with further disposition pending results of labs.  I discussed the assessment and treatment plan with the patient. The patient was provided an opportunity to ask questions and all were answered. The patient agreed with the plan and demonstrated an understanding of the instructions. I provided over 30 minutes of exam, counseling, chart review and  complex critical decision making.   Marinus MawWilliam D Graviel Payeur, MD

## 2019-01-01 ENCOUNTER — Other Ambulatory Visit: Payer: Self-pay

## 2019-01-01 ENCOUNTER — Ambulatory Visit (INDEPENDENT_AMBULATORY_CARE_PROVIDER_SITE_OTHER): Payer: Medicare Other | Admitting: Internal Medicine

## 2019-01-01 VITALS — BP 112/68 | HR 68 | Temp 97.1°F | Resp 18 | Ht 62.25 in | Wt 110.8 lb

## 2019-01-01 DIAGNOSIS — I10 Essential (primary) hypertension: Secondary | ICD-10-CM | POA: Diagnosis not present

## 2019-01-01 DIAGNOSIS — I482 Chronic atrial fibrillation, unspecified: Secondary | ICD-10-CM | POA: Diagnosis not present

## 2019-01-01 DIAGNOSIS — Z79899 Other long term (current) drug therapy: Secondary | ICD-10-CM

## 2019-01-01 DIAGNOSIS — E559 Vitamin D deficiency, unspecified: Secondary | ICD-10-CM

## 2019-01-01 DIAGNOSIS — E782 Mixed hyperlipidemia: Secondary | ICD-10-CM | POA: Diagnosis not present

## 2019-01-01 DIAGNOSIS — N182 Chronic kidney disease, stage 2 (mild): Secondary | ICD-10-CM

## 2019-01-01 DIAGNOSIS — E1122 Type 2 diabetes mellitus with diabetic chronic kidney disease: Secondary | ICD-10-CM | POA: Diagnosis not present

## 2019-01-01 DIAGNOSIS — F0150 Vascular dementia without behavioral disturbance: Secondary | ICD-10-CM

## 2019-01-02 LAB — CBC WITH DIFFERENTIAL/PLATELET
Absolute Monocytes: 562 cells/uL (ref 200–950)
Basophils Absolute: 52 cells/uL (ref 0–200)
Basophils Relative: 0.7 %
Eosinophils Absolute: 81 cells/uL (ref 15–500)
Eosinophils Relative: 1.1 %
HCT: 42.8 % (ref 35.0–45.0)
Hemoglobin: 14.2 g/dL (ref 11.7–15.5)
Lymphs Abs: 2198 cells/uL (ref 850–3900)
MCH: 30.4 pg (ref 27.0–33.0)
MCHC: 33.2 g/dL (ref 32.0–36.0)
MCV: 91.6 fL (ref 80.0–100.0)
MPV: 10.4 fL (ref 7.5–12.5)
Monocytes Relative: 7.6 %
Neutro Abs: 4507 cells/uL (ref 1500–7800)
Neutrophils Relative %: 60.9 %
Platelets: 156 10*3/uL (ref 140–400)
RBC: 4.67 10*6/uL (ref 3.80–5.10)
RDW: 12.5 % (ref 11.0–15.0)
Total Lymphocyte: 29.7 %
WBC: 7.4 10*3/uL (ref 3.8–10.8)

## 2019-01-02 LAB — COMPLETE METABOLIC PANEL WITH GFR
AG Ratio: 1.5 (calc) (ref 1.0–2.5)
ALT: 20 U/L (ref 6–29)
AST: 24 U/L (ref 10–35)
Albumin: 4.3 g/dL (ref 3.6–5.1)
Alkaline phosphatase (APISO): 108 U/L (ref 37–153)
BUN/Creatinine Ratio: 20 (calc) (ref 6–22)
BUN: 18 mg/dL (ref 7–25)
CO2: 29 mmol/L (ref 20–32)
Calcium: 10 mg/dL (ref 8.6–10.4)
Chloride: 100 mmol/L (ref 98–110)
Creat: 0.92 mg/dL — ABNORMAL HIGH (ref 0.60–0.88)
GFR, Est African American: 63 mL/min/{1.73_m2} (ref >=60)
GFR, Est Non African American: 54 mL/min/{1.73_m2} — ABNORMAL LOW (ref >=60)
Globulin: 2.8 g/dL (calc) (ref 1.9–3.7)
Glucose, Bld: 134 mg/dL — ABNORMAL HIGH (ref 65–99)
Potassium: 4.1 mmol/L (ref 3.5–5.3)
Sodium: 140 mmol/L (ref 135–146)
Total Bilirubin: 0.7 mg/dL (ref 0.2–1.2)
Total Protein: 7.1 g/dL (ref 6.1–8.1)

## 2019-01-02 LAB — VITAMIN D 25 HYDROXY (VIT D DEFICIENCY, FRACTURES): Vit D, 25-Hydroxy: 48 ng/mL (ref 30–100)

## 2019-01-02 LAB — HEMOGLOBIN A1C
Hgb A1c MFr Bld: 6.2 % of total Hgb — ABNORMAL HIGH (ref <5.7)
Mean Plasma Glucose: 131 (calc)
eAG (mmol/L): 7.3 (calc)

## 2019-01-02 LAB — MAGNESIUM: Magnesium: 2 mg/dL (ref 1.5–2.5)

## 2019-01-02 LAB — TSH: TSH: 3.98 mIU/L (ref 0.40–4.50)

## 2019-01-17 ENCOUNTER — Other Ambulatory Visit: Payer: Self-pay | Admitting: Internal Medicine

## 2019-01-17 ENCOUNTER — Other Ambulatory Visit: Payer: Self-pay | Admitting: Cardiology

## 2019-01-17 DIAGNOSIS — I5032 Chronic diastolic (congestive) heart failure: Secondary | ICD-10-CM

## 2019-01-17 DIAGNOSIS — I10 Essential (primary) hypertension: Secondary | ICD-10-CM

## 2019-01-29 ENCOUNTER — Ambulatory Visit (INDEPENDENT_AMBULATORY_CARE_PROVIDER_SITE_OTHER): Payer: Medicare Other

## 2019-01-29 ENCOUNTER — Other Ambulatory Visit: Payer: Self-pay

## 2019-01-29 DIAGNOSIS — I4891 Unspecified atrial fibrillation: Secondary | ICD-10-CM | POA: Diagnosis not present

## 2019-01-29 DIAGNOSIS — Z8673 Personal history of transient ischemic attack (TIA), and cerebral infarction without residual deficits: Secondary | ICD-10-CM | POA: Diagnosis not present

## 2019-01-29 DIAGNOSIS — Z0001 Encounter for general adult medical examination with abnormal findings: Secondary | ICD-10-CM

## 2019-01-29 LAB — POCT INR: INR: 3 (ref 2.0–3.0)

## 2019-01-29 NOTE — Patient Instructions (Signed)
Description   Instructed patients son and pt to continue taking to 2mg  daily except 3mg  on Mondays. Recheck INR in 6 weeks.  Call us at [facility] # [phone], Main # [phone].

## 2019-02-14 ENCOUNTER — Other Ambulatory Visit: Payer: Self-pay | Admitting: Adult Health

## 2019-02-14 ENCOUNTER — Other Ambulatory Visit: Payer: Self-pay | Admitting: Internal Medicine

## 2019-02-24 ENCOUNTER — Ambulatory Visit: Payer: Self-pay | Admitting: Adult Health

## 2019-02-26 ENCOUNTER — Ambulatory Visit: Payer: Self-pay | Admitting: Adult Health

## 2019-02-28 ENCOUNTER — Other Ambulatory Visit: Payer: Self-pay

## 2019-02-28 ENCOUNTER — Emergency Department (HOSPITAL_COMMUNITY)
Admission: EM | Admit: 2019-02-28 | Discharge: 2019-02-28 | Disposition: A | Discharge status: Home / Self Care | Payer: Medicare Other | Attending: Emergency Medicine | Admitting: Emergency Medicine

## 2019-02-28 ENCOUNTER — Emergency Department (HOSPITAL_COMMUNITY): Payer: Medicare Other

## 2019-02-28 ENCOUNTER — Encounter (HOSPITAL_COMMUNITY): Payer: Self-pay | Admitting: *Deleted

## 2019-02-28 DIAGNOSIS — F0150 Vascular dementia without behavioral disturbance: Secondary | ICD-10-CM | POA: Insufficient documentation

## 2019-02-28 DIAGNOSIS — R1084 Generalized abdominal pain: Secondary | ICD-10-CM | POA: Diagnosis not present

## 2019-02-28 DIAGNOSIS — I5030 Unspecified diastolic (congestive) heart failure: Secondary | ICD-10-CM | POA: Diagnosis not present

## 2019-02-28 DIAGNOSIS — R4182 Altered mental status, unspecified: Secondary | ICD-10-CM | POA: Diagnosis present

## 2019-02-28 DIAGNOSIS — I130 Hypertensive heart and chronic kidney disease with heart failure and stage 1 through stage 4 chronic kidney disease, or unspecified chronic kidney disease: Secondary | ICD-10-CM | POA: Diagnosis not present

## 2019-02-28 DIAGNOSIS — Z8673 Personal history of transient ischemic attack (TIA), and cerebral infarction without residual deficits: Secondary | ICD-10-CM | POA: Insufficient documentation

## 2019-02-28 DIAGNOSIS — E1122 Type 2 diabetes mellitus with diabetic chronic kidney disease: Secondary | ICD-10-CM | POA: Insufficient documentation

## 2019-02-28 DIAGNOSIS — J9811 Atelectasis: Secondary | ICD-10-CM | POA: Diagnosis not present

## 2019-02-28 DIAGNOSIS — R410 Disorientation, unspecified: Secondary | ICD-10-CM | POA: Diagnosis not present

## 2019-02-28 DIAGNOSIS — N182 Chronic kidney disease, stage 2 (mild): Secondary | ICD-10-CM | POA: Diagnosis not present

## 2019-02-28 DIAGNOSIS — Z7901 Long term (current) use of anticoagulants: Secondary | ICD-10-CM | POA: Insufficient documentation

## 2019-02-28 DIAGNOSIS — Z79899 Other long term (current) drug therapy: Secondary | ICD-10-CM | POA: Insufficient documentation

## 2019-02-28 DIAGNOSIS — I10 Essential (primary) hypertension: Secondary | ICD-10-CM | POA: Diagnosis not present

## 2019-02-28 DIAGNOSIS — R109 Unspecified abdominal pain: Secondary | ICD-10-CM | POA: Diagnosis not present

## 2019-02-28 LAB — CBC WITH DIFFERENTIAL/PLATELET
Abs Immature Granulocytes: 0.02 10*3/uL (ref 0.00–0.07)
Basophils Absolute: 0.1 10*3/uL (ref 0.0–0.1)
Basophils Relative: 0 %
Eosinophils Absolute: 0.1 10*3/uL (ref 0.0–0.5)
Eosinophils Relative: 1 %
HCT: 48 % — ABNORMAL HIGH (ref 36.0–46.0)
Hemoglobin: 15.8 g/dL — ABNORMAL HIGH (ref 12.0–15.0)
Immature Granulocytes: 0 %
Lymphocytes Relative: 22 %
Lymphs Abs: 2.4 10*3/uL (ref 0.7–4.0)
MCH: 31.2 pg (ref 26.0–34.0)
MCHC: 32.9 g/dL (ref 30.0–36.0)
MCV: 94.9 fL (ref 80.0–100.0)
Monocytes Absolute: 0.8 10*3/uL (ref 0.1–1.0)
Monocytes Relative: 7 %
Neutro Abs: 7.9 10*3/uL — ABNORMAL HIGH (ref 1.7–7.7)
Neutrophils Relative %: 70 %
Platelets: 230 10*3/uL (ref 150–400)
RBC: 5.06 MIL/uL (ref 3.87–5.11)
RDW: 13.2 % (ref 11.5–15.5)
WBC: 11.2 10*3/uL — ABNORMAL HIGH (ref 4.0–10.5)
nRBC: 0 % (ref 0.0–0.2)

## 2019-02-28 LAB — URINALYSIS, ROUTINE W REFLEX MICROSCOPIC
Bilirubin Urine: NEGATIVE
Glucose, UA: NEGATIVE mg/dL
Ketones, ur: NEGATIVE mg/dL
Leukocytes,Ua: NEGATIVE
Nitrite: NEGATIVE
Protein, ur: NEGATIVE mg/dL
Specific Gravity, Urine: 1.01 (ref 1.005–1.030)
pH: 5 (ref 5.0–8.0)

## 2019-02-28 LAB — COMPREHENSIVE METABOLIC PANEL
ALT: 23 U/L (ref 0–44)
AST: 32 U/L (ref 15–41)
Albumin: 4.2 g/dL (ref 3.5–5.0)
Alkaline Phosphatase: 109 U/L (ref 38–126)
Anion gap: 14 (ref 5–15)
BUN: 16 mg/dL (ref 8–23)
CO2: 24 mmol/L (ref 22–32)
Calcium: 10 mg/dL (ref 8.9–10.3)
Chloride: 101 mmol/L (ref 98–111)
Creatinine, Ser: 1.03 mg/dL — ABNORMAL HIGH (ref 0.44–1.00)
GFR calc Af Amer: 55 mL/min — ABNORMAL LOW (ref >60)
GFR calc non Af Amer: 47 mL/min — ABNORMAL LOW (ref >60)
Glucose, Bld: 135 mg/dL — ABNORMAL HIGH (ref 70–99)
Potassium: 4.4 mmol/L (ref 3.5–5.1)
Sodium: 139 mmol/L (ref 135–145)
Total Bilirubin: 1.1 mg/dL (ref 0.3–1.2)
Total Protein: 7.8 g/dL (ref 6.5–8.1)

## 2019-02-28 LAB — CBG MONITORING, ED: Glucose-Capillary: 110 mg/dL — ABNORMAL HIGH (ref 70–99)

## 2019-02-28 LAB — LACTIC ACID, PLASMA
Lactic Acid, Venous: 3.5 mmol/L (ref 0.5–1.9)
Lactic Acid, Venous: 1.7 mmol/L (ref 0.5–1.9)

## 2019-02-28 LAB — PROTIME-INR
INR: 2.1 — ABNORMAL HIGH (ref 0.8–1.2)
Prothrombin Time: 22.8 seconds — ABNORMAL HIGH (ref 11.4–15.2)

## 2019-02-28 LAB — APTT: aPTT: 40 seconds — ABNORMAL HIGH (ref 24–36)

## 2019-02-28 MED ORDER — SODIUM CHLORIDE 0.9 % IV BOLUS
500.0000 mL | Freq: Once | INTRAVENOUS | Status: AC
  Administered 2019-02-28: 500 mL via INTRAVENOUS

## 2019-02-28 MED ORDER — METOPROLOL TARTRATE 25 MG PO TABS
12.5000 mg | ORAL_TABLET | Freq: Once | ORAL | Status: DC
  Filled 2019-02-28: qty 1

## 2019-02-28 MED ORDER — IOHEXOL 300 MG/ML  SOLN
100.0000 mL | Freq: Once | INTRAMUSCULAR | Status: AC | PRN
  Administered 2019-02-28: 15:00:00 80 mL via INTRAVENOUS

## 2019-02-28 MED ORDER — ACETAMINOPHEN 325 MG PO TABS
650.0000 mg | ORAL_TABLET | Freq: Once | ORAL | Status: AC
  Administered 2019-02-28: 650 mg via ORAL
  Filled 2019-02-28: qty 2

## 2019-02-28 NOTE — ED Notes (Signed)
Son at bedside.

## 2019-02-28 NOTE — ED Notes (Signed)
Patient has petechiae rash on bilateral lower legs of which her son said stated 2 weeks ago.

## 2019-02-28 NOTE — ED Notes (Signed)
Lactic 3.5 reported to Dr. Stark Jock

## 2019-02-28 NOTE — ED Triage Notes (Signed)
Patient presents to ed via [facility] states she is from [place] states she was last seen normal at 730 am and staff member was with her for approx. 30 mins,  Just prior to arrival  Patient was found sitting in a chair with on leg over the arm of the chair wasn't responding as her normal. However upon arrival patient is alert oriented  To name place and year. C/o pain in her right arm and jaw no history of injury. C/o tenderness in abd when MD presses on it. Ems states staff  Said patient is normally able to carry on a conversation and has mild dementia. No history of fall. Son at bedside.

## 2019-02-28 NOTE — ED Provider Notes (Addendum)
[facility] EMERGENCY DEPARTMENT Provider Note   CSN: [id number] Arrival date & time: 02/28/19  1239     History   Chief Complaint Chief Complaint  Patient presents with   Altered Mental Status    HPI Lisa Hopkins is a 85 y.o. female with history of vascular dementia, CVA, diabetes, GERD, hiatal hernia, hypertension, hyperlipidemia, A. fib on Coumadin presents today from assisted living facility for possible altered mental status.  Patient lives in her own home at [facility].  Patient was apparently checked on around 7:30 AM and was found to be in her normal state of health.  When staff returned they found her slumped over in a chair with her leg over the edge of the chair and not responding appropriately.  On initial evaluation patient is alert and oriented x3 she is reporting some mild abdominal tenderness with no other complaints.  She does not remember why she was sideways in the chair but denies any fall or injury and reports that she is feeling well unless we press upon her abdomen.  She describes a mild aching pain of the abdomen only with palpation improved with rest and nonradiating.  Denies fever/chills, headache, neck pain, fall/injury, vision changes, chest pain/shortness of breath, nausea/vomiting, diarrhea, numbness/tingling, weakness or any additional concerns.     HPI  Past Medical History:  Diagnosis Date   Anxiety    Aphasia as late effect of cerebrovascular accident    Atrial fibrillation (HCC)    CKD (chronic kidney disease), stage II    CVA (cerebral infarction)    Diabetes mellitus type II    GERD (gastroesophageal reflux disease)    Hiatal hernia    HTN (hypertension)    Hyperlipidemia    IBS (irritable bowel syndrome)    Stroke (HCC)    Vitamin D deficiency     Patient Active Problem List   Diagnosis Date Noted   Vascular dementia without behavioral disturbance (HCC) 12/31/2018   Other abnormal glucose  (prediabetes) 09/25/2018   Protein-calorie malnutrition, severe 06/20/2018   DVT, lower extremity, distal, acute, right (HCC) 03/25/2018   Encounter for general adult medical examination with abnormal findings 12/21/2017   Hiatal hernia 12/19/2017   CKD stage 2 due to type 2 diabetes mellitus (HCC) 08/02/2017   Type 2 diabetes mellitus with stage 2 chronic kidney disease, without long-term current use of insulin (HCC) 12/25/2016   Atherosclerosis of aorta (HCC) 12/03/2015   GERD 09/25/2014   Diastolic CHF (HCC) 08/04/2014   Hyperlipidemia, mixed    Vitamin D deficiency    Anxiety state 03/15/2010   Essential hypertension 03/15/2010   Atrial fibrillation (HCC) 03/15/2010   APHASIA DUE TO CEREBROVASCULAR DISEASE 03/15/2010   History of CVA (cerebrovascular accident) 03/07/2010    Past Surgical History:  Procedure Laterality Date   CATARACT EXTRACTION, BILATERAL     CHOLECYSTECTOMY     COLONOSCOPY  2005   ESOPHAGOGASTRODUODENOSCOPY (EGD) WITH PROPOFOL N/A 11/28/2017   Procedure: ESOPHAGOGASTRODUODENOSCOPY (EGD) WITH PROPOFOL;  Surgeon: Graylin Shiver, MD;  Location: [facility] ENDOSCOPY;  Service: Endoscopy;  Laterality: N/A;   TONSILLECTOMY     UPPER GASTROINTESTINAL ENDOSCOPY  2005     OB History   No obstetric history on file.      Home Medications    Prior to Admission medications   Medication Sig Start Date End Date Taking? Authorizing Provider  Cholecalciferol (VITAMIN D3) 125 MCG (5000 UT) TABS Take 5,000-10,000 Units by mouth See admin instructions. Take two tablets (  10,000 units) by mouth on Monday, Wednesday, Friday after supper, take one tablet (5,000 units) on Sunday, Tuesday, Thursday, Saturday after supper   Yes [provider]  CINNAMON PO Take 1 capsule by mouth daily after breakfast.    Yes [provider]  furosemide (LASIX) 20 MG tablet Take 1 tablet Daily for BP & Fluid Patient taking differently: Take 20 mg by mouth daily  after breakfast. for BP & Fluid 10/09/18  Yes Lucky Cowboy, MD  loperamide (IMODIUM) 2 MG capsule TAKE 1 TABLET AT NIGHT. Patient taking differently: Take 2 mg by mouth daily after supper.  02/14/19  Yes Lucky Cowboy, MD  losartan (COZAAR) 50 MG tablet Take 1 tablet Daily for  BP Patient taking differently: Take 50 mg by mouth daily after breakfast. for  BP 10/09/18  Yes Lucky Cowboy, MD  metoprolol tartrate (LOPRESSOR) 25 MG tablet Take 1/2 tablet 2 x /day for BP Patient taking differently: Take 12.5 mg by mouth 2 (two) times daily after a meal. for BP 08/10/18  Yes Lucky Cowboy, MD  pantoprazole (PROTONIX) 40 MG tablet Take 1 tablet Daily for Heart burn & Indigestion Patient taking differently: Take 40 mg by mouth daily after breakfast. for Heart burn & Indigestion 02/14/19  Yes Lucky Cowboy, MD  potassium chloride (K-DUR) 10 MEQ tablet Take 1 tablet Daily for Potassium Replacement Patient taking differently: Take 10 mEq by mouth daily after supper. for Potassium Replacement 01/17/19  Yes Lucky Cowboy, MD  QUEtiapine (SEROQUEL) 25 MG tablet Take 1/2 tablet at Bedtime Patient taking differently: Take 12.5 mg by mouth at bedtime.  10/09/18  Yes Lucky Cowboy, MD  warfarin (COUMADIN) 2 MG tablet TAKE 1 TABLET (2 MG) DAILY EXCEPT ON MONDAY TAKE 1.5 TABLETS (3MG ). Patient taking differently: Take 2 mg by mouth See admin instructions. Take one tablet (2 mg) by mouth daily after breakfast on Sunday, Tuesday, Wednesday, Thursday, Friday, Saturday (take 3 mg tablet on Mondays) 01/17/19  Yes 01/19/19, MD  warfarin (COUMADIN) 3 MG tablet Take 3 mg by mouth every Monday. After breakfast (take 2 mg tablet on all other days of the week)   Yes [provider]  dicyclomine (BENTYL) 20 MG tablet Take 1 tablet 3 x day if needed for nausea, bloating, cramping or diarrhea Patient not taking: Reported on 02/28/2019 05/16/18   05/18/18, MD  feeding supplement, ENSURE  ENLIVE, (ENSURE ENLIVE) LIQD Take 237 mLs by mouth 2 (two) times daily between meals. Patient not taking: Reported on 02/28/2019 06/20/18   06/22/18, MD  ondansetron (ZOFRAN ODT) 4 MG disintegrating tablet Dissolve 1 tablet under tongue every 6 to 8 hours if needed for nausea & / or vomitting Patient not taking: Reported on 02/28/2019 04/10/18   04/12/18, MD    Family History Family History  Problem Relation Age of Onset   Diabetes Mother    Stroke Mother    Hypertension Brother    Heart disease Brother    Arthritis Sister        Rhematoid   Arthritis Sister        Rhematoid    Social History Social History   Tobacco Use   Smoking status: Never Smoker   Smokeless tobacco: Never Used  Substance Use Topics   Alcohol use: No   Drug use: No     Allergies   Ace inhibitors, Fosamax [alendronate sodium], Norvasc [amlodipine besylate], and Zocor [simvastatin]   Review of Systems Review of Systems Ten systems are reviewed  and are negative for acute change except as noted in the HPI   Physical Exam Updated Vital Signs BP (!) 166/73    Pulse 77    Temp (!) 96.9 F (36.1 C) (Rectal)    Resp 15    Ht 5\' 4"  (1.626 m)    Wt 54.4 kg    SpO2 99%    BMI 20.60 kg/m   Physical Exam Constitutional:      General: She is not in acute distress.    Appearance: Normal appearance. She is well-developed. She is not ill-appearing or diaphoretic.     Comments: Elderly  HENT:     Head: Normocephalic and atraumatic. No raccoon eyes or Battle's sign.     Jaw: There is normal jaw occlusion. No trismus.     Right Ear: External ear normal.     Left Ear: External ear normal.     Nose: Nose normal. No rhinorrhea.     Right Nostril: No epistaxis.     Left Nostril: No epistaxis.     Mouth/Throat:     Mouth: Mucous membranes are moist.     Pharynx: Oropharynx is clear.  Eyes:     General: Vision grossly intact. Gaze aligned appropriately.     Extraocular Movements:  Extraocular movements intact.     Conjunctiva/sclera: Conjunctivae normal.     Pupils: Pupils are equal, round, and reactive to light.     Comments: Visual fields grossly intact bilaterally  Neck:     Musculoskeletal: Full passive range of motion without pain, normal range of motion and neck supple. No spinous process tenderness.     Trachea: Trachea and phonation normal. No tracheal deviation.  Cardiovascular:     Rate and Rhythm: Normal rate. Rhythm irregularly irregular.     Pulses:          Dorsalis pedis pulses are 2+ on the right side and 2+ on the left side.     Heart sounds: Normal heart sounds.  Pulmonary:     Effort: Pulmonary effort is normal. No accessory muscle usage or respiratory distress.     Breath sounds: Normal breath sounds and air entry.  Chest:     Chest wall: No deformity, tenderness or crepitus.  Abdominal:     General: There is no distension.     Palpations: Abdomen is soft.     Tenderness: There is generalized abdominal tenderness. There is no guarding or rebound.  Musculoskeletal: Normal range of motion.     Comments: No midline C/T/L spinal tenderness to palpation, no paraspinal muscle tenderness, no deformity, crepitus, or step-off noted. No sign of injury to the neck or back. - Hips stable to compression bilaterally without pain. Patient able to actively bring bilateral knees towards chest without pain. Patient able to pull herself to a seated position unassisted. - All major joints brought through range of motion with appropriate strength for age.  Feet:     Right foot:     Protective Sensation: 3 sites tested. 3 sites sensed.     Left foot:     Protective Sensation: 3 sites tested. 3 sites sensed.  Skin:    General: Skin is warm and dry.     Capillary Refill: Capillary refill takes less than 2 seconds.  Neurological:     Mental Status: She is alert.     GCS: GCS eye subscore is 4. GCS verbal subscore is 5. GCS motor subscore is 6.     Comments:  Speech is clear  and goal oriented, follows commands Major Cranial nerves without deficit, no facial droop Normal strength in upper and lower extremities bilaterally including dorsiflexion and plantar flexion, strong and equal grip strength Sensation normal to light and sharp touch Moves extremities without ataxia, coordination intact Normal finger to nose maze and rapid alternating movements No pronator drift  Psychiatric:        Behavior: Behavior normal.      ED Treatments / Results  Labs (all labs ordered are listed, but only abnormal results are displayed) Labs Reviewed  LACTIC ACID, PLASMA - Abnormal; Notable for the following components:      Result Value   Lactic Acid, Venous 3.5 (*)    All other components within normal limits  COMPREHENSIVE METABOLIC PANEL - Abnormal; Notable for the following components:   Glucose, Bld 135 (*)    Creatinine, Ser 1.03 (*)    GFR calc non Af Amer 47 (*)    GFR calc Af Amer 55 (*)    All other components within normal limits  CBC WITH DIFFERENTIAL/PLATELET - Abnormal; Notable for the following components:   WBC 11.2 (*)    Hemoglobin 15.8 (*)    HCT 48.0 (*)    Neutro Abs 7.9 (*)    All other components within normal limits  APTT - Abnormal; Notable for the following components:   aPTT 40 (*)    All other components within normal limits  PROTIME-INR - Abnormal; Notable for the following components:   Prothrombin Time 22.8 (*)    INR 2.1 (*)    All other components within normal limits  URINALYSIS, ROUTINE W REFLEX MICROSCOPIC - Abnormal; Notable for the following components:   Hgb urine dipstick SMALL (*)    Bacteria, UA RARE (*)    All other components within normal limits  CBG MONITORING, ED - Abnormal; Notable for the following components:   Glucose-Capillary 110 (*)    All other components within normal limits  CULTURE, BLOOD (ROUTINE X 2)  CULTURE, BLOOD (ROUTINE X 2)  URINE CULTURE  LACTIC ACID, PLASMA  LACTIC ACID,  PLASMA    EKG EKG Interpretation  Date/Time:  Friday February 28 2019 12:50:50 EDT Ventricular Rate:  79 PR Interval:    QRS Duration: 94 QT Interval:  403 QTC Calculation: 462 R Axis:   94 Text Interpretation:  Atrial fibrillation Right axis deviation Borderline low voltage, extremity leads Confirmed by Geoffery LyonseLo, Douglas ([id number]) on 02/28/2019 2:23:38 PM   Radiology Ct Abdomen Pelvis W Contrast  Result Date: 02/28/2019 CLINICAL DATA:  Abdominal pain and tenderness. EXAM: CT ABDOMEN AND PELVIS WITH CONTRAST TECHNIQUE: Multidetector CT imaging of the abdomen and pelvis was performed using the standard protocol following bolus administration of intravenous contrast. CONTRAST:  80mL OMNIPAQUE IOHEXOL 300 MG/ML  SOLN COMPARISON:  06/16/2018 CT abdomen/pelvis. FINDINGS: Lower chest: Compressive atelectasis at left lung base due to the hiatal hernia. Hepatobiliary: Normal liver size. No liver mass. Cholecystectomy. Mild central intrahepatic biliary ductal dilatation has not appreciably changed. Dilated common bile duct (13 mm diameter), previously 11 mm. No radiopaque choledocholithiasis. Pancreas: Normal, with no mass or duct dilation. Spleen: Normal size. No mass. Adrenals/Urinary Tract: Normal adrenals. Symmetric normal contrast nephrograms. No hydronephrosis. Subcentimeter hypodense left renal cortical lesions are too small to characterize and are unchanged, considered benign. No new renal lesions. Normal bladder. Stomach/Bowel: Large hiatal hernia, unchanged. Otherwise normal nondistended stomach. Normal caliber small bowel with no small bowel wall thickening. Appendix not discretely visualized. No pericecal inflammatory changes. Mild  sigmoid diverticulosis, with no large bowel wall thickening or significant pericolonic fat stranding. Vascular/Lymphatic: Atherosclerotic nonaneurysmal abdominal aorta. Patent portal, splenic, hepatic and renal veins. No pathologically enlarged lymph nodes in the abdomen or  pelvis. Reproductive: Stable coarsely calcified small uterine fibroids. No adnexal masses. Other: No pneumoperitoneum, ascites or focal fluid collection. Musculoskeletal: No aggressive appearing focal osseous lesions. Mild thoracolumbar spondylosis. IMPRESSION: 1. No acute abnormality. No evidence of bowel obstruction or acute bowel inflammation. Mild sigmoid diverticulosis, with no evidence of acute diverticulitis. 2. Chronic common bile duct dilation (13 mm diameter), slightly increased from 03/16/2018 CT. No radiopaque choledocholithiasis. Findings are probably due to chronic post cholecystectomy effect. Recommend correlation with liver function tests. 3. Large hiatal hernia. 4.  Aortic Atherosclerosis (ICD10-I70.0). Electronically Signed   By: Delbert Phenix M.D.   On: 02/28/2019 15:58   Dg Chest Port 1 View  Result Date: 02/28/2019 CLINICAL DATA:  Right arm pain, abdominal pain, altered mental status EXAM: PORTABLE CHEST 1 VIEW COMPARISON:  03/21/2018 chest x-ray 03/22/2018 CT angio chest FINDINGS: Bilateral mild interstitial thickening. No pleural effusions. Mild left basilar airspace disease likely reflecting atelectasis. No pneumothorax. Stable cardiomediastinal silhouette. No aggressive osseous lesion. Large hiatal hernia. IMPRESSION: 1. No acute cardiopulmonary disease. 2. Large hiatal hernia.  Left basilar atelectasis. Electronically Signed   By: Elige Ko   On: 02/28/2019 13:15    Procedures Procedures (including critical care time)  Medications Ordered in ED Medications  sodium chloride 0.9 % bolus 500 mL (0 mLs Intravenous Stopped 02/28/19 1458)  iohexol (OMNIPAQUE) 300 MG/ML solution 100 mL (80 mLs Intravenous Contrast Given 02/28/19 1521)     Initial Impression / Assessment and Plan / ED Course  I have reviewed the triage vital signs and the nursing notes.  Pertinent labs & imaging results that were available during my care of the patient were reviewed by me and considered in my  medical decision making (see chart for details).    1:00 PM: Patient is alert and oriented to self, place, time and event.  No focal neuro deficits, cranial nerves intact and moves bilateral extremities with equal strength.  No sign of injury, lungs clear, heart regular rate and rhythm, abdomen soft but with tenderness particularly to the lower abdomen.  Neurovascular intact x4 extremities.  Patient endorsing abdominal pain and has no other complaints.  Patient seen and evaluated by Dr. Judd Lien.  Plan of care is to obtain blood work and CT abdomen pelvis. - Patient son is now at bedside reports that his mother appears baseline and is alert and oriented per her normal self with no abnormalities per son. - Lactic 3.5 PT/INR elevated, patient on Coumadin APTT elevated CBG 110 CBC with leukocytosis of 11.2 with left shift, hemoglobin 15.8, may be secondary to infection versus dehydration.   CMP nonacute Urinalysis with rare bacteria, small hemoglobin otherwise within normal limits Blood cultures and urine culture pending Chest x-ray:  IMPRESSION:  1. No acute cardiopulmonary disease.  2. Large hiatal hernia. Left basilar atelectasis.   EKG:  Atrial fibrillation Right axis deviation Borderline low voltage, extremity leads Confirmed by Geoffery Lyons ([id number]) on 02/28/2019 2:23:38 PM - CT abdomen pelvis pending.  Rediscussed case with Dr. Judd Lien, as patient without focal neuro deficits and is baseline per son without history of fall or injury no indication for CT imaging of the head at this time.  Plan of care is to follow-up on CT abdomen pelvis and reassess pending no acute findings anticipate discharge. - CT AP:  IMPRESSION:  1. No acute abnormality. No evidence of bowel obstruction or acute  bowel inflammation. Mild sigmoid diverticulosis, with no evidence of  acute diverticulitis.  2. Chronic common bile duct dilation (13 mm diameter), slightly  increased from 03/16/2018 CT. No radiopaque  choledocholithiasis.  Findings are probably due to chronic post cholecystectomy effect.  Recommend correlation with liver function tests.  3. Large hiatal hernia.  4. Aortic Atherosclerosis (ICD10-I70.0).  - Patient reassessed resting comfortably and in no acute distress.  She denies any pain and reports she is feeling well at this time.  Vital signs stable.  I have updated patient and her son about plan of care for IV fluids, recheck of labs and reassessment and anticipating discharge, they are both agreeable to this. - Care handoff given to [facility], PA-C at shift change, plan of care at this time is to await completion of fluid bolus, recheck of lactic and reassess. Orthostatics ordered.   Note: Portions of this report may have been transcribed using voice recognition software. Every effort was made to ensure accuracy; however, inadvertent computerized transcription errors may still be present. Final Clinical Impressions(s) / ED Diagnoses   Final diagnoses:  None    ED Discharge Orders    None       Bill Salinas, PA-C 02/28/19 1616    Elizabeth Palau 02/28/19 1913    Geoffery Lyons, MD 03/02/19 2322

## 2019-02-28 NOTE — ED Provider Notes (Signed)
  Physical Exam  BP (!) 166/73   Pulse 77   Temp (!) 96.9 F (36.1 C) (Rectal)   Resp 15   Ht 5\' 4"  (1.626 m)   Wt 54.4 kg   SpO2 99%   BMI 20.60 kg/m   Physical Exam  Gen: elderly, chronically ill appearing Neuro: alert to person and place. Baseline per son  ED Course/Procedures     Procedures  MDM   Patient signed out to me by B. Athena Masse, PA-C.  Please see previous notes for further history.  In brief, patient presenting for evaluation of generalized confusion which lasted for 30 minutes before resolving.  Patient has been at baseline since being in the ER.  Son in the room with patient, who confirms patient's mental status.  Work-up so far has been reassuring, no obvious signs of infection.  No obvious sepsis.  No obvious acute neurologic changes.  Patient's lactic was found to be elevated however, plan is for fluids and redraw lactic.  If trending down and able to tolerate PO, patient can be discharged. Pt and son in agreement with plan.   Repeat lactic 1.7.  Patient tolerating p.o.  Ambulatory without difficulty.  Discussed plan with patient and son.  Son states he has somebody coming out to help patient at 10:00 tonight as well as tomorrow morning.  I encouraged patient and son to discuss plans for future with primary care doctor, as I foresee patient will likely need further care in the near future (is living in independent living area).  Of note, patient's blood pressure slightly elevated.  This is likely due to the fact that it is time for her second dose of metoprolol.  However all of patient's medications are together in a packet, unknown which one is metoprolol.  In order not to double dose, metoprolol was held in the ED.  Encourage follow-up with primary care for recheck of symptoms.  At this time, patient present for discharge.  Return precautions given.  Patient and son state they understand and agree to plan.        Franchot Heidelberg, PA-C 02/28/19 1844    Malvin Johns, MD 02/28/19 [phone]

## 2019-02-28 NOTE — Discharge Instructions (Addendum)
Continue taking home medications. Make sure you are staying well-hydrated water.  This is very important. Follow-up with your primary care doctor next week for reassessment of your symptoms and for discussion about living situation and whether or not you need more help. Return to the emergency room with any new, worsening, concerning symptoms.

## 2019-03-01 LAB — URINE CULTURE: Culture: NO GROWTH

## 2019-03-04 ENCOUNTER — Ambulatory Visit: Payer: Medicare Other | Admitting: Adult Health Nurse Practitioner

## 2019-03-04 ENCOUNTER — Other Ambulatory Visit: Payer: Self-pay

## 2019-03-04 ENCOUNTER — Encounter: Payer: Self-pay | Admitting: Adult Health Nurse Practitioner

## 2019-03-04 VITALS — BP 124/68 | Temp 97.3°F | Wt 112.4 lb

## 2019-03-04 DIAGNOSIS — R8290 Unspecified abnormal findings in urine: Secondary | ICD-10-CM

## 2019-03-04 DIAGNOSIS — Z23 Encounter for immunization: Secondary | ICD-10-CM

## 2019-03-04 DIAGNOSIS — I4891 Unspecified atrial fibrillation: Secondary | ICD-10-CM | POA: Diagnosis not present

## 2019-03-04 DIAGNOSIS — R350 Frequency of micturition: Secondary | ICD-10-CM | POA: Diagnosis not present

## 2019-03-04 DIAGNOSIS — D72829 Elevated white blood cell count, unspecified: Secondary | ICD-10-CM | POA: Diagnosis not present

## 2019-03-04 DIAGNOSIS — R233 Spontaneous ecchymoses: Secondary | ICD-10-CM | POA: Diagnosis not present

## 2019-03-04 DIAGNOSIS — D6859 Other primary thrombophilia: Secondary | ICD-10-CM

## 2019-03-04 NOTE — Progress Notes (Signed)
Assessment and Plan:  Lisa Hopkins was seen today for acute visit.  Diagnoses and all orders for this visit:  Petechial rash, bilateral lower extremities Patient taking Coumadin Discussed not using compression stocking for couple weeks. Patient does not have lower extremity edema Continue to monitor Discussed when to call or return.  Needs flu shot -     Flu vaccine HIGH DOSE PF  Urinary frequency -     Urinalysis w microscopic + reflex cultur  Leukocytosis, unspecified type -     CBC with Differential/Platelet -     BASIC METABOLIC PANEL WITH GFR  Other orders -     Urine Culture -     REFLEXIVE URINE CULTURE  Atrial fibrillation (HCC) Thrombophilia (HCC) Follows with coumadin clinic, appointment in 8 days Disscussed S&S of bleeding Continue to monitor Discussed safety around the home  Further disposition pending results of labs. Discussed med's effects and SE's.   Over 30 minutes of exam, counseling, chart review, and critical decision making was performed.   Future Appointments  Date Time Provider Department Center  03/12/2019  2:15 PM [facility]  04/10/2019  2:00 PM Judd Gaudierorbett, Ashley, NP [facility] None  07/15/2019 11:00 AM Lucky CowboyMcKeown, William, MD [facility] None    ------------------------------------------------------------------------------------------------------------------   HPI 85 y.o.female presents for evaluation of rash.  She has a rash to bilateral lower extremities.  Her son reports this has been ongoing for the past two weeks.  She denies any pain, itching, drainage, numbness or tingling.  She has not put anything on this and was afraid to apply her Aveeno lotion to this area.  She does wear compression stockings daily.  Denies any fever, joint pains, headaches.  She had an ED visit on 02/28/19 for confusion that last approximately 30min.  She has history of atrial fib and on coumadin.  CT abdomen, large hiatal hernia  and aortic atherosclerosis.  Determination was dehydration that caused the dizziness and headaches.     She lives at [facility].  She is accompanied by her son day.  She is alert and oriented, very hard of hearing.  She ambulates with a rolling walker.  She has a Chief Strategy Officernurse assistant visit every morning for help with her ADLs.  Her son reports he calls her at least twice a day.  He remind her to drink water and encourages her to keep a glass of water next to the chair where she sits in her recliner.  She is active and participates in group chair exercises at [facility].  She also walks around her apartment building 0.8625miles twice a day, either inside or outside with another resident.  She follows with the Coumadin clinic for management and her next appointment is in 8 days.    Past Medical History:  Diagnosis Date  . Anxiety   . Aphasia as late effect of cerebrovascular accident   . Atrial fibrillation (HCC)   . CKD (chronic kidney disease), stage II   . CVA (cerebral infarction)   . Diabetes mellitus type II   . GERD (gastroesophageal reflux disease)   . Hiatal hernia   . HTN (hypertension)   . Hyperlipidemia   . IBS (irritable bowel syndrome)   . Stroke (HCC)   . Vitamin D deficiency      Allergies  Allergen Reactions  . Ace Inhibitors Other (See Comments)    Cough  . Fosamax [Alendronate Sodium] Other (See Comments)    Heart burn  . Norvasc [Amlodipine Besylate] Other (See  Comments)    edema  . Zocor [Simvastatin] Other (See Comments)    Elevated CPK    Current Outpatient Medications on File Prior to Visit  Medication Sig  . Cholecalciferol (VITAMIN D3) 125 MCG (5000 UT) TABS Take 5,000-10,000 Units by mouth See admin instructions. Take two tablets (10,000 units) by mouth on Monday, Wednesday, Friday after supper, take one tablet (5,000 units) on Sunday, Tuesday, Thursday, Saturday after supper  . CINNAMON PO Take 1 capsule by mouth daily after breakfast.    . dicyclomine (BENTYL) 20 MG tablet Take 1 tablet 3 x day if needed for nausea, bloating, cramping or diarrhea  . feeding supplement, ENSURE ENLIVE, (ENSURE ENLIVE) LIQD Take 237 mLs by mouth 2 (two) times daily between meals.  . furosemide (LASIX) 20 MG tablet Take 1 tablet Daily for BP & Fluid (Patient taking differently: Take 20 mg by mouth daily after breakfast. for BP & Fluid)  . loperamide (IMODIUM) 2 MG capsule TAKE 1 TABLET AT NIGHT. (Patient taking differently: Take 2 mg by mouth daily after supper. )  . losartan (COZAAR) 50 MG tablet Take 1 tablet Daily for  BP (Patient taking differently: Take 50 mg by mouth daily after breakfast. for  BP)  . metoprolol tartrate (LOPRESSOR) 25 MG tablet Take 1/2 tablet 2 x /day for BP (Patient taking differently: Take 12.5 mg by mouth 2 (two) times daily after a meal. for BP)  . ondansetron (ZOFRAN ODT) 4 MG disintegrating tablet Dissolve 1 tablet under tongue every 6 to 8 hours if needed for nausea & / or vomitting  . pantoprazole (PROTONIX) 40 MG tablet Take 1 tablet Daily for Heart burn & Indigestion (Patient taking differently: Take 40 mg by mouth daily after breakfast. for Heart burn & Indigestion)  . potassium chloride (K-DUR) 10 MEQ tablet Take 1 tablet Daily for Potassium Replacement (Patient taking differently: Take 10 mEq by mouth daily after supper. for Potassium Replacement)  . QUEtiapine (SEROQUEL) 25 MG tablet Take 1/2 tablet at Bedtime (Patient taking differently: Take 12.5 mg by mouth at bedtime. )  . warfarin (COUMADIN) 2 MG tablet TAKE 1 TABLET (2 MG) DAILY EXCEPT ON MONDAY TAKE 1.5 TABLETS (3MG ). (Patient taking differently: Take 2 mg by mouth See admin instructions. Take one tablet (2 mg) by mouth daily after breakfast on Sunday, Tuesday, Wednesday, Thursday, Friday, Saturday (take 3 mg tablet on Mondays))  . warfarin (COUMADIN) 3 MG tablet Take 3 mg by mouth every Monday. After breakfast (take 2 mg tablet on all other days of the week)    No current facility-administered medications on file prior to visit.     ROS: all negative except above.   Physical Exam:  BP 124/68   Temp (!) 97.3 F (36.3 C)   Wt 112 lb 6.4 oz (51 kg)   BMI 19.29 kg/m   General Appearance: Well nourished, in no apparent distress. Eyes: PERRLA, EOMs, conjunctiva no swelling or erythema Sinuses: No Frontal/maxillary tenderness ENT/Mouth: Ext aud canals clear, TMs without erythema, bulging. No erythema, swelling, or exudate on post pharynx.  Tonsils not swollen or erythematous. Impaired hearing. Neck: Supple, thyroid normal.  Respiratory: Respiratory effort normal, BS equal bilaterally without rales, rhonchi, wheezing or stridor.  Cardio: Irregular rate with no MRGs. Brisk peripheral pulses without edema.  Abdomen: Soft, + BS.  Non tender, no guarding, rebound, hernias, masses. Lymphatics: Non tender without lymphadenopathy.  Musculoskeletal: Full ROM, 5/5 strength, normal gait.  Skin: Warm, dry without lesions, ecchymosis. Petechial rash to bilateral  shins, non-blanchable. Neuro: Cranial nerves intact. Normal muscle tone, no cerebellar symptoms. Sensation intact.  Psych: Awake and oriented X 3, normal affect, Insight and Judgment appropriate.     Garnet Sierras, NP 12:26 PM [facility]

## 2019-03-04 NOTE — Patient Instructions (Signed)
Petechia - tiny (2 mm) dark red or purple spots on the skin. They are flat on the skin, not raised. They often show up very suddenly. They are often caused by a viral or bacterial infection. They may also be caused by a reaction to a medicine or a collagen disorder. Petechiae usually occur on the arms, legs, stomach, and buttocks. They don't itch. Monitor the areas for increase in size, itching, swelling or pain.   These are caused from broken capillaries under the skin related to the coumadin she takes.  This makes her blood thinner.  She can stop wearing the compression hose for a week to see if this helps.  Should she have any leg swelling continue to wear these.  You should contact your doctor right away or seek immediate medical care if: you also have a fever  you have other worsening symptoms  you notice the spots are spreading or getting bigger  your heart rate increases  your pulse changes  you have trouble breathing  you feel sleepy or have little energy  you have other bruising    You can add sublingual B12. The sublingual matters more than the dose, get any dose but make sure it melts in your mouth.   Will help with energy, memory/concentration, decrease nerve pain, and help with weight loss. B12 is water soluble vitamin so you can not over dose on it.  Take this once a day.  We will contact the number on file for your lab results.

## 2019-03-05 LAB — CULTURE, BLOOD (ROUTINE X 2)
Culture: NO GROWTH
Culture: NO GROWTH
Special Requests: ADEQUATE
Special Requests: ADEQUATE

## 2019-03-07 ENCOUNTER — Other Ambulatory Visit: Payer: Self-pay | Admitting: Adult Health Nurse Practitioner

## 2019-03-07 DIAGNOSIS — N3000 Acute cystitis without hematuria: Secondary | ICD-10-CM

## 2019-03-07 LAB — CBC WITH DIFFERENTIAL/PLATELET
Absolute Monocytes: 662 cells/uL (ref 200–950)
Basophils Absolute: 52 cells/uL (ref 0–200)
Basophils Relative: 0.6 %
Eosinophils Absolute: 95 cells/uL (ref 15–500)
Eosinophils Relative: 1.1 %
HCT: 40.8 % (ref 35.0–45.0)
Hemoglobin: 13.6 g/dL (ref 11.7–15.5)
Lymphs Abs: 2675 cells/uL (ref 850–3900)
MCH: 30.4 pg (ref 27.0–33.0)
MCHC: 33.3 g/dL (ref 32.0–36.0)
MCV: 91.3 fL (ref 80.0–100.0)
MPV: 10.6 fL (ref 7.5–12.5)
Monocytes Relative: 7.7 %
Neutro Abs: 5117 cells/uL (ref 1500–7800)
Neutrophils Relative %: 59.5 %
Platelets: 220 10*3/uL (ref 140–400)
RBC: 4.47 10*6/uL (ref 3.80–5.10)
RDW: 12.4 % (ref 11.0–15.0)
Total Lymphocyte: 31.1 %
WBC: 8.6 10*3/uL (ref 3.8–10.8)

## 2019-03-07 LAB — BASIC METABOLIC PANEL WITH GFR
BUN: 16 mg/dL (ref 7–25)
CO2: 31 mmol/L (ref 20–32)
Calcium: 9.7 mg/dL (ref 8.6–10.4)
Chloride: 100 mmol/L (ref 98–110)
Creat: 0.86 mg/dL (ref 0.60–0.88)
GFR, Est African American: 68 mL/min/{1.73_m2} (ref >=60)
GFR, Est Non African American: 59 mL/min/{1.73_m2} — ABNORMAL LOW (ref >=60)
Glucose, Bld: 123 mg/dL — ABNORMAL HIGH (ref 65–99)
Potassium: 3.7 mmol/L (ref 3.5–5.3)
Sodium: 139 mmol/L (ref 135–146)

## 2019-03-07 LAB — URINE CULTURE
MICRO NUMBER:: 961700
SPECIMEN QUALITY:: ADEQUATE

## 2019-03-07 LAB — URINALYSIS W MICROSCOPIC + REFLEX CULTURE
Bilirubin Urine: NEGATIVE
Glucose, UA: NEGATIVE
Hyaline Cast: NONE SEEN /LPF
Ketones, ur: NEGATIVE
Nitrites, Initial: NEGATIVE
Protein, ur: NEGATIVE
RBC / HPF: NONE SEEN /HPF (ref 0–2)
Specific Gravity, Urine: 1.012 (ref 1.001–1.03)
pH: <=5 (ref 5.0–8.0)

## 2019-03-07 LAB — CULTURE INDICATED

## 2019-03-07 MED ORDER — CEPHALEXIN 500 MG PO CAPS: ORAL_CAPSULE | ORAL | 0 refills | Status: DC

## 2019-03-07 NOTE — Progress Notes (Signed)
UTI, e-coli from culture.  Rx Cephalexin 500mg  Q12 x7 days.  Patient on coumadin, sensitive to cephalexin on reflex.

## 2019-03-13 ENCOUNTER — Other Ambulatory Visit: Payer: Self-pay

## 2019-03-13 ENCOUNTER — Ambulatory Visit (INDEPENDENT_AMBULATORY_CARE_PROVIDER_SITE_OTHER): Payer: Medicare Other

## 2019-03-13 DIAGNOSIS — Z0001 Encounter for general adult medical examination with abnormal findings: Secondary | ICD-10-CM

## 2019-03-13 DIAGNOSIS — Z8673 Personal history of transient ischemic attack (TIA), and cerebral infarction without residual deficits: Secondary | ICD-10-CM | POA: Diagnosis not present

## 2019-03-13 DIAGNOSIS — I4891 Unspecified atrial fibrillation: Secondary | ICD-10-CM | POA: Diagnosis not present

## 2019-03-13 LAB — POCT INR: INR: 3.5 — AB (ref 2.0–3.0)

## 2019-03-13 NOTE — Patient Instructions (Signed)
Description   Instructed patients son and pt to skip today's dosage of Coumadin (2mg  purple tablet) then resume same dosage 2mg  daily except 3mg  on Mondays. Recheck INR in 4 weeks.  Call us at [facility] # [phone], Main # [phone].

## 2019-03-14 ENCOUNTER — Other Ambulatory Visit: Payer: Self-pay | Admitting: Cardiology

## 2019-03-14 ENCOUNTER — Other Ambulatory Visit: Payer: Self-pay | Admitting: Internal Medicine

## 2019-03-14 DIAGNOSIS — I10 Essential (primary) hypertension: Secondary | ICD-10-CM

## 2019-03-14 NOTE — Telephone Encounter (Addendum)
Discontinued Coumadin 3 mg, so pt is only using 1 tablet strength.  Informed [facility] pharmacy that pt is suppose to take 2 mg daily except for 3 mg on Monday. Pt needs an appointment with a cardiologist for future refills. Called pt and spoke to pt's son and made him aware and also made him aware that he will need to call [facility] to schedule an appointment for his Mom. Prescription refill was sent for Coumadin 2 mg.

## 2019-03-20 ENCOUNTER — Ambulatory Visit (INDEPENDENT_AMBULATORY_CARE_PROVIDER_SITE_OTHER): Payer: Medicare Other | Admitting: *Deleted

## 2019-03-20 ENCOUNTER — Other Ambulatory Visit: Payer: Self-pay

## 2019-03-20 VITALS — BP 120/70 | HR 64 | Temp 97.0°F | Resp 16 | Wt 113.6 lb

## 2019-03-20 DIAGNOSIS — N3000 Acute cystitis without hematuria: Secondary | ICD-10-CM

## 2019-03-20 NOTE — Progress Notes (Signed)
Patient is here for a NV to recheck a urinalysis and culture. Per the patient and her son, she completed Keflex 2 capsules twice a day x 7 days. She states she still has some burning when she urinates. She also complained of vaginal burning and she has been applying A and D ointment. Per Hayden Pedro, NP, the patient was advised to try OTC Vagisil or Monostat for the burning.

## 2019-03-22 LAB — URINE CULTURE
MICRO NUMBER:: 1019804
SPECIMEN QUALITY:: ADEQUATE

## 2019-03-22 LAB — URINALYSIS, ROUTINE W REFLEX MICROSCOPIC
Bilirubin Urine: NEGATIVE
Glucose, UA: NEGATIVE
Hgb urine dipstick: NEGATIVE
Hyaline Cast: NONE SEEN /LPF
Ketones, ur: NEGATIVE
Nitrite: NEGATIVE
Protein, ur: NEGATIVE
RBC / HPF: NONE SEEN /HPF (ref 0–2)
Specific Gravity, Urine: 1.005 (ref 1.001–1.03)
Squamous Epithelial / HPF: NONE SEEN /HPF (ref <=5)
pH: <=5 (ref 5.0–8.0)

## 2019-03-24 ENCOUNTER — Telehealth: Payer: Self-pay | Admitting: Pharmacist

## 2019-03-24 ENCOUNTER — Other Ambulatory Visit: Payer: Self-pay | Admitting: Adult Health Nurse Practitioner

## 2019-03-24 DIAGNOSIS — N3000 Acute cystitis without hematuria: Secondary | ICD-10-CM

## 2019-03-24 DIAGNOSIS — B373 Candidiasis of vulva and vagina: Secondary | ICD-10-CM

## 2019-03-24 DIAGNOSIS — B3731 Acute candidiasis of vulva and vagina: Secondary | ICD-10-CM

## 2019-03-24 MED ORDER — FLUCONAZOLE 150 MG PO TABS: ORAL_TABLET | ORAL | 0 refills | Status: DC

## 2019-03-24 MED ORDER — CIPROFLOXACIN HCL 500 MG PO TABS: 500.0000 mg | ORAL_TABLET | Freq: Two times a day (BID) | ORAL | 0 refills | Status: AC

## 2019-03-24 NOTE — Telephone Encounter (Signed)
Called pt's son, moved up INR check to this Friday since pt is already seeing Dr Meda Coffee that AM.

## 2019-03-24 NOTE — Telephone Encounter (Signed)
-----   Message -----  From: Garnet Sierras, NP  Sent: 03/24/2019  9:02 AM EDT  To: Dorothy Spark, MD  Subject: Coumadin dosing                  Good morning Dr Meda Coffee. Our mutual patient Lisa Hopkins will be treated for complicated UTI and vaginal candidiasis with Cipro x7days and diflucan. Read at last visit INR 3.5 on 03/13/19 and follow up in four weeks. Wanted to pass this along should she need a change in her Coumadin management.    Sincerely,      Garnet Sierras, NP   [facility]  [address] [place]  [place], [place] [address]  Ph [phone]

## 2019-03-27 ENCOUNTER — Telehealth: Payer: Self-pay | Admitting: *Deleted

## 2019-03-27 NOTE — Telephone Encounter (Signed)
Virtual Visit Pre-Appointment Phone Call  "(Name), I am calling you today to discuss your upcoming appointment. We are currently trying to limit exposure to the virus that causes COVID-19 by seeing patients at home rather than in the office."  1. "What is the BEST phone number to call the day of the visit?" - include this in appointment notes-YES UPDATED  2. Do you have or have access to (through a family member/friend) a smartphone with video capability that we can use for your visit?"  a. If no - list the appointment type as a PHONE visit in appointment notes-YES UPDATED AND WILL CALL THE PTS SON CELL NUMBER (ON DPR TO SPEAK FOR PT).  3. Confirm consent - "In the setting of the current Covid19 crisis, you are scheduled for a (phone ) visit with DR. NELSON ON 10/30 AT 0900.  Just as we do with many in-office visits, in order for you to participate in this visit, we must obtain consent.  If you'd like, I can send this to your mychart (if signed up) or email for you to review.  Otherwise, I can obtain your verbal consent now.  All virtual visits are billed to your insurance company just like a normal visit would be.  By agreeing to a virtual visit, we'd like you to understand that the technology does not allow for your provider to perform an examination, and thus may limit your provider's ability to fully assess your condition. If your provider identifies any concerns that need to be evaluated in person, we will make arrangements to do so.  Finally, though the technology is pretty good, we cannot assure that it will always work on either your or our end, and in the setting of a video visit, we may have to convert it to a phone-only visit.  In either situation, we cannot ensure that we have a secure connection.  Are you willing to proceed?" STAFF: Did the patient verbally acknowledge consent to telehealth visit? Document YES/NO here: PTS SON GAVE VERBAL CONSENT FOR DR Delton See TO TREAT PT VIA TELEPHONE  VISIT THROUGH HIS CELL PHONE ON 10/30 AT 0900.  4. Advise patient to be prepared - "Two hours prior to your appointment, go ahead and check your blood pressure, pulse, oxygen saturation, and your weight (if you have the equipment to check those) and write them all down. When your visit starts, your provider will ask you for this information. If you have an Apple Watch or Kardia device, please plan to have heart rate information ready on the day of your appointment. Please have a pen and paper handy nearby the day of the visit as well."  SON SAID THAT COUMADIN CLINIC WILL TAKE BP/HR/Wt AFTER THE PTS TELEPHONE VISIT WITH DR Delton See TOMORROW 10/30 AT 0945 HERE IN OUR OFFICE.    5. Inform patient they will receive a phone call 15 minutes prior to their appointment time (may be from unknown caller ID) so they should be prepared to answer-YES SON IS AWARE AND WILL SPEAK FOR PT    TELEPHONE CALL NOTE  NAMIKO PRITTS has been deemed a candidate for a follow-up tele-health visit to limit community exposure during the Covid-19 pandemic. I spoke with the patient via phone to ensure availability of phone/video source, confirm preferred email & phone number, and discuss instructions and expectations.  I reminded TONYE TANCREDI to be prepared with any vital sign and/or heart rhythm information that could potentially be obtained via home monitoring,  at the time of her visit. I reminded ADDYSYN FERN to expect a phone call prior to her visit.  Nuala Alpha, LPN 57/90/3833 3:83 AM       FULL LENGTH CONSENT FOR TELE-HEALTH VISIT   I hereby voluntarily request, consent and authorize [facility] and its employed or contracted physicians, physician assistants, nurse practitioners or other licensed health care professionals (the Practitioner), to provide me with telemedicine health care services (the Services") as deemed necessary by the treating Practitioner. I acknowledge and consent to receive  the Services by the Practitioner via telemedicine. I understand that the telemedicine visit will involve communicating with the Practitioner through live audiovisual communication technology and the disclosure of certain medical information by electronic transmission. I acknowledge that I have been given the opportunity to request an in-person assessment or other available alternative prior to the telemedicine visit and am voluntarily participating in the telemedicine visit.  I understand that I have the right to withhold or withdraw my consent to the use of telemedicine in the course of my care at any time, without affecting my right to future care or treatment, and that the Practitioner or I may terminate the telemedicine visit at any time. I understand that I have the right to inspect all information obtained and/or recorded in the course of the telemedicine visit and may receive copies of available information for a reasonable fee.  I understand that some of the potential risks of receiving the Services via telemedicine include:   Delay or interruption in medical evaluation due to technological equipment failure or disruption;  Information transmitted may not be sufficient (e.g. poor resolution of images) to allow for appropriate medical decision making by the Practitioner; and/or   In rare instances, security protocols could fail, causing a breach of personal health information.  Furthermore, I acknowledge that it is my responsibility to provide information about my medical history, conditions and care that is complete and accurate to the best of my ability. I acknowledge that Practitioner's advice, recommendations, and/or decision may be based on factors not within their control, such as incomplete or inaccurate data provided by me or distortions of diagnostic images or specimens that may result from electronic transmissions. I understand that the practice of medicine is not an exact science and that  Practitioner makes no warranties or guarantees regarding treatment outcomes. I acknowledge that I will receive a copy of this consent concurrently upon execution via email to the email address I last provided but may also request a printed copy by calling the office of [place].    I understand that my insurance will be billed for this visit.   I have read or had this consent read to me.  I understand the contents of this consent, which adequately explains the benefits and risks of the Services being provided via telemedicine.   I have been provided ample opportunity to ask questions regarding this consent and the Services and have had my questions answered to my satisfaction.  I give my informed consent for the services to be provided through the use of telemedicine in my medical care  By participating in this telemedicine visit I agree to the above.  PTS SON (ON DPR AND SPEAKS FOR THE PT), GAVE VERBAL CONSENT FOR DR NELSON TO TREAT THE PT TOMORROW 10/30 AT 0900 VIA TELEPHONE VISIT THROUGH HIS CELL NUMBER.

## 2019-03-28 ENCOUNTER — Ambulatory Visit (INDEPENDENT_AMBULATORY_CARE_PROVIDER_SITE_OTHER): Payer: Medicare Other | Admitting: Pharmacist

## 2019-03-28 ENCOUNTER — Encounter: Payer: Self-pay | Admitting: Cardiology

## 2019-03-28 ENCOUNTER — Other Ambulatory Visit: Payer: Self-pay

## 2019-03-28 ENCOUNTER — Telehealth (INDEPENDENT_AMBULATORY_CARE_PROVIDER_SITE_OTHER): Payer: Medicare Other | Admitting: Cardiology

## 2019-03-28 DIAGNOSIS — I10 Essential (primary) hypertension: Secondary | ICD-10-CM

## 2019-03-28 DIAGNOSIS — Z8673 Personal history of transient ischemic attack (TIA), and cerebral infarction without residual deficits: Secondary | ICD-10-CM

## 2019-03-28 DIAGNOSIS — I5030 Unspecified diastolic (congestive) heart failure: Secondary | ICD-10-CM

## 2019-03-28 DIAGNOSIS — I4891 Unspecified atrial fibrillation: Secondary | ICD-10-CM

## 2019-03-28 DIAGNOSIS — Z0001 Encounter for general adult medical examination with abnormal findings: Secondary | ICD-10-CM

## 2019-03-28 DIAGNOSIS — I4821 Permanent atrial fibrillation: Secondary | ICD-10-CM | POA: Diagnosis not present

## 2019-03-28 LAB — POCT INR: INR: 3.9 — AB (ref 2.0–3.0)

## 2019-03-28 NOTE — Patient Instructions (Addendum)
Instructed patients son and pt to skip Saturday and Sunday's dosage of Coumadin (2mg  purple tablet) and then resume same dosage 2mg  daily except 3mg  on Mondays. Recheck INR in 1 weeks.  Call us at [facility] # [phone], Main # [phone].

## 2019-03-28 NOTE — Progress Notes (Signed)
Virtual Visit via Telephone Note   This visit type was conducted due to national recommendations for restrictions regarding the COVID-19 Pandemic (e.g. social distancing) in an effort to limit this patient's exposure and mitigate transmission in our community.  Due to her co-morbid illnesses, this patient is at least at moderate risk for complications without adequate follow up.  This format is felt to be most appropriate for this patient at this time.  The patient did not have access to video technology/had technical difficulties with video requiring transitioning to audio format only (telephone).  All issues noted in this document were discussed and addressed.  No physical exam could be performed with this format.  Please refer to the patient's chart for her  consent to telehealth for [facility].   Date:  03/28/2019   ID:  Lisa Hopkins, DOB 07/28/1924, MRN [id number]  Patient Location: [place] Provider Location: Home  PCP:  Unk Pinto, MD  Cardiologist:  Ena Dawley, MD  Electrophysiologist:  None   Evaluation Performed:  Follow-Up Visit  Chief Complaint: 1 year follow-up  History of Present Illness:    Lisa Hopkins is a 85 y.o. female with prior medical history of hypertension, chronic persistent atrial fibrillation who we have been following in our clinic for years, she was last seen in the hospital in July 2019 when she was hospitalized with coffee-ground emesis and required preop evaluation.  She has been doing great, currently lives in [place], she still able to walk with a walker and denies any chest pain or shortness of breath, she has no lower extremity edema no palpitation no dizziness no recent falls.   The patient does not have symptoms concerning for COVID-19 infection (fever, chills, cough, or new shortness of breath).    Past Medical History:  Diagnosis Date  . Anxiety   . Aphasia as late effect of  cerebrovascular accident   . Atrial fibrillation ([place])   . CKD (chronic kidney disease), stage II   . CVA (cerebral infarction)   . Diabetes mellitus type II   . GERD (gastroesophageal reflux disease)   . Hiatal hernia   . HTN (hypertension)   . Hyperlipidemia   . IBS (irritable bowel syndrome)   . Stroke ([place])   . Vitamin D deficiency    Past Surgical History:  Procedure Laterality Date  . CATARACT EXTRACTION, BILATERAL    . CHOLECYSTECTOMY    . COLONOSCOPY  2005  . ESOPHAGOGASTRODUODENOSCOPY (EGD) WITH PROPOFOL N/A 11/28/2017   Procedure: ESOPHAGOGASTRODUODENOSCOPY (EGD) WITH PROPOFOL;  Surgeon: Wonda Horner, MD;  Location: [facility] ENDOSCOPY;  Service: Endoscopy;  Laterality: N/A;  . TONSILLECTOMY    . UPPER GASTROINTESTINAL ENDOSCOPY  2005     Current Meds  Medication Sig  . Cholecalciferol (VITAMIN D3) 125 MCG (5000 UT) TABS Take 5,000-10,000 Units by mouth See admin instructions. Take two tablets (10,000 units) by mouth on Monday, Wednesday, Friday after supper, take one tablet (5,000 units) on Sunday, Tuesday, Thursday, Saturday after supper  . CINNAMON PO Take 1 capsule by mouth daily after breakfast.   . ciprofloxacin (CIPRO) 500 MG tablet Take 1 tablet (500 mg total) by mouth 2 (two) times daily for 7 days.  Marland Kitchen dicyclomine (BENTYL) 20 MG tablet Take 1 tablet 3 x day if needed for nausea, bloating, cramping or diarrhea  . feeding supplement, ENSURE ENLIVE, (ENSURE ENLIVE) LIQD Take 237 mLs by mouth 2 (two) times daily between meals.  . fluconazole (DIFLUCAN) 150 MG  tablet Take one tablet now and second tablet in three days for yeast.  . furosemide (LASIX) 20 MG tablet Take 1 tablet Daily for BP & Fluid (Patient taking differently: Take 20 mg by mouth daily after breakfast. for BP & Fluid)  . loperamide (IMODIUM) 2 MG capsule TAKE 1 TABLET AT NIGHT. (Patient taking differently: Take 2 mg by mouth daily after supper. )  . losartan (COZAAR) 50 MG tablet Take 1 tablet Daily for BP  .  metoprolol tartrate (LOPRESSOR) 25 MG tablet Take 1/2 tablet 2 x /day for BP (Patient taking differently: Take 12.5 mg by mouth 2 (two) times daily after a meal. for BP)  . ondansetron (ZOFRAN ODT) 4 MG disintegrating tablet Dissolve 1 tablet under tongue every 6 to 8 hours if needed for nausea & / or vomitting  . pantoprazole (PROTONIX) 40 MG tablet Take 1 tablet Daily for Heart burn & Indigestion (Patient taking differently: Take 40 mg by mouth daily after breakfast. for Heart burn & Indigestion)  . potassium chloride (K-DUR) 10 MEQ tablet Take 1 tablet Daily for Potassium Replacement (Patient taking differently: Take 10 mEq by mouth daily after supper. for Potassium Replacement)  . QUEtiapine (SEROQUEL) 25 MG tablet Take 1/2 tablet at Bedtime (Patient taking differently: Take 12.5 mg by mouth at bedtime. )  . warfarin (COUMADIN) 2 MG tablet TAKE 1 TABLET (2 MG) DAILY EXCEPT FOR 1.5 TABLETS (3MG ) ON MONDAY OR TAKE AS DIRECTED BY THE COUMADIN CLINIC     Allergies:   Ace inhibitors, Fosamax [alendronate sodium], Norvasc [amlodipine besylate], and Zocor [simvastatin]   Social History   Tobacco Use  . Smoking status: Never Smoker  . Smokeless tobacco: Never Used  Substance Use Topics  . Alcohol use: No  . Drug use: No     Family Hx: The patient's family history includes Arthritis in her sister and sister; Diabetes in her mother; Heart disease in her brother; Hypertension in her brother; Stroke in her mother.  ROS:   Please see the history of present illness.    All other systems reviewed and are negative.  Prior CV studies:   The following studies were reviewed today:   Labs/Other Tests and Data Reviewed:    EKG:  No ECG reviewed.  Recent Labs: 01/01/2019: Magnesium 2.0; TSH 3.98 02/28/2019: ALT 23 03/04/2019: BUN 16; Creat 0.86; Hemoglobin 13.6; Platelets 220; Potassium 3.7; Sodium 139   Recent Lipid Panel Lab Results  Component Value Date/Time   CHOL 208 (H) 09/26/2018 02:20 PM    TRIG 156 (H) 09/26/2018 02:20 PM   HDL 56 09/26/2018 02:20 PM   CHOLHDL 3.7 09/26/2018 02:20 PM   LDLCALC 125 (H) 09/26/2018 02:20 PM    Wt Readings from Last 3 Encounters:  03/28/19 113 lb (51.3 kg)  03/20/19 113 lb 9.6 oz (51.5 kg)  03/04/19 112 lb 6.4 oz (51 kg)    Objective:    Vital Signs:  BP 133/73   Pulse 63   Wt 113 lb (51.3 kg)   BMI 19.40 kg/m    VITAL SIGNS:  reviewed  ASSESSMENT & PLAN:    1. Chronic atrial fibrillation: rate controlled, on chronic warfarin, CHA2DS2-VASc score 7, she has no recent bleeding, her most recent hemoglobin on March 04, 2019 was 13.6.   2. H/o CVA 2010 -no aspirin or Plavix as she is on warfarin with a history of GI bleed.  3. Chronic diastolic HF: euvolemic on a low-dose of Lasix.  4. HTN: We do not have  measurements today but it will be checked at her Coumadin appointment later today.  5. HLD -April 2020 LDL 125 triglycerides 6, no statins given memory impairment.  COVID-19 Education: The signs and symptoms of COVID-19 were discussed with the patient and how to seek care for testing (follow up with PCP or arrange E-visit).  The importance of social distancing was discussed today.  Time:   Today, I have spent 15 minutes with the patient with telehealth technology discussing the above problems.    Medication Adjustments/Labs and Tests Ordered: Current medicines are reviewed at length with the patient today.  Concerns regarding medicines are outlined above.   Tests Ordered: No orders of the defined types were placed in this encounter.   Medication Changes: No orders of the defined types were placed in this encounter.   Follow Up:  Virtual Visit  in 6 month(s)  Signed, Tobias Alexander, MD  03/28/2019 11:06 AM    [facility]

## 2019-03-28 NOTE — Patient Instructions (Signed)
Medication Instructions:   Your physician recommends that you continue on your current medications as directed. Please refer to the Current Medication list given to you today.  *If you need a refill on your cardiac medications before your next appointment, please call your pharmacy*    Follow-Up: At [facility], you and your health needs are our priority.  As part of our continuing mission to provide you with exceptional heart care, we have created designated Provider Care Teams.  These Care Teams include your primary Cardiologist (physician) and Advanced Practice Providers (APPs -  Physician Assistants and Nurse Practitioners) who all work together to provide you with the care you need, when you need it.  Your next appointment:   6 month(s)  The format for your next appointment:   In Person  Provider:   Katarina Nelson, MD    

## 2019-04-04 ENCOUNTER — Ambulatory Visit (INDEPENDENT_AMBULATORY_CARE_PROVIDER_SITE_OTHER): Payer: Medicare Other

## 2019-04-04 ENCOUNTER — Other Ambulatory Visit: Payer: Self-pay

## 2019-04-04 DIAGNOSIS — Z8673 Personal history of transient ischemic attack (TIA), and cerebral infarction without residual deficits: Secondary | ICD-10-CM

## 2019-04-04 DIAGNOSIS — Z0001 Encounter for general adult medical examination with abnormal findings: Secondary | ICD-10-CM

## 2019-04-04 DIAGNOSIS — I4891 Unspecified atrial fibrillation: Secondary | ICD-10-CM | POA: Diagnosis not present

## 2019-04-04 LAB — POCT INR: INR: 3.6 — AB (ref 2.0–3.0)

## 2019-04-04 NOTE — Patient Instructions (Signed)
Description   Instructed patients son and pt to skip Saturday's dosage of Coumadin (2mg  purple tablet), then resume same dosage 2mg  daily except 3mg  on Mondays. Patient took dose this AM already. Recheck INR in 2 weeks.  Call us at [facility] # [phone], Main # [phone].  Started cipro for 7 days on 10/26- 2 doses of diflucan

## 2019-04-09 ENCOUNTER — Encounter: Payer: Self-pay | Admitting: Adult Health

## 2019-04-09 NOTE — Progress Notes (Signed)
MEDICARE ANNUAL WELLNESS VISIT AND FOLLOW UP  Assessment:    Medicare annual wellness visit, subsequent Due next year  Essential hypertension - continue medications BUT MONITOR AT HOME, WANT HIGHER BP DUE TO AGE, DASH diet, exercise and monitor at home. Call if greater than 150/90.  -     CBC with Differential/Platelet -     CMP/GFR -     TSH  Diet controlled T2DM (HCC)  - Discussed general issues about diabetes pathophysiology and management., Educational material distributed., Suggested low cholesterol diet., Encouraged aerobic exercise., Discussed foot care., Reminded to get yearly retinal exam. - A1C  Hyperlipidemia associated with T2DM (HCC)       -     Mild elevations; statins deferred in light of frailty, malnutrition and mild elevations only  -     Lipid panel -- continue medications, check lipids, decrease fatty foods, increase activity.   CKD 2 associated with T2DM (HCC) - Increase fluids, avoid NSAIDS, monitor sugars, will monitor - CMP/GFR  Atrial fibrillation, unspecified type (HCC)  - on coumadin continue follow cards - may need to stop at some point due to age, risk/benefits, discuss with cardiology   Diastolic congestive heart failure, chronic (HCC)  - weight stable, continue compression,continue medications, follow cards  Vitamin D deficiency Continue supplementation Check vitamin D level  Medication management -     Magnesium  APHASIA DUE TO CEREBROVASCULAR DISEASE  -Control blood pressure, cholesterol, glucose, increase exercise.   History of CVA - on coumadin, no ASA  -Control blood pressure, cholesterol, glucose, increase exercise.   Anxiety state  - controlled with medications  Gastroesophageal reflux disease, esophagitis presence not specified  -Continue PPI/H2 blocker, diet discussed  Hiatal hernia Poor candidate for surgery, conservative interventions only Lifestyle discussed, soft diet reviewed Present to ED for NGT and management for  any further intractable emesis  Atherosclerosis of Aorta By imaging, CT 02/28/2019 and several previous  -  Control blood pressure, cholesterol, glucose, increase exercise.   Vascular dementia (HCC); unsteady gait At [facility] for assistance with medication, bathing, compression hose No falls with consistent walker use Continue daily exercise   Protein calorie malnutrition, severe/BMI 19  Monitor weight, CMP/albumin, continue daily ensure/boost, mild weight gain encouraged  Over 30 minutes of exam, counseling, chart review, and critical decision making was performed  Future Appointments  Date Time Provider Department Center  04/18/2019  1:45 PM [facility]  07/15/2019 11:00 AM Lucky CowboyMcKeown, William, MD [facility] None    Plan:   During the course of the visit the patient was educated and counseled about appropriate screening and preventive services including:    Pneumococcal vaccine   Influenza vaccine  Td vaccine  Prevnar 13  Screening electrocardiogram  Screening mammography  Bone densitometry screening  Colorectal cancer screening  Diabetes screening  Glaucoma screening  Nutrition counseling   Advanced directives: given info/requested copies   Subjective:   Lisa Hopkins is a 85 y.o. female who presents for Medicare Annual Wellness Visit and 3 month follow up on hypertension, diabetes, hyperlipidemia, vitamin D def.   She is accompanied by her son today, Lisa AsperJames Perazzo Jr. Is POA, brother Lisa Hopkins also has POA.   She has afib, on coumadin, has history of CVA with poor ST recall and dysomnia. Follows with cardiology, is moderate-high fall risk, poor compliance with walker use.    She is at [facility], family pays a nurse to help with bathing, dressing, breakfast in the  AM, compression hose, they have medications compounded through [facility] in AM and PM packs, family calls to remind to take.  She uses walker consistently, no falls since starting with this.   She has known hiatal hernia and intermittently has been admitted due to intractable emesis; this year admitted for SBO in 06/2018 which resolved with conservative interventions Poor candidate for surgery, has been recommended conservative interventions only for hiatal hernia and to present to ED for NGT for intractable emesis  BMI is Body mass index is 19.22 kg/m., has trended down and recommended to do daily boost/ensure in between meals due to protein/calorie malnutrition. She does 30 min daily exercise program with [facility].  Wt Readings from Last 3 Encounters:  04/10/19 112 lb (50.8 kg)  03/28/19 113 lb (51.3 kg)  03/20/19 113 lb 9.6 oz (51.5 kg)   She has atherosclerosis of aorta on imaging (CT abd 02/28/2019) She has history of diastolic heart failure is on lasix and weight is stable without recent complications.  Her blood pressure has been controlled at home, today their BP is BP: 126/74   She does workout. She denies chest pain, shortness of breath, dizziness.    She is not on cholesterol medication secondary to age, frailty, malnutrition. Her cholesterol is not at goal of <100 but remains mildly elevated only. The cholesterol last visit was:   Lab Results  Component Value Date   CHOL 208 (H) 09/26/2018   HDL 56 09/26/2018   LDLCALC 125 (H) 09/26/2018   TRIG 156 (H) 09/26/2018   CHOLHDL 3.7 09/26/2018   She has been working on diet and exercise for diet controlled Diabetes (6.7% in 04/2015, 6.8% in 07/2015) with diabetic chronic kidney disease, she is not on bASA (on coumadin), she is on ACE/ARB, and denies polydipsia and polyuria. Last A1C was:  Lab Results  Component Value Date   HGBA1C 6.2 (H) 01/01/2019   She has stable CKD III monitored via this office:  Lab Results  Component Value Date   GFRNONAA 12 (L) 03/04/2019    Patient is on Vitamin D supplement due to hx of deficiency; has been at goal on  supplement:  Lab Results  Component Value Date   VD25OH 48 01/01/2019      Medication Review Current Outpatient Medications on File Prior to Visit  Medication Sig  . Cholecalciferol (VITAMIN D3) 125 MCG (5000 UT) TABS Take 5,000-10,000 Units by mouth See admin instructions. Take two tablets (10,000 units) by mouth on Monday, Wednesday, Friday after supper, take one tablet (5,000 units) on Sunday, Tuesday, Thursday, Saturday after supper  . CINNAMON PO Take 1 capsule by mouth daily after breakfast.   . dicyclomine (BENTYL) 20 MG tablet Take 1 tablet 3 x day if needed for nausea, bloating, cramping or diarrhea  . furosemide (LASIX) 20 MG tablet Take 1 tablet Daily for BP & Fluid (Patient taking differently: Take 20 mg by mouth daily after breakfast. for BP & Fluid)  . loperamide (IMODIUM) 2 MG capsule TAKE 1 TABLET AT NIGHT. (Patient taking differently: Take 2 mg by mouth daily after supper. )  . losartan (COZAAR) 50 MG tablet Take 1 tablet Daily for BP  . metoprolol tartrate (LOPRESSOR) 25 MG tablet Take 1/2 tablet 2 x /day for BP (Patient taking differently: Take 12.5 mg by mouth 2 (two) times daily after a meal. for BP)  . ondansetron (ZOFRAN ODT) 4 MG disintegrating tablet Dissolve 1 tablet under tongue every 6 to 8 hours if needed  for nausea & / or vomitting  . pantoprazole (PROTONIX) 40 MG tablet Take 1 tablet Daily for Heart burn & Indigestion (Patient taking differently: Take 40 mg by mouth daily after breakfast. for Heart burn & Indigestion)  . potassium chloride (K-DUR) 10 MEQ tablet Take 1 tablet Daily for Potassium Replacement (Patient taking differently: Take 10 mEq by mouth daily after supper. for Potassium Replacement)  . QUEtiapine (SEROQUEL) 25 MG tablet Take 1/2 tablet at Bedtime (Patient taking differently: Take 12.5 mg by mouth at bedtime. )  . warfarin (COUMADIN) 2 MG tablet TAKE 1 TABLET (2 MG) DAILY EXCEPT FOR 1.5 TABLETS ( ) ON MONDAY OR TAKE AS DIRECTED BY THE COUMADIN  CLINIC  . feeding supplement, ENSURE ENLIVE, (ENSURE ENLIVE) LIQD Take 237 mLs by mouth 2 (two) times daily between meals. (Patient not taking: Reported on 04/10/2019)   No current facility-administered medications on file prior to visit.     Allergies: Allergies  Allergen Reactions  . Ace Inhibitors Other (See Comments)    Cough  . Fosamax [Alendronate Sodium] Other (See Comments)    Heart burn  . Norvasc [Amlodipine Besylate] Other (See Comments)    edema  . Zocor [Simvastatin] Other (See Comments)    Elevated CPK    Current Problems (verified) has Anxiety state; Essential hypertension; Atrial fibrillation (HCC); History of CVA (cerebrovascular accident); APHASIA DUE TO CEREBROVASCULAR DISEASE; Hyperlipidemia associated with type 2 diabetes mellitus (HCC); Vitamin D deficiency; Diastolic CHF (HCC); GERD; Atherosclerosis of aorta (HCC); Diet-controlled type 2 diabetes mellitus (HCC); CKD stage 2 due to type 2 diabetes mellitus (HCC); Hiatal hernia; Encounter for general adult medical examination with abnormal findings; Protein-calorie malnutrition, severe; and Vascular dementia without behavioral disturbance (HCC) on their problem list.  Screening Tests Immunization History  Administered Date(s) Administered  . DT 02/18/2015  . Influenza, High Dose Seasonal PF 03/05/2014, 02/18/2015, 03/16/2016, 03/05/2017, 03/04/2019  . Influenza-Unspecified 01/30/2013, 02/27/2018  . PPD Test 10/26/2017  . Pneumococcal-Unspecified 04/27/2004  . Td 06/28/2003  . Zoster 04/27/2009    Preventative care: Last colonoscopy: 2007, will not get another due to age Last mammogram: 03/2015, DONE Last pap smear/pelvic exam: remote   DEXA: Jan 17 2008, declines further  Echo 2011  Prior vaccinations: TD or Tdap: 2016  Influenza: 2020 Pneumococcal: 2005 Prevnar13: DECLINES Shingles/Zostavax: 2010  Names of Other Physician/Practitioners you currently use: 1. [facility]- here for primary care 2. Elmer Picker, eye doctor, last visit 06/16/2017 - report verified and abstracted, declines follow up at this time due to pandemic  3. Dr. Linde Gillis, dentist, last visit 2019  Patient Care Team: Lucky Cowboy, MD as PCP - General (Internal Medicine) Lars Masson, MD as PCP - Cardiology (Cardiology) Mateo Flow, MD as Consulting Physician (Ophthalmology) Lars Masson, MD as Consulting Physician (Cardiology) Bernette Redbird, MD as Consulting Physician (Gastroenterology) Cherlyn Roberts, MD as Consulting Physician (Dermatology) Richardean Chimera, MD as Consulting Physician (Obstetrics and Gynecology)  Surgical: She  has a past surgical history that includes Upper gastrointestinal endoscopy (2005); Colonoscopy (2005); Cholecystectomy; Cataract extraction, bilateral; Tonsillectomy; and Esophagogastroduodenoscopy (egd) with propofol (N/A, 11/28/2017). Family Her family history includes Arthritis in her sister and sister; Diabetes in her mother; Heart disease in her brother; Hypertension in her brother; Stroke in her mother. Social history  She reports that she has never smoked. She has never used smokeless tobacco. She reports that she does not drink alcohol or use drugs.  MEDICARE WELLNESS OBJECTIVES: Physical activity: Current Exercise Habits: Home exercise routine,  Type of exercise: strength training/weights;stretching;walking, Time (Minutes): 30, Frequency (Times/Week): 6, Weekly Exercise (Minutes/Week): 180, Intensity: Mild, Exercise limited by: None identified Cardiac risk factors: Cardiac Risk Factors include: advanced age (>61men, >71 women);hypertension;dyslipidemia;diabetes mellitus Depression/mood screen:   Depression screen [facility] 2/9 04/10/2019  Decreased Interest 0  Down, Depressed, Hopeless 0  PHQ - 2 Score 0  Some recent data might be hidden    ADLs:  In your present state of health, do you have any difficulty performing the  following activities: 04/10/2019 12/31/2018  Hearing? Y N  Comment has bilateral hearing aids, doesn't like to wear -  Vision? N N  Difficulty concentrating or making decisions? Y N  Walking or climbing stairs? Y N  Comment needs assistance for stairs; manages well in current living -  Dressing or bathing? Y N  Comment has hired Marine scientist to assist -  Doing errands, shopping? Y N  Comment son drives -  Conservation officer, nature and eating ? N -  Using the Toilet? N -  Comment has beside commode -  In the past six months, have you accidently leaked urine? N -  Do you have problems with loss of bowel control? N -  Managing your Medications? Y -  Comment has [place] compounded -  Managing your Finances? Y -  Comment family assists -  Housekeeping or managing your Housekeeping? N -  Comment [facility] completes -  Some recent data might be hidden    Cognitive Testing  Alert? Yes  Normal Appearance?Yes  Oriented to person? Yes  Place? Yes   Time? Yes  Recall of three objects?  1/3   Can perform simple calculations? no  Displays appropriate judgment? No, looks to son to make choices  Can read the correct time from a watch face?Yes  EOL planning: Does Patient Have a Medical Advance Directive?: Yes Type of Advance Directive: Healthcare Power of Attorney, Living will Does patient want to make changes to medical advance directive?: No - Patient declined Copy of [place] in Chart?: No - copy requested   Objective:   Today's Vitals   04/10/19 1408  BP: 126/74  Pulse: 64  Temp: 97.9 F (36.6 C)  SpO2: 97%  Weight: 112 lb (50.8 kg)   Body mass index is 19.22 kg/m.  General appearance: alert, no distress, WD/WN,  female HEENT: normocephalic, sclerae anicteric, TMs pearly, nares patent, no discharge or erythema, pharynx normal, somewhat HOH (not wearing hearing aids) Oral cavity: MMM, no lesions Neck: supple, no lymphadenopathy, no thyromegaly, no masses Heart: Irreg  irreg, normal S1, S2, no murmurs Lungs: CTA bilaterally, no wheezes, rhonchi, or rales Abdomen: +bs, soft, non tender, non distended, no masses, no hepatomegaly, no splenomegaly Musculoskeletal: nontender, no swelling, no obvious deformity Extremities: wears bilateral compression, no warmth/redness/swelling, no cyanosis, no clubbing Pulses: 2+ symmetric, upper and lower extremities, normal cap refill Neurological: alert, oriented x 3, CN2-12 intact, strength normal upper extremities and lower extremities, sensation intact bil feet to monofilament, DTRs 2+ throughout, no cerebellar signs, gait slow with walker, poor judgement/recall Psychiatric: normal affect, behavior normal, anomia, pleasant  Breast: defer Gyn: defer Rectal: defer   Medicare Attestation I have personally reviewed: The patient's medical and social history Their use of alcohol, tobacco or illicit drugs Their current medications and supplements The patient's functional ability including ADLs,fall risks, home safety risks, cognitive, and hearing and visual impairment Diet and physical activities Evidence for depression or mood disorders  The patient's weight, height, BMI, and  visual acuity have been recorded in the chart.  I have made referrals, counseling, and provided education to the patient based on review of the above and I have provided the patient with a written personalized care plan for preventive services.     Dan Maker, NP   04/10/2019

## 2019-04-10 ENCOUNTER — Ambulatory Visit (INDEPENDENT_AMBULATORY_CARE_PROVIDER_SITE_OTHER): Payer: Medicare Other | Admitting: Adult Health

## 2019-04-10 ENCOUNTER — Other Ambulatory Visit: Payer: Self-pay

## 2019-04-10 ENCOUNTER — Encounter: Payer: Self-pay | Admitting: Adult Health

## 2019-04-10 VITALS — BP 126/74 | HR 64 | Temp 97.9°F | Wt 112.0 lb

## 2019-04-10 DIAGNOSIS — E1169 Type 2 diabetes mellitus with other specified complication: Secondary | ICD-10-CM

## 2019-04-10 DIAGNOSIS — I69920 Aphasia following unspecified cerebrovascular disease: Secondary | ICD-10-CM

## 2019-04-10 DIAGNOSIS — K219 Gastro-esophageal reflux disease without esophagitis: Secondary | ICD-10-CM | POA: Diagnosis not present

## 2019-04-10 DIAGNOSIS — I10 Essential (primary) hypertension: Secondary | ICD-10-CM | POA: Diagnosis not present

## 2019-04-10 DIAGNOSIS — E119 Type 2 diabetes mellitus without complications: Secondary | ICD-10-CM

## 2019-04-10 DIAGNOSIS — Z8673 Personal history of transient ischemic attack (TIA), and cerebral infarction without residual deficits: Secondary | ICD-10-CM

## 2019-04-10 DIAGNOSIS — I700 Atherosclerosis of aorta: Secondary | ICD-10-CM | POA: Diagnosis not present

## 2019-04-10 DIAGNOSIS — Z0001 Encounter for general adult medical examination with abnormal findings: Secondary | ICD-10-CM

## 2019-04-10 DIAGNOSIS — I4811 Longstanding persistent atrial fibrillation: Secondary | ICD-10-CM

## 2019-04-10 DIAGNOSIS — E785 Hyperlipidemia, unspecified: Secondary | ICD-10-CM

## 2019-04-10 DIAGNOSIS — R6889 Other general symptoms and signs: Secondary | ICD-10-CM

## 2019-04-10 DIAGNOSIS — N182 Chronic kidney disease, stage 2 (mild): Secondary | ICD-10-CM

## 2019-04-10 DIAGNOSIS — K449 Diaphragmatic hernia without obstruction or gangrene: Secondary | ICD-10-CM

## 2019-04-10 DIAGNOSIS — F411 Generalized anxiety disorder: Secondary | ICD-10-CM

## 2019-04-10 DIAGNOSIS — E559 Vitamin D deficiency, unspecified: Secondary | ICD-10-CM

## 2019-04-10 DIAGNOSIS — Z0000 Encounter for general adult medical examination without abnormal findings: Secondary | ICD-10-CM

## 2019-04-10 DIAGNOSIS — Z681 Body mass index (BMI) 19 or less, adult: Secondary | ICD-10-CM

## 2019-04-10 DIAGNOSIS — E43 Unspecified severe protein-calorie malnutrition: Secondary | ICD-10-CM

## 2019-04-10 DIAGNOSIS — E1122 Type 2 diabetes mellitus with diabetic chronic kidney disease: Secondary | ICD-10-CM

## 2019-04-10 DIAGNOSIS — F0150 Vascular dementia without behavioral disturbance: Secondary | ICD-10-CM

## 2019-04-10 DIAGNOSIS — I5032 Chronic diastolic (congestive) heart failure: Secondary | ICD-10-CM | POA: Diagnosis not present

## 2019-04-10 NOTE — Patient Instructions (Addendum)
Lisa Hopkins , Thank you for taking time to come for your Medicare Wellness Visit. I appreciate your ongoing commitment to your health goals. Please review the following plan we discussed and let me know if I can assist you in the future.   These are the goals we discussed: Goals    . Blood Pressure < 140/90       This is a list of the screening recommended for you and due dates:  Health Maintenance  Topic Date Due  . Eye exam for diabetics  06/26/2018  . Hemoglobin A1C  07/04/2019  . Complete foot exam   04/09/2020  . Tetanus Vaccine  02/17/2025  . Flu Shot  Completed  . DEXA scan (bone density measurement)  Completed  . Pneumonia vaccines  Discontinued      Dysphagia Eating Plan, Bite Size Food This diet plan is for people with moderate swallowing problems who have transitioned from pureed and minced foods. Bite size foods are soft and cut into small chunks so that they can be swallowed safely. On this eating plan, you may be instructed to drink liquids that are thickened. Work with your health care provider and your diet and nutrition specialist (dietitian) to make sure that you are following the diet safely and getting all the nutrients you need. What are tips for following this plan? General guidelines for foods   You may eat foods that are tender, soft, and moist.  Always test food texture before taking a bite. Poke food with a fork or spoon to make sure it is tender.  Food should be easy to cut and shew. Avoid large pieces of food that require a lot of chewing.  Take small bites. Each bite should be smaller than your thumb nail (about 27mm by 15 mm).  If you were on pureed and minced food diet plans, you may eat any of the foods included in those diets.  Avoid foods that are very dry, hard, sticky, chewy, coarse, or crunchy.  If instructed by your health care provider, thicken liquids. Follow your health care provider's instructions for what products to use, how to  do this, and to what thickness. ? Your health care provider may recommend using a commercial thickener, rice cereal, or potato flakes. Ask your health care provider to recommend thickeners. ? Thickened liquids are usually a "pudding-like" consistency, or they may be as thick as honey or thick enough to eat with a spoon. Cooking  To moisten foods, you may add liquids while you are blending, mashing, or grinding your foods to the right consistency. These liquids include gravies, sauces, vegetable or fruit juice, milk, half and half, or water.  Strain extra liquid from foods before eating.  Reheat foods slowly to prevent a tough crust from forming.  Prepare foods in advance. Meal planning  Eat a variety of foods to get all the nutrients you need.  Some foods may be tolerated better than others. Work with your dietitian to identify which foods are safest for you to eat.  Follow your meal plan as told by your dietitian. What foods are allowed? Grains Moist breads without nuts or seeds. Biscuits, muffins, pancakes, and waffles that are well-moistened with syrup, jelly, margarine, or butter. Cooked cereals. Moist bread stuffing. Moist rice. Well-moistened cold cereal with small chunks. Well-cooked pasta, noodles, rice, and bread dressing in small pieces and thick sauce. Soft dumplings or spaetzle in small pieces and butter or gravy. Vegetables Soft, well-cooked vegetables in small pieces. Soft-cooked,  mashed potatoes. Thickened vegetable juice. Fruits Canned or cooked fruits that are soft or moist and do not have skin or seeds. Fresh, soft bananas. Thickened fruit juices. Meat and other protein foods Tender, moist meats or poultry in small pieces. Moist meatballs or meatloaf. Fish without bones. Eggs or egg substitutes in small pieces. Tofu. Tempeh and meat alternatives in small pieces. Well-cooked, tender beans, peas, baked beans, and other legumes. Dairy Thickened milk. Cream cheese. Yogurt.  Cottage cheese. Sour cream. Small pieces of soft cheese. Fats and oils Butter. Oils. Margarine. Mayonnaise. Gravy. Spreads. Sweets and desserts Soft, smooth, moist desserts. Pudding. Custard. Moist cakes. Jam. Jelly. Honey. Preserves. Ask your health care provider whether you can have frozen desserts. Seasoning and other foods All seasonings and sweeteners. All sauces with small chunks. Prepared tuna, egg, or chicken salad without raw fruits or vegetables. Moist casseroles with small, tender pieces of meat. Soups with tender meat. What foods are not allowed? Grains Coarse or dry cereals. Dry breads. Toast. Crackers. Tough, crusty breads, such as [place] bread and baguettes. Dry pancakes, waffles, and muffins. Sticky rice. Dry bread stuffing. Granola. Popcorn. Chips. Vegetables All raw vegetables. Cooked corn. Rubbery or stiff cooked vegetables. Stringy vegetables, such as celery. Tough, crisp fried potatoes. Potato skins. Fruits Hard, crunchy, stringy, high-pulp, and juicy raw fruits such as apples, pineapple, papaya, and watermelon. Small, round fruits, such as grapes. Dried fruit and fruit leather. Meat and other protein foods Large pieces of meat. Dry, tough meats, such as bacon, sausage, and hot dogs. Chicken, [place], or fish with skin and bones. Crunchy peanut butter. Nuts. Seeds. Nut and seed butters. Dairy Yogurt with nuts, seeds, or large chunks. Large chunks of cheese. Frozen desserts and milk consistency not allowed by your dietitian. Sweets and desserts Dry cakes. Chewy or dry cookies. Any desserts with nuts, seeds, dry fruits, coconut, pineapple, or anything dry, sticky, or hard. Chewy caramel. Licorice. Taffy-type candies. Ask your health care provider whether you can have frozen desserts. Seasoning and other foods Soups with tough or large chunks of meats, poultry, or vegetables. Corn or clam chowder. Smoothies with large chunks of fruit. Summary  Bite size foods can be helpful  for people with moderate swallowing problems.  On the dysphagia eating plan, you may eat foods that are soft, moist, and cut into pieces smaller than 23mm by 88mm.  You may be instructed to thicken liquids. Follow your health care provider's instructions about how to do this and to what consistency. This information is not intended to replace advice given to you by your health care provider. Make sure you discuss any questions you have with your health care provider. Document Released: 05/15/2005 Document Revised: 09/05/2018 Document Reviewed: 08/25/2016 Elsevier Patient Education  2020 [employer].

## 2019-04-11 LAB — CBC WITH DIFFERENTIAL/PLATELET
Absolute Monocytes: 540 cells/uL (ref 200–950)
Basophils Absolute: 52 cells/uL (ref 0–200)
Basophils Relative: 0.8 %
Eosinophils Absolute: 143 cells/uL (ref 15–500)
Eosinophils Relative: 2.2 %
HCT: 41 % (ref 35.0–45.0)
Hemoglobin: 13.5 g/dL (ref 11.7–15.5)
Lymphs Abs: 2886 cells/uL (ref 850–3900)
MCH: 30.4 pg (ref 27.0–33.0)
MCHC: 32.9 g/dL (ref 32.0–36.0)
MCV: 92.3 fL (ref 80.0–100.0)
MPV: 10.2 fL (ref 7.5–12.5)
Monocytes Relative: 8.3 %
Neutro Abs: 2880 cells/uL (ref 1500–7800)
Neutrophils Relative %: 44.3 %
Platelets: 233 10*3/uL (ref 140–400)
RBC: 4.44 10*6/uL (ref 3.80–5.10)
RDW: 12.4 % (ref 11.0–15.0)
Total Lymphocyte: 44.4 %
WBC: 6.5 10*3/uL (ref 3.8–10.8)

## 2019-04-11 LAB — COMPLETE METABOLIC PANEL WITH GFR
AG Ratio: 1.5 (calc) (ref 1.0–2.5)
ALT: 13 U/L (ref 6–29)
AST: 17 U/L (ref 10–35)
Albumin: 4 g/dL (ref 3.6–5.1)
Alkaline phosphatase (APISO): 95 U/L (ref 37–153)
BUN/Creatinine Ratio: 14 (calc) (ref 6–22)
BUN: 14 mg/dL (ref 7–25)
CO2: 29 mmol/L (ref 20–32)
Calcium: 9.7 mg/dL (ref 8.6–10.4)
Chloride: 102 mmol/L (ref 98–110)
Creat: 0.99 mg/dL — ABNORMAL HIGH (ref 0.60–0.88)
GFR, Est African American: 57 mL/min/{1.73_m2} — ABNORMAL LOW (ref >=60)
GFR, Est Non African American: 49 mL/min/{1.73_m2} — ABNORMAL LOW (ref >=60)
Globulin: 2.6 g/dL (calc) (ref 1.9–3.7)
Glucose, Bld: 119 mg/dL — ABNORMAL HIGH (ref 65–99)
Potassium: 4.4 mmol/L (ref 3.5–5.3)
Sodium: 139 mmol/L (ref 135–146)
Total Bilirubin: 0.6 mg/dL (ref 0.2–1.2)
Total Protein: 6.6 g/dL (ref 6.1–8.1)

## 2019-04-11 LAB — LIPID PANEL
Cholesterol: 179 mg/dL (ref <200)
HDL: 50 mg/dL (ref >=50)
LDL Cholesterol (Calc): 106 mg/dL (calc) — ABNORMAL HIGH
Non-HDL Cholesterol (Calc): 129 mg/dL (calc) (ref <130)
Total CHOL/HDL Ratio: 3.6 (calc) (ref <5.0)
Triglycerides: 124 mg/dL (ref <150)

## 2019-04-11 LAB — HEMOGLOBIN A1C
Hgb A1c MFr Bld: 6.1 % of total Hgb — ABNORMAL HIGH (ref <5.7)
Mean Plasma Glucose: 128 (calc)
eAG (mmol/L): 7.1 (calc)

## 2019-04-11 LAB — MAGNESIUM: Magnesium: 1.9 mg/dL (ref 1.5–2.5)

## 2019-04-11 LAB — TSH: TSH: 0.63 mIU/L (ref 0.40–4.50)

## 2019-04-14 ENCOUNTER — Other Ambulatory Visit: Payer: Self-pay | Admitting: Internal Medicine

## 2019-04-14 ENCOUNTER — Other Ambulatory Visit: Payer: Self-pay | Admitting: Cardiology

## 2019-04-18 ENCOUNTER — Ambulatory Visit: Payer: Medicare Other | Admitting: Pharmacist

## 2019-04-18 ENCOUNTER — Other Ambulatory Visit: Payer: Self-pay

## 2019-04-18 DIAGNOSIS — Z0001 Encounter for general adult medical examination with abnormal findings: Secondary | ICD-10-CM

## 2019-04-18 DIAGNOSIS — Z8673 Personal history of transient ischemic attack (TIA), and cerebral infarction without residual deficits: Secondary | ICD-10-CM | POA: Diagnosis not present

## 2019-04-18 DIAGNOSIS — I4891 Unspecified atrial fibrillation: Secondary | ICD-10-CM

## 2019-04-18 LAB — POCT INR: INR: 2.9 (ref 2.0–3.0)

## 2019-04-18 NOTE — Patient Instructions (Signed)
Instructed patients son and pt to continue same dosage 2mg  daily except 3mg  on Mondays. Recheck INR in 3 weeks.  Call us at [facility] # [phone], Main # [phone].

## 2019-05-08 ENCOUNTER — Ambulatory Visit: Payer: Medicare Other | Admitting: *Deleted

## 2019-05-08 ENCOUNTER — Other Ambulatory Visit: Payer: Self-pay

## 2019-05-08 DIAGNOSIS — Z0001 Encounter for general adult medical examination with abnormal findings: Secondary | ICD-10-CM

## 2019-05-08 DIAGNOSIS — I4891 Unspecified atrial fibrillation: Secondary | ICD-10-CM | POA: Diagnosis not present

## 2019-05-08 DIAGNOSIS — Z8673 Personal history of transient ischemic attack (TIA), and cerebral infarction without residual deficits: Secondary | ICD-10-CM

## 2019-05-08 LAB — POCT INR: INR: 3.2 — AB (ref 2.0–3.0)

## 2019-05-08 NOTE — Patient Instructions (Signed)
Description   Instructed patients son and pt to take 1mg  (1/2 of a 2mg  tablet) today then continue same dosage 2mg  daily except 3mg  on Mondays. Recheck INR in 3 weeks.  Call us at [facility] # [phone], Main # [phone].

## 2019-05-19 DIAGNOSIS — Z1159 Encounter for screening for other viral diseases: Secondary | ICD-10-CM | POA: Diagnosis not present

## 2019-05-19 DIAGNOSIS — Z20828 Contact with and (suspected) exposure to other viral communicable diseases: Secondary | ICD-10-CM | POA: Diagnosis not present

## 2019-05-26 DIAGNOSIS — Z20828 Contact with and (suspected) exposure to other viral communicable diseases: Secondary | ICD-10-CM | POA: Diagnosis not present

## 2019-05-26 DIAGNOSIS — Z1159 Encounter for screening for other viral diseases: Secondary | ICD-10-CM | POA: Diagnosis not present

## 2019-05-28 ENCOUNTER — Other Ambulatory Visit: Payer: Self-pay

## 2019-05-28 ENCOUNTER — Ambulatory Visit (INDEPENDENT_AMBULATORY_CARE_PROVIDER_SITE_OTHER): Payer: Medicare Other | Admitting: *Deleted

## 2019-05-28 DIAGNOSIS — Z79899 Other long term (current) drug therapy: Secondary | ICD-10-CM

## 2019-05-28 DIAGNOSIS — Z0001 Encounter for general adult medical examination with abnormal findings: Secondary | ICD-10-CM

## 2019-05-28 DIAGNOSIS — I4891 Unspecified atrial fibrillation: Secondary | ICD-10-CM | POA: Diagnosis not present

## 2019-05-28 DIAGNOSIS — Z8673 Personal history of transient ischemic attack (TIA), and cerebral infarction without residual deficits: Secondary | ICD-10-CM

## 2019-05-28 LAB — POCT INR: INR: 3.7 — AB (ref 2.0–3.0)

## 2019-05-28 NOTE — Patient Instructions (Signed)
Description   Instructed patients son and pt to hold tomorrow's dose then start taking 2mg  daily. Recheck INR in 3 weeks.  Call us at [facility] # [phone], Main # [phone].

## 2019-06-02 DIAGNOSIS — Z1159 Encounter for screening for other viral diseases: Secondary | ICD-10-CM | POA: Diagnosis not present

## 2019-06-02 DIAGNOSIS — Z20828 Contact with and (suspected) exposure to other viral communicable diseases: Secondary | ICD-10-CM | POA: Diagnosis not present

## 2019-06-05 DIAGNOSIS — Z20828 Contact with and (suspected) exposure to other viral communicable diseases: Secondary | ICD-10-CM | POA: Diagnosis not present

## 2019-06-05 DIAGNOSIS — Z1159 Encounter for screening for other viral diseases: Secondary | ICD-10-CM | POA: Diagnosis not present

## 2019-06-09 DIAGNOSIS — Z20828 Contact with and (suspected) exposure to other viral communicable diseases: Secondary | ICD-10-CM | POA: Diagnosis not present

## 2019-06-09 DIAGNOSIS — Z1159 Encounter for screening for other viral diseases: Secondary | ICD-10-CM | POA: Diagnosis not present

## 2019-06-11 ENCOUNTER — Other Ambulatory Visit: Payer: Self-pay

## 2019-06-11 ENCOUNTER — Ambulatory Visit (INDEPENDENT_AMBULATORY_CARE_PROVIDER_SITE_OTHER): Payer: Medicare Other | Admitting: *Deleted

## 2019-06-11 DIAGNOSIS — Z0001 Encounter for general adult medical examination with abnormal findings: Secondary | ICD-10-CM

## 2019-06-11 DIAGNOSIS — Z8673 Personal history of transient ischemic attack (TIA), and cerebral infarction without residual deficits: Secondary | ICD-10-CM

## 2019-06-11 DIAGNOSIS — Z79899 Other long term (current) drug therapy: Secondary | ICD-10-CM

## 2019-06-11 DIAGNOSIS — I4891 Unspecified atrial fibrillation: Secondary | ICD-10-CM

## 2019-06-11 LAB — POCT INR: INR: 2.2 (ref 2.0–3.0)

## 2019-06-11 NOTE — Patient Instructions (Addendum)
Description   Continue taking 2mg  daily. Recheck INR in 4 weeks.  Call at [facility] # [phone], Main # [phone].

## 2019-06-12 ENCOUNTER — Encounter: Payer: Self-pay | Admitting: Internal Medicine

## 2019-06-12 DIAGNOSIS — Z20828 Contact with and (suspected) exposure to other viral communicable diseases: Secondary | ICD-10-CM | POA: Diagnosis not present

## 2019-06-12 DIAGNOSIS — Z1159 Encounter for screening for other viral diseases: Secondary | ICD-10-CM | POA: Diagnosis not present

## 2019-06-16 DIAGNOSIS — Z20828 Contact with and (suspected) exposure to other viral communicable diseases: Secondary | ICD-10-CM | POA: Diagnosis not present

## 2019-06-16 DIAGNOSIS — Z1159 Encounter for screening for other viral diseases: Secondary | ICD-10-CM | POA: Diagnosis not present

## 2019-06-23 DIAGNOSIS — Z1159 Encounter for screening for other viral diseases: Secondary | ICD-10-CM | POA: Diagnosis not present

## 2019-06-23 DIAGNOSIS — Z20828 Contact with and (suspected) exposure to other viral communicable diseases: Secondary | ICD-10-CM | POA: Diagnosis not present

## 2019-07-04 ENCOUNTER — Other Ambulatory Visit: Payer: Self-pay | Admitting: Internal Medicine

## 2019-07-04 DIAGNOSIS — I10 Essential (primary) hypertension: Secondary | ICD-10-CM

## 2019-07-04 MED ORDER — LOSARTAN POTASSIUM 50 MG PO TABS: ORAL_TABLET | ORAL | 3 refills | Status: DC

## 2019-07-08 ENCOUNTER — Other Ambulatory Visit: Payer: Self-pay | Admitting: Cardiology

## 2019-07-08 ENCOUNTER — Other Ambulatory Visit: Payer: Self-pay | Admitting: Internal Medicine

## 2019-07-08 DIAGNOSIS — I5032 Chronic diastolic (congestive) heart failure: Secondary | ICD-10-CM

## 2019-07-08 DIAGNOSIS — I10 Essential (primary) hypertension: Secondary | ICD-10-CM

## 2019-07-09 ENCOUNTER — Ambulatory Visit (INDEPENDENT_AMBULATORY_CARE_PROVIDER_SITE_OTHER): Payer: Medicare Other | Admitting: *Deleted

## 2019-07-09 ENCOUNTER — Other Ambulatory Visit: Payer: Self-pay

## 2019-07-09 DIAGNOSIS — Z5181 Encounter for therapeutic drug level monitoring: Secondary | ICD-10-CM

## 2019-07-09 DIAGNOSIS — I4891 Unspecified atrial fibrillation: Secondary | ICD-10-CM

## 2019-07-09 DIAGNOSIS — Z79899 Other long term (current) drug therapy: Secondary | ICD-10-CM

## 2019-07-09 LAB — POCT INR: INR: 2.9 (ref 2.0–3.0)

## 2019-07-09 NOTE — Patient Instructions (Addendum)
  Description   Continue taking 2mg  daily. Recheck INR in 5 weeks.  Call at [facility] # [phone], Main # [phone].

## 2019-07-14 ENCOUNTER — Encounter: Payer: Self-pay | Admitting: Internal Medicine

## 2019-07-14 NOTE — Progress Notes (Signed)
Annual Screening/Preventative Visit & Comprehensive Evaluation &  Examination     This very nice 86 y.o. WWF  presents for a Screening / Preventative Visit & comprehensive evaluation and management of multiple medical co-morbidities.  Patient has been followed for HTN, ASCVD/TIA's, HLD, vascular Dementia, T2_NIDDM  and Vitamin D Deficiency.      HTN predates since 76. Patient's BP has been controlled at home and patient denies any cardiac symptoms as chest pain, palpitations, shortness of breath, dizziness or ankle swelling. In 2011, patient had an embolic CVA and has been on Coumadin since for Afib. Today's BP is at goal - 112/60.   Consequent sequela of her CVA are dysnomia, poor short term recall, attributed in part to Vascular Dementia. Patient is felt to have an unstable gait and has been advised to use a walk (which she refuses). She is considered high fall risk.      Patient's hyperlipidemia is near controlled with diet. Last lipids were near  goal & not treated aggressively for age considerations:  Lab Results  Component Value Date   CHOL 179 04/10/2019   HDL 50 04/10/2019   LDLCALC 106 (H) 04/10/2019   TRIG 124 04/10/2019   CHOLHDL 3.6 04/10/2019       Patient has hx/o T2_NIDDM w/CKD2 predating circa 2003  And for which she's attempted control by diet.       and patient denies reactive hypoglycemic symptoms, visual blurring, diabetic polys or paresthesias. Last A1c was not at goal:  Lab Results  Component Value Date   HGBA1C 6.1 (H) 04/10/2019       Finally, patient has history of Vitamin D Deficiency ("35" / 2011) and last Vitamin D was not at goal (70-100):   Lab Results  Component Value Date   VD25OH 48 01/01/2019    Current Outpatient Medications on File Prior to Visit  Medication Sig  . Cholecalciferol (VITAMIN D3) 125 MCG (5000 UT) TABS Take 5,000-10,000 Units by mouth See admin instructions. Take two tablets (10,000 units) by mouth on Monday, Wednesday, Friday  after supper, take one tablet (5,000 units) on Sunday, Tuesday, Thursday, Saturday after supper  . CINNAMON PO Take 1 capsule by mouth daily after breakfast.   . dicyclomine (BENTYL) 20 MG tablet Take 1 tablet 3 x day if needed for nausea, bloating, cramping or diarrhea  . feeding supplement, ENSURE ENLIVE, (ENSURE ENLIVE) LIQD Take 237 mLs by mouth 2 (two) times daily between meals.  . furosemide (LASIX) 20 MG tablet Take 1 tablet Daily  for BP & Fluid Retention / Ankle Swelling.  . loperamide (IMODIUM) 2 MG capsule TAKE 1 TABLET AT NIGHT. (Patient taking differently: Take 2 mg by mouth daily after supper. )  . losartan (COZAAR) 50 MG tablet Take 1 tablet Daily for BP  . metoprolol tartrate (LOPRESSOR) 25 MG tablet Take 1/2 tablet 2 x /day for BP (Patient taking differently: Take 12.5 mg by mouth 2 (two) times daily after a meal. for BP)  . ondansetron (ZOFRAN ODT) 4 MG disintegrating tablet Dissolve 1 tablet under tongue every 6 to 8 hours if needed for nausea & / or vomitting  . pantoprazole (PROTONIX) 40 MG tablet Take 1 tablet Daily for Heart burn & Indigestion (Patient taking differently: Take 40 mg by mouth daily after breakfast. for Heart burn & Indigestion)  . potassium chloride (KLOR-CON) 10 MEQ tablet Take 1 tablet daily for Potassium  . QUEtiapine (SEROQUEL) 25 MG tablet Take 1/2 tablet at Bedtime (Patient taking differently: Take  12.5 mg by mouth at bedtime. )  . warfarin (COUMADIN) 2 MG tablet TAKE 1 TABLET EACH DAY OR AS DIRECTED BY THE COUMADIN CLINIC  . warfarin (COUMADIN) 3 MG tablet TAKE 1 TABLET (3 MG) ON MONDAYS ONLY   No current facility-administered medications on file prior to visit.   Allergies  Allergen Reactions  . Ace Inhibitors Other (See Comments)    Cough  . Fosamax [Alendronate Sodium] Other (See Comments)    Heart burn  . Norvasc [Amlodipine Besylate] Other (See Comments)    edema  . Zocor [Simvastatin] Other (See Comments)    Elevated CPK   Past Medical  History:  Diagnosis Date  . Anxiety   . Aphasia as late effect of cerebrovascular accident   . Atrial fibrillation (HCC)   . CKD (chronic kidney disease), stage II   . CVA (cerebral infarction)   . Diabetes mellitus type II   . DVT, lower extremity, distal, acute, right (HCC) 03/25/2018   Right peroneal vein 03/22/2018  . GERD (gastroesophageal reflux disease)   . Hiatal hernia   . HTN (hypertension)   . Hyperlipidemia   . IBS (irritable bowel syndrome)   . Stroke (HCC)   . Vitamin D deficiency    Health Maintenance  Topic Date Due  . OPHTHALMOLOGY EXAM  06/26/2018  . HEMOGLOBIN A1C  10/08/2019  . FOOT EXAM  07/13/2020  . TETANUS/TDAP  02/17/2025  . INFLUENZA VACCINE  Completed  . DEXA SCAN  Completed  . PNA vac Low Risk Adult  Discontinued   Immunization History  Administered Date(s) Administered  . DT (Pediatric) 02/18/2015  . Influenza, High Dose Seasonal PF 03/05/2014, 02/18/2015, 03/16/2016, 03/05/2017, 03/04/2019  . Influenza-Unspecified 01/30/2013, 02/27/2018  . PPD Test 10/26/2017  . Pneumococcal-Unspecified 04/27/2004  . Td 06/28/2003  . Zoster 04/27/2009    Last Colon - 11/25/2005 - Normal   Last MGM - 04/13/2015  Past Surgical History:  Procedure Laterality Date  . CATARACT EXTRACTION, BILATERAL    . CHOLECYSTECTOMY    . COLONOSCOPY  2005  . ESOPHAGOGASTRODUODENOSCOPY (EGD) WITH PROPOFOL N/A 11/28/2017   Procedure: ESOPHAGOGASTRODUODENOSCOPY (EGD) WITH PROPOFOL;  Surgeon: Graylin Shiver, MD;  Location[facility] ENDOSCOPY;  Service: Endoscopy;  Laterality: N/A;  . TONSILLECTOMY    . UPPER GASTROINTESTINAL ENDOSCOPY  2005   Family History  Problem Relation Age of Onset  . Diabetes Mother   . Stroke Mother   . Hypertension Brother   . Heart disease Brother   . Arthritis Sister        Rhematoid  . Arthritis Sister        Rhematoid   Social History   Tobacco Use  . Smoking status: Never Smoker  . Smokeless tobacco: Never Used  Substance Use Topics    . Alcohol use: No  . Drug use: No    ROS Constitutional: Denies fever, chills, weight loss/gain, headaches, insomnia,  night sweats, and change in appetite. Does c/o fatigue. Eyes: Denies redness, blurred vision, diplopia, discharge, itchy, watery eyes.  ENT: Denies discharge, congestion, post nasal drip, epistaxis, sore throat, earache, hearing loss, dental pain, Tinnitus, Vertigo, Sinus pain, snoring.  Cardio: Denies chest pain, palpitations, irregular heartbeat, syncope, dyspnea, diaphoresis, orthopnea, PND, claudication, edema Respiratory: denies cough, dyspnea, DOE, pleurisy, hoarseness, laryngitis, wheezing.  Gastrointestinal: Denies dysphagia, heartburn, reflux, water brash, pain, cramps, nausea, vomiting, bloating, diarrhea, constipation, hematemesis, melena, hematochezia, jaundice, hemorrhoids Genitourinary: Denies dysuria, frequency, urgency, nocturia, hesitancy, discharge, hematuria, flank pain Breast: Breast lumps, nipple discharge, bleeding.  Musculoskeletal: Denies arthralgia, myalgia, stiffness, Jt. Swelling, pain, limp, and strain/sprain. Denies falls. Skin: Denies puritis, rash, hives, warts, acne, eczema, changing in skin lesion Neuro: No weakness, tremor, incoordination, spasms, paresthesia, pain Psychiatric: Denies confusion, memory loss, sensory loss. Denies Depression. Endocrine: Denies change in weight, skin, hair change, nocturia, and paresthesia, diabetic polys, visual blurring, hyper / hypo glycemic episodes.  Heme/Lymph: No excessive bleeding, bruising, enlarged lymph nodes.  Physical Exam  BP 112/60   Pulse 72   Temp (!) 97.2 F (36.2 C)   Resp 16   Ht 5\' 2"  (1.575 m)   Wt 113 lb 9.6 oz (51.5 kg)   BMI 20.78 kg/m   General Appearance: Well nourished, well groomed and in no apparent distress.  Eyes: PERRLA, EOMs, conjunctiva no swelling or erythema, normal fundi and vessels. Sinuses: No frontal/maxillary tenderness ENT/Mouth: EACs patent / TMs  nl.  Nares clear without erythema, swelling, mucoid exudates. Oral hygiene is good. No erythema, swelling, or exudate. Tongue normal, non-obstructing. Tonsils not swollen or erythematous. Hearing normal.  Neck: Supple, thyroid not palpable. No bruits, nodes or JVD. Respiratory: Respiratory effort normal.  BS equal and clear bilateral without rales, rhonci, wheezing or stridor. Cardio: Heart sounds are normal with regular rate and rhythm and no murmurs, rubs or gallops. Peripheral pulses are normal and equal bilaterally without edema. No aortic or femoral bruits. Chest: symmetric with normal excursions and percussion. Breasts: Symmetric, pendulous without lumps, nipple discharge, retractions, or fibrocystic changes.  Abdomen: Flat, soft with bowel sounds active. Nontender, no guarding, rebound, hernias, masses, or organomegaly.  Lymphatics: Non tender without lymphadenopathy.  Musculoskeletal: Full ROM all peripheral extremities, joint stability, 5/5 strength, and normal gait. Skin: Warm and dry without rashes, lesions, cyanosis, clubbing or  ecchymosis.  Neuro: Cranial nerves intact, reflexes equal bilaterally. Normal muscle tone, no cerebellar symptoms. Sensation intact.  Pysch: Alert and oriented X 3, normal affect, Insight and Judgment appropriate.   Assessment and Plan  1. Annual Preventative Screening Examination  2. Essential hypertension  - EKG 12-Lead - Urinalysis, Routine w reflex microscopic - Microalbumin / creatinine urine ratio - CBC with Differential/Platelet - COMPLETE METABOLIC PANEL WITH GFR - Magnesium - TSH  3. Hyperlipidemia associated with type 2 diabetes mellitus (HCC)  - EKG 12-Lead - TSH - Lipid panel  4. Type 2 diabetes mellitus with stage 2 chronic kidney disease,  without long-term current use of insulin (HCC)  - EKG 12-Lead - Urinalysis, Routine w reflex microscopic - Microalbumin / creatinine urine ratio - HM DIABETES FOOT EXAM - LOW EXTREMITY NEUR EXAM  DOCUM - Hemoglobin A1c - Insulin, random  5. Vitamin D deficiency  - VITAMIN D 25 Hydroxy  6. Chronic atrial fibrillation (HCC)  - EKG 12-Lead - TSH  7. Thrombophilia (HCC)   8. History of CVA (cerebrovascular accident)  - EKG 12-Lead  9. Vascular dementia without behavioral disturbance (HCC)  10. Gastroesophageal reflux disease  - CBC with Differential/Platelet  11. Screening for colorectal cancer  - POC Hemoccult Bld/Stl  12. Screening for ischemic heart disease  - EKG 12-Lead  13. FHx: heart disease  - EKG 12-Lead  14. Atherosclerosis of aorta (HCC)  - EKG 12-Lead  15. Medication management  - Urinalysis, Routine w reflex microscopic - Microalbumin / creatinine urine ratio - CBC with Differential/Platelet - COMPLETE METABOLIC PANEL WITH GFR - Magnesium - TSH - Lipid panel - Hemoglobin A1c - Insulin, random - VITAMIN D 25 Hydroxy         Patient  was counseled in prudent diet to achieve/maintain BMI less than 25 for weight control, BP monitoring, regular exercise and medications. Discussed med's effects and SE's. Screening labs and tests as requested with regular follow-up as recommended. Over 40 minutes of exam, counseling, chart review and high complex critical decision making was performed.   Kirtland Bouchard, MD

## 2019-07-14 NOTE — Patient Instructions (Signed)

## 2019-07-15 ENCOUNTER — Ambulatory Visit (INDEPENDENT_AMBULATORY_CARE_PROVIDER_SITE_OTHER): Payer: Medicare Other | Admitting: Internal Medicine

## 2019-07-15 ENCOUNTER — Other Ambulatory Visit: Payer: Self-pay

## 2019-07-15 VITALS — BP 112/60 | HR 72 | Temp 97.2°F | Resp 16 | Ht 62.0 in | Wt 113.6 lb

## 2019-07-15 DIAGNOSIS — Z0000 Encounter for general adult medical examination without abnormal findings: Secondary | ICD-10-CM

## 2019-07-15 DIAGNOSIS — E1169 Type 2 diabetes mellitus with other specified complication: Secondary | ICD-10-CM | POA: Diagnosis not present

## 2019-07-15 DIAGNOSIS — I700 Atherosclerosis of aorta: Secondary | ICD-10-CM

## 2019-07-15 DIAGNOSIS — D6859 Other primary thrombophilia: Secondary | ICD-10-CM

## 2019-07-15 DIAGNOSIS — E1122 Type 2 diabetes mellitus with diabetic chronic kidney disease: Secondary | ICD-10-CM | POA: Diagnosis not present

## 2019-07-15 DIAGNOSIS — Z136 Encounter for screening for cardiovascular disorders: Secondary | ICD-10-CM

## 2019-07-15 DIAGNOSIS — F0150 Vascular dementia without behavioral disturbance: Secondary | ICD-10-CM

## 2019-07-15 DIAGNOSIS — Z1211 Encounter for screening for malignant neoplasm of colon: Secondary | ICD-10-CM

## 2019-07-15 DIAGNOSIS — I482 Chronic atrial fibrillation, unspecified: Secondary | ICD-10-CM

## 2019-07-15 DIAGNOSIS — Z8673 Personal history of transient ischemic attack (TIA), and cerebral infarction without residual deficits: Secondary | ICD-10-CM | POA: Diagnosis not present

## 2019-07-15 DIAGNOSIS — E785 Hyperlipidemia, unspecified: Secondary | ICD-10-CM | POA: Diagnosis not present

## 2019-07-15 DIAGNOSIS — Z8249 Family history of ischemic heart disease and other diseases of the circulatory system: Secondary | ICD-10-CM

## 2019-07-15 DIAGNOSIS — N182 Chronic kidney disease, stage 2 (mild): Secondary | ICD-10-CM

## 2019-07-15 DIAGNOSIS — Z79899 Other long term (current) drug therapy: Secondary | ICD-10-CM

## 2019-07-15 DIAGNOSIS — E559 Vitamin D deficiency, unspecified: Secondary | ICD-10-CM

## 2019-07-15 DIAGNOSIS — I10 Essential (primary) hypertension: Secondary | ICD-10-CM

## 2019-07-15 DIAGNOSIS — Z1212 Encounter for screening for malignant neoplasm of rectum: Secondary | ICD-10-CM

## 2019-07-15 DIAGNOSIS — Z0001 Encounter for general adult medical examination with abnormal findings: Secondary | ICD-10-CM

## 2019-07-15 DIAGNOSIS — K219 Gastro-esophageal reflux disease without esophagitis: Secondary | ICD-10-CM

## 2019-07-16 LAB — CBC WITH DIFFERENTIAL/PLATELET
Absolute Monocytes: 654 cells/uL (ref 200–950)
Basophils Absolute: 68 cells/uL (ref 0–200)
Basophils Relative: 0.9 %
Eosinophils Absolute: 464 cells/uL (ref 15–500)
Eosinophils Relative: 6.1 %
HCT: 40.9 % (ref 35.0–45.0)
Hemoglobin: 13.5 g/dL (ref 11.7–15.5)
Lymphs Abs: 2789 cells/uL (ref 850–3900)
MCH: 30.3 pg (ref 27.0–33.0)
MCHC: 33 g/dL (ref 32.0–36.0)
MCV: 91.7 fL (ref 80.0–100.0)
MPV: 10.1 fL (ref 7.5–12.5)
Monocytes Relative: 8.6 %
Neutro Abs: 3625 cells/uL (ref 1500–7800)
Neutrophils Relative %: 47.7 %
Platelets: 234 10*3/uL (ref 140–400)
RBC: 4.46 10*6/uL (ref 3.80–5.10)
RDW: 13 % (ref 11.0–15.0)
Total Lymphocyte: 36.7 %
WBC: 7.6 10*3/uL (ref 3.8–10.8)

## 2019-07-16 LAB — URINALYSIS, ROUTINE W REFLEX MICROSCOPIC
Bacteria, UA: NONE SEEN /HPF
Bilirubin Urine: NEGATIVE
Glucose, UA: NEGATIVE
Hgb urine dipstick: NEGATIVE
Ketones, ur: NEGATIVE
Nitrite: NEGATIVE
Protein, ur: NEGATIVE
Specific Gravity, Urine: 1.016 (ref 1.001–1.03)
pH: <=5 (ref 5.0–8.0)

## 2019-07-16 LAB — LIPID PANEL
Cholesterol: 187 mg/dL (ref <200)
HDL: 49 mg/dL — ABNORMAL LOW (ref >=50)
LDL Cholesterol (Calc): 112 mg/dL (calc) — ABNORMAL HIGH
Non-HDL Cholesterol (Calc): 138 mg/dL (calc) — ABNORMAL HIGH (ref <130)
Total CHOL/HDL Ratio: 3.8 (calc) (ref <5.0)
Triglycerides: 151 mg/dL — ABNORMAL HIGH (ref <150)

## 2019-07-16 LAB — MAGNESIUM: Magnesium: 2 mg/dL (ref 1.5–2.5)

## 2019-07-16 LAB — MICROALBUMIN / CREATININE URINE RATIO
Creatinine, Urine: 66 mg/dL (ref 20–275)
Microalb Creat Ratio: 14 mcg/mg creat (ref <30)
Microalb, Ur: 0.9 mg/dL

## 2019-07-16 LAB — COMPLETE METABOLIC PANEL WITH GFR
AG Ratio: 1.5 (calc) (ref 1.0–2.5)
ALT: 14 U/L (ref 6–29)
AST: 18 U/L (ref 10–35)
Albumin: 3.9 g/dL (ref 3.6–5.1)
Alkaline phosphatase (APISO): 107 U/L (ref 37–153)
BUN: 15 mg/dL (ref 7–25)
CO2: 30 mmol/L (ref 20–32)
Calcium: 9.8 mg/dL (ref 8.6–10.4)
Chloride: 104 mmol/L (ref 98–110)
Creat: 0.8 mg/dL (ref 0.60–0.88)
GFR, Est African American: 74 mL/min/{1.73_m2} (ref >=60)
GFR, Est Non African American: 64 mL/min/{1.73_m2} (ref >=60)
Globulin: 2.6 g/dL (calc) (ref 1.9–3.7)
Glucose, Bld: 94 mg/dL (ref 65–99)
Potassium: 4.2 mmol/L (ref 3.5–5.3)
Sodium: 141 mmol/L (ref 135–146)
Total Bilirubin: 0.5 mg/dL (ref 0.2–1.2)
Total Protein: 6.5 g/dL (ref 6.1–8.1)

## 2019-07-16 LAB — VITAMIN D 25 HYDROXY (VIT D DEFICIENCY, FRACTURES): Vit D, 25-Hydroxy: 49 ng/mL (ref 30–100)

## 2019-07-16 LAB — HEMOGLOBIN A1C
Hgb A1c MFr Bld: 6.2 % of total Hgb — ABNORMAL HIGH (ref <5.7)
Mean Plasma Glucose: 131 (calc)
eAG (mmol/L): 7.3 (calc)

## 2019-07-16 LAB — INSULIN, RANDOM: Insulin: 3.3 u[IU]/mL

## 2019-07-16 LAB — TSH: TSH: 1.04 mIU/L (ref 0.40–4.50)

## 2019-07-18 ENCOUNTER — Emergency Department (HOSPITAL_COMMUNITY): Payer: Medicare Other

## 2019-07-18 ENCOUNTER — Emergency Department (HOSPITAL_COMMUNITY)
Admission: EM | Admit: 2019-07-18 | Discharge: 2019-07-19 | Disposition: A | Discharge status: Home / Self Care | Payer: Medicare Other | Attending: Emergency Medicine | Admitting: Emergency Medicine

## 2019-07-18 ENCOUNTER — Encounter (HOSPITAL_COMMUNITY): Payer: Self-pay

## 2019-07-18 ENCOUNTER — Other Ambulatory Visit: Payer: Self-pay

## 2019-07-18 DIAGNOSIS — K449 Diaphragmatic hernia without obstruction or gangrene: Secondary | ICD-10-CM | POA: Diagnosis not present

## 2019-07-18 DIAGNOSIS — Z20822 Contact with and (suspected) exposure to covid-19: Secondary | ICD-10-CM | POA: Insufficient documentation

## 2019-07-18 DIAGNOSIS — F0150 Vascular dementia without behavioral disturbance: Secondary | ICD-10-CM | POA: Insufficient documentation

## 2019-07-18 DIAGNOSIS — Z8673 Personal history of transient ischemic attack (TIA), and cerebral infarction without residual deficits: Secondary | ICD-10-CM | POA: Diagnosis not present

## 2019-07-18 DIAGNOSIS — M542 Cervicalgia: Secondary | ICD-10-CM | POA: Diagnosis not present

## 2019-07-18 DIAGNOSIS — E1122 Type 2 diabetes mellitus with diabetic chronic kidney disease: Secondary | ICD-10-CM | POA: Insufficient documentation

## 2019-07-18 DIAGNOSIS — R0902 Hypoxemia: Secondary | ICD-10-CM | POA: Diagnosis not present

## 2019-07-18 DIAGNOSIS — R10819 Abdominal tenderness, unspecified site: Secondary | ICD-10-CM | POA: Insufficient documentation

## 2019-07-18 DIAGNOSIS — R55 Syncope and collapse: Secondary | ICD-10-CM

## 2019-07-18 DIAGNOSIS — N182 Chronic kidney disease, stage 2 (mild): Secondary | ICD-10-CM | POA: Insufficient documentation

## 2019-07-18 DIAGNOSIS — Z9049 Acquired absence of other specified parts of digestive tract: Secondary | ICD-10-CM | POA: Insufficient documentation

## 2019-07-18 DIAGNOSIS — R197 Diarrhea, unspecified: Secondary | ICD-10-CM

## 2019-07-18 DIAGNOSIS — Z79899 Other long term (current) drug therapy: Secondary | ICD-10-CM | POA: Insufficient documentation

## 2019-07-18 DIAGNOSIS — R4182 Altered mental status, unspecified: Secondary | ICD-10-CM | POA: Diagnosis not present

## 2019-07-18 DIAGNOSIS — Z7901 Long term (current) use of anticoagulants: Secondary | ICD-10-CM | POA: Insufficient documentation

## 2019-07-18 DIAGNOSIS — I5030 Unspecified diastolic (congestive) heart failure: Secondary | ICD-10-CM | POA: Diagnosis not present

## 2019-07-18 DIAGNOSIS — I2510 Atherosclerotic heart disease of native coronary artery without angina pectoris: Secondary | ICD-10-CM | POA: Diagnosis not present

## 2019-07-18 DIAGNOSIS — R531 Weakness: Secondary | ICD-10-CM | POA: Diagnosis not present

## 2019-07-18 DIAGNOSIS — I130 Hypertensive heart and chronic kidney disease with heart failure and stage 1 through stage 4 chronic kidney disease, or unspecified chronic kidney disease: Secondary | ICD-10-CM | POA: Diagnosis not present

## 2019-07-18 DIAGNOSIS — K5730 Diverticulosis of large intestine without perforation or abscess without bleeding: Secondary | ICD-10-CM | POA: Diagnosis not present

## 2019-07-18 LAB — COMPREHENSIVE METABOLIC PANEL
ALT: 19 U/L (ref 0–44)
AST: 25 U/L (ref 15–41)
Albumin: 3.7 g/dL (ref 3.5–5.0)
Alkaline Phosphatase: 118 U/L (ref 38–126)
Anion gap: 12 (ref 5–15)
BUN: 15 mg/dL (ref 8–23)
CO2: 26 mmol/L (ref 22–32)
Calcium: 9.5 mg/dL (ref 8.9–10.3)
Chloride: 101 mmol/L (ref 98–111)
Creatinine, Ser: 0.93 mg/dL (ref 0.44–1.00)
GFR calc Af Amer: >60 mL/min (ref >60)
GFR calc non Af Amer: 53 mL/min — ABNORMAL LOW (ref >60)
Glucose, Bld: 157 mg/dL — ABNORMAL HIGH (ref 70–99)
Potassium: 4.2 mmol/L (ref 3.5–5.1)
Sodium: 139 mmol/L (ref 135–145)
Total Bilirubin: 0.8 mg/dL (ref 0.3–1.2)
Total Protein: 7.2 g/dL (ref 6.5–8.1)

## 2019-07-18 LAB — CBC WITH DIFFERENTIAL/PLATELET
Abs Immature Granulocytes: 0.06 10*3/uL (ref 0.00–0.07)
Basophils Absolute: 0.1 10*3/uL (ref 0.0–0.1)
Basophils Relative: 1 %
Eosinophils Absolute: 0.2 10*3/uL (ref 0.0–0.5)
Eosinophils Relative: 1 %
HCT: 44.9 % (ref 36.0–46.0)
Hemoglobin: 14.3 g/dL (ref 12.0–15.0)
Immature Granulocytes: 1 %
Lymphocytes Relative: 18 %
Lymphs Abs: 1.9 10*3/uL (ref 0.7–4.0)
MCH: 29.9 pg (ref 26.0–34.0)
MCHC: 31.8 g/dL (ref 30.0–36.0)
MCV: 93.7 fL (ref 80.0–100.0)
Monocytes Absolute: 0.9 10*3/uL (ref 0.1–1.0)
Monocytes Relative: 9 %
Neutro Abs: 7.4 10*3/uL (ref 1.7–7.7)
Neutrophils Relative %: 70 %
Platelets: 250 10*3/uL (ref 150–400)
RBC: 4.79 MIL/uL (ref 3.87–5.11)
RDW: 13.6 % (ref 11.5–15.5)
WBC: 10.5 10*3/uL (ref 4.0–10.5)
nRBC: 0 % (ref 0.0–0.2)

## 2019-07-18 LAB — POCT I-STAT EG7
Acid-Base Excess: 4 mmol/L — ABNORMAL HIGH (ref 0.0–2.0)
Bicarbonate: 28.9 mmol/L — ABNORMAL HIGH (ref 20.0–28.0)
Calcium, Ion: 1.09 mmol/L — ABNORMAL LOW (ref 1.15–1.40)
HCT: 43 % (ref 36.0–46.0)
Hemoglobin: 14.6 g/dL (ref 12.0–15.0)
O2 Saturation: 92 %
Potassium: 5 mmol/L (ref 3.5–5.1)
Sodium: 135 mmol/L (ref 135–145)
TCO2: 30 mmol/L (ref 22–32)
pCO2, Ven: 43.3 mmHg — ABNORMAL LOW (ref 44.0–60.0)
pH, Ven: 7.432 — ABNORMAL HIGH (ref 7.250–7.430)
pO2, Ven: 61 mmHg — ABNORMAL HIGH (ref 32.0–45.0)

## 2019-07-18 LAB — AMMONIA: Ammonia: 37 umol/L — ABNORMAL HIGH (ref 9–35)

## 2019-07-18 LAB — PROTIME-INR
INR: 2.6 — ABNORMAL HIGH (ref 0.8–1.2)
Prothrombin Time: 27.5 seconds — ABNORMAL HIGH (ref 11.4–15.2)

## 2019-07-18 LAB — LACTIC ACID, PLASMA
Lactic Acid, Venous: 2.5 mmol/L (ref 0.5–1.9)
Lactic Acid, Venous: 1.9 mmol/L (ref 0.5–1.9)

## 2019-07-18 LAB — TSH: TSH: 0.564 u[IU]/mL (ref 0.350–4.500)

## 2019-07-18 LAB — POC SARS CORONAVIRUS 2 AG -  ED: SARS Coronavirus 2 Ag: NEGATIVE

## 2019-07-18 LAB — TROPONIN I (HIGH SENSITIVITY): Troponin I (High Sensitivity): 7 ng/L (ref <18)

## 2019-07-18 MED ORDER — IOHEXOL 300 MG/ML  SOLN
100.0000 mL | Freq: Once | INTRAMUSCULAR | Status: AC | PRN
  Administered 2019-07-18: 100 mL via INTRAVENOUS

## 2019-07-18 MED ORDER — LACTATED RINGERS IV BOLUS
1000.0000 mL | Freq: Once | INTRAVENOUS | Status: AC
  Administered 2019-07-18: 1000 mL via INTRAVENOUS

## 2019-07-18 MED ORDER — LACTATED RINGERS IV BOLUS: 1000.0000 mL | Freq: Once | INTRAVENOUS | Status: DC

## 2019-07-18 NOTE — ED Provider Notes (Signed)
[facility] DEPARTMENT Provider Note   CSN: [id number] Arrival date & time: 07/18/19  1711     History Chief Complaint  Patient presents with  . Altered Mental Status  . Diarrhea    Lisa Hopkins is a 86 y.o. female.  The history is provided by the patient, medical records and a relative.  Altered Mental Status Presenting symptoms: confusion and disorientation   Severity:  Moderate Most recent episode:  Today Episode history:  Single Timing:  Constant Progression:  Improving Chronicity:  New Context: dementia and nursing home resident   Context: taking medications as prescribed   Context comment:  Just received second covid vaccine Associated symptoms: no abdominal pain, no difficulty breathing, no fever, no headaches, no nausea and no vomiting   Associated symptoms comment:  Diarrhea Diarrhea Quality:  Unable to specify Severity:  Moderate Onset quality:  Gradual Duration: Today. Timing:  Constant Progression:  Unchanged Associated symptoms: no abdominal pain, no fever, no headaches and no vomiting   There is also a report of a syncopal episode earlier today while the pt was getting her hair done at her facility.  No fall, no head injury.       Past Medical History:  Diagnosis Date  . Anxiety   . Aphasia as late effect of cerebrovascular accident   . Atrial fibrillation ([place])   . CKD (chronic kidney disease), stage II   . CVA (cerebral infarction)   . Diabetes mellitus type II   . DVT, lower extremity, distal, acute, right ([place]) 03/25/2018   Right peroneal vein 03/22/2018  . GERD (gastroesophageal reflux disease)   . Hiatal hernia   . HTN (hypertension)   . Hyperlipidemia   . IBS (irritable bowel syndrome)   . Stroke ([place])   . Vitamin D deficiency     Patient Active Problem List   Diagnosis Date Noted  . Vascular dementia without behavioral disturbance ([place]) 12/31/2018  . Protein-calorie malnutrition, severe 06/20/2018   . Encounter for general adult medical examination with abnormal findings 12/21/2017  . Hiatal hernia 12/19/2017  . CKD stage 2 due to type 2 diabetes mellitus ([place]) 08/02/2017  . Diet-controlled type 2 diabetes mellitus ([place]) 12/25/2016  . Atherosclerosis of aorta ([place]) 12/03/2015  . GERD 09/25/2014  . Diastolic CHF ([place]) 34/19/6222  . Hyperlipidemia associated with type 2 diabetes mellitus ([place])   . Vitamin D deficiency   . Anxiety state 03/15/2010  . Essential hypertension 03/15/2010  . Atrial fibrillation ([place]) 03/15/2010  . APHASIA DUE TO CEREBROVASCULAR DISEASE 03/15/2010  . History of CVA (cerebrovascular accident) 03/07/2010    Past Surgical History:  Procedure Laterality Date  . CATARACT EXTRACTION, BILATERAL    . CHOLECYSTECTOMY    . COLONOSCOPY  2005  . ESOPHAGOGASTRODUODENOSCOPY (EGD) WITH PROPOFOL N/A 11/28/2017   Procedure: ESOPHAGOGASTRODUODENOSCOPY (EGD) WITH PROPOFOL;  Surgeon: Wonda Horner, MD;  Location: [facility] ENDOSCOPY;  Service: Endoscopy;  Laterality: N/A;  . TONSILLECTOMY    . UPPER GASTROINTESTINAL ENDOSCOPY  2005     OB History   No obstetric history on file.     Family History  Problem Relation Age of Onset  . Diabetes Mother   . Stroke Mother   . Hypertension Brother   . Heart disease Brother   . Arthritis Sister        Rhematoid  . Arthritis Sister        Rhematoid    Social History   Tobacco Use  . Smoking status: Never  Smoker  . Smokeless tobacco: Never Used  Substance Use Topics  . Alcohol use: No  . Drug use: No    Home Medications Prior to Admission medications   Medication Sig Start Date End Date Taking? Authorizing Provider  CINNAMON PO Take 1,000 mg by mouth daily after breakfast.    Yes [provider]  furosemide (LASIX) 20 MG tablet Take 1 tablet Daily  for BP & Fluid Retention / Ankle Swelling. Patient taking differently: Take 20 mg by mouth daily. for BP & Fluid Retention / Ankle Swelling. 04/14/19  Yes  Lucky Cowboy, MD  loperamide (IMODIUM) 2 MG capsule TAKE 1 TABLET AT NIGHT. Patient taking differently: Take 2 mg by mouth daily with lunch.  02/14/19  Yes Lucky Cowboy, MD  losartan (COZAAR) 50 MG tablet Take 1 tablet Daily for BP Patient taking differently: Take 50 mg by mouth daily. for BP 07/04/19  Yes Lucky Cowboy, MD  metoprolol tartrate (LOPRESSOR) 25 MG tablet Take 1/2 tablet 2 x /day for BP Patient taking differently: Take 12.5 mg by mouth 2 (two) times daily after a meal. for BP 08/10/18  Yes Lucky Cowboy, MD  pantoprazole (PROTONIX) 40 MG tablet Take 1 tablet Daily for Heart burn & Indigestion Patient taking differently: Take 40 mg by mouth daily after breakfast. for Heart burn & Indigestion 02/14/19  Yes Lucky Cowboy, MD  potassium chloride (KLOR-CON) 10 MEQ tablet Take 1 tablet daily for Potassium Patient taking differently: Take 10 mEq by mouth daily.  07/08/19  Yes Lucky Cowboy, MD  QUEtiapine (SEROQUEL) 25 MG tablet Take 1/2 tablet at Bedtime Patient taking differently: Take 12.5 mg by mouth at bedtime.  10/09/18  Yes Lucky Cowboy, MD  warfarin (COUMADIN) 2 MG tablet TAKE 1 TABLET EACH DAY OR AS DIRECTED BY THE COUMADIN CLINIC Patient taking differently: Take 2 mg by mouth daily. Or as directed by coumadin clinic 07/08/19  Yes Lars Masson, MD  Cholecalciferol (VITAMIN D3) 125 MCG (5000 UT) TABS Take 5,000-10,000 Units by mouth See admin instructions. Take two tablets (10,000 units) by mouth on Monday, Wednesday, Friday after supper, take one tablet (5,000 units) on Sunday, Tuesday, Thursday, Saturday after supper    [provider]  dicyclomine (BENTYL) 20 MG tablet Take 1 tablet 3 x day if needed for nausea, bloating, cramping or diarrhea Patient not taking: Reported on 07/18/2019 05/16/18   Lucky Cowboy, MD  feeding supplement, ENSURE ENLIVE, (ENSURE ENLIVE) LIQD Take 237 mLs by mouth 2 (two) times daily between meals. Patient not taking:  Reported on 07/18/2019 06/20/18   Calvert Cantor, MD  ondansetron (ZOFRAN ODT) 4 MG disintegrating tablet Dissolve 1 tablet under tongue every 6 to 8 hours if needed for nausea & / or vomitting Patient not taking: Reported on 07/18/2019 04/10/18   Lucky Cowboy, MD  warfarin (COUMADIN) 3 MG tablet TAKE 1 TABLET (3 MG) ON MONDAYS ONLY Patient not taking: Reported on 07/18/2019 04/15/19   Lars Masson, MD    Allergies    Ace inhibitors, Fosamax [alendronate sodium], Norvasc [amlodipine besylate], and Zocor [simvastatin]  Review of Systems   Review of Systems  Constitutional: Negative for fever.  Respiratory: Negative for cough and shortness of breath.   Cardiovascular: Negative for chest pain.  Gastrointestinal: Positive for diarrhea. Negative for abdominal pain, nausea and vomiting.  Neurological: Negative for headaches.  Psychiatric/Behavioral: Positive for confusion.  All other systems reviewed and are negative.   Physical Exam Updated Vital Signs BP (!) 160/75   Pulse  75   Resp 16   SpO2 96%   Physical Exam Vitals and nursing note reviewed.  Constitutional:      Appearance: She is well-developed. She is not toxic-appearing or diaphoretic.  HENT:     Head: Normocephalic and atraumatic.     Mouth/Throat:     Mouth: Mucous membranes are dry.  Eyes:     Conjunctiva/sclera: Conjunctivae normal.     Pupils: Pupils are equal, round, and reactive to light.  Cardiovascular:     Rate and Rhythm: Normal rate and regular rhythm.     Pulses: Normal pulses.     Heart sounds: No murmur.  Pulmonary:     Effort: Pulmonary effort is normal. No respiratory distress.     Breath sounds: Normal breath sounds.  Abdominal:     Palpations: Abdomen is soft.     Tenderness: There is abdominal tenderness (mild diffuse). There is no guarding or rebound.  Musculoskeletal:     Cervical back: Neck supple. No rigidity.     Right lower leg: No edema.     Left lower leg: No edema.  Skin:     General: Skin is warm and dry.  Neurological:     Mental Status: She is alert. Mental status is at baseline. She is disoriented.     Comments: Oriented x2  Psychiatric:        Mood and Affect: Mood normal.        Behavior: Behavior normal.      ED Results / Procedures / Treatments   Labs (all labs ordered are listed, but only abnormal results are displayed) Labs Reviewed  LACTIC ACID, PLASMA - Abnormal; Notable for the following components:      Result Value   Lactic Acid, Venous 2.5 (*)    All other components within normal limits  COMPREHENSIVE METABOLIC PANEL - Abnormal; Notable for the following components:   Glucose, Bld 157 (*)    GFR calc non Af Amer 53 (*)    All other components within normal limits  AMMONIA - Abnormal; Notable for the following components:   Ammonia 37 (*)    All other components within normal limits  PROTIME-INR - Abnormal; Notable for the following components:   Prothrombin Time 27.5 (*)    INR 2.6 (*)    All other components within normal limits  POCT I-STAT EG7 - Abnormal; Notable for the following components:   pH, Ven 7.432 (*)    pCO2, Ven 43.3 (*)    pO2, Ven 61.0 (*)    Bicarbonate 28.9 (*)    Acid-Base Excess 4.0 (*)    Calcium, Ion 1.09 (*)    All other components within normal limits  CBC WITH DIFFERENTIAL/PLATELET  LACTIC ACID, PLASMA  TSH  URINALYSIS, ROUTINE W REFLEX MICROSCOPIC  I-STAT VENOUS BLOOD GAS, ED  POC SARS CORONAVIRUS 2 AG -  ED  TROPONIN I (HIGH SENSITIVITY)   EKG EKG Interpretation  Date/Time:  Friday July 18 2019 17:21:38 EST Ventricular Rate:  69 PR Interval:    QRS Duration: 80 QT Interval:  410 QTC Calculation: 439 R Axis:   95 Text Interpretation: Atrial fibrillation Rightward axis Abnormal ECG No significant change was found Confirmed by Glynn Octave [phone]) on 07/18/2019 9:30:20 PM  Radiology CT Head Wo Contrast  Result Date: 07/18/2019 CLINICAL DATA:  86 year old female with history of  altered mental status. EXAM: CT HEAD WITHOUT CONTRAST TECHNIQUE: Contiguous axial images were obtained from the base of the skull through the vertex  without intravenous contrast. COMPARISON:  Head CT 03/21/2018. FINDINGS: Brain: Patchy and confluent areas of decreased attenuation are noted throughout the deep and periventricular white matter of the cerebral hemispheres bilaterally, compatible with chronic microvascular ischemic disease. Additional more focal areas of low attenuation are noted in the left frontal lobe and right cerebellar hemisphere, compatible with areas of encephalomalacia/gliosis from prior infarctions. Physiologic calcifications in the basal ganglia bilaterally. No evidence of acute infarction, hemorrhage, hydrocephalus, extra-axial collection or mass lesion/mass effect. Vascular: No hyperdense vessel or unexpected calcification. Skull: Normal. Negative for fracture or focal lesion. Sinuses/Orbits: No acute finding. Other: None. IMPRESSION: 1. No acute intracranial abnormalities. 2. Extensive chronic microvascular ischemic changes as well as old infarcts in the left frontal lobe and right cerebellar hemisphere, similar to prior study, as above. Electronically Signed   By: Trudie Reed M.D.   On: 07/18/2019 21:03   CT ABDOMEN PELVIS W CONTRAST  Result Date: 07/18/2019 CLINICAL DATA:  86 year old female with history of diarrhea. EXAM: CT ABDOMEN AND PELVIS WITH CONTRAST TECHNIQUE: Multidetector CT imaging of the abdomen and pelvis was performed using the standard protocol following bolus administration of intravenous contrast. CONTRAST:  OMNIPAQUE IOHEXOL 300 MG/ML  SOLN COMPARISON:  CT the abdomen and pelvis 02/28/2019. FINDINGS: Lower chest: Large hiatal hernia. Atherosclerotic calcifications in the descending thoracic aorta as well as the right coronary arteries. Calcifications of the mitral annulus. Hepatobiliary: No suspicious cystic or solid hepatic lesions. No intra or  extrahepatic biliary ductal dilatation. Status post cholecystectomy. Pancreas: No pancreatic mass. No pancreatic ductal dilatation. No pancreatic or peripancreatic fluid collections or inflammatory changes. Spleen: Unremarkable. Adrenals/Urinary Tract: Focal areas of cortical scarring are noted in the left kidney. Right kidney and bilateral adrenal glands are normal in appearance. No hydroureteronephrosis. Urinary bladder is normal in appearance. Stomach/Bowel: Normal appearance of the stomach. No pathologic dilatation of small bowel or colon. A few scattered colonic diverticulae are noted, without surrounding inflammatory changes to suggest an acute diverticulitis at this time. The appendix is not confidently identified and may be surgically absent. Regardless, there are no inflammatory changes noted adjacent to the cecum to suggest the presence of an acute appendicitis at this time. Vascular/Lymphatic: Aortic atherosclerosis, without evidence of aneurysm or dissection in the abdominal or pelvic vasculature. No lymphadenopathy noted in the abdomen or pelvis. Reproductive: Densely calcified lesions in the uterus measuring up to 1.5 cm in diameter, most compatible with small calcified fibroids. Ovaries are a trophic. Other: No significant volume of ascites.  No pneumoperitoneum. Musculoskeletal: There are no aggressive appearing lytic or blastic lesions noted in the visualized portions of the skeleton. IMPRESSION: 1. No acute findings are noted in the abdomen or pelvis to account for the patient's symptoms. 2. Colonic diverticulosis without evidence of acute diverticulitis at this time. 3. Large hiatal hernia with predominantly intrathoracic stomach. 4. Aortic atherosclerosis, in addition to least right coronary artery disease. 5. There are calcifications of the mitral annulus. Echocardiographic correlation for evaluation of potential valvular dysfunction may be warranted if clinically indicated. 6. Additional  incidental findings, as above. Electronically Signed   By: Trudie Reed M.D.   On: 07/18/2019 21:26   DG Chest Portable 1 View  Result Date: 07/18/2019 CLINICAL DATA:  Altered mental status.  Diarrhea. EXAM: PORTABLE CHEST 1 VIEW COMPARISON:  Single-view of the chest 02/28/2019 and PA and lateral chest 03/21/2018. FINDINGS: Large hiatal hernia central and to the left is again seen. Lungs clear. Heart size enlarged. No pneumothorax or pleural effusion. No  acute or focal bony abnormality. IMPRESSION: No acute disease. No change in a large hiatal hernia. Electronically Signed   By: Drusilla Kanner M.D.   On: 07/18/2019 20:55   Procedures Procedures (including critical care time)  Medications Ordered in ED Medications  lactated ringers bolus 1,000 mL (1,000 mLs Intravenous New Bag/Given 07/18/19 2134)  iohexol (OMNIPAQUE) 300 MG/ML solution 100 mL (100 mLs Intravenous Contrast Given 07/18/19 2056)    ED Course  I have reviewed the triage vital signs and the nursing notes.  Pertinent labs & imaging results that were available during my care of the patient were reviewed by me and considered in my medical decision making (see chart for details).    MDM Rules/Calculators/A&P                      Here for reported altered mental status, diarrhea, syncopal episode from facility. 2nd COVID vaccine 2 days ago.    CT head negative for acute bleed.  CT C-spine ordered for neck pain, no acute fracture.    CT abd pelvis ordered due to diarrhea, abd TTP in the setting of advanced age and no emergent findings noted.    Initial lactic acid mildly elevated, likely secondary to dehydration from diarrhea, cleared with IVF bolus.    Initial troponin negative, will repeat second troponin, EKG without evidence of STEMI or emergent arrhythmia, no chest pain or SOB, do not suspect ACS at this time.    Pt well appearing on exam and back to her mental status baseline per son.  Pt had reassuring ECHO in 2019,  history sounds consistent with vaso-vagal syncope.  Plan to repeat troponin, ambulate the pt, and if able to tolerate will discharge back to facility.     Final Clinical Impression(s) / ED Diagnoses Final diagnoses:  Diarrhea, unspecified type  Altered mental status, unspecified altered mental status type  Syncope, unspecified syncope type    Rx / DC Orders ED Discharge Orders    None       Tanner Vigna, [place], MD 07/19/19 1456    Glynn Octave, MD 07/19/19 2013

## 2019-07-18 NOTE — ED Triage Notes (Signed)
Pt from [facility] with ems for AMS and diarrhea since this morning. Staff reports pt is normally alert and oriented x4 but has been more lethargic today and has been having diarrhea. Pt arrives to ED alert, oriented to self. VSS. Pt received second dose of COVID vaccine 2 days ago.

## 2019-07-18 NOTE — ED Notes (Signed)
EDP made aware of elevated Lactic Acid. Awaiting further orders.

## 2019-07-18 NOTE — ED Notes (Signed)
Pt transported to CT ?

## 2019-07-19 LAB — URINALYSIS, ROUTINE W REFLEX MICROSCOPIC
Bilirubin Urine: NEGATIVE
Glucose, UA: NEGATIVE mg/dL
Hgb urine dipstick: NEGATIVE
Ketones, ur: NEGATIVE mg/dL
Leukocytes,Ua: NEGATIVE
Nitrite: NEGATIVE
Protein, ur: NEGATIVE mg/dL
Specific Gravity, Urine: >1.046 — ABNORMAL HIGH (ref 1.005–1.030)
pH: 7 (ref 5.0–8.0)

## 2019-07-19 LAB — SARS CORONAVIRUS 2 (TAT 6-24 HRS): SARS Coronavirus 2: NEGATIVE

## 2019-07-19 LAB — TROPONIN I (HIGH SENSITIVITY): Troponin I (High Sensitivity): 9 ng/L (ref <18)

## 2019-07-19 NOTE — ED Provider Notes (Signed)
5:52 AM Assumed care from Drs. Rancour and Hunter, please see their note for full history, physical and decision making until this point. In brief this is a 86 y.o. year old female who presented to the ED tonight with Altered Mental Status and Diarrhea     Apparently had multiple symptoms just prior to arrival. Then all resolved. Pending second troponing, but otherwise ready to be discharged in stable condition.   Troponin normal. Workup normal. At baseline. Discharge.   Discharge instructions, including strict return precautions for new or worsening symptoms, given. Patient and/or family verbalized understanding and agreement with the plan as described.   Labs, studies and imaging reviewed by myself and considered in medical decision making if ordered. Imaging interpreted by radiology.  Labs Reviewed  LACTIC ACID, PLASMA - Abnormal; Notable for the following components:      Result Value   Lactic Acid, Venous 2.5 (*)    All other components within normal limits  COMPREHENSIVE METABOLIC PANEL - Abnormal; Notable for the following components:   Glucose, Bld 157 (*)    GFR calc non Af Amer 53 (*)    All other components within normal limits  AMMONIA - Abnormal; Notable for the following components:   Ammonia 37 (*)    All other components within normal limits  URINALYSIS, ROUTINE W REFLEX MICROSCOPIC - Abnormal; Notable for the following components:   Color, Urine STRAW (*)    Specific Gravity, Urine >1.046 (*)    All other components within normal limits  PROTIME-INR - Abnormal; Notable for the following components:   Prothrombin Time 27.5 (*)    INR 2.6 (*)    All other components within normal limits  POCT I-STAT EG7 - Abnormal; Notable for the following components:   pH, Ven 7.432 (*)    pCO2, Ven 43.3 (*)    pO2, Ven 61.0 (*)    Bicarbonate 28.9 (*)    Acid-Base Excess 4.0 (*)    Calcium, Ion 1.09 (*)    All other components within normal limits  SARS CORONAVIRUS 2 (TAT 6-24  HRS)  LACTIC ACID, PLASMA  CBC WITH DIFFERENTIAL/PLATELET  TSH  I-STAT VENOUS BLOOD GAS, ED  POC SARS CORONAVIRUS 2 AG -  ED  TROPONIN I (HIGH SENSITIVITY)  TROPONIN I (HIGH SENSITIVITY)    CT Cervical Spine Wo Contrast  Final Result    CT ABDOMEN PELVIS W CONTRAST  Final Result    CT Head Wo Contrast  Final Result    DG Chest Portable 1 View  Final Result      No follow-ups on file.    Lisa Hopkins, Barbara Cower, MD 07/19/19 [phone]

## 2019-07-19 NOTE — ED Notes (Signed)
Patient verbalizes understanding of discharge instructions. Opportunity for questioning and answers were provided. Armband removed by staff, pt discharged from ED in wheelchair to [facility].

## 2019-08-04 ENCOUNTER — Other Ambulatory Visit: Payer: Self-pay

## 2019-08-04 DIAGNOSIS — Z1211 Encounter for screening for malignant neoplasm of colon: Secondary | ICD-10-CM

## 2019-08-04 DIAGNOSIS — Z1212 Encounter for screening for malignant neoplasm of rectum: Secondary | ICD-10-CM

## 2019-08-04 LAB — POC HEMOCCULT BLD/STL (HOME/3-CARD/SCREEN)
Card #2 Fecal Occult Blod, POC: NEGATIVE
Card #3 Fecal Occult Blood, POC: NEGATIVE
Fecal Occult Blood, POC: NEGATIVE

## 2019-08-05 DIAGNOSIS — Z1211 Encounter for screening for malignant neoplasm of colon: Secondary | ICD-10-CM | POA: Diagnosis not present

## 2019-08-05 DIAGNOSIS — Z1212 Encounter for screening for malignant neoplasm of rectum: Secondary | ICD-10-CM | POA: Diagnosis not present

## 2019-08-06 ENCOUNTER — Other Ambulatory Visit: Payer: Self-pay | Admitting: Internal Medicine

## 2019-08-06 ENCOUNTER — Other Ambulatory Visit: Payer: Self-pay | Admitting: Cardiology

## 2019-08-06 DIAGNOSIS — I4891 Unspecified atrial fibrillation: Secondary | ICD-10-CM

## 2019-08-06 DIAGNOSIS — Z79899 Other long term (current) drug therapy: Secondary | ICD-10-CM

## 2019-08-11 DIAGNOSIS — Z20828 Contact with and (suspected) exposure to other viral communicable diseases: Secondary | ICD-10-CM | POA: Diagnosis not present

## 2019-08-11 DIAGNOSIS — Z1159 Encounter for screening for other viral diseases: Secondary | ICD-10-CM | POA: Diagnosis not present

## 2019-08-13 ENCOUNTER — Ambulatory Visit (INDEPENDENT_AMBULATORY_CARE_PROVIDER_SITE_OTHER): Payer: Medicare Other | Admitting: *Deleted

## 2019-08-13 ENCOUNTER — Other Ambulatory Visit: Payer: Self-pay

## 2019-08-13 DIAGNOSIS — I4891 Unspecified atrial fibrillation: Secondary | ICD-10-CM | POA: Diagnosis not present

## 2019-08-13 DIAGNOSIS — Z5181 Encounter for therapeutic drug level monitoring: Secondary | ICD-10-CM

## 2019-08-13 DIAGNOSIS — Z79899 Other long term (current) drug therapy: Secondary | ICD-10-CM

## 2019-08-13 LAB — POCT INR: INR: 3.9 — AB (ref 2.0–3.0)

## 2019-08-13 NOTE — Patient Instructions (Addendum)
Description   Hold tomorrow's dose then continue taking 2mg  daily. Recheck INR in 4 weeks.  Call at [facility] # [phone], Main # [phone].

## 2019-08-18 DIAGNOSIS — Z1159 Encounter for screening for other viral diseases: Secondary | ICD-10-CM | POA: Diagnosis not present

## 2019-08-18 DIAGNOSIS — Z20828 Contact with and (suspected) exposure to other viral communicable diseases: Secondary | ICD-10-CM | POA: Diagnosis not present

## 2019-08-25 DIAGNOSIS — Z1159 Encounter for screening for other viral diseases: Secondary | ICD-10-CM | POA: Diagnosis not present

## 2019-08-25 DIAGNOSIS — Z20828 Contact with and (suspected) exposure to other viral communicable diseases: Secondary | ICD-10-CM | POA: Diagnosis not present

## 2019-08-26 ENCOUNTER — Ambulatory Visit: Payer: Medicare Other | Admitting: Adult Health Nurse Practitioner

## 2019-08-26 ENCOUNTER — Other Ambulatory Visit: Payer: Self-pay

## 2019-08-26 ENCOUNTER — Encounter: Payer: Self-pay | Admitting: Adult Health Nurse Practitioner

## 2019-08-26 VITALS — BP 148/80 | HR 68 | Temp 97.2°F | Wt 114.0 lb

## 2019-08-26 DIAGNOSIS — B029 Zoster without complications: Secondary | ICD-10-CM | POA: Diagnosis not present

## 2019-08-26 DIAGNOSIS — R21 Rash and other nonspecific skin eruption: Secondary | ICD-10-CM

## 2019-08-26 MED ORDER — ACYCLOVIR 400 MG PO TABS: ORAL_TABLET | ORAL | 0 refills | Status: DC

## 2019-08-26 NOTE — Progress Notes (Signed)
Assessment and Plan:  Lisa Hopkins was seen today for rash.  Diagnoses and all orders for this visit:  Rash Herpes zoster without complication -     acyclovir (ZOVIRAX) 400 MG tablet; Take 1 pill 3 times daily with food and 2 pills at night for 7 days. Discussed medication and side effects with patient and permission to discuss with son. Discussed contact precautions for newborns, someone who is pregnant, or has not had chickenpox virus or vaccination. Discussed good hand hygiene. No open areas. Discussed precautions should vesicles form or blister, keep covered. Printed information provided  -No labs today  Further disposition pending results of labs. Discussed med's effects and SE's.   Over 20 minutes of face to face interview, exam, counseling, chart review, and critical decision making was performed.   Future Appointments  Date Time Provider Department Center  09/10/2019  2:15 PM [facility]  10/30/2019  2:30 PM Judd Gaudier, NP [facility] None  02/04/2020  2:30 PM Lucky Cowboy, MD [facility] None  04/12/2020  2:00 PM Judd Gaudier, NP [facility] None  07/27/2020 11:00 AM Lucky Cowboy, MD [facility] None    ------------------------------------------------------------------------------------------------------------------   HPI 86 y.o.female presents for evaluation of rash.  She reports that home health aid noticed a rash on her stomach around her back that started 3 days ago.  Denies any pain or burning, itching but does endorse mild headache.  She has assistance with her care daily and denies any known exposure.  She or caregiver have not put anything on the rash.  Patient is alone in exam room today but was transported by her son.  She is hard of hearing but alert and oriented.  She ambulates with a walker.  She lives in an assisted living community.     Past Medical History:  Diagnosis Date  . Anxiety   . Aphasia as late effect  of cerebrovascular accident   . Atrial fibrillation (HCC)   . CKD (chronic kidney disease), stage II   . CVA (cerebral infarction)   . Diabetes mellitus type II   . DVT, lower extremity, distal, acute, right (HCC) 03/25/2018   Right peroneal vein 03/22/2018  . GERD (gastroesophageal reflux disease)   . Hiatal hernia   . HTN (hypertension)   . Hyperlipidemia   . IBS (irritable bowel syndrome)   . Stroke (HCC)   . Vitamin D deficiency      Allergies  Allergen Reactions  . Ace Inhibitors Cough  . Fosamax [Alendronate Sodium] Other (See Comments)    Heart burn  . Norvasc [Amlodipine Besylate] Other (See Comments)    edema  . Zocor [Simvastatin] Other (See Comments)    Elevated CPK    Current Outpatient Medications on File Prior to Visit  Medication Sig  . Cholecalciferol (VITAMIN D3) 125 MCG (5000 UT) TABS Take 5,000-10,000 Units by mouth See admin instructions. Take two tablets (10,000 units) by mouth on Monday, Wednesday, Friday after supper, take one tablet (5,000 units) on Sunday, Tuesday, Thursday, Saturday after supper  . CINNAMON PO Take 1,000 mg by mouth daily after breakfast.   . dicyclomine (BENTYL) 20 MG tablet Take 1 tablet 3 x day if needed for nausea, bloating, cramping or diarrhea  . furosemide (LASIX) 20 MG tablet Take 1 tablet Daily  for BP & Fluid Retention / Ankle Swelling. (Patient taking differently: Take 20 mg by mouth daily. for BP & Fluid Retention / Ankle Swelling.)  . loperamide (IMODIUM) 2 MG capsule TAKE 1 TABLET AT  NIGHT. (Patient taking differently: Take 2 mg by mouth daily with lunch. )  . losartan (COZAAR) 50 MG tablet Take 1 tablet Daily for BP (Patient taking differently: Take 50 mg by mouth daily. for BP)  . metoprolol tartrate (LOPRESSOR) 25 MG tablet TAKE (1/2) TABLET TWICE DAILY.  Marland Kitchen ondansetron (ZOFRAN ODT) 4 MG disintegrating tablet Dissolve 1 tablet under tongue every 6 to 8 hours if needed for nausea & / or vomitting  . pantoprazole (PROTONIX)  40 MG tablet Take 1 tablet Daily for Heart burn & Indigestion (Patient taking differently: Take 40 mg by mouth daily after breakfast. for Heart burn & Indigestion)  . potassium chloride (KLOR-CON) 10 MEQ tablet Take 1 tablet daily for Potassium (Patient taking differently: Take 10 mEq by mouth daily. )  . QUEtiapine (SEROQUEL) 25 MG tablet Take 1/2 tablet at Bedtime (Patient taking differently: Take 12.5 mg by mouth at bedtime. )  . warfarin (COUMADIN) 2 MG tablet TAKE 1 TABLET EACH DAY OR AS DIRECTED BY COUMADIN CLINIC  . warfarin (COUMADIN) 3 MG tablet TAKE 1 TABLET (3 MG) ON MONDAYS ONLY  . feeding supplement, ENSURE ENLIVE, (ENSURE ENLIVE) LIQD Take 237 mLs by mouth 2 (two) times daily between meals. (Patient not taking: Reported on 08/26/2019)   No current facility-administered medications on file prior to visit.    ROS: all negative except above.   Physical Exam:  BP (!) 148/80   Pulse 68   Temp (!) 97.2 F (36.2 C)   Wt 114 lb (51.7 kg)   SpO2 97%   BMI 20.85 kg/m   General Appearance: Well nourished, in no apparent distress. Eyes: PERRLA, EOMs, conjunctiva no swelling or erythema Sinuses: No Frontal/maxillary tenderness ENT/Mouth: Ext aud canals clear, TMs without erythema, bulging. No erythema, swelling, or exudate on post pharynx.  Tonsils not swollen or erythematous. Hearing decreased bilaterally. Neck: Supple, thyroid normal.  Respiratory: Respiratory effort normal, BS equal bilaterally without rales, rhonchi, wheezing or stridor.  Cardio: RRR with no MRGs. Brisk peripheral pulses without edema.  Abdomen: Soft, + BS.  Non tender, no guarding, rebound, hernias, masses. Lymphatics: Non tender without lymphadenopathy.  Musculoskeletal: Full ROM, 5/5 strength, normal gait.  Skin: erythematous diffuse rash on left lowe abdomen extending horizontally to left flank, dermatomes T10-12. Neuro: Cranial nerves intact. Normal muscle tone, no cerebellar symptoms. Sensation intact.   Psych: Awake and oriented X 3, normal affect, Insight and Judgment appropriate, baseline.    Garnet Sierras, NP 11:23 AM [facility]

## 2019-08-26 NOTE — Patient Instructions (Addendum)
   We will send in a prescription for acyclovir, anti-viral medication

## 2019-09-01 DIAGNOSIS — Z20828 Contact with and (suspected) exposure to other viral communicable diseases: Secondary | ICD-10-CM | POA: Diagnosis not present

## 2019-09-01 DIAGNOSIS — Z1159 Encounter for screening for other viral diseases: Secondary | ICD-10-CM | POA: Diagnosis not present

## 2019-09-02 ENCOUNTER — Telehealth: Payer: Self-pay

## 2019-09-02 NOTE — Telephone Encounter (Signed)
-----   Message from Elder Negus, NP sent at 09/02/2019  3:15 PM EDT ----- Regarding: Itching She can use Cortisone topical cream on the area first.   She can also try Calmoseptine ointment.  This is a thicker emollient, may get on clothing.    If this is not working please let us know we can try an oral tablet.  Thank you,           [name]

## 2019-09-02 NOTE — Telephone Encounter (Signed)
Pt's husband has been made aware of OTC meds to use for itching

## 2019-09-08 DIAGNOSIS — Z1159 Encounter for screening for other viral diseases: Secondary | ICD-10-CM | POA: Diagnosis not present

## 2019-09-08 DIAGNOSIS — Z20828 Contact with and (suspected) exposure to other viral communicable diseases: Secondary | ICD-10-CM | POA: Diagnosis not present

## 2019-09-10 ENCOUNTER — Other Ambulatory Visit: Payer: Self-pay | Admitting: Adult Health

## 2019-09-10 ENCOUNTER — Other Ambulatory Visit: Payer: Self-pay | Admitting: Internal Medicine

## 2019-09-10 ENCOUNTER — Other Ambulatory Visit: Payer: Self-pay

## 2019-09-10 ENCOUNTER — Ambulatory Visit (INDEPENDENT_AMBULATORY_CARE_PROVIDER_SITE_OTHER): Payer: Medicare Other

## 2019-09-10 DIAGNOSIS — Z0001 Encounter for general adult medical examination with abnormal findings: Secondary | ICD-10-CM | POA: Diagnosis not present

## 2019-09-10 DIAGNOSIS — Z79899 Other long term (current) drug therapy: Secondary | ICD-10-CM | POA: Diagnosis not present

## 2019-09-10 DIAGNOSIS — I4891 Unspecified atrial fibrillation: Secondary | ICD-10-CM

## 2019-09-10 DIAGNOSIS — Z8673 Personal history of transient ischemic attack (TIA), and cerebral infarction without residual deficits: Secondary | ICD-10-CM | POA: Diagnosis not present

## 2019-09-10 LAB — POCT INR: INR: 3.7 — AB (ref 2.0–3.0)

## 2019-09-10 NOTE — Patient Instructions (Addendum)
Description   Hold tomorrow's dosage of Warfarin, then start taking 2mg  daily except 1mg  on Mondays. Recheck INR in 3 weeks.  [facility] with new Warfarin directions, so they can fill her pill pack. Call [phone] at [facility] # [phone], Main # [phone].

## 2019-09-15 DIAGNOSIS — Z1159 Encounter for screening for other viral diseases: Secondary | ICD-10-CM | POA: Diagnosis not present

## 2019-09-15 DIAGNOSIS — Z20828 Contact with and (suspected) exposure to other viral communicable diseases: Secondary | ICD-10-CM | POA: Diagnosis not present

## 2019-09-16 IMAGING — CT CT RENAL STONE PROTOCOL
2 of 4 series · 16 of 46 positions shown, 18 images · non-contrast
Comparison: CT of the abdomen pelvis dated 03/16/2018

CLINICAL DATA: [AGE] female with abdominal pain, nausea
vomiting and diarrhea.

EXAM:
CT ABDOMEN AND PELVIS WITHOUT CONTRAST
TECHNIQUE: Multidetector CT imaging of the abdomen and pelvis was performed
following the standard protocol without IV contrast.

[Series 3: stone study 5.0 i30f 2 · axial · 0.59mm/px · z∈[+792,+1188]mm · 13 of 87 slices shown, 15 images]
[im 4/87  soft-tissue]
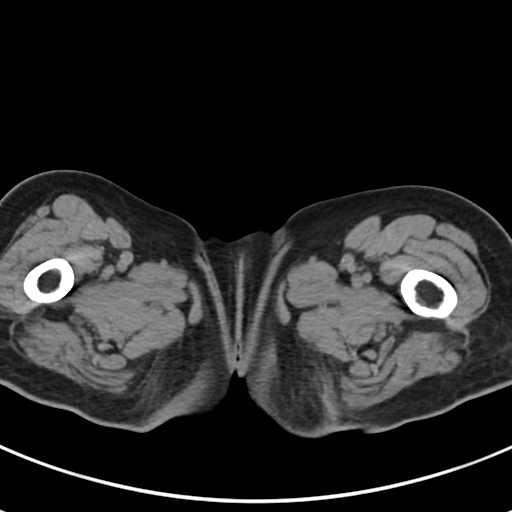
[im 4/87  bone]
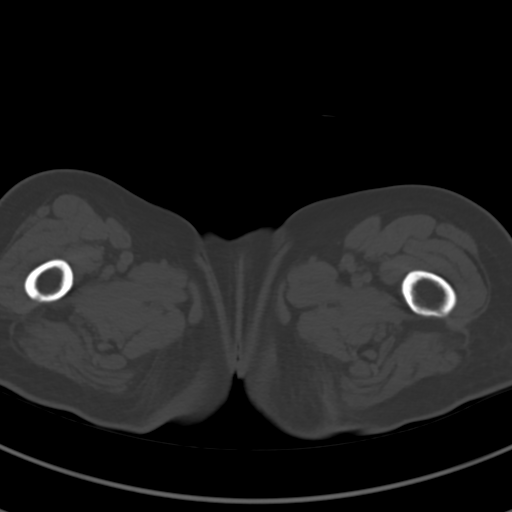
[im 10/87  soft-tissue]
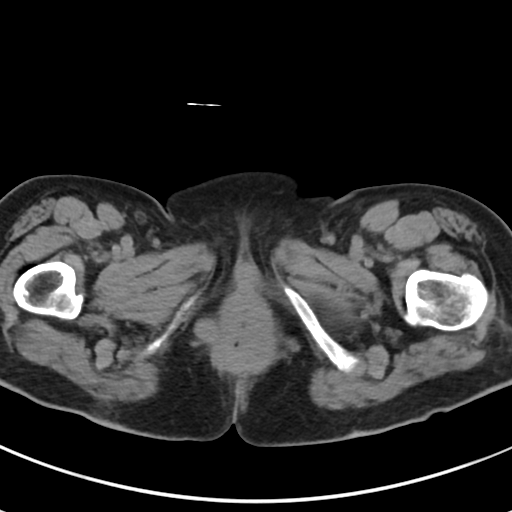
[im 17/87  soft-tissue]
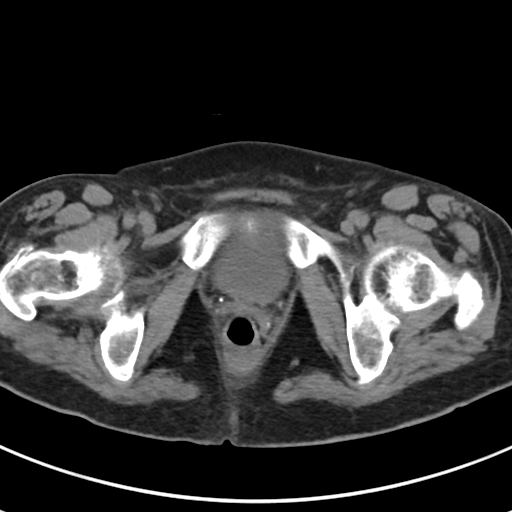
[im 24/87  soft-tissue]
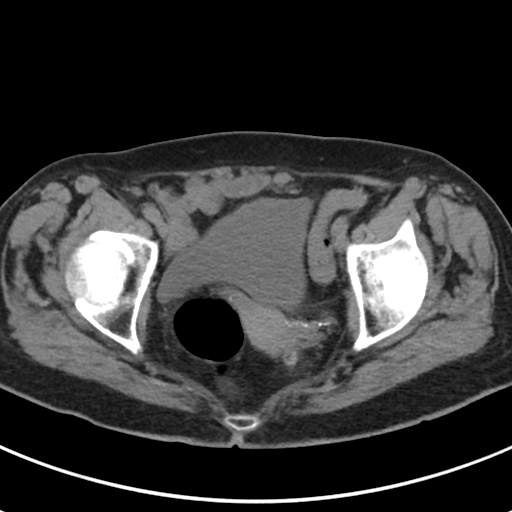
[im 30/87  soft-tissue]
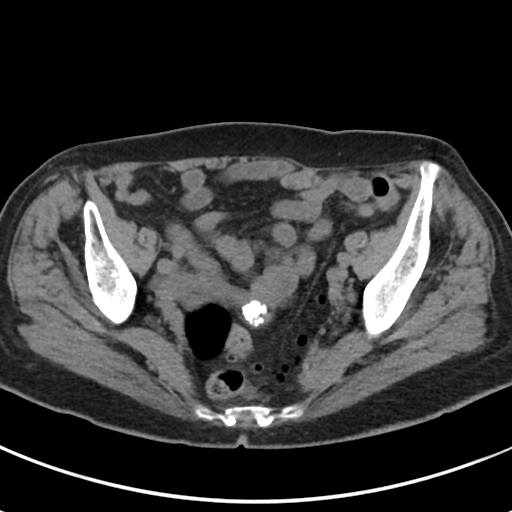
[im 37/87  soft-tissue]
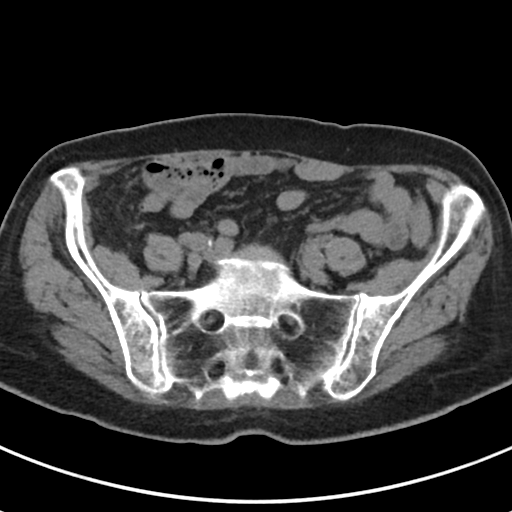
[im 44/87  soft-tissue]
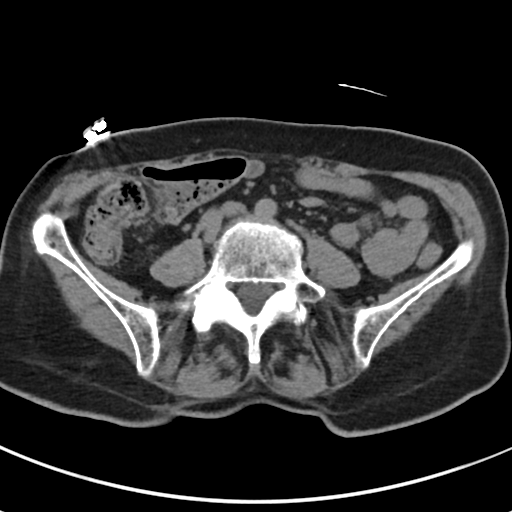
[im 50/87  soft-tissue]
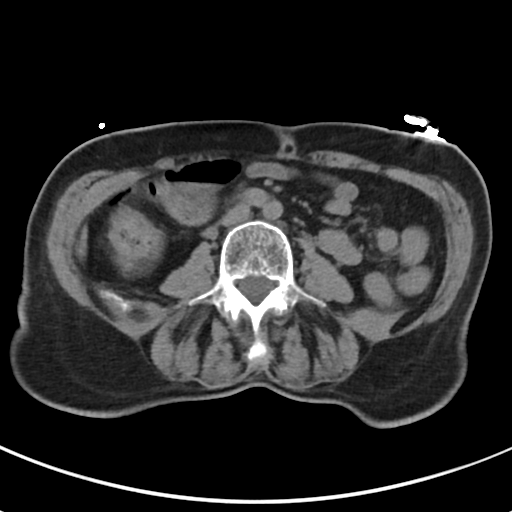
[im 57/87  soft-tissue]
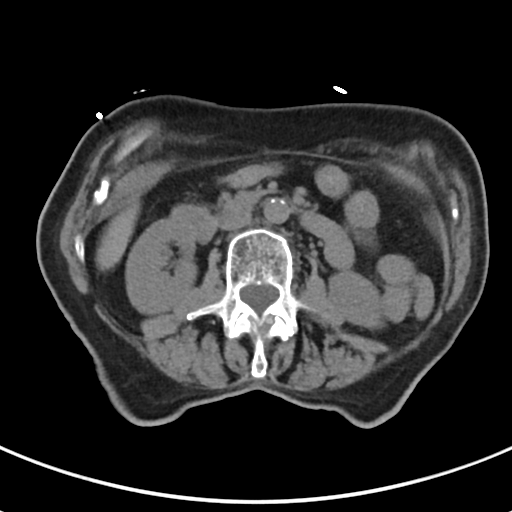
[im 57/87  bone]
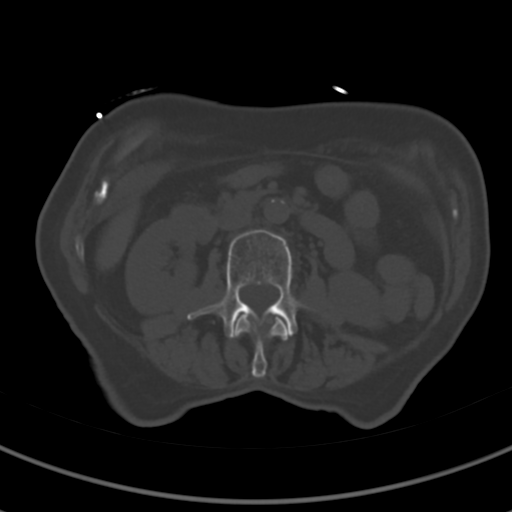
[im 63/87  soft-tissue]
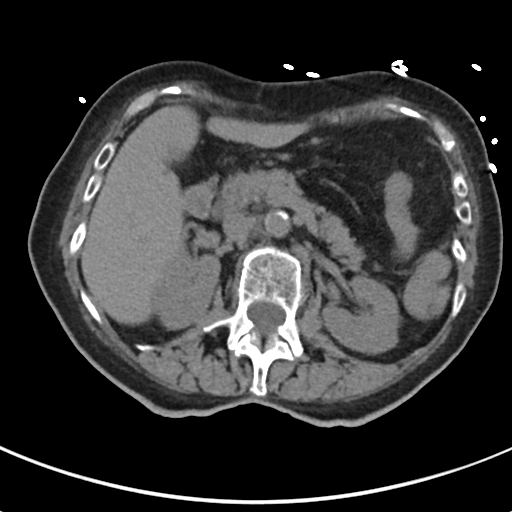
[im 70/87  soft-tissue]
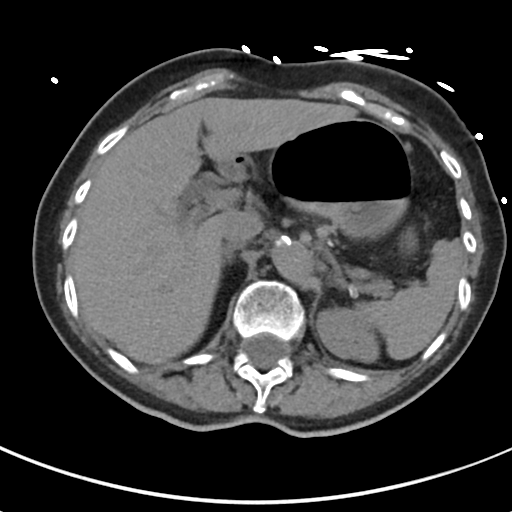
[im 77/87  soft-tissue]
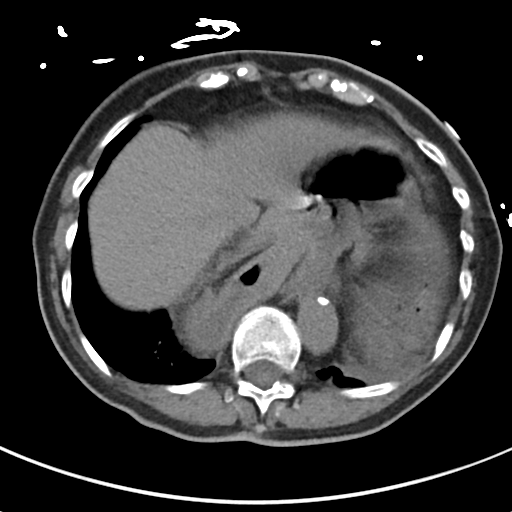
[im 83/87  soft-tissue]
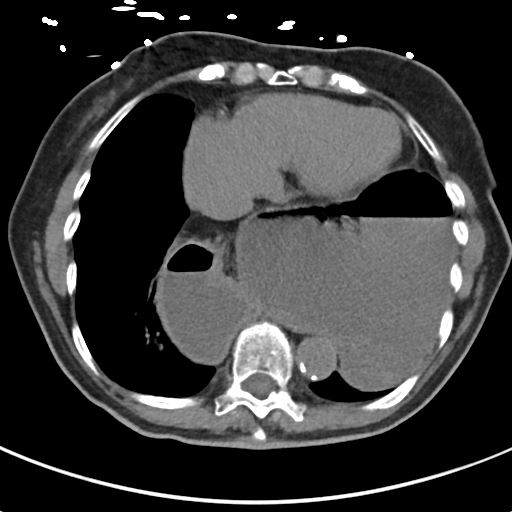

[Series 6: coronal soft tissue · coronal · 0.77mm/px · 3 of 99 slices shown]
[im 33/99  soft-tissue]
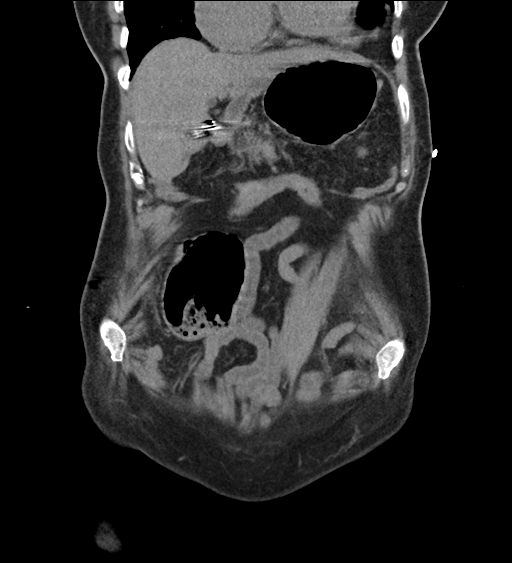
[im 44/99  soft-tissue]
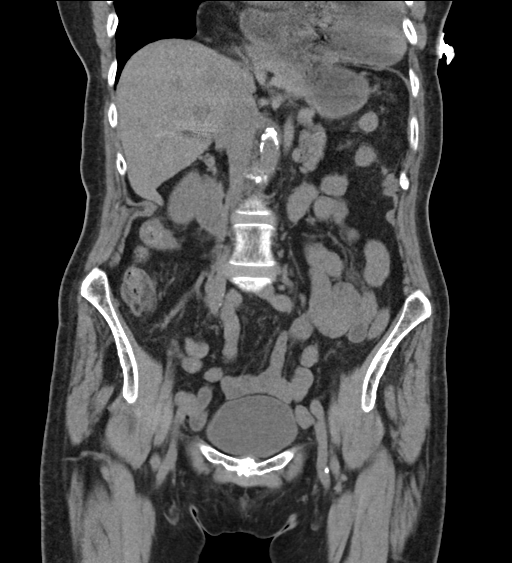
[im 55/99  soft-tissue]
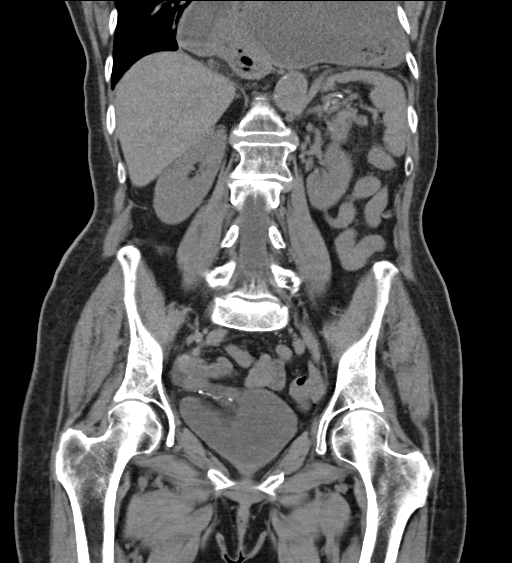

[16 of 46 positions shown; findings below may reference images not displayed]

FINDINGS: Evaluation of this exam is limited in the absence of intravenous
contrast.

Lower chest: Large hiatal hernia with the majority of the stomach
located lower chest. Associated atelectatic changes of the left lung
base. There is coronary vascular calcification.

No intra-abdominal free air or free fluid.

Hepatobiliary: The liver is unremarkable. No intrahepatic biliary
ductal dilatation. Cholecystectomy.

Pancreas: Unremarkable. No pancreatic ductal dilatation or
surrounding inflammatory changes.

Spleen: Normal in size without focal abnormality.

Adrenals/Urinary Tract: Adrenal glands are unremarkable. Kidneys are
normal, without renal calculi, focal lesion, or hydronephrosis.
Bladder is unremarkable.

Stomach/Bowel: There is a large hiatal hernia containing the
majority of the stomach with apparent organo-axial volvulus similar
to prior CT. There is distention of the stomach with air and liquid
content, likely representing a degree of gastric outlet obstruction.
There is no small bowel obstruction or active inflammation. There is
sigmoid diverticulosis without active inflammatory changes. The
appendix is not visualized with certainty. No inflammatory changes
identified in the right lower quadrant.

Vascular/Lymphatic: Moderate aortoiliac atherosclerotic disease. No
portal venous gas. There is no adenopathy.

Reproductive: Small uterus with a calcified fundal fibroid. No
adnexal masses.

Other: None

Musculoskeletal: Osteopenia with degenerative changes of the spine.
No acute osseous pathology.
IMPRESSION: 1. Large hiatal hernia containing the majority of the stomach with
possible partial gastric outlet obstruction. This finding is similar
to prior studies.
2. Sigmoid diverticulosis. No small bowel obstruction or active
inflammation.
3. No hydronephrosis or nephrolithiasis.

## 2019-09-22 DIAGNOSIS — Z1159 Encounter for screening for other viral diseases: Secondary | ICD-10-CM | POA: Diagnosis not present

## 2019-09-22 DIAGNOSIS — Z20828 Contact with and (suspected) exposure to other viral communicable diseases: Secondary | ICD-10-CM | POA: Diagnosis not present

## 2019-09-29 ENCOUNTER — Other Ambulatory Visit: Payer: Self-pay | Admitting: Cardiology

## 2019-09-29 ENCOUNTER — Other Ambulatory Visit: Payer: Self-pay | Admitting: Internal Medicine

## 2019-09-29 DIAGNOSIS — F5101 Primary insomnia: Secondary | ICD-10-CM

## 2019-09-29 DIAGNOSIS — Z20828 Contact with and (suspected) exposure to other viral communicable diseases: Secondary | ICD-10-CM | POA: Diagnosis not present

## 2019-09-29 DIAGNOSIS — Z1159 Encounter for screening for other viral diseases: Secondary | ICD-10-CM | POA: Diagnosis not present

## 2019-09-30 ENCOUNTER — Other Ambulatory Visit: Payer: Self-pay | Admitting: Adult Health Nurse Practitioner

## 2019-09-30 DIAGNOSIS — B029 Zoster without complications: Secondary | ICD-10-CM

## 2019-09-30 MED ORDER — ACYCLOVIR 400 MG PO TABS: ORAL_TABLET | ORAL | 0 refills | Status: DC

## 2019-09-30 NOTE — Progress Notes (Unsigned)
Patient contact, recurrance of leasions to right lower abdomen and couple to right flank.  Non-painful, some itching and topical cortisone cream has been working for this.  Spoke with Janet Berlin via telephone regarding symptoms and management.  Rx Acyclovir, see order.  Contact office with any new or worsening symptoms.   Sincerely,           Elder Negus, NP

## 2019-10-01 ENCOUNTER — Other Ambulatory Visit: Payer: Self-pay

## 2019-10-01 ENCOUNTER — Ambulatory Visit (INDEPENDENT_AMBULATORY_CARE_PROVIDER_SITE_OTHER): Payer: Medicare Other | Admitting: *Deleted

## 2019-10-01 DIAGNOSIS — Z79899 Other long term (current) drug therapy: Secondary | ICD-10-CM | POA: Diagnosis not present

## 2019-10-01 DIAGNOSIS — Z0001 Encounter for general adult medical examination with abnormal findings: Secondary | ICD-10-CM

## 2019-10-01 DIAGNOSIS — I4891 Unspecified atrial fibrillation: Secondary | ICD-10-CM | POA: Diagnosis not present

## 2019-10-01 DIAGNOSIS — Z8673 Personal history of transient ischemic attack (TIA), and cerebral infarction without residual deficits: Secondary | ICD-10-CM | POA: Diagnosis not present

## 2019-10-01 LAB — POCT INR: INR: 3.2 — AB (ref 2.0–3.0)

## 2019-10-01 NOTE — Patient Instructions (Signed)
Description   Hold tomorrow's dosage of Warfarin, then start taking 2mg  daily except 1mg  on Mondays and Fridays. Recheck INR in 3 weeks.  [facility] with new Warfarin directions, so they can fill her pill pack. Call 10-16-1989 at [facility] # [phone], Main # [phone].

## 2019-10-06 DIAGNOSIS — Z1159 Encounter for screening for other viral diseases: Secondary | ICD-10-CM | POA: Diagnosis not present

## 2019-10-06 DIAGNOSIS — Z20828 Contact with and (suspected) exposure to other viral communicable diseases: Secondary | ICD-10-CM | POA: Diagnosis not present

## 2019-10-08 ENCOUNTER — Telehealth: Payer: Self-pay | Admitting: *Deleted

## 2019-10-08 NOTE — Progress Notes (Signed)
Assessment and Plan:  Lisa Hopkins was seen today for extremity weakness.  Diagnoses and all orders for this visit:  Acute right-sided low back pain with right-sided sciatica Denies notable fall or injury prior to onset, has SI joint tenderness, some radicular sx, weakness though ? R/t pain, sensation and distal strength is intact,  + negative straight leg Prednisone was prescribed, RICE, and exercise given Natural history and expected course discussed. Questions answered. Neurosurgeon distributed. Short (2-4 day) period of relative rest recommended until acute symptoms improve. Ice to affected area as needed for local pain relief. prednisone taper as prescribed  Go to the ER if you have any significant worsening weakness in your legs, have trouble controlling your urine or bowels, or have worsening pain.  Call on Monday to report if not significantly improved, consider MRI lumbar/sacrum -     predniSONE (DELTASONE) 20 MG tablet; 2 tablets daily for 3 days, 1 tablet daily for 4 days. Take with food or milk.  Weakness Son notes ? Mildly more confused than typical, to provider she appears consistent with baseline; will check basic labs, r/o UTI As per above RLE weakness appears secondary to R sacral pain with some radicular sx; will trial conservative therapy over the weekend, close follow up if needed -     CBC with Differential/Platelet -     COMPLETE METABOLIC PANEL WITH GFR -     Urinalysis w microscopic + reflex cultur   Further disposition pending results of labs. Discussed med's effects and SE's.   Over 30 minutes of exam, counseling, chart review, and critical decision making was performed.   Future Appointments  Date Time Provider [place]  10/22/2019  2:15 PM [facility]  10/30/2019  2:30 PM Liane Comber, NP [facility] None  02/04/2020  2:30 PM Unk Pinto, MD [facility] None  04/12/2020  2:00 PM Liane Comber, NP  [facility] None  07/27/2020 11:00 AM Unk Pinto, MD [facility] None    ------------------------------------------------------------------------------------------------------------------   HPI BP 102/76   Pulse 64   Temp (!) 97.3 F (36.3 C)   Resp 16   SpO2 96%   86 y.o.female with hx of a. Fib on coumadin, CVA with residual aphasia, H8IF, diastolic CHF, GERD with hiatal hernia, severe protein/calorie malnutrition with recent falls and syncope (denies in recent weeks) presents for evaluation due to concerns with new difficulty ambulating.   Son accompanies her today, reports Tuesday morning she went to exercise class (attends daily except Wednesday), then she called son that afternoon stating that her legs would not work. She denies fall or unusual event prior to onset of sympotms. Denies back pain or extremity pain at that time. She reports she was sitting in her chair, attempt to stand up and was unable to do so. She called family, son drove over to check on her, was able to help her up and ambulate down the hall. At baseline she is able to sit to stand with relative ease though does ambulate with rolling walker.   Reports yesterday had similar difficulty walking in the morning, son attempted was able help her down the hall, was only able to make it 50 feet or so, typically can walk 1/4 mile with walker without problems. She reports starting yesterday has had R sacral, hip, thigh discomfort, reports minimal with sitting still but aggravated and attempting to stand. She struggles to characterize but does feel this discomfort is radiating down leg. She denies numbness/tingling. She denies upper extremity weakness.  She denies midline back pain. Hasn't tried anything for intervention. Denies losing control of bladder or bowels.   Son feel she may be somewhat more confused than usual. Denies headache, dizziness, vision changes. She has some residual expressive aphasia from previous CVA. She  denies any dysuria, urgency frequency, urine character change, incontinence. Denies fever/chills, cough, nausea/vomiting, abdominal pain, rash.   Her blood pressure has been controlled at home, today their BP is BP: 102/76  She does workout. She denies chest pain, shortness of breath, dizziness.  Last A1C in the office was:  Lab Results  Component Value Date   HGBA1C 6.2 (H) 07/15/2019     Past Medical History:  Diagnosis Date  . Anxiety   . Aphasia as late effect of cerebrovascular accident   . Atrial fibrillation (HCC)   . CKD (chronic kidney disease), stage II   . CVA (cerebral infarction)   . Diabetes mellitus type II   . DVT, lower extremity, distal, acute, right (HCC) 03/25/2018   Right peroneal vein 03/22/2018  . GERD (gastroesophageal reflux disease)   . Hiatal hernia   . HTN (hypertension)   . Hyperlipidemia   . IBS (irritable bowel syndrome)   . Stroke (HCC)   . Vitamin D deficiency      Allergies  Allergen Reactions  . Ace Inhibitors Cough  . Fosamax [Alendronate Sodium] Other (See Comments)    Heart burn  . Norvasc [Amlodipine Besylate] Other (See Comments)    edema  . Zocor [Simvastatin] Other (See Comments)    Elevated CPK    Current Outpatient Medications on File Prior to Visit  Medication Sig  . acyclovir (ZOVIRAX) 400 MG tablet Take 1 pill 3 times daily with food and 2 pills at night for 7 days.  . Cholecalciferol (VITAMIN D3) 125 MCG (5000 UT) TABS Take 5,000-10,000 Units by mouth See admin instructions. Take two tablets (10,000 units) by mouth on Monday, Wednesday, Friday after supper, take one tablet (5,000 units) on Sunday, Tuesday, Thursday, Saturday after supper  . CINNAMON PO Take 1,000 mg by mouth daily after breakfast.   . dicyclomine (BENTYL) 20 MG tablet Take 1 tablet 3 x day if needed for nausea, bloating, cramping or diarrhea  . feeding supplement, ENSURE ENLIVE, (ENSURE ENLIVE) LIQD Take 237 mLs by mouth 2 (two) times daily between meals.   . furosemide (LASIX) 20 MG tablet Take 1 tablet Daily  for BP & Fluid Retention / Ankle Swelling. (Patient taking differently: Take 20 mg by mouth daily. for BP & Fluid Retention / Ankle Swelling.)  . loperamide (IMODIUM) 2 MG capsule TAKE ONE CAPSULE IN THE P.M.  . losartan (COZAAR) 50 MG tablet Take 1 tablet Daily for BP (Patient taking differently: Take 50 mg by mouth daily. for BP)  . metoprolol tartrate (LOPRESSOR) 25 MG tablet TAKE (1/2) TABLET TWICE DAILY.  Marland Kitchen ondansetron (ZOFRAN ODT) 4 MG disintegrating tablet Dissolve 1 tablet under tongue every 6 to 8 hours if needed for nausea & / or vomitting  . pantoprazole (PROTONIX) 40 MG tablet Take 1 tablet Daily for Heart burn & Indigestion (Patient taking differently: Take 40 mg by mouth daily after breakfast. for Heart burn & Indigestion)  . potassium chloride (KLOR-CON) 10 MEQ tablet Take 1 tablet daily for Potassium (Patient taking differently: Take 10 mEq by mouth daily. )  . QUEtiapine (SEROQUEL) 25 MG tablet TAKE 1/2 TABLET AT BEDTIME.  Marland Kitchen warfarin (COUMADIN) 2 MG tablet TAKE 1 TABLET EACH DAY EXCEPT 1/2 TABLET ON MONDAYS  AND FRIDAYS OR AS DIRECTED BY ANTICOAGULATION CLINIC.   No current facility-administered medications on file prior to visit.    ROS: all negative except above.   Physical Exam:  BP 102/76   Pulse 64   Temp (!) 97.3 F (36.3 C)   Resp 16   SpO2 96%   General Appearance: Well nourished, thin elder female, well dressed/well groomed, in no apparent distress. Eyes: PERRLA, EOMs, conjunctiva no swelling or erythema ENT/Mouth: Ext aud canals clear, TMs without erythema, bulging. No erythema, swelling, or exudate on post pharynx.  Tonsils not swollen or erythematous. Somewhat HOH.   Neck: Supple Respiratory: Respiratory effort normal, BS equal bilaterally without rales, rhonchi, wheezing or stridor.  Cardio: Irregularly irregular with no MRGs. Brisk peripheral pulses without edema.  Abdomen: Soft, + BS.  Non tender, no  guarding, rebound, hernias, masses. Lymphatics: Non tender without lymphadenopathy.  Musculoskeletal: She is able to stand and support weight, pivots well with assistance for balance. Upper extremity strength 5/5, symmetrical; Patient is not able to ambulate well. Gait - shuffling gait with walker. Straight leg raising with dorsiflexion positive on R for radicular symptoms. Negative L.. Sensory exam in the legs are normal. Knee reflexes are diminished R<L Ankle reflexes are normal Strength is 5/5 hip abduction/adduction, 4/5 on R with extention/flexion, ankle flexion and extention are 5/5 and symmetrical. Strength testing elicits pain in sacrum/hip area per patient. There is SI tenderness to palpation.  There is not paraspinal muscle spasm.  There is not midline tenderness.  ROM of spine with  limited in all spheres due to pain.  Skin: Warm, dry without rashes, lesions, ecchymosis.  Neuro: Cranial nerves intact. Normal muscle tone, no cerebellar symptoms. Sensation intact. Some expressive aphasia consistent with baseline Psych: Awake and oriented X 3, normal affect, Insight and Judgment appropriate    Dan Maker, NP 5:49 PM [facility]

## 2019-10-08 NOTE — Telephone Encounter (Signed)
Spoke with the patient's son. He states weakness in legs started 10/07/2019 and continues today. The patient says she feels weak, but is not confused and is eating. Webb Laws, NP, is aware and the patient has an appointment to be evaluated here 10/08/2019.

## 2019-10-08 NOTE — Telephone Encounter (Signed)
Son called and states the patient is having difficulty walking. Asked for advise.

## 2019-10-09 ENCOUNTER — Ambulatory Visit (INDEPENDENT_AMBULATORY_CARE_PROVIDER_SITE_OTHER): Payer: Medicare Other | Admitting: Adult Health

## 2019-10-09 ENCOUNTER — Other Ambulatory Visit: Payer: Self-pay

## 2019-10-09 ENCOUNTER — Encounter: Payer: Self-pay | Admitting: Adult Health

## 2019-10-09 VITALS — BP 102/76 | HR 64 | Temp 97.3°F | Resp 16

## 2019-10-09 DIAGNOSIS — M5441 Lumbago with sciatica, right side: Secondary | ICD-10-CM | POA: Diagnosis not present

## 2019-10-09 DIAGNOSIS — R531 Weakness: Secondary | ICD-10-CM | POA: Diagnosis not present

## 2019-10-09 MED ORDER — PREDNISONE 20 MG PO TABS: ORAL_TABLET | ORAL | 0 refills | Status: DC

## 2019-10-09 NOTE — Patient Instructions (Addendum)
Go to the ER if you have any new weakness in your legs, have trouble controlling your urine or bowels, or have worsening pain.   Take prednisone with food - call on Monday or Tuesday if getting worse/not better, or if any new concerning symptoms    Radicular Pain Radicular pain is a type of pain that spreads from your back or neck along a spinal nerve. Spinal nerves are nerves that leave the spinal cord and go to the muscles. Radicular pain is sometimes called radiculopathy, radiculitis, or a pinched nerve. When you have this type of pain, you may also have weakness, numbness, or tingling in the area of your body that is supplied by the nerve. The pain may feel sharp and burning. Depending on which spinal nerve is affected, the pain may occur in the:  Neck area (cervical radicular pain). You may also feel pain, numbness, weakness, or tingling in the arms.  Mid-spine area (thoracic radicular pain). You would feel this pain in the back and chest. This type is rare.  Lower back area (lumbar radicular pain). You would feel this pain as low back pain. You may feel pain, numbness, weakness, or tingling in the buttocks or legs. Sciatica is a type of lumbar radicular pain that shoots down the back of the leg. Radicular pain occurs when one of the spinal nerves becomes irritated or squeezed (compressed). It is often caused by something pushing on a spinal nerve, such as one of the bones of the spine (vertebrae) or one of the round cushions between vertebrae (intervertebral disks). This can result from:  An injury.  Wear and tear or aging of a disk.  The growth of a bone spur that pushes on the nerve. Radicular pain often goes away when you follow instructions from your health care provider for relieving pain at home. Follow these instructions at home: Managing pain      If directed, put ice on the affected area: ? Put ice in a plastic bag. ? Place a towel between your skin and the  bag. ? Leave the ice on for 20 minutes, 2-3 times a day.  If directed, apply heat to the affected area as often as told by your health care provider. Use the heat source that your health care provider recommends, such as a moist heat pack or a heating pad. ? Place a towel between your skin and the heat source. ? Leave the heat on for 20-30 minutes. ? Remove the heat if your skin turns bright red. This is especially important if you are unable to feel pain, heat, or cold. You may have a greater risk of getting burned. Activity   Do not sit or rest in bed for long periods of time.  Try to stay as active as possible. Ask your health care provider what type of exercise or activity is best for you.  Avoid activities that make your pain worse, such as bending and lifting.  Do not lift anything that is heavier than 10 lb (4.5 kg), or the limit that you are told, until your health care provider says that it is safe.  Practice using proper technique when lifting items. Proper lifting technique involves bending your knees and rising up.  Do strength and range-of-motion exercises only as told by your health care provider or physical therapist. General instructions  Take over-the-counter and prescription medicines only as told by your health care provider.  Pay attention to any changes in your symptoms.  Keep  all follow-up visits as told by your health care provider. This is important. ? Your health care provider may send you to a physical therapist to help with this pain. Contact a health care provider if:  Your pain and other symptoms get worse.  Your pain medicine is not helping.  Your pain has not improved after a few weeks of home care.  You have a fever. Get help right away if:  You have severe pain, weakness, or numbness.  You have difficulty with bladder or bowel control. Summary  Radicular pain is a type of pain that spreads from your back or neck along a spinal  nerve.  When you have radicular pain, you may also have weakness, numbness, or tingling in the area of your body that is supplied by the nerve.  The pain may feel sharp or burning.  Radicular pain may be treated with ice, heat, medicines, or physical therapy. This information is not intended to replace advice given to you by your health care provider. Make sure you discuss any questions you have with your health care provider. Document Revised: 11/27/2017 Document Reviewed: 11/27/2017 Elsevier Patient Education  2020 [employer].

## 2019-10-11 LAB — COMPLETE METABOLIC PANEL WITH GFR
AG Ratio: 1.4 (calc) (ref 1.0–2.5)
ALT: 16 U/L (ref 6–29)
AST: 19 U/L (ref 10–35)
Albumin: 4.4 g/dL (ref 3.6–5.1)
Alkaline phosphatase (APISO): 131 U/L (ref 37–153)
BUN/Creatinine Ratio: 16 (calc) (ref 6–22)
BUN: 15 mg/dL (ref 7–25)
CO2: 31 mmol/L (ref 20–32)
Calcium: 10.8 mg/dL — ABNORMAL HIGH (ref 8.6–10.4)
Chloride: 101 mmol/L (ref 98–110)
Creat: 0.94 mg/dL — ABNORMAL HIGH (ref 0.60–0.88)
GFR, Est African American: 61 mL/min/{1.73_m2} (ref >=60)
GFR, Est Non African American: 53 mL/min/{1.73_m2} — ABNORMAL LOW (ref >=60)
Globulin: 3.2 g/dL (calc) (ref 1.9–3.7)
Glucose, Bld: 129 mg/dL — ABNORMAL HIGH (ref 65–99)
Potassium: 5.4 mmol/L — ABNORMAL HIGH (ref 3.5–5.3)
Sodium: 141 mmol/L (ref 135–146)
Total Bilirubin: 0.7 mg/dL (ref 0.2–1.2)
Total Protein: 7.6 g/dL (ref 6.1–8.1)

## 2019-10-11 LAB — CBC WITH DIFFERENTIAL/PLATELET
Absolute Monocytes: 585 cells/uL (ref 200–950)
Basophils Absolute: 53 cells/uL (ref 0–200)
Basophils Relative: 0.7 %
Eosinophils Absolute: 143 cells/uL (ref 15–500)
Eosinophils Relative: 1.9 %
HCT: 45.3 % — ABNORMAL HIGH (ref 35.0–45.0)
Hemoglobin: 14.9 g/dL (ref 11.7–15.5)
Lymphs Abs: 2955 cells/uL (ref 850–3900)
MCH: 30 pg (ref 27.0–33.0)
MCHC: 32.9 g/dL (ref 32.0–36.0)
MCV: 91.1 fL (ref 80.0–100.0)
MPV: 10.1 fL (ref 7.5–12.5)
Monocytes Relative: 7.8 %
Neutro Abs: 3765 cells/uL (ref 1500–7800)
Neutrophils Relative %: 50.2 %
Platelets: 251 10*3/uL (ref 140–400)
RBC: 4.97 10*6/uL (ref 3.80–5.10)
RDW: 12.9 % (ref 11.0–15.0)
Total Lymphocyte: 39.4 %
WBC: 7.5 10*3/uL (ref 3.8–10.8)

## 2019-10-11 LAB — URINALYSIS W MICROSCOPIC + REFLEX CULTURE
Bacteria, UA: NONE SEEN /HPF
Bilirubin Urine: NEGATIVE
Glucose, UA: NEGATIVE
Hgb urine dipstick: NEGATIVE
Ketones, ur: NEGATIVE
Nitrites, Initial: NEGATIVE
Protein, ur: NEGATIVE
Specific Gravity, Urine: 1.016 (ref 1.001–1.03)
pH: <=5 (ref 5.0–8.0)

## 2019-10-11 LAB — URINE CULTURE
MICRO NUMBER:: 10478773
SPECIMEN QUALITY:: ADEQUATE

## 2019-10-11 LAB — CULTURE INDICATED

## 2019-10-13 ENCOUNTER — Inpatient Hospital Stay (HOSPITAL_COMMUNITY)
Admission: EM | Admit: 2019-10-13 | Discharge: 2019-10-17 | DRG: 065 | Disposition: A | Discharge status: Skilled Nursing Facility | Payer: Medicare Other | Attending: Internal Medicine | Admitting: Internal Medicine

## 2019-10-13 ENCOUNTER — Inpatient Hospital Stay (HOSPITAL_COMMUNITY): Payer: Medicare Other

## 2019-10-13 ENCOUNTER — Other Ambulatory Visit: Payer: Self-pay

## 2019-10-13 ENCOUNTER — Emergency Department (HOSPITAL_COMMUNITY): Payer: Medicare Other

## 2019-10-13 ENCOUNTER — Telehealth: Payer: Self-pay | Admitting: Adult Health

## 2019-10-13 ENCOUNTER — Encounter (HOSPITAL_COMMUNITY): Payer: Self-pay

## 2019-10-13 DIAGNOSIS — I4821 Permanent atrial fibrillation: Secondary | ICD-10-CM | POA: Diagnosis not present

## 2019-10-13 DIAGNOSIS — I6381 Other cerebral infarction due to occlusion or stenosis of small artery: Secondary | ICD-10-CM | POA: Diagnosis not present

## 2019-10-13 DIAGNOSIS — G459 Transient cerebral ischemic attack, unspecified: Secondary | ICD-10-CM | POA: Diagnosis not present

## 2019-10-13 DIAGNOSIS — Z20828 Contact with and (suspected) exposure to other viral communicable diseases: Secondary | ICD-10-CM | POA: Diagnosis not present

## 2019-10-13 DIAGNOSIS — Z86718 Personal history of other venous thrombosis and embolism: Secondary | ICD-10-CM

## 2019-10-13 DIAGNOSIS — I69920 Aphasia following unspecified cerebrovascular disease: Secondary | ICD-10-CM | POA: Diagnosis not present

## 2019-10-13 DIAGNOSIS — E440 Moderate protein-calorie malnutrition: Secondary | ICD-10-CM | POA: Diagnosis present

## 2019-10-13 DIAGNOSIS — N182 Chronic kidney disease, stage 2 (mild): Secondary | ICD-10-CM | POA: Diagnosis present

## 2019-10-13 DIAGNOSIS — I63532 Cerebral infarction due to unspecified occlusion or stenosis of left posterior cerebral artery: Secondary | ICD-10-CM | POA: Diagnosis not present

## 2019-10-13 DIAGNOSIS — F0150 Vascular dementia without behavioral disturbance: Secondary | ICD-10-CM | POA: Diagnosis present

## 2019-10-13 DIAGNOSIS — G8191 Hemiplegia, unspecified affecting right dominant side: Secondary | ICD-10-CM | POA: Diagnosis not present

## 2019-10-13 DIAGNOSIS — R2689 Other abnormalities of gait and mobility: Secondary | ICD-10-CM | POA: Diagnosis not present

## 2019-10-13 DIAGNOSIS — R29702 NIHSS score 2: Secondary | ICD-10-CM | POA: Diagnosis present

## 2019-10-13 DIAGNOSIS — N390 Urinary tract infection, site not specified: Secondary | ICD-10-CM | POA: Diagnosis present

## 2019-10-13 DIAGNOSIS — E1151 Type 2 diabetes mellitus with diabetic peripheral angiopathy without gangrene: Secondary | ICD-10-CM | POA: Diagnosis present

## 2019-10-13 DIAGNOSIS — I69320 Aphasia following cerebral infarction: Secondary | ICD-10-CM | POA: Diagnosis not present

## 2019-10-13 DIAGNOSIS — Z20822 Contact with and (suspected) exposure to covid-19: Secondary | ICD-10-CM | POA: Diagnosis present

## 2019-10-13 DIAGNOSIS — E43 Unspecified severe protein-calorie malnutrition: Secondary | ICD-10-CM | POA: Diagnosis not present

## 2019-10-13 DIAGNOSIS — I6623 Occlusion and stenosis of bilateral posterior cerebral arteries: Secondary | ICD-10-CM | POA: Diagnosis present

## 2019-10-13 DIAGNOSIS — I4892 Unspecified atrial flutter: Secondary | ICD-10-CM | POA: Diagnosis not present

## 2019-10-13 DIAGNOSIS — I639 Cerebral infarction, unspecified: Secondary | ICD-10-CM | POA: Diagnosis not present

## 2019-10-13 DIAGNOSIS — M2550 Pain in unspecified joint: Secondary | ICD-10-CM | POA: Diagnosis not present

## 2019-10-13 DIAGNOSIS — I5030 Unspecified diastolic (congestive) heart failure: Secondary | ICD-10-CM | POA: Diagnosis present

## 2019-10-13 DIAGNOSIS — N1831 Chronic kidney disease, stage 3a: Secondary | ICD-10-CM | POA: Diagnosis not present

## 2019-10-13 DIAGNOSIS — Z7401 Bed confinement status: Secondary | ICD-10-CM | POA: Diagnosis not present

## 2019-10-13 DIAGNOSIS — R1314 Dysphagia, pharyngoesophageal phase: Secondary | ICD-10-CM | POA: Diagnosis present

## 2019-10-13 DIAGNOSIS — R001 Bradycardia, unspecified: Secondary | ICD-10-CM | POA: Diagnosis not present

## 2019-10-13 DIAGNOSIS — E876 Hypokalemia: Secondary | ICD-10-CM | POA: Diagnosis present

## 2019-10-13 DIAGNOSIS — K580 Irritable bowel syndrome with diarrhea: Secondary | ICD-10-CM | POA: Diagnosis present

## 2019-10-13 DIAGNOSIS — I361 Nonrheumatic tricuspid (valve) insufficiency: Secondary | ICD-10-CM | POA: Diagnosis not present

## 2019-10-13 DIAGNOSIS — E559 Vitamin D deficiency, unspecified: Secondary | ICD-10-CM | POA: Diagnosis present

## 2019-10-13 DIAGNOSIS — Z6820 Body mass index (BMI) 20.0-20.9, adult: Secondary | ICD-10-CM | POA: Diagnosis not present

## 2019-10-13 DIAGNOSIS — I130 Hypertensive heart and chronic kidney disease with heart failure and stage 1 through stage 4 chronic kidney disease, or unspecified chronic kidney disease: Secondary | ICD-10-CM | POA: Diagnosis present

## 2019-10-13 DIAGNOSIS — K219 Gastro-esophageal reflux disease without esophagitis: Secondary | ICD-10-CM | POA: Diagnosis present

## 2019-10-13 DIAGNOSIS — I69351 Hemiplegia and hemiparesis following cerebral infarction affecting right dominant side: Secondary | ICD-10-CM | POA: Diagnosis not present

## 2019-10-13 DIAGNOSIS — B9620 Unspecified Escherichia coli [E. coli] as the cause of diseases classified elsewhere: Secondary | ICD-10-CM | POA: Diagnosis present

## 2019-10-13 DIAGNOSIS — K449 Diaphragmatic hernia without obstruction or gangrene: Secondary | ICD-10-CM | POA: Diagnosis not present

## 2019-10-13 DIAGNOSIS — Z66 Do not resuscitate: Secondary | ICD-10-CM | POA: Diagnosis present

## 2019-10-13 DIAGNOSIS — R2681 Unsteadiness on feet: Secondary | ICD-10-CM | POA: Diagnosis not present

## 2019-10-13 DIAGNOSIS — Z7901 Long term (current) use of anticoagulants: Secondary | ICD-10-CM

## 2019-10-13 DIAGNOSIS — Z1159 Encounter for screening for other viral diseases: Secondary | ICD-10-CM | POA: Diagnosis not present

## 2019-10-13 DIAGNOSIS — I4891 Unspecified atrial fibrillation: Secondary | ICD-10-CM | POA: Diagnosis present

## 2019-10-13 DIAGNOSIS — E119 Type 2 diabetes mellitus without complications: Secondary | ICD-10-CM

## 2019-10-13 DIAGNOSIS — E1122 Type 2 diabetes mellitus with diabetic chronic kidney disease: Secondary | ICD-10-CM | POA: Diagnosis present

## 2019-10-13 DIAGNOSIS — Z79899 Other long term (current) drug therapy: Secondary | ICD-10-CM

## 2019-10-13 DIAGNOSIS — I63512 Cerebral infarction due to unspecified occlusion or stenosis of left middle cerebral artery: Secondary | ICD-10-CM | POA: Diagnosis not present

## 2019-10-13 DIAGNOSIS — M6281 Muscle weakness (generalized): Secondary | ICD-10-CM | POA: Diagnosis not present

## 2019-10-13 DIAGNOSIS — R531 Weakness: Secondary | ICD-10-CM | POA: Diagnosis not present

## 2019-10-13 DIAGNOSIS — Z888 Allergy status to other drugs, medicaments and biological substances status: Secondary | ICD-10-CM

## 2019-10-13 DIAGNOSIS — Z833 Family history of diabetes mellitus: Secondary | ICD-10-CM

## 2019-10-13 DIAGNOSIS — Z823 Family history of stroke: Secondary | ICD-10-CM

## 2019-10-13 DIAGNOSIS — R279 Unspecified lack of coordination: Secondary | ICD-10-CM | POA: Diagnosis not present

## 2019-10-13 DIAGNOSIS — I340 Nonrheumatic mitral (valve) insufficiency: Secondary | ICD-10-CM | POA: Diagnosis not present

## 2019-10-13 DIAGNOSIS — I10 Essential (primary) hypertension: Secondary | ICD-10-CM | POA: Diagnosis not present

## 2019-10-13 DIAGNOSIS — E785 Hyperlipidemia, unspecified: Secondary | ICD-10-CM | POA: Diagnosis present

## 2019-10-13 DIAGNOSIS — Z8249 Family history of ischemic heart disease and other diseases of the circulatory system: Secondary | ICD-10-CM

## 2019-10-13 DIAGNOSIS — Z8261 Family history of arthritis: Secondary | ICD-10-CM

## 2019-10-13 LAB — CBC
HCT: 47.7 % — ABNORMAL HIGH (ref 36.0–46.0)
Hemoglobin: 15 g/dL (ref 12.0–15.0)
MCH: 30.3 pg (ref 26.0–34.0)
MCHC: 31.4 g/dL (ref 30.0–36.0)
MCV: 96.4 fL (ref 80.0–100.0)
Platelets: 264 10*3/uL (ref 150–400)
RBC: 4.95 MIL/uL (ref 3.87–5.11)
RDW: 14.1 % (ref 11.5–15.5)
WBC: 13.1 10*3/uL — ABNORMAL HIGH (ref 4.0–10.5)
nRBC: 0 % (ref 0.0–0.2)

## 2019-10-13 LAB — URINALYSIS, ROUTINE W REFLEX MICROSCOPIC
Bilirubin Urine: NEGATIVE
Glucose, UA: NEGATIVE mg/dL
Ketones, ur: NEGATIVE mg/dL
Nitrite: POSITIVE — AB
Protein, ur: NEGATIVE mg/dL
Specific Gravity, Urine: 1.01 (ref 1.005–1.030)
pH: 5 (ref 5.0–8.0)

## 2019-10-13 LAB — COMPREHENSIVE METABOLIC PANEL
ALT: 24 U/L (ref 0–44)
AST: 26 U/L (ref 15–41)
Albumin: 3.9 g/dL (ref 3.5–5.0)
Alkaline Phosphatase: 102 U/L (ref 38–126)
Anion gap: 12 (ref 5–15)
BUN: 16 mg/dL (ref 8–23)
CO2: 22 mmol/L (ref 22–32)
Calcium: 9.3 mg/dL (ref 8.9–10.3)
Chloride: 103 mmol/L (ref 98–111)
Creatinine, Ser: 0.98 mg/dL (ref 0.44–1.00)
GFR calc Af Amer: 58 mL/min — ABNORMAL LOW (ref >60)
GFR calc non Af Amer: 50 mL/min — ABNORMAL LOW (ref >60)
Glucose, Bld: 131 mg/dL — ABNORMAL HIGH (ref 70–99)
Potassium: 3.2 mmol/L — ABNORMAL LOW (ref 3.5–5.1)
Sodium: 137 mmol/L (ref 135–145)
Total Bilirubin: 0.6 mg/dL (ref 0.3–1.2)
Total Protein: 7.2 g/dL (ref 6.5–8.1)

## 2019-10-13 LAB — DIFFERENTIAL
Abs Immature Granulocytes: 0.06 10*3/uL (ref 0.00–0.07)
Basophils Absolute: 0 10*3/uL (ref 0.0–0.1)
Basophils Relative: 0 %
Eosinophils Absolute: 0.1 10*3/uL (ref 0.0–0.5)
Eosinophils Relative: 1 %
Immature Granulocytes: 1 %
Lymphocytes Relative: 36 %
Lymphs Abs: 4.7 10*3/uL — ABNORMAL HIGH (ref 0.7–4.0)
Monocytes Absolute: 1.1 10*3/uL — ABNORMAL HIGH (ref 0.1–1.0)
Monocytes Relative: 8 %
Neutro Abs: 7.2 10*3/uL (ref 1.7–7.7)
Neutrophils Relative %: 54 %

## 2019-10-13 LAB — PROTIME-INR
INR: 2.3 — ABNORMAL HIGH (ref 0.8–1.2)
Prothrombin Time: 24.5 seconds — ABNORMAL HIGH (ref 11.4–15.2)

## 2019-10-13 LAB — SARS CORONAVIRUS 2 BY RT PCR (HOSPITAL ORDER, PERFORMED IN ~~LOC~~ HOSPITAL LAB): SARS Coronavirus 2: NEGATIVE

## 2019-10-13 LAB — CBG MONITORING, ED: Glucose-Capillary: 162 mg/dL — ABNORMAL HIGH (ref 70–99)

## 2019-10-13 LAB — APTT: aPTT: 43 seconds — ABNORMAL HIGH (ref 24–36)

## 2019-10-13 MED ORDER — ACETAMINOPHEN 325 MG PO TABS
650.0000 mg | ORAL_TABLET | ORAL | Status: DC | PRN
  Administered 2019-10-15 (×2): 650 mg via ORAL
  Filled 2019-10-13 (×2): qty 2

## 2019-10-13 MED ORDER — IOHEXOL 350 MG/ML SOLN
80.0000 mL | Freq: Once | INTRAVENOUS | Status: AC | PRN
  Administered 2019-10-13: 80 mL via INTRAVENOUS

## 2019-10-13 MED ORDER — SODIUM CHLORIDE 0.9 % IV SOLN: INTRAVENOUS | Status: DC

## 2019-10-13 MED ORDER — INSULIN ASPART 100 UNIT/ML ~~LOC~~ SOLN
0.0000 [IU] | SUBCUTANEOUS | Status: DC
  Administered 2019-10-13 – 2019-10-14 (×2): 1 [IU] via SUBCUTANEOUS

## 2019-10-13 MED ORDER — ACETAMINOPHEN 650 MG RE SUPP: 650.0000 mg | RECTAL | Status: DC | PRN

## 2019-10-13 MED ORDER — ACETAMINOPHEN 160 MG/5ML PO SOLN: 650.0000 mg | ORAL | Status: DC | PRN

## 2019-10-13 MED ORDER — STROKE: EARLY STAGES OF RECOVERY BOOK: Freq: Once | Status: DC

## 2019-10-13 MED ORDER — POTASSIUM CHLORIDE 10 MEQ/100ML IV SOLN
10.0000 meq | INTRAVENOUS | Status: DC
  Administered 2019-10-13: 10 meq via INTRAVENOUS
  Filled 2019-10-13: qty 100

## 2019-10-13 NOTE — ED Triage Notes (Signed)
Pt accompanied by son who reports on Tuesday pt was having weakness in her right leg and difficulty walking, pt seen by PCP on Thursday and was given prednisone for possible pinched nerve. Son reports on Saturday pt started having weakness in right arm and difficulty gripping utensils when eating. Pt LSN was Tuesday morning when she was at her exercise class. Pt alert and oriented at baseline at this time

## 2019-10-13 NOTE — Progress Notes (Signed)
86 y.o. female patient was evaluated last week with variable R lower extremity weakness sx, with lumbar pain, She was able to ambulate with walker, stand and pivot at that time, limited to 4/5 weakness of right quad/hamstrings with + straight leg, felt to most likely represent radicular sx. Upper extremity strength was felt to be symmetrical at that time. Today at follow up by telephone, son reports she is having upper and lower extremity weakness, apparently unable to hold a spoon, that this is ongoing since Tuesday last week and unchanged. With her hx of CVA and report of unilateral sx, significant concern for CVA, possible progressive sx from my exam, recommended she present to ED for STAT imaging and evaluation.

## 2019-10-13 NOTE — ED Notes (Signed)
Pt given crackers and apple juice by Cammy Copa, PA

## 2019-10-13 NOTE — Telephone Encounter (Signed)
Encounter created in error

## 2019-10-13 NOTE — ED Notes (Signed)
Patient transported to CT 

## 2019-10-13 NOTE — Telephone Encounter (Deleted)
Error

## 2019-10-13 NOTE — ED Provider Notes (Signed)
[facility] EMERGENCY DEPARTMENT Provider Note   CSN: [id number] Arrival date & time: 10/13/19  1038     History Chief Complaint  Patient presents with  . Weakness    Lisa Hopkins is a 86 y.o. female history of CVA, CKD, A. fib, GERD, hypertension, hyperlipidemia, dementia.  Patient presents today with her son for right-sided weakness.  Last Tuesday it was noticed that patient was having difficulty moving her right leg, 2 days later son brought her to the primary care doctor's office, they were concerned for possible pinched nerve in the lower back and start patient on prednisone.  On Saturday patient then developed right arm weakness, subsequently son noticed that patient was having a slower speech than normal and seemed more confused.  The symptoms have been continuous, they called the primary care doctor's office today and they were encouraged to come to the ER for evaluation of possible stroke.  Denies recent illness no fever/chills, no fall/injury, no chest pain, no nausea/vomiting, diarrhea or additional concerns. HPI     Past Medical History:  Diagnosis Date  . Anxiety   . Aphasia as late effect of cerebrovascular accident   . Atrial fibrillation (HCC)   . CKD (chronic kidney disease), stage II   . CVA (cerebral infarction)   . Diabetes mellitus type II   . DVT, lower extremity, distal, acute, right (HCC) 03/25/2018   Right peroneal vein 03/22/2018  . GERD (gastroesophageal reflux disease)   . Hiatal hernia   . HTN (hypertension)   . Hyperlipidemia   . IBS (irritable bowel syndrome)   . Stroke (HCC)   . Vitamin D deficiency     Patient Active Problem List   Diagnosis Date Noted  . Vascular dementia without behavioral disturbance (HCC) 12/31/2018  . Protein-calorie malnutrition, severe 06/20/2018  . Encounter for general adult medical examination with abnormal findings 12/21/2017  . Hiatal hernia 12/19/2017  . CKD stage 2 due to type 2  diabetes mellitus (HCC) 08/02/2017  . Diet-controlled type 2 diabetes mellitus (HCC) 12/25/2016  . Atherosclerosis of aorta (HCC) 12/03/2015  . GERD 09/25/2014  . Diastolic CHF (HCC) 08/04/2014  . Hyperlipidemia associated with type 2 diabetes mellitus (HCC)   . Vitamin D deficiency   . Anxiety state 03/15/2010  . Essential hypertension 03/15/2010  . Atrial fibrillation (HCC) 03/15/2010  . APHASIA DUE TO CEREBROVASCULAR DISEASE 03/15/2010  . History of CVA (cerebrovascular accident) 03/07/2010    Past Surgical History:  Procedure Laterality Date  . CATARACT EXTRACTION, BILATERAL    . CHOLECYSTECTOMY    . COLONOSCOPY  2005  . ESOPHAGOGASTRODUODENOSCOPY (EGD) WITH PROPOFOL N/A 11/28/2017   Procedure: ESOPHAGOGASTRODUODENOSCOPY (EGD) WITH PROPOFOL;  Surgeon: Graylin Shiver, MD;  Location: [facility] ENDOSCOPY;  Service: Endoscopy;  Laterality: N/A;  . TONSILLECTOMY    . UPPER GASTROINTESTINAL ENDOSCOPY  2005     OB History   No obstetric history on file.     Family History  Problem Relation Age of Onset  . Diabetes Mother   . Stroke Mother   . Hypertension Brother   . Heart disease Brother   . Arthritis Sister        Rhematoid  . Arthritis Sister        Rhematoid    Social History   Tobacco Use  . Smoking status: Never Smoker  . Smokeless tobacco: Never Used  Substance Use Topics  . Alcohol use: No  . Drug use: No    Home Medications Prior to  Admission medications   Medication Sig Start Date End Date Taking? Authorizing Provider  acyclovir (ZOVIRAX) 400 MG tablet Take 1 pill 3 times daily with food and 2 pills at night for 7 days. 09/30/19   Garnet Sierras, NP  Cholecalciferol (VITAMIN D3) 125 MCG (5000 UT) TABS Take 5,000-10,000 Units by mouth See admin instructions. Take two tablets (10,000 units) by mouth on Monday, Wednesday, Friday after supper, take one tablet (5,000 units) on Sunday, Tuesday, Thursday, Saturday after supper    [provider]  CINNAMON PO  Take 1,000 mg by mouth daily after breakfast.     [provider]  dicyclomine (BENTYL) 20 MG tablet Take 1 tablet 3 x day if needed for nausea, bloating, cramping or diarrhea 05/16/18   Unk Pinto, MD  feeding supplement, ENSURE ENLIVE, (ENSURE ENLIVE) LIQD Take 237 mLs by mouth 2 (two) times daily between meals. 06/20/18   Debbe Odea, MD  furosemide (LASIX) 20 MG tablet Take 1 tablet Daily  for BP & Fluid Retention / Ankle Swelling. Patient taking differently: Take 20 mg by mouth daily. for BP & Fluid Retention / Ankle Swelling. 04/14/19   Unk Pinto, MD  loperamide (IMODIUM) 2 MG capsule TAKE ONE CAPSULE IN THE P.M. 09/10/19   Liane Comber, NP  losartan (COZAAR) 50 MG tablet Take 1 tablet Daily for BP Patient taking differently: Take 50 mg by mouth daily. for BP 07/04/19   Unk Pinto, MD  metoprolol tartrate (LOPRESSOR) 25 MG tablet TAKE (1/2) TABLET TWICE DAILY. 09/10/19   Liane Comber, NP  ondansetron (ZOFRAN ODT) 4 MG disintegrating tablet Dissolve 1 tablet under tongue every 6 to 8 hours if needed for nausea & / or vomitting 04/10/18   Unk Pinto, MD  pantoprazole (PROTONIX) 40 MG tablet Take 1 tablet Daily for Heart burn & Indigestion Patient taking differently: Take 40 mg by mouth daily after breakfast. for Heart burn & Indigestion 02/14/19   Unk Pinto, MD  potassium chloride (KLOR-CON) 10 MEQ tablet Take 1 tablet daily for Potassium Patient taking differently: Take 10 mEq by mouth daily.  07/08/19   Unk Pinto, MD  predniSONE (DELTASONE) 20 MG tablet 2 tablets daily for 3 days, 1 tablet daily for 4 days. Take with food or milk. 10/09/19   Liane Comber, NP  QUEtiapine (SEROQUEL) 25 MG tablet TAKE 1/2 TABLET AT BEDTIME. 09/29/19   Unk Pinto, MD  warfarin (COUMADIN) 2 MG tablet TAKE 1 TABLET EACH DAY EXCEPT 1/2 TABLET ON MONDAYS AND FRIDAYS OR AS DIRECTED BY ANTICOAGULATION CLINIC. 10/01/19   Dorothy Spark, MD    Allergies    Ace  inhibitors, Fosamax [alendronate sodium], Norvasc [amlodipine besylate], and Zocor [simvastatin]  Review of Systems   Review of Systems Ten systems are reviewed and are negative for acute change except as noted in the HPI  Physical Exam Updated Vital Signs BP (!) 154/79 (BP Location: Right Arm)   Pulse (!) 56   Temp (!) 97.4 F (36.3 C) (Oral)   Resp (!) 22   SpO2 97%   Physical Exam Constitutional:      General: She is not in acute distress.    Appearance: Normal appearance. She is well-developed. She is not ill-appearing or diaphoretic.  HENT:     Head: Normocephalic and atraumatic.     Right Ear: External ear normal.     Left Ear: External ear normal.     Nose: Nose normal.  Eyes:     General: Vision grossly intact. Gaze aligned  appropriately.     Pupils: Pupils are equal, round, and reactive to light.  Neck:     Trachea: Trachea and phonation normal. No tracheal deviation.  Pulmonary:     Effort: Pulmonary effort is normal. No respiratory distress.  Abdominal:     General: There is no distension.     Palpations: Abdomen is soft.     Tenderness: There is no abdominal tenderness. There is no guarding or rebound.  Musculoskeletal:        General: Normal range of motion.     Cervical back: Normal range of motion.  Skin:    General: Skin is warm and dry.  Neurological:     Mental Status: She is alert.     GCS: GCS eye subscore is 4. GCS verbal subscore is 5. GCS motor subscore is 6.     Comments: Mental Status: Alert, oriented, slow speech, follows commands appropriately. Cranial Nerves: II: Peripheral visual fields grossly normal, pupils equal, round, reactive to light III,IV, VI: ptosis not present, extra-ocular motions intact bilaterally V,VII: Questionable right facial droop (patient's son does not feel there is any droop), eyebrows raise symmetric, facial light touch sensation equal VIII: hearing grossly normal to voice X: uvula elevates symmetrically XI:  bilateral shoulder shrug symmetric and strong XII: midline tongue extension without fassiculations Motor: Normal tone.  2/5 strength right upper and lower extremities compared to left. Sensory: Sensation intact to light touch in all extremities.Negative Romberg.  Cerebellar: Positive right pronator drift, cannot perform finger-nose-finger with right arm due to weakness.   Psychiatric:        Behavior: Behavior normal.     ED Results / Procedures / Treatments   Labs (all labs ordered are listed, but only abnormal results are displayed) Labs Reviewed  PROTIME-INR - Abnormal; Notable for the following components:      Result Value   Prothrombin Time 24.5 (*)    INR 2.3 (*)    All other components within normal limits  APTT - Abnormal; Notable for the following components:   aPTT 43 (*)    All other components within normal limits  CBC - Abnormal; Notable for the following components:   WBC 13.1 (*)    HCT 47.7 (*)    All other components within normal limits  DIFFERENTIAL - Abnormal; Notable for the following components:   Lymphs Abs 4.7 (*)    Monocytes Absolute 1.1 (*)    All other components within normal limits  COMPREHENSIVE METABOLIC PANEL - Abnormal; Notable for the following components:   Potassium 3.2 (*)    Glucose, Bld 131 (*)    GFR calc non Af Amer 50 (*)    GFR calc Af Amer 58 (*)    All other components within normal limits  SARS CORONAVIRUS 2 BY RT PCR (HOSPITAL ORDER, PERFORMED IN [facility])  URINALYSIS, ROUTINE W REFLEX MICROSCOPIC    EKG EKG Interpretation  Date/Time:  Monday Oct 13 2019 10:53:26 EDT Ventricular Rate:  56 PR Interval:    QRS Duration: 78 QT Interval:  404 QTC Calculation: 389 R Axis:   98 Text Interpretation: Atrial fibrillation with slow ventricular response Rightward axis Anterior infarct , age undetermined Abnormal ECG Confirmed by Pricilla Loveless [phone]) on 10/13/2019 11:21:37 AM   Radiology CT HEAD WO  CONTRAST  Result Date: 10/13/2019 CLINICAL DATA:  Right leg weakness EXAM: CT HEAD WITHOUT CONTRAST TECHNIQUE: Contiguous axial images were obtained from the base of the skull through the vertex without  intravenous contrast. COMPARISON:  07/18/2019 FINDINGS: Brain: There is no acute intracranial hemorrhage, mass effect, or edema. No new loss of gray-white differentiation. There are chronic infarcts including involvement of the left frontal lobe, central white matter, deep gray nuclei, and right cerebellum. This is superimposed on patchy and confluent hypoattenuation in the supratentorial white matter, which probably reflects moderate chronic microvascular ischemic changes. There is no extra-axial fluid collection. There is mild ex vacuo dilatation of the left lateral ventricle. Vascular: There is atherosclerotic calcification at the skull base. Skull: Calvarium is unremarkable. Sinuses/Orbits: Minor mucosal thickening. No significant orbital abnormality. Other: None. IMPRESSION: No acute intracranial hemorrhage, mass effect, or evidence of acute infarction. Chronic/nonemergent findings detailed above. Electronically Signed   By: Guadlupe Spanish M.D.   On: 10/13/2019 12:18    Procedures Procedures (including critical care time)  Medications Ordered in ED Medications - No data to display  ED Course  I have reviewed the triage vital signs and the nursing notes.  Pertinent labs & imaging results that were available during my care of the patient were reviewed by me and considered in my medical decision making (see chart for details).  Clinical Course as of Oct 12 1449  Mon Oct 13, 2019  1254 Dr. Wilford Corner   [BM]    Clinical Course User Index [BM] Elizabeth Palau   MDM Rules/Calculators/A&P                     86 year old female history as detailed above presents today for right-sided weakness ongoing for 7 days, initially only involvement of the leg however subsequently began to involve the  right arm followed by slowed speech and increasing confusion.  Patient's son reports that the prior CVA left patient only with residual intermittent confusion, patient son reports all of these symptoms are new this week.  I have reviewed and interpreted the following labs.  CMP shows no emergent electrolyte derangement, evidence of acute kidney injury or emergent elevation of LFTs.  PT/INR elevated.  aPTT elevated.  CBC shows leukocytosis of 13.1, no evidence of anemia.  EKG: Atrial fibrillation with slow ventricular response Rightward axis Anterior infarct , age undetermined Abnormal ECG Confirmed by Pricilla Loveless [phone]) on 10/13/2019 11:21:37 AM   CT Head:  IMPRESSION: No acute intracranial hemorrhage, mass effect, or evidence of acute infarction. Chronic/nonemergent findings detailed above. Electronically Signed   By: Guadlupe Spanish M.D.   On: 10/13/2019 12:18  - Discussed case with neurology Dr. Jerrell Belfast at 12:54 PM.  He asked that we obtain MRI of the brain prior to calling for admission.  Plan of care is to call neurology back with results. - Patient reassessed resting comfortably in bed son at bedside vital signs stable.  They state understanding of care plan and need for MRI they have no further questions or concerns at this point.  Covid and UA pending. - Care handoff given to Arthor Captain, PA-C at shift change, plan of care is to follow-up on pending studies and recontact neurology for recommendations.   Note: Portions of this report may have been transcribed using voice recognition software. Every effort was made to ensure accuracy; however, inadvertent computerized transcription errors may still be present. Final Clinical Impression(s) / ED Diagnoses Final diagnoses:  None    Rx / DC Orders ED Discharge Orders    None       Elizabeth Palau 10/13/19 1458    Pricilla Loveless, MD 10/17/19 [phone]

## 2019-10-13 NOTE — ED Notes (Signed)
Pt transported to MRI 

## 2019-10-13 NOTE — ED Notes (Signed)
Pt failed swallow screen per son pt has hiatal hernia and has been able to swallow adequately for years. Cammy Copa, PA notified.

## 2019-10-13 NOTE — Consult Note (Signed)
Referring Physician: Dr. Toniann Fail    Chief Complaint: RLE weakness with difficulty ambulating.   HPI: Lisa Hopkins is an 86 y.o. female with a PMHx of stroke with aphasia, CKD, atrial fibrillation, DM2, HTN, HLD, RLE DVT in 2019, who presents to the ED for evaluation of new onset right sided weakness, with symptoms starting last Tuesday.   She was accompanied to the ED by her son who reported that last Tuesday she was having weakness in her right leg with difficulty walking. She was seen by her PCP on Thursday and was given prednisone for possible pinched nerve oin her lower back. Son reported that on Saturday the patient started having weakness in her right arm and difficulty gripping utensils when eating, as well as slowed speech and confusion.   MRI brain obtained in the ED reveals an acute infarct in the left pons. There is a subacute white matter lacunar infarct in the right centrum semiovale, most likely secondary to small vessel disease in the context of a background of white matter changes consistent with underlying advanced chronic ischemic disease. Diffuse atrophy is appreciated. Chronic left frontal lobe and right cerebellar medium-sized ischemic infarctions are also noted.   WBC is 13.1. She has also been diagnosed with a UTI in the ED.   LSN: 6 days prior to presentation tPA Given: No: Out of time window  Past Medical History:  Diagnosis Date  . Anxiety   . Aphasia as late effect of cerebrovascular accident   . Atrial fibrillation (HCC)   . CKD (chronic kidney disease), stage II   . CVA (cerebral infarction)   . Diabetes mellitus type II   . DVT, lower extremity, distal, acute, right (HCC) 03/25/2018   Right peroneal vein 03/22/2018  . GERD (gastroesophageal reflux disease)   . Hiatal hernia   . HTN (hypertension)   . Hyperlipidemia   . IBS (irritable bowel syndrome)   . Stroke (HCC)   . Vitamin D deficiency     Past Surgical History:  Procedure Laterality Date   . CATARACT EXTRACTION, BILATERAL    . CHOLECYSTECTOMY    . COLONOSCOPY  2005  . ESOPHAGOGASTRODUODENOSCOPY (EGD) WITH PROPOFOL N/A 11/28/2017   Procedure: ESOPHAGOGASTRODUODENOSCOPY (EGD) WITH PROPOFOL;  Surgeon: Graylin Shiver, MD;  Location[facility] ENDOSCOPY;  Service: Endoscopy;  Laterality: N/A;  . TONSILLECTOMY    . UPPER GASTROINTESTINAL ENDOSCOPY  2005    Family History  Problem Relation Age of Onset  . Diabetes Mother   . Stroke Mother   . Hypertension Brother   . Heart disease Brother   . Arthritis Sister        Rhematoid  . Arthritis Sister        Rhematoid   Social History:  reports that she has never smoked. She has never used smokeless tobacco. She reports that she does not drink alcohol or use drugs.  Allergies:  Allergies  Allergen Reactions  . Ace Inhibitors Cough  . Fosamax [Alendronate Sodium] Other (See Comments)    Heart burn  . Norvasc [Amlodipine Besylate] Other (See Comments)    edema  . Zocor [Simvastatin] Other (See Comments)    Elevated CPK    Medications:  Prior to Admission: (Not in a hospital admission)  Scheduled: .  stroke: mapping our early stages of recovery book   Does not apply Once  . insulin aspart  0-6 Units Subcutaneous Q4H   Continuous: . sodium chloride 75 mL/hr at 10/13/19 2201    ROS: Has had  no fevers or chills. No CP. No N/V. No falls. Other symptoms as per HPI with ROS otherwise negative.    Physical Examination: Blood pressure (!) 149/91, pulse 62, temperature (!) 97.4 F (36.3 C), temperature source Oral, resp. rate 16, SpO2 96 %.  HEENT: [place]/AT Lungs: Respirations unlabored Ext: No edema  Neurologic Examination: Mental Status: Awake, alert and oriented x 5. Speech is slowed and mildly dysarthric, but fluent with intact comprehension and naming.  Cranial Nerves: II:  Visual fields intact with no extinction to DSS. PERRL.  III,IV, VI: No ptosis. EOMI. No nystagmus.  V,VII: Smile symmetric, facial temp sensation  equal bilaterally VIII: Mildly HOH IX,X: Hypophonic speech XI: Head is midline XII: midline tongue extension  Motor: RUE 3-4/5 proximally and distally.  RLE 3/5 LUE and LLE 5/5 No atrophy noted.  Sensory: Temp and light touch intact throughout, bilaterally. Denies any asymmetry regarding sensation. No extinction to DSS.  Deep Tendon Reflexes:  2+ left biceps and brachioradialis 1+ right biceps and brachioradialis 3+ left patellar, 1+ right patellar Toes upgoing bilaterally.  Cerebellar: No ataxia with FNF on the left. Unable to perform on the right.  Gait: Deferred  Results for orders placed or performed during the hospital encounter of 10/13/19 (from the past 48 hour(s))  Protime-INR     Status: Abnormal   Collection Time: 10/13/19 11:09 AM  Result Value Ref Range   Prothrombin Time 24.5 (H) 11.4 - 15.2 seconds   INR 2.3 (H) 0.8 - 1.2    Comment: (NOTE) INR goal varies based on device and disease states. Performed at [facility], [place] [address] [place], [place] [address]   APTT     Status: Abnormal   Collection Time: 10/13/19 11:09 AM  Result Value Ref Range   aPTT 43 (H) 24 - 36 seconds    Comment:        IF BASELINE aPTT IS ELEVATED, SUGGEST PATIENT RISK ASSESSMENT BE USED TO DETERMINE APPROPRIATE ANTICOAGULANT THERAPY. Performed at [facility], [place] [address], [place], [place] [address]   CBC     Status: Abnormal   Collection Time: 10/13/19 11:09 AM  Result Value Ref Range   WBC 13.1 (H) 4.0 - 10.5 K/uL   RBC 4.95 3.87 - 5.11 MIL/uL   Hemoglobin 15.0 12.0 - 15.0 g/dL   HCT 47.7 (H) 36.0 - 46.0 %   MCV 96.4 80.0 - 100.0 fL   MCH 30.3 26.0 - 34.0 pg   MCHC 31.4 30.0 - 36.0 g/dL   RDW 14.1 11.5 - 15.5 %   Platelets 264 150 - 400 K/uL   nRBC 0.0 0.0 - 0.2 %    Comment: Performed at [facility], [place] [address], [place], [place] [address]  Differential     Status: Abnormal   Collection Time: 10/13/19 11:09 AM  Result Value Ref Range    Neutrophils Relative % 54 %   Neutro Abs 7.2 1.7 - 7.7 K/uL   Lymphocytes Relative 36 %   Lymphs Abs 4.7 (H) 0.7 - 4.0 K/uL   Monocytes Relative 8 %   Monocytes Absolute 1.1 (H) 0.1 - 1.0 K/uL   Eosinophils Relative 1 %   Eosinophils Absolute 0.1 0.0 - 0.5 K/uL   Basophils Relative 0 %   Basophils Absolute 0.0 0.0 - 0.1 K/uL   Immature Granulocytes 1 %   Abs Immature Granulocytes 0.06 0.00 - 0.07 K/uL    Comment: Performed at [facility], [address] [place], [place] [address]  Comprehensive metabolic panel     Status: Abnormal   Collection Time: 10/13/19 11:09 AM  Result Value Ref Range   Sodium 137 135 - 145 mmol/L   Potassium 3.2 (L) 3.5 - 5.1 mmol/L   Chloride 103 98 - 111 mmol/L   CO2 22 22 - 32 mmol/L   Glucose, Bld 131 (H) 70 - 99 mg/dL    Comment: Glucose reference range applies only to samples taken after fasting for at least 8 hours.   BUN 16 8 - 23 mg/dL   Creatinine, Ser 4.81 0.44 - 1.00 mg/dL   Calcium 9.3 8.9 - 85.6 mg/dL   Total Protein 7.2 6.5 - 8.1 g/dL   Albumin 3.9 3.5 - 5.0 g/dL   AST 26 15 - 41 U/L   ALT 24 0 - 44 U/L   Alkaline Phosphatase 102 38 - 126 U/L   Total Bilirubin 0.6 0.3 - 1.2 mg/dL   GFR calc non Af Amer 50 (L) >60 mL/min   GFR calc Af Amer 58 (L) >60 mL/min   Anion gap 12 5 - 15    Comment: Performed at [facility], [address], [place], [place] [address]  SARS Coronavirus 2 by RT PCR (hospital order, performed in [facility] hospital lab) Nasopharyngeal Nasopharyngeal Swab     Status: None   Collection Time: 10/13/19 12:30 PM   Specimen: Nasopharyngeal Swab  Result Value Ref Range   SARS Coronavirus 2 NEGATIVE NEGATIVE    Comment: (NOTE) SARS-CoV-2 target nucleic acids are NOT DETECTED. The SARS-CoV-2 RNA is generally detectable in upper and lower respiratory specimens during the acute phase of infection. The lowest concentration of SARS-CoV-2 viral copies this assay can detect is 250 copies / mL. A negative result  does not preclude SARS-CoV-2 infection and should not be used as the sole basis for treatment or other patient management decisions.  A negative result may occur with improper specimen collection / handling, submission of specimen other than nasopharyngeal swab, presence of viral mutation(s) within the areas targeted by this assay, and inadequate number of viral copies (<250 copies / mL). A negative result must be combined with clinical observations, patient history, and epidemiological information. Fact Sheet for Patients:   BoilerBrush.com.cy Fact Sheet for Healthcare Providers: https://pope.com/ This test is not yet approved or cleared  by the [place] FDA and has been authorized for detection and/or diagnosis of SARS-CoV-2 by FDA under an Emergency Use Authorization (EUA).  This EUA will remain in effect (meaning this test can be used) for the duration of the COVID-19 declaration under Section 564(b)(1) of the Act, 21 U.S.C. section 360bbb-3(b)(1), unless the authorization is terminated or revoked sooner. Performed at [facility], [address] [place], [place] [address]   Urinalysis, Routine w reflex microscopic     Status: Abnormal   Collection Time: 10/13/19  7:04 PM  Result Value Ref Range   Color, Urine YELLOW YELLOW   APPearance HAZY (A) CLEAR   Specific Gravity, Urine 1.010 1.005 - 1.030   pH 5.0 5.0 - 8.0   Glucose, UA NEGATIVE NEGATIVE mg/dL   Hgb urine dipstick SMALL (A) NEGATIVE   Bilirubin Urine NEGATIVE NEGATIVE   Ketones, ur NEGATIVE NEGATIVE mg/dL   Protein, ur NEGATIVE NEGATIVE mg/dL   Nitrite POSITIVE (A) NEGATIVE   Leukocytes,Ua LARGE (A) NEGATIVE   RBC / HPF 6-10 0 - 5 RBC/hpf   WBC, UA 21-50 0 - 5  WBC/hpf   Bacteria, UA FEW (A) NONE SEEN   Squamous Epithelial / LPF 0-5 0 - 5    Comment: Performed at [facility], [address] [place], [place] [address]   CT HEAD WO  CONTRAST  Result Date: 10/13/2019 CLINICAL DATA:  Right leg weakness EXAM: CT HEAD WITHOUT CONTRAST TECHNIQUE: Contiguous axial images were obtained from the base of the skull through the vertex without intravenous contrast. COMPARISON:  07/18/2019 FINDINGS: Brain: There is no acute intracranial hemorrhage, mass effect, or edema. No new loss of gray-white differentiation. There are chronic infarcts including involvement of the left frontal lobe, central white matter, deep gray nuclei, and right cerebellum. This is superimposed on patchy and confluent hypoattenuation in the supratentorial white matter, which probably reflects moderate chronic microvascular ischemic changes. There is no extra-axial fluid collection. There is mild ex vacuo dilatation of the left lateral ventricle. Vascular: There is atherosclerotic calcification at the skull base. Skull: Calvarium is unremarkable. Sinuses/Orbits: Minor mucosal thickening. No significant orbital abnormality. Other: None. IMPRESSION: No acute intracranial hemorrhage, mass effect, or evidence of acute infarction. Chronic/nonemergent findings detailed above. Electronically Signed   By: Guadlupe Spanish M.D.   On: 10/13/2019 12:18   MR BRAIN WO CONTRAST  Result Date: 10/13/2019 CLINICAL DATA:  86 year old female with recent weakness in the right leg and difficulty walking. Subsequent weakness in the right arm. EXAM: MRI HEAD WITHOUT CONTRAST TECHNIQUE: Multiplanar, multiecho pulse sequences of the brain and surrounding structures were obtained without intravenous contrast. COMPARISON:  Head CT earlier today.  Brain MRI 03/21/2018. FINDINGS: Brain: Patchy 11 mm area of restricted diffusion in the left pons with T2 and FLAIR hyperintensity, but no hemorrhage or mass effect. Superimposed subacute on chronic appearing right middle frontal gyrus white matter ischemia (series 3, image 34). Acute and chronic T2 and FLAIR hyperintensity in the area plus cystic white matter  encephalomalacia. Underlying chronic ischemia in the anterior left MCA territory with encephalomalacia including the left insula. Chronic infarcts in the cerebellum a especially the right PICA territory. Chronic additional T2 and FLAIR heterogeneity throughout the deep gray nuclei. Left midbrain Wallerian degeneration. Only occasional chronic micro hemorrhages. No midline shift, mass effect, evidence of mass lesion, ventriculomegaly, extra-axial collection or acute intracranial hemorrhage. Cervicomedullary junction and pituitary are within normal limits. Vascular: Major intracranial vascular flow voids are stable since 2019 with generalized intracranial artery tortuosity. Skull and upper cervical spine: Negative for age visible cervical spine, bone marrow signal. Sinuses/Orbits: Stable and negative. Other: Mastoids remain well pneumatized. Visible internal auditory structures appear normal. Scalp and face soft tissues appear negative. IMPRESSION: 1. Acute lacunar infarct in the left pons. No associated hemorrhage or mass effect. 2. Subacute white matter lacunar infarct in the right centrum semiovale, likely synchronous small vessel disease (see #3). 3. Underlying advanced chronic ischemic disease otherwise stable since 03/21/2018. Electronically Signed   By: Odessa Fleming M.D.   On: 10/13/2019 18:32    Assessment: 86 y.o. female presenting with a 6 day history of right sided weakness. Initial symptoms last Tuesday with acute worsening on Saturday.  1. Exam reveals right sided weakness.  2. MRI brain reveals an acute lacunar infarct in the left pons, as well as a subacute white matter lacunar infarct in the right centrum 3.Also noted on MRI: Advanced chronic ischemic disease, diffuse atrophy, chronic left frontal lobe medium-sized ischemic infarction and chronic right cerebellar medium-sized ischemic infarctions.  4. CTA of head and neck: Negative for emergent large vessel occlusion.  Ectatic carotid arteries with  mild for age atherosclerosis. Positive for severe stenoses of the Left PCA distal P2 and PCA bifurcation; mild for age posterior circulation atherosclerosis otherwise. Dominant Left Vertebral Artery. Moderate irregularity and stenosis of the Left MCA M1 is likely related to the chronic Left MCA territory infarct. 5. Stroke Risk Factors - Prior stroke, atrial fibrillation, DM2, HTN, HLD and history of DVT 6. INR is therapeutic at 2.3 7. UTI.   Recommendations: 1. HgbA1c, fasting lipid panel 2. TTE 3. PT consult, OT consult, Speech consult 4. Continue Coumadin via NGT 5. Telemetry monitoring 6. BP management. Out of permissive HTN time window.     @Electronically  signed: Dr. Caryl PinaEric [email or web address]@  10/13/2019, 7:57 PM

## 2019-10-13 NOTE — H&P (Addendum)
History and Physical    SWAN FAIRFAX AVW:[id number] DOB: 03-28-1925 DOA: 10/13/2019  PCP: Unk Pinto, MD  Patient coming from: Home.  Chief Complaint: Right-sided weakness.  HPI: Lisa Hopkins is a 86 y.o. female with history of CVA A. fib hypertension diabetes mellitus was brought to the ER after patient was having persistent right-sided weakness.  Per patient's son patient symptoms started on Oct 07, 2019 about 6 days ago when patient had weakness of the right lower extremity and was taken to her primary care physician and was prescribed prednisone for possible pinched nerve.  Following which patient started developing weakness of the right upper extremity difficult to grip things and this has progressively worsened was brought to the ER.  Patient denies any visual symptoms or difficulty swallowing or speaking.  ED Course: In the ER MRI of the brain shows acute left pontine infarct and also infarct in the right semiovale.  On-call neurology was consulted.  Patient admitted for further work-up of acute stroke but on admission patient has minimal movement in the right upper and lower extremity.  Left upper and lower extremity patient has good strength.  Labs reveal therapeutic INR of 2.3 with EKG showing A. fib flutter.  Rate controlled.  WBC was 13.1 Covid test was negative.  UA shows features concerning for UTI.  Potassium was 3.2 for which IV potassium was given.  Patient failed swallow.  Review of Systems: As per HPI, rest all negative.   Past Medical History:  Diagnosis Date  . Anxiety   . Aphasia as late effect of cerebrovascular accident   . Atrial fibrillation ([place])   . CKD (chronic kidney disease), stage II   . CVA (cerebral infarction)   . Diabetes mellitus type II   . DVT, lower extremity, distal, acute, right ([place]) 03/25/2018   Right peroneal vein 03/22/2018  . GERD (gastroesophageal reflux disease)   . Hiatal hernia   . HTN (hypertension)   .  Hyperlipidemia   . IBS (irritable bowel syndrome)   . Stroke ([place])   . Vitamin D deficiency     Past Surgical History:  Procedure Laterality Date  . CATARACT EXTRACTION, BILATERAL    . CHOLECYSTECTOMY    . COLONOSCOPY  2005  . ESOPHAGOGASTRODUODENOSCOPY (EGD) WITH PROPOFOL N/A 11/28/2017   Procedure: ESOPHAGOGASTRODUODENOSCOPY (EGD) WITH PROPOFOL;  Surgeon: Wonda Horner, MD;  [facility] END[facility]COPY;  Service: Endoscopy;  Laterality: N/A;  . TONSILLECTOMY    . UPPER GASTROINTESTINAL ENDOSCOPY  2005     reports that she has never smoked. She has never used smokeless tobacco. She reports that she does not drink alcohol or use drugs.  Allergies  Allergen Reactions  . Ace Inhibitors Cough  . Fosamax [Alendronate Sodium] Other (See Comments)    Heart burn  . Norvasc [Amlodipine Besylate] Other (See Comments)    edema  . Zocor [Simvastatin] Other (See Comments)    Elevated CPK    Family History  Problem Relation Age of Onset  . Diabetes Mother   . Stroke Mother   . Hypertension Brother   . Heart disease Brother   . Arthritis Sister        Rhematoid  . Arthritis Sister        Rhematoid    Prior to Admission medications   Medication Sig Start Date End Date Taking? Authorizing Provider  Cholecalciferol (VITAMIN D3) 125 MCG (5000 UT) TABS Take 5,000-10,000 Units by mouth See admin instructions. Take two tablets (10,000 units) by  mouth on Monday, Wednesday, Friday after supper, take one tablet (5,000 units) on Sunday, Tuesday, Thursday, Saturday after supper   Yes [provider]  CINNAMON PO Take 1,000 mg by mouth daily after breakfast.    Yes [provider]  dicyclomine (BENTYL) 20 MG tablet Take 1 tablet 3 x day if needed for nausea, bloating, cramping or diarrhea 05/16/18  Yes Lucky Cowboy, MD  furosemide (LASIX) 20 MG tablet Take 1 tablet Daily  for BP & Fluid Retention / Ankle Swelling. Patient taking differently: Take 20 mg by mouth daily. for BP &  Fluid Retention / Ankle Swelling. 04/14/19  Yes Lucky Cowboy, MD  loperamide (IMODIUM) 2 MG capsule TAKE ONE CAPSULE IN THE P.M. Patient taking differently: Take 2 mg by mouth daily.  09/10/19  Yes Judd Gaudier, NP  losartan (COZAAR) 50 MG tablet Take 1 tablet Daily for BP Patient taking differently: Take 50 mg by mouth daily. for BP 07/04/19  Yes Lucky Cowboy, MD  metoprolol tartrate (LOPRESSOR) 25 MG tablet TAKE (1/2) TABLET TWICE DAILY. Patient taking differently: Take 12.5 mg by mouth 2 (two) times daily. TAKE (1/2) TABLET TWICE DAILY. 09/10/19  Yes Judd Gaudier, NP  ondansetron (ZOFRAN ODT) 4 MG disintegrating tablet Dissolve 1 tablet under tongue every 6 to 8 hours if needed for nausea & / or vomitting 04/10/18  Yes Lucky Cowboy, MD  pantoprazole (PROTONIX) 40 MG tablet Take 1 tablet Daily for Heart burn & Indigestion Patient taking differently: Take 40 mg by mouth daily after breakfast. for Heart burn & Indigestion 02/14/19  Yes Lucky Cowboy, MD  predniSONE (DELTASONE) 20 MG tablet 2 tablets daily for 3 days, 1 tablet daily for 4 days. Take with food or milk. Patient taking differently: Take 20-40 mg by mouth as directed. 2 tablets daily for 3 days, 1 tablet daily for 4 days. Take with food or milk. 10/09/19  Yes Judd Gaudier, NP  QUEtiapine (SEROQUEL) 25 MG tablet TAKE 1/2 TABLET AT BEDTIME. Patient taking differently: Take 12.5 mg by mouth at bedtime. Take 1/2 tablet at Bedtime 09/29/19  Yes Lucky Cowboy, MD  warfarin (COUMADIN) 2 MG tablet TAKE 1 TABLET EACH DAY EXCEPT 1/2 TABLET ON MONDAYS AND FRIDAYS OR AS DIRECTED BY ANTICOAGULATION CLINIC. 10/01/19  Yes Lars Masson, MD  acyclovir (ZOVIRAX) 400 MG tablet Take 1 pill 3 times daily with food and 2 pills at night for 7 days. Patient not taking: Reported on 10/13/2019 09/30/19   Elder Negus, NP  feeding supplement, ENSURE ENLIVE, (ENSURE ENLIVE) LIQD Take 237 mLs by mouth 2 (two) times daily between  meals. Patient not taking: Reported on 10/13/2019 06/20/18   Calvert Cantor, MD  potassium chloride (KLOR-CON) 10 MEQ tablet Take 1 tablet daily for Potassium Patient taking differently: Take 10 mEq by mouth daily.  07/08/19   Lucky Cowboy, MD    Physical Exam: Constitutional: Moderately built and nourished. Vitals:   10/13/19 2100 10/13/19 2130 10/13/19 2200 10/13/19 2215  BP: (!) 145/87 (!) 166/98 (!) 149/95   Pulse: 72 74 71 63  Resp: 14 (!) 23 (!) 22 18  Temp:      TempSrc:      SpO2: 96% 93%  96%   Eyes: Anicteric no pallor. ENMT: No discharge from the ears eyes nose or mouth. Neck: No mass felt.  No neck rigidity. Respiratory: No rhonchi or crepitations. Cardiovascular: S1-S2 heard. Abdomen: Soft nontender bowel sounds present. Musculoskeletal: No edema. Skin: No rash. Neurologic: Alert awake oriented place and person.  Right upper and lower extremities around 1 x 5 in left upper and lower extremities 5 x 5.  No obvious facial asymmetry pupils equal and reactive to light. Psychiatric: Appears normal.  Normal affect.   Labs on Admission: I have personally reviewed following labs and imaging studies  CBC: Recent Labs  Lab 10/09/19 1632 10/13/19 1109  WBC 7.5 13.1*  NEUTROABS 3,765 7.2  HGB 14.9 15.0  HCT 45.3* 47.7*  MCV 91.1 96.4  PLT 251 264   Basic Metabolic Panel: Recent Labs  Lab 10/09/19 1632 10/13/19 1109  NA 141 137  K 5.4* 3.2*  CL 101 103  CO2 31 22  GLUCOSE 129* 131*  BUN 15 16  CREATININE 0.94* 0.98  CALCIUM 10.8* 9.3   GFR: CrCl cannot be calculated (Unknown ideal weight.). Liver Function Tests: Recent Labs  Lab 10/09/19 1632 10/13/19 1109  AST 19 26  ALT 16 24  ALKPHOS  --  102  BILITOT 0.7 0.6  PROT 7.6 7.2  ALBUMIN  --  3.9   No results for input(s): LIPASE, AMYLASE in the last 168 hours. No results for input(s): AMMONIA in the last 168 hours. Coagulation Profile: Recent Labs  Lab 10/13/19 1109  INR 2.3*   Cardiac  Enzymes: No results for input(s): CKTOTAL, CKMB, CKMBINDEX, TROPONINI in the last 168 hours. BNP (last 3 results) No results for input(s): PROBNP in the last 8760 hours. HbA1C: No results for input(s): HGBA1C in the last 72 hours. CBG: Recent Labs  Lab 10/13/19 2152  GLUCAP 162*   Lipid Profile: No results for input(s): CHOL, HDL, LDLCALC, TRIG, CHOLHDL, LDLDIRECT in the last 72 hours. Thyroid Function Tests: No results for input(s): TSH, T4TOTAL, FREET4, T3FREE, THYROIDAB in the last 72 hours. Anemia Panel: No results for input(s): VITAMINB12, FOLATE, FERRITIN, TIBC, IRON, RETICCTPCT in the last 72 hours. Urine analysis:    Component Value Date/Time   COLORURINE YELLOW 10/13/2019 1904   APPEARANCEUR HAZY (A) 10/13/2019 1904   LABSPEC 1.010 10/13/2019 1904   PHURINE 5.0 10/13/2019 1904   GLUCOSEU NEGATIVE 10/13/2019 1904   HGBUR SMALL (A) 10/13/2019 1904   BILIRUBINUR NEGATIVE 10/13/2019 1904   KETONESUR NEGATIVE 10/13/2019 1904   PROTEINUR NEGATIVE 10/13/2019 1904   UROBILINOGEN 1.0 04/02/2015 1244   NITRITE POSITIVE (A) 10/13/2019 1904   LEUKOCYTESUR LARGE (A) 10/13/2019 1904   Sepsis Labs: @LABRCNTIP (procalcitonin:4,lacticidven:4) ) Recent Results (from the past 240 hour(s))  Urine Culture     Status: None   Collection Time: 10/09/19  4:32 PM  Result Value Ref Range Status   MICRO NUMBER: [id number]  Final   SPECIMEN QUALITY: Adequate  Final   Sample Source URINE  Final   STATUS: FINAL  Final   Result:   Final    Growth of mixed flora was isolated, suggesting probable contamination. No further testing will be performed. If clinically indicated, recollection using a method to minimize contamination, with prompt transfer to Urine Culture Transport Tube, is  recommended.   SARS Coronavirus 2 by RT PCR (hospital order, performed in [facility]) Nasopharyngeal Nasopharyngeal Swab     Status: None   Collection Time: 10/13/19 12:30 PM   Specimen:  Nasopharyngeal Swab  Result Value Ref Range Status   SARS Coronavirus 2 NEGATIVE NEGATIVE Final    Comment: (NOTE) SARS-CoV-2 target nucleic acids are NOT DETECTED. The SARS-CoV-2 RNA is generally detectable in upper and lower respiratory specimens during the acute phase of infection. The lowest concentration of SARS-CoV-2 viral copies this assay  can detect is 250 copies / mL. A negative result does not preclude SARS-CoV-2 infection and should not be used as the sole basis for treatment or other patient management decisions.  A negative result may occur with improper specimen collection / handling, submission of specimen other than nasopharyngeal swab, presence of viral mutation(s) within the areas targeted by this assay, and inadequate number of viral copies (<250 copies / mL). A negative result must be combined with clinical observations, patient history, and epidemiological information. Fact Sheet for Patients:   BoilerBrush.com.cy Fact Sheet for Healthcare Providers: https://pope.com/ This test is not yet approved or cleared  by the [place] FDA and has been authorized for detection and/or diagnosis of SARS-CoV-2 by FDA under an Emergency Use Authorization (EUA).  This EUA will remain in effect (meaning this test can be used) for the duration of the COVID-19 declaration under Section 564(b)(1) of the Act, 21 U.S.C. section 360bbb-3(b)(1), unless the authorization is terminated or revoked sooner. Performed at [facility], [address], [place], [place] [address]      Radiological Exams on Admission: CT HEAD WO CONTRAST  Result Date: 10/13/2019 CLINICAL DATA:  Right leg weakness EXAM: CT HEAD WITHOUT CONTRAST TECHNIQUE: Contiguous axial images were obtained from the base of the skull through the vertex without intravenous contrast. COMPARISON:  07/18/2019 FINDINGS: Brain: There is no acute intracranial hemorrhage, mass  effect, or edema. No new loss of gray-white differentiation. There are chronic infarcts including involvement of the left frontal lobe, central white matter, deep gray nuclei, and right cerebellum. This is superimposed on patchy and confluent hypoattenuation in the supratentorial white matter, which probably reflects moderate chronic microvascular ischemic changes. There is no extra-axial fluid collection. There is mild ex vacuo dilatation of the left lateral ventricle. Vascular: There is atherosclerotic calcification at the skull base. Skull: Calvarium is unremarkable. Sinuses/Orbits: Minor mucosal thickening. No significant orbital abnormality. Other: None. IMPRESSION: No acute intracranial hemorrhage, mass effect, or evidence of acute infarction. Chronic/nonemergent findings detailed above. Electronically Signed   By: Guadlupe Spanish M.D.   On: 10/13/2019 12:18   MR BRAIN WO CONTRAST  Result Date: 10/13/2019 CLINICAL DATA:  86 year old female with recent weakness in the right leg and difficulty walking. Subsequent weakness in the right arm. EXAM: MRI HEAD WITHOUT CONTRAST TECHNIQUE: Multiplanar, multiecho pulse sequences of the brain and surrounding structures were obtained without intravenous contrast. COMPARISON:  Head CT earlier today.  Brain MRI 03/21/2018. FINDINGS: Brain: Patchy 11 mm area of restricted diffusion in the left pons with T2 and FLAIR hyperintensity, but no hemorrhage or mass effect. Superimposed subacute on chronic appearing right middle frontal gyrus white matter ischemia (series 3, image 34). Acute and chronic T2 and FLAIR hyperintensity in the area plus cystic white matter encephalomalacia. Underlying chronic ischemia in the anterior left MCA territory with encephalomalacia including the left insula. Chronic infarcts in the cerebellum a especially the right PICA territory. Chronic additional T2 and FLAIR heterogeneity throughout the deep gray nuclei. Left midbrain Wallerian degeneration.  Only occasional chronic micro hemorrhages. No midline shift, mass effect, evidence of mass lesion, ventriculomegaly, extra-axial collection or acute intracranial hemorrhage. Cervicomedullary junction and pituitary are within normal limits. Vascular: Major intracranial vascular flow voids are stable since 2019 with generalized intracranial artery tortuosity. Skull and upper cervical spine: Negative for age visible cervical spine, bone marrow signal. Sinuses/Orbits: Stable and negative. Other: Mastoids remain well pneumatized. Visible internal auditory structures appear normal. Scalp and face soft tissues appear negative. IMPRESSION:  1. Acute lacunar infarct in the left pons. No associated hemorrhage or mass effect. 2. Subacute white matter lacunar infarct in the right centrum semiovale, likely synchronous small vessel disease (see #3). 3. Underlying advanced chronic ischemic disease otherwise stable since 03/21/2018. Electronically Signed   By: Odessa Fleming M.D.   On: 10/13/2019 18:32    EKG: Independently reviewed.  A. fib flutter rate controlled.  Assessment/Plan Principal Problem:   Acute CVA (cerebrovascular accident) [facility]) Active Problems:   Essential hypertension   Atrial fibrillation (HCC)   Diet-controlled type 2 diabetes mellitus (HCC)   CKD stage 2 due to type 2 diabetes mellitus (HCC)    1. Acute CVA -discussed with on-call neurologist Dr. Otelia Limes.  Patient failed swallow.  Speech therapy has been consulted.  Neurochecks.  Check hemoglobin A1c lipid panel CT angiogram of the head and neck physical therapy consult.  Patient is on Coumadin presently.  If patient does not pass swallow with speech therapist and will need NG tube in place on Coumadin. 2. A. fib rate controlled presently.  Patient has failed swallow and patient is on Coumadin which will need to dose through NG tube if patient does not pass swallow. 3. Hypertension -allow for permissive hypertension as needed IV hydralazine for  systolic blood pressure more than 220 and diastolic more than 120. 4. Diabetes mellitus type 2 on diet check hemoglobin A1c presently on sliding scale coverage. 5. Possible UTI on ceftriaxone check urine cultures. 6. Mild hypokalemia replace and recheck. 7. Chronic kidney disease stage III appears to be at baseline. 8. Mild leukocytosis could be reactionary or could be from UTI.  Note that most of patient's oral medications are on hold due to patient did not pass swallow.  Which needs to be started once patient passes swallow.  Given that patient has acute CVA with right-sided hemiplegia will need close monitoring for any further worsening in inpatient status.   DVT prophylaxis: On full dose Coumadin.  See #1. Code Status: DNR as confirmed with patient. Family Communication: Discussed with patient. Disposition Plan: May need rehab. Consults called: Neurology. Admission status: Inpatient.   Eduard Clos MD [facility] Pager [phone].  If 7PM-7AM, please contact night-coverage [email or web address] Password [email or web address]  10/13/2019, 11:01 PM

## 2019-10-13 NOTE — ED Provider Notes (Signed)
3:22 PM BP (!) 152/86   Pulse (!) 59   Temp (!) 97.4 F (36.3 C) (Oral)   Resp (!) 23   SpO2 96%  Patient here for neuro complaints Waiting on MRI Suspect subacute stroke. F/u with Wilford Corner on results If negative pt/ot   Patient MRI reviewed by me. Shows acute infarct. Per nurse the patient was unable to pass the swallow screen because she cannot "gulp" the water.  Her son states that she has known dysphagia and hiatal hernia and cannot gulp down fluid at baseline, however can sip fluids.  Given the fact the the patient will not have OT swallow screen until tomorrow and hasn't eaten since early this morning I allowed her a small amount of apple juice and saltine crackers which had no difficulty swallowing if she sipped slowly.  Her son was also extremely concerned with her eating/ drinking something. Patient will be admitted to Dr. Toniann Fail.  .Critical Care Performed by: Arthor Captain, PA-C Authorized by: Arthor Captain, PA-C   Critical care provider statement:    Critical care time (minutes):  45   Critical care time was exclusive of:  Separately billable procedures and treating other patients   Critical care was necessary to treat or prevent imminent or life-threatening deterioration of the following conditions:  CNS failure or compromise   Critical care was time spent personally by me on the following activities:  Discussions with consultants, evaluation of patient's response to treatment, examination of patient, ordering and performing treatments and interventions, ordering and review of laboratory studies, ordering and review of radiographic studies, pulse oximetry, re-evaluation of patient's condition, obtaining history from patient or surrogate and review of old charts   Clinical Course as of Oct 13 1632  Mon Oct 13, 2019  1254 Dr. Wilford Corner   [BM]  1943 Nitrite(!): POSITIVE [AH]  1944 Leukocytes,Ua(!): LARGE [AH]    Clinical Course User Index [AH] Arthor Captain,  PA-C [BM] Raul Del, PA-C 10/14/19 1634    Linwood Dibbles, MD 10/15/19 [phone]

## 2019-10-14 ENCOUNTER — Inpatient Hospital Stay (HOSPITAL_COMMUNITY): Payer: Medicare Other

## 2019-10-14 DIAGNOSIS — I4891 Unspecified atrial fibrillation: Secondary | ICD-10-CM

## 2019-10-14 DIAGNOSIS — I361 Nonrheumatic tricuspid (valve) insufficiency: Secondary | ICD-10-CM

## 2019-10-14 DIAGNOSIS — I340 Nonrheumatic mitral (valve) insufficiency: Secondary | ICD-10-CM

## 2019-10-14 LAB — COMPREHENSIVE METABOLIC PANEL
ALT: 22 U/L (ref 0–44)
AST: 22 U/L (ref 15–41)
Albumin: 3.4 g/dL — ABNORMAL LOW (ref 3.5–5.0)
Alkaline Phosphatase: 100 U/L (ref 38–126)
Anion gap: 8 (ref 5–15)
BUN: 15 mg/dL (ref 8–23)
CO2: 28 mmol/L (ref 22–32)
Calcium: 9.3 mg/dL (ref 8.9–10.3)
Chloride: 103 mmol/L (ref 98–111)
Creatinine, Ser: 0.87 mg/dL (ref 0.44–1.00)
GFR calc Af Amer: >60 mL/min (ref >60)
GFR calc non Af Amer: 58 mL/min — ABNORMAL LOW (ref >60)
Glucose, Bld: 122 mg/dL — ABNORMAL HIGH (ref 70–99)
Potassium: 3.8 mmol/L (ref 3.5–5.1)
Sodium: 139 mmol/L (ref 135–145)
Total Bilirubin: 0.8 mg/dL (ref 0.3–1.2)
Total Protein: 6.6 g/dL (ref 6.5–8.1)

## 2019-10-14 LAB — PROTIME-INR
INR: 2.1 — ABNORMAL HIGH (ref 0.8–1.2)
Prothrombin Time: 22.6 seconds — ABNORMAL HIGH (ref 11.4–15.2)

## 2019-10-14 LAB — GLUCOSE, CAPILLARY
Glucose-Capillary: 105 mg/dL — ABNORMAL HIGH (ref 70–99)
Glucose-Capillary: 133 mg/dL — ABNORMAL HIGH (ref 70–99)
Glucose-Capillary: 147 mg/dL — ABNORMAL HIGH (ref 70–99)
Glucose-Capillary: 170 mg/dL — ABNORMAL HIGH (ref 70–99)
Glucose-Capillary: 94 mg/dL (ref 70–99)
Glucose-Capillary: 99 mg/dL (ref 70–99)

## 2019-10-14 LAB — LIPID PANEL
Cholesterol: 217 mg/dL — ABNORMAL HIGH (ref 0–200)
HDL: 54 mg/dL (ref >40)
LDL Cholesterol: 124 mg/dL — ABNORMAL HIGH (ref 0–99)
Total CHOL/HDL Ratio: 4 RATIO
Triglycerides: 196 mg/dL — ABNORMAL HIGH (ref <150)
VLDL: 39 mg/dL (ref 0–40)

## 2019-10-14 LAB — CBC WITH DIFFERENTIAL/PLATELET
Abs Immature Granulocytes: 0.04 10*3/uL (ref 0.00–0.07)
Basophils Absolute: 0 10*3/uL (ref 0.0–0.1)
Basophils Relative: 0 %
Eosinophils Absolute: 0 10*3/uL (ref 0.0–0.5)
Eosinophils Relative: 0 %
HCT: 44.8 % (ref 36.0–46.0)
Hemoglobin: 14.5 g/dL (ref 12.0–15.0)
Immature Granulocytes: 0 %
Lymphocytes Relative: 38 %
Lymphs Abs: 3.6 10*3/uL (ref 0.7–4.0)
MCH: 30.3 pg (ref 26.0–34.0)
MCHC: 32.4 g/dL (ref 30.0–36.0)
MCV: 93.5 fL (ref 80.0–100.0)
Monocytes Absolute: 0.7 10*3/uL (ref 0.1–1.0)
Monocytes Relative: 8 %
Neutro Abs: 5.1 10*3/uL (ref 1.7–7.7)
Neutrophils Relative %: 54 %
Platelets: 273 10*3/uL (ref 150–400)
RBC: 4.79 MIL/uL (ref 3.87–5.11)
RDW: 13.9 % (ref 11.5–15.5)
WBC: 9.5 10*3/uL (ref 4.0–10.5)
nRBC: 0 % (ref 0.0–0.2)

## 2019-10-14 LAB — CBG MONITORING, ED: Glucose-Capillary: 110 mg/dL — ABNORMAL HIGH (ref 70–99)

## 2019-10-14 LAB — ECHOCARDIOGRAM COMPLETE
Height: 64 in
Weight: 1872 oz

## 2019-10-14 MED ORDER — METOPROLOL TARTRATE 12.5 MG HALF TABLET
12.5000 mg | ORAL_TABLET | Freq: Two times a day (BID) | ORAL | Status: DC
  Administered 2019-10-14 – 2019-10-17 (×6): 12.5 mg via ORAL
  Filled 2019-10-14 (×6): qty 1

## 2019-10-14 MED ORDER — HYDRALAZINE HCL 20 MG/ML IJ SOLN: 10.0000 mg | INTRAMUSCULAR | Status: DC | PRN

## 2019-10-14 MED ORDER — WARFARIN SODIUM 2 MG PO TABS
2.0000 mg | ORAL_TABLET | Freq: Once | ORAL | Status: AC
  Administered 2019-10-14: 2 mg via ORAL
  Filled 2019-10-14: qty 1

## 2019-10-14 MED ORDER — RESOURCE THICKENUP CLEAR PO POWD
ORAL | Status: DC | PRN
  Filled 2019-10-14 (×2): qty 125

## 2019-10-14 MED ORDER — SODIUM CHLORIDE 0.9 % IV SOLN: INTRAVENOUS | Status: AC

## 2019-10-14 MED ORDER — ATORVASTATIN CALCIUM 10 MG PO TABS
20.0000 mg | ORAL_TABLET | Freq: Every day | ORAL | Status: DC
  Administered 2019-10-14 – 2019-10-17 (×4): 20 mg via ORAL
  Filled 2019-10-14 (×4): qty 2

## 2019-10-14 MED ORDER — SODIUM CHLORIDE 0.9 % IV SOLN
1.0000 g | INTRAVENOUS | Status: DC
  Administered 2019-10-14 – 2019-10-17 (×4): 1 g via INTRAVENOUS
  Filled 2019-10-14 (×4): qty 10

## 2019-10-14 MED ORDER — ORAL CARE MOUTH RINSE
15.0000 mL | Freq: Two times a day (BID) | OROMUCOSAL | Status: DC
  Administered 2019-10-14 – 2019-10-17 (×7): 15 mL via OROMUCOSAL

## 2019-10-14 MED ORDER — WARFARIN - PHARMACIST DOSING INPATIENT: Freq: Every day | Status: DC

## 2019-10-14 MED ORDER — ATORVASTATIN CALCIUM 80 MG PO TABS: 80.0000 mg | ORAL_TABLET | Freq: Every day | ORAL | Status: DC

## 2019-10-14 MED ORDER — ASPIRIN EC 81 MG PO TBEC
81.0000 mg | DELAYED_RELEASE_TABLET | Freq: Every day | ORAL | Status: DC
  Administered 2019-10-14 – 2019-10-17 (×4): 81 mg via ORAL
  Filled 2019-10-14 (×4): qty 1

## 2019-10-14 NOTE — Evaluation (Signed)
Speech Language Pathology Evaluation Patient Details Name: Lisa Hopkins MRN: [id number] DOB: 1925/02/01 Today's Date: 10/14/2019 Time: 6948-5462 SLP Time Calculation (min) (ACUTE ONLY): 12 min  Problem List:  Patient Active Problem List   Diagnosis Date Noted  . Acute CVA (cerebrovascular accident) ([place]) 10/13/2019  . Vascular dementia without behavioral disturbance ([place]) 12/31/2018  . Protein-calorie malnutrition, severe 06/20/2018  . Encounter for general adult medical examination with abnormal findings 12/21/2017  . Hiatal hernia 12/19/2017  . CKD stage 2 due to type 2 diabetes mellitus ([place]) 08/02/2017  . Diet-controlled type 2 diabetes mellitus ([place]) 12/25/2016  . Atherosclerosis of aorta ([place]) 12/03/2015  . GERD 09/25/2014  . Diastolic CHF ([place]) 70/35/0093  . Hyperlipidemia associated with type 2 diabetes mellitus ([place])   . Vitamin D deficiency   . Anxiety state 03/15/2010  . Essential hypertension 03/15/2010  . Atrial fibrillation ([place]) 03/15/2010  . APHASIA DUE TO CEREBROVASCULAR DISEASE 03/15/2010  . History of CVA (cerebrovascular accident) 03/07/2010   Past Medical History:  Past Medical History:  Diagnosis Date  . Anxiety   . Aphasia as late effect of cerebrovascular accident   . Atrial fibrillation ([place])   . CKD (chronic kidney disease), stage II   . CVA (cerebral infarction)   . Diabetes mellitus type II   . DVT, lower extremity, distal, acute, right ([place]) 03/25/2018   Right peroneal vein 03/22/2018  . GERD (gastroesophageal reflux disease)   . Hiatal hernia   . HTN (hypertension)   . Hyperlipidemia   . IBS (irritable bowel syndrome)   . Stroke ([place])   . Vitamin D deficiency    Past Surgical History:  Past Surgical History:  Procedure Laterality Date  . CATARACT EXTRACTION, BILATERAL    . CHOLECYSTECTOMY    . COLONOSCOPY  2005  . ESOPHAGOGASTRODUODENOSCOPY (EGD) WITH PROPOFOL N/A 11/28/2017   Procedure: ESOPHAGOGASTRODUODENOSCOPY (EGD) WITH  PROPOFOL;  Surgeon: Wonda Horner, MD;  Location: [facility] ENDOSCOPY;  Service: Endoscopy;  Laterality: N/A;  . TONSILLECTOMY    . UPPER GASTROINTESTINAL ENDOSCOPY  2005   HPI:  Pt is a 86 yo female presenting after six days of R sided weakness. MRI revealed an acute lacunar infarct in the L pons. Pt had previous MBS in 2011, recommending nectar thick liquids given aspiration of thin liquids. More recent BSE in 2019 reported normal appearing oropharyngeal swallow but with more symptomatic esophageal dysphagia. PMH includes: large HH, GERD, esophageal stricture, CVA, aphasia, IBS, HLD, HTN, DMII, CKD, Afib, anxiety   Assessment / Plan / Recommendation Clinical Impression  Pt has a difficult time recalling recent events and is disoriented to time, although she acknowledges that she was able to tell other providers what month it was earlier in the morning. She has reduced intellectual awareness and ability to describe personal hx, but she can identify her LUE/LLL weakness. Pt has a reported h/o aphasia that is suspected to be at baseline also considering the location of her acute infarct, but her speech is mildly slurred and upon questioning she believes that this is an acute change. This impacts intelligibility primarily at the phrase to sentence level. Pt will benefit from additional SLP f/u to address dysarthria and potentially cognition as well if we can get a better sense of baseline level of function.    SLP Assessment  SLP Recommendation/Assessment: Patient needs continued Speech Lanaguage Pathology Services SLP Visit Diagnosis: Dysarthria and anarthria (R47.1)    Follow Up Recommendations  (tba)    Frequency and Duration min 2x/week  2 weeks      SLP Evaluation Cognition  Overall Cognitive Status: No family/caregiver present to determine baseline cognitive functioning Arousal/Alertness: Awake/alert Orientation Level: Oriented to place;Oriented to person;Oriented to situation;Disoriented to  time Attention: Sustained Sustained Attention: Appears intact Memory: Impaired Memory Impairment: Decreased recall of new information Awareness: Impaired Awareness Impairment: Intellectual impairment       Comprehension  Auditory Comprehension Overall Auditory Comprehension: Impaired Commands: Impaired One Step Basic Commands: 75-100% accurate Conversation: Simple    Expression Expression Primary Mode of Expression: Verbal Verbal Expression Overall Verbal Expression: Impaired at baseline   Oral / Motor  Oral Motor/Sensory Function Overall Oral Motor/Sensory Function: Mild impairment Facial ROM: Within Functional Limits Facial Symmetry: Within Functional Limits Facial Strength: Within Functional Limits Lingual ROM: Reduced left;Suspected CN XII (hypoglossal) dysfunction Lingual Symmetry: Abnormal symmetry right Lingual Strength: Reduced Velum: Within Functional Limits Mandible: Within Functional Limits Motor Speech Overall Motor Speech: Impaired Respiration: Within functional limits Phonation: Low vocal intensity Resonance: Within functional limits Articulation: Impaired Level of Impairment: Sentence Intelligibility: Intelligibility reduced Word: 75-100% accurate Phrase: 75-100% accurate Sentence: 50-74% accurate   GO                     Mahala Menghini., M.A. CCC-SLP Acute Rehabilitation Services Pager [phone] Office [phone]  10/14/2019, 9:48 AM

## 2019-10-14 NOTE — Progress Notes (Signed)
STROKE TEAM PROGRESS NOTE   INTERVAL HISTORY Son at bedside. Pt lying in bed, still has right sided weakness, but awake alert and fully orientated. MRI showed left pontine infarct. INR 2.3->2.1. as per son, pt compliant with coumadin and INR has been stable. As per record, she was on zocor but stopped due to elevated CPK. However, pt and son can not remember this. But agree to try low dose lipitor for stroke prevention. Discussed with ASA 81 added on top of coumadin for stroke prevention, son is in agreement.   Vitals:   10/14/19 0550 10/14/19 0631 10/14/19 0817 10/14/19 1137  BP: (!) 179/94  (!) 168/97 (!) 145/79  Pulse: 71  63 71  Resp: 16  20 17   Temp: 97.9 F (36.6 C)  (!) 97.4 F (36.3 C) 98 F (36.7 C)  TempSrc: Oral  Oral Oral  SpO2:   98% 98%  Weight:  53.1 kg    Height:  5\' 4"  (1.626 m)      CBC:  Recent Labs  Lab 10/13/19 1109 10/14/19 0548  WBC 13.1* 9.5  NEUTROABS 7.2 5.1  HGB 15.0 14.5  HCT 47.7* 44.8  MCV 96.4 93.5  PLT 264 273    Basic Metabolic Panel:  Recent Labs  Lab 10/13/19 1109 10/14/19 0548  NA 137 139  K 3.2* 3.8  CL 103 103  CO2 22 28  GLUCOSE 131* 122*  BUN 16 15  CREATININE 0.98 0.87  CALCIUM 9.3 9.3   Lipid Panel:     Component Value Date/Time   CHOL 217 (H) 10/14/2019 0548   TRIG 196 (H) 10/14/2019 0548   HDL 54 10/14/2019 0548   CHOLHDL 4.0 10/14/2019 0548   VLDL 39 10/14/2019 0548   LDLCALC 124 (H) 10/14/2019 0548   LDLCALC 112 (H) 07/15/2019 1105   HgbA1c:  Lab Results  Component Value Date   HGBA1C 6.2 (H) 07/15/2019   Urine Drug Screen: No results found for: LABOPIA, COCAINSCRNUR, LABBENZ, AMPHETMU, THCU, LABBARB  Alcohol Level No results found for: ETH  IMAGING past 24 hours CT ANGIO HEAD W OR WO CONTRAST  Result Date: 10/13/2019 CLINICAL DATA:  86 year old female with chronic infarcts. Small acute infarcts in the left pons and right frontal lobe white matter on MRI earlier today. Recent right extremity weakness.  EXAM: CT ANGIOGRAPHY HEAD AND NECK TECHNIQUE: Multidetector CT imaging of the head and neck was performed using the standard protocol during bolus administration of intravenous contrast. Multiplanar CT image reconstructions and MIPs were obtained to evaluate the vascular anatomy. Carotid stenosis measurements (when applicable) are obtained utilizing NASCET criteria, using the distal internal carotid diameter as the denominator. CONTRAST:  80mL OMNIPAQUE IOHEXOL 350 MG/ML SOLN COMPARISON:  Brain MRI and head CT earlier today. FINDINGS: CT HEAD Brain: Subtle hypodensity now in the left pons. No acute intracranial hemorrhage identified. No midline shift, mass effect, or evidence of intracranial mass lesion. Otherwise stable non contrast CT appearance of the brain from earlier today. Calvarium and skull base: No acute osseous abnormality identified. Hyperostosis of the calvarium. Paranasal sinuses: Visualized paranasal sinuses and mastoids are stable and well pneumatized. Orbits: No acute orbit or scalp soft tissue findings. CTA NECK Skeleton: Some carious dentition. Mild for age cervical spine degeneration. No acute osseous abnormality identified. Upper chest: No superior mediastinal lymphadenopathy. Partially visible mild chronic architectural distortion in the left lung, stable from 03/22/2018 CTA chest. Other neck: Benign appearing multinodular thyroid goiter, no follow-up indicated. (Ref: J Am Coll Radiol. 2015 Feb;12(2):  143-50).Otherwise negative neck soft tissues. Aortic arch: Mild for age arch atherosclerosis. Three vessel arch configuration. Right carotid system: Mild soft plaque in the brachiocephalic artery without stenosis. Tortuous proximal right CCA. Minimal plaque at the right carotid bifurcation. No cervical right carotid stenosis. Left carotid system: Calcified plaque at the left CCA origin without stenosis. Tortuous proximal left CCA. Additional calcified plaque proximal to the bifurcation without  stenosis. At the bifurcation there is calcified plaque in the posterior left ICA origin and bulb with less than 50 % stenosis with respect to the distal vessel. Tortuous, dolichoectatic cervical left ICA just below the skull base without stenosis. Vertebral arteries: Mild plaque in the proximal right subclavian artery without stenosis. Tortuous proximal right subclavian. Calcified plaque at the right vertebral artery origin with only mild stenosis. Non dominant right vertebral artery is patent to the skull base with no additional plaque or stenosis. Mild to moderate soft and calcified plaque in the proximal left subclavian artery although without associated stenosis. The left vertebral artery origin remains normal. Tortuous left V1 segment. The left vertebral is dominant and patent to the skull base without stenosis. CTA HEAD Posterior circulation: Dominant left V4 segment although the right vertebral remains patent to the vertebrobasilar junction. Normal PICA origins. Tortuous but otherwise normal vertebrobasilar junction. Mildly tortuous basilar artery without irregularity or stenosis. SCA and PCA origins are normal. Tortuous P1 segments, and right side posterior communicating artery. The left is diminutive or absent. Mild irregularity and stenosis of the right PCA branches. On the left there is severe stenosis at the distal P2/proximal P3 segment and again at the left PCA bifurcation on series 17, image 24. But there is preserved distal left PCA branch enhancement. Anterior circulation: Bilateral ICA siphon dolichoectasia with minimal calcified plaque and no siphon stenosis. Ophthalmic artery and right posterior communicating artery origins are normal. Patent carotid termini. Dominant left and diminutive right ACA A1 segments. Anterior communicating artery and bilateral ACA branches are patent. There is mild distal A2 irregularity and stenosis. Right MCA M1 and bifurcation are patent with some tortuosity but no  stenosis. Right MCA branches are within normal limits. The left MCA M1 is tortuous with moderate irregularity and stenosis best seen on series 16, image 22. The left MCA bifurcation remains patent. Left MCA branches are mildly to moderately irregular, a especially in the anterior division. Venous sinuses: Early contrast timing, grossly patent. Anatomic variants: Dominant left vertebral artery, left ACA A1. Review of the MIP images confirms the above findings IMPRESSION: 1. Negative for emergent large vessel occlusion. Ectatic carotid arteries with mild for age atherosclerosis. 2. Positive for severe stenoses of the Left PCA distal P2 and PCA bifurcation. 3. But mild for age posterior circulation atherosclerosis otherwise. Dominant Left Vertebral Artery. 4. Moderate irregularity and stenosis of the Left MCA M1 is likely related to the chronic Left MCA territory infarct. 5. Left pontine infarcts seen by MRI now faintly visible by CT. No intracranial hemorrhage or mass effect. Electronically Signed   By: Odessa Fleming M.D.   On: 10/13/2019 23:24   CT ANGIO NECK W OR WO CONTRAST  Result Date: 10/13/2019 CLINICAL DATA:  86 year old female with chronic infarcts. Small acute infarcts in the left pons and right frontal lobe white matter on MRI earlier today. Recent right extremity weakness. EXAM: CT ANGIOGRAPHY HEAD AND NECK TECHNIQUE: Multidetector CT imaging of the head and neck was performed using the standard protocol during bolus administration of intravenous contrast. Multiplanar CT image reconstructions and MIPs  were obtained to evaluate the vascular anatomy. Carotid stenosis measurements (when applicable) are obtained utilizing NASCET criteria, using the distal internal carotid diameter as the denominator. CONTRAST:  80mL OMNIPAQUE IOHEXOL 350 MG/ML SOLN COMPARISON:  Brain MRI and head CT earlier today. FINDINGS: CT HEAD Brain: Subtle hypodensity now in the left pons. No acute intracranial hemorrhage identified. No  midline shift, mass effect, or evidence of intracranial mass lesion. Otherwise stable non contrast CT appearance of the brain from earlier today. Calvarium and skull base: No acute osseous abnormality identified. Hyperostosis of the calvarium. Paranasal sinuses: Visualized paranasal sinuses and mastoids are stable and well pneumatized. Orbits: No acute orbit or scalp soft tissue findings. CTA NECK Skeleton: Some carious dentition. Mild for age cervical spine degeneration. No acute osseous abnormality identified. Upper chest: No superior mediastinal lymphadenopathy. Partially visible mild chronic architectural distortion in the left lung, stable from 03/22/2018 CTA chest. Other neck: Benign appearing multinodular thyroid goiter, no follow-up indicated. (Ref: J Am Coll Radiol. 2015 Feb;12(2): 143-50).Otherwise negative neck soft tissues. Aortic arch: Mild for age arch atherosclerosis. Three vessel arch configuration. Right carotid system: Mild soft plaque in the brachiocephalic artery without stenosis. Tortuous proximal right CCA. Minimal plaque at the right carotid bifurcation. No cervical right carotid stenosis. Left carotid system: Calcified plaque at the left CCA origin without stenosis. Tortuous proximal left CCA. Additional calcified plaque proximal to the bifurcation without stenosis. At the bifurcation there is calcified plaque in the posterior left ICA origin and bulb with less than 50 % stenosis with respect to the distal vessel. Tortuous, dolichoectatic cervical left ICA just below the skull base without stenosis. Vertebral arteries: Mild plaque in the proximal right subclavian artery without stenosis. Tortuous proximal right subclavian. Calcified plaque at the right vertebral artery origin with only mild stenosis. Non dominant right vertebral artery is patent to the skull base with no additional plaque or stenosis. Mild to moderate soft and calcified plaque in the proximal left subclavian artery although  without associated stenosis. The left vertebral artery origin remains normal. Tortuous left V1 segment. The left vertebral is dominant and patent to the skull base without stenosis. CTA HEAD Posterior circulation: Dominant left V4 segment although the right vertebral remains patent to the vertebrobasilar junction. Normal PICA origins. Tortuous but otherwise normal vertebrobasilar junction. Mildly tortuous basilar artery without irregularity or stenosis. SCA and PCA origins are normal. Tortuous P1 segments, and right side posterior communicating artery. The left is diminutive or absent. Mild irregularity and stenosis of the right PCA branches. On the left there is severe stenosis at the distal P2/proximal P3 segment and again at the left PCA bifurcation on series 17, image 24. But there is preserved distal left PCA branch enhancement. Anterior circulation: Bilateral ICA siphon dolichoectasia with minimal calcified plaque and no siphon stenosis. Ophthalmic artery and right posterior communicating artery origins are normal. Patent carotid termini. Dominant left and diminutive right ACA A1 segments. Anterior communicating artery and bilateral ACA branches are patent. There is mild distal A2 irregularity and stenosis. Right MCA M1 and bifurcation are patent with some tortuosity but no stenosis. Right MCA branches are within normal limits. The left MCA M1 is tortuous with moderate irregularity and stenosis best seen on series 16, image 22. The left MCA bifurcation remains patent. Left MCA branches are mildly to moderately irregular, a especially in the anterior division. Venous sinuses: Early contrast timing, grossly patent. Anatomic variants: Dominant left vertebral artery, left ACA A1. Review of the MIP images confirms the above  findings IMPRESSION: 1. Negative for emergent large vessel occlusion. Ectatic carotid arteries with mild for age atherosclerosis. 2. Positive for severe stenoses of the Left PCA distal P2 and  PCA bifurcation. 3. But mild for age posterior circulation atherosclerosis otherwise. Dominant Left Vertebral Artery. 4. Moderate irregularity and stenosis of the Left MCA M1 is likely related to the chronic Left MCA territory infarct. 5. Left pontine infarcts seen by MRI now faintly visible by CT. No intracranial hemorrhage or mass effect. Electronically Signed   By: Odessa Fleming M.D.   On: 10/13/2019 23:24   MR BRAIN WO CONTRAST  Result Date: 10/13/2019 CLINICAL DATA:  86 year old female with recent weakness in the right leg and difficulty walking. Subsequent weakness in the right arm. EXAM: MRI HEAD WITHOUT CONTRAST TECHNIQUE: Multiplanar, multiecho pulse sequences of the brain and surrounding structures were obtained without intravenous contrast. COMPARISON:  Head CT earlier today.  Brain MRI 03/21/2018. FINDINGS: Brain: Patchy 11 mm area of restricted diffusion in the left pons with T2 and FLAIR hyperintensity, but no hemorrhage or mass effect. Superimposed subacute on chronic appearing right middle frontal gyrus white matter ischemia (series 3, image 34). Acute and chronic T2 and FLAIR hyperintensity in the area plus cystic white matter encephalomalacia. Underlying chronic ischemia in the anterior left MCA territory with encephalomalacia including the left insula. Chronic infarcts in the cerebellum a especially the right PICA territory. Chronic additional T2 and FLAIR heterogeneity throughout the deep gray nuclei. Left midbrain Wallerian degeneration. Only occasional chronic micro hemorrhages. No midline shift, mass effect, evidence of mass lesion, ventriculomegaly, extra-axial collection or acute intracranial hemorrhage. Cervicomedullary junction and pituitary are within normal limits. Vascular: Major intracranial vascular flow voids are stable since 2019 with generalized intracranial artery tortuosity. Skull and upper cervical spine: Negative for age visible cervical spine, bone marrow signal. Sinuses/Orbits:  Stable and negative. Other: Mastoids remain well pneumatized. Visible internal auditory structures appear normal. Scalp and face soft tissues appear negative. IMPRESSION: 1. Acute lacunar infarct in the left pons. No associated hemorrhage or mass effect. 2. Subacute white matter lacunar infarct in the right centrum semiovale, likely synchronous small vessel disease (see #3). 3. Underlying advanced chronic ischemic disease otherwise stable since 03/21/2018. Electronically Signed   By: Odessa Fleming M.D.   On: 10/13/2019 18:32   ECHOCARDIOGRAM COMPLETE  Result Date: 10/14/2019    ECHOCARDIOGRAM REPORT   Patient Name:   Lisa Hopkins [facility] Date of Exam: 10/14/2019 Medical Rec #:  [id number]          Height:       64.0 in Accession #:    [id number]         Weight:       117.0 lb Date of Birth:  Jun 05, 1924          BSA:          1.558 m Patient Age:    86 years           BP:           168/97 mmHg Patient Gender: F                  HR:           73 bpm. Exam Location:  Inpatient Procedure: 2D Echo, Color Doppler and Cardiac Doppler Indications:    Stroke i163.9  History:        Patient has prior history of Echocardiogram examinations, most  recent 12/01/2017. CHF, Arrythmias:Atrial Fibrillation; Risk                 Factors:Hypertension, Diabetes and Dyslipidemia.  Sonographer:    Irving Burton Senior RDCS Referring Phys: [id number] ARSHAD N KAKRAKANDY IMPRESSIONS  1. Left ventricular ejection fraction, by estimation, is 65 to 70%. The left ventricle has normal function. The left ventricle has no regional wall motion abnormalities. There is mild left ventricular hypertrophy. Left ventricular diastolic function could not be evaluated.  2. Right ventricular systolic function is low normal. The right ventricular size is normal.  3. Left atrial size was mildly dilated.  4. The mitral valve is abnormal. Mild mitral valve regurgitation.  5. The aortic valve is tricuspid. Aortic valve regurgitation is not visualized. Mild to moderate  aortic valve sclerosis/calcification is present, without any evidence of aortic stenosis. FINDINGS  Left Ventricle: Left ventricular ejection fraction, by estimation, is 65 to 70%. The left ventricle has normal function. The left ventricle has no regional wall motion abnormalities. The left ventricular internal cavity size was normal in size. There is  mild left ventricular hypertrophy. Left ventricular diastolic function could not be evaluated due to atrial fibrillation. Left ventricular diastolic function could not be evaluated. Indeterminate filling pressures. Right Ventricle: The right ventricular size is normal. No increase in right ventricular wall thickness. Right ventricular systolic function is low normal. Left Atrium: Left atrial size was mildly dilated. Right Atrium: Right atrial size was normal in size. Pericardium: There is no evidence of pericardial effusion. Mitral Valve: The mitral valve is abnormal. Mild mitral annular calcification. Mild mitral valve regurgitation. Tricuspid Valve: The tricuspid valve is grossly normal. Tricuspid valve regurgitation is mild. Aortic Valve: The aortic valve is tricuspid. Aortic valve regurgitation is not visualized. Mild to moderate aortic valve sclerosis/calcification is present, without any evidence of aortic stenosis. Pulmonic Valve: The pulmonic valve was normal in structure. Pulmonic valve regurgitation is not visualized. Aorta: The aortic root and ascending aorta are structurally normal, with no evidence of dilitation. Venous: The inferior vena cava was not well visualized. IAS/Shunts: No atrial level shunt detected by color flow Doppler.  LEFT VENTRICLE PLAX 2D LVIDd:         3.30 cm  Diastology LVIDs:         2.20 cm  LV e' lateral: 7.38 cm/s LV PW:         1.00 cm  LV e' medial:  7.46 cm/s LV IVS:        1.00 cm LVOT diam:     2.20 cm LV SV:         48 LV SV Index:   31 LVOT Area:     3.80 cm  RIGHT VENTRICLE RV S prime:     9.07 cm/s TAPSE (M-mode): 1.5 cm  LEFT ATRIUM             Index       RIGHT ATRIUM           Index LA diam:        3.10 cm 1.99 cm/m  RA Area:     16.20 cm LA Vol (A2C):   48.0 ml 30.81 ml/m RA Volume:   35.90 ml  23.05 ml/m LA Vol (A4C):   56.6 ml 36.33 ml/m LA Biplane Vol: 53.5 ml 34.34 ml/m  AORTIC VALVE LVOT Vmax:   60.70 cm/s LVOT Vmean:  41.300 cm/s LVOT VTI:    0.126 m  AORTA Ao Root diam: 3.20 cm TRICUSPID VALVE TR  Peak grad:   26.0 mmHg TR Vmax:        255.00 cm/s  SHUNTS Systemic VTI:  0.13 m Systemic Diam: 2.20 cm Zoila Shutter MD Electronically signed by Zoila Shutter MD Signature Date/Time: 10/14/2019/10:53:01 AM    Final     PHYSICAL EXAM  Temp:  [97.4 F (36.3 C)-98 F (36.7 C)] 98 F (36.7 C) (05/18 1137) Pulse Rate:  [58-77] 71 (05/18 1137) Resp:  [14-25] 17 (05/18 1137) BP: (145-184)/(79-106) 145/79 (05/18 1137) SpO2:  [93 %-98 %] 98 % (05/18 1137) Weight:  [53.1 kg] 53.1 kg (05/18 0631)  General - Well nourished, well developed, in no apparent distress.  Ophthalmologic - fundi not visualized due to noncooperation.  Cardiovascular - irregularly irregular heart rate and rhythm.  Mental Status -  Level of arousal and orientation to time, place, and person were intact. Language including expression, naming, repetition, comprehension was assessed and found intact. Mild dysarthria.  Cranial Nerves II - XII - II - Visual field intact OU. III, IV, VI - Extraocular movements intact. V - Facial sensation intact bilaterally. VII - Facial movement intact bilaterally. VIII - Hearing & vestibular intact bilaterally. X - Palate elevates symmetrically. XI - Chin turning & shoulder shrug intact bilaterally. XII - Tongue protrusion intact.  Motor Strength - The patient's strength was 4/5 LUE and LLE, however, RUE 3/5 with drift and RLE 3-/5 proximal and 4/5 distally.  Bulk was normal and fasciculations were absent.   Motor Tone - Muscle tone was assessed at the neck and appendages and was normal.  Reflexes  - The patient's reflexes were symmetrical in all extremities and she had no pathological reflexes.  Sensory - Light touch, temperature/pinprick were assessed and were symmetrical in the UEs but decreased sensation at RLE.    Coordination - The patient had normal movements in the left hand with no ataxia or dysmetria. However, right FTN ataxic but proportional to the weakness. Tremor was absent.  Gait and Station - deferred.   ASSESSMENT/PLAN Lisa Hopkins is a 86 y.o. female with history of stroke w/ resultant aphasia, CKD, AF on warfarin, DB, HTN, HLD, RLE DVT 2019 presenting with new RLE weakness and difficulty ambulating since last Tues 5/11 who developed R arm weakness, slowed speech and confusion following.   Stroke:   L pontine acute and right SO subacute infarct secondary to small vessel disease source  CT head No acute abnormality.   MRI  L pontine infarct. Subacute R centrum semiovale white matter lacune. Small vessel disease.   CTA head & neck no LVO. L distal P2 and PCA bifurcation w/ severe stenosis. L M2 moderate irregularity and stenosis.   2D Echo EF 65-70%. No source of embolus. LA mildly dilated  LDL 124  HgbA1c pending   Warfarin for VTE prophylaxis  warfarin daily prior to admission, warfarin daily continued in hospital. INR goal 2-3. Given small vessel stroke in the setting of therapeutic INR, will add ASA 81 on top of coumadin.   Therapy recommendations:  SNF   Disposition:  pending   Atrial Fibrillation, chronic  Rate controlled   Home anticoagulation:  warfarin daily continued in the hospital  INR on admission 2.3; Last INR 2.1 . Continue warfarin daily at discharge with INR goal 2-3 . Pt has been compliant with coumadin     Hypertension  Elevated on admission to 196/81   Stable . Permissive hypertension (OK if < 180/105) but gradually normalize in 3-5 days . Long-term BP  goal normotensive  Hyperlipidemia  Home meds:  No  statin  Now on lipitor 20 given her advanced age and hx of elevated CPK with zocor  LDL 124, goal < 70  Add statin once po access  Diabetes type II, diet controlled  HgbA1c pending, goal < 7.0  CBGs  SSI  PCP follow up  Other Stroke Risk Factors  Advanced age  Hx stroke/TIA  Hx stroke prior to 4. Details not available in Epic.   Family hx stroke (mother)  RLE DVT 2019 - continue coumadin   UTI  UA WBC 21-50  Urine culture pending  On rocephin  Other Active Problems  Mild hypokalemia - resolved K 3.2->3.8  CKD stage III - Cre normal at 0.98 this admission  Mild leukocytosis - resolved WBC 13.1->9.5  Hospital day # 1  Neurology will sign off. Please call with questions. Pt will follow up with stroke clinic NP at [facility] in about 4 weeks. Thanks for the consult.  Marvel Plan, MD PhD Stroke Neurology 10/14/2019 5:03 PM   To contact Stroke Continuity provider, please refer to [email or web address]. After hours, contact General Neurology

## 2019-10-14 NOTE — Progress Notes (Signed)
Echocardiogram 2D Echocardiogram has been performed.  Lisa Hopkins Lisa Hopkins 10/14/2019, 10:49 AM

## 2019-10-14 NOTE — Care Management (Signed)
Benefit check submitted for Eliquis, xaralto, and pradaxa. Results pending.

## 2019-10-14 NOTE — TOC Benefit Eligibility Note (Signed)
Transition of Care [facility]) Benefit Eligibility Note    Patient Details  Name: Lisa Hopkins MRN: [id number] Date of Birth: [birth year]   Medication/Dose: Carlena Hurl 5 MG  BID CO-PAY- $37.00       ELIQUIS  5 MG BID  CO-PAY- $37.00  Covered?: Yes  Tier: 3 Drug  Prescription Coverage Preferred Pharmacy: [facility]  Spoke with Person/Company/Phone Number:: HEATHER   @ [employer] # [phone]  Co-Pay: $37.00  Prior Approval: No  Deductible: Unmet(OUT-OF-POCKET-UNMET)  Additional Notes: PRADAXA   75 MG BID  and   PRADAXA   150 MG BID  COVER- YES CO-PAY- $300.36-NON PREFERRED   TIER- 4 DRUG  P/A-NO    Mardene Sayer Phone Number: 10/14/2019, 11:15 AM

## 2019-10-14 NOTE — Progress Notes (Signed)
Physical Therapy Treatment Patient Details Name: Lisa Hopkins MRN: [id number] DOB: 10-Jun-1924 Today's Date: 10/14/2019    History of Present Illness 86 y.o. female with history of CVA (residual aphasia) A. fib hypertension diabetes mellitus DVT RLE was brought to the ER 10/13/19 after patient was having persistent right-sided weakness since Oct 07, 2019. She saw PCP, prescribed prednisone for possible pinched nerve. Started developing weakness of the right upper extremity. MRI brain acute left pontine infarct and also infarct in the right semiovale.    PT Comments    Pt admitted with above diagnosis. Son present and able to provide functional history (walking independently with RW and living in ILF). Patient required 2 person assist (for safety and lines) for OOB to [facility] and short distance ambulation in her room. Followed many verbal commands, having greater difficulty expressing herself (word-finding especially).  Pt currently with functional limitations due to the deficits listed below (see PT Problem List). Pt will benefit from skilled PT to increase their independence and safety with mobility to allow discharge to the venue listed below.      Follow Up Recommendations  SNF     Equipment Recommendations  None recommended by PT    Recommendations for Other Services       Precautions / Restrictions Precautions Precautions: Fall Precaution Comments: expressive aphasia at baseline  Restrictions Weight Bearing Restrictions: No    Mobility  Bed Mobility Overal bed mobility: Needs Assistance Bed Mobility: Supine to Sit;Sit to Supine     Supine to sit: Min assist;+2 for safety/equipment Sit to supine: Mod assist;+2 for physical assistance;+2 for safety/equipment   General bed mobility comments: min assist for trunk and scooting foward, return to supine with assist for trunk control and LB elevation to supine; increased time for sequencing and cueing for technique    Transfers Overall transfer level: Needs assistance Equipment used: Rolling walker (2 wheeled);2 person hand held assist Transfers: Sit to/from [employer] Sit to Stand: Min assist;+2 physical assistance;+2 safety/equipment Stand pivot transfers: Min assist;+2 physical assistance;+2 safety/equipment       General transfer comment: min assist +2 to power up and steady with cueing for hand placement, improved to min guard +2 sit to stand with use of RW vs HHA   Ambulation/Gait Ambulation/Gait assistance: Mod assist Gait Distance (Feet): 4 Feet(6 ft) Assistive device: Rolling walker (2 wheeled);2 person hand held assist Gait Pattern/deviations: Step-to pattern;Decreased stride length;Decreased dorsiflexion - right;Shuffle;Staggering right;Trunk flexed   Gait velocity interpretation: <1.31 ft/sec, indicative of household ambulator General Gait Details: initial +2 HHA from [facility] to sink with pt having difficulty advancing RLE (incr time/effort) with loss of balance as approaching sink (?lunging to grab counter) with mod assist to prevent fall; return to bed using RW with improved balance, however required assist to maneuver RW   Stairs             Wheelchair Mobility    Modified Rankin (Stroke Patients Only) Modified Rankin (Stroke Patients Only) Pre-Morbid Rankin Score: Moderate disability Modified Rankin: Moderately severe disability     Balance Overall balance assessment: Needs assistance Sitting-balance support: No upper extremity supported;Feet supported Sitting balance-Leahy Scale: Fair     Standing balance support: Bilateral upper extremity supported;During functional activity;No upper extremity supported Standing balance-Leahy Scale: Poor Standing balance comment: relies on RW or UE support, R lateral lean and slight posterior lean with grooming tasks 0 hand support  Cognition Arousal/Alertness:  Awake/alert Behavior During Therapy: WFL for tasks assessed/performed Overall Cognitive Status: Difficult to assess Area of Impairment: Following commands;Safety/judgement;Awareness;Problem solving                       Following Commands: Follows one step commands consistently;Follows one step commands with increased time;Follows multi-step commands inconsistently Safety/Judgement: Decreased awareness of safety;Decreased awareness of deficits Awareness: Intellectual Problem Solving: Slow processing;Requires verbal cues;Difficulty sequencing;Requires tactile cues General Comments: patient follows simple commands with increased time, requires multimodal cueing and cueing for safety awareness; question ideational apraxia (bringing soap to mouth)        Exercises      General Comments General comments (skin integrity, edema, etc.): son present and supportive       Pertinent Vitals/Pain Pain Assessment: Faces Faces Pain Scale: No hurt    Home Living Family/patient expects to be discharged to:: Skilled nursing facility     Type of Home: Independent living facility         Additional Comments: from [facility], son reports looking into ALF     Prior Function Level of Independence: Needs assistance  Gait / Transfers Assistance Needed: reports using RW for mobility  ADL's / Homemaking Assistance Needed: reports needing assist for ADLs, IADLs  Comments: limited historian, initally reports dressing self but assist with bathing but then reports requires assist with dressing as well   PT Goals (current goals can now be found in the care plan section) Acute Rehab PT Goals Patient Stated Goal: to get some food  PT Goal Formulation: Patient unable to participate in goal setting Time For Goal Achievement: 10/28/19    Frequency    Min 3X/week      PT Plan      Co-evaluation   Reason for Co-Treatment: Necessary to address cognition/behavior during  functional activity;For patient/therapist safety;To address functional/ADL transfers;Other (comment)(activity tolerance)   OT goals addressed during session: ADL's and self-care      AM-PAC PT "6 Clicks" Mobility   Outcome Measure  Help needed turning from your back to your side while in a flat bed without using bedrails?: A Little Help needed moving from lying on your back to sitting on the side of a flat bed without using bedrails?: A Little Help needed moving to and from a bed to a chair (including a wheelchair)?: A Lot Help needed standing up from a chair using your arms (e.g., wheelchair or bedside chair)?: A Lot Help needed to walk in hospital room?: A Lot Help needed climbing 3-5 steps with a railing? : Total 6 Click Score: 13    End of Session Equipment Utilized During Treatment: Gait belt Activity Tolerance: Patient tolerated treatment well Patient left: in bed;with call bell/phone within reach;with chair alarm set;with family/visitor present Nurse Communication: Mobility status;Other (comment)(requesting food ordered (just returned from swallow study)) PT Visit Diagnosis: Hemiplegia and hemiparesis;Other abnormalities of gait and mobility (R26.89) Hemiplegia - Right/Left: Right Hemiplegia - dominant/non-dominant: Dominant     Time: 1324-4010 PT Time Calculation (min) (ACUTE ONLY): 29 min  Charges:                         Arby Barrette, PT Pager [phone]    Rexanne Mano 10/14/2019, 4:18 PM

## 2019-10-14 NOTE — Progress Notes (Signed)
PROGRESS NOTE  Lisa Hopkins BPZ:[id number] DOB: [birth year] DOA: 10/13/2019 PCP: Lucky Cowboy, MD  HPI/Recap of past 24 hours: Lisa Hopkins is a 86 y.o. female with history of CVA, permanent A. fib on Coumadin, hypertension who was brought to the ER by her son after having persistent right-sided weakness.    Last known well was 6 days prior to presentation.  Per patient's son, symptoms started on Oct 07, 2019 when patient had weakness of the right lower extremity and was taken to her primary care physician and was prescribed prednisone for possible pinched nerve.  Following which patient started developing weakness of the right upper extremity and this progressively worsened.  She was brought in for further evaluation.  ED Course: MRI of the brain shows acute left pontine infarct and infarct in the right semiovale.  Seen by neurology/stroke team Dr. Otelia Limes.  TRH asked to admit for further stroke work up.  Labs revealed therapeutic INR of 2.3 with EKG showing A. fib flutter.  Rate controlled.  WBC was 13.1 K, UA showed features concerning for UTI.  Patient failed initial bedside swallow evaluation.  10/14/19:  Seen and examined.  R sided weakness persists.  States she has been having diarrhea.   Assessment/Plan: Principal Problem:   Acute CVA (cerebrovascular accident) [facility]) Active Problems:   Essential hypertension   Atrial fibrillation (HCC)   Diet-controlled type 2 diabetes mellitus (HCC)   CKD stage 2 due to type 2 diabetes mellitus (HCC)   Acute lacunar infarct in the left pons/subacute white matter lacunar infarct in the right centrum Presented with 6-day history of right-sided weakness. Findings stated above, were seen on MRI brain, additionally advanced chronic ischemic disease, diffuse atrophy, chronic left frontal lobe medium-sized ischemic infarction and chronic right cerebellar medium-sized /ischemic infarctions. Followed by neuro stroke team CTA head and neck  negative for large vessel occlusion, but showing severe stenosis of the left PCA distal P2 and PCA bifurcation. Currently on warfarin, continue per neurology Pharmacy managing, INR therapeutic. PT OT recommended SNF Speech therapist post MBS recommends dysphagia 2 diet, fine chopped solids with nectar thick liquid LDL 124, goal less than 70 Hemoglobin A1c 6.2, goal less than 7.0, at goal 2D echo LVEF 65 to 70%, no PFO, no source of emboli  Patient has an insensitivity to statin, causing elevated CPK, therefore was started on low-dose Lipitor per neuro, neurology discussed with son who is okay with it. Continue aspirin 81 mg daily, Coumadin, and Lipitor 20 mg daily as recommended by neurology Out of window for permissive hypertension  Hyperlipidemia LDL 124, goal less than 70 Continue Lipitor 20 mg daily Started on low-dose statin due to insensitivity to statin, causing elevated CPK Neurology discussed with her son, okay to start it  Permanent A. fib Rate controlled Resume p.o. Lopressor 12.5 mg twice daily Continue to monitor vital signs  Dysphagia in the setting of acute stroke Post MBS study recommendation for dysphagia 2 diet, fine chopped solids and nectar thick liquid Continue aspiration precautions Speech therapist following, appreciate recommendations  Ambulatory dysfunction in the setting of acute CVA PT OT with recommendation for SNF TOC assisting with SNF placement Continue PT OT with assistance and fall precautions.  Presumptive UTI Presented with leukocytosis 13 K which has resolved. UA done on admission positive for pyuria Urine culture in process, awaiting ID and sensitivities. Started on Rocephin empirically Follow cultures Continue gentle IV fluid hydration at lower rate 50 cc/h x 1 day  Essential hypertension BP  stable Resume Lopressor p.o. 12.5 mg twice daily Continue to monitor vital signs P.o. Lasix, losartan held to avoid hypotension  Moderate  protein calorie malnutrition Albumin 3.4 BMI 20 Some muscle mass loss Encourage oral intake as tolerated Continue aspiration precautions  CKD 3A Appears to be at her baseline creatinine 0.8 with GFR 58 Continue to avoid nephrotoxins Continue gentle IV fluid hydration x1 day Continue to hold off p.o. Lasix Monitor urine output  DVT prophylaxis:  Coumadin Code Status: DNR as confirmed with patient. Family Communication:  None at bedside.  Consults called: Neurology.    Status is: Inpatient    Dispo: The patient is from: Home.               Anticipated d/c is to: SNF, pending placement.              Anticipated d/c date is: 10/16/19 or once urine culture ID and sensitivities have resulted or when neurology signs off               Patient currently ongoing treatment for UTI with IV antibiotics.  ID and sensitivities are pending.         Objective: Vitals:   10/14/19 0550 10/14/19 0631 10/14/19 0817 10/14/19 1137  BP: (!) 179/94  (!) 168/97 (!) 145/79  Pulse: 71  63 71  Resp: 16  20 17   Temp: 97.9 F (36.6 C)  (!) 97.4 F (36.3 C) 98 F (36.7 C)  TempSrc: Oral  Oral Oral  SpO2:   98% 98%  Weight:  53.1 kg    Height:  5\' 4"  (1.626 m)      Intake/Output Summary (Last 24 hours) at 10/14/2019 1544 Last data filed at 10/14/2019 [id number] Gross per 24 hour  Intake 120 ml  Output 300 ml  Net -180 ml   Filed Weights   10/14/19 0631  Weight: 53.1 kg    Exam:  . General: 86 y.o. year-old female well developed well nourished in no acute distress. . Cardiovascular: Irregular rate and rhythm with no rubs or gallops.  Marland Kitchen. Respiratory: Clear to auscultation with no wheezes or rales. . Abdomen: Soft nontender nondistended with normal bowel sounds x4 quadrants. . Musculoskeletal: No lower extremity edema bilaterally.  Right-sided weakness. Marland Kitchen. Psychiatry: Mood is appropriate for condition and setting   Data Reviewed: CBC: Recent Labs  Lab 10/09/19 1632 10/13/19 1109  10/14/19 0548  WBC 7.5 13.1* 9.5  NEUTROABS 3,765 7.2 5.1  HGB 14.9 15.0 14.5  HCT 45.3* 47.7* 44.8  MCV 91.1 96.4 93.5  PLT 251 264 273   Basic Metabolic Panel: Recent Labs  Lab 10/09/19 1632 10/13/19 1109 10/14/19 0548  NA 141 137 139  K 5.4* 3.2* 3.8  CL 101 103 103  CO2 31 22 28   GLUCOSE 129* 131* 122*  BUN 15 16 15   CREATININE 0.94* 0.98 0.87  CALCIUM 10.8* 9.3 9.3   GFR: Estimated Creatinine Clearance: 34.6 mL/min (by C-G formula based on SCr of 0.87 mg/dL). Liver Function Tests: Recent Labs  Lab 10/09/19 1632 10/13/19 1109 10/14/19 0548  AST 19 26 22   ALT 16 24 22   ALKPHOS  --  102 100  BILITOT 0.7 0.6 0.8  PROT 7.6 7.2 6.6  ALBUMIN  --  3.9 3.4*   No results for input(s): LIPASE, AMYLASE in the last 168 hours. No results for input(s): AMMONIA in the last 168 hours. Coagulation Profile: Recent Labs  Lab 10/13/19 1109 10/14/19 0913  INR 2.3* 2.1*  Cardiac Enzymes: No results for input(s): CKTOTAL, CKMB, CKMBINDEX, TROPONINI in the last 168 hours. BNP (last 3 results) No results for input(s): PROBNP in the last 8760 hours. HbA1C: No results for input(s): HGBA1C in the last 72 hours. CBG: Recent Labs  Lab 10/13/19 2152 10/14/19 0224 10/14/19 0549 10/14/19 0820 10/14/19 1139  GLUCAP 162* 110* 99 94 105*   Lipid Profile: Recent Labs    10/14/19 0548  CHOL 217*  HDL 54  LDLCALC 124*  TRIG 196*  CHOLHDL 4.0   Thyroid Function Tests: No results for input(s): TSH, T4TOTAL, FREET4, T3FREE, THYROIDAB in the last 72 hours. Anemia Panel: No results for input(s): VITAMINB12, FOLATE, FERRITIN, TIBC, IRON, RETICCTPCT in the last 72 hours. Urine analysis:    Component Value Date/Time   COLORURINE YELLOW 10/13/2019 1904   APPEARANCEUR HAZY (A) 10/13/2019 1904   LABSPEC 1.010 10/13/2019 1904   PHURINE 5.0 10/13/2019 1904   GLUCOSEU NEGATIVE 10/13/2019 1904   HGBUR SMALL (A) 10/13/2019 1904   BILIRUBINUR NEGATIVE 10/13/2019 1904   KETONESUR  NEGATIVE 10/13/2019 1904   PROTEINUR NEGATIVE 10/13/2019 1904   UROBILINOGEN 1.0 04/02/2015 1244   NITRITE POSITIVE (A) 10/13/2019 1904   LEUKOCYTESUR LARGE (A) 10/13/2019 1904   Sepsis Labs: @LABRCNTIP (procalcitonin:4,lacticidven:4)  ) Recent Results (from the past 240 hour(s))  Urine Culture     Status: None   Collection Time: 10/09/19  4:32 PM  Result Value Ref Range Status   MICRO NUMBER: [id number]  Final   SPECIMEN QUALITY: Adequate  Final   Sample Source URINE  Final   STATUS: FINAL  Final   Result:   Final    Growth of mixed flora was isolated, suggesting probable contamination. No further testing will be performed. If clinically indicated, recollection using a method to minimize contamination, with prompt transfer to Urine Culture Transport Tube, is  recommended.   SARS Coronavirus 2 by RT PCR (hospital order, performed in [facility] hospital lab) Nasopharyngeal Nasopharyngeal Swab     Status: None   Collection Time: 10/13/19 12:30 PM   Specimen: Nasopharyngeal Swab  Result Value Ref Range Status   SARS Coronavirus 2 NEGATIVE NEGATIVE Final    Comment: (NOTE) SARS-CoV-2 target nucleic acids are NOT DETECTED. The SARS-CoV-2 RNA is generally detectable in upper and lower respiratory specimens during the acute phase of infection. The lowest concentration of SARS-CoV-2 viral copies this assay can detect is 250 copies / mL. A negative result does not preclude SARS-CoV-2 infection and should not be used as the sole basis for treatment or other patient management decisions.  A negative result may occur with improper specimen collection / handling, submission of specimen other than nasopharyngeal swab, presence of viral mutation(s) within the areas targeted by this assay, and inadequate number of viral copies (<250 copies / mL). A negative result must be combined with clinical observations, patient history, and epidemiological information. Fact Sheet for Patients:     BoilerBrush.com.cy Fact Sheet for Healthcare Providers: https://pope.com/ This test is not yet approved or cleared  by the [place] FDA and has been authorized for detection and/or diagnosis of SARS-CoV-2 by FDA under an Emergency Use Authorization (EUA).  This EUA will remain in effect (meaning this test can be used) for the duration of the COVID-19 declaration under Section 564(b)(1) of the Act, 21 U.S.C. section 360bbb-3(b)(1), unless the authorization is terminated or revoked sooner. Performed at [facility], [address] [place], [place] [address]       Studies: CT ANGIO  HEAD W OR WO CONTRAST  Result Date: 10/13/2019 CLINICAL DATA:  86 year old female with chronic infarcts. Small acute infarcts in the left pons and right frontal lobe white matter on MRI earlier today. Recent right extremity weakness. EXAM: CT ANGIOGRAPHY HEAD AND NECK TECHNIQUE: Multidetector CT imaging of the head and neck was performed using the standard protocol during bolus administration of intravenous contrast. Multiplanar CT image reconstructions and MIPs were obtained to evaluate the vascular anatomy. Carotid stenosis measurements (when applicable) are obtained utilizing NASCET criteria, using the distal internal carotid diameter as the denominator. CONTRAST:  80mL OMNIPAQUE IOHEXOL 350 MG/ML SOLN COMPARISON:  Brain MRI and head CT earlier today. FINDINGS: CT HEAD Brain: Subtle hypodensity now in the left pons. No acute intracranial hemorrhage identified. No midline shift, mass effect, or evidence of intracranial mass lesion. Otherwise stable non contrast CT appearance of the brain from earlier today. Calvarium and skull base: No acute osseous abnormality identified. Hyperostosis of the calvarium. Paranasal sinuses: Visualized paranasal sinuses and mastoids are stable and well pneumatized. Orbits: No acute orbit or scalp soft tissue findings. CTA NECK  Skeleton: Some carious dentition. Mild for age cervical spine degeneration. No acute osseous abnormality identified. Upper chest: No superior mediastinal lymphadenopathy. Partially visible mild chronic architectural distortion in the left lung, stable from 03/22/2018 CTA chest. Other neck: Benign appearing multinodular thyroid goiter, no follow-up indicated. (Ref: J Am Coll Radiol. 2015 Feb;12(2): 143-50).Otherwise negative neck soft tissues. Aortic arch: Mild for age arch atherosclerosis. Three vessel arch configuration. Right carotid system: Mild soft plaque in the brachiocephalic artery without stenosis. Tortuous proximal right CCA. Minimal plaque at the right carotid bifurcation. No cervical right carotid stenosis. Left carotid system: Calcified plaque at the left CCA origin without stenosis. Tortuous proximal left CCA. Additional calcified plaque proximal to the bifurcation without stenosis. At the bifurcation there is calcified plaque in the posterior left ICA origin and bulb with less than 50 % stenosis with respect to the distal vessel. Tortuous, dolichoectatic cervical left ICA just below the skull base without stenosis. Vertebral arteries: Mild plaque in the proximal right subclavian artery without stenosis. Tortuous proximal right subclavian. Calcified plaque at the right vertebral artery origin with only mild stenosis. Non dominant right vertebral artery is patent to the skull base with no additional plaque or stenosis. Mild to moderate soft and calcified plaque in the proximal left subclavian artery although without associated stenosis. The left vertebral artery origin remains normal. Tortuous left V1 segment. The left vertebral is dominant and patent to the skull base without stenosis. CTA HEAD Posterior circulation: Dominant left V4 segment although the right vertebral remains patent to the vertebrobasilar junction. Normal PICA origins. Tortuous but otherwise normal vertebrobasilar junction. Mildly  tortuous basilar artery without irregularity or stenosis. SCA and PCA origins are normal. Tortuous P1 segments, and right side posterior communicating artery. The left is diminutive or absent. Mild irregularity and stenosis of the right PCA branches. On the left there is severe stenosis at the distal P2/proximal P3 segment and again at the left PCA bifurcation on series 17, image 24. But there is preserved distal left PCA branch enhancement. Anterior circulation: Bilateral ICA siphon dolichoectasia with minimal calcified plaque and no siphon stenosis. Ophthalmic artery and right posterior communicating artery origins are normal. Patent carotid termini. Dominant left and diminutive right ACA A1 segments. Anterior communicating artery and bilateral ACA branches are patent. There is mild distal A2 irregularity and stenosis. Right MCA M1 and bifurcation are patent with some tortuosity but  no stenosis. Right MCA branches are within normal limits. The left MCA M1 is tortuous with moderate irregularity and stenosis best seen on series 16, image 22. The left MCA bifurcation remains patent. Left MCA branches are mildly to moderately irregular, a especially in the anterior division. Venous sinuses: Early contrast timing, grossly patent. Anatomic variants: Dominant left vertebral artery, left ACA A1. Review of the MIP images confirms the above findings IMPRESSION: 1. Negative for emergent large vessel occlusion. Ectatic carotid arteries with mild for age atherosclerosis. 2. Positive for severe stenoses of the Left PCA distal P2 and PCA bifurcation. 3. But mild for age posterior circulation atherosclerosis otherwise. Dominant Left Vertebral Artery. 4. Moderate irregularity and stenosis of the Left MCA M1 is likely related to the chronic Left MCA territory infarct. 5. Left pontine infarcts seen by MRI now faintly visible by CT. No intracranial hemorrhage or mass effect. Electronically Signed   By: Genevie Ann M.D.   On: 10/13/2019  23:24   CT ANGIO NECK W OR WO CONTRAST  Result Date: 10/13/2019 CLINICAL DATA:  86 year old female with chronic infarcts. Small acute infarcts in the left pons and right frontal lobe white matter on MRI earlier today. Recent right extremity weakness. EXAM: CT ANGIOGRAPHY HEAD AND NECK TECHNIQUE: Multidetector CT imaging of the head and neck was performed using the standard protocol during bolus administration of intravenous contrast. Multiplanar CT image reconstructions and MIPs were obtained to evaluate the vascular anatomy. Carotid stenosis measurements (when applicable) are obtained utilizing NASCET criteria, using the distal internal carotid diameter as the denominator. CONTRAST:  85mL OMNIPAQUE IOHEXOL 350 MG/ML SOLN COMPARISON:  Brain MRI and head CT earlier today. FINDINGS: CT HEAD Brain: Subtle hypodensity now in the left pons. No acute intracranial hemorrhage identified. No midline shift, mass effect, or evidence of intracranial mass lesion. Otherwise stable non contrast CT appearance of the brain from earlier today. Calvarium and skull base: No acute osseous abnormality identified. Hyperostosis of the calvarium. Paranasal sinuses: Visualized paranasal sinuses and mastoids are stable and well pneumatized. Orbits: No acute orbit or scalp soft tissue findings. CTA NECK Skeleton: Some carious dentition. Mild for age cervical spine degeneration. No acute osseous abnormality identified. Upper chest: No superior mediastinal lymphadenopathy. Partially visible mild chronic architectural distortion in the left lung, stable from 03/22/2018 CTA chest. Other neck: Benign appearing multinodular thyroid goiter, no follow-up indicated. (Ref: J Am Coll Radiol. 2015 Feb;12(2): 143-50).Otherwise negative neck soft tissues. Aortic arch: Mild for age arch atherosclerosis. Three vessel arch configuration. Right carotid system: Mild soft plaque in the brachiocephalic artery without stenosis. Tortuous proximal right CCA.  Minimal plaque at the right carotid bifurcation. No cervical right carotid stenosis. Left carotid system: Calcified plaque at the left CCA origin without stenosis. Tortuous proximal left CCA. Additional calcified plaque proximal to the bifurcation without stenosis. At the bifurcation there is calcified plaque in the posterior left ICA origin and bulb with less than 50 % stenosis with respect to the distal vessel. Tortuous, dolichoectatic cervical left ICA just below the skull base without stenosis. Vertebral arteries: Mild plaque in the proximal right subclavian artery without stenosis. Tortuous proximal right subclavian. Calcified plaque at the right vertebral artery origin with only mild stenosis. Non dominant right vertebral artery is patent to the skull base with no additional plaque or stenosis. Mild to moderate soft and calcified plaque in the proximal left subclavian artery although without associated stenosis. The left vertebral artery origin remains normal. Tortuous left V1 segment. The left vertebral is  dominant and patent to the skull base without stenosis. CTA HEAD Posterior circulation: Dominant left V4 segment although the right vertebral remains patent to the vertebrobasilar junction. Normal PICA origins. Tortuous but otherwise normal vertebrobasilar junction. Mildly tortuous basilar artery without irregularity or stenosis. SCA and PCA origins are normal. Tortuous P1 segments, and right side posterior communicating artery. The left is diminutive or absent. Mild irregularity and stenosis of the right PCA branches. On the left there is severe stenosis at the distal P2/proximal P3 segment and again at the left PCA bifurcation on series 17, image 24. But there is preserved distal left PCA branch enhancement. Anterior circulation: Bilateral ICA siphon dolichoectasia with minimal calcified plaque and no siphon stenosis. Ophthalmic artery and right posterior communicating artery origins are normal. Patent  carotid termini. Dominant left and diminutive right ACA A1 segments. Anterior communicating artery and bilateral ACA branches are patent. There is mild distal A2 irregularity and stenosis. Right MCA M1 and bifurcation are patent with some tortuosity but no stenosis. Right MCA branches are within normal limits. The left MCA M1 is tortuous with moderate irregularity and stenosis best seen on series 16, image 22. The left MCA bifurcation remains patent. Left MCA branches are mildly to moderately irregular, a especially in the anterior division. Venous sinuses: Early contrast timing, grossly patent. Anatomic variants: Dominant left vertebral artery, left ACA A1. Review of the MIP images confirms the above findings IMPRESSION: 1. Negative for emergent large vessel occlusion. Ectatic carotid arteries with mild for age atherosclerosis. 2. Positive for severe stenoses of the Left PCA distal P2 and PCA bifurcation. 3. But mild for age posterior circulation atherosclerosis otherwise. Dominant Left Vertebral Artery. 4. Moderate irregularity and stenosis of the Left MCA M1 is likely related to the chronic Left MCA territory infarct. 5. Left pontine infarcts seen by MRI now faintly visible by CT. No intracranial hemorrhage or mass effect. Electronically Signed   By: Odessa Fleming M.D.   On: 10/13/2019 23:24   MR BRAIN WO CONTRAST  Result Date: 10/13/2019 CLINICAL DATA:  86 year old female with recent weakness in the right leg and difficulty walking. Subsequent weakness in the right arm. EXAM: MRI HEAD WITHOUT CONTRAST TECHNIQUE: Multiplanar, multiecho pulse sequences of the brain and surrounding structures were obtained without intravenous contrast. COMPARISON:  Head CT earlier today.  Brain MRI 03/21/2018. FINDINGS: Brain: Patchy 11 mm area of restricted diffusion in the left pons with T2 and FLAIR hyperintensity, but no hemorrhage or mass effect. Superimposed subacute on chronic appearing right middle frontal gyrus white matter  ischemia (series 3, image 34). Acute and chronic T2 and FLAIR hyperintensity in the area plus cystic white matter encephalomalacia. Underlying chronic ischemia in the anterior left MCA territory with encephalomalacia including the left insula. Chronic infarcts in the cerebellum a especially the right PICA territory. Chronic additional T2 and FLAIR heterogeneity throughout the deep gray nuclei. Left midbrain Wallerian degeneration. Only occasional chronic micro hemorrhages. No midline shift, mass effect, evidence of mass lesion, ventriculomegaly, extra-axial collection or acute intracranial hemorrhage. Cervicomedullary junction and pituitary are within normal limits. Vascular: Major intracranial vascular flow voids are stable since 2019 with generalized intracranial artery tortuosity. Skull and upper cervical spine: Negative for age visible cervical spine, bone marrow signal. Sinuses/Orbits: Stable and negative. Other: Mastoids remain well pneumatized. Visible internal auditory structures appear normal. Scalp and face soft tissues appear negative. IMPRESSION: 1. Acute lacunar infarct in the left pons. No associated hemorrhage or mass effect. 2. Subacute white matter lacunar infarct in  the right centrum semiovale, likely synchronous small vessel disease (see #3). 3. Underlying advanced chronic ischemic disease otherwise stable since 03/21/2018. Electronically Signed   By: Odessa Fleming M.D.   On: 10/13/2019 18:32   DG Swallowing Func-Speech Pathology  Result Date: 10/14/2019 Objective Swallowing Evaluation: Type of Study: MBS-Modified Barium Swallow Study  Patient Details Name: Lisa Hopkins MRN: [id number] Date of Birth: 09-06-1924 Today's Date: 10/14/2019 Time: SLP Start Time (ACUTE ONLY): 1220 -SLP Stop Time (ACUTE ONLY): 1246 SLP Time Calculation (min) (ACUTE ONLY): 26 min Past Medical History: Past Medical History: Diagnosis Date . Anxiety  . Aphasia as late effect of cerebrovascular accident  . Atrial  fibrillation (HCC)  . CKD (chronic kidney disease), stage II  . CVA (cerebral infarction)  . Diabetes mellitus type II  . DVT, lower extremity, distal, acute, right (HCC) 03/25/2018  Right peroneal vein 03/22/2018 . GERD (gastroesophageal reflux disease)  . Hiatal hernia  . HTN (hypertension)  . Hyperlipidemia  . IBS (irritable bowel syndrome)  . Stroke (HCC)  . Vitamin D deficiency  Past Surgical History: Past Surgical History: Procedure Laterality Date . CATARACT EXTRACTION, BILATERAL   . CHOLECYSTECTOMY   . COLONOSCOPY  2005 . ESOPHAGOGASTRODUODENOSCOPY (EGD) WITH PROPOFOL N/A 11/28/2017  Procedure: ESOPHAGOGASTRODUODENOSCOPY (EGD) WITH PROPOFOL;  Surgeon: Graylin Shiver, MD;  Location[facility] ENDOSCOPY;  Service: Endoscopy;  Laterality: N/A; . TONSILLECTOMY   . UPPER GASTROINTESTINAL ENDOSCOPY  2005 HPI: Pt is a 86 yo female presenting after six days of R sided weakness. MRI revealed an acute lacunar infarct in the L pons. Pt had previous MBS in 2011, recommending nectar thick liquids given aspiration of thin liquids. More recent BSE in 2019 reported normal appearing oropharyngeal swallow but with more symptomatic esophageal dysphagia. PMH includes: large HH, GERD, esophageal stricture, CVA, aphasia, IBS, HLD, HTN, DMII, CKD, Afib, anxiety  Subjective: pt alert, aphasic Assessment / Plan / Recommendation CHL IP CLINICAL IMPRESSIONS 10/14/2019 Clinical Impression Pt presents with what is likely an acute on chronic dysphagia with oropharyngeal component on top of baseline esophageal issues. In addition to documented esophageal hx, she appears to have a small diverticulum in her proximal esophagus (MD not present to confirm) that has mild residue but no backflow into her pharynx. Orally pt has decreased lingual control, with good clearance of boluses but with decreased cohesion. She has impaired coordination as she initiates oral transit and pharyngeal trigger, resulting in frequent aspiration of thin and nectar thick  liquids before and at times during the swallow. This often triggers a cough, but it is too weak to be effective. Penetrates can be cleared with a cued throat clear. Attempted a chin tuck to better contain boluses before swallowing, but this did not improve airway protection with thin liquids. With nectar thick liquids there was a single instance of aspiration as a small amount of penetration reached past the vocal folds, but this could not be replicated across additional challenging. Mild residue at the valleculae is noted given reduced base of tongue retraction, increasing mildly with solids but enough so that she also had an instance of penetration after the swallow with purees. Penetrates reached the vocal folds but were still easily cleared with a cued throat clear. Recommend starting Dys 2 diet and nectar thick liquids with use of a chin tuck and throat clearing post-swallow. Would emphasize the use of aspiration/esophageal precautions and the importance of oral hygiene as well.  SLP Visit Diagnosis Dysphagia, oropharyngeal phase (R13.12) Attention and concentration deficit following --  Frontal lobe and executive function deficit following -- Impact on safety and function Moderate aspiration risk   CHL IP TREATMENT RECOMMENDATION 10/14/2019 Treatment Recommendations Therapy as outlined in treatment plan below   Prognosis 10/14/2019 Prognosis for Safe Diet Advancement Good Barriers to Reach Goals Cognitive deficits;Language deficits;Time post onset Barriers/Prognosis Comment -- [facility]L IP DIET RECOMMENDATION 10/14/2019 SLP Diet Recommendations Dysphagia 2 (Fine chop) solids;Nectar thick liquid Liquid Administration via Straw Medication Administration Crushed with puree Compensations Small sips/bites;Use straw to facilitate chin tuck;Chin tuck;Slow rate;Clear throat intermittently Postural Changes Remain semi-upright after after feeds/meals (Comment);Seated upright at 90 degrees   CHL IP OTHER RECOMMENDATIONS 10/14/2019  Recommended Consults -- Oral Care Recommendations Oral care BID Other Recommendations Order thickener from pharmacy;Prohibited food (jello, ice cream, thin soups);Remove water pitcher   CHL IP FOLLOW UP RECOMMENDATIONS 10/14/2019 Follow up Recommendations Skilled Nursing facility   [facility] IP FREQUENCY AND DURATION 10/14/2019 Speech Therapy Frequency (ACUTE ONLY) min 2x/week Treatment Duration 2 weeks      CHL IP ORAL PHASE 10/14/2019 Oral Phase Impaired Oral - Pudding Teaspoon -- Oral - Pudding Cup -- Oral - Honey Teaspoon -- Oral - Honey Cup -- Oral - Nectar Teaspoon -- Oral - Nectar Cup Premature spillage;Decreased bolus cohesion Oral - Nectar Straw Premature spillage;Decreased bolus cohesion Oral - Thin Teaspoon -- Oral - Thin Cup Premature spillage;Decreased bolus cohesion Oral - Thin Straw Premature spillage;Decreased bolus cohesion Oral - Puree Delayed oral transit Oral - Mech Soft Delayed oral transit Oral - Regular -- Oral - Multi-Consistency -- Oral - Pill -- Oral Phase - Comment --  CHL IP PHARYNGEAL PHASE 10/14/2019 Pharyngeal Phase Impaired Pharyngeal- Pudding Teaspoon -- Pharyngeal -- Pharyngeal- Pudding Cup -- Pharyngeal -- Pharyngeal- Honey Teaspoon -- Pharyngeal -- Pharyngeal- Honey Cup -- Pharyngeal -- Pharyngeal- Nectar Teaspoon -- Pharyngeal -- Pharyngeal- Nectar Cup Reduced tongue base retraction;Delayed swallow initiation-vallecula;Penetration/Aspiration before swallow;Pharyngeal residue - valleculae Pharyngeal Material enters airway, passes BELOW cords and not ejected out despite cough attempt by patient Pharyngeal- Nectar Straw Reduced tongue base retraction;Delayed swallow initiation-vallecula;Penetration/Aspiration before swallow;Pharyngeal residue - valleculae Pharyngeal Material enters airway, passes BELOW cords and not ejected out despite cough attempt by patient Pharyngeal- Thin Teaspoon -- Pharyngeal -- Pharyngeal- Thin Cup Reduced tongue base retraction;Delayed swallow  initiation-vallecula;Penetration/Aspiration before swallow;Pharyngeal residue - valleculae Pharyngeal Material enters airway, passes BELOW cords and not ejected out despite cough attempt by patient Pharyngeal- Thin Straw Reduced tongue base retraction;Delayed swallow initiation-vallecula;Penetration/Aspiration before swallow;Pharyngeal residue - valleculae Pharyngeal Material enters airway, passes BELOW cords and not ejected out despite cough attempt by patient Pharyngeal- Puree Reduced tongue base retraction;Delayed swallow initiation-vallecula;Pharyngeal residue - valleculae;Penetration/Apiration after swallow Pharyngeal Material enters airway, CONTACTS cords and not ejected out Pharyngeal- Mechanical Soft Delayed swallow initiation-vallecula;Reduced tongue base retraction;Pharyngeal residue - valleculae Pharyngeal -- Pharyngeal- Regular -- Pharyngeal -- Pharyngeal- Multi-consistency -- Pharyngeal -- Pharyngeal- Pill -- Pharyngeal -- Pharyngeal Comment --  CHL IP CERVICAL ESOPHAGEAL PHASE 10/14/2019 Cervical Esophageal Phase WFL Pudding Teaspoon -- Pudding Cup -- Honey Teaspoon -- Honey Cup -- Nectar Teaspoon -- Nectar Cup -- Nectar Straw -- Thin Teaspoon -- Thin Cup -- Thin Straw -- Puree -- Mechanical Soft -- Regular -- Multi-consistency -- Pill -- Cervical Esophageal Comment -- Mahala Menghini., M.A. CCC-SLP Acute Rehabilitation Services Pager [phone] Office [phone] 10/14/2019, 3:29 PM              ECHOCARDIOGRAM COMPLETE  Result Date: 10/14/2019    ECHOCARDIOGRAM REPORT   Patient Name:   Lisa Hopkins [facility] Date of Exam: 10/14/2019 Medical  Rec #:  [id number]          Height:       64.0 in Accession #:    [id number]         Weight:       117.0 lb Date of Birth:  09/11/1924          BSA:          1.558 m Patient Age:    89 years           BP:           168/97 mmHg Patient Gender: F                  HR:           73 bpm. Exam Location:  Inpatient Procedure: 2D Echo, Color Doppler and Cardiac Doppler  Indications:    Stroke i163.9  History:        Patient has prior history of Echocardiogram examinations, most                 recent 12/01/2017. CHF, Arrythmias:Atrial Fibrillation; Risk                 Factors:Hypertension, Diabetes and Dyslipidemia.  Sonographer:    Irving Burton Senior RDCS Referring Phys: [id number] ARSHAD N KAKRAKANDY IMPRESSIONS  1. Left ventricular ejection fraction, by estimation, is 65 to 70%. The left ventricle has normal function. The left ventricle has no regional wall motion abnormalities. There is mild left ventricular hypertrophy. Left ventricular diastolic function could not be evaluated.  2. Right ventricular systolic function is low normal. The right ventricular size is normal.  3. Left atrial size was mildly dilated.  4. The mitral valve is abnormal. Mild mitral valve regurgitation.  5. The aortic valve is tricuspid. Aortic valve regurgitation is not visualized. Mild to moderate aortic valve sclerosis/calcification is present, without any evidence of aortic stenosis. FINDINGS  Left Ventricle: Left ventricular ejection fraction, by estimation, is 65 to 70%. The left ventricle has normal function. The left ventricle has no regional wall motion abnormalities. The left ventricular internal cavity size was normal in size. There is  mild left ventricular hypertrophy. Left ventricular diastolic function could not be evaluated due to atrial fibrillation. Left ventricular diastolic function could not be evaluated. Indeterminate filling pressures. Right Ventricle: The right ventricular size is normal. No increase in right ventricular wall thickness. Right ventricular systolic function is low normal. Left Atrium: Left atrial size was mildly dilated. Right Atrium: Right atrial size was normal in size. Pericardium: There is no evidence of pericardial effusion. Mitral Valve: The mitral valve is abnormal. Mild mitral annular calcification. Mild mitral valve regurgitation. Tricuspid Valve: The tricuspid valve is  grossly normal. Tricuspid valve regurgitation is mild. Aortic Valve: The aortic valve is tricuspid. Aortic valve regurgitation is not visualized. Mild to moderate aortic valve sclerosis/calcification is present, without any evidence of aortic stenosis. Pulmonic Valve: The pulmonic valve was normal in structure. Pulmonic valve regurgitation is not visualized. Aorta: The aortic root and ascending aorta are structurally normal, with no evidence of dilitation. Venous: The inferior vena cava was not well visualized. IAS/Shunts: No atrial level shunt detected by color flow Doppler.  LEFT VENTRICLE PLAX 2D LVIDd:         3.30 cm  Diastology LVIDs:         2.20 cm  LV e' lateral: 7.38 cm/s LV PW:         1.00 cm  LV e'  medial:  7.46 cm/s LV IVS:        1.00 cm LVOT diam:     2.20 cm LV SV:         48 LV SV Index:   31 LVOT Area:     3.80 cm  RIGHT VENTRICLE RV S prime:     9.07 cm/s TAPSE (M-mode): 1.5 cm LEFT ATRIUM             Index       RIGHT ATRIUM           Index LA diam:        3.10 cm 1.99 cm/m  RA Area:     16.20 cm LA Vol (A2C):   48.0 ml 30.81 ml/m RA Volume:   35.90 ml  23.05 ml/m LA Vol (A4C):   56.6 ml 36.33 ml/m LA Biplane Vol: 53.5 ml 34.34 ml/m  AORTIC VALVE LVOT Vmax:   60.70 cm/s LVOT Vmean:  41.300 cm/s LVOT VTI:    0.126 m  AORTA Ao Root diam: 3.20 cm TRICUSPID VALVE TR Peak grad:   26.0 mmHg TR Vmax:        255.00 cm/s  SHUNTS Systemic VTI:  0.13 m Systemic Diam: 2.20 cm Zoila Shutter MD Electronically signed by Zoila Shutter MD Signature Date/Time: 10/14/2019/10:53:01 AM    Final     Scheduled Meds: .  stroke: mapping our early stages of recovery book   Does not apply Once  . insulin aspart  0-6 Units Subcutaneous Q4H  . mouth rinse  15 mL Mouth Rinse BID  . warfarin  2 mg Oral ONCE-1600  . Warfarin - Pharmacist Dosing Inpatient   Does not apply q1600    Continuous Infusions: . sodium chloride Stopped (10/14/19 0458)  . cefTRIAXone (ROCEPHIN)  IV 1 g (10/14/19 0616)     LOS: 1  day     Darlin Drop, MD [facility] Pager [phone]  If 7PM-7AM, please contact night-coverage [email or web address] Password [facility] 10/14/2019, 3:44 PM

## 2019-10-14 NOTE — Progress Notes (Signed)
ANTICOAGULATION CONSULT NOTE - Initial Consult  Pharmacy Consult for warfarin Indication: atrial fibrillation  Allergies  Allergen Reactions  . Ace Inhibitors Cough  . Fosamax [Alendronate Sodium] Other (See Comments)    Heart burn  . Norvasc [Amlodipine Besylate] Other (See Comments)    edema  . Zocor [Simvastatin] Other (See Comments)    Elevated CPK    Patient Measurements: Height: 5\' 4"  (162.6 cm) Weight: 53.1 kg (117 lb) IBW/kg (Calculated) : 54.7   Vital Signs: Temp: 97.4 F (36.3 C) (05/18 0817) Temp Source: Oral (05/18 0817) BP: 168/97 (05/18 0817) Pulse Rate: 63 (05/18 0817)  Labs: Recent Labs    10/13/19 1109 10/14/19 0548  HGB 15.0 14.5  HCT 47.7* 44.8  PLT 264 273  APTT 43*  --   LABPROT 24.5*  --   INR 2.3*  --   CREATININE 0.98 0.87    Estimated Creatinine Clearance: 34.6 mL/min (by C-G formula based on SCr of 0.87 mg/dL).   Medical History: Past Medical History:  Diagnosis Date  . Anxiety   . Aphasia as late effect of cerebrovascular accident   . Atrial fibrillation (HCC)   . CKD (chronic kidney disease), stage II   . CVA (cerebral infarction)   . Diabetes mellitus type II   . DVT, lower extremity, distal, acute, right (HCC) 03/25/2018   Right peroneal vein 03/22/2018  . GERD (gastroesophageal reflux disease)   . Hiatal hernia   . HTN (hypertension)   . Hyperlipidemia   . IBS (irritable bowel syndrome)   . Stroke (HCC)   . Vitamin D deficiency      Assessment: 86 yo female with h/o CVA and afib brought to ER for right sided weakness. Found to have left pontine infarct and right semiovate infarct. Pharmacy consulted to resume warfarin. Home dose is 2mg  daily except 1mg  on Mondays and Fridays. INR on admit was 2.3 on 5/17 with last dose of warfarin given on 5/17.   Goal of Therapy:  INR 2-3 Monitor platelets by anticoagulation protocol: Yes   Plan:  Warfarin 2mg  PO x 1 at 1600 Daily INR Monitor bleeding  Chrisotpher Rivero A. 10-16-1987,  PharmD, BCPS, FNKF Clinical Pharmacist [place] Please utilize Amion for appropriate phone number to reach the unit pharmacist [facility])   10/14/2019,9:01 AM

## 2019-10-14 NOTE — Progress Notes (Signed)
PT Cancellation Note  Patient Details Name: Lisa Hopkins MRN: [id number] DOB: [birth year]   Cancelled Treatment:    Reason Eval/Treat Not Completed: Patient at procedure or test/unavailable  Transporter arrived to take patient for swallow study. Will attempt to see this pm.   Jerolyn Center, PT Pager [phone] Zena Amos 10/14/2019, 11:52 AM

## 2019-10-14 NOTE — Progress Notes (Signed)
Modified Barium Swallow Progress Note  Patient Details  Name: Lisa Hopkins MRN: [id number] Date of Birth: 11/21/1924  Today's Date: 10/14/2019  Modified Barium Swallow completed.  Full report located under Chart Review in the Imaging Section.  Brief recommendations include the following:  Clinical Impression   Pt presents with what is likely an acute on chronic dysphagia with oropharyngeal component on top of baseline esophageal issues. In addition to documented esophageal hx, she appears to have a small diverticulum in her proximal esophagus (MD not present to confirm) that has mild residue but no backflow into her pharynx. Orally pt has decreased lingual control, with good clearance of boluses but with decreased cohesion. She has impaired coordination as she initiates oral transit and pharyngeal trigger, resulting in frequent aspiration of thin and nectar thick liquids before and at times during the swallow. This often triggers a cough, but it is too weak to be effective. Penetrates can be cleared with a cued throat clear. Attempted a chin tuck to better contain boluses before swallowing, but this did not improve airway protection with thin liquids. With nectar thick liquids there was a single instance of aspiration as a small amount of penetration reached past the vocal folds, but this could not be replicated across additional challenging. Mild residue at the valleculae is noted given reduced base of tongue retraction, increasing mildly with solids but enough so that she also had an instance of penetration after the swallow with purees. Penetrates reached the vocal folds but were still easily cleared with a cued throat clear. Recommend starting Dys 2 diet and nectar thick liquids with use of a chin tuck and throat clearing post-swallow. Would emphasize the use of aspiration/esophageal precautions and the importance of oral hygiene as well.    Swallow Evaluation Recommendations       SLP  Diet Recommendations: Dysphagia 2 (Fine chop) solids;Nectar thick liquid   Liquid Administration via: Straw   Medication Administration: Crushed with puree   Supervision: Staff to assist with self feeding;Full supervision/cueing for compensatory strategies   Compensations: Small sips/bites;Use straw to facilitate chin tuck;Chin tuck;Slow rate;Clear throat intermittently   Postural Changes: Remain semi-upright after after feeds/meals (Comment);Seated upright at 90 degrees   Oral Care Recommendations: Oral care BID   Other Recommendations: Order thickener from pharmacy;Prohibited food (jello, ice cream, thin soups);Remove water pitcher     Mahala Menghini., M.A. CCC-SLP Acute Rehabilitation Services Pager [phone] Office [phone]  10/14/2019,3:21 PM

## 2019-10-14 NOTE — Evaluation (Signed)
Clinical/Bedside Swallow Evaluation Patient Details  Name: Lisa Hopkins MRN: [id number] Date of Birth: [birth year]  Today's Date: 10/14/2019 Time: SLP Start Time (ACUTE ONLY): 0840 SLP Stop Time (ACUTE ONLY): 0855 SLP Time Calculation (min) (ACUTE ONLY): 15 min  Past Medical History:  Past Medical History:  Diagnosis Date  . Anxiety   . Aphasia as late effect of cerebrovascular accident   . Atrial fibrillation (HCC)   . CKD (chronic kidney disease), stage II   . CVA (cerebral infarction)   . Diabetes mellitus type II   . DVT, lower extremity, distal, acute, right (HCC) 03/25/2018   Right peroneal vein 03/22/2018  . GERD (gastroesophageal reflux disease)   . Hiatal hernia   . HTN (hypertension)   . Hyperlipidemia   . IBS (irritable bowel syndrome)   . Stroke (HCC)   . Vitamin D deficiency    Past Surgical History:  Past Surgical History:  Procedure Laterality Date  . CATARACT EXTRACTION, BILATERAL    . CHOLECYSTECTOMY    . COLONOSCOPY  2005  . ESOPHAGOGASTRODUODENOSCOPY (EGD) WITH PROPOFOL N/A 11/28/2017   Procedure: ESOPHAGOGASTRODUODENOSCOPY (EGD) WITH PROPOFOL;  Surgeon: Graylin Shiver, MD;  Location[facility] ENDOSCOPY;  Service: Endoscopy;  Laterality: N/A;  . TONSILLECTOMY    . UPPER GASTROINTESTINAL ENDOSCOPY  2005   HPI:  Pt is a 86 yo female presenting after six days of R sided weakness. MRI revealed an acute lacunar infarct in the L pons. Pt had previous MBS in 2011, recommending nectar thick liquids given aspiration of thin liquids. More recent BSE in 2019 reported normal appearing oropharyngeal swallow but with more symptomatic esophageal dysphagia. PMH includes: large HH, GERD, esophageal stricture, CVA, aphasia, IBS, HLD, HTN, DMII, CKD, Afib, anxiety   Assessment / Plan / Recommendation Clinical Impression   Pt has signs of dysphagia that could be multifactorial in nature. Several of her symptoms appear to be more esophageal in nature, and could be related to more  chronic issues that she has at baseline. There is frequent eructation and intermittent grimacing, consistent with most recent clinical swallow evaluation in 2019. However, in light of acute pontine stroke, pt also demonstrates mild R-sided lingual weakness with subtle deviation and incomplete lateralization. She is also coughing consistently and for a prolonged duration with small cup sips of water. Pt's cough appears to be weak, and although she acknowledges a more chronic dysphagia, she has a difficult time trying to determine if any of her symptoms are more acute. Discussed MBS as an option to get a closer look at oropharyngeal swallow function; pt is in agreement. MBS is tentatively planned for later this morning - discussed with RN/MD the need for essential meds that could be given crushed in puree prior to completion.   SLP Visit Diagnosis: Dysphagia, unspecified (R13.10)    Aspiration Risk  Mild aspiration risk;Moderate aspiration risk    Diet Recommendation NPO except meds;Ice chips PRN after oral care   Medication Administration: Crushed with puree    Other  Recommendations Oral Care Recommendations: Oral care QID   Follow up Recommendations        Frequency and Duration            Prognosis Prognosis for Safe Diet Advancement: Good Barriers to Reach Goals: Cognitive deficits;Language deficits      Swallow Study   General HPI: Pt is a 86 yo female presenting after six days of R sided weakness. MRI revealed an acute lacunar infarct in the L pons. Pt had previous  MBS in 2011, recommending nectar thick liquids given aspiration of thin liquids. More recent BSE in 2019 reported normal appearing oropharyngeal swallow but with more symptomatic esophageal dysphagia. PMH includes: large HH, GERD, esophageal stricture, CVA, aphasia, IBS, HLD, HTN, DMII, CKD, Afib, anxiety Type of Study: Bedside Swallow Evaluation Previous Swallow Assessment: see HPI Diet Prior to this Study:  NPO Temperature Spikes Noted: No Respiratory Status: Room air History of Recent Intubation: No Behavior/Cognition: Alert;Cooperative;Pleasant mood;Requires cueing Oral Cavity Assessment: Within Functional Limits Oral Care Completed by SLP: No Oral Cavity - Dentition: Adequate natural dentition Vision: Functional for self-feeding Self-Feeding Abilities: Needs assist Patient Positioning: Upright in bed Baseline Vocal Quality: Low vocal intensity Volitional Cough: Weak Volitional Swallow: Unable to elicit    Oral/Motor/Sensory Function Overall Oral Motor/Sensory Function: Mild impairment Facial ROM: Within Functional Limits Facial Symmetry: Within Functional Limits Facial Strength: Within Functional Limits Lingual ROM: Reduced left;Suspected CN XII (hypoglossal) dysfunction Lingual Symmetry: Abnormal symmetry right(subtle deviation to her R) Lingual Strength: Reduced Velum: Within Functional Limits Mandible: Within Functional Limits   Ice Chips Ice chips: Within functional limits Presentation: Spoon   Thin Liquid Thin Liquid: Impaired Presentation: Cup;Self Fed;Spoon Pharyngeal  Phase Impairments: Cough - Immediate;Throat Clearing - Immediate    Nectar Thick Nectar Thick Liquid: Not tested   Honey Thick Honey Thick Liquid: Not tested   Puree Puree: Impaired Presentation: Spoon Pharyngeal Phase Impairments: Multiple swallows;Throat Clearing - Immediate;Cough - Delayed   Solid     Solid: Not tested       Osie Bond., M.A. [address] Pager [phone] Office [phone]  10/14/2019,9:21 AM

## 2019-10-14 NOTE — Plan of Care (Signed)
  Problem: Education: Goal: Knowledge of disease or condition will improve Outcome: Progressing Goal: Knowledge of secondary prevention will improve Outcome: Progressing Goal: Knowledge of patient specific risk factors addressed and post discharge goals established will improve Outcome: Progressing   Problem: Coping: Goal: Will verbalize positive feelings about self Outcome: Progressing Goal: Will identify appropriate support needs Outcome: Progressing   Problem: Health Behavior/Discharge Planning: Goal: Ability to manage health-related needs will improve Outcome: Progressing   Problem: Self-Care: Goal: Ability to participate in self-care as condition permits will improve Outcome: Progressing   Problem: Nutrition: Goal: Risk of aspiration will decrease Outcome: Progressing

## 2019-10-14 NOTE — Evaluation (Signed)
Occupational Therapy Evaluation Patient Details Name: Lisa Hopkins MRN: [id number] DOB: [birth year] Today's Date: 10/14/2019    History of Present Illness 86 y.o. female with history of CVA (residual aphasia) A. fib hypertension diabetes mellitus DVT RLE was brought to the ER 10/13/19 after patient was having persistent right-sided weakness since Oct 07, 2019. She saw PCP, prescribed prednisone for possible pinched nerve. Started developing weakness of the right upper extremity. MRI brain acute left pontine infarct and also infarct in the right semiovale.   Clinical Impression   PTA patient used RW for mobility and required some assist for ADLs, living at [facility] independent [facility] facility.  Patient admitted for above and limited by problem list below, including R sided weakness, impaired balance, impaired cognition and safety, and decreased activity tolerance.  Noted aphasia at baseline, but pts son reports worse since admission.  Pt pleasant and cooperative, follows 1 step commands with increased time, with more difficulty expressing needs vs following commands, but difficulty sequencing multistep tasks (managing RW and ambulating/cueing to sequence hand washing) and question ideational apraxia (attempting to bring soap to mouth).  Patient requires min assist +2 for transfers and limited in room mobility, min-total assist for ADLs.  She will benefit from continued OT services while admitted and after dc at SNF level to decrease burden of care and optimize return to PLOF with ADLs and mobility.  Will follow.     Follow Up Recommendations  SNF;Supervision/Assistance - 24 hour    Equipment Recommendations  Other (comment)(TBD at next venue of care )    Recommendations for Other Services       Precautions / Restrictions Precautions Precautions: Fall Precaution Comments: aphasia at baseline  Restrictions Weight Bearing Restrictions: No      Mobility Bed Mobility Overal bed  mobility: Needs Assistance Bed Mobility: Supine to Sit;Sit to Supine     Supine to sit: Min assist;+2 for safety/equipment Sit to supine: Mod assist;+2 for physical assistance;+2 for safety/equipment   General bed mobility comments: min assist for trunk and scooting foward, return to supine with assist for trunk control and LB elevation to supine; increased time for sequencing and cueing for technique   Transfers Overall transfer level: Needs assistance Equipment used: Rolling walker (2 wheeled);2 person hand held assist Transfers: Sit to/from [employer] Transfers Sit to Stand: Min assist;+2 physical assistance;+2 safety/equipment Stand pivot transfers: Min assist;+2 physical assistance;+2 safety/equipment       General transfer comment: min assist +2 to power up and steady with cueing for hand placement, improved to min guard +2 sit to stand with use of RW vs HHA     Balance Overall balance assessment: Needs assistance Sitting-balance support: No upper extremity supported;Feet supported Sitting balance-Leahy Scale: Fair     Standing balance support: Bilateral upper extremity supported;During functional activity;No upper extremity supported Standing balance-Leahy Scale: Poor Standing balance comment: relies on RW or UE support, R lateral lean and slight posterior lean with grooming tasks 0 hand support                            ADL either performed or assessed with clinical judgement   ADL Overall ADL's : Needs assistance/impaired     Grooming: Wash/dry hands;Minimal assistance;Standing Grooming Details (indicate cue type and reason): assist to maintain balance, sequence and utilize soap correctly (question ideational apraxia)  Upper Body Bathing: Sitting;Minimal assistance   Lower Body Bathing: Maximal assistance;Sit to/from stand;+2 for physical assistance;+2 for  safety/equipment   Upper Body Dressing : Moderate assistance;Sitting   Lower Body  Dressing: +2 for physical assistance;+2 for safety/equipment;Sit to/from stand;Total assistance   Toilet Transfer: Minimal assistance;+2 for physical assistance;+2 for safety/equipment;Stand-pivot;BSC   Toileting- Clothing Manipulation and Hygiene: Moderate assistance;+2 for physical assistance;+2 for safety/equipment;Sit to/from stand Toileting - Clothing Manipulation Details (indicate cue type and reason): for clothing mgmt and balance     Functional mobility during ADLs: Minimal assistance;Moderate assistance;+2 for physical assistance;+2 for safety/equipment;Rolling walker;Cueing for safety;Cueing for sequencing General ADL Comments: pt limited by R sided weakness, impaired balance, decrased activity tolerance and impaired cognition     Vision Patient Visual Report: No change from baseline       Perception     Praxis Praxis Praxis tested?: Deficits Deficits: Ideation Praxis-Other Comments: question ideational apraxia     Pertinent Vitals/Pain Pain Assessment: Faces Faces Pain Scale: No hurt     Hand Dominance Right   Extremity/Trunk Assessment Upper Extremity Assessment Upper Extremity Assessment: Generalized weakness;RUE deficits/detail RUE Deficits / Details: grossly weaker than L UE (3-/5 MMT) with AAROM approx 90 FF, sensation WFL, limited coordination  RUE Sensation: decreased proprioception(decreased awareness of UE when returning to bed) RUE Coordination: decreased gross motor;decreased fine motor   Lower Extremity Assessment Lower Extremity Assessment: Defer to PT evaluation       Communication Communication Communication: HOH;Expressive difficulties   Cognition Arousal/Alertness: Awake/alert Behavior During Therapy: WFL for tasks assessed/performed Overall Cognitive Status: No family/caregiver present to determine baseline cognitive functioning Area of Impairment: Following commands;Safety/judgement;Awareness;Problem solving                        Following Commands: Follows one step commands consistently;Follows one step commands with increased time;Follows multi-step commands inconsistently Safety/Judgement: Decreased awareness of safety;Decreased awareness of deficits Awareness: Intellectual Problem Solving: Slow processing;Requires verbal cues;Difficulty sequencing;Requires tactile cues General Comments: patient follows simple commands with increased time, requires multimodal cueing and cueing for safety awareness; question ideational apraxia (bringing soap to mouth)     General Comments  son present and supportive     Exercises     Shoulder Instructions      Home Living Family/patient expects to be discharged to:: Skilled nursing facility     Type of Home: Independent living facility                           Additional Comments: from [facility], son reports looking into ALF       Prior Functioning/Environment Level of Independence: Needs assistance  Gait / Transfers Assistance Needed: reports using RW for mobility  ADL's / Homemaking Assistance Needed: reports needing assist for ADLs, IADLs    Comments: limited historian, initally reports dressing self but assist with bathing but then reports requires assist with dressing as well        OT Problem List: Decreased strength;Decreased activity tolerance;Decreased range of motion;Impaired balance (sitting and/or standing);Decreased coordination;Decreased cognition;Decreased safety awareness;Decreased knowledge of use of DME or AE;Decreased knowledge of precautions;Impaired UE functional use      OT Treatment/Interventions: Self-care/ADL training;DME and/or AE instruction;Neuromuscular education;Therapeutic activities;Cognitive remediation/compensation;Patient/family education;Balance training    OT Goals(Current goals can be found in the care plan section) Acute Rehab OT Goals Patient Stated Goal: to get some food  OT Goal Formulation:  With patient Time For Goal Achievement: 10/28/19 Potential to Achieve Goals: Good  OT Frequency: Min 2X/week   Barriers to D/C:  Co-evaluation PT/OT/SLP Co-Evaluation/Treatment: Yes Reason for Co-Treatment: Necessary to address cognition/behavior during functional activity;For patient/therapist safety;To address functional/ADL transfers;Other (comment)(activity tolerance)   OT goals addressed during session: ADL's and self-care      AM-PAC OT "6 Clicks" Daily Activity     Outcome Measure Help from another person eating meals?: A Little Help from another person taking care of personal grooming?: A Little Help from another person toileting, which includes using toliet, bedpan, or urinal?: A Lot Help from another person bathing (including washing, rinsing, drying)?: A Lot Help from another person to put on and taking off regular upper body clothing?: A Lot Help from another person to put on and taking off regular lower body clothing?: Total 6 Click Score: 13   End of Session Equipment Utilized During Treatment: Gait belt;Rolling walker Nurse Communication: Mobility status;Precautions;Other (comment)(requesting lunch)  Activity Tolerance: Patient tolerated treatment well Patient left: in bed;with call bell/phone within reach;with bed alarm set;with family/visitor present  OT Visit Diagnosis: Other abnormalities of gait and mobility (R26.89);Muscle weakness (generalized) (M62.81);Other symptoms and signs involving the nervous system (R29.898);Cognitive communication deficit (R41.841) Symptoms and signs involving cognitive functions: Cerebral infarction                Time: 1610-9604 OT Time Calculation (min): 25 min Charges:  OT General Charges $OT Visit: 1 Visit OT Evaluation $OT Eval Moderate Complexity: 1 Mod  Barry Brunner, OT Acute Rehabilitation Services Pager [phone] Office [phone]   Chancy Milroy 10/14/2019, 2:01 PM

## 2019-10-15 LAB — GLUCOSE, CAPILLARY
Glucose-Capillary: 102 mg/dL — ABNORMAL HIGH (ref 70–99)
Glucose-Capillary: 108 mg/dL — ABNORMAL HIGH (ref 70–99)
Glucose-Capillary: 111 mg/dL — ABNORMAL HIGH (ref 70–99)
Glucose-Capillary: 133 mg/dL — ABNORMAL HIGH (ref 70–99)
Glucose-Capillary: 143 mg/dL — ABNORMAL HIGH (ref 70–99)
Glucose-Capillary: 99 mg/dL (ref 70–99)

## 2019-10-15 LAB — CBC WITH DIFFERENTIAL/PLATELET
Abs Immature Granulocytes: 0.04 10*3/uL (ref 0.00–0.07)
Basophils Absolute: 0 10*3/uL (ref 0.0–0.1)
Basophils Relative: 1 %
Eosinophils Absolute: 0.2 10*3/uL (ref 0.0–0.5)
Eosinophils Relative: 2 %
HCT: 41.5 % (ref 36.0–46.0)
Hemoglobin: 13.2 g/dL (ref 12.0–15.0)
Immature Granulocytes: 1 %
Lymphocytes Relative: 38 %
Lymphs Abs: 3.1 10*3/uL (ref 0.7–4.0)
MCH: 30.1 pg (ref 26.0–34.0)
MCHC: 31.8 g/dL (ref 30.0–36.0)
MCV: 94.7 fL (ref 80.0–100.0)
Monocytes Absolute: 0.7 10*3/uL (ref 0.1–1.0)
Monocytes Relative: 8 %
Neutro Abs: 4.2 10*3/uL (ref 1.7–7.7)
Neutrophils Relative %: 50 %
Platelets: 257 10*3/uL (ref 150–400)
RBC: 4.38 MIL/uL (ref 3.87–5.11)
RDW: 14.1 % (ref 11.5–15.5)
WBC: 8.2 10*3/uL (ref 4.0–10.5)
nRBC: 0 % (ref 0.0–0.2)

## 2019-10-15 LAB — PROTIME-INR
INR: 2.4 — ABNORMAL HIGH (ref 0.8–1.2)
Prothrombin Time: 25.1 seconds — ABNORMAL HIGH (ref 11.4–15.2)

## 2019-10-15 LAB — HEMOGLOBIN A1C
Hgb A1c MFr Bld: 6.2 % — ABNORMAL HIGH (ref 4.8–5.6)
Mean Plasma Glucose: 131 mg/dL

## 2019-10-15 LAB — BASIC METABOLIC PANEL
Anion gap: 9 (ref 5–15)
BUN: 20 mg/dL (ref 8–23)
CO2: 24 mmol/L (ref 22–32)
Calcium: 8.8 mg/dL — ABNORMAL LOW (ref 8.9–10.3)
Chloride: 106 mmol/L (ref 98–111)
Creatinine, Ser: 0.85 mg/dL (ref 0.44–1.00)
GFR calc Af Amer: >60 mL/min (ref >60)
GFR calc non Af Amer: 59 mL/min — ABNORMAL LOW (ref >60)
Glucose, Bld: 103 mg/dL — ABNORMAL HIGH (ref 70–99)
Potassium: 3.6 mmol/L (ref 3.5–5.1)
Sodium: 139 mmol/L (ref 135–145)

## 2019-10-15 LAB — MAGNESIUM: Magnesium: 1.9 mg/dL (ref 1.7–2.4)

## 2019-10-15 LAB — PHOSPHORUS: Phosphorus: 3.8 mg/dL (ref 2.5–4.6)

## 2019-10-15 MED ORDER — WARFARIN SODIUM 2 MG PO TABS
2.0000 mg | ORAL_TABLET | Freq: Once | ORAL | Status: AC
  Administered 2019-10-15: 2 mg via ORAL
  Filled 2019-10-15: qty 1

## 2019-10-15 MED ORDER — MELATONIN 3 MG PO TABS: 3.0000 mg | ORAL_TABLET | Freq: Every day | ORAL | Status: DC

## 2019-10-15 MED ORDER — QUETIAPINE FUMARATE 25 MG PO TABS
12.5000 mg | ORAL_TABLET | Freq: Every day | ORAL | Status: DC
  Administered 2019-10-15 – 2019-10-16 (×2): 12.5 mg via ORAL
  Filled 2019-10-15 (×2): qty 1

## 2019-10-15 MED ORDER — MELATONIN 3 MG PO TABS
3.0000 mg | ORAL_TABLET | Freq: Every day | ORAL | Status: DC
  Administered 2019-10-16: 3 mg via ORAL
  Filled 2019-10-15: qty 1

## 2019-10-15 NOTE — NC FL2 (Signed)
[place] MEDICAID FL2 LEVEL OF CARE SCREENING TOOL     IDENTIFICATION  Patient Name: Lisa Hopkins Birthdate: 07-22-1924 Sex: female Admission Date (Current Location): 10/13/2019  [facility] and [place] Number:  Producer, television/film/video and Address:  The . [facility], [address] [place], [place] [address]      Provider Number: [id number]  Attending Physician Name and Address:  Lorin Glass, MD  Relative Name and Phone Number:  Dereona Kolodny [phone]    Current Level of Care: Hospital Recommended Level of Care: Skilled Nursing Facility Prior Approval Number:    Date Approved/Denied:   PASRR Number: [id number]  Discharge Plan: SNF    Current Diagnoses: Patient Active Problem List   Diagnosis Date Noted  . Acute CVA (cerebrovascular accident) (HCC) 10/13/2019  . Vascular dementia without behavioral disturbance (HCC) 12/31/2018  . Protein-calorie malnutrition, severe 06/20/2018  . Encounter for general adult medical examination with abnormal findings 12/21/2017  . Hiatal hernia 12/19/2017  . CKD stage 2 due to type 2 diabetes mellitus (HCC) 08/02/2017  . Diet-controlled type 2 diabetes mellitus (HCC) 12/25/2016  . Atherosclerosis of aorta (HCC) 12/03/2015  . GERD 09/25/2014  . Diastolic CHF (HCC) 08/04/2014  . Hyperlipidemia associated with type 2 diabetes mellitus (HCC)   . Vitamin D deficiency   . Anxiety state 03/15/2010  . Essential hypertension 03/15/2010  . Atrial fibrillation (HCC) 03/15/2010  . APHASIA DUE TO CEREBROVASCULAR DISEASE 03/15/2010  . History of CVA (cerebrovascular accident) 03/07/2010    Orientation RESPIRATION BLADDER Height & Weight     Self, Situation, Place  Normal External catheter Weight: 117 lb (53.1 kg) Height:  5\' 4"  (162.6 cm)  BEHAVIORAL SYMPTOMS/MOOD NEUROLOGICAL BOWEL NUTRITION STATUS      Incontinent Diet(See discharge summary)  AMBULATORY STATUS COMMUNICATION OF NEEDS Skin   Extensive Assist  Verbally Normal                       Personal Care Assistance Level of Assistance  Bathing, Feeding, Dressing Bathing Assistance: Maximum assistance Feeding assistance: Independent Dressing Assistance: Maximum assistance     Functional Limitations Info  Sight, Hearing, Speech Sight Info: Adequate   Speech Info: Adequate    SPECIAL CARE FACTORS FREQUENCY  PT (By licensed PT), OT (By licensed OT), Speech therapy     PT Frequency: 5x week OT Frequency: 5x week     Speech Therapy Frequency: 5x week      Contractures Contractures Info: Not present    Additional Factors Info  Code Status, Allergies, Insulin Sliding Scale Code Status Info: DNR Allergies Info: Ace Inhibitors, Fosamax, Norvasc, Zocor   Insulin Sliding Scale Info: Insulin Aspart (Novolog) 0-6 U every 4 hours       Current Medications (10/15/2019):  This is the current hospital active medication list Current Facility-Administered Medications  Medication Dose Route Frequency Provider Last Rate Last Admin  .  stroke: mapping our early stages of recovery book   Does not apply Once 10/17/2019, MD   Stopped at 10/13/19 2300  . 0.9 %  sodium chloride infusion   Intravenous Continuous 10/15/19, DO 50 mL/hr at 10/14/19 2036 New Bag at 10/14/19 2036  . acetaminophen (TYLENOL) tablet 650 mg  650 mg Oral Q4H PRN 2037, MD   650 mg at 10/15/19 0612   Or  . acetaminophen (TYLENOL) 160 MG/5ML solution 650 mg  650 mg Per Tube Q4H PRN 10/17/19, MD  Or  . acetaminophen (TYLENOL) suppository 650 mg  650 mg Rectal Q4H PRN Rise Patience, MD      . aspirin EC tablet 81 mg  81 mg Oral Daily Rosalin Hawking, MD   81 mg at 10/15/19 1100  . atorvastatin (LIPITOR) tablet 20 mg  20 mg Oral Daily Rosalin Hawking, MD   20 mg at 10/15/19 1100  . cefTRIAXone (ROCEPHIN) 1 g in sodium chloride 0.9 % 100 mL IVPB  1 g Intravenous Q24H Rise Patience, MD 200 mL/hr at 10/15/19 0601 1 g at  10/15/19 0601  . hydrALAZINE (APRESOLINE) injection 10 mg  10 mg Intravenous Q4H PRN Rise Patience, MD      . insulin aspart (novoLOG) injection 0-6 Units  0-6 Units Subcutaneous Q4H Rise Patience, MD   1 Units at 10/14/19 2035  . MEDLINE mouth rinse  15 mL Mouth Rinse BID Irene Pap N, DO   15 mL at 10/15/19 1100  . metoprolol tartrate (LOPRESSOR) tablet 12.5 mg  12.5 mg Oral BID Irene Pap N, DO   12.5 mg at 10/15/19 1100  . Resource ThickenUp Clear   Oral PRN Irene Pap N, DO      . warfarin (COUMADIN) tablet 2 mg  2 mg Oral ONCE-1600 Pierce, Dwayne A, RPH      . Warfarin - Pharmacist Dosing Inpatient   Does not apply [id number] Theotis Burrow [facility]   Given at 10/14/19 1737     Discharge Medications: Please see discharge summary for a list of discharge medications.  Relevant Imaging Results:  Relevant Lab Results:   Additional Information SS# [place], [place] Work

## 2019-10-15 NOTE — TOC Initial Note (Cosign Needed Addendum)
Transition of Care [facility]) - Initial/Assessment Note    Patient Details  Name: Lisa Hopkins MRN: [id number] Date of Birth: [birth year]  [facility] CM/SW Contact:    Terrilee Croak, Student-Social Work Phone Number: 10/15/2019, 12:57 PM  Clinical Narrative:                 MSW Intern spoke with pt's son, [name], who confirmed that they are interested in SNF before pt can go back to independent living facility. He noted he had no preferences and agreed for information to be faxed out. FL2 and fax out completed. Will follow up with son for bed choice and discharge planning. Addendum 1:17 5/19 Regina from [place] reached out to SW for updates and requested to continue being updated. [facility] worked with Pt at [employer].and will continue to follow.  Expected Discharge Plan: Skilled Nursing Facility Barriers to Discharge: Continued Medical Work up   Patient Goals and CMS Choice Patient states their goals for this hospitalization and ongoing recovery are:: Pt's family is agreeable to SNF before she returns to ALF facility. CMS Medicare.gov Compare Post Acute Care list provided to:: Patient Represenative (must comment)([name] Fogelman, son) Choice offered to / list presented to : Adult Children  Expected Discharge Plan and Services Expected Discharge Plan: Skilled Nursing Facility       Living arrangements for the past 2 months: Independent Living Facility                                      Prior Living Arrangements/Services Living arrangements for the past 2 months: Independent Living Facility Lives with:: Facility Resident Patient language and need for interpreter reviewed:: Yes Do you feel safe going back to the place where you live?: Yes      Need for Family Participation in Patient Care: Yes (Comment) Care giver support system in place?: Yes (comment)   Criminal Activity/Legal Involvement Pertinent to Current Situation/Hospitalization: No - Comment as  needed  Activities of Daily Living Home Assistive Devices/Equipment: None ADL Screening (condition at time of admission) Patient's cognitive ability adequate to safely complete daily activities?: Yes Is the patient deaf or have difficulty hearing?: No Does the patient have difficulty seeing, even when wearing glasses/contacts?: No Does the patient have difficulty concentrating, remembering, or making decisions?: Yes Patient able to express need for assistance with ADLs?: Yes Does the patient have difficulty dressing or bathing?: Yes Independently performs ADLs?: No Communication: Independent Dressing (OT): Needs assistance Is this a change from baseline?: Pre-admission baseline Grooming: Needs assistance Is this a change from baseline?: Pre-admission baseline Feeding: Independent Bathing: Needs assistance Is this a change from baseline?: Pre-admission baseline Toileting: Needs assistance Is this a change from baseline?: Pre-admission baseline In/Out Bed: Needs assistance Is this a change from baseline?: Pre-admission baseline Walks in Home: Needs assistance Is this a change from baseline?: Change from baseline, expected to last >3 days Does the patient have difficulty walking or climbing stairs?: Yes Weakness of Legs: Both Weakness of Arms/Hands: Right  Permission Sought/Granted Permission sought to share information with : Facility Industrial/product designer granted to share information with : Yes, Verbal Permission Granted  Share Information with NAME: Delorise Shiner     Permission granted to share info w Relationship: Son  Permission granted to share info w Contact Information: [phone]  Emotional Assessment Appearance:: Other (Comment Required(Unable to Assess) Attitude/Demeanor/Rapport: Unable to Assess Affect (typically observed):  Unable to Assess Orientation: : Oriented to Self, Oriented to Place, Oriented to Situation Alcohol / Substance Use: Not  Applicable Psych Involvement: No (comment)  Admission diagnosis:  Acute CVA (cerebrovascular accident) [facility]) [I63.9] Cerebrovascular accident (CVA), unspecified mechanism ([place]) [I63.9] Patient Active Problem List   Diagnosis Date Noted  . Acute CVA (cerebrovascular accident) ([place]) 10/13/2019  . Vascular dementia without behavioral disturbance ([place]) 12/31/2018  . Protein-calorie malnutrition, severe 06/20/2018  . Encounter for general adult medical examination with abnormal findings 12/21/2017  . Hiatal hernia 12/19/2017  . CKD stage 2 due to type 2 diabetes mellitus ([place]) 08/02/2017  . Diet-controlled type 2 diabetes mellitus ([place]) 12/25/2016  . Atherosclerosis of aorta ([place]) 12/03/2015  . GERD 09/25/2014  . Diastolic CHF ([place]) 01/75/1025  . Hyperlipidemia associated with type 2 diabetes mellitus ([place])   . Vitamin D deficiency   . Anxiety state 03/15/2010  . Essential hypertension 03/15/2010  . Atrial fibrillation ([place]) 03/15/2010  . APHASIA DUE TO CEREBROVASCULAR DISEASE 03/15/2010  . History of CVA (cerebrovascular accident) 03/07/2010   PCP:  Unk Pinto, MD Pharmacy:   [facility], [place] [address] Phone: [phone] Fax: [phone]     Social Determinants of Health (SDOH) Interventions    Readmission Risk Interventions No flowsheet data found.

## 2019-10-15 NOTE — Progress Notes (Signed)
ANTICOAGULATION CONSULT NOTE - Initial Consult  Pharmacy Consult for warfarin Indication: atrial fibrillation  Allergies  Allergen Reactions  . Ace Inhibitors Cough  . Fosamax [Alendronate Sodium] Other (See Comments)    Heart burn  . Norvasc [Amlodipine Besylate] Other (See Comments)    edema  . Zocor [Simvastatin] Other (See Comments)    Elevated CPK    Patient Measurements: Height: 5\' 4"  (162.6 cm) Weight: 53.1 kg (117 lb) IBW/kg (Calculated) : 54.7   Vital Signs: Temp: 97.7 F (36.5 C) (05/19 0732) Temp Source: Oral (05/19 0732) BP: 135/64 (05/19 0732) Pulse Rate: 52 (05/19 0732)  Labs: Recent Labs    10/13/19 1109 10/13/19 1109 10/14/19 0548 10/14/19 0913 10/15/19 0432  HGB 15.0   < > 14.5  --  13.2  HCT 47.7*  --  44.8  --  41.5  PLT 264  --  273  --  257  APTT 43*  --   --   --   --   LABPROT 24.5*  --   --  22.6* 25.1*  INR 2.3*  --   --  2.1* 2.4*  CREATININE 0.98  --  0.87  --  0.85   < > = values in this interval not displayed.    Estimated Creatinine Clearance: 35.4 mL/min (by C-G formula based on SCr of 0.85 mg/dL).   Medical History: Past Medical History:  Diagnosis Date  . Anxiety   . Aphasia as late effect of cerebrovascular accident   . Atrial fibrillation (HCC)   . CKD (chronic kidney disease), stage II   . CVA (cerebral infarction)   . Diabetes mellitus type II   . DVT, lower extremity, distal, acute, right (HCC) 03/25/2018   Right peroneal vein 03/22/2018  . GERD (gastroesophageal reflux disease)   . Hiatal hernia   . HTN (hypertension)   . Hyperlipidemia   . IBS (irritable bowel syndrome)   . Stroke (HCC)   . Vitamin D deficiency      Assessment: 86 yo female with h/o CVA and afib brought to ER for right sided weakness. Found to have left pontine infarct and right semiovate infarct. Pharmacy consulted to resume warfarin. Home dose is 2mg  daily except 1mg  on Mondays and Fridays. INR on admit was 2.3 on 5/17 with last dose of  warfarin given on 5/17.   INR today 2.4. Will continue home dose of 2mg  this afternoon.  Goal of Therapy:  INR 2-3 Monitor platelets by anticoagulation protocol: Yes   Plan:  Warfarin 2mg  PO x 1 at 1600 Daily INR Monitor bleeding  Tabitha Riggins A. 10-16-1987, PharmD, BCPS, FNKF Clinical Pharmacist [place] Please utilize Amion for appropriate phone number to reach the unit pharmacist [facility])   10/15/2019,7:54 AM

## 2019-10-15 NOTE — Progress Notes (Signed)
PROGRESS NOTE  Lisa Hopkins  DOB: [birth year]  PCP: Lucky Cowboy, MD YOV:[id number]  DOA: 10/13/2019  LOS: 2 days   Chief Complaint  Patient presents with  . Weakness   Brief narrative: Lisa Hopkins is a 86 y.o. female with PMH of of CVA, permanent A. fib on Coumadin, hypertension. Patient was brought to the ED by her son on 5/17 for persistent right-sided weakness. Per patient's son, symptoms started on Oct 07, 2019 when patient had weakness of the right lower extremity.  She was taken to her primary care physician and was prescribed prednisone for possible pinched nerve. Following that, patient started developing weakness of the right upper extremity which progressively worsened.  She was brought to the ED for further evaluation.  In the ED, MRI of the brain shows acute left pontine infarct and infarct in the right semiovale.  Seen by neurology/stroke team Dr. Otelia Limes.   Patient was admitted to hospitalist service for further evaluation management.   Labs revealed therapeutic INR of 2.3. EKG showed A. fib flutter. Rate controlled.  WBC was 13.1 K, Urinalysis on admission showed hazy yellow urine with large leukocytes and positive nitrite.    Subjective: Patient was seen and examined this morning.  Pleasant elderly Caucasian female.  Lying on bed.  Not in distress.  No new symptoms.  Her son was at bedside.  Chart reviewed. No fever, heart rate in 60s, as low as 52 this morning, blood pressure 130s, breathing comfortably on room air. Labs from this morning were normal.  Assessment/Plan: Acute bilateral stroke  -Presented with 6-day history of right-sided weakness. -MRI brain showed lacunar infarct in the left pons and subacute white matter lacunar infarct in the right centrum -additionally MRI showed advanced chronic ischemic disease, diffuse atrophy, chronic left frontal lobe medium-sized ischemic infarction and chronic right cerebellar medium-sized /ischemic  infarctions. -Followed by neuro stroke team -CTA head and neck negative for large vessel occlusion, but showing severe stenosis of the left PCA distal P2 and PCA bifurcation. -Currently on warfarin, continue per neurology.  Target INR 2-3. -LDL 124, goal less than 70 -Hemoglobin A1c 6.2, goal less than 7.0, at goal -2D echo LVEF 65 to 70%, no PFO, no source of emboli  -Patient has an insensitivity to statin, causing elevated CPK, therefore was started on low-dose Lipitor per neuro, neurology discussed with son who is okay with it. -Continue aspirin 81 mg daily, Coumadin, and Lipitor 20 mg daily as recommended by neurology  Dysphagia Speech therapist post MBS recommends dysphagia 2 diet, fine chopped solids with nectar thick liquid  Hyperlipidemia LDL 124, goal less than 70 Continue Lipitor 20 mg daily Started on low-dose statin due to insensitivity to statin, causing elevated CPK Neurology discussed with her son, okay to start it  Permanent A. fib Rate controlled on Lopressor 12.5 mg twice daily.  Ambulatory dysfunction in the setting of acute CVA PT OT with recommendation for SNF [facility] assisting with SNF placement Continue PT OT with assistance and fall precautions.  E. coli UTI Presented with leukocytosis 13 K which has resolved. UA done on admission positive for pyuria Urine culture grew E. coli, pending sensitivity. Currently on Rocephin empirically  Essential hypertension BP stable Resume Lopressor p.o. 12.5 mg twice daily Continue to monitor vital signs P.o. Lasix, losartan held to avoid hypotension  Moderate protein calorie malnutrition Albumin 3.4 BMI 20 Some muscle mass loss Encourage oral intake as tolerated Continue aspiration precautions  CKD 3A Appears to be at her  baseline creatinine 0.8 with GFR 58 Continue to avoid nephrotoxins Continue to hold off p.o. Lasix  Mobility: PT eval Code Status:  DNR per chart  DVT prophylaxis:   Coumadin Antimicrobials:  IV Rocephin Fluid: Normal saline at 50 mils per hour Diet: Dysphagia 2 diet with nectar thick liquid  Consultants: Neurology Family Communication:  Son at bedside.  Status is: Inpatient  Remains inpatient appropriate because:IV treatments appropriate due to intensity of illness or inability to take PO   Dispo: The patient is from: Home              Anticipated d/c is to: SNF              Anticipated d/c date is: 2 days              Patient currently is not medically stable to d/c.            Antimicrobials: Anti-infectives (From admission, onward)   Start     Dose/Rate Route Frequency Ordered Stop   10/14/19 0600  cefTRIAXone (ROCEPHIN) 1 g in sodium chloride 0.9 % 100 mL IVPB     1 g 200 mL/hr over 30 Minutes Intravenous Every 24 hours 10/14/19 0542          Code Status: DNR   Diet Order            DIET DYS 2 Room service appropriate? Yes; Fluid consistency: Nectar Thick  Diet effective now              Infusions:  . sodium chloride 50 mL/hr at 10/14/19 2036  . cefTRIAXone (ROCEPHIN)  IV 1 g (10/15/19 0601)    Scheduled Meds: .  stroke: mapping our early stages of recovery book   Does not apply Once  . aspirin EC  81 mg Oral Daily  . atorvastatin  20 mg Oral Daily  . insulin aspart  0-6 Units Subcutaneous Q4H  . mouth rinse  15 mL Mouth Rinse BID  . metoprolol tartrate  12.5 mg Oral BID  . warfarin  2 mg Oral ONCE-1600  . Warfarin - Pharmacist Dosing Inpatient   Does not apply q1600    PRN meds: acetaminophen **OR** acetaminophen (TYLENOL) oral liquid 160 mg/5 mL **OR** acetaminophen, hydrALAZINE, Resource ThickenUp Clear   Objective: Vitals:   10/15/19 0432 10/15/19 0732  BP: 137/78 135/64  Pulse: (!) 53 (!) 52  Resp: 19 18  Temp: 97.8 F (36.6 C) 97.7 F (36.5 C)  SpO2: 98% 98%    Intake/Output Summary (Last 24 hours) at 10/15/2019 0835 Last data filed at 10/15/2019 0601 Gross per 24 hour  Intake 1109.25 ml   Output 500 ml  Net 609.25 ml   Filed Weights   10/14/19 0631  Weight: 53.1 kg   Weight change:  Body mass index is 20.08 kg/m.   Physical Exam: General exam: Appears calm and comfortable.  Skin: No rashes, lesions or ulcers. HEENT: Atraumatic, normocephalic, supple neck, no obvious bleeding Lungs: Clear to auscultation bilaterally CVS: Regular rate and rhythm, no murmur GI/Abd soft, nontender, nondistended, bowel sound present CNS: Alert, awake, hard of hearing, oriented to place and person Psychiatry: Mood appropriate Extremities: No pedal edema, no calf tenderness  Data Review: I have personally reviewed the laboratory data and studies available.  Recent Labs  Lab 10/09/19 1632 10/13/19 1109 10/14/19 0548 10/15/19 0432  WBC 7.5 13.1* 9.5 8.2  NEUTROABS 3,765 7.2 5.1 4.2  HGB 14.9 15.0 14.5 13.2  HCT 45.3* 47.7*  44.8 41.5  MCV 91.1 96.4 93.5 94.7  PLT 251 264 273 257   Recent Labs  Lab 10/09/19 1632 10/13/19 1109 10/14/19 0548 10/15/19 0432  NA 141 137 139 139  K 5.4* 3.2* 3.8 3.6  CL 101 103 103 106  CO2 31 22 28 24   GLUCOSE 129* 131* 122* 103*  BUN 15 16 15 20   CREATININE 0.94* 0.98 0.87 0.85  CALCIUM 10.8* 9.3 9.3 8.8*  MG  --   --   --  1.9  PHOS  --   --   --  3.8    Signed, , MD [facility] Pager: [phone] (Secure Chat preferred). 10/15/2019

## 2019-10-15 NOTE — Progress Notes (Signed)
Speech Language Pathology Treatment: Dysphagia  Patient Details Name: Lisa Hopkins MRN: [id number] DOB: [birth year] Today's Date: 10/15/2019 Time: 3875-6433 SLP Time Calculation (min) (ACUTE ONLY): 14 min  Assessment / Plan / Recommendation Clinical Impression  Pt was seen for treatment and was cooperative throughout the session. Nursing denied any overt s/sx of aspiration with p.o. intake. She refused any solid trials but accepted nectar thick liquids. She was unable to recall swallowing precautions despite verbal prompts. However, she consistently utilized a chin tuck posture following the initial prompt to do so. No s/sx of aspiration were noted. Speech was intelligible during the session with some articulatory imprecision. Pt provided inconsistent reports regarding her proximity to baseline in the area of motor speech. SLP will continue to follow pt.    HPI HPI: Pt is a 86 yo female presenting after six days of R sided weakness. MRI revealed an acute lacunar infarct in the L pons. Pt had previous MBS in 2011, recommending nectar thick liquids given aspiration of thin liquids. More recent BSE in 2019 reported normal appearing oropharyngeal swallow but with more symptomatic esophageal dysphagia. PMH includes: large HH, GERD, esophageal stricture, CVA, aphasia, IBS, HLD, HTN, DMII, CKD, Afib, anxiety      SLP Plan  Continue with current plan of care       Recommendations  Diet recommendations: Dysphagia 2 (fine chop);Nectar-thick liquid Liquids provided via: Cup;Straw Medication Administration: Whole meds with puree Supervision: Staff to assist with self feeding;Full supervision/cueing for compensatory strategies Compensations: Small sips/bites;Use straw to facilitate chin tuck;Chin tuck;Slow rate;Clear throat intermittently Postural Changes and/or Swallow Maneuvers: Seated upright 90 degrees;Upright 30-60 min after meal                Oral Care Recommendations: Oral care  BID Follow up Recommendations: (tba) SLP Visit Diagnosis: Dysphagia, oropharyngeal phase (R13.12) Plan: Continue with current plan of care       Lisa Hopkins I. Vear Clock, MS, CCC-SLP Acute Rehabilitation Services Office number [phone] Pager [phone]                Lisa Hopkins 10/15/2019, 5:56 PM

## 2019-10-15 NOTE — Progress Notes (Signed)
Physical Therapy Treatment Patient Details Name: Lisa Hopkins MRN: [id number] DOB: 1924/07/09 Today's Date: 10/15/2019    History of Present Illness 86 y.o. female with history of CVA (residual aphasia) A. fib hypertension diabetes mellitus DVT RLE was brought to the ER 10/13/19 after patient was having persistent right-sided weakness since Oct 07, 2019. She saw PCP, prescribed prednisone for possible pinched nerve. Started developing weakness of the right upper extremity. MRI brain acute left pontine infarct and also infarct in the right semiovale.    PT Comments    Patient more alert today. Noted RLE with adductor tone and poor attention to RUE's use on RW with gait. She has very narrow step width (almost scissoring) but can correct to wider step~25% of the time. Able to stand at sink to brush teeth (using LUE to brush) with mod assist due to rt weakness and inattention (RUE and RLE begin to slowly give way but pt can correct with cues.    Follow Up Recommendations  SNF     Equipment Recommendations  None recommended by PT    Recommendations for Other Services       Precautions / Restrictions Precautions Precautions: Fall Precaution Comments: expressive aphasia at baseline  Restrictions Weight Bearing Restrictions: No    Mobility  Bed Mobility Overal bed mobility: Needs Assistance Bed Mobility: Supine to Sit     Supine to sit: Min assist;Mod assist;HOB elevated     General bed mobility comments: pt able to roll toward her left and slowly get RLE over EOB, however requires assist to push up to sitting and to scoot out to EOB (esp rt hip)  Transfers Overall transfer level: Needs assistance Equipment used: Rolling walker (2 wheeled);2 person hand held assist Transfers: Sit to/from Stand Sit to Stand: Min assist;+2 safety/equipment         General transfer comment: pt required instructional cues for hand placement; assist for anterior wt-shift and slightly for  full upright position  Ambulation/Gait Ambulation/Gait assistance: Mod assist;+2 safety/equipment Gait Distance (Feet): 5 Feet(seated rest, 10 ft) Assistive device: Rolling walker (2 wheeled);2 person hand held assist Gait Pattern/deviations: Step-to pattern;Decreased stride length;Decreased dorsiflexion - right;Trunk flexed;Step-through pattern;Steppage;Ataxic;Narrow base of support   Gait velocity interpretation: <1.31 ft/sec, indicative of household ambulator General Gait Details: RLE tends to adduct, forcing narrow BOS and at times LOB to her rt; only able to follow instruction to widen steps by abducting RLE as advancing ~25% of steps; chair follow during 10 ft   Stairs             Wheelchair Mobility    Modified Rankin (Stroke Patients Only) Modified Rankin (Stroke Patients Only) Pre-Morbid Rankin Score: Moderate disability Modified Rankin: Moderately severe disability     Balance Overall balance assessment: Needs assistance Sitting-balance support: No upper extremity supported;Feet supported Sitting balance-Leahy Scale: Fair     Standing balance support: Bilateral upper extremity supported;During functional activity;No upper extremity supported Standing balance-Leahy Scale: Poor Standing balance comment: stood at sink ~3 minutes with pt using RUE to stabilize (noted inattention as she began brushing her teeth with Lt hand and RUE support diminished with rt lean. Required verbal cues to re-engage (not responding to pure tactile cues)                            Cognition Arousal/Alertness: Awake/alert Behavior During Therapy: WFL for tasks assessed/performed Overall Cognitive Status: Difficult to assess Area of Impairment: Following commands;Safety/judgement;Awareness;Problem solving;Memory  Memory: Decreased short-term memory Following Commands: Follows one step commands consistently;Follows one step commands with increased  time;Follows multi-step commands inconsistently Safety/Judgement: Decreased awareness of safety;Decreased awareness of deficits Awareness: Intellectual Problem Solving: Slow processing;Requires verbal cues;Difficulty sequencing;Requires tactile cues General Comments: needed repeated reminding for hand placement relative to RW during sit to stand and stand to sit;        Exercises General Exercises - Lower Extremity Ankle Circles/Pumps: AROM;AAROM;Right;10 reps;Seated Long Arc Quad: AROM;Right;10 reps;Seated    General Comments General comments (skin integrity, edema, etc.): son present, however steps out of the room. Updated on her status on his return      Pertinent Vitals/Pain Pain Assessment: Faces Faces Pain Scale: No hurt    Home Living                      Prior Function            PT Goals (current goals can now be found in the care plan section) Acute Rehab PT Goals Patient Stated Goal: to get up and brush teeth PT Goal Formulation: Patient unable to participate in goal setting Time For Goal Achievement: 10/28/19 Progress towards PT goals: Progressing toward goals    Frequency    Min 3X/week      PT Plan Current plan remains appropriate    Co-evaluation              AM-PAC PT "6 Clicks" Mobility   Outcome Measure  Help needed turning from your back to your side while in a flat bed without using bedrails?: A Little Help needed moving from lying on your back to sitting on the side of a flat bed without using bedrails?: A Little Help needed moving to and from a bed to a chair (including a wheelchair)?: A Lot Help needed standing up from a chair using your arms (e.g., wheelchair or bedside chair)?: A Lot Help needed to walk in hospital room?: A Lot Help needed climbing 3-5 steps with a railing? : Total 6 Click Score: 13    End of Session Equipment Utilized During Treatment: Gait belt Activity Tolerance: Patient tolerated treatment  well Patient left: with call bell/phone within reach;with chair alarm set;with family/visitor present;in chair   PT Visit Diagnosis: Hemiplegia and hemiparesis;Other abnormalities of gait and mobility (R26.89) Hemiplegia - Right/Left: Right Hemiplegia - dominant/non-dominant: Dominant     Time: 6283-1517 PT Time Calculation (min) (ACUTE ONLY): 27 min  Charges:  $Gait Training: 8-22 mins $Therapeutic Activity: 8-22 mins                      Arby Barrette, PT Pager [phone]    Rexanne Mano 10/15/2019, 1:02 PM

## 2019-10-16 LAB — URINE CULTURE: Culture: >=100000 — AB

## 2019-10-16 LAB — GLUCOSE, CAPILLARY
Glucose-Capillary: 90 mg/dL (ref 70–99)
Glucose-Capillary: 101 mg/dL — ABNORMAL HIGH (ref 70–99)
Glucose-Capillary: 102 mg/dL — ABNORMAL HIGH (ref 70–99)
Glucose-Capillary: 135 mg/dL — ABNORMAL HIGH (ref 70–99)
Glucose-Capillary: 131 mg/dL — ABNORMAL HIGH (ref 70–99)

## 2019-10-16 LAB — PROTIME-INR
INR: 3.1 — ABNORMAL HIGH (ref 0.8–1.2)
Prothrombin Time: 30.6 seconds — ABNORMAL HIGH (ref 11.4–15.2)

## 2019-10-16 MED ORDER — FUROSEMIDE 20 MG PO TABS
20.0000 mg | ORAL_TABLET | Freq: Every day | ORAL | Status: DC
  Administered 2019-10-17: 20 mg via ORAL
  Filled 2019-10-16: qty 1

## 2019-10-16 MED ORDER — LOSARTAN POTASSIUM 50 MG PO TABS
50.0000 mg | ORAL_TABLET | Freq: Every day | ORAL | Status: DC
  Administered 2019-10-17: 50 mg via ORAL
  Filled 2019-10-16: qty 1

## 2019-10-16 NOTE — Progress Notes (Signed)
Occupational Therapy Treatment Patient Details Name: Lisa Hopkins MRN: [id number] DOB: [birth year] Today's Date: 10/16/2019    History of present illness 86 y.o. female with history of CVA (residual aphasia) A. fib hypertension diabetes mellitus DVT RLE was brought to the ER 10/13/19 after patient was having persistent right-sided weakness since Oct 07, 2019. She saw PCP, prescribed prednisone for possible pinched nerve. Started developing weakness of the right upper extremity. MRI brain acute left pontine infarct and also infarct in the right semiovale.   OT comments  Pt making slow progress with adls and functional mobility during adls. Pt very weak on the right as well as poor sensation/proprioception on the r side affecting safety when pt on feet.  Pt limited by poor balance, strength and coordination during all adls. Pt son was present.  Spoke with son after about the possibility that pt may not be able to live totally alone again.  Sons have discussed moving pt to assisted living and are looking into this now that this event has taken place.  Feel pt will improve with SNF rehab.  Will continue acutely with focus on adls in standing and increased use of RUE during adls.   Follow Up Recommendations  SNF;Supervision/Assistance - 24 hour    Equipment Recommendations       Recommendations for Other Services      Precautions / Restrictions Precautions Precautions: Fall Precaution Comments: expressive aphasia at baseline  Restrictions Weight Bearing Restrictions: No       Mobility Bed Mobility Overal bed mobility: Needs Assistance Bed Mobility: Supine to Sit     Supine to sit: HOB elevated;Mod assist     General bed mobility comments: Given time and specific instructions, pt could move to side of bed with very little physical assist.  Transfers Overall transfer level: Needs assistance Equipment used: Rolling walker (2 wheeled);2 person hand held assist Transfers: Sit  to/from Stand Sit to Stand: Min assist Stand pivot transfers: Min assist       General transfer comment: Pt requierd cues for hand placement each time she transferred.    Balance Overall balance assessment: Needs assistance Sitting-balance support: No upper extremity supported;Feet supported Sitting balance-Leahy Scale: Fair     Standing balance support: Bilateral upper extremity supported;During functional activity Standing balance-Leahy Scale: Poor Standing balance comment: Pt requires significant assist due to R side weakness and mild inattn.                           ADL either performed or assessed with clinical judgement   ADL Overall ADL's : Needs assistance/impaired     Grooming: Wash/dry hands;Oral care;Minimal assistance;Standing;Cueing for compensatory techniques Grooming Details (indicate cue type and reason): Pt stood for appx 5 minutes to brush teeth. Pt kyphotic and fatigued quickly but able to finish brushing teeth using LUE. Pt did use RUE as gross assist without cues.             Lower Body Dressing: +2 for physical assistance;Maximal assistance;Sit to/from stand;Cueing for compensatory techniques Lower Body Dressing Details (indicate cue type and reason): Pt attempts to use RUE most of the time as she is right handed but has more success with LUE.  Pt not neglecting RUE during adls and is aware that it is not working well. Toilet Transfer: Ambulation;Moderate assistance;BSC;Regular Toilet;RW Statistician Details (indicate cue type and reason): Pt walked to bathroom with walker and mod assist.  Pt dragging RLE at times and  cannot always clear it when walking. Pt needs hand over hand to keep R hand on walker due to neglect/decreased feeling in RUE.         Functional mobility during ADLs: Moderate assistance;Rolling walker;Cueing for sequencing;Cueing for safety General ADL Comments: pt limited by R sided weakness, impaired balance, decrased  activity tolerance and impaired cognition     Vision       Perception     Praxis      Cognition Arousal/Alertness: Awake/alert Behavior During Therapy: WFL for tasks assessed/performed Overall Cognitive Status: Impaired/Different from baseline Area of Impairment: Safety/judgement;Awareness;Problem solving;Memory                     Memory: Decreased short-term memory Following Commands: Follows one step commands consistently;Follows one step commands with increased time;Follows multi-step commands inconsistently Safety/Judgement: Decreased awareness of safety;Decreased awareness of deficits Awareness: Emergent Problem Solving: Slow processing;Requires verbal cues;Difficulty sequencing;Requires tactile cues General Comments: needed repeated reminding for hand placement relative to RW during sit to stand and stand to sit;          Exercises     Shoulder Instructions       General Comments Pt most limited by poor balance, ataxia, weak R side and impaired cognition.    Pertinent Vitals/ Pain       Pain Assessment: Faces Faces Pain Scale: Hurts little more Pain Location: L hip and R leg. Better after getting OOB. Pain Descriptors / Indicators: Grimacing Pain Intervention(s): Monitored during session;Repositioned  Home Living                                          Prior Functioning/Environment              Frequency  Min 2X/week        Progress Toward Goals  OT Goals(current goals can now be found in the care plan section)  Progress towards OT goals: Progressing toward goals  Acute Rehab OT Goals Patient Stated Goal: to get up and brush teeth OT Goal Formulation: With patient Time For Goal Achievement: 10/28/19 Potential to Achieve Goals: Good ADL Goals Pt Will Perform Grooming: with supervision;standing;sitting Pt Will Perform Upper Body Bathing: with supervision;sitting Pt Will Perform Lower Body Dressing: with min  assist;sit to/from stand Pt Will Transfer to Toilet: with min assist;ambulating Pt Will Perform Toileting - Clothing Manipulation and hygiene: with min assist;sit to/from stand Additional ADL Goal #1: Pt will follow multiple step commands with 90% accuracy to optimize participation and independence with ADLs.  Plan Discharge plan remains appropriate    Co-evaluation                 AM-PAC OT "6 Clicks" Daily Activity     Outcome Measure   Help from another person eating meals?: A Little Help from another person taking care of personal grooming?: A Lot Help from another person toileting, which includes using toliet, bedpan, or urinal?: A Lot Help from another person bathing (including washing, rinsing, drying)?: A Lot Help from another person to put on and taking off regular upper body clothing?: A Lot Help from another person to put on and taking off regular lower body clothing?: Total 6 Click Score: 12    End of Session Equipment Utilized During Treatment: Gait belt;Rolling walker  OT Visit Diagnosis: Other abnormalities of gait and mobility (R26.89);Muscle weakness (generalized) (M62.81);Other symptoms  and signs involving the nervous system (R29.898);Cognitive communication deficit (R41.841) Symptoms and signs involving cognitive functions: Cerebral infarction   Activity Tolerance Patient tolerated treatment well   Patient Left in chair;with call bell/phone within reach;with chair alarm set;with family/visitor present   Nurse Communication Mobility status;Precautions;Other (comment)        Time: 6184-8592 OT Time Calculation (min): 37 min  Charges: OT General Charges $OT Visit: 1 Visit OT Treatments $Self Care/Home Management : 23-37 mins   Glenford Peers 10/16/2019, 11:59 AM

## 2019-10-16 NOTE — Plan of Care (Signed)
  Problem: Coping: Goal: Will verbalize positive feelings about self Outcome: Progressing Goal: Will identify appropriate support needs Outcome: Progressing   Problem: Self-Care: Goal: Ability to participate in self-care as condition permits will improve Outcome: Progressing   

## 2019-10-16 NOTE — TOC Progression Note (Signed)
Transition of Care [facility]) - Progression Note    Patient Details  Name: Lisa Hopkins MRN: [id number] Date of Birth: 29-Aug-1924  Transition of Care [facility]) CM/SW Contact  Mearl Latin, LCSW Phone Number: 10/16/2019, 1:22 PM  Clinical Narrative:    12pm-CSW presented SNF offers to patient and her son, [name], at bedside. [name] selected [facility]. They have a bed for patient pending insurance authorization. CSW submitted insurance documents.   1pm-Patient's son called CSW back and requested [facility] instead. [facility] will have a bed for patient tomorrow pending insurance approval. Son will go there today to complete admission paperwork. CSW contacted insurance and changed facility name to [facility]. No updated COVID test needed since patient is not symptomatic.    Expected Discharge Plan: Skilled Nursing Facility Barriers to Discharge: Continued Medical Work up  Expected Discharge Plan and Services Expected Discharge Plan: Skilled Nursing Facility       Living arrangements for the past 2 months: Independent Living Facility                                       Social Determinants of Health (SDOH) Interventions    Readmission Risk Interventions No flowsheet data found.

## 2019-10-16 NOTE — Progress Notes (Signed)
ANTICOAGULATION CONSULT NOTE - Initial Consult  Pharmacy Consult for warfarin Indication: atrial fibrillation  Allergies  Allergen Reactions  . Ace Inhibitors Cough  . Fosamax [Alendronate Sodium] Other (See Comments)    Heart burn  . Norvasc [Amlodipine Besylate] Other (See Comments)    edema  . Zocor [Simvastatin] Other (See Comments)    Elevated CPK    Patient Measurements: Height: 5\' 4"  (162.6 cm) Weight: 53.1 kg (117 lb) IBW/kg (Calculated) : 54.7   Vital Signs: Temp: 98 F (36.7 C) (05/20 0722) Temp Source: Oral (05/20 0722) BP: 163/64 (05/20 0722) Pulse Rate: 49 (05/20 0722)  Labs: Recent Labs    10/13/19 1109 10/13/19 1109 10/14/19 0548 10/14/19 0913 10/15/19 0432 10/16/19 0211  HGB 15.0   < > 14.5  --  13.2  --   HCT 47.7*  --  44.8  --  41.5  --   PLT 264  --  273  --  257  --   APTT 43*  --   --   --   --   --   LABPROT 24.5*   < >  --  22.6* 25.1* 30.6*  INR 2.3*   < >  --  2.1* 2.4* 3.1*  CREATININE 0.98  --  0.87  --  0.85  --    < > = values in this interval not displayed.    Estimated Creatinine Clearance: 35.4 mL/min (by C-G formula based on SCr of 0.85 mg/dL).   Medical History: Past Medical History:  Diagnosis Date  . Anxiety   . Aphasia as late effect of cerebrovascular accident   . Atrial fibrillation (HCC)   . CKD (chronic kidney disease), stage II   . CVA (cerebral infarction)   . Diabetes mellitus type II   . DVT, lower extremity, distal, acute, right (HCC) 03/25/2018   Right peroneal vein 03/22/2018  . GERD (gastroesophageal reflux disease)   . Hiatal hernia   . HTN (hypertension)   . Hyperlipidemia   . IBS (irritable bowel syndrome)   . Stroke (HCC)   . Vitamin D deficiency      Assessment: 86 yo female with h/o CVA and afib brought to ER for right sided weakness. Found to have left pontine infarct and right semiovate infarct. Pharmacy consulted to resume warfarin. Home dose is 2mg  daily except 1mg  on Mondays and  Fridays. INR on admit was 2.3 on 5/17 with last dose of warfarin given on 5/17.   Large jump in INR today from 2.4>>3.1 on home dose. Will hold dose today as supratherapeutic. No bleeding noted.   Goal of Therapy:  INR 2-3 Monitor platelets by anticoagulation protocol: Yes   Plan:  Hold warfarin tonight Daily INR Monitor bleeding  Mearl Harewood A. 10-16-1987, PharmD, BCPS, FNKF Clinical Pharmacist [place] Please utilize Amion for appropriate phone number to reach the unit pharmacist [facility])   10/16/2019,7:40 AM

## 2019-10-16 NOTE — Progress Notes (Signed)
PROGRESS NOTE  Lisa Hopkins  DOB: [birth year]  PCP: Unk Pinto, MD FOY:[id number]  DOA: 10/13/2019  LOS: 3 days   Chief Complaint  Patient presents with  . Weakness   Brief narrative: Lisa Hopkins is a 86 y.o. female with PMH of of CVA, permanent A. fib on Coumadin, hypertension. Patient was brought to the ED by her son on 5/17 for persistent right-sided weakness. Per patient's son, symptoms started on Oct 07, 2019 when patient had weakness of the right lower extremity.  She was taken to her primary care physician and was prescribed prednisone for possible pinched nerve. Following that, patient started developing weakness of the right upper extremity which progressively worsened.  She was brought to the ED for further evaluation.  In the ED, MRI of the brain shows acute left pontine infarct and infarct in the right semiovale.  Seen by neurology/stroke team Dr. Cheral Marker.   Patient was admitted to hospitalist service for further evaluation management.   Labs revealed therapeutic INR of 2.3. EKG showed A. fib flutter. Rate controlled.  WBC was 13.1 K, Urinalysis on admission showed hazy yellow urine with large leukocytes and positive nitrite.    Subjective: Patient was seen and examined this morning.  Pleasant elderly Caucasian female.  Propped up in bed.  Not in distress.  Tired of being bedbound.  Prior to stroke, she was active at home.  Son at bedside.    Chart reviewed. No fever, heart rate with frequent drops to 50s.  Blood pressure in 160s  Assessment/Plan: Acute bilateral stroke  -Presented with 6-day history of right-sided weakness. -MRI brain showed lacunar infarct in the left pons and subacute white matter lacunar infarct in the right centrum -additionally MRI showed advanced chronic ischemic disease, diffuse atrophy, chronic left frontal lobe medium-sized ischemic infarction and chronic right cerebellar medium-sized /ischemic infarctions. -Followed by  neuro stroke team -CTA head and neck negative for large vessel occlusion, but showing severe stenosis of the left PCA distal P2 and PCA bifurcation. -Currently on warfarin, continue per neurology.  Target INR 2-3. -LDL 124, goal less than 70 -Hemoglobin A1c 6.2, goal less than 7.0, at goal -2D echo LVEF 65 to 70%, no PFO, no source of emboli  -Patient has an insensitivity to statin, causing elevated CPK, therefore was started on low-dose Lipitor per neuro, neurology discussed with son who is okay with it. -Continue aspirin 81 mg daily, Coumadin, and Lipitor 20 mg daily as recommended by neurology  Dysphagia Speech therapist post MBS recommends dysphagia 2 diet, fine chopped solids with nectar thick liquid  Ambulatory dysfunction in the setting of acute CVA PT OT with recommendation for SNF TOC assisting with SNF placement Continue PT OT with assistance and fall precautions.  Hyperlipidemia LDL 124, goal less than 70 Continue Lipitor 20 mg daily Started on low-dose statin due to insensitivity to statin, causing elevated CPK Neurology discussed with her son, okay to start it  Permanent A. fib -At admission, patient was on metoprolol 25 mg twice daily.  Currently on half dose at 12.5 mg twice daily, heart rate is controlled.  Since that has episodes of bradycardia to 50s.    Essential hypertension Prior to admission, patient was on metoprolol 25 mg daily, Lasix 20 mg daily and losartan 50 mg daily.  Currently only on metoprolol 12.5 mg twice daily.   -Blood pressure trending up in 160s today.  Permissive hypertension allowed so far.  I will resume losartan and Lasix from tomorrow.   -  Continue to monitor vital signs  E. coli UTI Presented with leukocytosis 13 K which has resolved. UA done on admission positive for pyuria Urine culture grew E. coli, pending sensitivity. Currently on Rocephin empirically  Moderate protein calorie malnutrition Albumin 3.4 BMI 20 Some muscle  mass loss Encourage oral intake as tolerated Continue aspiration precautions  CKD 3A Appears to be at her baseline creatinine 0.8 with GFR 58 Continue to avoid nephrotoxins  Mobility: PT eval Code Status:  DNR per chart  DVT prophylaxis:  Coumadin Antimicrobials:  IV Rocephin Fluid: Okay to stop IV fluid today. Diet: Dysphagia 2 diet with nectar thick liquid  Consultants: Neurology Family Communication:  Son at bedside.  Status is: Inpatient  Remains inpatient appropriate because:IV treatments appropriate due to intensity of illness or inability to take PO   Dispo: The patient is from: Home              Anticipated d/c is to: SNF              Anticipated d/c date is: 1 day              Patient currently is not medically stable to d/c.    Antimicrobials: Anti-infectives (From admission, onward)   Start     Dose/Rate Route Frequency Ordered Stop   10/14/19 0600  cefTRIAXone (ROCEPHIN) 1 g in sodium chloride 0.9 % 100 mL IVPB     1 g 200 mL/hr over 30 Minutes Intravenous Every 24 hours 10/14/19 0542          Code Status: DNR   Diet Order            DIET DYS 2 Room service appropriate? Yes; Fluid consistency: Nectar Thick  Diet effective now              Infusions:  . cefTRIAXone (ROCEPHIN)  IV Stopped (10/16/19 0603)    Scheduled Meds: .  stroke: mapping our early stages of recovery book   Does not apply Once  . aspirin EC  81 mg Oral Daily  . atorvastatin  20 mg Oral Daily  . [START ON 10/17/2019] furosemide  20 mg Oral Daily  . insulin aspart  0-6 Units Subcutaneous Q4H  . [START ON 10/17/2019] losartan  50 mg Oral Daily  . mouth rinse  15 mL Mouth Rinse BID  . melatonin  3 mg Oral QHS  . metoprolol tartrate  12.5 mg Oral BID  . QUEtiapine  12.5 mg Oral QHS  . Warfarin - Pharmacist Dosing Inpatient   Does not apply q1600    PRN meds: acetaminophen **OR** acetaminophen (TYLENOL) oral liquid 160 mg/5 mL **OR** acetaminophen, hydrALAZINE, Resource  ThickenUp Clear   Objective: Vitals:   10/16/19 0913 10/16/19 1201  BP:  (!) 165/98  Pulse: 64 (!) 47  Resp:  18  Temp:  97.7 F (36.5 C)  SpO2:  99%    Intake/Output Summary (Last 24 hours) at 10/16/2019 1353 Last data filed at 10/16/2019 1241 Gross per 24 hour  Intake 0 ml  Output 600 ml  Net -600 ml   Filed Weights   10/14/19 0631  Weight: 53.1 kg   Weight change:  Body mass index is 20.08 kg/m.   Physical Exam: General exam: Appears calm and comfortable.  Not in physical distress Skin: No rashes, lesions or ulcers. HEENT: Atraumatic, normocephalic, supple neck, no obvious bleeding Lungs: Clear to auscultation bilaterally CVS: Regular rate and rhythm, no murmur GI/Abd soft, nontender, nondistended, bowel sound  present CNS: Alert, awake, hard of hearing, oriented to place and person Psychiatry: Mood appropriate Extremities: No pedal edema, no calf tenderness  Data Review: I have personally reviewed the laboratory data and studies available.  Recent Labs  Lab 10/09/19 1632 10/13/19 1109 10/14/19 0548 10/15/19 0432  WBC 7.5 13.1* 9.5 8.2  NEUTROABS 3,765 7.2 5.1 4.2  HGB 14.9 15.0 14.5 13.2  HCT 45.3* 47.7* 44.8 41.5  MCV 91.1 96.4 93.5 94.7  PLT 251 264 273 257   Recent Labs  Lab 10/09/19 1632 10/13/19 1109 10/14/19 0548 10/15/19 0432  NA 141 137 139 139  K 5.4* 3.2* 3.8 3.6  CL 101 103 103 106  CO2 31 22 28 24   GLUCOSE 129* 131* 122* 103*  BUN 15 16 15 20   CREATININE 0.94* 0.98 0.87 0.85  CALCIUM 10.8* 9.3 9.3 8.8*  MG  --   --   --  1.9  PHOS  --   --   --  3.8    Signed, , MD [facility] Pager: [phone] (Secure Chat preferred). 10/16/2019

## 2019-10-17 DIAGNOSIS — M2550 Pain in unspecified joint: Secondary | ICD-10-CM | POA: Diagnosis not present

## 2019-10-17 DIAGNOSIS — E782 Mixed hyperlipidemia: Secondary | ICD-10-CM | POA: Diagnosis not present

## 2019-10-17 DIAGNOSIS — E039 Hypothyroidism, unspecified: Secondary | ICD-10-CM | POA: Diagnosis not present

## 2019-10-17 DIAGNOSIS — E43 Unspecified severe protein-calorie malnutrition: Secondary | ICD-10-CM | POA: Diagnosis not present

## 2019-10-17 DIAGNOSIS — Z7401 Bed confinement status: Secondary | ICD-10-CM | POA: Diagnosis not present

## 2019-10-17 DIAGNOSIS — R2689 Other abnormalities of gait and mobility: Secondary | ICD-10-CM | POA: Diagnosis not present

## 2019-10-17 DIAGNOSIS — M6281 Muscle weakness (generalized): Secondary | ICD-10-CM | POA: Diagnosis not present

## 2019-10-17 DIAGNOSIS — I4891 Unspecified atrial fibrillation: Secondary | ICD-10-CM | POA: Diagnosis not present

## 2019-10-17 DIAGNOSIS — K219 Gastro-esophageal reflux disease without esophagitis: Secondary | ICD-10-CM | POA: Diagnosis not present

## 2019-10-17 DIAGNOSIS — R1310 Dysphagia, unspecified: Secondary | ICD-10-CM | POA: Diagnosis not present

## 2019-10-17 DIAGNOSIS — R791 Abnormal coagulation profile: Secondary | ICD-10-CM | POA: Diagnosis not present

## 2019-10-17 DIAGNOSIS — R531 Weakness: Secondary | ICD-10-CM | POA: Diagnosis not present

## 2019-10-17 DIAGNOSIS — R319 Hematuria, unspecified: Secondary | ICD-10-CM | POA: Diagnosis not present

## 2019-10-17 DIAGNOSIS — N390 Urinary tract infection, site not specified: Secondary | ICD-10-CM | POA: Diagnosis not present

## 2019-10-17 DIAGNOSIS — R279 Unspecified lack of coordination: Secondary | ICD-10-CM | POA: Diagnosis not present

## 2019-10-17 DIAGNOSIS — K5909 Other constipation: Secondary | ICD-10-CM | POA: Diagnosis not present

## 2019-10-17 DIAGNOSIS — R5383 Other fatigue: Secondary | ICD-10-CM | POA: Diagnosis not present

## 2019-10-17 DIAGNOSIS — F320 Major depressive disorder, single episode, mild: Secondary | ICD-10-CM | POA: Diagnosis not present

## 2019-10-17 DIAGNOSIS — R2681 Unsteadiness on feet: Secondary | ICD-10-CM | POA: Diagnosis not present

## 2019-10-17 DIAGNOSIS — I69351 Hemiplegia and hemiparesis following cerebral infarction affecting right dominant side: Secondary | ICD-10-CM | POA: Diagnosis not present

## 2019-10-17 DIAGNOSIS — I10 Essential (primary) hypertension: Secondary | ICD-10-CM | POA: Diagnosis not present

## 2019-10-17 DIAGNOSIS — Z20828 Contact with and (suspected) exposure to other viral communicable diseases: Secondary | ICD-10-CM | POA: Diagnosis not present

## 2019-10-17 DIAGNOSIS — G459 Transient cerebral ischemic attack, unspecified: Secondary | ICD-10-CM | POA: Diagnosis not present

## 2019-10-17 DIAGNOSIS — E1122 Type 2 diabetes mellitus with diabetic chronic kidney disease: Secondary | ICD-10-CM | POA: Diagnosis not present

## 2019-10-17 DIAGNOSIS — K449 Diaphragmatic hernia without obstruction or gangrene: Secondary | ICD-10-CM | POA: Diagnosis not present

## 2019-10-17 DIAGNOSIS — I639 Cerebral infarction, unspecified: Secondary | ICD-10-CM | POA: Diagnosis not present

## 2019-10-17 DIAGNOSIS — K560 Paralytic ileus: Secondary | ICD-10-CM | POA: Diagnosis not present

## 2019-10-17 DIAGNOSIS — E860 Dehydration: Secondary | ICD-10-CM | POA: Diagnosis not present

## 2019-10-17 DIAGNOSIS — Z66 Do not resuscitate: Secondary | ICD-10-CM | POA: Diagnosis not present

## 2019-10-17 DIAGNOSIS — I5030 Unspecified diastolic (congestive) heart failure: Secondary | ICD-10-CM | POA: Diagnosis not present

## 2019-10-17 DIAGNOSIS — E785 Hyperlipidemia, unspecified: Secondary | ICD-10-CM | POA: Diagnosis not present

## 2019-10-17 DIAGNOSIS — Z03818 Encounter for observation for suspected exposure to other biological agents ruled out: Secondary | ICD-10-CM | POA: Diagnosis not present

## 2019-10-17 DIAGNOSIS — R109 Unspecified abdominal pain: Secondary | ICD-10-CM | POA: Diagnosis not present

## 2019-10-17 DIAGNOSIS — D649 Anemia, unspecified: Secondary | ICD-10-CM | POA: Diagnosis not present

## 2019-10-17 DIAGNOSIS — I69920 Aphasia following unspecified cerebrovascular disease: Secondary | ICD-10-CM | POA: Diagnosis not present

## 2019-10-17 LAB — GLUCOSE, CAPILLARY
Glucose-Capillary: 100 mg/dL — ABNORMAL HIGH (ref 70–99)
Glucose-Capillary: 101 mg/dL — ABNORMAL HIGH (ref 70–99)
Glucose-Capillary: 115 mg/dL — ABNORMAL HIGH (ref 70–99)
Glucose-Capillary: 99 mg/dL (ref 70–99)

## 2019-10-17 LAB — PROTIME-INR
INR: 2.7 — ABNORMAL HIGH (ref 0.8–1.2)
Prothrombin Time: 27.5 seconds — ABNORMAL HIGH (ref 11.4–15.2)

## 2019-10-17 MED ORDER — MELATONIN 3 MG PO TABS: 3.0000 mg | ORAL_TABLET | Freq: Every day | ORAL | 0 refills | Status: DC

## 2019-10-17 MED ORDER — WARFARIN SODIUM 1 MG PO TABS
1.0000 mg | ORAL_TABLET | Freq: Once | ORAL | Status: DC
  Filled 2019-10-17: qty 1

## 2019-10-17 MED ORDER — CEPHALEXIN 500 MG PO CAPS: 500.0000 mg | ORAL_CAPSULE | Freq: Three times a day (TID) | ORAL | Status: AC

## 2019-10-17 MED ORDER — CEPHALEXIN 500 MG PO CAPS
500.0000 mg | ORAL_CAPSULE | Freq: Three times a day (TID) | ORAL | Status: DC
  Administered 2019-10-17: 500 mg via ORAL
  Filled 2019-10-17: qty 1

## 2019-10-17 MED ORDER — ATORVASTATIN CALCIUM 20 MG PO TABS: 20.0000 mg | ORAL_TABLET | Freq: Every day | ORAL | Status: DC

## 2019-10-17 MED ORDER — ASPIRIN 81 MG PO TBEC: 81.0000 mg | DELAYED_RELEASE_TABLET | Freq: Every day | ORAL | Status: DC

## 2019-10-17 NOTE — Discharge Summary (Signed)
degeneration. Only occasional chronic micro hemorrhages. No midline shift, mass effect, evidence of mass lesion, ventriculomegaly, extra-axial collection or acute intracranial hemorrhage. Cervicomedullary junction and pituitary are within normal limits. Vascular: Major intracranial vascular flow voids are stable since 2019 with generalized intracranial artery tortuosity. Skull and upper cervical spine: Negative for age visible cervical spine, bone marrow signal. Sinuses/Orbits: Stable and negative. Other: Mastoids remain well pneumatized. Visible internal auditory structures appear normal. Scalp and face soft tissues appear negative. IMPRESSION: 1. Acute lacunar infarct in  the left pons. No associated hemorrhage or mass effect. 2. Subacute white matter lacunar infarct in the right centrum semiovale, likely synchronous small vessel disease (see #3). 3. Underlying advanced chronic ischemic disease otherwise stable since 03/21/2018. Electronically Signed   By: Odessa Fleming M.D.   On: 10/13/2019 18:32   DG Swallowing Func-Speech Pathology  Result Date: 10/14/2019 Objective Swallowing Evaluation: Type of Study: MBS-Modified Barium Swallow Study  Patient Details Name: Lisa Hopkins MRN: [id number] Date of Birth: 1924-09-09 Today's Date: 10/14/2019 Time: SLP Start Time (ACUTE ONLY): 1220 -SLP Stop Time (ACUTE ONLY): 1246 SLP Time Calculation (min) (ACUTE ONLY): 26 min Past Medical History: Past Medical History: Diagnosis Date . Anxiety  . Aphasia as late effect of cerebrovascular accident  . Atrial fibrillation (HCC)  . CKD (chronic kidney disease), stage II  . CVA (cerebral infarction)  . Diabetes mellitus type II  . DVT, lower extremity, distal, acute, right (HCC) 03/25/2018  Right peroneal vein 03/22/2018 . GERD (gastroesophageal reflux disease)  . Hiatal hernia  . HTN (hypertension)  . Hyperlipidemia  . IBS (irritable bowel syndrome)  . Stroke (HCC)  . Vitamin D deficiency  Past Surgical History: Past Surgical History: Procedure Laterality Date . CATARACT EXTRACTION, BILATERAL   . CHOLECYSTECTOMY   . COLONOSCOPY  2005 . ESOPHAGOGASTRODUODENOSCOPY (EGD) WITH PROPOFOL N/A 11/28/2017  Procedure: ESOPHAGOGASTRODUODENOSCOPY (EGD) WITH PROPOFOL;  Surgeon: Graylin Shiver, MD;  Location: [facility] ENDOSCOPY;  Service: Endoscopy;  Laterality: N/A; . TONSILLECTOMY   . UPPER GASTROINTESTINAL ENDOSCOPY  2005 HPI: Pt is a 86 yo female presenting after six days of R sided weakness. MRI revealed an acute lacunar infarct in the L pons. Pt had previous MBS in 2011, recommending nectar thick liquids given aspiration of thin liquids. More recent BSE in 2019 reported normal appearing oropharyngeal swallow but with  more symptomatic esophageal dysphagia. PMH includes: large HH, GERD, esophageal stricture, CVA, aphasia, IBS, HLD, HTN, DMII, CKD, Afib, anxiety  Subjective: pt alert, aphasic Assessment / Plan / Recommendation CHL IP CLINICAL IMPRESSIONS 10/14/2019 Clinical Impression Pt presents with what is likely an acute on chronic dysphagia with oropharyngeal component on top of baseline esophageal issues. In addition to documented esophageal hx, she appears to have a small diverticulum in her proximal esophagus (MD not present to confirm) that has mild residue but no backflow into her pharynx. Orally pt has decreased lingual control, with good clearance of boluses but with decreased cohesion. She has impaired coordination as she initiates oral transit and pharyngeal trigger, resulting in frequent aspiration of thin and nectar thick liquids before and at times during the swallow. This often triggers a cough, but it is too weak to be effective. Penetrates can be cleared with a cued throat clear. Attempted a chin tuck to better contain boluses before swallowing, but this did not improve airway protection with thin liquids. With nectar thick liquids there was a single instance of aspiration as a small amount of penetration reached past the vocal folds, but this  Physician Discharge Summary  Lisa Hopkins WUJ:[id number] DOB: [birth year] DOA: 10/13/2019  PCP: Lucky Cowboy, MD  Admit date: 10/13/2019 Discharge date: 10/17/2019  Admitted From: Home Discharge disposition: SNF   Code Status: DNR  Diet Recommendation: Cardiac diet   Recommendations for Outpatient Follow-Up:   1. Follow-up with PCP as an outpatient 2. Follow-up with neurology as an outpatient  Discharge Diagnosis:   Principal Problem:   Acute CVA (cerebrovascular accident) [facility]) Active Problems:   Essential hypertension   Atrial fibrillation (HCC)   Diet-controlled type 2 diabetes mellitus (HCC)   CKD stage 2 due to type 2 diabetes mellitus (HCC)   History of Present Illness / Brief narrative:  Lisa Hopkins is a 86 y.o. female with PMH of of CVA,permanentA. fibon Coumadin,hypertension. Patient was brought to the ED by her son on 5/17 for persistent right-sided weakness.Per patient's son,symptoms started on Oct 07, 2019 when patient had weakness of the right lower extremity.  She was taken to her primary care physician and was prescribed prednisone for possible pinched nerve. Following that, patient started developing weakness of the right upper extremity which progressively worsened. She was brought to the ED for further evaluation.  In the ED, MRI of the brain shows acute left pontine infarct and infarct in the right semiovale. Seen by neurology/stroke team Dr. Otelia Limes.  Patient was admitted to hospitalist service for further evaluation management.   Labs revealedtherapeutic INR of 2.3. EKG showed A. fib flutter. Rate controlled.  WBC was 13.1K, Urinalysis on admission showed hazy yellow urine with large leukocytes and positive nitrite.     Hospital Course:  Acute bilateral stroke  -Presented with 6-day history of right-sided weakness. -MRI brain showed lacunar infarct in the left pons and subacute white matter lacunar infarct in the right  centrum -additionally MRI showed advanced chronic ischemic disease, diffuse atrophy, chronic left frontal lobe medium-sized ischemic infarction and chronic right cerebellar medium-sized/ischemic infarctions. -Followed by neuro stroke team -CTA head and neck negative for large vessel occlusion,but showing severe stenosis of the left PCA distal P2 and PCA bifurcation. -Currently on warfarin, continue per neurology.  Target INR 2-3. -LDL 124, goal less than 70 -Hemoglobin A1c 6.2, goal less than 7.0, at goal -2D echo LVEF 65 to 70%, no PFO, no source of emboli  -Patient has an insensitivity to statin,causing elevated CPK,therefore was started on low-dose Lipitor per neuro,neurology discussed with son who is okay with it. -Neurology added aspirin 81 mg daily on top of Coumadin.  Continue Lipitor 20 mg daily as recommended.  Dysphagia Speech therapist post MBS recommends dysphagia 2 diet, fine chopped solids with nectar thick liquid  Ambulatory dysfunction in the setting of acute CVA PT OT with recommendation for SNF TOC assisting with SNF placement Continue PT OT with assistance and fall precautions.  Hyperlipidemia LDL 124, goal less than 70 Continue Lipitor 20 mg daily Started on low-dose statin due to insensitivity to statin, causing elevated CPK Neurology discussed withher son,okay tostart it  Permanent A. fib -At admission, patient was on metoprolol 25 mg twice daily.  Currently on half dose at 12.5 mg twice daily, heart rate is controlled.  Since that has episodes of bradycardia to 50s.    Essential hypertension Prior to admission, patient was on metoprolol 25 mg daily, Lasix 20 mg daily and losartan 50 mg daily.  Currently only on metoprolol 12.5 mg twice daily.   -Blood pressure trending up in 160s today.  Permissive hypertension allowed so far.  I  degeneration. Only occasional chronic micro hemorrhages. No midline shift, mass effect, evidence of mass lesion, ventriculomegaly, extra-axial collection or acute intracranial hemorrhage. Cervicomedullary junction and pituitary are within normal limits. Vascular: Major intracranial vascular flow voids are stable since 2019 with generalized intracranial artery tortuosity. Skull and upper cervical spine: Negative for age visible cervical spine, bone marrow signal. Sinuses/Orbits: Stable and negative. Other: Mastoids remain well pneumatized. Visible internal auditory structures appear normal. Scalp and face soft tissues appear negative. IMPRESSION: 1. Acute lacunar infarct in  the left pons. No associated hemorrhage or mass effect. 2. Subacute white matter lacunar infarct in the right centrum semiovale, likely synchronous small vessel disease (see #3). 3. Underlying advanced chronic ischemic disease otherwise stable since 03/21/2018. Electronically Signed   By: Odessa Fleming M.D.   On: 10/13/2019 18:32   DG Swallowing Func-Speech Pathology  Result Date: 10/14/2019 Objective Swallowing Evaluation: Type of Study: MBS-Modified Barium Swallow Study  Patient Details Name: Lisa Hopkins MRN: [id number] Date of Birth: 1924-09-09 Today's Date: 10/14/2019 Time: SLP Start Time (ACUTE ONLY): 1220 -SLP Stop Time (ACUTE ONLY): 1246 SLP Time Calculation (min) (ACUTE ONLY): 26 min Past Medical History: Past Medical History: Diagnosis Date . Anxiety  . Aphasia as late effect of cerebrovascular accident  . Atrial fibrillation (HCC)  . CKD (chronic kidney disease), stage II  . CVA (cerebral infarction)  . Diabetes mellitus type II  . DVT, lower extremity, distal, acute, right (HCC) 03/25/2018  Right peroneal vein 03/22/2018 . GERD (gastroesophageal reflux disease)  . Hiatal hernia  . HTN (hypertension)  . Hyperlipidemia  . IBS (irritable bowel syndrome)  . Stroke (HCC)  . Vitamin D deficiency  Past Surgical History: Past Surgical History: Procedure Laterality Date . CATARACT EXTRACTION, BILATERAL   . CHOLECYSTECTOMY   . COLONOSCOPY  2005 . ESOPHAGOGASTRODUODENOSCOPY (EGD) WITH PROPOFOL N/A 11/28/2017  Procedure: ESOPHAGOGASTRODUODENOSCOPY (EGD) WITH PROPOFOL;  Surgeon: Graylin Shiver, MD;  Location: [facility] ENDOSCOPY;  Service: Endoscopy;  Laterality: N/A; . TONSILLECTOMY   . UPPER GASTROINTESTINAL ENDOSCOPY  2005 HPI: Pt is a 86 yo female presenting after six days of R sided weakness. MRI revealed an acute lacunar infarct in the L pons. Pt had previous MBS in 2011, recommending nectar thick liquids given aspiration of thin liquids. More recent BSE in 2019 reported normal appearing oropharyngeal swallow but with  more symptomatic esophageal dysphagia. PMH includes: large HH, GERD, esophageal stricture, CVA, aphasia, IBS, HLD, HTN, DMII, CKD, Afib, anxiety  Subjective: pt alert, aphasic Assessment / Plan / Recommendation CHL IP CLINICAL IMPRESSIONS 10/14/2019 Clinical Impression Pt presents with what is likely an acute on chronic dysphagia with oropharyngeal component on top of baseline esophageal issues. In addition to documented esophageal hx, she appears to have a small diverticulum in her proximal esophagus (MD not present to confirm) that has mild residue but no backflow into her pharynx. Orally pt has decreased lingual control, with good clearance of boluses but with decreased cohesion. She has impaired coordination as she initiates oral transit and pharyngeal trigger, resulting in frequent aspiration of thin and nectar thick liquids before and at times during the swallow. This often triggers a cough, but it is too weak to be effective. Penetrates can be cleared with a cued throat clear. Attempted a chin tuck to better contain boluses before swallowing, but this did not improve airway protection with thin liquids. With nectar thick liquids there was a single instance of aspiration as a small amount of penetration reached past the vocal folds, but this  PHASE 10/14/2019 Cervical Esophageal Phase WFL Pudding Teaspoon -- Pudding Cup -- Honey Teaspoon -- Honey Cup -- Nectar Teaspoon --  Nectar Cup -- Nectar Straw -- Thin Teaspoon -- Thin Cup -- Thin Straw -- Puree -- Mechanical Soft -- Regular -- Multi-consistency -- Pill -- Cervical Esophageal Comment -- Mahala Menghini., M.A. CCC-SLP Acute Rehabilitation Services Pager [phone] Office [phone] 10/14/2019, 3:29 PM              ECHOCARDIOGRAM COMPLETE  Result Date: 10/14/2019    ECHOCARDIOGRAM REPORT   Patient Name:   Lisa Hopkins [facility] Date of Exam: 10/14/2019 Medical Rec #:  [id number]          Height:       64.0 in Accession #:    [id number]         Weight:       117.0 lb Date of Birth:  12/13/1924          BSA:          1.558 m Patient Age:    86 years           BP:           168/97 mmHg Patient Gender: F                  HR:           73 bpm. Exam Location:  Inpatient Procedure: 2D Echo, Color Doppler and Cardiac Doppler Indications:    Stroke i163.9  History:        Patient has prior history of Echocardiogram examinations, most                 recent 12/01/2017. CHF, Arrythmias:Atrial Fibrillation; Risk                 Factors:Hypertension, Diabetes and Dyslipidemia.  Sonographer:    Irving Burton Senior RDCS Referring Phys: [id number] ARSHAD N KAKRAKANDY IMPRESSIONS  1. Left ventricular ejection fraction, by estimation, is 65 to 70%. The left ventricle has normal function. The left ventricle has no regional wall motion abnormalities. There is mild left ventricular hypertrophy. Left ventricular diastolic function could not be evaluated.  2. Right ventricular systolic function is low normal. The right ventricular size is normal.  3. Left atrial size was mildly dilated.  4. The mitral valve is abnormal. Mild mitral valve regurgitation.  5. The aortic valve is tricuspid. Aortic valve regurgitation is not visualized. Mild to moderate aortic valve sclerosis/calcification is present, without any evidence of aortic stenosis. FINDINGS  Left Ventricle: Left ventricular ejection fraction, by estimation, is 65 to 70%. The left ventricle has normal function. The left  ventricle has no regional wall motion abnormalities. The left ventricular internal cavity size was normal in size. There is  mild left ventricular hypertrophy. Left ventricular diastolic function could not be evaluated due to atrial fibrillation. Left ventricular diastolic function could not be evaluated. Indeterminate filling pressures. Right Ventricle: The right ventricular size is normal. No increase in right ventricular wall thickness. Right ventricular systolic function is low normal. Left Atrium: Left atrial size was mildly dilated. Right Atrium: Right atrial size was normal in size. Pericardium: There is no evidence of pericardial effusion. Mitral Valve: The mitral valve is abnormal. Mild mitral annular calcification. Mild mitral valve regurgitation. Tricuspid Valve: The tricuspid valve is grossly normal. Tricuspid valve regurgitation is mild. Aortic Valve: The aortic valve is tricuspid. Aortic valve regurgitation is not visualized. Mild to moderate aortic valve sclerosis/calcification is present, without any  Physician Discharge Summary  Lisa Hopkins WUJ:[id number] DOB: [birth year] DOA: 10/13/2019  PCP: Lucky Cowboy, MD  Admit date: 10/13/2019 Discharge date: 10/17/2019  Admitted From: Home Discharge disposition: SNF   Code Status: DNR  Diet Recommendation: Cardiac diet   Recommendations for Outpatient Follow-Up:   1. Follow-up with PCP as an outpatient 2. Follow-up with neurology as an outpatient  Discharge Diagnosis:   Principal Problem:   Acute CVA (cerebrovascular accident) [facility]) Active Problems:   Essential hypertension   Atrial fibrillation (HCC)   Diet-controlled type 2 diabetes mellitus (HCC)   CKD stage 2 due to type 2 diabetes mellitus (HCC)   History of Present Illness / Brief narrative:  Lisa Hopkins is a 86 y.o. female with PMH of of CVA,permanentA. fibon Coumadin,hypertension. Patient was brought to the ED by her son on 5/17 for persistent right-sided weakness.Per patient's son,symptoms started on Oct 07, 2019 when patient had weakness of the right lower extremity.  She was taken to her primary care physician and was prescribed prednisone for possible pinched nerve. Following that, patient started developing weakness of the right upper extremity which progressively worsened. She was brought to the ED for further evaluation.  In the ED, MRI of the brain shows acute left pontine infarct and infarct in the right semiovale. Seen by neurology/stroke team Dr. Otelia Limes.  Patient was admitted to hospitalist service for further evaluation management.   Labs revealedtherapeutic INR of 2.3. EKG showed A. fib flutter. Rate controlled.  WBC was 13.1K, Urinalysis on admission showed hazy yellow urine with large leukocytes and positive nitrite.     Hospital Course:  Acute bilateral stroke  -Presented with 6-day history of right-sided weakness. -MRI brain showed lacunar infarct in the left pons and subacute white matter lacunar infarct in the right  centrum -additionally MRI showed advanced chronic ischemic disease, diffuse atrophy, chronic left frontal lobe medium-sized ischemic infarction and chronic right cerebellar medium-sized/ischemic infarctions. -Followed by neuro stroke team -CTA head and neck negative for large vessel occlusion,but showing severe stenosis of the left PCA distal P2 and PCA bifurcation. -Currently on warfarin, continue per neurology.  Target INR 2-3. -LDL 124, goal less than 70 -Hemoglobin A1c 6.2, goal less than 7.0, at goal -2D echo LVEF 65 to 70%, no PFO, no source of emboli  -Patient has an insensitivity to statin,causing elevated CPK,therefore was started on low-dose Lipitor per neuro,neurology discussed with son who is okay with it. -Neurology added aspirin 81 mg daily on top of Coumadin.  Continue Lipitor 20 mg daily as recommended.  Dysphagia Speech therapist post MBS recommends dysphagia 2 diet, fine chopped solids with nectar thick liquid  Ambulatory dysfunction in the setting of acute CVA PT OT with recommendation for SNF TOC assisting with SNF placement Continue PT OT with assistance and fall precautions.  Hyperlipidemia LDL 124, goal less than 70 Continue Lipitor 20 mg daily Started on low-dose statin due to insensitivity to statin, causing elevated CPK Neurology discussed withher son,okay tostart it  Permanent A. fib -At admission, patient was on metoprolol 25 mg twice daily.  Currently on half dose at 12.5 mg twice daily, heart rate is controlled.  Since that has episodes of bradycardia to 50s.    Essential hypertension Prior to admission, patient was on metoprolol 25 mg daily, Lasix 20 mg daily and losartan 50 mg daily.  Currently only on metoprolol 12.5 mg twice daily.   -Blood pressure trending up in 160s today.  Permissive hypertension allowed so far.  I  PHASE 10/14/2019 Cervical Esophageal Phase WFL Pudding Teaspoon -- Pudding Cup -- Honey Teaspoon -- Honey Cup -- Nectar Teaspoon --  Nectar Cup -- Nectar Straw -- Thin Teaspoon -- Thin Cup -- Thin Straw -- Puree -- Mechanical Soft -- Regular -- Multi-consistency -- Pill -- Cervical Esophageal Comment -- Mahala Menghini., M.A. CCC-SLP Acute Rehabilitation Services Pager [phone] Office [phone] 10/14/2019, 3:29 PM              ECHOCARDIOGRAM COMPLETE  Result Date: 10/14/2019    ECHOCARDIOGRAM REPORT   Patient Name:   Lisa Hopkins [facility] Date of Exam: 10/14/2019 Medical Rec #:  [id number]          Height:       64.0 in Accession #:    [id number]         Weight:       117.0 lb Date of Birth:  12/13/1924          BSA:          1.558 m Patient Age:    86 years           BP:           168/97 mmHg Patient Gender: F                  HR:           73 bpm. Exam Location:  Inpatient Procedure: 2D Echo, Color Doppler and Cardiac Doppler Indications:    Stroke i163.9  History:        Patient has prior history of Echocardiogram examinations, most                 recent 12/01/2017. CHF, Arrythmias:Atrial Fibrillation; Risk                 Factors:Hypertension, Diabetes and Dyslipidemia.  Sonographer:    Irving Burton Senior RDCS Referring Phys: [id number] ARSHAD N KAKRAKANDY IMPRESSIONS  1. Left ventricular ejection fraction, by estimation, is 65 to 70%. The left ventricle has normal function. The left ventricle has no regional wall motion abnormalities. There is mild left ventricular hypertrophy. Left ventricular diastolic function could not be evaluated.  2. Right ventricular systolic function is low normal. The right ventricular size is normal.  3. Left atrial size was mildly dilated.  4. The mitral valve is abnormal. Mild mitral valve regurgitation.  5. The aortic valve is tricuspid. Aortic valve regurgitation is not visualized. Mild to moderate aortic valve sclerosis/calcification is present, without any evidence of aortic stenosis. FINDINGS  Left Ventricle: Left ventricular ejection fraction, by estimation, is 65 to 70%. The left ventricle has normal function. The left  ventricle has no regional wall motion abnormalities. The left ventricular internal cavity size was normal in size. There is  mild left ventricular hypertrophy. Left ventricular diastolic function could not be evaluated due to atrial fibrillation. Left ventricular diastolic function could not be evaluated. Indeterminate filling pressures. Right Ventricle: The right ventricular size is normal. No increase in right ventricular wall thickness. Right ventricular systolic function is low normal. Left Atrium: Left atrial size was mildly dilated. Right Atrium: Right atrial size was normal in size. Pericardium: There is no evidence of pericardial effusion. Mitral Valve: The mitral valve is abnormal. Mild mitral annular calcification. Mild mitral valve regurgitation. Tricuspid Valve: The tricuspid valve is grossly normal. Tricuspid valve regurgitation is mild. Aortic Valve: The aortic valve is tricuspid. Aortic valve regurgitation is not visualized. Mild to moderate aortic valve sclerosis/calcification is present, without any  Physician Discharge Summary  Lisa Hopkins WUJ:[id number] DOB: [birth year] DOA: 10/13/2019  PCP: Lucky Cowboy, MD  Admit date: 10/13/2019 Discharge date: 10/17/2019  Admitted From: Home Discharge disposition: SNF   Code Status: DNR  Diet Recommendation: Cardiac diet   Recommendations for Outpatient Follow-Up:   1. Follow-up with PCP as an outpatient 2. Follow-up with neurology as an outpatient  Discharge Diagnosis:   Principal Problem:   Acute CVA (cerebrovascular accident) [facility]) Active Problems:   Essential hypertension   Atrial fibrillation (HCC)   Diet-controlled type 2 diabetes mellitus (HCC)   CKD stage 2 due to type 2 diabetes mellitus (HCC)   History of Present Illness / Brief narrative:  Lisa Hopkins is a 86 y.o. female with PMH of of CVA,permanentA. fibon Coumadin,hypertension. Patient was brought to the ED by her son on 5/17 for persistent right-sided weakness.Per patient's son,symptoms started on Oct 07, 2019 when patient had weakness of the right lower extremity.  She was taken to her primary care physician and was prescribed prednisone for possible pinched nerve. Following that, patient started developing weakness of the right upper extremity which progressively worsened. She was brought to the ED for further evaluation.  In the ED, MRI of the brain shows acute left pontine infarct and infarct in the right semiovale. Seen by neurology/stroke team Dr. Otelia Limes.  Patient was admitted to hospitalist service for further evaluation management.   Labs revealedtherapeutic INR of 2.3. EKG showed A. fib flutter. Rate controlled.  WBC was 13.1K, Urinalysis on admission showed hazy yellow urine with large leukocytes and positive nitrite.     Hospital Course:  Acute bilateral stroke  -Presented with 6-day history of right-sided weakness. -MRI brain showed lacunar infarct in the left pons and subacute white matter lacunar infarct in the right  centrum -additionally MRI showed advanced chronic ischemic disease, diffuse atrophy, chronic left frontal lobe medium-sized ischemic infarction and chronic right cerebellar medium-sized/ischemic infarctions. -Followed by neuro stroke team -CTA head and neck negative for large vessel occlusion,but showing severe stenosis of the left PCA distal P2 and PCA bifurcation. -Currently on warfarin, continue per neurology.  Target INR 2-3. -LDL 124, goal less than 70 -Hemoglobin A1c 6.2, goal less than 7.0, at goal -2D echo LVEF 65 to 70%, no PFO, no source of emboli  -Patient has an insensitivity to statin,causing elevated CPK,therefore was started on low-dose Lipitor per neuro,neurology discussed with son who is okay with it. -Neurology added aspirin 81 mg daily on top of Coumadin.  Continue Lipitor 20 mg daily as recommended.  Dysphagia Speech therapist post MBS recommends dysphagia 2 diet, fine chopped solids with nectar thick liquid  Ambulatory dysfunction in the setting of acute CVA PT OT with recommendation for SNF TOC assisting with SNF placement Continue PT OT with assistance and fall precautions.  Hyperlipidemia LDL 124, goal less than 70 Continue Lipitor 20 mg daily Started on low-dose statin due to insensitivity to statin, causing elevated CPK Neurology discussed withher son,okay tostart it  Permanent A. fib -At admission, patient was on metoprolol 25 mg twice daily.  Currently on half dose at 12.5 mg twice daily, heart rate is controlled.  Since that has episodes of bradycardia to 50s.    Essential hypertension Prior to admission, patient was on metoprolol 25 mg daily, Lasix 20 mg daily and losartan 50 mg daily.  Currently only on metoprolol 12.5 mg twice daily.   -Blood pressure trending up in 160s today.  Permissive hypertension allowed so far.  I  Physician Discharge Summary  Lisa Hopkins WUJ:[id number] DOB: [birth year] DOA: 10/13/2019  PCP: Lucky Cowboy, MD  Admit date: 10/13/2019 Discharge date: 10/17/2019  Admitted From: Home Discharge disposition: SNF   Code Status: DNR  Diet Recommendation: Cardiac diet   Recommendations for Outpatient Follow-Up:   1. Follow-up with PCP as an outpatient 2. Follow-up with neurology as an outpatient  Discharge Diagnosis:   Principal Problem:   Acute CVA (cerebrovascular accident) [facility]) Active Problems:   Essential hypertension   Atrial fibrillation (HCC)   Diet-controlled type 2 diabetes mellitus (HCC)   CKD stage 2 due to type 2 diabetes mellitus (HCC)   History of Present Illness / Brief narrative:  Lisa Hopkins is a 86 y.o. female with PMH of of CVA,permanentA. fibon Coumadin,hypertension. Patient was brought to the ED by her son on 5/17 for persistent right-sided weakness.Per patient's son,symptoms started on Oct 07, 2019 when patient had weakness of the right lower extremity.  She was taken to her primary care physician and was prescribed prednisone for possible pinched nerve. Following that, patient started developing weakness of the right upper extremity which progressively worsened. She was brought to the ED for further evaluation.  In the ED, MRI of the brain shows acute left pontine infarct and infarct in the right semiovale. Seen by neurology/stroke team Dr. Otelia Limes.  Patient was admitted to hospitalist service for further evaluation management.   Labs revealedtherapeutic INR of 2.3. EKG showed A. fib flutter. Rate controlled.  WBC was 13.1K, Urinalysis on admission showed hazy yellow urine with large leukocytes and positive nitrite.     Hospital Course:  Acute bilateral stroke  -Presented with 6-day history of right-sided weakness. -MRI brain showed lacunar infarct in the left pons and subacute white matter lacunar infarct in the right  centrum -additionally MRI showed advanced chronic ischemic disease, diffuse atrophy, chronic left frontal lobe medium-sized ischemic infarction and chronic right cerebellar medium-sized/ischemic infarctions. -Followed by neuro stroke team -CTA head and neck negative for large vessel occlusion,but showing severe stenosis of the left PCA distal P2 and PCA bifurcation. -Currently on warfarin, continue per neurology.  Target INR 2-3. -LDL 124, goal less than 70 -Hemoglobin A1c 6.2, goal less than 7.0, at goal -2D echo LVEF 65 to 70%, no PFO, no source of emboli  -Patient has an insensitivity to statin,causing elevated CPK,therefore was started on low-dose Lipitor per neuro,neurology discussed with son who is okay with it. -Neurology added aspirin 81 mg daily on top of Coumadin.  Continue Lipitor 20 mg daily as recommended.  Dysphagia Speech therapist post MBS recommends dysphagia 2 diet, fine chopped solids with nectar thick liquid  Ambulatory dysfunction in the setting of acute CVA PT OT with recommendation for SNF TOC assisting with SNF placement Continue PT OT with assistance and fall precautions.  Hyperlipidemia LDL 124, goal less than 70 Continue Lipitor 20 mg daily Started on low-dose statin due to insensitivity to statin, causing elevated CPK Neurology discussed withher son,okay tostart it  Permanent A. fib -At admission, patient was on metoprolol 25 mg twice daily.  Currently on half dose at 12.5 mg twice daily, heart rate is controlled.  Since that has episodes of bradycardia to 50s.    Essential hypertension Prior to admission, patient was on metoprolol 25 mg daily, Lasix 20 mg daily and losartan 50 mg daily.  Currently only on metoprolol 12.5 mg twice daily.   -Blood pressure trending up in 160s today.  Permissive hypertension allowed so far.  I  Physician Discharge Summary  Lisa Hopkins WUJ:[id number] DOB: [birth year] DOA: 10/13/2019  PCP: Lucky Cowboy, MD  Admit date: 10/13/2019 Discharge date: 10/17/2019  Admitted From: Home Discharge disposition: SNF   Code Status: DNR  Diet Recommendation: Cardiac diet   Recommendations for Outpatient Follow-Up:   1. Follow-up with PCP as an outpatient 2. Follow-up with neurology as an outpatient  Discharge Diagnosis:   Principal Problem:   Acute CVA (cerebrovascular accident) [facility]) Active Problems:   Essential hypertension   Atrial fibrillation (HCC)   Diet-controlled type 2 diabetes mellitus (HCC)   CKD stage 2 due to type 2 diabetes mellitus (HCC)   History of Present Illness / Brief narrative:  Lisa Hopkins is a 86 y.o. female with PMH of of CVA,permanentA. fibon Coumadin,hypertension. Patient was brought to the ED by her son on 5/17 for persistent right-sided weakness.Per patient's son,symptoms started on Oct 07, 2019 when patient had weakness of the right lower extremity.  She was taken to her primary care physician and was prescribed prednisone for possible pinched nerve. Following that, patient started developing weakness of the right upper extremity which progressively worsened. She was brought to the ED for further evaluation.  In the ED, MRI of the brain shows acute left pontine infarct and infarct in the right semiovale. Seen by neurology/stroke team Dr. Otelia Limes.  Patient was admitted to hospitalist service for further evaluation management.   Labs revealedtherapeutic INR of 2.3. EKG showed A. fib flutter. Rate controlled.  WBC was 13.1K, Urinalysis on admission showed hazy yellow urine with large leukocytes and positive nitrite.     Hospital Course:  Acute bilateral stroke  -Presented with 6-day history of right-sided weakness. -MRI brain showed lacunar infarct in the left pons and subacute white matter lacunar infarct in the right  centrum -additionally MRI showed advanced chronic ischemic disease, diffuse atrophy, chronic left frontal lobe medium-sized ischemic infarction and chronic right cerebellar medium-sized/ischemic infarctions. -Followed by neuro stroke team -CTA head and neck negative for large vessel occlusion,but showing severe stenosis of the left PCA distal P2 and PCA bifurcation. -Currently on warfarin, continue per neurology.  Target INR 2-3. -LDL 124, goal less than 70 -Hemoglobin A1c 6.2, goal less than 7.0, at goal -2D echo LVEF 65 to 70%, no PFO, no source of emboli  -Patient has an insensitivity to statin,causing elevated CPK,therefore was started on low-dose Lipitor per neuro,neurology discussed with son who is okay with it. -Neurology added aspirin 81 mg daily on top of Coumadin.  Continue Lipitor 20 mg daily as recommended.  Dysphagia Speech therapist post MBS recommends dysphagia 2 diet, fine chopped solids with nectar thick liquid  Ambulatory dysfunction in the setting of acute CVA PT OT with recommendation for SNF TOC assisting with SNF placement Continue PT OT with assistance and fall precautions.  Hyperlipidemia LDL 124, goal less than 70 Continue Lipitor 20 mg daily Started on low-dose statin due to insensitivity to statin, causing elevated CPK Neurology discussed withher son,okay tostart it  Permanent A. fib -At admission, patient was on metoprolol 25 mg twice daily.  Currently on half dose at 12.5 mg twice daily, heart rate is controlled.  Since that has episodes of bradycardia to 50s.    Essential hypertension Prior to admission, patient was on metoprolol 25 mg daily, Lasix 20 mg daily and losartan 50 mg daily.  Currently only on metoprolol 12.5 mg twice daily.   -Blood pressure trending up in 160s today.  Permissive hypertension allowed so far.  I  PHASE 10/14/2019 Cervical Esophageal Phase WFL Pudding Teaspoon -- Pudding Cup -- Honey Teaspoon -- Honey Cup -- Nectar Teaspoon --  Nectar Cup -- Nectar Straw -- Thin Teaspoon -- Thin Cup -- Thin Straw -- Puree -- Mechanical Soft -- Regular -- Multi-consistency -- Pill -- Cervical Esophageal Comment -- Mahala Menghini., M.A. CCC-SLP Acute Rehabilitation Services Pager [phone] Office [phone] 10/14/2019, 3:29 PM              ECHOCARDIOGRAM COMPLETE  Result Date: 10/14/2019    ECHOCARDIOGRAM REPORT   Patient Name:   Lisa Hopkins [facility] Date of Exam: 10/14/2019 Medical Rec #:  [id number]          Height:       64.0 in Accession #:    [id number]         Weight:       117.0 lb Date of Birth:  12/13/1924          BSA:          1.558 m Patient Age:    86 years           BP:           168/97 mmHg Patient Gender: F                  HR:           73 bpm. Exam Location:  Inpatient Procedure: 2D Echo, Color Doppler and Cardiac Doppler Indications:    Stroke i163.9  History:        Patient has prior history of Echocardiogram examinations, most                 recent 12/01/2017. CHF, Arrythmias:Atrial Fibrillation; Risk                 Factors:Hypertension, Diabetes and Dyslipidemia.  Sonographer:    Irving Burton Senior RDCS Referring Phys: [id number] ARSHAD N KAKRAKANDY IMPRESSIONS  1. Left ventricular ejection fraction, by estimation, is 65 to 70%. The left ventricle has normal function. The left ventricle has no regional wall motion abnormalities. There is mild left ventricular hypertrophy. Left ventricular diastolic function could not be evaluated.  2. Right ventricular systolic function is low normal. The right ventricular size is normal.  3. Left atrial size was mildly dilated.  4. The mitral valve is abnormal. Mild mitral valve regurgitation.  5. The aortic valve is tricuspid. Aortic valve regurgitation is not visualized. Mild to moderate aortic valve sclerosis/calcification is present, without any evidence of aortic stenosis. FINDINGS  Left Ventricle: Left ventricular ejection fraction, by estimation, is 65 to 70%. The left ventricle has normal function. The left  ventricle has no regional wall motion abnormalities. The left ventricular internal cavity size was normal in size. There is  mild left ventricular hypertrophy. Left ventricular diastolic function could not be evaluated due to atrial fibrillation. Left ventricular diastolic function could not be evaluated. Indeterminate filling pressures. Right Ventricle: The right ventricular size is normal. No increase in right ventricular wall thickness. Right ventricular systolic function is low normal. Left Atrium: Left atrial size was mildly dilated. Right Atrium: Right atrial size was normal in size. Pericardium: There is no evidence of pericardial effusion. Mitral Valve: The mitral valve is abnormal. Mild mitral annular calcification. Mild mitral valve regurgitation. Tricuspid Valve: The tricuspid valve is grossly normal. Tricuspid valve regurgitation is mild. Aortic Valve: The aortic valve is tricuspid. Aortic valve regurgitation is not visualized. Mild to moderate aortic valve sclerosis/calcification is present, without any  PHASE 10/14/2019 Cervical Esophageal Phase WFL Pudding Teaspoon -- Pudding Cup -- Honey Teaspoon -- Honey Cup -- Nectar Teaspoon --  Nectar Cup -- Nectar Straw -- Thin Teaspoon -- Thin Cup -- Thin Straw -- Puree -- Mechanical Soft -- Regular -- Multi-consistency -- Pill -- Cervical Esophageal Comment -- Mahala Menghini., M.A. CCC-SLP Acute Rehabilitation Services Pager [phone] Office [phone] 10/14/2019, 3:29 PM              ECHOCARDIOGRAM COMPLETE  Result Date: 10/14/2019    ECHOCARDIOGRAM REPORT   Patient Name:   Lisa Hopkins [facility] Date of Exam: 10/14/2019 Medical Rec #:  [id number]          Height:       64.0 in Accession #:    [id number]         Weight:       117.0 lb Date of Birth:  12/13/1924          BSA:          1.558 m Patient Age:    86 years           BP:           168/97 mmHg Patient Gender: F                  HR:           73 bpm. Exam Location:  Inpatient Procedure: 2D Echo, Color Doppler and Cardiac Doppler Indications:    Stroke i163.9  History:        Patient has prior history of Echocardiogram examinations, most                 recent 12/01/2017. CHF, Arrythmias:Atrial Fibrillation; Risk                 Factors:Hypertension, Diabetes and Dyslipidemia.  Sonographer:    Irving Burton Senior RDCS Referring Phys: [id number] ARSHAD N KAKRAKANDY IMPRESSIONS  1. Left ventricular ejection fraction, by estimation, is 65 to 70%. The left ventricle has normal function. The left ventricle has no regional wall motion abnormalities. There is mild left ventricular hypertrophy. Left ventricular diastolic function could not be evaluated.  2. Right ventricular systolic function is low normal. The right ventricular size is normal.  3. Left atrial size was mildly dilated.  4. The mitral valve is abnormal. Mild mitral valve regurgitation.  5. The aortic valve is tricuspid. Aortic valve regurgitation is not visualized. Mild to moderate aortic valve sclerosis/calcification is present, without any evidence of aortic stenosis. FINDINGS  Left Ventricle: Left ventricular ejection fraction, by estimation, is 65 to 70%. The left ventricle has normal function. The left  ventricle has no regional wall motion abnormalities. The left ventricular internal cavity size was normal in size. There is  mild left ventricular hypertrophy. Left ventricular diastolic function could not be evaluated due to atrial fibrillation. Left ventricular diastolic function could not be evaluated. Indeterminate filling pressures. Right Ventricle: The right ventricular size is normal. No increase in right ventricular wall thickness. Right ventricular systolic function is low normal. Left Atrium: Left atrial size was mildly dilated. Right Atrium: Right atrial size was normal in size. Pericardium: There is no evidence of pericardial effusion. Mitral Valve: The mitral valve is abnormal. Mild mitral annular calcification. Mild mitral valve regurgitation. Tricuspid Valve: The tricuspid valve is grossly normal. Tricuspid valve regurgitation is mild. Aortic Valve: The aortic valve is tricuspid. Aortic valve regurgitation is not visualized. Mild to moderate aortic valve sclerosis/calcification is present, without any  Physician Discharge Summary  Lisa Hopkins WUJ:[id number] DOB: [birth year] DOA: 10/13/2019  PCP: Lucky Cowboy, MD  Admit date: 10/13/2019 Discharge date: 10/17/2019  Admitted From: Home Discharge disposition: SNF   Code Status: DNR  Diet Recommendation: Cardiac diet   Recommendations for Outpatient Follow-Up:   1. Follow-up with PCP as an outpatient 2. Follow-up with neurology as an outpatient  Discharge Diagnosis:   Principal Problem:   Acute CVA (cerebrovascular accident) [facility]) Active Problems:   Essential hypertension   Atrial fibrillation (HCC)   Diet-controlled type 2 diabetes mellitus (HCC)   CKD stage 2 due to type 2 diabetes mellitus (HCC)   History of Present Illness / Brief narrative:  Lisa Hopkins is a 86 y.o. female with PMH of of CVA,permanentA. fibon Coumadin,hypertension. Patient was brought to the ED by her son on 5/17 for persistent right-sided weakness.Per patient's son,symptoms started on Oct 07, 2019 when patient had weakness of the right lower extremity.  She was taken to her primary care physician and was prescribed prednisone for possible pinched nerve. Following that, patient started developing weakness of the right upper extremity which progressively worsened. She was brought to the ED for further evaluation.  In the ED, MRI of the brain shows acute left pontine infarct and infarct in the right semiovale. Seen by neurology/stroke team Dr. Otelia Limes.  Patient was admitted to hospitalist service for further evaluation management.   Labs revealedtherapeutic INR of 2.3. EKG showed A. fib flutter. Rate controlled.  WBC was 13.1K, Urinalysis on admission showed hazy yellow urine with large leukocytes and positive nitrite.     Hospital Course:  Acute bilateral stroke  -Presented with 6-day history of right-sided weakness. -MRI brain showed lacunar infarct in the left pons and subacute white matter lacunar infarct in the right  centrum -additionally MRI showed advanced chronic ischemic disease, diffuse atrophy, chronic left frontal lobe medium-sized ischemic infarction and chronic right cerebellar medium-sized/ischemic infarctions. -Followed by neuro stroke team -CTA head and neck negative for large vessel occlusion,but showing severe stenosis of the left PCA distal P2 and PCA bifurcation. -Currently on warfarin, continue per neurology.  Target INR 2-3. -LDL 124, goal less than 70 -Hemoglobin A1c 6.2, goal less than 7.0, at goal -2D echo LVEF 65 to 70%, no PFO, no source of emboli  -Patient has an insensitivity to statin,causing elevated CPK,therefore was started on low-dose Lipitor per neuro,neurology discussed with son who is okay with it. -Neurology added aspirin 81 mg daily on top of Coumadin.  Continue Lipitor 20 mg daily as recommended.  Dysphagia Speech therapist post MBS recommends dysphagia 2 diet, fine chopped solids with nectar thick liquid  Ambulatory dysfunction in the setting of acute CVA PT OT with recommendation for SNF TOC assisting with SNF placement Continue PT OT with assistance and fall precautions.  Hyperlipidemia LDL 124, goal less than 70 Continue Lipitor 20 mg daily Started on low-dose statin due to insensitivity to statin, causing elevated CPK Neurology discussed withher son,okay tostart it  Permanent A. fib -At admission, patient was on metoprolol 25 mg twice daily.  Currently on half dose at 12.5 mg twice daily, heart rate is controlled.  Since that has episodes of bradycardia to 50s.    Essential hypertension Prior to admission, patient was on metoprolol 25 mg daily, Lasix 20 mg daily and losartan 50 mg daily.  Currently only on metoprolol 12.5 mg twice daily.   -Blood pressure trending up in 160s today.  Permissive hypertension allowed so far.  I

## 2019-10-17 NOTE — Progress Notes (Signed)
[name] to be D/C'd Skilled nursing facility per MD order.  Discussed with the patient Family and all questions fully answered.  VSS, Skin clean, dry and intact without evidence of skin break down, no evidence of skin tears noted. IV catheter discontinued intact. Site without signs and symptoms of complications. Dressing and pressure applied.  An After Visit Summary was printed and given to PTAR.  D/c education completed with patient/family including follow up instructions, medication list, d/c activities limitations if indicated, with other d/c instructions as indicated by MD - patient able to verbalize understanding, all questions fully answered.  Will call report to the SNF.  Patient instructed to return to ED, call 911, or call MD for any changes in condition.   Patient escorted via Stretcher, and D/C to SNF via PTAR.  Melvenia Needles 10/17/2019 3:15 PM

## 2019-10-17 NOTE — Discharge Instructions (Signed)

## 2019-10-17 NOTE — Progress Notes (Signed)
ANTICOAGULATION CONSULT NOTE - Follow Up Consult  Pharmacy Consult for warfarin Indication: atrial fibrillation and stroke  Allergies  Allergen Reactions  . Ace Inhibitors Cough  . Fosamax [Alendronate Sodium] Other (See Comments)    Heart burn  . Norvasc [Amlodipine Besylate] Other (See Comments)    edema  . Zocor [Simvastatin] Other (See Comments)    Elevated CPK    Patient Measurements: Height: 5\' 4"  (162.6 cm) Weight: 53.1 kg (117 lb) IBW/kg (Calculated) : 54.7  Vital Signs: Temp: 97.6 F (36.4 C) (05/21 0750) Temp Source: Oral (05/21 0750) BP: 189/84 (05/21 0750) Pulse Rate: 63 (05/21 0750)  Labs: Recent Labs    10/15/19 0432 10/16/19 0211 10/17/19 0408  HGB 13.2  --   --   HCT 41.5  --   --   PLT 257  --   --   LABPROT 25.1* 30.6* 27.5*  INR 2.4* 3.1* 2.7*  CREATININE 0.85  --   --     Estimated Creatinine Clearance: 35.4 mL/min (by C-G formula based on SCr of 0.85 mg/dL).  Assessment:  86 yo female with h/o CVA and afib brought to ED on 10/13/19 for right sided weakness. Found to have left pontine infarct and right semiovate infarct. Pharmacy consulted to resume warfarin. Home regimen is 2 mg daily except 1 mg on Mondays and Fridays. INR on admit was 2.3 on 5/17 with last dose of warfarin given on 5/17.    INR increased 2.4 > 3.1 on 5/20 and warfarin dose held. INR 2.7 today.  Aspirin 81 mg daily added 5/18.  Goal of Therapy:  INR 2-3 Monitor platelets by anticoagulation protocol: Yes   Plan:   Warfarin 1 mg x 1 today. Usual Friday dose.  Daily PT/INR.  Monitor for s/sx bleeding.  Thursday, RPh Phone: [phone] 10/17/2019,10:53 AM

## 2019-10-17 NOTE — TOC Progression Note (Signed)
Transition of Care [facility]) - Progression Note    Patient Details  Name: Lisa Hopkins MRN: [id number] Date of Birth: 04-Sep-1924  Transition of Care [facility] CM/SW Contact  Mearl Latin, LCSW Phone Number: 10/17/2019, 10:20 AM  Clinical Narrative:    Insurance approval received: #[id number], next review 10/21/19. [facility] is ready for patient.    Expected Discharge Plan: Skilled Nursing Facility Barriers to Discharge: No Barriers Identified  Expected Discharge Plan and Services Expected Discharge Plan: Skilled Nursing Facility       Living arrangements for the past 2 months: Independent Living Facility                                       Social Determinants of Health (SDOH) Interventions    Readmission Risk Interventions No flowsheet data found.

## 2019-10-17 NOTE — TOC Transition Note (Signed)
Transition of Care [facility]) - CM/SW Discharge Note   Patient Details  Name: Lisa Hopkins MRN: [id number] Date of Birth: 01-28-1925  [facility] of [facility]) CM/SW Contact:  Mearl Latin, LCSW Phone Number: 10/17/2019, 12:58 PM   Clinical Narrative:    Patient will DC to: [facility] Anticipated DC date: 10/17/19 Family notified: Sons Transport by: Sharin Mons    Per MD patient ready for DC to [facility]. RN, patient, patient's family, and facility notified of DC. Discharge Summary and FL2 sent to facility. RN to call report prior to discharge [phone] Room 3225). DC packet on chart. Ambulance transport requested for patient.   CSW will sign off for now as social work intervention is no longer needed. Please consult [place] again if new needs arise.      Final next level of care: Skilled Nursing Facility Barriers to Discharge: No Barriers Identified   Patient Goals and CMS Choice Patient states their goals for this hospitalization and ongoing recovery are:: Pt's family is agreeable to SNF before she returns to ALF facility. CMS Medicare.gov Compare Post Acute Care list provided to:: Patient Represenative (must comment)([name] Fogelman, son) Choice offered to / list presented to : Adult Children  Discharge Placement   Existing PASRR number confirmed : 10/17/19          Patient chooses bed at: [facility] Patient to be transferred to facility by: [facility] Name of family member notified: Son, Rosanne Ashing and Freida Busman Patient and family notified of of transfer: 10/17/19  Discharge Plan and Services In-house Referral: Clinical Social Work                                   Social Determinants of Health (SDOH) Interventions     Readmission Risk Interventions Readmission Risk Prevention Plan 10/17/2019  Transportation Screening Complete  PCP or Specialist Appt within 3-5 Days Complete  HRI or Home Care Consult Complete  Social Work Consult for Recovery Care  Planning/Counseling Complete  Palliative Care Screening Not Applicable  Medication Review Oceanographer) Complete  Some recent data might be hidden

## 2019-10-20 ENCOUNTER — Telehealth: Payer: Self-pay | Admitting: *Deleted

## 2019-10-20 NOTE — Telephone Encounter (Signed)
Called patient on 10/20/2019 , 12:20 PM in an attempt to reach the patient for a hospital follow up. Spoke with son, Jeneen Rinks.   Admit date: 10/13/19 Discharge: 10/17/19   She does not have any questions or concerns about medications from the hospital admission. The patient's medications were reviewed over the phone, they were counseled to bring in all current medications to the hospital follow up visit.   I advised the patient to call if any questions or concerns arise about the hospital admission or medications    Home health was not started in the hospital. Patient was sent to [facility] for rehabilitation. Per her son, PT and OT have been scheduled at the nursing home, with the goal of returning to [facility] in the assisted living area.  All questions were answered and a follow up appointment was made. The son was asked to call our office and schedule a visit, when the patient is able to return to [facility].  Prior to Admission medications   Medication Sig Start Date End Date Taking? Authorizing Provider  aspirin EC 81 MG EC tablet Take 1 tablet (81 mg total) by mouth daily. 10/18/19   Terrilee Croak, MD  atorvastatin (LIPITOR) 20 MG tablet Take 1 tablet (20 mg total) by mouth daily. 10/18/19   Dahal, Marlowe Aschoff, MD  cephALEXin (KEFLEX) 500 MG capsule Take 1 capsule (500 mg total) by mouth every 8 (eight) hours for 3 days. 10/17/19 10/20/19  Terrilee Croak, MD  Cholecalciferol (VITAMIN D3) 125 MCG (5000 UT) TABS Take 5,000-10,000 Units by mouth See admin instructions. Take two tablets (10,000 units) by mouth on Monday, Wednesday, Friday after supper, take one tablet (5,000 units) on Sunday, Tuesday, Thursday, Saturday after supper    [provider]  CINNAMON PO Take 1,000 mg by mouth daily after breakfast.     [provider]  dicyclomine (BENTYL) 20 MG tablet Take 1 tablet 3 x day if needed for nausea, bloating, cramping or diarrhea 05/16/18   Unk Pinto, MD   feeding supplement, ENSURE ENLIVE, (ENSURE ENLIVE) LIQD Take 237 mLs by mouth 2 (two) times daily between meals. Patient not taking: Reported on 10/13/2019 06/20/18   Debbe Odea, MD  furosemide (LASIX) 20 MG tablet Take 1 tablet Daily  for BP & Fluid Retention / Ankle Swelling. Patient taking differently: Take 20 mg by mouth daily. for BP & Fluid Retention / Ankle Swelling. 04/14/19   Unk Pinto, MD  loperamide (IMODIUM) 2 MG capsule TAKE ONE CAPSULE IN THE P.M. Patient taking differently: Take 2 mg by mouth daily.  09/10/19   Liane Comber, NP  losartan (COZAAR) 50 MG tablet Take 1 tablet Daily for BP Patient taking differently: Take 50 mg by mouth daily. for BP 07/04/19   Unk Pinto, MD  melatonin 3 MG TABS tablet Take 1 tablet (3 mg total) by mouth at bedtime. 10/17/19   Terrilee Croak, MD  metoprolol tartrate (LOPRESSOR) 25 MG tablet TAKE (1/2) TABLET TWICE DAILY. Patient taking differently: Take 12.5 mg by mouth 2 (two) times daily. TAKE (1/2) TABLET TWICE DAILY. 09/10/19   Liane Comber, NP  ondansetron (ZOFRAN ODT) 4 MG disintegrating tablet Dissolve 1 tablet under tongue every 6 to 8 hours if needed for nausea & / or vomitting 04/10/18   Unk Pinto, MD  pantoprazole (PROTONIX) 40 MG tablet Take 1 tablet Daily for Heart burn & Indigestion Patient taking differently: Take 40 mg by mouth daily after breakfast. for Heart burn & Indigestion 02/14/19   Melford Aase,  Chrissie Noa, MD  potassium chloride (KLOR-CON) 10 MEQ tablet Take 1 tablet daily for Potassium Patient taking differently: Take 10 mEq by mouth daily.  07/08/19   Lucky Cowboy, MD  QUEtiapine (SEROQUEL) 25 MG tablet TAKE 1/2 TABLET AT BEDTIME. Patient taking differently: Take 12.5 mg by mouth at bedtime. Take 1/2 tablet at Bedtime 09/29/19   Lucky Cowboy, MD  warfarin (COUMADIN) 2 MG tablet TAKE 1 TABLET EACH DAY EXCEPT 1/2 TABLET ON MONDAYS AND FRIDAYS OR AS DIRECTED BY ANTICOAGULATION CLINIC. 10/01/19   Lars Masson, MD

## 2019-10-22 DIAGNOSIS — Z66 Do not resuscitate: Secondary | ICD-10-CM | POA: Diagnosis not present

## 2019-10-22 DIAGNOSIS — I4891 Unspecified atrial fibrillation: Secondary | ICD-10-CM | POA: Diagnosis not present

## 2019-10-22 DIAGNOSIS — I639 Cerebral infarction, unspecified: Secondary | ICD-10-CM | POA: Diagnosis not present

## 2019-10-22 DIAGNOSIS — R1310 Dysphagia, unspecified: Secondary | ICD-10-CM | POA: Diagnosis not present

## 2019-10-24 DIAGNOSIS — E860 Dehydration: Secondary | ICD-10-CM | POA: Diagnosis not present

## 2019-10-24 DIAGNOSIS — F320 Major depressive disorder, single episode, mild: Secondary | ICD-10-CM | POA: Diagnosis not present

## 2019-10-24 DIAGNOSIS — E782 Mixed hyperlipidemia: Secondary | ICD-10-CM | POA: Diagnosis not present

## 2019-10-24 DIAGNOSIS — I10 Essential (primary) hypertension: Secondary | ICD-10-CM | POA: Diagnosis not present

## 2019-10-27 ENCOUNTER — Other Ambulatory Visit: Payer: Self-pay | Admitting: Cardiology

## 2019-10-27 ENCOUNTER — Other Ambulatory Visit: Payer: Self-pay | Admitting: Internal Medicine

## 2019-10-27 ENCOUNTER — Other Ambulatory Visit: Payer: Self-pay | Admitting: Adult Health

## 2019-10-27 DIAGNOSIS — Z79899 Other long term (current) drug therapy: Secondary | ICD-10-CM

## 2019-10-27 DIAGNOSIS — I4891 Unspecified atrial fibrillation: Secondary | ICD-10-CM

## 2019-10-28 DIAGNOSIS — E860 Dehydration: Secondary | ICD-10-CM | POA: Diagnosis not present

## 2019-10-28 DIAGNOSIS — I10 Essential (primary) hypertension: Secondary | ICD-10-CM | POA: Diagnosis not present

## 2019-10-28 DIAGNOSIS — K5909 Other constipation: Secondary | ICD-10-CM | POA: Diagnosis not present

## 2019-10-28 DIAGNOSIS — E782 Mixed hyperlipidemia: Secondary | ICD-10-CM | POA: Diagnosis not present

## 2019-10-29 DIAGNOSIS — E782 Mixed hyperlipidemia: Secondary | ICD-10-CM | POA: Diagnosis not present

## 2019-10-29 DIAGNOSIS — I10 Essential (primary) hypertension: Secondary | ICD-10-CM | POA: Diagnosis not present

## 2019-10-29 DIAGNOSIS — K5909 Other constipation: Secondary | ICD-10-CM | POA: Diagnosis not present

## 2019-10-29 DIAGNOSIS — K560 Paralytic ileus: Secondary | ICD-10-CM | POA: Diagnosis not present

## 2019-10-30 ENCOUNTER — Ambulatory Visit: Payer: Medicare Other | Admitting: Adult Health

## 2019-10-30 ENCOUNTER — Other Ambulatory Visit: Payer: Self-pay | Admitting: Cardiology

## 2019-11-12 DIAGNOSIS — E782 Mixed hyperlipidemia: Secondary | ICD-10-CM | POA: Diagnosis not present

## 2019-11-12 DIAGNOSIS — E860 Dehydration: Secondary | ICD-10-CM | POA: Diagnosis not present

## 2019-11-12 DIAGNOSIS — I480 Paroxysmal atrial fibrillation: Secondary | ICD-10-CM | POA: Diagnosis not present

## 2019-11-12 DIAGNOSIS — I10 Essential (primary) hypertension: Secondary | ICD-10-CM | POA: Diagnosis not present

## 2019-11-17 DIAGNOSIS — I480 Paroxysmal atrial fibrillation: Secondary | ICD-10-CM | POA: Diagnosis not present

## 2019-11-17 DIAGNOSIS — R0902 Hypoxemia: Secondary | ICD-10-CM | POA: Diagnosis not present

## 2019-11-17 DIAGNOSIS — E782 Mixed hyperlipidemia: Secondary | ICD-10-CM | POA: Diagnosis not present

## 2019-11-17 DIAGNOSIS — I10 Essential (primary) hypertension: Secondary | ICD-10-CM | POA: Diagnosis not present

## 2019-11-18 ENCOUNTER — Inpatient Hospital Stay: Payer: Self-pay | Admitting: Adult Health

## 2019-11-18 DIAGNOSIS — I4891 Unspecified atrial fibrillation: Secondary | ICD-10-CM | POA: Diagnosis not present

## 2019-11-19 ENCOUNTER — Emergency Department (HOSPITAL_COMMUNITY): Payer: Medicare Other

## 2019-11-19 ENCOUNTER — Inpatient Hospital Stay (HOSPITAL_COMMUNITY)
Admission: EM | Admit: 2019-11-19 | Discharge: 2019-11-20 | DRG: 698 | Disposition: A | Discharge status: Skilled Nursing Facility | Payer: Medicare Other | Attending: Internal Medicine | Admitting: Internal Medicine

## 2019-11-19 DIAGNOSIS — E785 Hyperlipidemia, unspecified: Secondary | ICD-10-CM | POA: Diagnosis present

## 2019-11-19 DIAGNOSIS — J189 Pneumonia, unspecified organism: Secondary | ICD-10-CM

## 2019-11-19 DIAGNOSIS — I4821 Permanent atrial fibrillation: Secondary | ICD-10-CM | POA: Diagnosis not present

## 2019-11-19 DIAGNOSIS — F419 Anxiety disorder, unspecified: Secondary | ICD-10-CM | POA: Diagnosis present

## 2019-11-19 DIAGNOSIS — R0602 Shortness of breath: Secondary | ICD-10-CM | POA: Diagnosis not present

## 2019-11-19 DIAGNOSIS — N1831 Chronic kidney disease, stage 3a: Secondary | ICD-10-CM | POA: Diagnosis present

## 2019-11-19 DIAGNOSIS — F0150 Vascular dementia without behavioral disturbance: Secondary | ICD-10-CM | POA: Diagnosis present

## 2019-11-19 DIAGNOSIS — E1165 Type 2 diabetes mellitus with hyperglycemia: Secondary | ICD-10-CM | POA: Diagnosis not present

## 2019-11-19 DIAGNOSIS — Y846 Urinary catheterization as the cause of abnormal reaction of the patient, or of later complication, without mention of misadventure at the time of the procedure: Secondary | ICD-10-CM | POA: Diagnosis present

## 2019-11-19 DIAGNOSIS — N390 Urinary tract infection, site not specified: Secondary | ICD-10-CM | POA: Diagnosis present

## 2019-11-19 DIAGNOSIS — T83518A Infection and inflammatory reaction due to other urinary catheter, initial encounter: Secondary | ICD-10-CM | POA: Diagnosis not present

## 2019-11-19 DIAGNOSIS — A419 Sepsis, unspecified organism: Secondary | ICD-10-CM | POA: Diagnosis not present

## 2019-11-19 DIAGNOSIS — R404 Transient alteration of awareness: Secondary | ICD-10-CM | POA: Diagnosis not present

## 2019-11-19 DIAGNOSIS — K589 Irritable bowel syndrome without diarrhea: Secondary | ICD-10-CM | POA: Diagnosis present

## 2019-11-19 DIAGNOSIS — K219 Gastro-esophageal reflux disease without esophagitis: Secondary | ICD-10-CM | POA: Diagnosis present

## 2019-11-19 DIAGNOSIS — Z79899 Other long term (current) drug therapy: Secondary | ICD-10-CM

## 2019-11-19 DIAGNOSIS — J9601 Acute respiratory failure with hypoxia: Secondary | ICD-10-CM | POA: Diagnosis not present

## 2019-11-19 DIAGNOSIS — Z515 Encounter for palliative care: Secondary | ICD-10-CM | POA: Diagnosis not present

## 2019-11-19 DIAGNOSIS — R6520 Severe sepsis without septic shock: Secondary | ICD-10-CM | POA: Diagnosis not present

## 2019-11-19 DIAGNOSIS — Z823 Family history of stroke: Secondary | ICD-10-CM

## 2019-11-19 DIAGNOSIS — E871 Hypo-osmolality and hyponatremia: Secondary | ICD-10-CM | POA: Diagnosis not present

## 2019-11-19 DIAGNOSIS — Z9049 Acquired absence of other specified parts of digestive tract: Secondary | ICD-10-CM

## 2019-11-19 DIAGNOSIS — B9620 Unspecified Escherichia coli [E. coli] as the cause of diseases classified elsewhere: Secondary | ICD-10-CM | POA: Diagnosis present

## 2019-11-19 DIAGNOSIS — Z833 Family history of diabetes mellitus: Secondary | ICD-10-CM

## 2019-11-19 DIAGNOSIS — I69391 Dysphagia following cerebral infarction: Secondary | ICD-10-CM | POA: Diagnosis not present

## 2019-11-19 DIAGNOSIS — J690 Pneumonitis due to inhalation of food and vomit: Secondary | ICD-10-CM | POA: Diagnosis present

## 2019-11-19 DIAGNOSIS — R1310 Dysphagia, unspecified: Secondary | ICD-10-CM | POA: Diagnosis present

## 2019-11-19 DIAGNOSIS — E1122 Type 2 diabetes mellitus with diabetic chronic kidney disease: Secondary | ICD-10-CM | POA: Diagnosis present

## 2019-11-19 DIAGNOSIS — Z7189 Other specified counseling: Secondary | ICD-10-CM | POA: Diagnosis not present

## 2019-11-19 DIAGNOSIS — I129 Hypertensive chronic kidney disease with stage 1 through stage 4 chronic kidney disease, or unspecified chronic kidney disease: Secondary | ICD-10-CM | POA: Diagnosis present

## 2019-11-19 DIAGNOSIS — K449 Diaphragmatic hernia without obstruction or gangrene: Secondary | ICD-10-CM | POA: Diagnosis present

## 2019-11-19 DIAGNOSIS — Z7982 Long term (current) use of aspirin: Secondary | ICD-10-CM

## 2019-11-19 DIAGNOSIS — Z888 Allergy status to other drugs, medicaments and biological substances status: Secondary | ICD-10-CM

## 2019-11-19 DIAGNOSIS — Z66 Do not resuscitate: Secondary | ICD-10-CM | POA: Diagnosis present

## 2019-11-19 DIAGNOSIS — Z1622 Resistance to vancomycin related antibiotics: Secondary | ICD-10-CM | POA: Diagnosis present

## 2019-11-19 DIAGNOSIS — Z8673 Personal history of transient ischemic attack (TIA), and cerebral infarction without residual deficits: Secondary | ICD-10-CM | POA: Diagnosis not present

## 2019-11-19 DIAGNOSIS — Z8249 Family history of ischemic heart disease and other diseases of the circulatory system: Secondary | ICD-10-CM

## 2019-11-19 DIAGNOSIS — R0902 Hypoxemia: Secondary | ICD-10-CM | POA: Diagnosis not present

## 2019-11-19 DIAGNOSIS — I69320 Aphasia following cerebral infarction: Secondary | ICD-10-CM | POA: Diagnosis not present

## 2019-11-19 DIAGNOSIS — Z20822 Contact with and (suspected) exposure to covid-19: Secondary | ICD-10-CM | POA: Diagnosis not present

## 2019-11-19 DIAGNOSIS — I10 Essential (primary) hypertension: Secondary | ICD-10-CM | POA: Diagnosis present

## 2019-11-19 DIAGNOSIS — I69351 Hemiplegia and hemiparesis following cerebral infarction affecting right dominant side: Secondary | ICD-10-CM

## 2019-11-19 DIAGNOSIS — E119 Type 2 diabetes mellitus without complications: Secondary | ICD-10-CM

## 2019-11-19 DIAGNOSIS — Z86718 Personal history of other venous thrombosis and embolism: Secondary | ICD-10-CM

## 2019-11-19 DIAGNOSIS — Z7901 Long term (current) use of anticoagulants: Secondary | ICD-10-CM

## 2019-11-19 DIAGNOSIS — R0689 Other abnormalities of breathing: Secondary | ICD-10-CM | POA: Diagnosis not present

## 2019-11-19 LAB — URINALYSIS, ROUTINE W REFLEX MICROSCOPIC
Bilirubin Urine: NEGATIVE
Glucose, UA: NEGATIVE mg/dL
Ketones, ur: NEGATIVE mg/dL
Nitrite: POSITIVE — AB
Protein, ur: 100 mg/dL — AB
Specific Gravity, Urine: 1.019 (ref 1.005–1.030)
WBC, UA: >50 WBC/hpf — ABNORMAL HIGH (ref 0–5)
pH: 5 (ref 5.0–8.0)

## 2019-11-19 LAB — COMPREHENSIVE METABOLIC PANEL
ALT: 17 U/L (ref 0–44)
AST: 21 U/L (ref 15–41)
Albumin: 2.3 g/dL — ABNORMAL LOW (ref 3.5–5.0)
Alkaline Phosphatase: 86 U/L (ref 38–126)
Anion gap: 13 (ref 5–15)
BUN: 17 mg/dL (ref 8–23)
CO2: 20 mmol/L — ABNORMAL LOW (ref 22–32)
Calcium: 8.8 mg/dL — ABNORMAL LOW (ref 8.9–10.3)
Chloride: 100 mmol/L (ref 98–111)
Creatinine, Ser: 0.76 mg/dL (ref 0.44–1.00)
GFR calc Af Amer: >60 mL/min (ref >60)
GFR calc non Af Amer: >60 mL/min (ref >60)
Glucose, Bld: 159 mg/dL — ABNORMAL HIGH (ref 70–99)
Potassium: 4.5 mmol/L (ref 3.5–5.1)
Sodium: 133 mmol/L — ABNORMAL LOW (ref 135–145)
Total Bilirubin: 1.2 mg/dL (ref 0.3–1.2)
Total Protein: 6 g/dL — ABNORMAL LOW (ref 6.5–8.1)

## 2019-11-19 LAB — CBC WITH DIFFERENTIAL/PLATELET
Abs Immature Granulocytes: 0.04 10*3/uL (ref 0.00–0.07)
Basophils Absolute: 0 10*3/uL (ref 0.0–0.1)
Basophils Relative: 0 %
Eosinophils Absolute: 0 10*3/uL (ref 0.0–0.5)
Eosinophils Relative: 0 %
HCT: 48.9 % — ABNORMAL HIGH (ref 36.0–46.0)
Hemoglobin: 15.5 g/dL — ABNORMAL HIGH (ref 12.0–15.0)
Immature Granulocytes: 1 %
Lymphocytes Relative: 15 %
Lymphs Abs: 1.2 10*3/uL (ref 0.7–4.0)
MCH: 30.1 pg (ref 26.0–34.0)
MCHC: 31.7 g/dL (ref 30.0–36.0)
MCV: 95 fL (ref 80.0–100.0)
Monocytes Absolute: 0.5 10*3/uL (ref 0.1–1.0)
Monocytes Relative: 7 %
Neutro Abs: 5.9 10*3/uL (ref 1.7–7.7)
Neutrophils Relative %: 77 %
Platelets: 193 10*3/uL (ref 150–400)
RBC: 5.15 MIL/uL — ABNORMAL HIGH (ref 3.87–5.11)
RDW: 14 % (ref 11.5–15.5)
WBC: 7.7 10*3/uL (ref 4.0–10.5)
nRBC: 0 % (ref 0.0–0.2)

## 2019-11-19 LAB — MAGNESIUM: Magnesium: 1.7 mg/dL (ref 1.7–2.4)

## 2019-11-19 LAB — LACTIC ACID, PLASMA
Lactic Acid, Venous: 3.9 mmol/L (ref 0.5–1.9)
Lactic Acid, Venous: 2.6 mmol/L (ref 0.5–1.9)

## 2019-11-19 LAB — PHOSPHORUS: Phosphorus: 3 mg/dL (ref 2.5–4.6)

## 2019-11-19 LAB — SARS CORONAVIRUS 2 BY RT PCR (HOSPITAL ORDER, PERFORMED IN ~~LOC~~ HOSPITAL LAB): SARS Coronavirus 2: NEGATIVE

## 2019-11-19 LAB — PROTIME-INR
INR: 1.5 — ABNORMAL HIGH (ref 0.8–1.2)
Prothrombin Time: 17.6 seconds — ABNORMAL HIGH (ref 11.4–15.2)

## 2019-11-19 LAB — APTT: aPTT: 30 seconds (ref 24–36)

## 2019-11-19 MED ORDER — VANCOMYCIN HCL IN DEXTROSE 1-5 GM/200ML-% IV SOLN
1000.0000 mg | Freq: Once | INTRAVENOUS | Status: AC
  Administered 2019-11-19: 1000 mg via INTRAVENOUS
  Filled 2019-11-19: qty 200

## 2019-11-19 MED ORDER — VANCOMYCIN HCL 500 MG/100ML IV SOLN: 500.0000 mg | Freq: Two times a day (BID) | INTRAVENOUS | Status: DC

## 2019-11-19 MED ORDER — ENOXAPARIN SODIUM 60 MG/0.6ML ~~LOC~~ SOLN
53.0000 mg | Freq: Two times a day (BID) | SUBCUTANEOUS | Status: DC
  Administered 2019-11-19 – 2019-11-20 (×2): 53 mg via SUBCUTANEOUS
  Filled 2019-11-19 (×3): qty 0.53

## 2019-11-19 MED ORDER — ENOXAPARIN SODIUM 40 MG/0.4ML ~~LOC~~ SOLN: 40.0000 mg | SUBCUTANEOUS | Status: DC

## 2019-11-19 MED ORDER — SODIUM CHLORIDE 0.9 % IV SOLN
2.0000 g | Freq: Once | INTRAVENOUS | Status: AC
  Administered 2019-11-19: 2 g via INTRAVENOUS
  Filled 2019-11-19: qty 2

## 2019-11-19 MED ORDER — ACETAMINOPHEN 325 MG PO TABS: 650.0000 mg | ORAL_TABLET | Freq: Once | ORAL | Status: DC

## 2019-11-19 MED ORDER — ACETAMINOPHEN 650 MG RE SUPP: 650.0000 mg | Freq: Four times a day (QID) | RECTAL | Status: DC | PRN

## 2019-11-19 MED ORDER — HYDRALAZINE HCL 20 MG/ML IJ SOLN: 10.0000 mg | Freq: Four times a day (QID) | INTRAMUSCULAR | Status: DC | PRN

## 2019-11-19 MED ORDER — SODIUM CHLORIDE 0.9 % IV SOLN: INTRAVENOUS | Status: DC

## 2019-11-19 MED ORDER — ACETAMINOPHEN 650 MG RE SUPP
650.0000 mg | Freq: Once | RECTAL | Status: AC
  Administered 2019-11-19: 650 mg via RECTAL
  Filled 2019-11-19: qty 1

## 2019-11-19 MED ORDER — SODIUM CHLORIDE 0.9 % IV BOLUS
1000.0000 mL | Freq: Once | INTRAVENOUS | Status: AC
  Administered 2019-11-19: 1000 mL via INTRAVENOUS

## 2019-11-19 MED ORDER — SODIUM CHLORIDE 0.9 % IV SOLN
3.0000 g | Freq: Four times a day (QID) | INTRAVENOUS | Status: DC
  Administered 2019-11-19 – 2019-11-20 (×2): 3 g via INTRAVENOUS
  Filled 2019-11-19 (×4): qty 8

## 2019-11-19 MED ORDER — ALBUTEROL SULFATE (2.5 MG/3ML) 0.083% IN NEBU: 2.5000 mg | INHALATION_SOLUTION | RESPIRATORY_TRACT | Status: DC | PRN

## 2019-11-19 MED ORDER — METRONIDAZOLE IN NACL 5-0.79 MG/ML-% IV SOLN
500.0000 mg | Freq: Once | INTRAVENOUS | Status: AC
  Administered 2019-11-19: 500 mg via INTRAVENOUS
  Filled 2019-11-19: qty 100

## 2019-11-19 MED ORDER — ONDANSETRON HCL 4 MG PO TABS: 4.0000 mg | ORAL_TABLET | Freq: Four times a day (QID) | ORAL | Status: DC | PRN

## 2019-11-19 MED ORDER — ONDANSETRON HCL 4 MG/2ML IJ SOLN: 4.0000 mg | Freq: Four times a day (QID) | INTRAMUSCULAR | Status: DC | PRN

## 2019-11-19 MED ORDER — ACETAMINOPHEN 325 MG PO TABS: 650.0000 mg | ORAL_TABLET | Freq: Four times a day (QID) | ORAL | Status: DC | PRN

## 2019-11-19 MED ORDER — SODIUM CHLORIDE 0.9 % IV SOLN: 2.0000 g | Freq: Two times a day (BID) | INTRAVENOUS | Status: DC

## 2019-11-19 NOTE — Progress Notes (Signed)
ANTICOAGULATION CONSULT NOTE - Initial Consult  Pharmacy Consult for warfarin > lovenox Indication: atrial fibrillation  Allergies  Allergen Reactions  . Ace Inhibitors Cough  . Fosamax [Alendronate Sodium] Other (See Comments)    Heart burn  . Norvasc [Amlodipine Besylate] Other (See Comments)    edema  . Zocor [Simvastatin] Other (See Comments)    Elevated CPK    Vital Signs: Temp: 101.5 F (38.6 C) (06/23 1044) Temp Source: Rectal (06/23 1044) BP: 115/70 (06/23 1500) Pulse Rate: 102 (06/23 1330)  Labs: Recent Labs    11/19/19 1110 11/19/19 1242  HGB 15.5*  --   HCT 48.9*  --   PLT 193  --   APTT  --  30  LABPROT  --  17.6*  INR  --  1.5*  CREATININE 0.76  --     CrCl cannot be calculated (Unknown ideal weight.).   Medical History: Past Medical History:  Diagnosis Date  . Anxiety   . Aphasia as late effect of cerebrovascular accident   . Atrial fibrillation (HCC)   . CKD (chronic kidney disease), stage II   . CVA (cerebral infarction)   . Diabetes mellitus type II   . DVT, lower extremity, distal, acute, right (HCC) 03/25/2018   Right peroneal vein 03/22/2018  . GERD (gastroesophageal reflux disease)   . Hiatal hernia   . HTN (hypertension)   . Hyperlipidemia   . IBS (irritable bowel syndrome)   . Stroke (HCC)   . Vitamin D deficiency    Assessment: 48 yom with history of atrial fibrillation on warfarin PTA. PTA dose per 10/01/19 coumadin clinic note is 1 mg on Mondays and Fridays, 2 mg all other days. INR on admission is sub-therapeutic at 1.5. MD would like therapeutic enoxaparin for now d/t patient's aspiriation risk and NPO status. Last weight 10/14/2019 was 53 kg. H&H 15.5/48.9, plts wnl at 193. Creatinine stable at baseline.   Goal of Therapy:  INR 2-3 Monitor platelets by anticoagulation protocol: Yes   Plan:  Hold warfarin until patient cleared to take POs Lovenox 53 mg (1mg /kg) Love every 12 hours  Monitor weight; may adjust to 1.5 mg/kg  every 24 hours once weight finalized Monitor bleeding, CBC   Thank you,   , PharmD PGY-1 Pharmacy Resident   Please check amion for clinical pharmacist contact number 11/19/2019,3:55 PM

## 2019-11-19 NOTE — ED Notes (Signed)
May call son's, Rosanne Ashing or Freida Busman, with updates. There numbers are listed in patient contact's.

## 2019-11-19 NOTE — ED Provider Notes (Signed)
[facility] EMERGENCY DEPARTMENT Provider Note   CSN: [id number] Arrival date & time: 11/19/19  1040     History Chief Complaint  Patient presents with  . Aspiration    NATALEE TOMKIEWICZ is a 86 y.o. female with a past medical history of A. fib, CKD, diabetes, hypertension, prior stroke with residual right-sided weakness and nonverbal at baseline, currently anticoagulated on Coumadin presenting to the ED from [facility] for possible aspiration.  Patient does not wear supplemental oxygen at baseline.  Since 11/15/2019, patient found to have oxygen saturations in the 80s.  She was placed on 2 L via nasal cannula.  This seemed to worsen today, oxygen saturations noted in the 70s.  Appears to have a rhonchorous cough.  She is denying any chest pain, abdominal pain remainder of history is limited as she is nonverbal at baseline. No falls reported.  HPI     Past Medical History:  Diagnosis Date  . Anxiety   . Aphasia as late effect of cerebrovascular accident   . Atrial fibrillation (HCC)   . CKD (chronic kidney disease), stage II   . CVA (cerebral infarction)   . Diabetes mellitus type II   . DVT, lower extremity, distal, acute, right (HCC) 03/25/2018   Right peroneal vein 03/22/2018  . GERD (gastroesophageal reflux disease)   . Hiatal hernia   . HTN (hypertension)   . Hyperlipidemia   . IBS (irritable bowel syndrome)   . Stroke (HCC)   . Vitamin D deficiency     Patient Active Problem List   Diagnosis Date Noted  . Acute CVA (cerebrovascular accident) (HCC) 10/13/2019  . Vascular dementia without behavioral disturbance (HCC) 12/31/2018  . Protein-calorie malnutrition, severe 06/20/2018  . Encounter for general adult medical examination with abnormal findings 12/21/2017  . Hiatal hernia 12/19/2017  . CKD stage 2 due to type 2 diabetes mellitus (HCC) 08/02/2017  . Diet-controlled type 2 diabetes mellitus (HCC) 12/25/2016  . Atherosclerosis of aorta  (HCC) 12/03/2015  . GERD 09/25/2014  . Diastolic CHF (HCC) 08/04/2014  . Hyperlipidemia associated with type 2 diabetes mellitus (HCC)   . Vitamin D deficiency   . Anxiety state 03/15/2010  . Essential hypertension 03/15/2010  . Atrial fibrillation (HCC) 03/15/2010  . APHASIA DUE TO CEREBROVASCULAR DISEASE 03/15/2010  . History of CVA (cerebrovascular accident) 03/07/2010    Past Surgical History:  Procedure Laterality Date  . CATARACT EXTRACTION, BILATERAL    . CHOLECYSTECTOMY    . COLONOSCOPY  2005  . ESOPHAGOGASTRODUODENOSCOPY (EGD) WITH PROPOFOL N/A 11/28/2017   Procedure: ESOPHAGOGASTRODUODENOSCOPY (EGD) WITH PROPOFOL;  Surgeon: Graylin Shiver, MD;  Location: [facility] ENDOSCOPY;  Service: Endoscopy;  Laterality: N/A;  . TONSILLECTOMY    . UPPER GASTROINTESTINAL ENDOSCOPY  2005     OB History   No obstetric history on file.     Family History  Problem Relation Age of Onset  . Diabetes Mother   . Stroke Mother   . Hypertension Brother   . Heart disease Brother   . Arthritis Sister        Rhematoid  . Arthritis Sister        Rhematoid    Social History   Tobacco Use  . Smoking status: Never Smoker  . Smokeless tobacco: Never Used  Vaping Use  . Vaping Use: Never used  Substance Use Topics  . Alcohol use: No  . Drug use: No    Home Medications Prior to Admission medications   Medication Sig Start  Date End Date Taking? Authorizing Provider  aspirin EC 81 MG EC tablet Take 1 tablet (81 mg total) by mouth daily. 10/18/19  Yes Dahal, Melina SchoolsBinaya, MD  atorvastatin (LIPITOR) 20 MG tablet Take 1 tablet (20 mg total) by mouth daily. 10/18/19  Yes Dahal, Melina SchoolsBinaya, MD  Cholecalciferol (VITAMIN D3) 125 MCG (5000 UT) TABS Take 5,000-10,000 Units by mouth See admin instructions. Take two tablets (10,000 units) by mouth on Monday, Wednesday, Friday after supper, take one tablet (5,000 units) on Sunday, Tuesday, Thursday, Saturday after supper   Yes [provider]  CINNAMON PO  Take 1,000 mg by mouth daily after breakfast.    Yes [provider]  dicyclomine (BENTYL) 20 MG tablet Take 1 tablet 3 x day if needed for nausea, bloating, cramping or diarrhea 05/16/18  Yes Lucky CowboyMcKeown, William, MD  furosemide (LASIX) 20 MG tablet Take 1 tablet Daily  for BP & Fluid Retention / Ankle Swelling. Patient taking differently: Take 20 mg by mouth daily. for BP & Fluid Retention / Ankle Swelling. 04/14/19  Yes Lucky CowboyMcKeown, William, MD  loperamide (IMODIUM) 2 MG capsule Take 1 capsule Daily to Prevent Diarrhea Patient taking differently: Take 2 mg by mouth at bedtime. Take 1 capsule Daily to Prevent Diarrhea 10/27/19  Yes Lucky CowboyMcKeown, William, MD  losartan (COZAAR) 50 MG tablet Take 1 tablet Daily for BP Patient taking differently: Take 50 mg by mouth daily. for BP 07/04/19  Yes Lucky CowboyMcKeown, William, MD  melatonin 3 MG TABS tablet Take 1 tablet (3 mg total) by mouth at bedtime. 10/17/19  Yes Dahal, Melina SchoolsBinaya, MD  metoprolol tartrate (LOPRESSOR) 25 MG tablet Take 1/2 tablet 2 x /day for Afib Patient taking differently: Take 12.5 mg by mouth 2 (two) times daily. Take 1/2 tablet 2 x /day for Afib 10/27/19  Yes Lucky CowboyMcKeown, William, MD  nystatin (NYSTATIN) powder Apply 1 application topically 2 (two) times daily.   Yes [provider]  ondansetron (ZOFRAN ODT) 4 MG disintegrating tablet Dissolve 1 tablet under tongue every 6 to 8 hours if needed for nausea & / or vomitting 04/10/18  Yes Lucky CowboyMcKeown, William, MD  pantoprazole (PROTONIX) 40 MG tablet Take 1 tablet Daily for Heart burn & Indigestion Patient taking differently: Take 40 mg by mouth daily after breakfast. for Heart burn & Indigestion 02/14/19  Yes Lucky CowboyMcKeown, William, MD  potassium chloride (KLOR-CON) 10 MEQ tablet Take 1 tablet daily for Potassium Patient taking differently: Take 10 mEq by mouth daily.  07/08/19  Yes Lucky CowboyMcKeown, William, MD  QUEtiapine (SEROQUEL) 25 MG tablet TAKE 1/2 TABLET AT BEDTIME. Patient taking differently: Take 12.5 mg by  mouth at bedtime. Take 1/2 tablet at Bedtime 09/29/19  Yes Lucky CowboyMcKeown, William, MD  senna (SENOKOT) 8.6 MG TABS tablet Take 2 tablets by mouth at bedtime.   Yes [provider]  warfarin (COUMADIN) 2 MG tablet TAKE 1 TABLET EACH DAY EXCEPT 1/2 TABLET ON MONDAYS AND FRIDAYS OR AS DIRECTED BY ANTICOAGULATION CLINIC. Patient taking differently: Take 1 mg by mouth every evening. TAKE 1 TABLET EACH DAY EXCEPT 1/2 TABLET ON MONDAYS AND FRIDAYS OR AS DIRECTED BY ANTICOAGULATION CLINIC. 10/01/19  Yes Lars MassonNelson, Katarina H, MD  feeding supplement, ENSURE ENLIVE, (ENSURE ENLIVE) LIQD Take 237 mLs by mouth 2 (two) times daily between meals. Patient not taking: Reported on 10/13/2019 06/20/18   Calvert Cantorizwan, Saima, MD    Allergies    Ace inhibitors, Fosamax [alendronate sodium], Norvasc [amlodipine besylate], and Zocor [simvastatin]  Review of Systems   Review of Systems  Unable  to perform ROS: Patient nonverbal  Respiratory: Positive for cough.   Cardiovascular: Negative for chest pain.    Physical Exam Updated Vital Signs BP 140/75   Pulse (!) 102   Temp (!) 101.5 F (38.6 C) (Rectal)   Resp (!) 25   SpO2 96%   Physical Exam Vitals and nursing note reviewed.  Constitutional:      General: She is not in acute distress.    Appearance: She is well-developed.     Comments: On 4 L of oxygen by nasal cannula.  HENT:     Head: Normocephalic and atraumatic.     Nose: Nose normal.  Eyes:     General: No scleral icterus.       Right eye: No discharge.        Left eye: No discharge.     Conjunctiva/sclera: Conjunctivae normal.  Cardiovascular:     Rate and Rhythm: Regular rhythm. Tachycardia present.     Heart sounds: Normal heart sounds. No murmur heard.  No friction rub. No gallop.   Pulmonary:     Effort: Pulmonary effort is normal. Tachypnea present. No respiratory distress.     Breath sounds: Rhonchi (Throughout bilateral lung fields) present.  Abdominal:     General: Bowel sounds are normal.  There is no distension.     Palpations: Abdomen is soft.     Tenderness: There is no abdominal tenderness. There is no guarding.  Genitourinary:    Comments: Foley in place. Musculoskeletal:        General: Normal range of motion.     Cervical back: Normal range of motion and neck supple.  Skin:    General: Skin is warm and dry.     Findings: No rash.     Comments: 2+ DP pulses noted bilaterally.  Neurological:     Mental Status: She is alert. Mental status is at baseline.     Motor: Weakness present. No abnormal muscle tone.     Coordination: Coordination normal.     Comments: Decreased movement of right upper and lower extremities.     ED Results / Procedures / Treatments   Labs (all labs ordered are listed, but only abnormal results are displayed) Labs Reviewed  LACTIC ACID, PLASMA - Abnormal; Notable for the following components:      Result Value   Lactic Acid, Venous 3.9 (*)    All other components within normal limits  COMPREHENSIVE METABOLIC PANEL - Abnormal; Notable for the following components:   Sodium 133 (*)    CO2 20 (*)    Glucose, Bld 159 (*)    Calcium 8.8 (*)    Total Protein 6.0 (*)    Albumin 2.3 (*)    All other components within normal limits  CBC WITH DIFFERENTIAL/PLATELET - Abnormal; Notable for the following components:   RBC 5.15 (*)    Hemoglobin 15.5 (*)    HCT 48.9 (*)    All other components within normal limits  URINALYSIS, ROUTINE W REFLEX MICROSCOPIC - Abnormal; Notable for the following components:   Color, Urine AMBER (*)    APPearance CLOUDY (*)    Hgb urine dipstick SMALL (*)    Protein, ur 100 (*)    Nitrite POSITIVE (*)    Leukocytes,Ua LARGE (*)    WBC, UA >50 (*)    Bacteria, UA MANY (*)    Non Squamous Epithelial 0-5 (*)    All other components within normal limits  CULTURE, BLOOD (ROUTINE X 2)  CULTURE, BLOOD (ROUTINE X 2)  URINE CULTURE  SARS CORONAVIRUS 2 BY RT PCR (HOSPITAL ORDER, PERFORMED IN [facility]  LAB)  MRSA PCR SCREENING  LACTIC ACID, PLASMA  APTT  PROTIME-INR  MAGNESIUM    EKG EKG Interpretation  Date/Time:  Wednesday November 19 2019 10:42:30 EDT Ventricular Rate:  107 PR Interval:    QRS Duration: 82 QT Interval:  323 QTC Calculation: 431 R Axis:   98 Text Interpretation: Atrial fibrillation Right axis deviation Confirmed by Margarita Grizzle [phone]) on 11/19/2019 11:24:34 AM   Radiology DG Chest Port 1 View  Result Date: 11/19/2019 CLINICAL DATA:  Cough and rhonchi in the lower lobes. Low O2 saturation. Possible aspiration. EXAM: PORTABLE CHEST 1 VIEW COMPARISON:  Chest x-ray dated 07/18/2019 and chest CT dated 03/22/2018 FINDINGS: There are small patchy infiltrates in the right midzone and at the right base which could represent aspiration pneumonitis. The patient has a huge chronic hiatal hernia with secondary slight compressive atelectasis at the left lung base. Heart size and pulmonary vascularity are normal considering the AP portable technique. Aortic atherosclerosis.  No acute bone abnormality. IMPRESSION: Possible small patchy aspiration pneumonitis in the right midzone and at the right base. Aortic Atherosclerosis (ICD10-I70.0). Huge chronic hiatal hernia. Electronically Signed   By: Francene Boyers M.D.   On: 11/19/2019 11:28    Procedures .Critical Care Performed by: Dietrich Pates, PA-C Authorized by: Dietrich Pates, PA-C   Critical care provider statement:    Critical care time (minutes):  45   Critical care was necessary to treat or prevent imminent or life-threatening deterioration of the following conditions:  Cardiac failure, circulatory failure, CNS failure or compromise, sepsis and respiratory failure   Critical care was time spent personally by me on the following activities:  Development of treatment plan with patient or surrogate, discussions with consultants, evaluation of patient's response to treatment, examination of patient, obtaining history from patient  or surrogate, ordering and performing treatments and interventions, ordering and review of laboratory studies, ordering and review of radiographic studies, pulse oximetry and re-evaluation of patient's condition   I assumed direction of critical care for this patient from another provider in my specialty: no     (including critical care time)  Medications Ordered in ED Medications  vancomycin (VANCOCIN) IVPB 1000 mg/200 mL premix (1,000 mg Intravenous New Bag/Given 11/19/19 1135)  ceFEPIme (MAXIPIME) 2 g in sodium chloride 0.9 % 100 mL IVPB (2 g Intravenous New Bag/Given 11/19/19 1113)  metroNIDAZOLE (FLAGYL) IVPB 500 mg (0 mg Intravenous Stopped 11/19/19 1221)  sodium chloride 0.9 % bolus 1,000 mL (1,000 mLs Intravenous New Bag/Given 11/19/19 1115)  acetaminophen (TYLENOL) suppository 650 mg (650 mg Rectal Given 11/19/19 1135)    ED Course  I have reviewed the triage vital signs and the nursing notes.  Pertinent labs & imaging results that were available during my care of the patient were reviewed by me and considered in my medical decision making (see chart for details).  Clinical Course as of Nov 19 1219  Wed Nov 19, 2019  1102 Temp(!): 101.5 F (38.6 C) [HK]  1102 Code sepsis initiated for likely aspiration pneumonia.   [HK]    Clinical Course User Index [HK] Dietrich Pates, PA-C   MDM Rules/Calculators/A&P                          86 year old female with a recent stroke last month resulting in right-sided deficits presenting to the ED  with possible aspiration pneumonia.  4 days ago staff at nursing facility noticed that she was hypoxic to the 80s.  They placed her on 2 L of oxygen.  Symptoms got worse today.  She has been on a thick liquid diet since her stroke last month.  Son explains that 1 week ago patient had an episode of acute urinary retention and constipation.  This was when the Foley was placed by the provider at her facility.  He does not know when it will be removed or when  she will follow up with a specialist.  She was also given enema.  Patient with rhonchorous breath sounds bilaterally.  She is nonverbal at baseline.  She is now on 4 L of oxygen by nasal cannula.  She is in A. fib with heart rates in the 110s.  Febrile to 101.5 rectally.  Lab work ordered and interpreted; significant for lactic of 3.9, she was given IV fluids.  CBC, CMP unremarkable.  Urinalysis with nitrites, leukocytes, many bacteria which could be due to her chronic indwelling catheter.  Chest x-ray shows possible aspiration pneumonitis.  Patient started on cefepime, vancomycin and Flagyl to cover for her infections.  We will admit to medicine service.  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  All imaging, if done today, including plain films, CT scans, and ultrasounds, independently reviewed by me, and interpretations confirmed via formal radiology reads.   Portions of this note were generated with Scientist, clinical (histocompatibility and immunogenetics). Dictation errors may occur despite best attempts at proofreading.  Final Clinical Impression(s) / ED Diagnoses Final diagnoses:  Sepsis with acute hypoxic respiratory failure, due to unspecified organism, unspecified whether septic shock present (HCC)  HCAP (healthcare-associated pneumonia)  Lower urinary tract infectious disease    Rx / DC Orders ED Discharge Orders    None       Dietrich Pates, PA-C 11/19/19 1222    Margarita Grizzle, MD 11/20/19 1337

## 2019-11-19 NOTE — H&P (Addendum)
History and Physical    Lisa Hopkins KZS:[id number] DOB: Jan 30, 1925 DOA: 11/19/2019  PCP: Lucky Cowboy, MD  Patient coming from: [facility] I have personally briefly reviewed patient's old medical records in [facility]  Chief Complaint: Aspiration pneumonia  HPI: Lisa Hopkins is a 86 y.o. female with medical history significant of A. 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  Patient is nonverbal at baseline however nods yes or no.  Patient's son at bedside is the historian.  He tells me that he got call this morning from SNF and was told that his mom aspirated & has wet cough along with fever & her oxygen saturation dropped in the 80s and requiring 2 L of oxygen via nasal cannula.  Upon arrival to EMS patient's oxygen saturation was noted to be in 70s and was placed on 4 L of oxygen via nasal cannula.  She does not uses oxygen at baseline.  Patient has Foley catheter placed 10 days ago.  Son reports that patient had urinary retention therefore Foley catheter was placed.  She also had developed constipation for which she was given enema.  Patient admitted on 10/13/2019 with ischemic stroke.  She failed swallow evaluation therefore speech therapy was consulted.  She was placed on nectar thickened diet.  No history of headache, chest pain, shortness of breath, head trauma, seizure, loss of consciousness, abdominal pain, diarrhea, decreased appetite.  ED Course: Upon arrival to ED: Patient had a fever of 101.5, tachycardic, tachypneic, requiring 5 L of oxygen via nasal cannula.  Lactic acid: 3.9, sodium: 133, no leukocytosis, COVID-19 negative, UA positive for nitrites and leukocytes.  Blood culture, urine culture, INR: Pending.  Chest x-ray concerning for aspiration pneumonitis.  Patient received  Tylenol, cefepime, Vanco and Flagyl in ED.  [facility] hospitalist consulted for admission for sepsis secondary to aspiration pneumonia.  Review of Systems: As per HPI otherwise negative.    Past Medical History:  Diagnosis Date  . Anxiety   . Aphasia as late effect of cerebrovascular accident   . Atrial fibrillation (HCC)   . CKD (chronic kidney disease), stage II   . CVA (cerebral infarction)   . Diabetes mellitus type II   . DVT, lower extremity, distal, acute, right (HCC) 03/25/2018   Right peroneal vein 03/22/2018  . GERD (gastroesophageal reflux disease)   . Hiatal hernia   . HTN (hypertension)   . Hyperlipidemia   . IBS (irritable bowel syndrome)   . Stroke (HCC)   . Vitamin D deficiency     Past Surgical History:  Procedure Laterality Date  . CATARACT EXTRACTION, BILATERAL    . CHOLECYSTECTOMY    . COLONOSCOPY  2005  . ESOPHAGOGASTRODUODENOSCOPY (EGD) WITH PROPOFOL N/A 11/28/2017   Procedure: ESOPHAGOGASTRODUODENOSCOPY (EGD) WITH PROPOFOL;  Surgeon: Graylin Shiver, MD;  Location: [facility] ENDOSCOPY;  Service: Endoscopy;  Laterality: N/A;  . TONSILLECTOMY    . UPPER GASTROINTESTINAL ENDOSCOPY  2005     reports that she has never smoked. She has never used smokeless tobacco. She reports that she does not drink alcohol and does not use drugs.  Allergies  Allergen Reactions  . Ace Inhibitors Cough  . Fosamax [Alendronate Sodium] Other (See Comments)    Heart burn  . Norvasc [Amlodipine Besylate] Other (See Comments)    edema  . Zocor [Simvastatin] Other (See Comments)    Elevated CPK  Family History  Problem Relation Age of Onset  . Diabetes Mother   . Stroke Mother   . Hypertension Brother   . Heart disease Brother   . Arthritis Sister        Rhematoid  . Arthritis Sister        Rhematoid    Prior to Admission medications   Medication Sig Start Date End Date Taking? Authorizing Provider  aspirin EC 81 MG EC tablet Take 1 tablet (81 mg total) by mouth daily.  10/18/19  Yes Dahal, Melina Schools, MD  atorvastatin (LIPITOR) 20 MG tablet Take 1 tablet (20 mg total) by mouth daily. 10/18/19  Yes Dahal, Melina Schools, MD  Cholecalciferol (VITAMIN D3) 125 MCG (5000 UT) TABS Take 5,000-10,000 Units by mouth See admin instructions. Take two tablets (10,000 units) by mouth on Monday, Wednesday, Friday after supper, take one tablet (5,000 units) on Sunday, Tuesday, Thursday, Saturday after supper   Yes [provider]  CINNAMON PO Take 1,000 mg by mouth daily after breakfast.    Yes [provider]  dicyclomine (BENTYL) 20 MG tablet Take 1 tablet 3 x day if needed for nausea, bloating, cramping or diarrhea 05/16/18  Yes Lucky Cowboy, MD  furosemide (LASIX) 20 MG tablet Take 1 tablet Daily  for BP & Fluid Retention / Ankle Swelling. Patient taking differently: Take 20 mg by mouth daily. for BP & Fluid Retention / Ankle Swelling. 04/14/19  Yes Lucky Cowboy, MD  loperamide (IMODIUM) 2 MG capsule Take 1 capsule Daily to Prevent Diarrhea Patient taking differently: Take 2 mg by mouth at bedtime. Take 1 capsule Daily to Prevent Diarrhea 10/27/19  Yes Lucky Cowboy, MD  losartan (COZAAR) 50 MG tablet Take 1 tablet Daily for BP Patient taking differently: Take 50 mg by mouth daily. for BP 07/04/19  Yes Lucky Cowboy, MD  melatonin 3 MG TABS tablet Take 1 tablet (3 mg total) by mouth at bedtime. 10/17/19  Yes Dahal, Melina Schools, MD  metoprolol tartrate (LOPRESSOR) 25 MG tablet Take 1/2 tablet 2 x /day for Afib Patient taking differently: Take 12.5 mg by mouth 2 (two) times daily. Take 1/2 tablet 2 x /day for Afib 10/27/19  Yes Lucky Cowboy, MD  nystatin (NYSTATIN) powder Apply 1 application topically 2 (two) times daily.   Yes [provider]  ondansetron (ZOFRAN ODT) 4 MG disintegrating tablet Dissolve 1 tablet under tongue every 6 to 8 hours if needed for nausea & / or vomitting 04/10/18  Yes Lucky Cowboy, MD  pantoprazole (PROTONIX) 40 MG tablet  Take 1 tablet Daily for Heart burn & Indigestion Patient taking differently: Take 40 mg by mouth daily after breakfast. for Heart burn & Indigestion 02/14/19  Yes Lucky Cowboy, MD  potassium chloride (KLOR-CON) 10 MEQ tablet Take 1 tablet daily for Potassium Patient taking differently: Take 10 mEq by mouth daily.  07/08/19  Yes Lucky Cowboy, MD  QUEtiapine (SEROQUEL) 25 MG tablet TAKE 1/2 TABLET AT BEDTIME. Patient taking differently: Take 12.5 mg by mouth at bedtime. Take 1/2 tablet at Bedtime 09/29/19  Yes Lucky Cowboy, MD  senna (SENOKOT) 8.6 MG TABS tablet Take 2 tablets by mouth at bedtime.   Yes [provider]  warfarin (COUMADIN) 2 MG tablet TAKE 1 TABLET EACH DAY EXCEPT 1/2 TABLET ON MONDAYS AND FRIDAYS OR AS DIRECTED BY ANTICOAGULATION CLINIC. Patient taking differently: Take 1 mg by mouth every evening. TAKE 1 TABLET EACH DAY EXCEPT 1/2 TABLET ON MONDAYS AND FRIDAYS OR AS DIRECTED BY ANTICOAGULATION CLINIC. 10/01/19  Yes Dorothy Spark, MD  feeding supplement, ENSURE ENLIVE, (ENSURE ENLIVE) LIQD Take 237 mLs by mouth 2 (two) times daily between meals. Patient not taking: Reported on 10/13/2019 06/20/18   Debbe Odea, MD    Physical Exam: Vitals:   11/19/19 1044 11/19/19 1045 11/19/19 1215  BP:  140/75 (!) 98/51  Pulse: (!) 108 (!) 102 (!) 108  Resp: (!) 30 (!) 25 (!) 25  Temp: (!) 101.5 F (38.6 C)    TempSrc: Rectal    SpO2: 99% 96% 93%    Constitutional: NAD, calm, comfortable, appears dehydrated, thin and lean, on 5 L of oxygen via nasal cannula Eyes: PERRL, lids and conjunctivae normal ENMT: Mucous membranes are moist. Posterior pharynx clear of any exudate or lesions.Normal dentition.  Neck: normal, supple, no masses, no thyromegaly Respiratory: Diffuse crackles noted Cardiovascular: Regular rate and rhythm, no murmurs / rubs / gallops. No extremity edema. 2+ pedal pulses. No carotid bruits.  Abdomen: no tenderness, no masses palpated. No  hepatosplenomegaly. Bowel sounds positive.  Foley in placed Musculoskeletal: no clubbing / cyanosis. No joint deformity upper and lower extremities. Good ROM, no contractures. Normal muscle tone.  Skin: no rashes, lesions, ulcers. No induration Neurologic: Following commands, nods yes and no.  Right-sided paralysis noted.   Labs on Admission: I have personally reviewed following labs and imaging studies  CBC: Recent Labs  Lab 11/19/19 1110  WBC 7.7  NEUTROABS 5.9  HGB 15.5*  HCT 48.9*  MCV 95.0  PLT 782   Basic Metabolic Panel: Recent Labs  Lab 11/19/19 1110  NA 133*  K 4.5  CL 100  CO2 20*  GLUCOSE 159*  BUN 17  CREATININE 0.76  CALCIUM 8.8*   GFR: CrCl cannot be calculated (Unknown ideal weight.). Liver Function Tests: Recent Labs  Lab 11/19/19 1110  AST 21  ALT 17  ALKPHOS 86  BILITOT 1.2  PROT 6.0*  ALBUMIN 2.3*   No results for input(s): LIPASE, AMYLASE in the last 168 hours. No results for input(s): AMMONIA in the last 168 hours. Coagulation Profile: No results for input(s): INR, PROTIME in the last 168 hours. Cardiac Enzymes: No results for input(s): CKTOTAL, CKMB, CKMBINDEX, TROPONINI in the last 168 hours. BNP (last 3 results) No results for input(s): PROBNP in the last 8760 hours. HbA1C: No results for input(s): HGBA1C in the last 72 hours. CBG: No results for input(s): GLUCAP in the last 168 hours. Lipid Profile: No results for input(s): CHOL, HDL, LDLCALC, TRIG, CHOLHDL, LDLDIRECT in the last 72 hours. Thyroid Function Tests: No results for input(s): TSH, T4TOTAL, FREET4, T3FREE, THYROIDAB in the last 72 hours. Anemia Panel: No results for input(s): VITAMINB12, FOLATE, FERRITIN, TIBC, IRON, RETICCTPCT in the last 72 hours. Urine analysis:    Component Value Date/Time   COLORURINE AMBER (A) 11/19/2019 1133   APPEARANCEUR CLOUDY (A) 11/19/2019 1133   LABSPEC 1.019 11/19/2019 1133   PHURINE 5.0 11/19/2019 1133   GLUCOSEU NEGATIVE  11/19/2019 1133   HGBUR SMALL (A) 11/19/2019 1133   BILIRUBINUR NEGATIVE 11/19/2019 1133   KETONESUR NEGATIVE 11/19/2019 1133   PROTEINUR 100 (A) 11/19/2019 1133   UROBILINOGEN 1.0 04/02/2015 1244   NITRITE POSITIVE (A) 11/19/2019 1133   LEUKOCYTESUR LARGE (A) 11/19/2019 1133    Radiological Exams on Admission: DG Chest Port 1 View  Result Date: 11/19/2019 CLINICAL DATA:  Cough and rhonchi in the lower lobes. Low O2 saturation. Possible aspiration. EXAM: PORTABLE CHEST 1 VIEW COMPARISON:  Chest x-ray dated 07/18/2019 and chest CT  dated 03/22/2018 FINDINGS: There are small patchy infiltrates in the right midzone and at the right base which could represent aspiration pneumonitis. The patient has a huge chronic hiatal hernia with secondary slight compressive atelectasis at the left lung base. Heart size and pulmonary vascularity are normal considering the AP portable technique. Aortic atherosclerosis.  No acute bone abnormality. IMPRESSION: Possible small patchy aspiration pneumonitis in the right midzone and at the right base. Aortic Atherosclerosis (ICD10-I70.0). Huge chronic hiatal hernia. Electronically Signed   By: Francene Boyers M.D.   On: 11/19/2019 11:28    EKG: Independently reviewed.  A. fib with right axis deviation.  No ST elevation or depression noted.  Assessment/Plan Principal Problem:   Sepsis (HCC) Active Problems:   Essential hypertension   History of CVA (cerebrovascular accident)   GERD   Diabetes mellitus, type II (HCC)   Hiatal hernia   Hyperlipidemia   Aspiration pneumonia (HCC)   Hyponatremia   Acute hypoxemic respiratory failure (HCC)    Sepsis and acute hypoxemic respiratory failure secondary to aspiration pneumonia: -History of CVA-failed swallow evaluation on previous admission.  Was placed on thickened nectar diet by SLP. -Patient presented with fever of 101.5, tachycardia, tachypnea, hypoxia requiring 5 L of oxygen via nasal cannula.  Chest x-ray  concerning for aspiration pneumonitis in the right mid zone of the right base.  Lactic acid elevated.  COVID-19 negative -Patient received IV Vanco, cefepime and Flagyl in ED. -Admit patient at stepdown unit for close monitoring.  On continuous pulse ox.  We will try to wean off of oxygen as tolerated. -Blood culture is obtained and is pending. -Start on Unasyn.  Start on IV fluids.  Will keep her NPO.  Consult SLP.  Zofran as needed for nausea and vomiting. -Albuterol as needed  A. fib: On Coumadin -Reviewed EKG.  Check electrolytes. -discussed with pharmacy will change to Lovenox as patient is n.p.o.  Hypertension: Stable -Hold losartan, metoprolol for now as patient is NPO.  Hydralazine as needed.  Hyperlipidemia: Hold statin for now  GERD: Hold PPI  Hiatal hernia: Noted on x-ray  History of CVA with right-sided residual weakness and aphasia: Hold aspirin, statin for now.  Consult SLP, PT/OT  UTI? -Patient has Foley catheter placed due to urinary retention about 10 days ago. -UA is positive for nitrites and leukocytes. -Continue Unasyn.  Urine culture is pending.  De-escalate antibiotics based on urine culture result.  CKD stage IIIa: -GFR improved. Cont. IVF  Hyponatremia: -Corrected sodium for hyperglycemia is 134. Cont. IVF, repeat CMP tomorrow am  DVT prophylaxis: Coumadin/SCD Code Status: DNR-confirmed with patient's son at bedside Family Communication: Patient's son present at bedside.  Plan of care discussed with patient son in length and he verbalized understanding and agreed with it. Disposition Plan: To be determined Consults called: None  admission status: Inpatient   Ollen Bowl MD [facility]  If 7PM-7AM, please contact night-coverage [email or web address] Password [facility]  11/19/2019, 12:50 PM

## 2019-11-19 NOTE — Progress Notes (Addendum)
Pharmacy Antibiotic Note  Lisa Hopkins is a 86 y.o. female admitted on 11/19/2019 with possible aspiration and concern for sepsis with possible source as pneumonia.  Pharmacy has been consulted for vancomycin and cefepime dosing. Patient is noted to be on 4L O2 with O2 saturations >90% with a cough. CXR 6/23 showing small patchy infiltrates in right midzone and right base.   Creatinine today seems at baseline at 0.76, baseline appears to be ~0.8-0.9. Last weight 53.2 kg in 09/2019. eCrCl ~ 50 ml/min.  Plan: Vancomycin 500 IV every 12 hours. Goal trough 15-20.  Cefepime 2 grams IV every 12 hours  Follow up MRSA PCR Monitor clinical status, renal function, de-escalation, LOT   **Addendum 1330**  MD would like to discontinue cefepime and vancomycin and use Unasyn to cover for aspiration pneumonia and possible UTI. Will discontinue vancomycin and cefepime and start Unasyn 3 grams IV ervery 6 hours. Monitor as above.   Temp (24hrs), Avg:101.5 F (38.6 C), Min:101.5 F (38.6 C), Max:101.5 F (38.6 C)  Recent Labs  Lab 11/19/19 1110  WBC 7.7  CREATININE 0.76  LATICACIDVEN 3.9*    CrCl cannot be calculated (Unknown ideal weight.).    Allergies  Allergen Reactions  . Ace Inhibitors Cough  . Fosamax [Alendronate Sodium] Other (See Comments)    Heart burn  . Norvasc [Amlodipine Besylate] Other (See Comments)    edema  . Zocor [Simvastatin] Other (See Comments)    Elevated CPK    Antimicrobials this admission: cefepime 6/23 x1 metronidazole 6/23 x1  Vancomycin 6/23 x1 unasyn 6/23 >>  Dose adjustments this admission: N/A  Microbiology results: 6/23 BCx: sent 6/23 UCx: sent  6/23 MRSA PCR: ordered  Thank you for allowing pharmacy to be a part of this patient's care.  Fara Olden, PharmD PGY-1 Pharmacy Resident   Please check amion for clinical pharmacist contact number 11/19/2019 12:53 PM

## 2019-11-19 NOTE — ED Triage Notes (Signed)
Per EMS-pt here from [facility]. Pt here w hx of afib and stroke with aphasia, right sided paralysis at baseline. Uses nectar thickened diet. Possible aspiration- O2 sats noted in 70s w cough. ronchi to lower lobers. EMS noted 88% on 2L. Arrives on 4L. Non verbal at baseline but can nod yes and no. Capnography 29-30. RR 29-30. Foley catheter in place. CBG 203. TMB311. Most form and DNR form.

## 2019-11-20 DIAGNOSIS — Z8673 Personal history of transient ischemic attack (TIA), and cerebral infarction without residual deficits: Secondary | ICD-10-CM

## 2019-11-20 DIAGNOSIS — R6520 Severe sepsis without septic shock: Secondary | ICD-10-CM

## 2019-11-20 DIAGNOSIS — Z7189 Other specified counseling: Secondary | ICD-10-CM | POA: Insufficient documentation

## 2019-11-20 DIAGNOSIS — J9601 Acute respiratory failure with hypoxia: Secondary | ICD-10-CM

## 2019-11-20 DIAGNOSIS — Z66 Do not resuscitate: Secondary | ICD-10-CM | POA: Diagnosis present

## 2019-11-20 DIAGNOSIS — J690 Pneumonitis due to inhalation of food and vomit: Secondary | ICD-10-CM

## 2019-11-20 DIAGNOSIS — A419 Sepsis, unspecified organism: Secondary | ICD-10-CM

## 2019-11-20 DIAGNOSIS — I69391 Dysphagia following cerebral infarction: Secondary | ICD-10-CM

## 2019-11-20 DIAGNOSIS — Z515 Encounter for palliative care: Secondary | ICD-10-CM | POA: Insufficient documentation

## 2019-11-20 LAB — COMPREHENSIVE METABOLIC PANEL
ALT: 12 U/L (ref 0–44)
AST: 15 U/L (ref 15–41)
Albumin: 1.7 g/dL — ABNORMAL LOW (ref 3.5–5.0)
Alkaline Phosphatase: 66 U/L (ref 38–126)
Anion gap: 8 (ref 5–15)
BUN: 13 mg/dL (ref 8–23)
CO2: 19 mmol/L — ABNORMAL LOW (ref 22–32)
Calcium: 7.5 mg/dL — ABNORMAL LOW (ref 8.9–10.3)
Chloride: 110 mmol/L (ref 98–111)
Creatinine, Ser: 0.55 mg/dL (ref 0.44–1.00)
GFR calc Af Amer: >60 mL/min (ref >60)
GFR calc non Af Amer: >60 mL/min (ref >60)
Glucose, Bld: 100 mg/dL — ABNORMAL HIGH (ref 70–99)
Potassium: 3.5 mmol/L (ref 3.5–5.1)
Sodium: 137 mmol/L (ref 135–145)
Total Bilirubin: 1.1 mg/dL (ref 0.3–1.2)
Total Protein: 4.6 g/dL — ABNORMAL LOW (ref 6.5–8.1)

## 2019-11-20 LAB — CBC
HCT: 36.1 % (ref 36.0–46.0)
Hemoglobin: 11.2 g/dL — ABNORMAL LOW (ref 12.0–15.0)
MCH: 30.7 pg (ref 26.0–34.0)
MCHC: 31 g/dL (ref 30.0–36.0)
MCV: 98.9 fL (ref 80.0–100.0)
Platelets: 219 10*3/uL (ref 150–400)
RBC: 3.65 MIL/uL — ABNORMAL LOW (ref 3.87–5.11)
RDW: 14 % (ref 11.5–15.5)
WBC: 7.6 10*3/uL (ref 4.0–10.5)
nRBC: 0 % (ref 0.0–0.2)

## 2019-11-20 LAB — PROTIME-INR
INR: 1.7 — ABNORMAL HIGH (ref 0.8–1.2)
Prothrombin Time: 19.5 seconds — ABNORMAL HIGH (ref 11.4–15.2)

## 2019-11-20 LAB — MRSA PCR SCREENING: MRSA by PCR: NEGATIVE

## 2019-11-20 LAB — PROCALCITONIN: Procalcitonin: 2.2 ng/mL

## 2019-11-20 LAB — CORTISOL-AM, BLOOD: Cortisol - AM: 20.9 ug/dL (ref 6.7–22.6)

## 2019-11-20 MED ORDER — ACETAMINOPHEN 325 MG PO TABS: 650.0000 mg | ORAL_TABLET | Freq: Four times a day (QID) | ORAL | Status: DC | PRN

## 2019-11-20 MED ORDER — MORPHINE SULFATE (CONCENTRATE) 10 MG/0.5ML PO SOLN: 5.0000 mg | ORAL | Status: DC | PRN

## 2019-11-20 MED ORDER — MORPHINE SULFATE (PF) 2 MG/ML IV SOLN
2.0000 mg | INTRAVENOUS | Status: AC
  Administered 2019-11-20: 2 mg via INTRAVENOUS

## 2019-11-20 MED ORDER — HALOPERIDOL LACTATE 5 MG/ML IJ SOLN: 0.5000 mg | INTRAMUSCULAR | Status: DC | PRN

## 2019-11-20 MED ORDER — LORAZEPAM 1 MG PO TABS: 1.0000 mg | ORAL_TABLET | ORAL | Status: DC | PRN

## 2019-11-20 MED ORDER — LORAZEPAM 2 MG/ML IJ SOLN: 1.0000 mg | INTRAMUSCULAR | Status: DC | PRN

## 2019-11-20 MED ORDER — LORAZEPAM 2 MG/ML PO CONC: 1.0000 mg | ORAL | Status: DC | PRN

## 2019-11-20 MED ORDER — BIOTENE DRY MOUTH MT LIQD: 15.0000 mL | OROMUCOSAL | Status: DC | PRN

## 2019-11-20 MED ORDER — GLYCOPYRROLATE 0.2 MG/ML IJ SOLN: 0.2000 mg | INTRAMUSCULAR | Status: DC | PRN

## 2019-11-20 MED ORDER — ACETAMINOPHEN 650 MG RE SUPP: 650.0000 mg | Freq: Four times a day (QID) | RECTAL | Status: DC | PRN

## 2019-11-20 MED ORDER — HALOPERIDOL LACTATE 2 MG/ML PO CONC
0.5000 mg | ORAL | Status: DC | PRN
  Filled 2019-11-20: qty 0.3

## 2019-11-20 MED ORDER — GLYCOPYRROLATE 1 MG PO TABS
1.0000 mg | ORAL_TABLET | ORAL | Status: DC | PRN
  Filled 2019-11-20: qty 1

## 2019-11-20 MED ORDER — POLYVINYL ALCOHOL 1.4 % OP SOLN
1.0000 [drp] | Freq: Four times a day (QID) | OPHTHALMIC | Status: DC | PRN
  Filled 2019-11-20: qty 15

## 2019-11-20 MED ORDER — MORPHINE SULFATE (PF) 2 MG/ML IV SOLN
2.0000 mg | INTRAVENOUS | Status: DC | PRN
  Filled 2019-11-20: qty 1

## 2019-11-20 MED ORDER — MORPHINE SULFATE 10 MG/5ML PO SOLN: 5.0000 mg | ORAL | Status: DC

## 2019-11-20 MED ORDER — HALOPERIDOL 1 MG PO TABS: 0.5000 mg | ORAL_TABLET | ORAL | Status: DC | PRN

## 2019-11-20 NOTE — ED Notes (Signed)
Chaplain checking in with pt and family.

## 2019-11-20 NOTE — Progress Notes (Signed)
   11/20/19 1448  Clinical Encounter Type  Visited With Patient and family together  Visit Type Initial;Spiritual support  Referral From Palliative care team  Consult/Referral To Chaplain  Spiritual Encounters  Spiritual Needs Prayer  The chaplain responded to PMT consult for spiritual care.  The Pt. son-Jim is bedside at the time of the visit.  The chaplain understands from conversations with Rosanne Ashing and RN-Elizabeth plans are for the Pt. to transfer to [facility] soon. Rosanne Ashing expressed his willingness to accept Hospice Care for the Pt. The chaplain understands [employer] will offer spiritual care to the Pt. after she is settled into [facility]. Rosanne Ashing shared friends and family are available as needed for him.  The Pt. accepted the chaplain's invitation for prayer. F/U spiritual care is available as needed.

## 2019-11-20 NOTE — Progress Notes (Addendum)
*  2:55pm:  Lanora Manis, RN, made aware that consents are signed and patient can be transported.*  Civil engineer, contracting [facility]) Hospital Liaison note.   Received request from Lora Havens, NP with Palliative Medical Team, for family interest in [facility]. Chart reviewed and eligibility confirmed. Spoke with family to confirm interest and explain services. Son Rosanne Ashing agreeable to transfer today.    [facility] will notify ED RN Lanora Manis when registration paperwork has been completed to arrange transport.   RN please call report to [phone]. Please send DNR with patient.  Thank you for the opportunity to participate in this patient's care.   Chrislyn Brooke Dare, BSN, RN [facility] Liaison (listed on AMION under Hospice[facility])    [phone]

## 2019-11-20 NOTE — ED Notes (Signed)
Called ptar 

## 2019-11-20 NOTE — Progress Notes (Signed)
PROGRESS NOTE    Lisa Hopkins  ELF:[id number] DOB: 19-Dec-1924 DOA: 11/19/2019 PCP: Lucky Cowboy, MD   Brief Narrative:  Patient is a 86 year old female with history of permanent A. fib on Coumadin, nonhemorrhagic CVA with right residual weakness, aphasia, hypertension, diabetes type 2, hiatal hernia, GERD, hyperlipidemia who presents to the emergency department from [facility] skilled nursing facility for the concern of aspiration pneumonia.  Patient is nonverbal at baseline.  There was concern of aspiration, wet cough with fever and her oxygenation dropped to 80s requiring 2 L of oxygen per minute.  On presentation she was saturating in the range of 70s requiring 5 Lof oxygen per minute.  She recently had Foley placed for urinary retention.  She was admitted on 10/13/2019 with ischemic stroke and was discharged to skilled facility.  On presentation she had fever of 101.5 degree, tachycardic, tachypneic, Elevated lactate, UA was positive for nitrites and leukocytes.  Chest x-ray was concerning for aspiration pneumonitis.  Patient was started on broad-spectrum antibiotics for aspiration pneumonia.  After discussion by palliative care with family, family was interested on full comfort care.  Comfort care started. Plan is to discharge her to residential hospice  Assessment & Plan:   Principal Problem:   Sepsis (HCC) Active Problems:   Essential hypertension   History of CVA (cerebrovascular accident)   GERD   Diabetes mellitus, type II (HCC)   Hiatal hernia   Hyperlipidemia   Aspiration pneumonia (HCC)   Hyponatremia   Acute hypoxemic respiratory failure (HCC)   Acute hypoxic respiratory failure secondary to aspiration pneumonia: Presented with fever, lactic acidosis, decreased saturation, tachypnea, tachycardia.  Chest x-ray was concerning for aspiration pneumonitis .  Covid test 19 screening test negative.  She was started on Augmentin.  Now on comfort  Suspected sepsis:  Presented with fever, leukocytosis, lactic acidosis.  Continue current antibiotics..  UA was also concerning for UTI.  Not sure whether patient had dysuria.   Permanent A. fib: Currently rate is controlled.  She was on  Coumadin for anticoagulation.    Hypertension: Meds stopped  Hyperlipidemia: Takes statin   GERD: She was on PPI  History of nonhemorrhagic CVA: She was admitted in May for the management of CVA.  She has residual right-sided weakness and aphasia.  On aspirin, statin.  Urinary retention: Recently had a Foley catheter placement for retention.  CKD stage IIIa: Currently kidney function at baseline.  Hyponatremia: Stable         DVT prophylaxis:None Code Status: Comfort care Family Communication: With son at bedside Status is: Inpatient  Remains inpatient appropriate because:Inpatient level of care appropriate due to severity of illness   Dispo: The patient is from: SNF              Anticipated d/c is to: Residential Hospice              Anticipated d/c date HE:NIDPO or tomorrow             Consultants: Palliative care  Procedures:None  Antimicrobials:  Anti-infectives (From admission, onward)   Start     Dose/Rate Route Frequency Ordered Stop   11/19/19 2300  vancomycin (VANCOREADY) IVPB 500 mg/100 mL  Status:  Discontinued        500 mg 100 mL/hr over 60 Minutes Intravenous Every 12 hours 11/19/19 1257 11/19/19 1328   11/19/19 2300  ceFEPIme (MAXIPIME) 2 g in sodium chloride 0.9 % 100 mL IVPB  Status:  Discontinued  2 g 200 mL/hr over 30 Minutes Intravenous Every 12 hours 11/19/19 1257 11/19/19 1328   11/19/19 2300  Ampicillin-Sulbactam (UNASYN) 3 g in sodium chloride 0.9 % 100 mL IVPB     Discontinue     3 g 200 mL/hr over 30 Minutes Intravenous Every 6 hours 11/19/19 1328     11/19/19 1100  ceFEPIme (MAXIPIME) 2 g in sodium chloride 0.9 % 100 mL IVPB        2 g 200 mL/hr over 30 Minutes Intravenous  Once 11/19/19 1046 11/19/19 1241    11/19/19 1100  metroNIDAZOLE (FLAGYL) IVPB 500 mg        500 mg 100 mL/hr over 60 Minutes Intravenous  Once 11/19/19 1046 11/19/19 1221   11/19/19 1100  vancomycin (VANCOCIN) IVPB 1000 mg/200 mL premix        1,000 mg 200 mL/hr over 60 Minutes Intravenous  Once 11/19/19 1046 11/19/19 1241      Subjective:  Patient seen and examined at the bedside this morning.  Son was at the bedside.  She was found to be very weak, deconditioned, debilitated.  She has aphasia.  She did not look in any kind of significant distress.  Objective: Vitals:   11/20/19 0400 11/20/19 0600 11/20/19 0700 11/20/19 0844  BP: 136/84 (!) 147/80 137/82   Pulse: (!) 101 99  94  Resp: (!) 25 20 (!) 22 (!) 21  Temp:      TempSrc:      SpO2: 97% 97%  98%    Intake/Output Summary (Last 24 hours) at 11/20/2019 0900 Last data filed at 11/20/2019 0844 Gross per 24 hour  Intake 1500.96 ml  Output 600 ml  Net 900.96 ml   There were no vitals filed for this visit.  Examination:  General exam: Extremely deconditioned, debilitated elderly female Respiratory system: No wheezes or crackles heard Cardiovascular system: Afib Gastrointestinal system: Abdomen is nondistended, soft and nontender. No organomegaly or masses felt. Normal bowel sounds heard. Central nervous system: Alert and awake Extremities: No edema, no clubbing ,no cyanosis Skin: No rashes, lesions or ulcers,no icterus ,no pallor  Data Reviewed: I have personally reviewed following labs and imaging studies  CBC: Recent Labs  Lab 11/19/19 1110 11/20/19 0526  WBC 7.7 7.6  NEUTROABS 5.9  --   HGB 15.5* 11.2*  HCT 48.9* 36.1  MCV 95.0 98.9  PLT 193 182   Basic Metabolic Panel: Recent Labs  Lab 11/19/19 1110 11/19/19 1342 11/20/19 0526  NA 133*  --  137  K 4.5  --  3.5  CL 100  --  110  CO2 20*  --  19*  GLUCOSE 159*  --  100*  BUN 17  --  13  CREATININE 0.76  --  0.55  CALCIUM 8.8*  --  7.5*  MG  --  1.7  --   PHOS  --  3.0  --     GFR: CrCl cannot be calculated (Unknown ideal weight.). Liver Function Tests: Recent Labs  Lab 11/19/19 1110 11/20/19 0526  AST 21 15  ALT 17 12  ALKPHOS 86 66  BILITOT 1.2 1.1  PROT 6.0* 4.6*  ALBUMIN 2.3* 1.7*   No results for input(s): LIPASE, AMYLASE in the last 168 hours. No results for input(s): AMMONIA in the last 168 hours. Coagulation Profile: Recent Labs  Lab 11/19/19 1242 11/20/19 0526  INR 1.5* 1.7*   Cardiac Enzymes: No results for input(s): CKTOTAL, CKMB, CKMBINDEX, TROPONINI in the last 168 hours. BNP (  last 3 results) No results for input(s): PROBNP in the last 8760 hours. HbA1C: No results for input(s): HGBA1C in the last 72 hours. CBG: No results for input(s): GLUCAP in the last 168 hours. Lipid Profile: No results for input(s): CHOL, HDL, LDLCALC, TRIG, CHOLHDL, LDLDIRECT in the last 72 hours. Thyroid Function Tests: No results for input(s): TSH, T4TOTAL, FREET4, T3FREE, THYROIDAB in the last 72 hours. Anemia Panel: No results for input(s): VITAMINB12, FOLATE, FERRITIN, TIBC, IRON, RETICCTPCT in the last 72 hours. Sepsis Labs: Recent Labs  Lab 11/19/19 1110 11/19/19 1400  LATICACIDVEN 3.9* 2.6*    Recent Results (from the past 240 hour(s))  SARS Coronavirus 2 by RT PCR (hospital order, performed in [facility] hospital lab) Nasopharyngeal Nasopharyngeal Swab     Status: None   Collection Time: 11/19/19 11:10 AM   Specimen: Nasopharyngeal Swab  Result Value Ref Range Status   SARS Coronavirus 2 NEGATIVE NEGATIVE Final    Comment: (NOTE) SARS-CoV-2 target nucleic acids are NOT DETECTED.  The SARS-CoV-2 RNA is generally detectable in upper and lower respiratory specimens during the acute phase of infection. The lowest concentration of SARS-CoV-2 viral copies this assay can detect is 250 copies / mL. A negative result does not preclude SARS-CoV-2 infection and should not be used as the sole basis for treatment or other patient management  decisions.  A negative result may occur with improper specimen collection / handling, submission of specimen other than nasopharyngeal swab, presence of viral mutation(s) within the areas targeted by this assay, and inadequate number of viral copies (<250 copies / mL). A negative result must be combined with clinical observations, patient history, and epidemiological information.  Fact Sheet for Patients:   BoilerBrush.com.cy  Fact Sheet for Healthcare Providers: https://pope.com/  This test is not yet approved or  cleared by the [place] FDA and has been authorized for detection and/or diagnosis of SARS-CoV-2 by FDA under an Emergency Use Authorization (EUA).  This EUA will remain in effect (meaning this test can be used) for the duration of the COVID-19 declaration under Section 564(b)(1) of the Act, 21 U.S.C. section 360bbb-3(b)(1), unless the authorization is terminated or revoked sooner.  Performed at [facility], [address] [place], [place] [address]   MRSA PCR Screening     Status: None   Collection Time: 11/19/19  1:42 PM   Specimen: Nasal Mucosa; Nasopharyngeal  Result Value Ref Range Status   MRSA by PCR NEGATIVE NEGATIVE Final    Comment:        The GeneXpert MRSA Assay (FDA approved for NASAL specimens only), is one component of a comprehensive MRSA colonization surveillance program. It is not intended to diagnose MRSA infection nor to guide or monitor treatment for MRSA infections. Performed at [facility], [address] [place], [place] [address]          Radiology Studies: DG Chest Port 1 View  Result Date: 11/19/2019 CLINICAL DATA:  Cough and rhonchi in the lower lobes. Low O2 saturation. Possible aspiration. EXAM: PORTABLE CHEST 1 VIEW COMPARISON:  Chest x-ray dated 07/18/2019 and chest CT dated 03/22/2018 FINDINGS: There are small patchy infiltrates in the right midzone and at  the right base which could represent aspiration pneumonitis. The patient has a huge chronic hiatal hernia with secondary slight compressive atelectasis at the left lung base. Heart size and pulmonary vascularity are normal considering the AP portable technique. Aortic atherosclerosis.  No acute bone abnormality. IMPRESSION: Possible small patchy aspiration  pneumonitis in the right midzone and at the right base. Aortic Atherosclerosis (ICD10-I70.0). Huge chronic hiatal hernia. Electronically Signed   By: Francene Boyers M.D.   On: 11/19/2019 11:28        Scheduled Meds: . enoxaparin (LOVENOX) injection  53 mg Subcutaneous Q12H   Continuous Infusions: . sodium chloride 100 mL/hr at 11/20/19 0127  . ampicillin-sulbactam (UNASYN) IV Stopped (11/20/19 0606)     LOS: 1 day    Time spent:25 mins. More than 50% of that time was spent in counseling and/or coordination of care.      Burnadette Pop, MD [facility] P6/24/2021, 9:00 AM

## 2019-11-20 NOTE — ED Notes (Signed)
Oral care provided. Pulse ox not picking up on finger, moved to ear lobe. sats @ 100%. Pt comfortable and resting now. Foley bag emptied of amber sediment urine.

## 2019-11-20 NOTE — ED Notes (Signed)
This RN asked NS to follow up with [facility]. Pt is 7th in line. Called pt son to update him.

## 2019-11-20 NOTE — ED Notes (Addendum)
Updated pt on transport and that I had spoken with her son. She asked me to turn off the TV. Pt in NAD.

## 2019-11-20 NOTE — Evaluation (Signed)
Clinical/Bedside Swallow Evaluation Patient Details  Name: Lisa Hopkins MRN: [id number] Date of Birth: July 31, 1924  Today's Date: 11/20/2019 Time: SLP Start Time (ACUTE ONLY): 1122 SLP Stop Time (ACUTE ONLY): 1134 SLP Time Calculation (min) (ACUTE ONLY): 12 min  Past Medical History:  Past Medical History:  Diagnosis Date  . Anxiety   . Aphasia as late effect of cerebrovascular accident   . Atrial fibrillation (HCC)   . CKD (chronic kidney disease), stage II   . CVA (cerebral infarction)   . Diabetes mellitus type II   . DVT, lower extremity, distal, acute, right (HCC) 03/25/2018   Right peroneal vein 03/22/2018  . GERD (gastroesophageal reflux disease)   . Hiatal hernia   . HTN (hypertension)   . Hyperlipidemia   . IBS (irritable bowel syndrome)   . Stroke (HCC)   . Vitamin D deficiency    Past Surgical History:  Past Surgical History:  Procedure Laterality Date  . CATARACT EXTRACTION, BILATERAL    . CHOLECYSTECTOMY    . COLONOSCOPY  2005  . ESOPHAGOGASTRODUODENOSCOPY (EGD) WITH PROPOFOL N/A 11/28/2017   Procedure: ESOPHAGOGASTRODUODENOSCOPY (EGD) WITH PROPOFOL;  Surgeon: Graylin Shiver, MD;  Location: [facility] ENDOSCOPY;  Service: Endoscopy;  Laterality: N/A;  . TONSILLECTOMY    . UPPER GASTROINTESTINAL ENDOSCOPY  2005   HPI:  86 y.o. female  with past medical history of a. Fib, HTN, CVA, recent admission 5/17-5/21 for infarct resulting in L sided paralysis, dysphagia and aphasia admitted on 11/19/2019 with sepsis related to aspiration pneumonia. Pt known to SLP services from prior admissions over the years, with complicated esophageal then pharyngeal dysphagia.  Last MBS during May admission led to recommendations for nectar thick liquids with a chin tuck to minimize aspiration.     Assessment / Plan / Recommendation Clinical Impression  Pt alert, pleasant, endorses that she was not feeling well.  Repositioned with help from CNA to optimize participation. Oral care provided.   Pt consumed limited ice chips with active mastication, palpable swallow response, followed by intermittent, weak cough.  Boluses of applesauce were swallowed without overt s/s of difficulty, no coughing, but multiple sub-swallows per bolus.  Pt's son arrived upon completion of assessment; we discussed current status and concerns for ongoing aspiration, particularly in the context of current dx.  We discussed maintaining NPO status for now, but allowing critical meds crushed in applesauce with subsequent f/u from SLP once pt is admitted.  In the time since pt was evaluated, she was seen by Palliative Medicine. Pt is now full comfort, and she will be D/Cd to a residential hospice facility. Given this decision, our service will respectfully sign off.    SLP Visit Diagnosis: Dysphagia, unspecified (R13.10)    Aspiration Risk    high   Diet Recommendation   comfort POs       Other  Recommendations     Follow up Recommendations   no SLP f/u     Frequency and Duration            Prognosis        Swallow Study   General HPI: 86 y.o. female  with past medical history of a. Fib, HTN, CVA, recent admission 5/17-5/21 for infarct resulting in L sided paralysis, dysphagia and aphasia admitted on 11/19/2019 with sepsis related to aspiration pneumonia. Pt known to SLP services from prior admissions over the years, with complicated esophageal then pharyngeal dysphagia.  Last MBS during May admission led to recommendations for nectar thick liquids with a  chin tuck to minimize aspiration.   Type of Study: Bedside Swallow Evaluation Previous Swallow Assessment: see HPI Diet Prior to this Study: NPO Temperature Spikes Noted: No Respiratory Status: Nasal cannula History of Recent Intubation: No Behavior/Cognition: Alert;Cooperative Oral Cavity Assessment: Within Functional Limits Oral Care Completed by SLP: Yes Oral Cavity - Dentition: Missing dentition Vision: Functional for self-feeding Self-Feeding  Abilities: Needs assist Patient Positioning: Upright in bed Baseline Vocal Quality: Hoarse Volitional Cough: Weak Volitional Swallow: Able to elicit    Oral/Motor/Sensory Function Overall Oral Motor/Sensory Function: Within functional limits   Ice Chips Ice chips: Impaired Presentation: Spoon Pharyngeal Phase Impairments: Multiple swallows;Cough - Immediate   Thin Liquid Thin Liquid: Not tested    Nectar Thick Nectar Thick Liquid: Not tested   Honey Thick Honey Thick Liquid: Not tested   Puree Puree: Impaired Presentation: Spoon Pharyngeal Phase Impairments: Multiple swallows   Solid     Solid: Not tested      Juan Quam Laurice 11/20/2019,1:11 PM   Estill Bamberg L. Tivis Ringer, [place] Office number [phone] Pager [phone]

## 2019-11-20 NOTE — Discharge Summary (Signed)
Physician Discharge Summary  Lisa Hopkins KGM:[id number] DOB: 1925-02-25 DOA: 11/19/2019  PCP: Unk Pinto, MD  Admit date: 11/19/2019 Discharge date: 11/20/2019  Admitted From: Home Disposition:  Residential Hospice  Discharge Condition:Stable CODE STATUS:Comfort Care   Brief/Interim Summary:  Patient is a 86 year old female with history of permanent A. fib on Coumadin, nonhemorrhagic CVA with right residual weakness, aphasia, hypertension, diabetes type 2, hiatal hernia, GERD, hyperlipidemia who presents to the emergency department from [facility] skilled nursing facility for the concern of aspiration pneumonia.  Patient is nonverbal at baseline.  There was concern of aspiration, wet cough with fever and her oxygenation dropped to 80s requiring 2 L of oxygen per minute.  On presentation she was saturating in the range of 70s requiring 5 Lof oxygen per minute.  She recently had Foley placed for urinary retention.  She was admitted on 10/13/2019 with ischemic stroke and was discharged to skilled facility.  On presentation she had fever of 101.5 degree, tachycardic, tachypneic, Elevated lactate, UA was positive for nitrites and leukocytes.  Chest x-ray was concerning for aspiration pneumonitis.  Patient was started on broad-spectrum antibiotics for aspiration pneumonia.  After discussion by palliative care with family, family was interested on full comfort care.  Comfort care started. Plan is to discharge her to residential hospice  Problems addressed:  Acute hypoxic respiratory failure secondary to aspiration pneumonia: Presented with fever, lactic acidosis, decreased saturation, tachypnea, tachycardia.  Chest x-ray was concerning for aspiration pneumonitis .  Covid test 19 screening test negative.  She was started on Augmentin.  Now on comfort  Suspected sepsis: Presented with fever, leukocytosis, lactic acidosis.  Continue current antibiotics..  UA was also concerning for UTI.  Not  sure whether patient had dysuria.   Permanent A. fib: Currently rate is controlled.  She was on  Coumadin for anticoagulation.    Hypertension: Meds stopped  Hyperlipidemia: Takes statin   GERD: She was on PPI  History of nonhemorrhagic CVA: She was admitted in May for the management of CVA.  She has residual right-sided weakness and aphasia.  On aspirin, statin.  Urinary retention: Recently had a Foley catheter placement for retention.  CKD stage IIIa: Currently kidney function at baseline.  Hyponatremia: Stable  Discharge Diagnoses:  Principal Problem:   Sepsis ([place]) Active Problems:   Essential hypertension   History of CVA (cerebrovascular accident)   GERD   Diabetes mellitus, type II ([place])   Hiatal hernia   Hyperlipidemia   Aspiration pneumonia ([place])   Hyponatremia   Acute hypoxemic respiratory failure ([place])   DNR (do not resuscitate)    Discharge Instructions   Allergies as of 11/20/2019      Reactions   Ace Inhibitors Cough   Fosamax [alendronate Sodium] Other (See Comments)   Heart burn   Norvasc [amlodipine Besylate] Other (See Comments)   edema   Zocor [simvastatin] Other (See Comments)   Elevated CPK      Medication List    STOP taking these medications   aspirin 81 MG EC tablet   atorvastatin 20 MG tablet Commonly known as: LIPITOR   CINNAMON PO   dicyclomine 20 MG tablet Commonly known as: Bentyl   feeding supplement (ENSURE ENLIVE) Liqd   furosemide 20 MG tablet Commonly known as: LASIX   loperamide 2 MG capsule Commonly known as: IMODIUM   losartan 50 MG tablet Commonly known as: COZAAR   melatonin 3 MG Tabs tablet   metoprolol tartrate 25 MG tablet Commonly known as: [employer]  nystatin powder Generic drug: nystatin   ondansetron 4 MG disintegrating tablet Commonly known as: Zofran ODT   pantoprazole 40 MG tablet Commonly known as: PROTONIX   potassium chloride 10 MEQ tablet Commonly known as: KLOR-CON    QUEtiapine 25 MG tablet Commonly known as: SEROQUEL   senna 8.6 MG Tabs tablet Commonly known as: SENOKOT   Vitamin D3 125 MCG (5000 UT) Tabs   warfarin 2 MG tablet Commonly known as: COUMADIN       Allergies  Allergen Reactions   Ace Inhibitors Cough   Fosamax [Alendronate Sodium] Other (See Comments)    Heart burn   Norvasc [Amlodipine Besylate] Other (See Comments)    edema   Zocor [Simvastatin] Other (See Comments)    Elevated CPK    Consultations:  Palliative care   Procedures/Studies: DG Chest Port 1 View  Result Date: 11/19/2019 CLINICAL DATA:  Cough and rhonchi in the lower lobes. Low O2 saturation. Possible aspiration. EXAM: PORTABLE CHEST 1 VIEW COMPARISON:  Chest x-ray dated 07/18/2019 and chest CT dated 03/22/2018 FINDINGS: There are small patchy infiltrates in the right midzone and at the right base which could represent aspiration pneumonitis. The patient has a huge chronic hiatal hernia with secondary slight compressive atelectasis at the left lung base. Heart size and pulmonary vascularity are normal considering the AP portable technique. Aortic atherosclerosis.  No acute bone abnormality. IMPRESSION: Possible small patchy aspiration pneumonitis in the right midzone and at the right base. Aortic Atherosclerosis (ICD10-I70.0). Huge chronic hiatal hernia. Electronically Signed   By: Francene Boyers M.D.   On: 11/19/2019 11:28          Discharge Exam: Vitals:   11/20/19 0700 11/20/19 0844  BP: 137/82   Pulse:  94  Resp: (!) 22 (!) 21  Temp:    SpO2:  98%   Vitals:   11/20/19 0400 11/20/19 0600 11/20/19 0700 11/20/19 0844  BP: 136/84 (!) 147/80 137/82   Pulse: (!) 101 99  94  Resp: (!) 25 20 (!) 22 (!) 21  Temp:      TempSrc:      SpO2: 97% 97%  98%    General: Pt is weak, sleepy, not in acute distress Cardiovascular: Afib , no rubs, no gallops Respiratory: CTA bilaterally, no wheezing, no rhonchi Abdominal: Soft, NT, ND, bowel sounds  + Extremities: no edema, no cyanosis    The results of significant diagnostics from this hospitalization (including imaging, microbiology, ancillary and laboratory) are listed below for reference.     Microbiology: Recent Results (from the past 240 hour(s))  SARS Coronavirus 2 by RT PCR (hospital order, performed in [facility] hospital lab) Nasopharyngeal Nasopharyngeal Swab     Status: None   Collection Time: 11/19/19 11:10 AM   Specimen: Nasopharyngeal Swab  Result Value Ref Range Status   SARS Coronavirus 2 NEGATIVE NEGATIVE Final    Comment: (NOTE) SARS-CoV-2 target nucleic acids are NOT DETECTED.  The SARS-CoV-2 RNA is generally detectable in upper and lower respiratory specimens during the acute phase of infection. The lowest concentration of SARS-CoV-2 viral copies this assay can detect is 250 copies / mL. A negative result does not preclude SARS-CoV-2 infection and should not be used as the sole basis for treatment or other patient management decisions.  A negative result may occur with improper specimen collection / handling, submission of specimen other than nasopharyngeal swab, presence of viral mutation(s) within the areas targeted by this assay, and inadequate number of viral copies (<250 copies /  mL). A negative result must be combined with clinical observations, patient history, and epidemiological information.  Fact Sheet for Patients:   BoilerBrush.com.cyhttps://www.fda.gov/media/136312/download  Fact Sheet for Healthcare Providers: https://pope.com/https://www.fda.gov/media/136313/download  This test is not yet approved or  cleared by the [place] FDA and has been authorized for detection and/or diagnosis of SARS-CoV-2 by FDA under an Emergency Use Authorization (EUA).  This EUA will remain in effect (meaning this test can be used) for the duration of the COVID-19 declaration under Section 564(b)(1) of the Act, 21 U.S.C. section 360bbb-3(b)(1), unless the authorization is terminated  or revoked sooner.  Performed at [facility], [address] [place], [place] [address]   Blood Culture (routine x 2)     Status: None (Preliminary result)   Collection Time: 11/19/19 11:11 AM   Specimen: BLOOD LEFT FOREARM  Result Value Ref Range Status   Specimen Description BLOOD LEFT FOREARM  Final   Special Requests   Final    BOTTLES DRAWN AEROBIC AND ANAEROBIC Blood Culture results may not be optimal due to an inadequate volume of blood received in culture bottles   Culture   Final    NO GROWTH < 24 HOURS Performed at [facility], [address] [place], [place] [address]    Report Status PENDING  Incomplete  Blood Culture (routine x 2)     Status: None (Preliminary result)   Collection Time: 11/19/19 11:11 AM   Specimen: BLOOD LEFT ARM  Result Value Ref Range Status   Specimen Description BLOOD LEFT ARM  Final   Special Requests   Final    BOTTLES DRAWN AEROBIC AND ANAEROBIC Blood Culture adequate volume   Culture   Final    NO GROWTH < 24 HOURS Performed at [facility], [address] [place], [place] [address]    Report Status PENDING  Incomplete  Urine culture     Status: Abnormal (Preliminary result)   Collection Time: 11/19/19 11:33 AM   Specimen: In/Out Cath Urine  Result Value Ref Range Status   Specimen Description IN/OUT CATH URINE  Final   Special Requests   Final    NONE Performed at [facility], [address] [place], [place] [address]    Culture 70,000 COLONIES/mL GRAM NEGATIVE RODS (A)  Final   Report Status PENDING  Incomplete  MRSA PCR Screening     Status: None   Collection Time: 11/19/19  1:42 PM   Specimen: Nasal Mucosa; Nasopharyngeal  Result Value Ref Range Status   MRSA by PCR NEGATIVE NEGATIVE Final    Comment:        The GeneXpert MRSA Assay (FDA approved for NASAL specimens only), is one component of a comprehensive MRSA colonization surveillance program. It is not intended to diagnose  MRSA infection nor to guide or monitor treatment for MRSA infections. Performed at [facility], [address] [place], [place] [address]      Labs: BNP (last 3 results) No results for input(s): BNP in the last 8760 hours. Basic Metabolic Panel: Recent Labs  Lab 11/19/19 1110 11/19/19 1342 11/20/19 0526  NA 133*  --  137  K 4.5  --  3.5  CL 100  --  110  CO2 20*  --  19*  GLUCOSE 159*  --  100*  BUN 17  --  13  CREATININE 0.76  --  0.55  CALCIUM 8.8*  --  7.5*  MG  --  1.7  --  PHOS  --  3.0  --    Liver Function Tests: Recent Labs  Lab 11/19/19 1110 11/20/19 0526  AST 21 15  ALT 17 12  ALKPHOS 86 66  BILITOT 1.2 1.1  PROT 6.0* 4.6*  ALBUMIN 2.3* 1.7*   No results for input(s): LIPASE, AMYLASE in the last 168 hours. No results for input(s): AMMONIA in the last 168 hours. CBC: Recent Labs  Lab 11/19/19 1110 11/20/19 0526  WBC 7.7 7.6  NEUTROABS 5.9  --   HGB 15.5* 11.2*  HCT 48.9* 36.1  MCV 95.0 98.9  PLT 193 219   Cardiac Enzymes: No results for input(s): CKTOTAL, CKMB, CKMBINDEX, TROPONINI in the last 168 hours. BNP: Invalid input(s): POCBNP CBG: No results for input(s): GLUCAP in the last 168 hours. D-Dimer No results for input(s): DDIMER in the last 72 hours. Hgb A1c No results for input(s): HGBA1C in the last 72 hours. Lipid Profile No results for input(s): CHOL, HDL, LDLCALC, TRIG, CHOLHDL, LDLDIRECT in the last 72 hours. Thyroid function studies No results for input(s): TSH, T4TOTAL, T3FREE, THYROIDAB in the last 72 hours.  Invalid input(s): FREET3 Anemia work up No results for input(s): VITAMINB12, FOLATE, FERRITIN, TIBC, IRON, RETICCTPCT in the last 72 hours. Urinalysis    Component Value Date/Time   COLORURINE AMBER (A) 11/19/2019 1133   APPEARANCEUR CLOUDY (A) 11/19/2019 1133   LABSPEC 1.019 11/19/2019 1133   PHURINE 5.0 11/19/2019 1133   GLUCOSEU NEGATIVE 11/19/2019 1133   HGBUR SMALL (A) 11/19/2019 1133    BILIRUBINUR NEGATIVE 11/19/2019 1133   KETONESUR NEGATIVE 11/19/2019 1133   PROTEINUR 100 (A) 11/19/2019 1133   UROBILINOGEN 1.0 04/02/2015 1244   NITRITE POSITIVE (A) 11/19/2019 1133   LEUKOCYTESUR LARGE (A) 11/19/2019 1133   Sepsis Labs Invalid input(s): PROCALCITONIN,  WBC,  LACTICIDVEN Microbiology Recent Results (from the past 240 hour(s))  SARS Coronavirus 2 by RT PCR (hospital order, performed in [facility] hospital lab) Nasopharyngeal Nasopharyngeal Swab     Status: None   Collection Time: 11/19/19 11:10 AM   Specimen: Nasopharyngeal Swab  Result Value Ref Range Status   SARS Coronavirus 2 NEGATIVE NEGATIVE Final    Comment: (NOTE) SARS-CoV-2 target nucleic acids are NOT DETECTED.  The SARS-CoV-2 RNA is generally detectable in upper and lower respiratory specimens during the acute phase of infection. The lowest concentration of SARS-CoV-2 viral copies this assay can detect is 250 copies / mL. A negative result does not preclude SARS-CoV-2 infection and should not be used as the sole basis for treatment or other patient management decisions.  A negative result may occur with improper specimen collection / handling, submission of specimen other than nasopharyngeal swab, presence of viral mutation(s) within the areas targeted by this assay, and inadequate number of viral copies (<250 copies / mL). A negative result must be combined with clinical observations, patient history, and epidemiological information.  Fact Sheet for Patients:   BoilerBrush.com.cy  Fact Sheet for Healthcare Providers: https://pope.com/  This test is not yet approved or  cleared by the [place] FDA and has been authorized for detection and/or diagnosis of SARS-CoV-2 by FDA under an Emergency Use Authorization (EUA).  This EUA will remain in effect (meaning this test can be used) for the duration of the COVID-19 declaration under Section 564(b)(1)  of the Act, 21 U.S.C. section 360bbb-3(b)(1), unless the authorization is terminated or revoked sooner.  Performed at [facility], [address], [place], [place] [address]   Blood Culture (routine x  2)     Status: None (Preliminary result)   Collection Time: 11/19/19 11:11 AM   Specimen: BLOOD LEFT FOREARM  Result Value Ref Range Status   Specimen Description BLOOD LEFT FOREARM  Final   Special Requests   Final    BOTTLES DRAWN AEROBIC AND ANAEROBIC Blood Culture results may not be optimal due to an inadequate volume of blood received in culture bottles   Culture   Final    NO GROWTH < 24 HOURS Performed at [facility], [address], [place], [place] [address]    Report Status PENDING  Incomplete  Blood Culture (routine x 2)     Status: None (Preliminary result)   Collection Time: 11/19/19 11:11 AM   Specimen: BLOOD LEFT ARM  Result Value Ref Range Status   Specimen Description BLOOD LEFT ARM  Final   Special Requests   Final    BOTTLES DRAWN AEROBIC AND ANAEROBIC Blood Culture adequate volume   Culture   Final    NO GROWTH < 24 HOURS Performed at [facility], [address] [place], [place] [address]    Report Status PENDING  Incomplete  Urine culture     Status: Abnormal (Preliminary result)   Collection Time: 11/19/19 11:33 AM   Specimen: In/Out Cath Urine  Result Value Ref Range Status   Specimen Description IN/OUT CATH URINE  Final   Special Requests   Final    NONE Performed at [facility], [address], [place], [place] [address]    Culture 70,000 COLONIES/mL GRAM NEGATIVE RODS (A)  Final   Report Status PENDING  Incomplete  MRSA PCR Screening     Status: None   Collection Time: 11/19/19  1:42 PM   Specimen: Nasal Mucosa; Nasopharyngeal  Result Value Ref Range Status   MRSA by PCR NEGATIVE NEGATIVE Final    Comment:        The GeneXpert MRSA Assay (FDA approved for NASAL specimens only), is one component of a comprehensive  MRSA colonization surveillance program. It is not intended to diagnose MRSA infection nor to guide or monitor treatment for MRSA infections. Performed at [facility], [address] [place], [place] [address]     Please note: You were cared for by a hospitalist during your hospital stay. Once you are discharged, your primary care physician will handle any further medical issues. Please note that NO REFILLS for any discharge medications will be authorized once you are discharged, as it is imperative that you return to your primary care physician (or establish a relationship with a primary care physician if you do not have one) for your post hospital discharge needs so that they can reassess your need for medications and monitor your lab values.    Time coordinating discharge: 40 minutes  SIGNED:   Burnadette Pop, MD  [facility] 11/20/2019, 2:07 PM Pager [phone]  If 7PM-7AM, please contact night-coverage [email or web address] Password [email or web address]

## 2019-11-20 NOTE — ED Notes (Signed)
Updated pt son on PTAR, IV's. He plans to stay with pt until she is transported.

## 2019-11-20 NOTE — Consult Note (Signed)
Consultation Note Date: 11/20/2019   Patient Name: Lisa Hopkins  DOB: [birth year]  MRN: [id number]  Age / Sex: 86 y.o., female  PCP: Unk Pinto, MD Referring Physician: Shelly Coss, MD  Reason for Consultation: Establishing goals of care  HPI/Patient Profile: 86 y.o. female  with past medical history of a. Fib, HTN, CVA, recent admission 5/17-5/21 for infarct resulting in L sided paralysis, dysphagia and aphasia admitted on 11/19/2019 with sepsis related to aspiration pneumonia. Palliative medicine consulted for goals of care.    Clinical Assessment and Goals of Care: Evaluated patient and met at bedside in the emergency department with patient's son, Clair Gulling.  The patient was lying in bed complaining of severe headache.  Son Clair Gulling was at bedside. I met separately in conference with Clair Gulling.  Clair Gulling reviewed the last few months of patient's medical progress.  He noted that since her discharge to [facility] from her previous hospital stay she has not made progress in her functional status.  She is did not dependent for all ADLs.  She does not eat very much and must be fed. He feels that she has had a very poor quality of life.  She was married for many years and enjoyed traveling.  Clair Gulling very easily and clearly says "I know it is time."  He feels she is suffering. We discussed that continued aggressive medical care including IV antibiotics and IV fluids may prolong Ms. Fogelman's time and may clear her of pneumonia this time but will likely not improve her overall quality of life and it is possible that her aspiration pneumonia will occur again. Options for transition to full comfort measures only including comfort medications, stopping IV fluids and IV antibiotics, and seeking placement as residential hospice were discussed. Clair Gulling very easily notes that he he and his brothers wishes for their mother are just to be  comfortable.  She had previously told them that this is not a quality of life she would wish to continue with aggressive medical care.  He would like to seek placement at the hospice house in [place].  Primary Decision Maker NEXT OF KIN- patient's son- Nyelli Samara    SUMMARY OF RECOMMENDATIONS -Transition to full comfort measures only -Stop IV fluids, antibiotics and other medications that do not contribute to comfort -Referral for placement at [facility] per family request -Comfort medications as ordered- patient will need symptom management for pain and shortness of breath     Code Status/Advance Care Planning:  DNR  Palliative Prophylaxis:   Frequent Pain Assessment  Psycho-social/Spiritual:   Desire for further Chaplaincy support:yes  Additional Recommendations: Education on Hospice  Prognosis:    < 2 weeks  Discharge Planning: Hospice facility  Primary Diagnoses: Present on Admission:  Sepsis ([place])  Essential hypertension  GERD  Hyperlipidemia  Hyponatremia   I have reviewed the medical record, interviewed the patient and family, and examined the patient. The following aspects are pertinent.  Past Medical History:  Diagnosis Date   Anxiety    Aphasia as late effect  of cerebrovascular accident    Atrial fibrillation ([place])    CKD (chronic kidney disease), stage II    CVA (cerebral infarction)    Diabetes mellitus type II    DVT, lower extremity, distal, acute, right ([place]) 03/25/2018   Right peroneal vein 03/22/2018   GERD (gastroesophageal reflux disease)    Hiatal hernia    HTN (hypertension)    Hyperlipidemia    IBS (irritable bowel syndrome)    Stroke ([facility])    Vitamin D deficiency    Social History   Socioeconomic History   Marital status: Widowed    Spouse name: Not on file   Number of children: Not on file   Years of education: Not on file   Highest education level: Not on file  Occupational History   Not  on file  Tobacco Use   Smoking status: Never Smoker   Smokeless tobacco: Never Used  Vaping Use   Vaping Use: Never used  Substance and Sexual Activity   Alcohol use: No   Drug use: No   Sexual activity: Not Currently  Other Topics Concern   Not on file  Social History Narrative   Not on file   Social Determinants of Health   Financial Resource Strain:    Difficulty of Paying Living Expenses:   Food Insecurity:    Worried About Charity fundraiser in the Last Year:    Arboriculturist in the Last Year:   Transportation Needs:    Film/video editor (Medical):    Lack of Transportation (Non-Medical):   Physical Activity:    Days of Exercise per Week:    Minutes of Exercise per Session:   Stress:    Feeling of Stress :   Social Connections:    Frequency of Communication with Friends and Family:    Frequency of Social Gatherings with Friends and Family:    Attends Religious Services:    Active Member of Clubs or Organizations:    Attends Music therapist:    Marital Status:    Family History  Problem Relation Age of Onset   Diabetes Mother    Stroke Mother    Hypertension Brother    Heart disease Brother    Arthritis Sister        Rhematoid   Arthritis Sister        Rhematoid   Scheduled Meds:  morphine  5 mg Oral NOW   Continuous Infusions: PRN Meds:.acetaminophen **OR** acetaminophen, antiseptic oral rinse, glycopyrrolate **OR** glycopyrrolate **OR** glycopyrrolate, haloperidol **OR** haloperidol **OR** haloperidol lactate, LORazepam **OR** LORazepam **OR** LORazepam, morphine CONCENTRATE **OR** morphine CONCENTRATE, ondansetron **OR** ondansetron (ZOFRAN) IV, polyvinyl alcohol Medications Prior to Admission:  Prior to Admission medications   Medication Sig Start Date End Date Taking? Authorizing Provider  aspirin EC 81 MG EC tablet Take 1 tablet (81 mg total) by mouth daily. 10/18/19  Yes Dahal, Marlowe Aschoff, MD    atorvastatin (LIPITOR) 20 MG tablet Take 1 tablet (20 mg total) by mouth daily. 10/18/19  Yes Dahal, Marlowe Aschoff, MD  Cholecalciferol (VITAMIN D3) 125 MCG (5000 UT) TABS Take 5,000-10,000 Units by mouth See admin instructions. Take two tablets (10,000 units) by mouth on Monday, Wednesday, Friday after supper, take one tablet (5,000 units) on Sunday, Tuesday, Thursday, Saturday after supper   Yes [provider]  CINNAMON PO Take 1,000 mg by mouth daily after breakfast.    Yes [provider]  dicyclomine (BENTYL) 20 MG tablet Take 1 tablet 3 x  day if needed for nausea, bloating, cramping or diarrhea 05/16/18  Yes Unk Pinto, MD  furosemide (LASIX) 20 MG tablet Take 1 tablet Daily  for BP & Fluid Retention / Ankle Swelling. Patient taking differently: Take 20 mg by mouth daily. for BP & Fluid Retention / Ankle Swelling. 04/14/19  Yes Unk Pinto, MD  loperamide (IMODIUM) 2 MG capsule Take 1 capsule Daily to Prevent Diarrhea Patient taking differently: Take 2 mg by mouth at bedtime. Take 1 capsule Daily to Prevent Diarrhea 10/27/19  Yes Unk Pinto, MD  losartan (COZAAR) 50 MG tablet Take 1 tablet Daily for BP Patient taking differently: Take 50 mg by mouth daily. for BP 07/04/19  Yes Unk Pinto, MD  melatonin 3 MG TABS tablet Take 1 tablet (3 mg total) by mouth at bedtime. 10/17/19  Yes Dahal, Marlowe Aschoff, MD  metoprolol tartrate (LOPRESSOR) 25 MG tablet Take 1/2 tablet 2 x /day for Afib Patient taking differently: Take 12.5 mg by mouth 2 (two) times daily. Take 1/2 tablet 2 x /day for Afib 10/27/19  Yes Unk Pinto, MD  nystatin (NYSTATIN) powder Apply 1 application topically 2 (two) times daily.   Yes [provider]  ondansetron (ZOFRAN ODT) 4 MG disintegrating tablet Dissolve 1 tablet under tongue every 6 to 8 hours if needed for nausea & / or vomitting 04/10/18  Yes Unk Pinto, MD  pantoprazole (PROTONIX) 40 MG tablet Take 1 tablet Daily for Heart burn  & Indigestion Patient taking differently: Take 40 mg by mouth daily after breakfast. for Heart burn & Indigestion 02/14/19  Yes Unk Pinto, MD  potassium chloride (KLOR-CON) 10 MEQ tablet Take 1 tablet daily for Potassium Patient taking differently: Take 10 mEq by mouth daily.  07/08/19  Yes Unk Pinto, MD  QUEtiapine (SEROQUEL) 25 MG tablet TAKE 1/2 TABLET AT BEDTIME. Patient taking differently: Take 12.5 mg by mouth at bedtime. Take 1/2 tablet at Bedtime 09/29/19  Yes Unk Pinto, MD  senna (SENOKOT) 8.6 MG TABS tablet Take 2 tablets by mouth at bedtime.   Yes [provider]  warfarin (COUMADIN) 2 MG tablet TAKE 1 TABLET EACH DAY EXCEPT 1/2 TABLET ON MONDAYS AND FRIDAYS OR AS DIRECTED BY ANTICOAGULATION CLINIC. Patient taking differently: Take 1 mg by mouth every evening. TAKE 1 TABLET EACH DAY EXCEPT 1/2 TABLET ON MONDAYS AND FRIDAYS OR AS DIRECTED BY ANTICOAGULATION CLINIC. 10/01/19  Yes Dorothy Spark, MD  feeding supplement, ENSURE ENLIVE, (ENSURE ENLIVE) LIQD Take 237 mLs by mouth 2 (two) times daily between meals. Patient not taking: Reported on 10/13/2019 06/20/18   Debbe Odea, MD   Allergies  Allergen Reactions   Ace Inhibitors Cough   Fosamax [Alendronate Sodium] Other (See Comments)    Heart burn   Norvasc [Amlodipine Besylate] Other (See Comments)    edema   Zocor [Simvastatin] Other (See Comments)    Elevated CPK   Review of Systems  Physical Exam  Vital Signs: BP 137/82    Pulse 94    Temp 98.1 F (36.7 C) (Oral)    Resp (!) 21    SpO2 98%  Pain Scale: Faces       SpO2: SpO2: 98 % O2 Device:SpO2: 98 % O2 Flow Rate: .O2 Flow Rate (L/min): 4 L/min  IO: Intake/output summary:   Intake/Output Summary (Last 24 hours) at 11/20/2019 1232 Last data filed at 11/20/2019 0844 Gross per 24 hour  Intake 1400 ml  Output 600 ml  Net 800 ml    LBM:   Baseline  Weight:   Most recent weight:       Palliative Assessment/Data: PPS:  20%     Thank you for this consult. Palliative medicine will continue to follow and assist as needed.   Time In: 1130 Time Out: 1240 Time Total: 70 minutes Greater than 50%  of this time was spent counseling and coordinating care related to the above assessment and plan.  Signed by: Mariana Kaufman, AGNP-C Palliative Medicine    Please contact Palliative Medicine Team phone at [phone] for questions and concerns.  For individual provider: See Shea Evans

## 2019-11-20 NOTE — ED Notes (Signed)
Pt will be traveling to [facility] via [place] Petty, Judeth Cornfield at [facility] and treatment team here agree that is best for pt since she has swallowing issues. PTAR contacted and report called to [facility].

## 2019-11-20 NOTE — ED Notes (Signed)
Patient verbalizes understanding of discharge instructions. Opportunity for questioning and answers were provided. Armband removed by staff, pt discharged from ED. Pt. ambulatory and discharged home.  

## 2019-11-21 LAB — URINE CULTURE: Culture: 70000 — AB

## 2019-11-23 ENCOUNTER — Telehealth: Payer: Self-pay | Admitting: Emergency Medicine

## 2019-11-23 NOTE — Telephone Encounter (Signed)
Post ED Visit - Positive Culture Follow-up  Culture report reviewed by antimicrobial stewardship pharmacist: [facility] []  , Pharm.D. []  Enzo Bi, .D., BCPS AQ-ID []  Celedonio Miyamoto, Pharm.D., BCPS []  [address], Pharm.D., BCPS []  Wykoff, Garvin Fila.D., BCPS, AAHIVP []  , Pharm.D., BCPS, AAHIVP []  Georgina Pillion, PharmD, BCPS []  , PharmD, BCPS []  [place], PharmD, BCPS []  [address], PharmD []  , PharmD, BCPS [x]  Estella Husk, PharmD  Pharmacy Team []  Lysle Pearl, PharmD []  , PharmD []  Phillips Climes, PharmD []  , Rph []  Agapito Games) , PharmD []  Verlan Friends, PharmD []  , PharmD []  Mervyn Gay, PharmD []  , PharmD []  Vinnie Level, PharmD []  [facility], PharmD []  , PharmD []  Len Childs, PharmD   Positive urine culture No further patient follow-up is required at this time.  Francille Wittmann 11/23/2019, 10:58 AM

## 2019-11-24 LAB — CULTURE, BLOOD (ROUTINE X 2)
Culture: NO GROWTH
Culture: NO GROWTH
Special Requests: ADEQUATE

## 2019-12-28 DEATH — deceased

## 2020-02-04 ENCOUNTER — Ambulatory Visit: Payer: Medicare Other | Admitting: Internal Medicine

## 2020-04-12 ENCOUNTER — Ambulatory Visit: Payer: Medicare Other | Admitting: Adult Health

## 2020-07-27 ENCOUNTER — Encounter: Payer: Medicare Other | Admitting: Adult Health

## 2020-07-28 ENCOUNTER — Encounter: Payer: Medicare Other | Admitting: Adult Health

## 2021-02-18 IMAGING — DX DG CHEST 1V PORT
1 series · 1 of 1 positions shown · non-contrast
Comparison: Chest x-ray dated 07/18/2019 and chest CT dated
03/22/2018

CLINICAL DATA: Cough and rhonchi in the lower lobes. Low O2
saturation. Possible aspiration.

EXAM:
PORTABLE CHEST 1 VIEW

[chest ap]
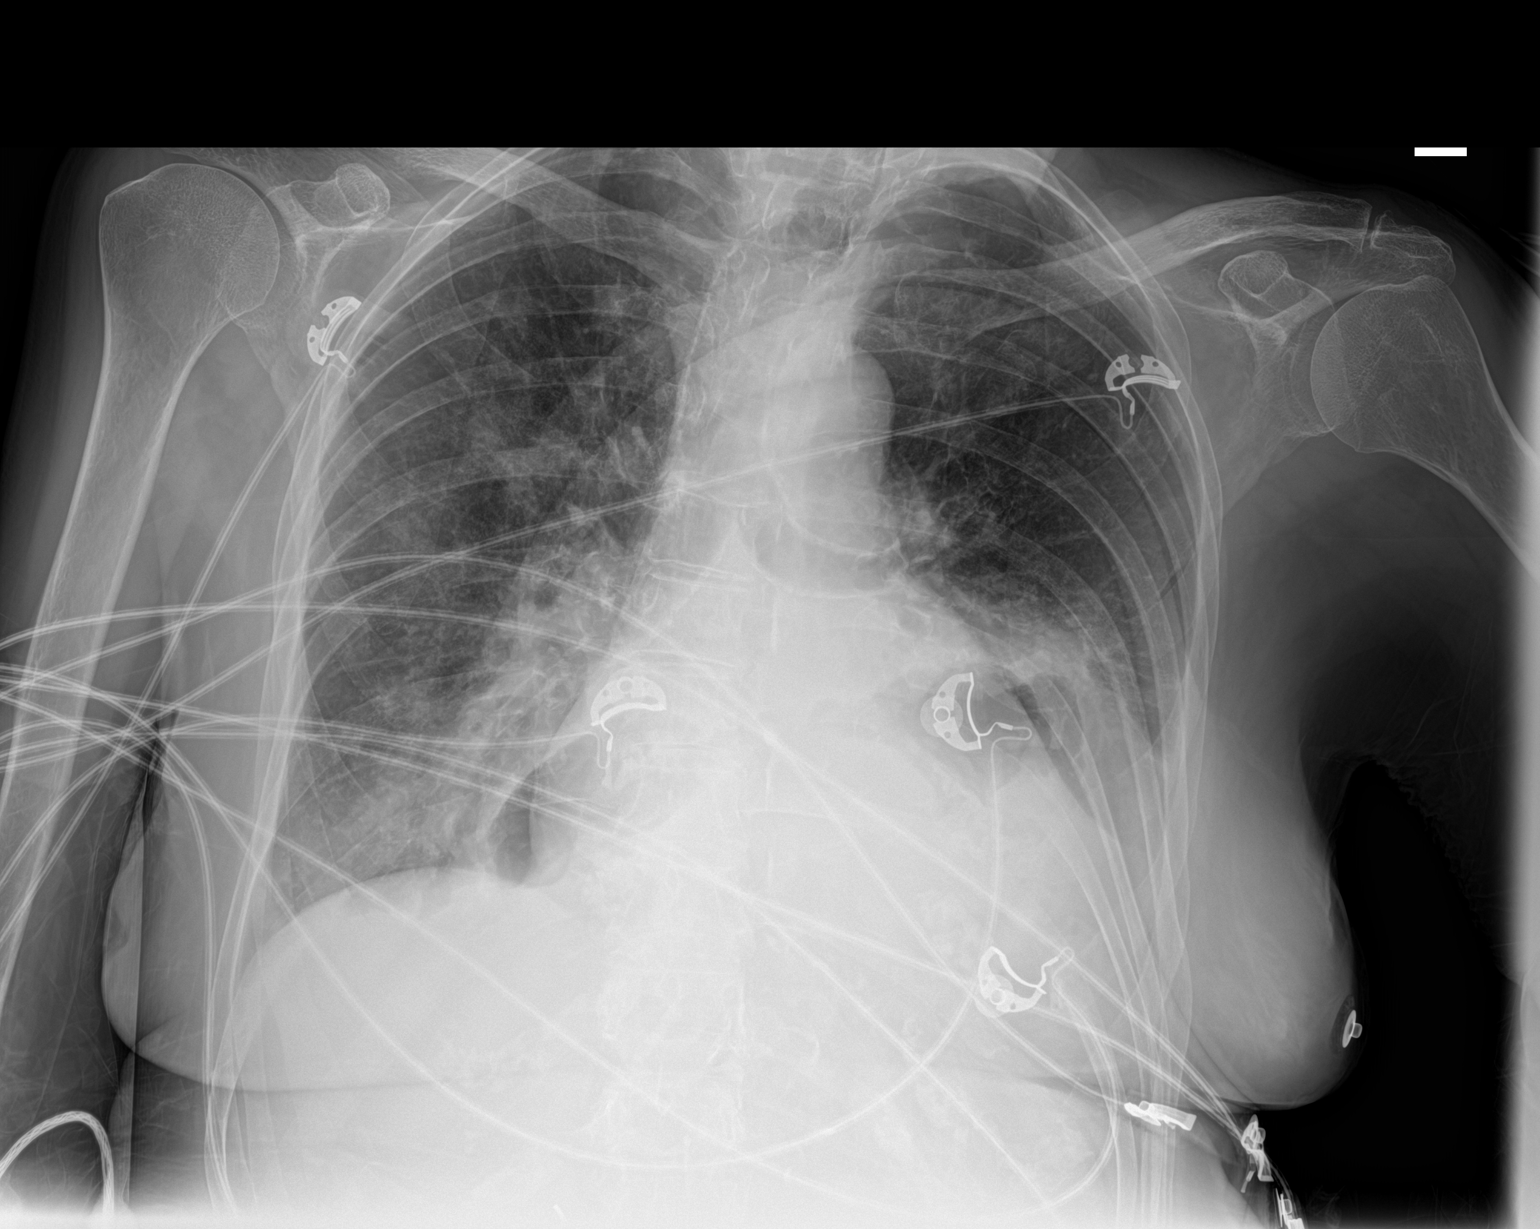

[1 of 1 positions shown; findings below may reference images not displayed]

FINDINGS: There are small patchy infiltrates in the right midzone and at the
right base which could represent aspiration pneumonitis.

The patient has a huge chronic hiatal hernia with secondary slight
compressive atelectasis at the left lung base. Heart size and
pulmonary vascularity are normal considering the AP portable
technique.

Aortic atherosclerosis.  No acute bone abnormality.
IMPRESSION: Possible small patchy aspiration pneumonitis in the right midzone
and at the right base.

Aortic Atherosclerosis ([id number]).

Huge chronic hiatal hernia.
